# Patient Record
Sex: Male | Born: 1945 | Race: White | Hispanic: No | Marital: Married | Smoking: Former smoker
Health system: Southern US, Community
[De-identification: ages and names within clinical notes are randomized; demographics above are authoritative.]

## PROBLEM LIST (undated history)

## (undated) DIAGNOSIS — I2510 Atherosclerotic heart disease of native coronary artery without angina pectoris: Secondary | ICD-10-CM

## (undated) DIAGNOSIS — I471 Supraventricular tachycardia, unspecified: Secondary | ICD-10-CM

## (undated) DIAGNOSIS — Z9109 Other allergy status, other than to drugs and biological substances: Secondary | ICD-10-CM

## (undated) DIAGNOSIS — Z7982 Long term (current) use of aspirin: Secondary | ICD-10-CM

## (undated) DIAGNOSIS — F32A Depression, unspecified: Secondary | ICD-10-CM

## (undated) DIAGNOSIS — I10 Essential (primary) hypertension: Secondary | ICD-10-CM

## (undated) DIAGNOSIS — M51369 Other intervertebral disc degeneration, lumbar region without mention of lumbar back pain or lower extremity pain: Secondary | ICD-10-CM

## (undated) DIAGNOSIS — M1990 Unspecified osteoarthritis, unspecified site: Secondary | ICD-10-CM

## (undated) DIAGNOSIS — Z9841 Cataract extraction status, right eye: Secondary | ICD-10-CM

## (undated) DIAGNOSIS — I639 Cerebral infarction, unspecified: Secondary | ICD-10-CM

## (undated) DIAGNOSIS — K5790 Diverticulosis of intestine, part unspecified, without perforation or abscess without bleeding: Secondary | ICD-10-CM

## (undated) DIAGNOSIS — K219 Gastro-esophageal reflux disease without esophagitis: Secondary | ICD-10-CM

## (undated) DIAGNOSIS — E7800 Pure hypercholesterolemia, unspecified: Secondary | ICD-10-CM

## (undated) DIAGNOSIS — E291 Testicular hypofunction: Secondary | ICD-10-CM

## (undated) DIAGNOSIS — I5022 Chronic systolic (congestive) heart failure: Secondary | ICD-10-CM

## (undated) DIAGNOSIS — N138 Other obstructive and reflux uropathy: Secondary | ICD-10-CM

## (undated) DIAGNOSIS — E119 Type 2 diabetes mellitus without complications: Secondary | ICD-10-CM

## (undated) DIAGNOSIS — I480 Paroxysmal atrial fibrillation: Secondary | ICD-10-CM

## (undated) DIAGNOSIS — F419 Anxiety disorder, unspecified: Secondary | ICD-10-CM

## (undated) DIAGNOSIS — I428 Other cardiomyopathies: Secondary | ICD-10-CM

## (undated) DIAGNOSIS — H269 Unspecified cataract: Secondary | ICD-10-CM

## (undated) DIAGNOSIS — I499 Cardiac arrhythmia, unspecified: Secondary | ICD-10-CM

## (undated) DIAGNOSIS — A0472 Enterocolitis due to Clostridium difficile, not specified as recurrent: Secondary | ICD-10-CM

## (undated) DIAGNOSIS — J189 Pneumonia, unspecified organism: Secondary | ICD-10-CM

## (undated) DIAGNOSIS — G4700 Insomnia, unspecified: Secondary | ICD-10-CM

## (undated) DIAGNOSIS — K5792 Diverticulitis of intestine, part unspecified, without perforation or abscess without bleeding: Secondary | ICD-10-CM

## (undated) DIAGNOSIS — I4729 Other ventricular tachycardia: Secondary | ICD-10-CM

## (undated) DIAGNOSIS — E049 Nontoxic goiter, unspecified: Secondary | ICD-10-CM

## (undated) DIAGNOSIS — I6789 Other cerebrovascular disease: Secondary | ICD-10-CM

## (undated) DIAGNOSIS — I700 Atherosclerosis of aorta: Secondary | ICD-10-CM

## (undated) DIAGNOSIS — I447 Left bundle-branch block, unspecified: Secondary | ICD-10-CM

## (undated) DIAGNOSIS — F329 Major depressive disorder, single episode, unspecified: Secondary | ICD-10-CM

## (undated) DIAGNOSIS — I5020 Unspecified systolic (congestive) heart failure: Secondary | ICD-10-CM

## (undated) DIAGNOSIS — Z5189 Encounter for other specified aftercare: Secondary | ICD-10-CM

## (undated) DIAGNOSIS — I4891 Unspecified atrial fibrillation: Secondary | ICD-10-CM

## (undated) DIAGNOSIS — R112 Nausea with vomiting, unspecified: Secondary | ICD-10-CM

## (undated) DIAGNOSIS — G459 Transient cerebral ischemic attack, unspecified: Secondary | ICD-10-CM

## (undated) DIAGNOSIS — G4733 Obstructive sleep apnea (adult) (pediatric): Secondary | ICD-10-CM

## (undated) DIAGNOSIS — G4730 Sleep apnea, unspecified: Secondary | ICD-10-CM

## (undated) DIAGNOSIS — N400 Enlarged prostate without lower urinary tract symptoms: Secondary | ICD-10-CM

## (undated) DIAGNOSIS — Z9889 Other specified postprocedural states: Secondary | ICD-10-CM

## (undated) HISTORY — DX: Nontoxic goiter, unspecified: E04.9

## (undated) HISTORY — PX: NOSE SURGERY: SHX723

## (undated) HISTORY — DX: Depression, unspecified: F32.A

## (undated) HISTORY — DX: Unspecified osteoarthritis, unspecified site: M19.90

## (undated) HISTORY — DX: Pure hypercholesterolemia, unspecified: E78.00

## (undated) HISTORY — DX: Enterocolitis due to Clostridium difficile, not specified as recurrent: A04.72

## (undated) HISTORY — DX: Gastro-esophageal reflux disease without esophagitis: K21.9

## (undated) HISTORY — DX: Other cardiomyopathies: I42.8

## (undated) HISTORY — DX: Diverticulosis of intestine, part unspecified, without perforation or abscess without bleeding: K57.90

## (undated) HISTORY — DX: Transient cerebral ischemic attack, unspecified: G45.9

## (undated) HISTORY — DX: Essential (primary) hypertension: I10

## (undated) HISTORY — DX: Atherosclerotic heart disease of native coronary artery without angina pectoris: I25.10

## (undated) HISTORY — DX: Encounter for other specified aftercare: Z51.89

## (undated) HISTORY — DX: Other allergy status, other than to drugs and biological substances: Z91.09

## (undated) HISTORY — PX: EYE SURGERY: SHX253

## (undated) HISTORY — DX: Unspecified atrial fibrillation: I48.91

## (undated) HISTORY — PX: OTHER SURGICAL HISTORY: SHX169

## (undated) HISTORY — DX: Unspecified cataract: H26.9

## (undated) HISTORY — DX: Major depressive disorder, single episode, unspecified: F32.9

## (undated) HISTORY — DX: Type 2 diabetes mellitus without complications: E11.9

## (undated) HISTORY — DX: Paroxysmal atrial fibrillation: I48.0

## (undated) HISTORY — PX: SPINE SURGERY: SHX786

## (undated) HISTORY — DX: Chronic systolic (congestive) heart failure: I50.22

---

## 1973-05-12 HISTORY — PX: SEPTOPLASTY: SUR1290

## 1989-05-12 HISTORY — PX: INGUINAL HERNIA REPAIR: SUR1180

## 1998-05-12 HISTORY — PX: SHOULDER SURGERY: SHX246

## 2000-05-12 DIAGNOSIS — I480 Paroxysmal atrial fibrillation: Secondary | ICD-10-CM

## 2000-05-12 HISTORY — DX: Paroxysmal atrial fibrillation: I48.0

## 2000-05-12 HISTORY — PX: CERVICAL DISC SURGERY: SHX588

## 2004-08-26 ENCOUNTER — Ambulatory Visit: Payer: Self-pay | Admitting: Unknown Physician Specialty

## 2004-09-18 ENCOUNTER — Ambulatory Visit: Payer: Self-pay | Admitting: Unknown Physician Specialty

## 2006-09-18 ENCOUNTER — Ambulatory Visit: Payer: Self-pay | Admitting: Unknown Physician Specialty

## 2006-11-11 ENCOUNTER — Ambulatory Visit: Payer: Self-pay | Admitting: Unknown Physician Specialty

## 2006-12-01 ENCOUNTER — Inpatient Hospital Stay: Payer: Self-pay | Admitting: Unknown Physician Specialty

## 2006-12-11 HISTORY — PX: BACK SURGERY: SHX140

## 2007-10-23 ENCOUNTER — Emergency Department: Payer: Self-pay | Admitting: Emergency Medicine

## 2007-12-28 ENCOUNTER — Encounter: Admission: RE | Admit: 2007-12-28 | Discharge: 2007-12-28 | Payer: Self-pay | Admitting: Surgery

## 2008-02-02 ENCOUNTER — Ambulatory Visit: Payer: Self-pay | Admitting: Unknown Physician Specialty

## 2008-02-22 ENCOUNTER — Other Ambulatory Visit: Admission: RE | Admit: 2008-02-22 | Discharge: 2008-02-22 | Payer: Self-pay | Admitting: Interventional Radiology

## 2008-02-22 ENCOUNTER — Encounter: Admission: RE | Admit: 2008-02-22 | Discharge: 2008-02-22 | Payer: Self-pay | Admitting: Surgery

## 2008-02-22 ENCOUNTER — Encounter (INDEPENDENT_AMBULATORY_CARE_PROVIDER_SITE_OTHER): Payer: Self-pay | Admitting: Interventional Radiology

## 2008-07-15 ENCOUNTER — Ambulatory Visit: Payer: Self-pay | Admitting: Emergency Medicine

## 2009-02-27 ENCOUNTER — Encounter: Admission: RE | Admit: 2009-02-27 | Discharge: 2009-02-27 | Payer: Self-pay | Admitting: Surgery

## 2009-05-12 HISTORY — PX: CATARACT EXTRACTION: SUR2

## 2009-06-15 ENCOUNTER — Ambulatory Visit: Payer: Self-pay | Admitting: Internal Medicine

## 2010-01-25 ENCOUNTER — Ambulatory Visit: Payer: Self-pay | Admitting: Internal Medicine

## 2010-05-21 ENCOUNTER — Encounter
Admission: RE | Admit: 2010-05-21 | Discharge: 2010-05-21 | Payer: Self-pay | Source: Home / Self Care | Attending: Internal Medicine | Admitting: Internal Medicine

## 2010-06-03 ENCOUNTER — Encounter: Payer: Self-pay | Admitting: Surgery

## 2010-07-10 ENCOUNTER — Other Ambulatory Visit: Payer: Self-pay | Admitting: *Deleted

## 2010-07-25 ENCOUNTER — Other Ambulatory Visit: Payer: Self-pay | Admitting: *Deleted

## 2010-07-25 DIAGNOSIS — R911 Solitary pulmonary nodule: Secondary | ICD-10-CM

## 2010-07-31 ENCOUNTER — Ambulatory Visit
Admission: RE | Admit: 2010-07-31 | Discharge: 2010-07-31 | Disposition: A | Discharge status: Home / Self Care | Payer: Medicare Other | Source: Ambulatory Visit | Attending: *Deleted | Admitting: *Deleted

## 2010-07-31 DIAGNOSIS — R911 Solitary pulmonary nodule: Secondary | ICD-10-CM

## 2010-07-31 MED ORDER — IOHEXOL 300 MG/ML  SOLN
75.0000 mL | Freq: Once | INTRAMUSCULAR | Status: AC | PRN
  Administered 2010-07-31: 75 mL via INTRAVENOUS

## 2010-09-20 ENCOUNTER — Emergency Department: Payer: Self-pay | Admitting: Emergency Medicine

## 2010-12-31 ENCOUNTER — Ambulatory Visit: Payer: Self-pay | Admitting: Internal Medicine

## 2011-07-18 LAB — HM COLONOSCOPY

## 2011-07-21 ENCOUNTER — Ambulatory Visit: Payer: Self-pay | Admitting: Unknown Physician Specialty

## 2011-07-21 LAB — HM COLONOSCOPY

## 2011-07-22 LAB — PATHOLOGY REPORT

## 2012-04-02 ENCOUNTER — Encounter: Payer: Self-pay | Admitting: Internal Medicine

## 2012-04-02 ENCOUNTER — Ambulatory Visit (INDEPENDENT_AMBULATORY_CARE_PROVIDER_SITE_OTHER): Payer: Medicare Other | Admitting: Internal Medicine

## 2012-04-02 VITALS — BP 139/74 | HR 61 | Temp 98.2°F | Ht 68.0 in | Wt 210.0 lb

## 2012-04-02 DIAGNOSIS — G459 Transient cerebral ischemic attack, unspecified: Secondary | ICD-10-CM | POA: Insufficient documentation

## 2012-04-02 DIAGNOSIS — K219 Gastro-esophageal reflux disease without esophagitis: Secondary | ICD-10-CM | POA: Insufficient documentation

## 2012-04-02 DIAGNOSIS — M1990 Unspecified osteoarthritis, unspecified site: Secondary | ICD-10-CM

## 2012-04-02 DIAGNOSIS — I639 Cerebral infarction, unspecified: Secondary | ICD-10-CM | POA: Insufficient documentation

## 2012-04-02 DIAGNOSIS — E119 Type 2 diabetes mellitus without complications: Secondary | ICD-10-CM

## 2012-04-02 DIAGNOSIS — E049 Nontoxic goiter, unspecified: Secondary | ICD-10-CM

## 2012-04-02 DIAGNOSIS — I10 Essential (primary) hypertension: Secondary | ICD-10-CM

## 2012-04-02 DIAGNOSIS — E7800 Pure hypercholesterolemia, unspecified: Secondary | ICD-10-CM

## 2012-04-02 DIAGNOSIS — I6350 Cerebral infarction due to unspecified occlusion or stenosis of unspecified cerebral artery: Secondary | ICD-10-CM

## 2012-04-02 MED ORDER — ALPRAZOLAM 0.25 MG PO TABS: 0.2500 mg | ORAL_TABLET | Freq: Every day | ORAL | Status: DC | PRN

## 2012-04-02 NOTE — Assessment & Plan Note (Addendum)
Sees Dr Kernodle.  Currently stable.  Follow.  Just had injection in his right shoulder (Dr Barnes).  Doing better.  Follow.     

## 2012-04-02 NOTE — Progress Notes (Signed)
Subjective:    Patient ID: Ricardo Nash, male    DOB: 09-26-1945, 66 y.o.   MRN: [id number]  HPI 66 year old male with past history of diabetes, hypertension, hypercholesterolemia and previously presumed TIA who comes in today for a scheduled follow up.  States he is doing well.  Has seen Dr Zachery Dauer for his right shoulder.  Shoulder doing better.  Injection helped.  Saw Dr Lady Gary earlier this month.  Heart checked out fine.  Had his flu shot.  Just had his eyes checked.  Saw Dr Cheree Ditto and had a skin survey.  She removed an "abnormal mole from his left anterior chest. He will continue to follow up with her with yearly skin surveys.  He is exercising 4-5x/week.  Aerobic exercise and lifting weights.  Breathing is doing well.  Back doing ok.  Some minimal discomfort at times, but overall doing relatively well.  Sugars averaging 120-140 in the am and 80-100 in the pm.  Blood pressures averaging 130s/60s.   Past Medical History  Diagnosis Date  . Hypertension   . Diabetes mellitus   . Hypercholesterolemia   . Environmental allergies   . TIA (transient ischemic attack)   . Depression   . GERD (gastroesophageal reflux disease)   . Osteoarthritis     cervical spine, lumbar spine  . Paroxysmal atrial fibrillation   . C. difficile colitis   . Goiter     intrathoracic, s/p benign biopsy (Dr Gerrit Friends)  . Diverticulosis     Review of Systems Patient denies any headache, lightheadedness or dizziness.  No significant sinus symptoms currently.  Still has some drainage, but overall doing well.  No chest pain, tightness or palpitations.  No increased shortness of breath, cough or congestion.  No acid reflux.  No nausea or vomiting.  No abdominal pain or cramping.  No bowel change, such as diarrhea, constipation, BRBPR or melana.  No urine change.  Overall he feels he is doing well.     Objective:   Physical Exam Filed Vitals:   04/02/12 0805  BP: 139/74  Pulse: 61  Temp: 98.2 F (30.52 C)   66  year old male in no acute distress.   HEENT:  Nares - clear.  OP- without lesions or erythema.  NECK:  Supple, nontender.  No audible bruit.   HEART:  Appears to be regular. LUNGS:  Without crackles or wheezing audible.  Respirations even and unlabored.   RADIAL PULSE:  Equal bilaterally.  ABDOMEN:  Soft, nontender.  No audible abdominal bruit.   EXTREMITIES:  No increased edema to be present.  Feet without lesions.  DP pulses palpable and equal bilaterally.                   Assessment & Plan:  GI.  Bowels doing better on fiber.  Colonoscopy 07/21/11 revealed diverticulosis and internal hemorrhoids.  EGD with gastritis.  Upper symptoms controlled on Protonix.  Follow.   ADRENAL GLAND ENLARGEMENT.  Had a lipomatous lesion in the central portion of the left adrenal gland.  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    CARDIOVASCULAR.  Stable.  Asymptomatic.  Continue follow up with Dr. Lady Gary.  Continue risk factor modification.    GU.  Sees Dr Achilles Dunk.  He follows PSAs.    PULMONARY.  Sees Dr Meredeth Ide.  Recent CT chest revealed no acute abnormality.  Recommended yearly follow up.   INCREASED PSYCHOSOCIAL STRESSORS.  Uses Xanax occasionally.  Follow.   HEALTH MAINTENANCE.  Last physical 05/22/11.  Colonoscopy 07/21/11 - diverticulosis and internal hemorrhoids.  PSAs followed through Dr Achilles Dunk.

## 2012-04-02 NOTE — Assessment & Plan Note (Signed)
Previously saw Dr Gerkin.  Had an ultrasound.  Dr Gerkin felt - stable (actually smaller).  S/P biopsy.  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  Cytopathology showed follicular epithelial cells without atypia.  Follow.    

## 2012-04-02 NOTE — Assessment & Plan Note (Signed)
EGD revealed gastritis.  Currently on Protonix.  Symptoms controlled.   

## 2012-04-02 NOTE — Assessment & Plan Note (Addendum)
Check sugars twice a day.  Up to date with eye checks.  A1c checked 12/31/11 - 6.9.  Follow.  Check metabolic panel and a1c with next labs.

## 2012-04-02 NOTE — Patient Instructions (Addendum)
It was nice seeing you today.  I am glad you are doing well.  Let me know if you need anything.  

## 2012-04-02 NOTE — Assessment & Plan Note (Signed)
Maintained on Plavix.  No reoccurring problems.  Follow.

## 2012-04-02 NOTE — Assessment & Plan Note (Signed)
Maintained on Plavix.  Currently asymptomatic.  Follow.   

## 2012-04-03 ENCOUNTER — Encounter: Payer: Self-pay | Admitting: Internal Medicine

## 2012-04-03 NOTE — Assessment & Plan Note (Signed)
Fasting lipid profile 12/31/11 revealed total cholesterol 132, HDL 35, LDL 69 and triglycerides 145.  Liver panel wnl.  Continue low cholesterol diet and exercise.  Continue current med regimen.  Check lipid profile and liver function with next fasting labs.

## 2012-04-03 NOTE — Assessment & Plan Note (Signed)
Blood pressure is under good control.  Same meds.  Check metabolic panel with next labs.    

## 2012-05-03 ENCOUNTER — Other Ambulatory Visit: Payer: Self-pay | Admitting: Internal Medicine

## 2012-05-03 MED ORDER — ATORVASTATIN CALCIUM 10 MG PO TABS: 10.0000 mg | ORAL_TABLET | Freq: Every day | ORAL | Status: DC

## 2012-05-03 NOTE — Telephone Encounter (Signed)
Sent in to pharmacy.  

## 2012-05-03 NOTE — Telephone Encounter (Signed)
Atorvastatin Calcium 10 mg tab  Take 1 tablet by mouth each evening for cholesterol  # 30

## 2012-05-24 DIAGNOSIS — N529 Male erectile dysfunction, unspecified: Secondary | ICD-10-CM | POA: Insufficient documentation

## 2012-05-24 DIAGNOSIS — E291 Testicular hypofunction: Secondary | ICD-10-CM | POA: Insufficient documentation

## 2012-05-24 DIAGNOSIS — N401 Enlarged prostate with lower urinary tract symptoms: Secondary | ICD-10-CM | POA: Insufficient documentation

## 2012-06-01 ENCOUNTER — Other Ambulatory Visit: Payer: Self-pay | Admitting: Internal Medicine

## 2012-06-01 MED ORDER — AMLODIPINE BESYLATE 10 MG PO TABS: 10.0000 mg | ORAL_TABLET | Freq: Every day | ORAL | Status: DC

## 2012-06-01 NOTE — Telephone Encounter (Signed)
Prescription faxed to pharmacy.

## 2012-06-01 NOTE — Telephone Encounter (Signed)
amLODipine (NORVASC) 10 MG tablet  # 30

## 2012-06-14 ENCOUNTER — Telehealth: Payer: Self-pay | Admitting: Internal Medicine

## 2012-06-14 NOTE — Telephone Encounter (Signed)
Pt called he went to pharmcy to get liptor refill.  The pharmacy had note stating he needed to talk to dr scott. Pt has enough left for 2 days [facility] in [place] His cpx appointment is 2/27

## 2012-06-15 MED ORDER — ATORVASTATIN CALCIUM 10 MG PO TABS: 10.0000 mg | ORAL_TABLET | Freq: Every day | ORAL | Status: DC

## 2012-06-15 NOTE — Telephone Encounter (Signed)
I refilled lipitor. Sent it in to [facility].  Please notify pt and tell him what pharmacist said.

## 2012-06-15 NOTE — Telephone Encounter (Signed)
I sent in refill lipitor #30 with 5 refills to [facility].

## 2012-06-15 NOTE — Telephone Encounter (Signed)
Spoke to pharmacist at [facility]. He said that did not have a note for patient. However did need refills. Do you want to refill now or wait until after patient has labs?

## 2012-06-15 NOTE — Telephone Encounter (Addendum)
Called patient to let patient know.

## 2012-06-17 ENCOUNTER — Other Ambulatory Visit: Payer: Self-pay | Admitting: *Deleted

## 2012-06-18 MED ORDER — HYDROCHLOROTHIAZIDE 25 MG PO TABS: 25.0000 mg | ORAL_TABLET | Freq: Every day | ORAL | Status: DC

## 2012-06-18 MED ORDER — CLOPIDOGREL BISULFATE 75 MG PO TABS: 75.0000 mg | ORAL_TABLET | Freq: Every day | ORAL | Status: DC

## 2012-06-30 ENCOUNTER — Other Ambulatory Visit (INDEPENDENT_AMBULATORY_CARE_PROVIDER_SITE_OTHER): Payer: 59

## 2012-06-30 DIAGNOSIS — I10 Essential (primary) hypertension: Secondary | ICD-10-CM

## 2012-06-30 DIAGNOSIS — E119 Type 2 diabetes mellitus without complications: Secondary | ICD-10-CM

## 2012-06-30 DIAGNOSIS — E049 Nontoxic goiter, unspecified: Secondary | ICD-10-CM

## 2012-06-30 DIAGNOSIS — E7800 Pure hypercholesterolemia, unspecified: Secondary | ICD-10-CM

## 2012-06-30 DIAGNOSIS — G459 Transient cerebral ischemic attack, unspecified: Secondary | ICD-10-CM

## 2012-06-30 LAB — CBC WITH DIFFERENTIAL/PLATELET
Basophils Absolute: 0 10*3/uL (ref 0.0–0.1)
Eosinophils Relative: 0.8 % (ref 0.0–5.0)
MCV: 90.1 fl (ref 78.0–100.0)
Monocytes Absolute: 0.4 10*3/uL (ref 0.1–1.0)
Neutrophils Relative %: 71.7 % (ref 43.0–77.0)
Platelets: 241 10*3/uL (ref 150.0–400.0)
RDW: 13.1 % (ref 11.5–14.6)
WBC: 8.8 10*3/uL (ref 4.5–10.5)

## 2012-06-30 LAB — BASIC METABOLIC PANEL
CO2: 28 mEq/L (ref 19–32)
Calcium: 10.1 mg/dL (ref 8.4–10.5)
Creatinine, Ser: 1 mg/dL (ref 0.4–1.5)
GFR: 78.06 mL/min (ref >60.00)
Sodium: 141 mEq/L (ref 135–145)

## 2012-06-30 LAB — HEPATIC FUNCTION PANEL
Alkaline Phosphatase: 35 U/L — ABNORMAL LOW (ref 39–117)
Bilirubin, Direct: 0.2 mg/dL (ref 0.0–0.3)
Total Bilirubin: 1.2 mg/dL (ref 0.3–1.2)
Total Protein: 7 g/dL (ref 6.0–8.3)

## 2012-06-30 LAB — HEMOGLOBIN A1C: Hgb A1c MFr Bld: 7.1 % — ABNORMAL HIGH (ref 4.6–6.5)

## 2012-06-30 LAB — LIPID PANEL
Cholesterol: 129 mg/dL (ref 0–200)
HDL: 38.8 mg/dL — ABNORMAL LOW (ref >39.00)
LDL Cholesterol: 54 mg/dL (ref 0–99)
VLDL: 36.4 mg/dL (ref 0.0–40.0)

## 2012-06-30 LAB — TSH: TSH: 0.82 u[IU]/mL (ref 0.35–5.50)

## 2012-07-01 ENCOUNTER — Telehealth: Payer: Self-pay | Admitting: Internal Medicine

## 2012-07-01 NOTE — Telephone Encounter (Signed)
Pt notified of labs via my chart.  

## 2012-07-08 ENCOUNTER — Encounter: Payer: Self-pay | Admitting: Internal Medicine

## 2012-07-08 ENCOUNTER — Ambulatory Visit (INDEPENDENT_AMBULATORY_CARE_PROVIDER_SITE_OTHER): Payer: 59 | Admitting: Internal Medicine

## 2012-07-08 VITALS — BP 122/60 | HR 62 | Temp 98.0°F | Ht 68.0 in | Wt 216.2 lb

## 2012-07-08 DIAGNOSIS — G4730 Sleep apnea, unspecified: Secondary | ICD-10-CM

## 2012-07-08 MED ORDER — ALPRAZOLAM 0.25 MG PO TABS: 0.2500 mg | ORAL_TABLET | Freq: Every day | ORAL | Status: DC | PRN

## 2012-07-15 ENCOUNTER — Telehealth: Payer: Self-pay | Admitting: Internal Medicine

## 2012-07-15 MED ORDER — METOPROLOL SUCCINATE ER 25 MG PO TB24: 25.0000 mg | ORAL_TABLET | Freq: Every day | ORAL | Status: DC

## 2012-07-15 NOTE — Telephone Encounter (Signed)
Refilled metoprolol 

## 2012-07-19 ENCOUNTER — Encounter: Payer: Self-pay | Admitting: Internal Medicine

## 2012-07-19 NOTE — Assessment & Plan Note (Signed)
Blood pressure is under good control.  Same meds.  Check metabolic panel with next labs.

## 2012-07-19 NOTE — Assessment & Plan Note (Signed)
Continue CPAP.  Plan autotitration as outlined.

## 2012-07-19 NOTE — Progress Notes (Signed)
Subjective:    Patient ID: Ricardo Nash, male    DOB: 01/24/1946, 67 y.o.   MRN: [id number]  HPI 67 year old male with past history of diabetes, hypertension, hypercholesterolemia and previously presumed TIA who comes in today to follow up on these issues as well as for a complete physical exam.  States he is doing well.  Breathing is doing well.  Back doing ok.  Some minimal discomfort at times, but overall doing relatively well.  Sugars have been a little elevated recently but lately averaging 120-140 in the am and 80-110 in the pm.  Improved from previous checks.  Blood pressures averaging 130s/60s.  Some decreased hearing.  No pain.  Some left side sinus tenderness.  Dry mouth.  Notices mostly at night.  No nausea or vomiting.  No acid reflux.   Past Medical History  Diagnosis Date  . Hypertension   . Diabetes mellitus   . Hypercholesterolemia   . Environmental allergies   . TIA (transient ischemic attack)   . Depression   . GERD (gastroesophageal reflux disease)   . Osteoarthritis     cervical spine, lumbar spine  . Paroxysmal atrial fibrillation   . C. difficile colitis   . Goiter     intrathoracic, s/p benign biopsy (Dr Gerrit Friends)  . Diverticulosis     Review of Systems Patient denies any headache, lightheadedness or dizziness.  Some sinus congestion.  No chest pain, tightness or palpitations.  No increased shortness of breath, cough or congestion.  No acid reflux.  No nausea or vomiting.  No abdominal pain or cramping.  No bowel change, such as diarrhea, constipation, BRBPR or melana.  No urine change.  Overall he feels he is doing well.  Sugars doing better now.       Objective:   Physical Exam  Filed Vitals:   07/08/12 0806  BP: 122/60  Pulse: 62  Temp: 98 F (56.47 C)   67 year old male in no acute distress.  HEENT:  Nares - clear.  Oropharynx - without lesions. NECK:  Supple.  Nontender.  No audible carotid bruit.  HEART:  Appears to be regular.   LUNGS:  No  crackles or wheezing audible.  Respirations even and unlabored.   RADIAL PULSE:  Equal bilaterally.  ABDOMEN:  Soft.  Nontender.  Bowel sounds present and normal.  No audible abdominal bruit.  GU:  Performed by Dr Sharyl Nimrod.   EXTREMITIES:  No increased edema present.  DP pulses palpable and equal bilaterally.             Assessment & Plan:  GI.  Bowels doing better.  Colonoscopy 07/21/11 revealed diverticulosis and internal hemorrhoids.  EGD with gastritis.  Upper symptoms controlled on Protonix.  Follow.   ADRENAL GLAND ENLARGEMENT.  Had a lipomatous lesion in the central portion of the left adrenal gland.  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    CARDIOVASCULAR.  Stable.  Asymptomatic.  Continue follow up with Dr. Lady Gary.  Continue risk factor modification.    GU.  Sees Dr Achilles Dunk.  He follows PSAs.    PULMONARY.  Sees Dr Meredeth Ide.  Recent CT chest revealed no acute abnormality.  Recommended yearly follow up.   INCREASED PSYCHOSOCIAL STRESSORS.  Uses Xanax occasionally.  Follow.   POSSIBLE SINUSITIS.  Zpak as directed.  Saline nasal flushes.  Flonase as directed.  mucinex as directed.  Follow.   DRY MOUTH.  Is  waking up at night and has noticed increased dry mouth.  Will arrange for autotitration.  Has know sleep apnea.   HEALTH MAINTENANCE.  Physical today.  Colonoscopy 07/21/11 - diverticulosis and internal hemorrhoids.  PSAs followed through Dr Achilles Dunk.

## 2012-07-19 NOTE — Assessment & Plan Note (Signed)
Previously saw Dr Gerkin.  Had an ultrasound.  Dr Gerkin felt - stable (actually smaller).  S/P biopsy.  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  Cytopathology showed follicular epithelial cells without atypia.  Follow.    

## 2012-07-19 NOTE — Assessment & Plan Note (Signed)
Check sugars twice a day.  Up to date with eye checks.  A1c checked 06/30/12 - 7.1.  Follow.  Cr 1.0.

## 2012-07-19 NOTE — Assessment & Plan Note (Signed)
Fasting lipid profile 06/30/12 revealed total cholesterol 129, HDL 39, LDL 54 and triglycerides 825.  Liver panel wnl.  Continue low cholesterol diet and exercise.  Continue current med regimen.  Check lipid profile and liver function with next fasting labs.

## 2012-07-19 NOTE — Assessment & Plan Note (Signed)
Maintained on Plavix.  Currently asymptomatic.  Follow.   

## 2012-07-19 NOTE — Assessment & Plan Note (Signed)
EGD revealed gastritis.  Currently on Protonix.  Symptoms controlled.   

## 2012-07-19 NOTE — Assessment & Plan Note (Signed)
Sees Dr Gavin Potters.  Currently stable.  Follow.  Just had injection in his right shoulder (Dr Zachery Dauer).  Doing better.  Follow.

## 2012-07-21 ENCOUNTER — Other Ambulatory Visit: Payer: Self-pay | Admitting: *Deleted

## 2012-07-21 MED ORDER — NIACIN ER (ANTIHYPERLIPIDEMIC) 500 MG PO TBCR: EXTENDED_RELEASE_TABLET | ORAL | Status: DC

## 2012-07-21 NOTE — Telephone Encounter (Signed)
Sent in to pharmacy.  

## 2012-08-06 ENCOUNTER — Other Ambulatory Visit: Payer: Self-pay | Admitting: *Deleted

## 2012-08-06 MED ORDER — METFORMIN HCL 1000 MG PO TABS: ORAL_TABLET | ORAL | Status: DC

## 2012-08-06 MED ORDER — PIOGLITAZONE HCL 30 MG PO TABS: 30.0000 mg | ORAL_TABLET | Freq: Every day | ORAL | Status: DC

## 2012-08-06 MED ORDER — GLIPIZIDE ER 2.5 MG PO TB24: 2.5000 mg | ORAL_TABLET | Freq: Every day | ORAL | Status: DC

## 2012-08-06 NOTE — Telephone Encounter (Signed)
Med filled.  

## 2012-08-07 ENCOUNTER — Telehealth: Payer: Self-pay | Admitting: Internal Medicine

## 2012-08-07 MED ORDER — GLIPIZIDE ER 2.5 MG PO TB24: ORAL_TABLET | ORAL | Status: DC

## 2012-08-07 NOTE — Telephone Encounter (Signed)
Refilled glipizide 2.5mg  bid #60 with 5 refills

## 2012-08-09 ENCOUNTER — Telehealth: Payer: Self-pay | Admitting: *Deleted

## 2012-08-09 NOTE — Telephone Encounter (Signed)
Patient had concerns about Dr. Lorin Picket decreasing his dosage of glipizide. Please call.

## 2012-08-10 MED ORDER — GLIPIZIDE ER 2.5 MG PO TB24: 2.5000 mg | ORAL_TABLET | Freq: Three times a day (TID) | ORAL | Status: DC

## 2012-08-10 NOTE — Telephone Encounter (Signed)
Spoke to patient and glipizide should be 3 in the am. Resent with correct directions.

## 2012-08-12 ENCOUNTER — Telehealth: Payer: Self-pay | Admitting: Internal Medicine

## 2012-08-12 NOTE — Telephone Encounter (Signed)
Pharmacy notified of changes

## 2012-08-12 NOTE — Telephone Encounter (Signed)
Make sure pharmacy knows the directions for Glipizide are three tablets q am.  Confirm with pt this is how he is taking.  If so, please change on med list.  The order came through as one tid - not three tablets q am.  Thanks.

## 2012-08-24 ENCOUNTER — Encounter: Payer: Self-pay | Admitting: Internal Medicine

## 2012-09-10 ENCOUNTER — Other Ambulatory Visit: Payer: Self-pay | Admitting: *Deleted

## 2012-09-10 ENCOUNTER — Telehealth: Payer: Self-pay | Admitting: Internal Medicine

## 2012-09-10 MED ORDER — LISINOPRIL 40 MG PO TABS: 40.0000 mg | ORAL_TABLET | Freq: Every day | ORAL | Status: DC

## 2012-09-10 NOTE — Telephone Encounter (Signed)
Lisinopril already refilled, checked with [facility] to confirm. Left message for pt notifying him refill is at pharmacy.

## 2012-09-10 NOTE — Telephone Encounter (Signed)
Pt called to see if he could get a refill on his lisipril  He stated he called his pharamcy and they stated he didn't have any refills [facility] in [place] Pt is completely out

## 2012-09-27 ENCOUNTER — Other Ambulatory Visit: Payer: Medicare Other

## 2012-11-02 ENCOUNTER — Ambulatory Visit (INDEPENDENT_AMBULATORY_CARE_PROVIDER_SITE_OTHER): Payer: 59 | Admitting: Internal Medicine

## 2012-11-02 ENCOUNTER — Encounter: Payer: Self-pay | Admitting: Internal Medicine

## 2012-11-02 ENCOUNTER — Ambulatory Visit: Payer: 59 | Admitting: Internal Medicine

## 2012-11-02 VITALS — BP 130/70 | HR 62 | Temp 98.5°F | Ht 68.0 in | Wt 219.5 lb

## 2012-11-02 DIAGNOSIS — K219 Gastro-esophageal reflux disease without esophagitis: Secondary | ICD-10-CM

## 2012-11-02 DIAGNOSIS — E119 Type 2 diabetes mellitus without complications: Secondary | ICD-10-CM

## 2012-11-02 DIAGNOSIS — E049 Nontoxic goiter, unspecified: Secondary | ICD-10-CM

## 2012-11-02 DIAGNOSIS — G459 Transient cerebral ischemic attack, unspecified: Secondary | ICD-10-CM

## 2012-11-02 DIAGNOSIS — I10 Essential (primary) hypertension: Secondary | ICD-10-CM

## 2012-11-02 DIAGNOSIS — G4733 Obstructive sleep apnea (adult) (pediatric): Secondary | ICD-10-CM

## 2012-11-02 DIAGNOSIS — M1990 Unspecified osteoarthritis, unspecified site: Secondary | ICD-10-CM

## 2012-11-02 DIAGNOSIS — E7800 Pure hypercholesterolemia, unspecified: Secondary | ICD-10-CM

## 2012-11-02 MED ORDER — ALPRAZOLAM 0.25 MG PO TABS: 0.2500 mg | ORAL_TABLET | Freq: Every day | ORAL | Status: DC | PRN

## 2012-11-08 ENCOUNTER — Encounter: Payer: Self-pay | Admitting: Internal Medicine

## 2012-11-08 NOTE — Assessment & Plan Note (Signed)
Sees Dr [facility]le.  Currently stable.  Follow.    

## 2012-11-08 NOTE — Assessment & Plan Note (Signed)
Check sugars twice a day.  Up to date with eye checks.  Follow met b and a1c.

## 2012-11-08 NOTE — Assessment & Plan Note (Signed)
Previously saw Dr Gerkin.  Had an ultrasound.  Dr Gerkin felt - stable (actually smaller).  S/P biopsy.  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  Cytopathology showed follicular epithelial cells without atypia.  Follow.    

## 2012-11-08 NOTE — Assessment & Plan Note (Signed)
Continue CPAP.  Plan autotitration as outlined.

## 2012-11-08 NOTE — Assessment & Plan Note (Signed)
Continue low cholesterol diet and exercise.  Continue current med regimen.  Check lipid profile and liver function with next fasting labs.

## 2012-11-08 NOTE — Progress Notes (Signed)
Subjective:    Patient ID: Ricardo Nash, male    DOB: 1946/03/29, 67 y.o.   MRN: [id number]  HPI 67 year old male with past history of diabetes, hypertension, hypercholesterolemia and previously presumed TIA who comes in today for a scheduled follow up.   States he has been doing well.  Breathing is doing well.  Sugars as outlined.  Blood pressures averaging 130s/60s.  No nausea or vomiting.  No acid reflux.  Saw Dr Lady Gary recently.  EKG ok.  Has follow up with Fransico Setters this week.  Occasional diarrhea - minimal.  Off align and benefiber.  Bowels better.  He does report that he fell in 3/14.  Was on the second step of a ladder and fell backwards.  Had some pain in his back and down his leg.  Has been seeing a chiropractor for the last month.  Leg is better.  Minimal shoulder pain.  Pain has improved.  Discussed taking tylenol.     Past Medical History  Diagnosis Date  . Hypertension   . Diabetes mellitus   . Hypercholesterolemia   . Environmental allergies   . TIA (transient ischemic attack)   . Depression   . GERD (gastroesophageal reflux disease)   . Osteoarthritis     cervical spine, lumbar spine  . Paroxysmal atrial fibrillation   . C. difficile colitis   . Goiter     intrathoracic, s/p benign biopsy (Dr Gerrit Friends)  . Diverticulosis     Current Outpatient Prescriptions on File Prior to Visit  Medication Sig Dispense Refill  . amLODipine (NORVASC) 10 MG tablet Take 1 tablet (10 mg total) by mouth daily.  30 tablet  5  . aspirin 81 MG tablet Take 81 mg by mouth daily.      Marland Kitchen atorvastatin (LIPITOR) 10 MG tablet Take 1 tablet (10 mg total) by mouth daily.  30 tablet  5  . cetirizine (ZYRTEC) 10 MG tablet Take 10 mg by mouth daily.      . clopidogrel (PLAVIX) 75 MG tablet Take 1 tablet (75 mg total) by mouth daily.  90 tablet  3  . ferrous sulfate dried (SLOW FE) 160 (50 FE) MG TBCR Take 160 mg by mouth daily.      . fish oil-omega-3 fatty acids 1000 MG capsule 1 g. 4 capsules daily       . fluticasone (FLONASE) 50 MCG/ACT nasal spray Place 2 sprays into the nose daily.      Marland Kitchen glipiZIDE (GLUCOTROL XL) 2.5 MG 24 hr tablet Take 3 tablets by mouth every morning      . hydrochlorothiazide (HYDRODIURIL) 25 MG tablet Take 1 tablet (25 mg total) by mouth daily.  90 tablet  3  . lisinopril (PRINIVIL,ZESTRIL) 40 MG tablet Take 1 tablet (40 mg total) by mouth daily.  30 tablet  5  . metFORMIN (GLUCOPHAGE) 1000 MG tablet 1 table q am and 1 1/2 tablet q pm  75 tablet  6  . metoprolol succinate (TOPROL-XL) 25 MG 24 hr tablet Take 1 tablet (25 mg total) by mouth daily.  30 tablet  5  . Multiple Vitamin (MULTIVITAMIN) tablet Take 1 tablet by mouth daily.      . niacin (NIASPAN) 500 MG CR tablet 3 tablets q day  120 tablet  5  . Omega-3 Fatty Acids (FISH OIL) 1200 MG CAPS Take by mouth 4 (four) times daily.      . pantoprazole (PROTONIX) 40 MG tablet Take 40 mg by mouth daily.      Marland Kitchen  pioglitazone (ACTOS) 30 MG tablet Take 1 tablet (30 mg total) by mouth daily.  30 tablet  6  . sucralfate (CARAFATE) 1 G tablet Take 1 g by mouth 2 (two) times daily.       No current facility-administered medications on file prior to visit.    Review of Systems Patient denies any headache, lightheadedness or dizziness.  No allergy or sinus symptoms.  No chest pain, tightness or palpitations.  No increased shortness of breath, cough or congestion.  No acid reflux.  No nausea or vomiting.  No abdominal pain or cramping.  No bowel change, such as constipation, BRBPR or melana.  Minimal diarrhea.   No urine change.  Overall he feels he is doing well.  Sugars as outlined.  Back and leg pain as outlined.  Better.       Objective:   Physical Exam  Filed Vitals:   11/02/12 0948  BP: 130/70  Pulse: 62  Temp: 98.5 F (59.33 C)   67 year old male in no acute distress.  HEENT:  Nares - clear.  Oropharynx - without lesions. NECK:  Supple.  Nontender.  No audible carotid bruit.  HEART:  Appears to be regular.    LUNGS:  No crackles or wheezing audible.  Respirations even and unlabored.   RADIAL PULSE:  Equal bilaterally.  ABDOMEN:  Soft.  Nontender.  Bowel sounds present and normal.  No audible abdominal bruit.    EXTREMITIES:  No increased edema present.  DP pulses palpable and equal bilaterally.             Assessment & Plan:  GI.  Bowels doing better.  Colonoscopy 07/21/11 revealed diverticulosis and internal hemorrhoids.  EGD with gastritis.  Upper symptoms controlled on Protonix.  Follow.   MSK.  Back and leg pain as outlined.  Better.  Discussed tylenol and gentle exercises.  Follow.  Will notify me if symptoms persist or worsen.    ADRENAL GLAND ENLARGEMENT.  Had a lipomatous lesion in the central portion of the left adrenal gland.  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    CARDIOVASCULAR.  Stable.  Asymptomatic.  Continue follow up with Dr. Lady Gary.  Continue risk factor modification.    GU.  Sees Dr Achilles Dunk.  He follows PSAs.    PULMONARY.  Sees Dr Meredeth Ide.  Recent CT chest revealed no acute abnormality.  Recommended yearly follow up.   INCREASED PSYCHOSOCIAL STRESSORS.  Uses Xanax occasionally.  Follow.   HEALTH MAINTENANCE.  Physical 07/08/12.  Colonoscopy 07/21/11 - diverticulosis and internal hemorrhoids.  PSAs followed through Dr Achilles Dunk.

## 2012-11-08 NOTE — Assessment & Plan Note (Signed)
Maintained on Plavix.  Currently asymptomatic.  Follow.   

## 2012-11-08 NOTE — Assessment & Plan Note (Signed)
Blood pressure is under good control.  Same meds.  Check metabolic panel with next labs.

## 2012-11-08 NOTE — Assessment & Plan Note (Signed)
EGD revealed gastritis.  Currently on Protonix.  Symptoms controlled.   

## 2012-11-17 ENCOUNTER — Other Ambulatory Visit (INDEPENDENT_AMBULATORY_CARE_PROVIDER_SITE_OTHER): Payer: 59

## 2012-11-17 DIAGNOSIS — E119 Type 2 diabetes mellitus without complications: Secondary | ICD-10-CM

## 2012-11-17 DIAGNOSIS — E7800 Pure hypercholesterolemia, unspecified: Secondary | ICD-10-CM

## 2012-11-17 LAB — HEPATIC FUNCTION PANEL
AST: 25 U/L (ref 0–37)
Alkaline Phosphatase: 31 U/L — ABNORMAL LOW (ref 39–117)
Total Bilirubin: 1.1 mg/dL (ref 0.3–1.2)

## 2012-11-17 LAB — BASIC METABOLIC PANEL
BUN: 19 mg/dL (ref 6–23)
CO2: 33 mEq/L — ABNORMAL HIGH (ref 19–32)
Chloride: 100 mEq/L (ref 96–112)
Creatinine, Ser: 0.9 mg/dL (ref 0.4–1.5)

## 2012-11-17 LAB — LIPID PANEL: Total CHOL/HDL Ratio: 3

## 2012-11-17 LAB — HEMOGLOBIN A1C: Hgb A1c MFr Bld: 6.5 % (ref 4.6–6.5)

## 2012-11-18 ENCOUNTER — Encounter: Payer: Self-pay | Admitting: Internal Medicine

## 2012-12-15 ENCOUNTER — Encounter: Payer: Self-pay | Admitting: *Deleted

## 2012-12-15 ENCOUNTER — Other Ambulatory Visit: Payer: Self-pay

## 2012-12-21 ENCOUNTER — Other Ambulatory Visit: Payer: Self-pay | Admitting: *Deleted

## 2012-12-21 ENCOUNTER — Encounter: Payer: Self-pay | Admitting: *Deleted

## 2012-12-21 MED ORDER — ALPRAZOLAM 0.25 MG PO TABS: 0.2500 mg | ORAL_TABLET | Freq: Every day | ORAL | Status: DC | PRN

## 2012-12-21 MED ORDER — NIACIN ER (ANTIHYPERLIPIDEMIC) 500 MG PO TBCR: EXTENDED_RELEASE_TABLET | ORAL | Status: DC

## 2012-12-21 NOTE — Telephone Encounter (Signed)
refilled xanax #30 with one refill and niaspan #90 with 5 refills.

## 2012-12-29 ENCOUNTER — Encounter: Payer: Self-pay | Admitting: *Deleted

## 2013-01-05 ENCOUNTER — Telehealth: Payer: Self-pay | Admitting: Internal Medicine

## 2013-01-05 MED ORDER — AMLODIPINE BESYLATE 10 MG PO TABS: 10.0000 mg | ORAL_TABLET | Freq: Every day | ORAL | Status: DC

## 2013-01-05 NOTE — Telephone Encounter (Signed)
eRxd

## 2013-01-05 NOTE — Telephone Encounter (Signed)
Patient called and stated they will leaving to go out of town tomorrow. He is wanting a refill on his amLODipine (NORVASC) 10 MG tablet as he will be out of it. Can we please take care of this for him today? Please advise

## 2013-01-06 ENCOUNTER — Other Ambulatory Visit: Payer: Self-pay | Admitting: *Deleted

## 2013-01-07 MED ORDER — ATORVASTATIN CALCIUM 10 MG PO TABS: 10.0000 mg | ORAL_TABLET | Freq: Every day | ORAL | Status: DC

## 2013-01-11 ENCOUNTER — Telehealth: Payer: Self-pay | Admitting: Internal Medicine

## 2013-01-11 MED ORDER — ACCU-CHEK AVIVA DEVI: Status: AC

## 2013-01-11 NOTE — Telephone Encounter (Signed)
Called in RX for new machine to [facility]

## 2013-01-11 NOTE — Telephone Encounter (Signed)
Patient called in and states that his blood sugar machine isn't working properly. He uses [facility] he checked with them this morning and they asked that we call in a prescription to  [facility] in [place]. Please advise patient once this has been done. The reason why he thinks his current one is broke it keeps giving him the same reading every time.

## 2013-01-14 ENCOUNTER — Ambulatory Visit (INDEPENDENT_AMBULATORY_CARE_PROVIDER_SITE_OTHER): Payer: 59 | Admitting: *Deleted

## 2013-01-14 DIAGNOSIS — Z23 Encounter for immunization: Secondary | ICD-10-CM

## 2013-01-20 ENCOUNTER — Telehealth: Payer: Self-pay | Admitting: *Deleted

## 2013-01-20 DIAGNOSIS — M25519 Pain in unspecified shoulder: Secondary | ICD-10-CM

## 2013-01-20 NOTE — Telephone Encounter (Signed)
Order placed for referral.  Amber should be calling.

## 2013-01-20 NOTE — Telephone Encounter (Signed)
Pt states that he is still having shoulder problems & we were going to refer him to see Dr. Saverio Danker. It's been a week now & he hasn't heard anything.

## 2013-02-07 ENCOUNTER — Other Ambulatory Visit: Payer: Self-pay | Admitting: *Deleted

## 2013-02-07 MED ORDER — METOPROLOL SUCCINATE ER 25 MG PO TB24: 25.0000 mg | ORAL_TABLET | Freq: Every day | ORAL | Status: DC

## 2013-03-04 ENCOUNTER — Other Ambulatory Visit: Payer: Self-pay | Admitting: *Deleted

## 2013-03-04 MED ORDER — GLIPIZIDE ER 2.5 MG PO TB24: 2.5000 mg | ORAL_TABLET | Freq: Every day | ORAL | Status: DC

## 2013-03-04 MED ORDER — LISINOPRIL 40 MG PO TABS: 40.0000 mg | ORAL_TABLET | Freq: Every day | ORAL | Status: DC

## 2013-03-04 MED ORDER — PIOGLITAZONE HCL 30 MG PO TABS: 30.0000 mg | ORAL_TABLET | Freq: Every day | ORAL | Status: DC

## 2013-03-04 NOTE — Addendum Note (Signed)
Addended by: Dennie Bible on: 03/04/2013 03:43 PM   Modules accepted: Orders

## 2013-03-09 ENCOUNTER — Encounter: Payer: Self-pay | Admitting: *Deleted

## 2013-03-10 ENCOUNTER — Encounter: Payer: Self-pay | Admitting: Internal Medicine

## 2013-03-10 ENCOUNTER — Ambulatory Visit (INDEPENDENT_AMBULATORY_CARE_PROVIDER_SITE_OTHER): Payer: Medicare Other | Admitting: Internal Medicine

## 2013-03-10 VITALS — BP 120/70 | HR 52 | Temp 98.4°F | Ht 68.0 in | Wt 219.0 lb

## 2013-03-10 DIAGNOSIS — E7800 Pure hypercholesterolemia, unspecified: Secondary | ICD-10-CM

## 2013-03-10 DIAGNOSIS — G4733 Obstructive sleep apnea (adult) (pediatric): Secondary | ICD-10-CM

## 2013-03-10 DIAGNOSIS — M1990 Unspecified osteoarthritis, unspecified site: Secondary | ICD-10-CM

## 2013-03-10 DIAGNOSIS — Z23 Encounter for immunization: Secondary | ICD-10-CM

## 2013-03-10 DIAGNOSIS — E119 Type 2 diabetes mellitus without complications: Secondary | ICD-10-CM

## 2013-03-10 DIAGNOSIS — I10 Essential (primary) hypertension: Secondary | ICD-10-CM

## 2013-03-10 DIAGNOSIS — G459 Transient cerebral ischemic attack, unspecified: Secondary | ICD-10-CM

## 2013-03-10 DIAGNOSIS — K219 Gastro-esophageal reflux disease without esophagitis: Secondary | ICD-10-CM

## 2013-03-10 DIAGNOSIS — E049 Nontoxic goiter, unspecified: Secondary | ICD-10-CM

## 2013-03-10 LAB — HEPATIC FUNCTION PANEL
Albumin: 4 g/dL (ref 3.5–5.2)
Alkaline Phosphatase: 32 U/L — ABNORMAL LOW (ref 39–117)
Total Protein: 6.9 g/dL (ref 6.0–8.3)

## 2013-03-10 LAB — CBC WITH DIFFERENTIAL/PLATELET
Basophils Relative: 0.7 % (ref 0.0–3.0)
Eosinophils Relative: 0.6 % (ref 0.0–5.0)
Lymphocytes Relative: 25 % (ref 12.0–46.0)
Monocytes Absolute: 0.4 10*3/uL (ref 0.1–1.0)
Monocytes Relative: 4.9 % (ref 3.0–12.0)
Neutrophils Relative %: 68.8 % (ref 43.0–77.0)
Platelets: 296 10*3/uL (ref 150.0–400.0)
RBC: 4.79 Mil/uL (ref 4.22–5.81)
WBC: 7.7 10*3/uL (ref 4.5–10.5)

## 2013-03-10 LAB — MICROALBUMIN / CREATININE URINE RATIO
Creatinine,U: 128 mg/dL
Microalb Creat Ratio: 1.3 mg/g (ref 0.0–30.0)
Microalb, Ur: 1.6 mg/dL (ref 0.0–1.9)

## 2013-03-10 LAB — TSH: TSH: 0.95 u[IU]/mL (ref 0.35–5.50)

## 2013-03-10 LAB — LIPID PANEL
Cholesterol: 168 mg/dL (ref 0–200)
HDL: 39.9 mg/dL (ref >39.00)
Total CHOL/HDL Ratio: 4
Triglycerides: 230 mg/dL — ABNORMAL HIGH (ref 0.0–149.0)
VLDL: 46 mg/dL — ABNORMAL HIGH (ref 0.0–40.0)

## 2013-03-10 LAB — BASIC METABOLIC PANEL
CO2: 30 mEq/L (ref 19–32)
Chloride: 101 mEq/L (ref 96–112)
Creatinine, Ser: 1 mg/dL (ref 0.4–1.5)
Glucose, Bld: 127 mg/dL — ABNORMAL HIGH (ref 70–99)
Potassium: 4.7 mEq/L (ref 3.5–5.1)

## 2013-03-10 LAB — HEMOGLOBIN A1C: Hgb A1c MFr Bld: 6.8 % — ABNORMAL HIGH (ref 4.6–6.5)

## 2013-03-10 MED ORDER — GLIPIZIDE ER 2.5 MG PO TB24: ORAL_TABLET | ORAL | Status: DC

## 2013-03-10 MED ORDER — PIOGLITAZONE HCL 30 MG PO TABS: 30.0000 mg | ORAL_TABLET | Freq: Every day | ORAL | Status: DC

## 2013-03-10 MED ORDER — METFORMIN HCL 1000 MG PO TABS: ORAL_TABLET | ORAL | Status: DC

## 2013-03-10 MED ORDER — HYDROCODONE-ACETAMINOPHEN 5-325 MG PO TABS: 1.0000 | ORAL_TABLET | Freq: Three times a day (TID) | ORAL | Status: DC | PRN

## 2013-03-10 NOTE — Assessment & Plan Note (Signed)
Maintained on Plavix.  Currently asymptomatic.  Follow.   

## 2013-03-10 NOTE — Progress Notes (Signed)
Subjective:    Patient ID: Ricardo Nash, male    DOB: 03/14/1946, 67 y.o.   MRN: [id number]  HPI 67 year old male with past history of diabetes, hypertension, hypercholesterolemia and previously presumed TIA who comes in today for a scheduled follow up.   States he has been doing well.  Breathing is doing well.  Sugars attached.  Improved.  Blood pressures doing well.  See attached list for details.  No nausea or vomiting.  No acid reflux.  Sees Dr Lady Gary.   Stays active.  No cardiac symptoms with increased activity or exertion.  Bowels better.  He does report that he fell in 3/14.  Was on the second step of a ladder and fell backwards.  Had some pain in his back and down his leg.  Saw a Land.  Leg is better.  Still some issues with his back.  Going out of the country soon and needs a little something stronger for pain.  Unable to take antiinflammatories.  Tylenol not helping.  Ultram does not work.  Has taken hydrocodone previously and tolerates this well.  Overall he feels he is doing well.     Past Medical History  Diagnosis Date  . Hypertension   . Diabetes mellitus   . Hypercholesterolemia   . Environmental allergies   . TIA (transient ischemic attack)   . Depression   . GERD (gastroesophageal reflux disease)   . Osteoarthritis     cervical spine, lumbar spine  . Paroxysmal atrial fibrillation   . C. difficile colitis   . Goiter     intrathoracic, s/p benign biopsy (Dr Gerrit Friends)  . Diverticulosis     Current Outpatient Prescriptions on File Prior to Visit  Medication Sig Dispense Refill  . ALPRAZolam (XANAX) 0.25 MG tablet Take 1 tablet (0.25 mg total) by mouth daily as needed.  30 tablet  1  . amLODipine (NORVASC) 10 MG tablet Take 1 tablet (10 mg total) by mouth daily.  30 tablet  5  . aspirin 81 MG tablet Take 81 mg by mouth daily.      Marland Kitchen atorvastatin (LIPITOR) 10 MG tablet Take 1 tablet (10 mg total) by mouth daily.  30 tablet  5  . Blood Glucose Monitoring Suppl  (ACCU-CHEK AVIVA) device Use as instructed  1 each  0  . cetirizine (ZYRTEC) 10 MG tablet Take 10 mg by mouth daily.      . clopidogrel (PLAVIX) 75 MG tablet Take 1 tablet (75 mg total) by mouth daily.  90 tablet  3  . ferrous sulfate dried (SLOW FE) 160 (50 FE) MG TBCR Take 160 mg by mouth daily.      . fluticasone (FLONASE) 50 MCG/ACT nasal spray Place 2 sprays into the nose daily.      Marland Kitchen glipiZIDE (GLUCOTROL XL) 2.5 MG 24 hr tablet Take 1 tablet (2.5 mg total) by mouth daily. Take 3 tablets by mouth every morning  90 tablet  3  . hydrochlorothiazide (HYDRODIURIL) 25 MG tablet Take 1 tablet (25 mg total) by mouth daily.  90 tablet  3  . lisinopril (PRINIVIL,ZESTRIL) 40 MG tablet Take 1 tablet (40 mg total) by mouth daily.  30 tablet  5  . metFORMIN (GLUCOPHAGE) 1000 MG tablet 1 table q am and 1 1/2 tablet q pm  75 tablet  6  . metoprolol succinate (TOPROL-XL) 25 MG 24 hr tablet Take 1 tablet (25 mg total) by mouth daily.  30 tablet  5  . Multiple  Vitamin (MULTIVITAMIN) tablet Take 1 tablet by mouth daily.      . Omega-3 Fatty Acids (FISH OIL) 1200 MG CAPS Take by mouth 4 (four) times daily.      . pantoprazole (PROTONIX) 40 MG tablet Take 40 mg by mouth daily.      . pioglitazone (ACTOS) 30 MG tablet Take 1 tablet (30 mg total) by mouth daily.  30 tablet  6  . niacin (NIASPAN) 500 MG CR tablet 3 tablets q day  90 tablet  5   No current facility-administered medications on file prior to visit.    Review of Systems Patient denies any headache, lightheadedness or dizziness.  No allergy or sinus symptoms.  No chest pain, tightness or palpitations.  No increased shortness of breath, cough or congestion.  No acid reflux.  No nausea or vomiting.  No abdominal pain or cramping.  No bowel change, such as constipation, BRBPR or melana.  Bowels better.  No urine change.  Overall he feels he is doing well.  Sugars as outlined.  Back and leg pain as outlined.  Better.  Still some back issues.  See above.   Right shoulder better s/p injection by Dr Gavin Potters.  Had to stop his niaspan.  Increased problems with flushing.        Objective:   Physical Exam  Filed Vitals:   03/10/13 0801  BP: 120/70  Pulse: 52  Temp: 98.4 F (73.72 C)   67 year old male in no acute distress.  HEENT:  Nares - clear.  Oropharynx - without lesions. NECK:  Supple.  Nontender.  No audible carotid bruit.  HEART:  Appears to be regular.   LUNGS:  No crackles or wheezing audible.  Respirations even and unlabored.   RADIAL PULSE:  Equal bilaterally.  ABDOMEN:  Soft.  Nontender.  Bowel sounds present and normal.  No audible abdominal bruit.    EXTREMITIES:  No increased edema present.  DP pulses palpable and equal bilaterally.  FEET:  Without lesions.              Assessment & Plan:  GI.  Bowels doing better.  Colonoscopy 07/21/11 revealed diverticulosis and internal hemorrhoids.  EGD with gastritis.  Upper symptoms controlled on Protonix.  Follow.   MSK.  Back and leg pain as outlined.  Leg better.  Still with some back issues.  Unable to take antiinflammatories.  Tramadol did not help.  Tylenol not helping.  Has taken hydrocodone previously.  Tolerated.  Request to have some hydrocodone to take on his trip.  Follow.    ADRENAL GLAND ENLARGEMENT.  Had a lipomatous lesion in the central portion of the left adrenal gland.  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    CARDIOVASCULAR.  Stable.  Asymptomatic.  Continue follow up with Dr. Lady Gary.  Continue risk factor modification.    GU.  Sees Dr Achilles Dunk.  He follows PSAs.    PULMONARY.  Sees Dr Meredeth Ide.  Recent CT chest revealed no acute abnormality.  Recommended yearly follow up.   INCREASED PSYCHOSOCIAL STRESSORS.  Uses Xanax occasionally.  Follow.   HEALTH MAINTENANCE.  Physical 07/08/12.  Colonoscopy 07/21/11 - diverticulosis and internal hemorrhoids.  PSAs followed through Dr Achilles Dunk.

## 2013-03-11 ENCOUNTER — Encounter: Payer: Self-pay | Admitting: Internal Medicine

## 2013-03-11 ENCOUNTER — Telehealth: Payer: Self-pay | Admitting: Internal Medicine

## 2013-03-11 NOTE — Telephone Encounter (Signed)
Pt notified of lab results via my chart.  Needs a f/u non fasting lab within the next few weeks.  Please contact him with an appt date and time.  Thanks.

## 2013-03-11 NOTE — Telephone Encounter (Signed)
Left message for pt to call office

## 2013-03-13 ENCOUNTER — Encounter: Payer: Self-pay | Admitting: Internal Medicine

## 2013-03-13 NOTE — Assessment & Plan Note (Signed)
Sees Dr [facility]le.  Currently stable.  Follow.    

## 2013-03-13 NOTE — Assessment & Plan Note (Signed)
Continue CPAP.  

## 2013-03-13 NOTE — Assessment & Plan Note (Signed)
Check sugars twice a day.  Up to date with eye checks.  Follow met b and a1c.  Sugars doing well.  See attached list.

## 2013-03-13 NOTE — Assessment & Plan Note (Addendum)
Continue low cholesterol diet and exercise.  Continue current med regimen.  Follow lipid profile and liver function.  Off niaspan due to flushing.  Follow.   

## 2013-03-13 NOTE — Assessment & Plan Note (Signed)
Blood pressure is under good control.  Same meds.  Follow metabolic panel.   

## 2013-03-13 NOTE — Assessment & Plan Note (Signed)
EGD revealed gastritis.  Currently on Protonix.  Symptoms controlled.   

## 2013-03-13 NOTE — Assessment & Plan Note (Signed)
Previously saw Dr Gerkin.  Had an ultrasound.  Dr Gerkin felt - stable (actually smaller).  S/P biopsy.  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  Cytopathology showed follicular epithelial cells without atypia.  Follow.    

## 2013-03-15 NOTE — Telephone Encounter (Signed)
Appointment nov 5

## 2013-03-16 ENCOUNTER — Other Ambulatory Visit (INDEPENDENT_AMBULATORY_CARE_PROVIDER_SITE_OTHER): Payer: 59

## 2013-03-16 ENCOUNTER — Encounter: Payer: Self-pay | Admitting: Internal Medicine

## 2013-03-16 DIAGNOSIS — R17 Unspecified jaundice: Secondary | ICD-10-CM

## 2013-03-16 LAB — HEPATIC FUNCTION PANEL
ALT: 23 U/L (ref 0–53)
Albumin: 3.8 g/dL (ref 3.5–5.2)
Bilirubin, Direct: 0.1 mg/dL (ref 0.0–0.3)
Total Protein: 7.1 g/dL (ref 6.0–8.3)

## 2013-03-28 ENCOUNTER — Encounter: Payer: Self-pay | Admitting: Internal Medicine

## 2013-04-18 ENCOUNTER — Other Ambulatory Visit: Payer: Self-pay | Admitting: *Deleted

## 2013-04-18 MED ORDER — HYDROCHLOROTHIAZIDE 25 MG PO TABS: 25.0000 mg | ORAL_TABLET | Freq: Every day | ORAL | Status: DC

## 2013-05-11 ENCOUNTER — Encounter: Payer: Self-pay | Admitting: Internal Medicine

## 2013-05-11 ENCOUNTER — Ambulatory Visit (INDEPENDENT_AMBULATORY_CARE_PROVIDER_SITE_OTHER): Payer: 59 | Admitting: Internal Medicine

## 2013-05-11 VITALS — BP 150/70 | HR 75 | Temp 98.1°F | Resp 12 | Wt 220.0 lb

## 2013-05-11 DIAGNOSIS — J0100 Acute maxillary sinusitis, unspecified: Secondary | ICD-10-CM

## 2013-05-11 MED ORDER — HYDROCOD POLST-CHLORPHEN POLST 10-8 MG/5ML PO LQCR: 5.0000 mL | Freq: Every evening | ORAL | Status: DC | PRN

## 2013-05-11 MED ORDER — LEVOFLOXACIN 500 MG PO TABS: 500.0000 mg | ORAL_TABLET | Freq: Every day | ORAL | Status: DC

## 2013-05-11 MED ORDER — METHYLPREDNISOLONE ACETATE 40 MG/ML IJ SUSP: 40.0000 mg | Freq: Once | INTRAMUSCULAR | Status: DC

## 2013-05-11 MED ORDER — BENZONATATE 200 MG PO CAPS: 200.0000 mg | ORAL_CAPSULE | Freq: Three times a day (TID) | ORAL | Status: DC | PRN

## 2013-05-11 MED ORDER — PREDNISONE (PAK) 10 MG PO TABS: ORAL_TABLET | ORAL | Status: DC

## 2013-05-11 NOTE — Progress Notes (Signed)
Pre visit review using our clinic review tool, if applicable. No additional management support is needed unless otherwise documented below in the visit note. 

## 2013-05-11 NOTE — Progress Notes (Signed)
Patient ID: Ricardo Nash, male   DOB: 1945/08/22, 67 y.o.   MRN: [id number]   Patient Active Problem List   Diagnosis Date Noted  . Sinusitis, acute maxillary 05/11/2013  . Obstructive sleep apnea 07/19/2012  . Diabetes mellitus 04/02/2012  . Hypertension 04/02/2012  . Hypercholesterolemia 04/02/2012  . GERD (gastroesophageal reflux disease) 04/02/2012  . Intrathoracic goiter 04/02/2012  . Osteoarthritis 04/02/2012  . TIA (transient ischemic attack) 04/02/2012    Subjective:  CC:   Chief Complaint  Patient presents with  . Acute Visit    productive yellow cough , sinus pressure x 1wk pt states getting worse, taking mucinex, robittusen - no improvement.     HPI:   Ricardo Arrona Lovetteis a 67 y.o. male who presents  With a 10 day history of cough and sinus congestion .  Cough is productive of yellow sputum .  He reports blood streaked nasal drainage and when he flushes with saline he is getting multiple clots.  Last abx use was over one year ago.   Past Medical History  Diagnosis Date  . Hypertension   . Diabetes mellitus   . Hypercholesterolemia   . Environmental allergies   . TIA (transient ischemic attack)   . Depression   . GERD (gastroesophageal reflux disease)   . Osteoarthritis     cervical spine, lumbar spine  . Paroxysmal atrial fibrillation   . C. difficile colitis   . Goiter     intrathoracic, s/p benign biopsy (Dr Gerrit Friends)  . Diverticulosis     Past Surgical History  Procedure Laterality Date  . Inguinal hernia repair  1991    Dr, Katrinka Blazing  . Septoplasty  1975  . Shoulder surgery  2000    rotator cuff  . Cervical disc surgery  2002  . Back surgery  8/08    s/p fusion of L4-L5  . Nose surgery      turbinate reduction  . Cataract extraction  2011    Dr. Elmer Picker  . Eyelid reduction      Dr. Chestine Spore    . methylPREDNISolone acetate  40 mg Intramuscular Once     The following portions of the patient's history were reviewed and updated as  appropriate: Allergies, current medications, and problem list.    Review of Systems:   12 Pt  review of systems was negative except those addressed in the HPI,     History   Social History  . Marital Status: Married    Spouse Name: N/A    Number of Children: 2  . Years of Education: N/A   Occupational History  . retired Runner, broadcasting/film/video    Social History Main Topics  . Smoking status: Never Smoker   . Smokeless tobacco: Never Used  . Alcohol Use: Yes     Comment: occasional  . Drug Use: No  . Sexual Activity: Not on file   Other Topics Concern  . Not on file   Social History Narrative  . No narrative on file    Objective:  Filed Vitals:   05/11/13 0933  BP: 150/70  Pulse: 75  Temp: 98.1 F (36.7 C)  Resp: 12     General appearance: alert, cooperative and appears stated age Ears: normal TM's and external ear canals both ears Face bilateral maxillary sinus tenderness Throat: lips, mucosa, and tongue normal; teeth and gums normal Neck: no adenopathy, no carotid bruit, supple, symmetrical, trachea midline and thyroid not enlarged, symmetric, no tenderness/mass/nodules Back: symmetric, no curvature. ROM normal. No  CVA tenderness. Lungs: clear to auscultation bilaterally Heart: regular rate and rhythm, S1, S2 normal, no murmur, click, rub or gallop Abdomen: soft, non-tender; bowel sounds normal; no masses,  no organomegaly Pulses: 2+ and symmetric Skin: Skin color, texture, turgor normal. No rashes or lesions Lymph nodes: Cervical, supraclavicular, and axillary nodes normal.  Assessment and Plan:  Sinusitis, acute maxillary Given chronicity of symptoms, development of facial pain and bloody nasal drainage will treat with empiric antibiotics, decongestants, and saline lavage.      Updated Medication List Outpatient Encounter Prescriptions as of 05/11/2013  Medication Sig  . ALPRAZolam (XANAX) 0.25 MG tablet Take 1 tablet (0.25 mg total) by mouth daily as needed.   Marland Kitchen amLODipine (NORVASC) 10 MG tablet Take 1 tablet (10 mg total) by mouth daily.  Marland Kitchen aspirin 81 MG tablet Take 81 mg by mouth daily.  Marland Kitchen atorvastatin (LIPITOR) 10 MG tablet Take 1 tablet (10 mg total) by mouth daily.  . Blood Glucose Monitoring Suppl (ACCU-CHEK AVIVA) device Use as instructed  . cetirizine (ZYRTEC) 10 MG tablet Take 10 mg by mouth daily.  . clopidogrel (PLAVIX) 75 MG tablet Take 1 tablet (75 mg total) by mouth daily.  . ferrous sulfate dried (SLOW FE) 160 (50 FE) MG TBCR Take 160 mg by mouth daily.  . fluticasone (FLONASE) 50 MCG/ACT nasal spray Place 2 sprays into the nose daily.  Marland Kitchen glipiZIDE (GLUCOTROL XL) 2.5 MG 24 hr tablet Take 3 tablets by mouth every morning  . hydrochlorothiazide (HYDRODIURIL) 25 MG tablet Take 1 tablet (25 mg total) by mouth daily.  Marland Kitchen HYDROcodone-acetaminophen (NORCO) 5-325 MG per tablet Take 1 tablet by mouth every 8 (eight) hours as needed for pain.  Marland Kitchen lisinopril (PRINIVIL,ZESTRIL) 40 MG tablet Take 1 tablet (40 mg total) by mouth daily.  . metFORMIN (GLUCOPHAGE) 1000 MG tablet 1 table q am and 1 1/2 tablet q pm  . metoprolol succinate (TOPROL-XL) 25 MG 24 hr tablet Take 1 tablet (25 mg total) by mouth daily.  . Multiple Vitamin (MULTIVITAMIN) tablet Take 1 tablet by mouth daily.  . niacin (NIASPAN) 500 MG CR tablet 3 tablets q day  . Omega-3 Fatty Acids (FISH OIL) 1200 MG CAPS Take by mouth 4 (four) times daily.  . pantoprazole (PROTONIX) 40 MG tablet Take 40 mg by mouth daily.  . pioglitazone (ACTOS) 30 MG tablet Take 1 tablet (30 mg total) by mouth daily.  . chlorpheniramine-HYDROcodone (TUSSIONEX) 10-8 MG/5ML LQCR Take 5 mLs by mouth at bedtime as needed for cough.  Marland Kitchen levofloxacin (LEVAQUIN) 500 MG tablet Take 1 tablet (500 mg total) by mouth daily.  . predniSONE (STERAPRED UNI-PAK) 10 MG tablet 6 tablets on Day 1 , then reduce by 1 tablet daily until gone  . [DISCONTINUED] benzonatate (TESSALON) 200 MG capsule Take 1 capsule (200 mg total) by  mouth 3 (three) times daily as needed for cough.     No orders of the defined types were placed in this encounter.    No Follow-up on file.

## 2013-05-11 NOTE — Patient Instructions (Addendum)
You have a sinus/ear infection   .  I am prescribing an antibiotic (levaquin ) and a prednisone taper  To manage the infection and the inflammation in your ear/sinuses.   I also advise use of the following OTC meds to help with your other symptoms.   Take generic OTC benadryl 25 mg every 8 hours for the drainage,  Sudafed PE  10 to 30 mg every 8 hours for the congestion, you may substitute Afrin nasal spray twice daily  For 5 days if the sudafed PE raises your blood pressure  Or causes  insomnia.  flush your sinuses twice daily with Simply Saline (do over the sink because if you do it right you will spit out globs of mucus)  Use tussionex cough syrup at bedtime or robitussin for daytime COUGH.  Gargle with salt water as needed for sore throat.

## 2013-05-12 ENCOUNTER — Encounter: Payer: Self-pay | Admitting: Internal Medicine

## 2013-05-12 NOTE — Assessment & Plan Note (Signed)
Given chronicity of symptoms, development of facial pain and bloody nasal drainage will treat with empiric antibiotics, decongestants, and saline lavage.

## 2013-05-16 ENCOUNTER — Encounter: Payer: Self-pay | Admitting: *Deleted

## 2013-05-16 ENCOUNTER — Other Ambulatory Visit: Payer: Self-pay | Admitting: *Deleted

## 2013-05-16 MED ORDER — METFORMIN HCL 1000 MG PO TABS: 1000.0000 mg | ORAL_TABLET | Freq: Two times a day (BID) | ORAL | Status: DC

## 2013-05-17 ENCOUNTER — Other Ambulatory Visit: Payer: Self-pay | Admitting: *Deleted

## 2013-05-17 MED ORDER — ALPRAZOLAM 0.25 MG PO TABS: 0.2500 mg | ORAL_TABLET | Freq: Every day | ORAL | Status: DC | PRN

## 2013-05-17 NOTE — Telephone Encounter (Signed)
Refill

## 2013-05-17 NOTE — Telephone Encounter (Signed)
Refilled xanax #30 with one refill.   

## 2013-06-15 ENCOUNTER — Other Ambulatory Visit: Payer: Self-pay | Admitting: *Deleted

## 2013-06-15 MED ORDER — AMLODIPINE BESYLATE 10 MG PO TABS: 10.0000 mg | ORAL_TABLET | Freq: Every day | ORAL | Status: DC

## 2013-06-28 ENCOUNTER — Encounter: Payer: 59 | Admitting: Internal Medicine

## 2013-07-08 ENCOUNTER — Other Ambulatory Visit: Payer: Self-pay | Admitting: *Deleted

## 2013-07-08 MED ORDER — METOPROLOL SUCCINATE ER 25 MG PO TB24: 25.0000 mg | ORAL_TABLET | Freq: Every day | ORAL | Status: DC

## 2013-07-14 ENCOUNTER — Ambulatory Visit (INDEPENDENT_AMBULATORY_CARE_PROVIDER_SITE_OTHER): Payer: 59 | Admitting: Internal Medicine

## 2013-07-14 ENCOUNTER — Telehealth: Payer: Self-pay | Admitting: *Deleted

## 2013-07-14 ENCOUNTER — Encounter: Payer: Self-pay | Admitting: Internal Medicine

## 2013-07-14 VITALS — BP 138/76 | HR 64 | Temp 98.6°F | Resp 16 | Ht 68.0 in | Wt 225.5 lb

## 2013-07-14 DIAGNOSIS — K219 Gastro-esophageal reflux disease without esophagitis: Secondary | ICD-10-CM

## 2013-07-14 DIAGNOSIS — E049 Nontoxic goiter, unspecified: Secondary | ICD-10-CM

## 2013-07-14 DIAGNOSIS — E7800 Pure hypercholesterolemia, unspecified: Secondary | ICD-10-CM

## 2013-07-14 DIAGNOSIS — E119 Type 2 diabetes mellitus without complications: Secondary | ICD-10-CM

## 2013-07-14 DIAGNOSIS — G459 Transient cerebral ischemic attack, unspecified: Secondary | ICD-10-CM

## 2013-07-14 DIAGNOSIS — I10 Essential (primary) hypertension: Secondary | ICD-10-CM

## 2013-07-14 DIAGNOSIS — M1990 Unspecified osteoarthritis, unspecified site: Secondary | ICD-10-CM

## 2013-07-14 DIAGNOSIS — G4733 Obstructive sleep apnea (adult) (pediatric): Secondary | ICD-10-CM

## 2013-07-14 MED ORDER — METOPROLOL SUCCINATE ER 25 MG PO TB24: 25.0000 mg | ORAL_TABLET | Freq: Every day | ORAL | Status: DC

## 2013-07-14 MED ORDER — CLOPIDOGREL BISULFATE 75 MG PO TABS: 75.0000 mg | ORAL_TABLET | Freq: Every day | ORAL | Status: DC

## 2013-07-14 MED ORDER — LISINOPRIL 40 MG PO TABS: 40.0000 mg | ORAL_TABLET | Freq: Every day | ORAL | Status: DC

## 2013-07-14 MED ORDER — AMLODIPINE BESYLATE 10 MG PO TABS: 10.0000 mg | ORAL_TABLET | Freq: Every day | ORAL | Status: DC

## 2013-07-14 MED ORDER — ALPRAZOLAM 0.25 MG PO TABS: 0.2500 mg | ORAL_TABLET | Freq: Every day | ORAL | Status: DC | PRN

## 2013-07-14 MED ORDER — ATORVASTATIN CALCIUM 10 MG PO TABS: 10.0000 mg | ORAL_TABLET | Freq: Every day | ORAL | Status: DC

## 2013-07-14 MED ORDER — HYDROCHLOROTHIAZIDE 25 MG PO TABS: 25.0000 mg | ORAL_TABLET | Freq: Every day | ORAL | Status: DC

## 2013-07-14 NOTE — Telephone Encounter (Signed)
Pt is coming in tomorrow what labs and dX? 

## 2013-07-14 NOTE — Progress Notes (Signed)
Pre-visit discussion using our clinic review tool. No additional management support is needed unless otherwise documented below in the visit note.  

## 2013-07-15 ENCOUNTER — Other Ambulatory Visit (INDEPENDENT_AMBULATORY_CARE_PROVIDER_SITE_OTHER): Payer: 59

## 2013-07-15 DIAGNOSIS — E119 Type 2 diabetes mellitus without complications: Secondary | ICD-10-CM

## 2013-07-15 DIAGNOSIS — I10 Essential (primary) hypertension: Secondary | ICD-10-CM

## 2013-07-15 DIAGNOSIS — E7800 Pure hypercholesterolemia, unspecified: Secondary | ICD-10-CM

## 2013-07-15 LAB — LIPID PANEL
Cholesterol: 163 mg/dL (ref 0–200)
HDL: 35.7 mg/dL — ABNORMAL LOW (ref >39.00)
LDL Cholesterol: 57 mg/dL (ref 0–99)
Total CHOL/HDL Ratio: 5
Triglycerides: 351 mg/dL — ABNORMAL HIGH (ref 0.0–149.0)
VLDL: 70.2 mg/dL — ABNORMAL HIGH (ref 0.0–40.0)

## 2013-07-15 LAB — BASIC METABOLIC PANEL
BUN: 19 mg/dL (ref 6–23)
CALCIUM: 10 mg/dL (ref 8.4–10.5)
CO2: 31 meq/L (ref 19–32)
CREATININE: 1 mg/dL (ref 0.4–1.5)
Chloride: 101 mEq/L (ref 96–112)
GFR: 82.51 mL/min (ref >60.00)
Glucose, Bld: 136 mg/dL — ABNORMAL HIGH (ref 70–99)
Potassium: 4.2 mEq/L (ref 3.5–5.1)
Sodium: 140 mEq/L (ref 135–145)

## 2013-07-15 LAB — HEPATIC FUNCTION PANEL
ALT: 31 U/L (ref 0–53)
AST: 21 U/L (ref 0–37)
Albumin: 3.9 g/dL (ref 3.5–5.2)
Alkaline Phosphatase: 33 U/L — ABNORMAL LOW (ref 39–117)
Bilirubin, Direct: 0 mg/dL (ref 0.0–0.3)
TOTAL PROTEIN: 7 g/dL (ref 6.0–8.3)
Total Bilirubin: 0.9 mg/dL (ref 0.3–1.2)

## 2013-07-15 LAB — HEMOGLOBIN A1C: Hgb A1c MFr Bld: 7.1 % — ABNORMAL HIGH (ref 4.6–6.5)

## 2013-07-15 NOTE — Telephone Encounter (Signed)
Orders placed for labs

## 2013-07-17 ENCOUNTER — Encounter: Payer: Self-pay | Admitting: Internal Medicine

## 2013-07-17 NOTE — Assessment & Plan Note (Signed)
Check sugars twice a day.  Up to date with eye checks.  Follow met b and a1c.  Sugars as outlined.  See attached list.  Same medications.

## 2013-07-17 NOTE — Assessment & Plan Note (Signed)
Continue CPAP.  

## 2013-07-17 NOTE — Assessment & Plan Note (Signed)
Sees Dr [facility]le.  Currently stable.  Follow.    

## 2013-07-17 NOTE — Assessment & Plan Note (Signed)
EGD revealed gastritis.  Currently on Protonix.  Symptoms controlled.   

## 2013-07-17 NOTE — Assessment & Plan Note (Signed)
Maintained on Plavix.  Currently asymptomatic.  Follow.   

## 2013-07-17 NOTE — Assessment & Plan Note (Signed)
Continue low cholesterol diet and exercise.  Continue current med regimen.  Follow lipid profile and liver function.  Off niaspan due to flushing.  Follow.   

## 2013-07-17 NOTE — Progress Notes (Signed)
Subjective:    Patient ID: Ricardo Nash, male    DOB: 1945/08/29, 68 y.o.   MRN: [id number]  HPI 68 year old male with past history of diabetes, hypertension, hypercholesterolemia and previously presumed TIA who comes in today to follow up on these issues as well as for a complete physical exam.  He was evaluated recently by Dr Derrel Nip and treated for sinus infection.  Took Levaquin and prednisone.  Feels better from that.  Pulled a muscle in his back.  This is now better.  He has had two recent episodes involving his stomach.  Some vomiting and diarrhea.  Lasted for a brief period and resolved.  (approximately 18 hours).  The last two weeks he has been constipated.  Some straining.  Is back on his align.  Taking a stool softener.   Breathing is doing well.  No chest congestion or wheezing.  Sugars attached.  Blood pressures doing well.  See attached list for details.  No nausea or vomiting now.   No acid reflux.  Sees Dr Ubaldo Glassing.   Stays active.  No cardiac symptoms with increased activity or exertion.     Past Medical History  Diagnosis Date  . Hypertension   . Diabetes mellitus   . Hypercholesterolemia   . Environmental allergies   . TIA (transient ischemic attack)   . Depression   . GERD (gastroesophageal reflux disease)   . Osteoarthritis     cervical spine, lumbar spine  . Paroxysmal atrial fibrillation   . C. difficile colitis   . Goiter     intrathoracic, s/p benign biopsy (Dr Harlow Asa)  . Diverticulosis     Current Outpatient Prescriptions on File Prior to Visit  Medication Sig Dispense Refill  . aspirin 81 MG tablet Take 81 mg by mouth daily.      . Blood Glucose Monitoring Suppl (ACCU-CHEK AVIVA) device Use as instructed  1 each  0  . cetirizine (ZYRTEC) 10 MG tablet Take 10 mg by mouth daily.      . ferrous sulfate dried (SLOW FE) 160 (50 FE) MG TBCR Take 160 mg by mouth daily.      . fluticasone (FLONASE) 50 MCG/ACT nasal spray Place 2 sprays into the nose daily.      Marland Kitchen  glipiZIDE (GLUCOTROL XL) 2.5 MG 24 hr tablet Take 3 tablets by mouth every morning  270 tablet  3  . HYDROcodone-acetaminophen (NORCO) 5-325 MG per tablet Take 1 tablet by mouth every 8 (eight) hours as needed for pain.  30 tablet  0  . metFORMIN (GLUCOPHAGE) 1000 MG tablet Take 1 tablet (1,000 mg total) by mouth 2 (two) times daily with a meal.  180 tablet  3  . Multiple Vitamin (MULTIVITAMIN) tablet Take 1 tablet by mouth daily.      . Omega-3 Fatty Acids (FISH OIL) 1200 MG CAPS Take by mouth 4 (four) times daily.      . pantoprazole (PROTONIX) 40 MG tablet Take 40 mg by mouth daily.      . pioglitazone (ACTOS) 30 MG tablet Take 1 tablet (30 mg total) by mouth daily.  90 tablet  3  . chlorpheniramine-HYDROcodone (TUSSIONEX) 10-8 MG/5ML LQCR Take 5 mLs by mouth at bedtime as needed for cough.  180 mL  0  . levofloxacin (LEVAQUIN) 500 MG tablet Take 1 tablet (500 mg total) by mouth daily.  10 tablet  0  . niacin (NIASPAN) 500 MG CR tablet 3 tablets q day  90 tablet  5  .  predniSONE (STERAPRED UNI-PAK) 10 MG tablet 6 tablets on Day 1 , then reduce by 1 tablet daily until gone  21 tablet  0   Current Facility-Administered Medications on File Prior to Visit  Medication Dose Route Frequency Provider Last Rate Last Dose  . methylPREDNISolone acetate (DEPO-MEDROL) injection 40 mg  40 mg Intramuscular Once Crecencio Mc, MD        Review of Systems Patient denies any headache, lightheadedness or dizziness.  No significant sinus symptoms.  Some left nasal congestion.  No chest pain, tightness or palpitations.  No increased shortness of breath, cough or congestion.  No acid reflux.  No nausea or vomiting now.  No abdominal pain or cramping.  Bowel changes as outlined.  No urine change.  Sugars as outlined.  Back better.  Some fatigue.          Objective:   Physical Exam  Filed Vitals:   07/14/13 1332  BP: 138/76  Pulse: 64  Temp: 98.6 F (37 C)  Resp: 76   68 year old male in no acute  distress.  HEENT:  Nares - clear.  Oropharynx - without lesions. NECK:  Supple.  Nontender.  No audible carotid bruit.  HEART:  Appears to be regular.   LUNGS:  No crackles or wheezing audible.  Respirations even and unlabored.   RADIAL PULSE:  Equal bilaterally.  ABDOMEN:  Soft.  Nontender.  Bowel sounds present and normal.  No audible abdominal bruit.  GU:  Performed by urology.     EXTREMITIES:  No increased edema present.  DP pulses palpable and equal bilaterally.  FEET:  Without lesions.              Assessment & Plan:  GI.  Bowel change as outlined.  Colonoscopy 07/21/11 revealed diverticulosis and internal hemorrhoids.  EGD with gastritis.  Upper symptoms controlled on Protonix.  Back on align and taking a stool softener.  Follow.  Notify me if persistent.    MSK.  Back better.  Follow.    ADRENAL GLAND ENLARGEMENT.  Had a lipomatous lesion in the central portion of the left adrenal gland.  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    CARDIOVASCULAR.  Stable.  Asymptomatic.  Continue follow up with Dr. Ubaldo Glassing.  Continue risk factor modification.    GU.  Sees Dr Jacqlyn Larsen.  He follows PSAs.    PULMONARY.  Sees Dr Raul Del.  Recent CT chest revealed no acute abnormality.  Recommended yearly follow up.   INCREASED PSYCHOSOCIAL STRESSORS.  Uses Xanax occasionally.  Follow.   HEALTH MAINTENANCE.  Physical today.  Colonoscopy 07/21/11 - diverticulosis and internal hemorrhoids.  PSAs followed through Dr Jacqlyn Larsen.

## 2013-07-17 NOTE — Assessment & Plan Note (Signed)
Blood pressure is under good control.  Same meds.  Follow metabolic panel.   

## 2013-07-17 NOTE — Assessment & Plan Note (Signed)
Previously saw Dr Gerkin.  Had an ultrasound.  Dr Gerkin felt - stable (actually smaller).  S/P biopsy.  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  Cytopathology showed follicular epithelial cells without atypia.  Follow.    

## 2013-07-20 ENCOUNTER — Encounter: Payer: Self-pay | Admitting: Internal Medicine

## 2013-07-21 ENCOUNTER — Other Ambulatory Visit: Payer: Self-pay | Admitting: Internal Medicine

## 2013-07-21 ENCOUNTER — Encounter: Payer: Self-pay | Admitting: Internal Medicine

## 2013-07-21 NOTE — Telephone Encounter (Signed)
Can he come in and have this checked, don't see it with his recent labs

## 2013-07-21 NOTE — Progress Notes (Signed)
Opened in error

## 2013-07-26 ENCOUNTER — Telehealth: Payer: Self-pay | Admitting: *Deleted

## 2013-07-26 DIAGNOSIS — Z0000 Encounter for general adult medical examination without abnormal findings: Secondary | ICD-10-CM

## 2013-07-26 NOTE — Telephone Encounter (Signed)
Pt is coming in for labs tomorrow what labs and dx?  

## 2013-07-27 ENCOUNTER — Other Ambulatory Visit (INDEPENDENT_AMBULATORY_CARE_PROVIDER_SITE_OTHER): Payer: 59

## 2013-07-27 DIAGNOSIS — Z0000 Encounter for general adult medical examination without abnormal findings: Secondary | ICD-10-CM

## 2013-07-27 NOTE — Telephone Encounter (Signed)
Order placed for varicella titer

## 2013-07-28 ENCOUNTER — Encounter: Payer: Self-pay | Admitting: Internal Medicine

## 2013-07-28 LAB — VARICELLA ZOSTER ANTIBODY, IGG: Varicella IgG: 150.4 Index — ABNORMAL HIGH (ref <135.00)

## 2013-08-25 ENCOUNTER — Encounter: Payer: Self-pay | Admitting: Internal Medicine

## 2013-08-25 NOTE — Telephone Encounter (Signed)
Please advise 

## 2013-08-26 ENCOUNTER — Telehealth: Payer: Self-pay | Admitting: Internal Medicine

## 2013-08-26 DIAGNOSIS — M79606 Pain in leg, unspecified: Secondary | ICD-10-CM

## 2013-08-26 DIAGNOSIS — M25559 Pain in unspecified hip: Secondary | ICD-10-CM

## 2013-08-26 NOTE — Telephone Encounter (Signed)
Order placed for ortho referral.   

## 2013-08-30 ENCOUNTER — Telehealth: Payer: Self-pay | Admitting: Internal Medicine

## 2013-08-30 NOTE — Telephone Encounter (Signed)
Referred to orthopedist at [facility] due to problems with back.  They have scheduled MRI for 4/27 a.m.  When he has had MRIs before he has had to have sedation.  States he usually gets valium.  States he has some xanax that Dr. Nicki Reaper has prescribed at 0.25 mg.  Asking if he should take valium or how many of the xanax he should take.  States he has claustrophobia.  States valium has always worked well but if Dr. Nicki Reaper thinks the xanax will work he just needs to know how much to take.

## 2013-08-31 NOTE — Telephone Encounter (Signed)
Since he has the xanax, he can take that with him and take one on arrival and then can repeat x 1 while there.  Tell him to have his wife drive him home from the test.  If any problems, let me know.

## 2013-08-31 NOTE — Telephone Encounter (Signed)
Left message, notifying patient on Xanax directions and to have wife drive him home from MRI.

## 2013-09-05 ENCOUNTER — Encounter: Payer: 59 | Admitting: Internal Medicine

## 2013-09-08 ENCOUNTER — Ambulatory Visit: Payer: Self-pay | Admitting: Orthopedic Surgery

## 2013-09-12 ENCOUNTER — Telehealth: Payer: Self-pay | Admitting: *Deleted

## 2013-09-12 NOTE — Telephone Encounter (Signed)
Nikki with [facility] called to notify you that Mr. Griess needs a epidural steroid injection & they are needing to hold his plavix for a few days. Please advise

## 2013-09-12 NOTE — Telephone Encounter (Signed)
Notify Nikki that I spoke to Mr Ricardo Nash and he is aware of risk of stopping his blood thinner.  He is in agreement to proceed.  Will need to start back as soon as possible after procedure.  Let me know if they need anything more.

## 2013-09-13 NOTE — Telephone Encounter (Signed)
Notified [facility] Lexine Baton was out today sick)-a message was taken

## 2013-09-14 ENCOUNTER — Telehealth: Payer: Self-pay | Admitting: *Deleted

## 2013-09-14 ENCOUNTER — Encounter: Payer: Self-pay | Admitting: Internal Medicine

## 2013-09-14 NOTE — Telephone Encounter (Signed)
Rx faxed

## 2013-09-14 NOTE — Telephone Encounter (Signed)
Letter printed and in your box.

## 2013-09-14 NOTE — Telephone Encounter (Signed)
Lexine Baton called back stating that Dr. Sharlet Salina needs this in writing & faxed to: [phone].

## 2013-09-14 NOTE — Telephone Encounter (Signed)
See other note

## 2013-09-28 DIAGNOSIS — I480 Paroxysmal atrial fibrillation: Secondary | ICD-10-CM | POA: Insufficient documentation

## 2013-09-28 DIAGNOSIS — E041 Nontoxic single thyroid nodule: Secondary | ICD-10-CM | POA: Insufficient documentation

## 2013-09-28 DIAGNOSIS — I10 Essential (primary) hypertension: Secondary | ICD-10-CM | POA: Insufficient documentation

## 2013-09-28 DIAGNOSIS — I2510 Atherosclerotic heart disease of native coronary artery without angina pectoris: Secondary | ICD-10-CM | POA: Insufficient documentation

## 2013-09-28 DIAGNOSIS — I4891 Unspecified atrial fibrillation: Secondary | ICD-10-CM | POA: Insufficient documentation

## 2013-10-11 DIAGNOSIS — M5416 Radiculopathy, lumbar region: Secondary | ICD-10-CM | POA: Insufficient documentation

## 2013-10-18 ENCOUNTER — Encounter: Payer: Self-pay | Admitting: Internal Medicine

## 2013-10-19 MED ORDER — TRAMADOL HCL 50 MG PO TABS: 50.0000 mg | ORAL_TABLET | Freq: Two times a day (BID) | ORAL | Status: DC | PRN

## 2013-10-19 NOTE — Telephone Encounter (Signed)
Pt was referred by me to ortho.  Is s/p injection.  Needs a refill of tramadol to help with pain.  Has seen Dr Jefm Bryant previously and he prescribed.  Tolerated.   Not seeing Dr Jefm Bryant now.  Asking for temporary refill.  Due to f/u with ortho in one month.  Refilled tramadol #30 with no refills.

## 2013-11-21 ENCOUNTER — Encounter: Payer: Self-pay | Admitting: Internal Medicine

## 2013-11-21 ENCOUNTER — Ambulatory Visit (INDEPENDENT_AMBULATORY_CARE_PROVIDER_SITE_OTHER): Payer: 59 | Admitting: Internal Medicine

## 2013-11-21 VITALS — BP 120/70 | HR 53 | Temp 98.1°F | Ht 68.0 in | Wt 227.5 lb

## 2013-11-21 DIAGNOSIS — E7800 Pure hypercholesterolemia, unspecified: Secondary | ICD-10-CM

## 2013-11-21 DIAGNOSIS — M5442 Lumbago with sciatica, left side: Secondary | ICD-10-CM

## 2013-11-21 DIAGNOSIS — G459 Transient cerebral ischemic attack, unspecified: Secondary | ICD-10-CM

## 2013-11-21 DIAGNOSIS — M5430 Sciatica, unspecified side: Secondary | ICD-10-CM

## 2013-11-21 DIAGNOSIS — M1990 Unspecified osteoarthritis, unspecified site: Secondary | ICD-10-CM

## 2013-11-21 DIAGNOSIS — M549 Dorsalgia, unspecified: Secondary | ICD-10-CM | POA: Insufficient documentation

## 2013-11-21 DIAGNOSIS — E119 Type 2 diabetes mellitus without complications: Secondary | ICD-10-CM

## 2013-11-21 DIAGNOSIS — I10 Essential (primary) hypertension: Secondary | ICD-10-CM

## 2013-11-21 DIAGNOSIS — K219 Gastro-esophageal reflux disease without esophagitis: Secondary | ICD-10-CM

## 2013-11-21 DIAGNOSIS — G4733 Obstructive sleep apnea (adult) (pediatric): Secondary | ICD-10-CM

## 2013-11-21 DIAGNOSIS — E049 Nontoxic goiter, unspecified: Secondary | ICD-10-CM

## 2013-11-21 LAB — LIPID PANEL
CHOLESTEROL: 158 mg/dL (ref 0–200)
HDL: 38.1 mg/dL — AB (ref >39.00)
LDL Cholesterol: 58 mg/dL (ref 0–99)
NONHDL: 119.9
Total CHOL/HDL Ratio: 4
Triglycerides: 309 mg/dL — ABNORMAL HIGH (ref 0.0–149.0)
VLDL: 61.8 mg/dL — ABNORMAL HIGH (ref 0.0–40.0)

## 2013-11-21 LAB — BASIC METABOLIC PANEL
BUN: 16 mg/dL (ref 6–23)
CO2: 31 meq/L (ref 19–32)
CREATININE: 0.9 mg/dL (ref 0.4–1.5)
Calcium: 9.6 mg/dL (ref 8.4–10.5)
Chloride: 104 mEq/L (ref 96–112)
GFR: 94.86 mL/min (ref >60.00)
Glucose, Bld: 130 mg/dL — ABNORMAL HIGH (ref 70–99)
POTASSIUM: 4 meq/L (ref 3.5–5.1)
Sodium: 143 mEq/L (ref 135–145)

## 2013-11-21 LAB — HEPATIC FUNCTION PANEL
ALBUMIN: 3.8 g/dL (ref 3.5–5.2)
ALT: 27 U/L (ref 0–53)
AST: 23 U/L (ref 0–37)
Alkaline Phosphatase: 36 U/L — ABNORMAL LOW (ref 39–117)
BILIRUBIN TOTAL: 1.1 mg/dL (ref 0.2–1.2)
Bilirubin, Direct: 0.2 mg/dL (ref 0.0–0.3)
Total Protein: 6.8 g/dL (ref 6.0–8.3)

## 2013-11-21 LAB — HEMOGLOBIN A1C: HEMOGLOBIN A1C: 6.9 % — AB (ref 4.6–6.5)

## 2013-11-21 MED ORDER — TRAMADOL HCL 50 MG PO TABS: 50.0000 mg | ORAL_TABLET | Freq: Two times a day (BID) | ORAL | Status: DC | PRN

## 2013-11-21 MED ORDER — ALPRAZOLAM 0.25 MG PO TABS: 0.2500 mg | ORAL_TABLET | Freq: Every day | ORAL | Status: DC | PRN

## 2013-11-21 NOTE — Assessment & Plan Note (Signed)
Continue CPAP.  

## 2013-11-21 NOTE — Assessment & Plan Note (Signed)
Maintained on Plavix.  Currently asymptomatic.  Follow.

## 2013-11-21 NOTE — Assessment & Plan Note (Signed)
Continue low cholesterol diet and exercise.  Continue current med regimen.  Follow lipid profile and liver function.  Off niaspan due to flushing.  Follow.

## 2013-11-21 NOTE — Assessment & Plan Note (Signed)
Check sugars twice a day.  Up to date with eye checks.  Due again in 11/15.  Follow met b and a1c.  Sugars attached.

## 2013-11-21 NOTE — Assessment & Plan Note (Signed)
Previously saw Dr Harlow Asa.  Had an ultrasound.  Dr Harlow Asa felt - stable (actually smaller).  S/P biopsy.  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  Cytopathology showed follicular epithelial cells without atypia.  Follow.

## 2013-11-21 NOTE — Progress Notes (Signed)
Subjective:    Patient ID: Ricardo Nash, male    DOB: May 26, 1945, 68 y.o.   MRN: [id number]  HPI 68 year old male with past history of diabetes, hypertension, hypercholesterolemia and previously presumed TIA who comes in today for a scheduled follow up.  Has been having increased pain with his back.  Is seeing Dr Sharlet Salina.  Had MRI that revealed buling disc, severe stenosis and arthritis.  Increased pain which limits him from exercising.  Pain in left lower back and extends down his left leg to knee.  Ultram works for him to help control the pain.  Only taking one in the am.  Request a refill on the tramadol.  Due to see Dr Sharlet Salina Friday for injections.  Affecting his right side - trying to compensate for the left.  Breathing is doing well.  No chest congestion or wheezing.  Sugars attached.  Blood pressures attached.  Overall under reasonable control.  See attached list for details.  No nausea or vomiting.   No acid reflux.  Sees Dr Ubaldo Glassing.  Just evaluated.  States Dr Ubaldo Glassing felt no further w/up warranted.      Past Medical History  Diagnosis Date  . Hypertension   . Diabetes mellitus   . Hypercholesterolemia   . Environmental allergies   . TIA (transient ischemic attack)   . Depression   . GERD (gastroesophageal reflux disease)   . Osteoarthritis     cervical spine, lumbar spine  . Paroxysmal atrial fibrillation   . C. difficile colitis   . Goiter     intrathoracic, s/p benign biopsy (Dr Harlow Asa)  . Diverticulosis     Current Outpatient Prescriptions on File Prior to Visit  Medication Sig Dispense Refill  . amLODipine (NORVASC) 10 MG tablet Take 1 tablet (10 mg total) by mouth daily.  90 tablet  3  . aspirin 81 MG tablet Take 81 mg by mouth daily.      Marland Kitchen atorvastatin (LIPITOR) 10 MG tablet Take 1 tablet (10 mg total) by mouth daily.  90 tablet  3  . Blood Glucose Monitoring Suppl (ACCU-CHEK AVIVA) device Use as instructed  1 each  0  . cetirizine (ZYRTEC) 10 MG tablet Take 10 mg by  mouth daily.      . clopidogrel (PLAVIX) 75 MG tablet Take 1 tablet (75 mg total) by mouth daily.  90 tablet  3  . fluticasone (FLONASE) 50 MCG/ACT nasal spray Place 2 sprays into the nose daily.      Marland Kitchen glipiZIDE (GLUCOTROL XL) 2.5 MG 24 hr tablet Take 3 tablets by mouth every morning  270 tablet  3  . hydrochlorothiazide (HYDRODIURIL) 25 MG tablet Take 1 tablet (25 mg total) by mouth daily.  90 tablet  3  . HYDROcodone-acetaminophen (NORCO) 5-325 MG per tablet Take 1 tablet by mouth every 8 (eight) hours as needed for pain.  30 tablet  0  . lisinopril (PRINIVIL,ZESTRIL) 40 MG tablet Take 1 tablet (40 mg total) by mouth daily.  90 tablet  3  . metFORMIN (GLUCOPHAGE) 1000 MG tablet Take 1 tablet (1,000 mg total) by mouth 2 (two) times daily with a meal.  180 tablet  3  . metoprolol succinate (TOPROL-XL) 25 MG 24 hr tablet Take 1 tablet (25 mg total) by mouth daily.  90 tablet  3  . Multiple Vitamin (MULTIVITAMIN) tablet Take 1 tablet by mouth daily.      . Omega-3 Fatty Acids (FISH OIL) 1200 MG CAPS Take by mouth  4 (four) times daily.      . pantoprazole (PROTONIX) 40 MG tablet Take 40 mg by mouth daily.      . pioglitazone (ACTOS) 30 MG tablet Take 1 tablet (30 mg total) by mouth daily.  90 tablet  3   Current Facility-Administered Medications on File Prior to Visit  Medication Dose Route Frequency Provider Last Rate Last Dose  . methylPREDNISolone acetate (DEPO-MEDROL) injection 40 mg  40 mg Intramuscular Once Crecencio Mc, MD        Review of Systems Patient denies any headache, lightheadedness or dizziness.  Some minimal nasal congestion and sinus congestion.  Using his nose spray and taking mucinex.  No significant pain or pressure now.  No chest pain, tightness or palpitations.  No cough or congestion.  No acid reflux.  No nausea or vomiting.  No abdominal pain or cramping.  No bowel change.  No urine change.  Sugars as outlined.  Blood pressures attached.  Increased back pain as outlined.           Objective:   Physical Exam  Filed Vitals:   11/21/13 0749  BP: 120/70  Pulse: 53  Temp: 98.1 F (36.7 C)   Blood pressure recheck:  39/68  68 year old male in no acute distress.  HEENT:  Nares - clear.  Oropharynx - without lesions. NECK:  Supple.  Nontender.  No audible carotid bruit.  HEART:  Appears to be regular.   LUNGS:  No crackles or wheezing audible.  Respirations even and unlabored.   RADIAL PULSE:  Equal bilaterally.  ABDOMEN:  Soft.  Nontender.  Bowel sounds present and normal.  No audible abdominal bruit.     EXTREMITIES:  No increased edema present.  DP pulses palpable and equal bilaterally.  FEET:  Without lesions.   MSK:  Increased back and left leg pain with going from sitting to standing position.              Assessment & Plan:  GI.  Colonoscopy 07/21/11 revealed diverticulosis and internal hemorrhoids.  EGD with gastritis.  Upper symptoms controlled on Protonix.  Bowels stable.     ADRENAL GLAND ENLARGEMENT.  Had a lipomatous lesion in the central portion of the left adrenal gland.  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    CARDIOVASCULAR.  Continue follow up with Dr. Ubaldo Glassing.  Continue risk factor modification.  Just evaluated.  States Dr Ubaldo Glassing felt no further w/up warranted.    GU.  Sees Dr Jacqlyn Larsen.  He follows PSAs.    PULMONARY.  Sees Dr Raul Del.  Recent CT chest revealed no acute abnormality.  Recommended yearly follow up.   INCREASED PSYCHOSOCIAL STRESSORS.  Uses Xanax occasionally.  Follow.  Refilled today.    HEALTH MAINTENANCE.  Physical 07/14/13..  Colonoscopy 07/21/11 - diverticulosis and internal hemorrhoids.  PSAs followed through Dr Jacqlyn Larsen.

## 2013-11-21 NOTE — Assessment & Plan Note (Signed)
Increased back pain as outlined.  MRI with bulging disc, stenosis and arthritis.  Limiting his ability to exercise and get around as he desires.  Seeing Dr Sharlet Salina.  Refilled Tramadol.  Planning for injections end of this week.  Follow.

## 2013-11-21 NOTE — Progress Notes (Signed)
Pre visit review using our clinic review tool, if applicable. No additional management support is needed unless otherwise documented below in the visit note. 

## 2013-11-21 NOTE — Assessment & Plan Note (Signed)
Sees Dr Jefm Bryant.  Currently stable.  Follow.

## 2013-11-21 NOTE — Assessment & Plan Note (Signed)
Blood pressure has been under good control.  Same meds.  Follow metabolic panel.  Up a little today secondary to pain.

## 2013-11-21 NOTE — Assessment & Plan Note (Signed)
EGD revealed gastritis.  Currently on Protonix.  Symptoms controlled.

## 2013-11-23 ENCOUNTER — Encounter: Payer: Self-pay | Admitting: Internal Medicine

## 2013-12-04 ENCOUNTER — Encounter: Payer: Self-pay | Admitting: Internal Medicine

## 2013-12-05 MED ORDER — FLUTICASONE PROPIONATE 50 MCG/ACT NA SUSP: 2.0000 | Freq: Every day | NASAL | Status: DC

## 2013-12-07 ENCOUNTER — Encounter: Payer: Self-pay | Admitting: Internal Medicine

## 2013-12-15 ENCOUNTER — Encounter: Payer: Self-pay | Admitting: Internal Medicine

## 2013-12-15 DIAGNOSIS — M5442 Lumbago with sciatica, left side: Secondary | ICD-10-CM

## 2013-12-23 ENCOUNTER — Other Ambulatory Visit: Payer: Self-pay | Admitting: *Deleted

## 2013-12-23 MED ORDER — TRAMADOL HCL 50 MG PO TABS: 50.0000 mg | ORAL_TABLET | Freq: Two times a day (BID) | ORAL | Status: DC | PRN

## 2013-12-26 ENCOUNTER — Telehealth: Payer: Self-pay | Admitting: Internal Medicine

## 2013-12-26 DIAGNOSIS — M545 Low back pain: Secondary | ICD-10-CM

## 2013-12-26 NOTE — Telephone Encounter (Signed)
Pt left letter regarding increased back pain.  Wanted referral.  My chart message sent to pt for specifics on referral.   Waiting for response.

## 2013-12-27 NOTE — Addendum Note (Signed)
Addended by: Alisa Graff on: 12/27/2013 01:11 PM   Modules accepted: Orders

## 2013-12-27 NOTE — Telephone Encounter (Signed)
Order placed for neurosurgery referral.  My chart message sent to pt.

## 2014-01-18 DIAGNOSIS — R339 Retention of urine, unspecified: Secondary | ICD-10-CM | POA: Insufficient documentation

## 2014-01-18 DIAGNOSIS — R350 Frequency of micturition: Secondary | ICD-10-CM | POA: Insufficient documentation

## 2014-02-24 ENCOUNTER — Other Ambulatory Visit: Payer: Self-pay | Admitting: *Deleted

## 2014-02-24 MED ORDER — PIOGLITAZONE HCL 30 MG PO TABS: 30.0000 mg | ORAL_TABLET | Freq: Every day | ORAL | Status: DC

## 2014-02-24 MED ORDER — TRAMADOL HCL 50 MG PO TABS: 50.0000 mg | ORAL_TABLET | Freq: Two times a day (BID) | ORAL | Status: DC | PRN

## 2014-03-01 ENCOUNTER — Telehealth: Payer: Self-pay | Admitting: Internal Medicine

## 2014-03-01 NOTE — Telephone Encounter (Signed)
I spoke with patient & he stated that it was never received. Please refax Rx to [facility] if you have it in your folder

## 2014-03-01 NOTE — Telephone Encounter (Signed)
Rx was refilled & faxed to pharmacy on 10/16

## 2014-03-01 NOTE — Telephone Encounter (Signed)
Rx called in, not found in folder

## 2014-03-01 NOTE — Telephone Encounter (Signed)
Patient would like to have his ULTRAM prescription refilled.

## 2014-03-03 ENCOUNTER — Other Ambulatory Visit: Payer: Self-pay | Admitting: *Deleted

## 2014-03-03 MED ORDER — GLIPIZIDE ER 2.5 MG PO TB24: ORAL_TABLET | ORAL | Status: DC

## 2014-03-07 ENCOUNTER — Other Ambulatory Visit: Payer: Self-pay | Admitting: *Deleted

## 2014-03-07 MED ORDER — GLIPIZIDE ER 2.5 MG PO TB24: ORAL_TABLET | ORAL | Status: DC

## 2014-03-07 MED ORDER — PIOGLITAZONE HCL 30 MG PO TABS: 30.0000 mg | ORAL_TABLET | Freq: Every day | ORAL | Status: DC

## 2014-03-21 ENCOUNTER — Telehealth: Payer: Self-pay

## 2014-03-21 NOTE — Telephone Encounter (Signed)
Left detailed voicemail on home number

## 2014-03-21 NOTE — Telephone Encounter (Signed)
The patient was just released from [facility] after having back surgery.  He is hoping to be worked in for a follow up and to have his stiches removed in 7-10 days.  Where do you want him worked in?  Callback - [phone]

## 2014-03-21 NOTE — Telephone Encounter (Signed)
With him having back surgery, I recommend f/u with his surgeon for suture removal and to make sure progressing like it should.  I do not mind seeing him, but feel he needs this done at surgeon's office.

## 2014-03-22 ENCOUNTER — Telehealth: Payer: Self-pay | Admitting: *Deleted

## 2014-03-22 NOTE — Telephone Encounter (Signed)
Dr. Nicki Reaper,    During Ricardo Nash's stay in the hospital his magnesium was low. In his discharge instructions they told him to have blood work in about a week with his pri.care physicians office to evaluate the need to continue so could you make an appt.for this lab work in your office.They said not to take until getting ck'd.Ricardo Nash came home Tues.(yesterday)afternoon. Thank you,Marty Fera   (This message was originally received through spouses Pharmacist, community)

## 2014-03-22 NOTE — Telephone Encounter (Signed)
I last checked his sugar and cholesterol in July. If he wants, he can come in fasting over the next week and I will check his magnesium and other labs at the same time.  If he is ok, schedule fasting labs and let me know and I will put in the order

## 2014-03-23 ENCOUNTER — Encounter: Payer: Self-pay | Admitting: *Deleted

## 2014-03-23 ENCOUNTER — Telehealth: Payer: Self-pay | Admitting: Internal Medicine

## 2014-03-23 MED ORDER — ALPRAZOLAM 0.25 MG PO TABS: 0.2500 mg | ORAL_TABLET | Freq: Every day | ORAL | Status: DC | PRN

## 2014-03-23 NOTE — Telephone Encounter (Signed)
Rx faxed to [facility]

## 2014-03-23 NOTE — Telephone Encounter (Signed)
Refilled xanax #[phone] with no refills.  In basket.

## 2014-03-23 NOTE — Telephone Encounter (Signed)
Mychart message sent.

## 2014-03-24 ENCOUNTER — Ambulatory Visit: Payer: 59 | Admitting: Internal Medicine

## 2014-03-24 DIAGNOSIS — R8281 Pyuria: Secondary | ICD-10-CM | POA: Insufficient documentation

## 2014-03-24 LAB — HM DIABETES EYE EXAM

## 2014-03-27 ENCOUNTER — Telehealth: Payer: Self-pay | Admitting: *Deleted

## 2014-03-27 ENCOUNTER — Other Ambulatory Visit (INDEPENDENT_AMBULATORY_CARE_PROVIDER_SITE_OTHER): Payer: Medicare Other

## 2014-03-27 DIAGNOSIS — G459 Transient cerebral ischemic attack, unspecified: Secondary | ICD-10-CM

## 2014-03-27 DIAGNOSIS — G4733 Obstructive sleep apnea (adult) (pediatric): Secondary | ICD-10-CM

## 2014-03-27 DIAGNOSIS — N3000 Acute cystitis without hematuria: Secondary | ICD-10-CM | POA: Insufficient documentation

## 2014-03-27 DIAGNOSIS — E7800 Pure hypercholesterolemia, unspecified: Secondary | ICD-10-CM

## 2014-03-27 DIAGNOSIS — I10 Essential (primary) hypertension: Secondary | ICD-10-CM

## 2014-03-27 DIAGNOSIS — E119 Type 2 diabetes mellitus without complications: Secondary | ICD-10-CM

## 2014-03-27 DIAGNOSIS — E049 Nontoxic goiter, unspecified: Secondary | ICD-10-CM

## 2014-03-27 DIAGNOSIS — E010 Iodine-deficiency related diffuse (endemic) goiter: Secondary | ICD-10-CM

## 2014-03-27 LAB — CBC WITH DIFFERENTIAL/PLATELET
Basophils Absolute: 0 10*3/uL (ref 0.0–0.1)
Basophils Relative: 0.4 % (ref 0.0–3.0)
EOS ABS: 0.1 10*3/uL (ref 0.0–0.7)
EOS PCT: 1.1 % (ref 0.0–5.0)
HEMATOCRIT: 36.6 % — AB (ref 39.0–52.0)
Hemoglobin: 11.7 g/dL — ABNORMAL LOW (ref 13.0–17.0)
LYMPHS ABS: 2 10*3/uL (ref 0.7–4.0)
Lymphocytes Relative: 20.3 % (ref 12.0–46.0)
MCHC: 31.9 g/dL (ref 30.0–36.0)
MCV: 89.1 fl (ref 78.0–100.0)
MONO ABS: 0.4 10*3/uL (ref 0.1–1.0)
Monocytes Relative: 4.4 % (ref 3.0–12.0)
NEUTROS PCT: 73.8 % (ref 43.0–77.0)
Neutro Abs: 7.2 10*3/uL (ref 1.4–7.7)
PLATELETS: 466 10*3/uL — AB (ref 150.0–400.0)
RBC: 4.11 Mil/uL — ABNORMAL LOW (ref 4.22–5.81)
RDW: 14.8 % (ref 11.5–15.5)
WBC: 9.7 10*3/uL (ref 4.0–10.5)

## 2014-03-27 NOTE — Telephone Encounter (Signed)
Orders placed for labs

## 2014-03-27 NOTE — Telephone Encounter (Signed)
What labs and dx?  

## 2014-03-28 LAB — BASIC METABOLIC PANEL
BUN: 23 mg/dL (ref 6–23)
CHLORIDE: 102 meq/L (ref 96–112)
CO2: 25 mEq/L (ref 19–32)
CREATININE: 1 mg/dL (ref 0.4–1.5)
Calcium: 9.9 mg/dL (ref 8.4–10.5)
GFR: 77.66 mL/min (ref >60.00)
Glucose, Bld: 143 mg/dL — ABNORMAL HIGH (ref 70–99)
Potassium: 4.9 mEq/L (ref 3.5–5.1)
Sodium: 141 mEq/L (ref 135–145)

## 2014-03-28 LAB — LIPID PANEL
CHOLESTEROL: 158 mg/dL (ref 0–200)
HDL: 32 mg/dL — ABNORMAL LOW (ref >39.00)
NonHDL: 126
Total CHOL/HDL Ratio: 5
Triglycerides: 223 mg/dL — ABNORMAL HIGH (ref 0.0–149.0)
VLDL: 44.6 mg/dL — ABNORMAL HIGH (ref 0.0–40.0)

## 2014-03-28 LAB — HEPATIC FUNCTION PANEL
ALK PHOS: 50 U/L (ref 39–117)
ALT: 20 U/L (ref 0–53)
AST: 20 U/L (ref 0–37)
Albumin: 3.6 g/dL (ref 3.5–5.2)
BILIRUBIN DIRECT: 0.2 mg/dL (ref 0.0–0.3)
BILIRUBIN TOTAL: 0.4 mg/dL (ref 0.2–1.2)
TOTAL PROTEIN: 7.2 g/dL (ref 6.0–8.3)

## 2014-03-28 LAB — HEMOGLOBIN A1C: Hgb A1c MFr Bld: 7.2 % — ABNORMAL HIGH (ref 4.6–6.5)

## 2014-03-28 LAB — LDL CHOLESTEROL, DIRECT: Direct LDL: 100.6 mg/dL

## 2014-03-28 LAB — TSH: TSH: 1.08 u[IU]/mL (ref 0.35–4.50)

## 2014-03-29 ENCOUNTER — Telehealth: Payer: Self-pay | Admitting: Internal Medicine

## 2014-03-29 ENCOUNTER — Encounter: Payer: Self-pay | Admitting: Internal Medicine

## 2014-03-29 DIAGNOSIS — D649 Anemia, unspecified: Secondary | ICD-10-CM

## 2014-03-29 NOTE — Telephone Encounter (Signed)
Order placed for magnesium.

## 2014-03-29 NOTE — Telephone Encounter (Signed)
Pt notified of lab results via my chart.  Needs a non fasting lab in 1 weeks.  Please schedule and contact him with a lab appt date and time.  Thanks.    Dr Nicki Reaper

## 2014-03-31 ENCOUNTER — Other Ambulatory Visit (INDEPENDENT_AMBULATORY_CARE_PROVIDER_SITE_OTHER): Payer: Medicare Other

## 2014-03-31 LAB — MAGNESIUM: MAGNESIUM: 1.8 mg/dL (ref 1.5–2.5)

## 2014-04-01 ENCOUNTER — Encounter: Payer: Self-pay | Admitting: Internal Medicine

## 2014-04-02 MED ORDER — MAGNESIUM OXIDE 400 MG PO TABS: 400.0000 mg | ORAL_TABLET | Freq: Every day | ORAL | Status: DC

## 2014-04-02 NOTE — Telephone Encounter (Signed)
Sent in rx for magoxide 400mg  q day.  My chart message sent.

## 2014-04-05 ENCOUNTER — Other Ambulatory Visit (INDEPENDENT_AMBULATORY_CARE_PROVIDER_SITE_OTHER): Payer: Medicare Other

## 2014-04-05 DIAGNOSIS — E119 Type 2 diabetes mellitus without complications: Secondary | ICD-10-CM

## 2014-04-05 DIAGNOSIS — D649 Anemia, unspecified: Secondary | ICD-10-CM | POA: Diagnosis not present

## 2014-04-05 LAB — CBC WITH DIFFERENTIAL/PLATELET
BASOS ABS: 0.1 10*3/uL (ref 0.0–0.1)
Basophils Relative: 0.8 % (ref 0.0–3.0)
Eosinophils Absolute: 0.2 10*3/uL (ref 0.0–0.7)
Eosinophils Relative: 2.4 % (ref 0.0–5.0)
HCT: 36.4 % — ABNORMAL LOW (ref 39.0–52.0)
Hemoglobin: 11.9 g/dL — ABNORMAL LOW (ref 13.0–17.0)
LYMPHS PCT: 20.1 % (ref 12.0–46.0)
Lymphs Abs: 1.5 10*3/uL (ref 0.7–4.0)
MCHC: 32.8 g/dL (ref 30.0–36.0)
MCV: 85.8 fl (ref 78.0–100.0)
MONOS PCT: 5.4 % (ref 3.0–12.0)
Monocytes Absolute: 0.4 10*3/uL (ref 0.1–1.0)
Neutro Abs: 5.2 10*3/uL (ref 1.4–7.7)
Neutrophils Relative %: 71.3 % (ref 43.0–77.0)
PLATELETS: 503 10*3/uL — AB (ref 150.0–400.0)
RBC: 4.24 Mil/uL (ref 4.22–5.81)
RDW: 14.2 % (ref 11.5–15.5)
WBC: 7.2 10*3/uL (ref 4.0–10.5)

## 2014-04-05 LAB — FERRITIN: FERRITIN: 56.9 ng/mL (ref 22.0–322.0)

## 2014-04-05 LAB — IBC PANEL
IRON: 56 ug/dL (ref 42–165)
Saturation Ratios: 10.8 % — ABNORMAL LOW (ref 20.0–50.0)
TRANSFERRIN: 370 mg/dL — AB (ref 212.0–360.0)

## 2014-04-05 LAB — VITAMIN B12: Vitamin B-12: 236 pg/mL (ref 211–911)

## 2014-04-05 LAB — MAGNESIUM: Magnesium: 1.6 mg/dL (ref 1.5–2.5)

## 2014-04-06 ENCOUNTER — Encounter: Payer: Self-pay | Admitting: Internal Medicine

## 2014-04-14 ENCOUNTER — Ambulatory Visit (INDEPENDENT_AMBULATORY_CARE_PROVIDER_SITE_OTHER): Payer: Medicare Other | Admitting: Internal Medicine

## 2014-04-14 ENCOUNTER — Encounter: Payer: Self-pay | Admitting: Internal Medicine

## 2014-04-14 VITALS — BP 110/60 | HR 60 | Temp 98.1°F | Ht 68.0 in | Wt 215.2 lb

## 2014-04-14 DIAGNOSIS — K219 Gastro-esophageal reflux disease without esophagitis: Secondary | ICD-10-CM

## 2014-04-14 DIAGNOSIS — E7800 Pure hypercholesterolemia, unspecified: Secondary | ICD-10-CM

## 2014-04-14 DIAGNOSIS — G459 Transient cerebral ischemic attack, unspecified: Secondary | ICD-10-CM

## 2014-04-14 DIAGNOSIS — E669 Obesity, unspecified: Secondary | ICD-10-CM

## 2014-04-14 DIAGNOSIS — E119 Type 2 diabetes mellitus without complications: Secondary | ICD-10-CM

## 2014-04-14 DIAGNOSIS — G479 Sleep disorder, unspecified: Secondary | ICD-10-CM

## 2014-04-14 DIAGNOSIS — M5442 Lumbago with sciatica, left side: Secondary | ICD-10-CM

## 2014-04-14 DIAGNOSIS — G4733 Obstructive sleep apnea (adult) (pediatric): Secondary | ICD-10-CM

## 2014-04-14 DIAGNOSIS — I10 Essential (primary) hypertension: Secondary | ICD-10-CM

## 2014-04-14 MED ORDER — TRAZODONE HCL 50 MG PO TABS: 25.0000 mg | ORAL_TABLET | Freq: Every evening | ORAL | Status: DC | PRN

## 2014-04-14 MED ORDER — ALPRAZOLAM 0.25 MG PO TABS: 0.2500 mg | ORAL_TABLET | Freq: Every day | ORAL | Status: DC | PRN

## 2014-04-14 NOTE — Progress Notes (Signed)
Subjective:    Patient ID: Ricardo Nash, male    DOB: [birth year], 68 y.o.   MRN: [id number]  HPI 68 year old male with past history of diabetes, hypertension, hypercholesterolemia and previously presumed TIA who comes in today for a scheduled follow up.  Had been having increased pain with his back.  Was seeing Dr Sharlet Salina.  Had MRI that revealed buling disc, severe stenosis and arthritis.  Is now s/p surgery.   Back and leg pain better.   Breathing is doing well.  No chest congestion or wheezing.  Sugars attached.  Blood pressures attached.  Overall under reasonable control.  See attached list for details.  No nausea or vomiting.   No acid reflux.  Sees Dr Ubaldo Glassing.  His main complaint is that of difficulty sleeping.  States he is not sleeping.  Persistent problem for him.  Saw Dr Jacqlyn Larsen.  Infection treated.  On flomax.  Urinary symptoms better.  Off pain medications.  Using his CPAP regularly.      Past Medical History  Diagnosis Date  . Hypertension   . Diabetes mellitus   . Hypercholesterolemia   . Environmental allergies   . TIA (transient ischemic attack)   . Depression   . GERD (gastroesophageal reflux disease)   . Osteoarthritis     cervical spine, lumbar spine  . Paroxysmal atrial fibrillation   . C. difficile colitis   . Goiter     intrathoracic, s/p benign biopsy (Dr Harlow Asa)  . Diverticulosis     Current Outpatient Prescriptions on File Prior to Visit  Medication Sig Dispense Refill  . ALPRAZolam (XANAX) 0.25 MG tablet Take 1 tablet (0.25 mg total) by mouth daily as needed. 30 tablet 0  . amLODipine (NORVASC) 10 MG tablet Take 1 tablet (10 mg total) by mouth daily. 90 tablet 3  . aspirin 81 MG tablet Take 81 mg by mouth daily.    Marland Kitchen atorvastatin (LIPITOR) 10 MG tablet Take 1 tablet (10 mg total) by mouth daily. 90 tablet 3  . cetirizine (ZYRTEC) 10 MG tablet Take 10 mg by mouth daily.    . clopidogrel (PLAVIX) 75 MG tablet Take 1 tablet (75 mg total) by mouth daily. 90  tablet 3  . fluticasone (FLONASE) 50 MCG/ACT nasal spray Place 2 sprays into both nostrils daily. 16 g 5  . gabapentin (NEURONTIN) 300 MG capsule Take 300 mg by mouth 3 (three) times daily.    Marland Kitchen glipiZIDE (GLUCOTROL XL) 2.5 MG 24 hr tablet Take 3 tablets by mouth every morning 90 tablet 1  . hydrochlorothiazide (HYDRODIURIL) 25 MG tablet Take 1 tablet (25 mg total) by mouth daily. 90 tablet 3  . HYDROcodone-acetaminophen (NORCO) 5-325 MG per tablet Take 1 tablet by mouth every 8 (eight) hours as needed for pain. 30 tablet 0  . lisinopril (PRINIVIL,ZESTRIL) 40 MG tablet Take 1 tablet (40 mg total) by mouth daily. 90 tablet 3  . magnesium oxide (MAG-OX) 400 MG tablet Take 1 tablet (400 mg total) by mouth daily. 30 tablet 0  . metFORMIN (GLUCOPHAGE) 1000 MG tablet Take 1 tablet (1,000 mg total) by mouth 2 (two) times daily with a meal. 180 tablet 3  . metoprolol succinate (TOPROL-XL) 25 MG 24 hr tablet Take 1 tablet (25 mg total) by mouth daily. 90 tablet 3  . Multiple Vitamin (MULTIVITAMIN) tablet Take 1 tablet by mouth daily.    . Omega-3 Fatty Acids (FISH OIL) 1200 MG CAPS Take by mouth 4 (four) times daily.    Marland Kitchen  pantoprazole (PROTONIX) 40 MG tablet Take 40 mg by mouth daily.    . pioglitazone (ACTOS) 30 MG tablet Take 1 tablet (30 mg total) by mouth daily. 90 tablet 1  . traMADol (ULTRAM) 50 MG tablet Take 1 tablet (50 mg total) by mouth 2 (two) times daily as needed. 60 tablet 1   Current Facility-Administered Medications on File Prior to Visit  Medication Dose Route Frequency Provider Last Rate Last Dose  . methylPREDNISolone acetate (DEPO-MEDROL) injection 40 mg  40 mg Intramuscular Once Crecencio Mc, MD        Review of Systems Patient denies any headache, lightheadedness or dizziness.  No sinus or allergy symptoms.   No chest pain, tightness or palpitations.  No cough or congestion.  No acid reflux.  No nausea or vomiting.  No abdominal pain or cramping.  No bowel change.  No urine  change.  Sugars as outlined.  Blood pressures attached.  Back and leg pain better.  Difficulty sleeping.           Objective:   Physical Exam  Filed Vitals:   04/14/14 0855  BP: 110/60  Pulse: 60  Temp: 98.1 F (36.7 C)   Blood pressure recheck:  48/27  68 year old male in no acute distress.  HEENT:  Nares - clear.  Oropharynx - without lesions. NECK:  Supple.  Nontender.  No audible carotid bruit.  HEART:  Appears to be regular.   LUNGS:  No crackles or wheezing audible.  Respirations even and unlabored.   RADIAL PULSE:  Equal bilaterally.  ABDOMEN:  Soft.  Nontender.  Bowel sounds present and normal.  No audible abdominal bruit.     EXTREMITIES:  No increased edema present.  DP pulses palpable and equal bilaterally.            Assessment & Plan:  Obesity (BMI 30-39.9) Diet and exercise.    Essential hypertension Blood pressure doing well.  Same medication regimen.   Transient cerebral ischemia, unspecified transient cerebral ischemia type Stable.  On aspirin and plavix.    Obstructive sleep apnea Using CPAP regularly.    Gastroesophageal reflux disease, esophagitis presence not specified Controlled.    Type 2 diabetes mellitus without complication Sugars attached.  Follow met b and a1c.    Hypercholesterolemia Low cholesterol diet and exercise.  On lipitor.  Follow lipid panel and liver function tests.  Lab Results  Component Value Date   CHOL 158 03/27/2014   HDL 32.00* 03/27/2014   LDLCALC 58 11/21/2013   LDLDIRECT 100.6 03/27/2014   TRIG 223.0* 03/27/2014   CHOLHDL 5 03/27/2014   Left-sided low back pain with left-sided sciatica Better s/p surgery.  Doing well.  Continue to f/u with his surgeon.    Difficulty sleeping Not able to sleep.  Start trazodoe as outlined.  Folllow.    GI.  Colonoscopy 07/21/11 revealed diverticulosis and internal hemorrhoids.  EGD with gastritis.  Upper symptoms controlled on Protonix.  Bowels stable.     ADRENAL GLAND  ENLARGEMENT.  Had a lipomatous lesion in the central portion of the left adrenal gland.  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    CARDIOVASCULAR.  Continue follow up with Dr. Ubaldo Glassing.  Continue risk factor modification.  Just evaluated.  States Dr Ubaldo Glassing felt no further w/up warranted.    GU.  Sees Dr Jacqlyn Larsen.  He follows PSAs.  Recent infection.  Treated.   On flomax.  PULMONARY.  Sees Dr Raul Del.  Recent CT chest revealed no acute abnormality.  Recommended yearly follow up.   INCREASED PSYCHOSOCIAL STRESSORS.  Uses Xanax occasionally.  Follow.  Refilled today.  Treat sleep issue as outlined.     HEALTH MAINTENANCE.  Physical 07/14/13..  Colonoscopy 07/21/11 - diverticulosis and internal hemorrhoids.  PSAs followed through Dr Jacqlyn Larsen.

## 2014-04-14 NOTE — Progress Notes (Signed)
Pre visit review using our clinic review tool, if applicable. No additional management support is needed unless otherwise documented below in the visit note. 

## 2014-04-17 ENCOUNTER — Encounter: Payer: Self-pay | Admitting: Internal Medicine

## 2014-04-17 DIAGNOSIS — G479 Sleep disorder, unspecified: Secondary | ICD-10-CM | POA: Insufficient documentation

## 2014-04-17 DIAGNOSIS — Z6833 Body mass index (BMI) 33.0-33.9, adult: Secondary | ICD-10-CM | POA: Insufficient documentation

## 2014-04-18 ENCOUNTER — Other Ambulatory Visit: Payer: Self-pay | Admitting: *Deleted

## 2014-04-18 MED ORDER — HYDROCHLOROTHIAZIDE 25 MG PO TABS: 25.0000 mg | ORAL_TABLET | Freq: Every day | ORAL | Status: DC

## 2014-04-18 MED ORDER — AMLODIPINE BESYLATE 10 MG PO TABS: 10.0000 mg | ORAL_TABLET | Freq: Every day | ORAL | Status: DC

## 2014-04-25 ENCOUNTER — Other Ambulatory Visit: Payer: Self-pay | Admitting: *Deleted

## 2014-04-25 MED ORDER — ATORVASTATIN CALCIUM 10 MG PO TABS: 10.0000 mg | ORAL_TABLET | Freq: Every day | ORAL | Status: DC

## 2014-05-01 ENCOUNTER — Other Ambulatory Visit: Payer: Self-pay | Admitting: *Deleted

## 2014-05-01 MED ORDER — GLIPIZIDE ER 2.5 MG PO TB24: ORAL_TABLET | ORAL | Status: DC

## 2014-05-01 MED ORDER — CLOPIDOGREL BISULFATE 75 MG PO TABS: 75.0000 mg | ORAL_TABLET | Freq: Every day | ORAL | Status: DC

## 2014-05-08 ENCOUNTER — Other Ambulatory Visit: Payer: Self-pay | Admitting: *Deleted

## 2014-05-08 MED ORDER — METOPROLOL SUCCINATE ER 25 MG PO TB24: 25.0000 mg | ORAL_TABLET | Freq: Every day | ORAL | Status: DC

## 2014-05-08 MED ORDER — LISINOPRIL 40 MG PO TABS: 40.0000 mg | ORAL_TABLET | Freq: Every day | ORAL | Status: DC

## 2014-05-20 ENCOUNTER — Encounter: Payer: Self-pay | Admitting: Internal Medicine

## 2014-05-23 ENCOUNTER — Other Ambulatory Visit: Payer: Self-pay | Admitting: Internal Medicine

## 2014-05-24 MED ORDER — ALPRAZOLAM 0.25 MG PO TABS: 0.2500 mg | ORAL_TABLET | Freq: Every day | ORAL | Status: DC | PRN

## 2014-05-24 NOTE — Telephone Encounter (Signed)
Faxed to pharmacy

## 2014-05-24 NOTE — Telephone Encounter (Signed)
Last refill and OV 04/14/14, ok refill?

## 2014-05-28 ENCOUNTER — Encounter: Payer: Self-pay | Admitting: Internal Medicine

## 2014-05-29 MED ORDER — TRAZODONE HCL 50 MG PO TABS: ORAL_TABLET | ORAL | Status: DC

## 2014-05-29 NOTE — Telephone Encounter (Signed)
rx sent in for trazodone 50mg  1 1/2 tablet q hs #45 with one refill.

## 2014-06-12 ENCOUNTER — Encounter: Payer: Self-pay | Admitting: Internal Medicine

## 2014-06-13 ENCOUNTER — Other Ambulatory Visit: Payer: Self-pay | Admitting: *Deleted

## 2014-06-13 MED ORDER — METFORMIN HCL 1000 MG PO TABS: 1000.0000 mg | ORAL_TABLET | Freq: Two times a day (BID) | ORAL | Status: DC

## 2014-06-19 ENCOUNTER — Ambulatory Visit: Payer: Self-pay | Admitting: Unknown Physician Specialty

## 2014-06-21 ENCOUNTER — Encounter: Payer: Self-pay | Admitting: Internal Medicine

## 2014-06-21 ENCOUNTER — Ambulatory Visit (INDEPENDENT_AMBULATORY_CARE_PROVIDER_SITE_OTHER): Payer: Medicare Other | Admitting: Internal Medicine

## 2014-06-21 VITALS — BP 116/67 | HR 63 | Temp 98.1°F | Ht 68.0 in | Wt 216.5 lb

## 2014-06-21 DIAGNOSIS — M5442 Lumbago with sciatica, left side: Secondary | ICD-10-CM

## 2014-06-21 DIAGNOSIS — G4733 Obstructive sleep apnea (adult) (pediatric): Secondary | ICD-10-CM

## 2014-06-21 DIAGNOSIS — E7800 Pure hypercholesterolemia, unspecified: Secondary | ICD-10-CM

## 2014-06-21 DIAGNOSIS — Z0000 Encounter for general adult medical examination without abnormal findings: Secondary | ICD-10-CM

## 2014-06-21 DIAGNOSIS — J329 Chronic sinusitis, unspecified: Secondary | ICD-10-CM

## 2014-06-21 DIAGNOSIS — E119 Type 2 diabetes mellitus without complications: Secondary | ICD-10-CM

## 2014-06-21 DIAGNOSIS — G479 Sleep disorder, unspecified: Secondary | ICD-10-CM

## 2014-06-21 DIAGNOSIS — D649 Anemia, unspecified: Secondary | ICD-10-CM

## 2014-06-21 DIAGNOSIS — K219 Gastro-esophageal reflux disease without esophagitis: Secondary | ICD-10-CM

## 2014-06-21 DIAGNOSIS — I10 Essential (primary) hypertension: Secondary | ICD-10-CM

## 2014-06-21 DIAGNOSIS — E669 Obesity, unspecified: Secondary | ICD-10-CM

## 2014-06-21 MED ORDER — PANTOPRAZOLE SODIUM 40 MG PO TBEC: 40.0000 mg | DELAYED_RELEASE_TABLET | Freq: Every day | ORAL | Status: DC

## 2014-06-21 MED ORDER — ALPRAZOLAM 0.25 MG PO TABS: 0.2500 mg | ORAL_TABLET | Freq: Every day | ORAL | Status: DC | PRN

## 2014-06-21 MED ORDER — GLIPIZIDE ER 2.5 MG PO TB24: ORAL_TABLET | ORAL | Status: DC

## 2014-06-21 NOTE — Progress Notes (Signed)
Patient ID: Ricardo Nash, male   DOB: 07-09-1945, 69 y.o.   MRN: [id number]   Subjective:    Patient ID: Ricardo Nash, male    DOB: [birth year], 69 y.o.   MRN: [id number]  HPI  Patient here for a scheduled follow up.  Has a history of diabetes, hypertension and hypercholesterolemia.  States he does not feel well.  Has been having issues with a sinus infection.  Seeing Dr Tami Ribas.  Has been on abx and prednisone.  Taking clindamycin now.  Still with pressure and congestion.  Waiting for biopsy results from Dr Tami Ribas.  Just had back surgery.  Back is doing well.  Pain is better.  After surgery, had UTI.  Treated.  Symptoms resolved.  No chest pain or tightness.  Breathing stable.  Not exercising.  Is sleeping better on trazodone.     Past Medical History  Diagnosis Date  . Hypertension   . Diabetes mellitus   . Hypercholesterolemia   . Environmental allergies   . TIA (transient ischemic attack)   . Depression   . GERD (gastroesophageal reflux disease)   . Osteoarthritis     cervical spine, lumbar spine  . Paroxysmal atrial fibrillation   . C. difficile colitis   . Goiter     intrathoracic, s/p benign biopsy (Dr Harlow Asa)  . Diverticulosis     Current Outpatient Prescriptions on File Prior to Visit  Medication Sig Dispense Refill  . amLODipine (NORVASC) 10 MG tablet Take 1 tablet (10 mg total) by mouth daily. 90 tablet 2  . aspirin 81 MG tablet Take 81 mg by mouth daily.    Marland Kitchen atorvastatin (LIPITOR) 10 MG tablet Take 1 tablet (10 mg total) by mouth daily. 90 tablet 3  . cetirizine (ZYRTEC) 10 MG tablet Take 10 mg by mouth daily.    . clopidogrel (PLAVIX) 75 MG tablet Take 1 tablet (75 mg total) by mouth daily. 90 tablet 1  . fluticasone (FLONASE) 50 MCG/ACT nasal spray Place 2 sprays into both nostrils daily. 16 g 5  . hydrochlorothiazide (HYDRODIURIL) 25 MG tablet Take 1 tablet (25 mg total) by mouth daily. 90 tablet 2  . lisinopril (PRINIVIL,ZESTRIL) 40 MG tablet Take 1  tablet (40 mg total) by mouth daily. 90 tablet 1  . magnesium oxide (MAG-OX) 400 MG tablet Take 1 tablet (400 mg total) by mouth daily. 30 tablet 0  . metFORMIN (GLUCOPHAGE) 1000 MG tablet Take 1 tablet (1,000 mg total) by mouth 2 (two) times daily with a meal. 180 tablet 3  . metoprolol succinate (TOPROL-XL) 25 MG 24 hr tablet Take 1 tablet (25 mg total) by mouth daily. 90 tablet 1  . Multiple Vitamin (MULTIVITAMIN) tablet Take 1 tablet by mouth daily.    . Omega-3 Fatty Acids (FISH OIL) 1200 MG CAPS Take by mouth 4 (four) times daily.    . pioglitazone (ACTOS) 30 MG tablet Take 1 tablet (30 mg total) by mouth daily. 90 tablet 1  . tamsulosin (FLOMAX) 0.4 MG CAPS capsule Take 0.4 mg by mouth daily with breakfast.     . traZODone (DESYREL) 50 MG tablet Take 1 1/2 tablet q hs 45 tablet 1   Current Facility-Administered Medications on File Prior to Visit  Medication Dose Route Frequency Provider Last Rate Last Dose  . methylPREDNISolone acetate (DEPO-MEDROL) injection 40 mg  40 mg Intramuscular Once Crecencio Mc, MD        Review of Systems  Constitutional: Negative for fatigue and unexpected weight change.  HENT: Positive for congestion, postnasal drip and sinus pressure.   Respiratory: Negative for cough, chest tightness and shortness of breath.   Cardiovascular: Negative for chest pain, palpitations and leg swelling.  Gastrointestinal: Negative for nausea, vomiting, abdominal pain, diarrhea and constipation.  Musculoskeletal: Positive for back pain (back pain better.  ). Negative for joint swelling.  Skin: Negative for color change and rash.  Neurological: Negative for dizziness and light-headedness.  Psychiatric/Behavioral:       Increased stress with his medical issues.  Is sleeping better on trazodone.         Objective:     Blood pressure 116/60, pulse 64  Physical Exam  Constitutional: He appears well-developed and well-nourished. No distress.  HENT:  Mouth/Throat:  Oropharynx is clear and moist.  Nares - with erythematous turbinates.  Minimal tenderness to palpation over the sinuses.    Neck: Neck supple. No thyromegaly present.  Cardiovascular: Normal rate and regular rhythm.   Pulmonary/Chest: Effort normal and breath sounds normal. No respiratory distress.  Abdominal: Soft. Bowel sounds are normal. There is no tenderness.  Musculoskeletal: He exhibits no edema.  Lymphadenopathy:    He has no cervical adenopathy.  Psychiatric: He has a normal mood and affect. His behavior is normal.    BP 116/67 mmHg  Pulse 63  Temp(Src) 98.1 F (36.7 C) (Oral)  Ht _0  (1.727 m)  Wt 216 lb 8 oz (98.204 kg)  BMI 32.93 kg/m2  SpO2 97% Wt Readings from Last 3 Encounters:  06/21/14 216 lb 8 oz (98.204 kg)  04/14/14 215 lb 4 oz (97.637 kg)  11/21/13 227 lb 8 oz (103.193 kg)     Lab Results  Component Value Date   WBC 7.2 04/05/2014   HGB 11.9* 04/05/2014   HCT 36.4* 04/05/2014   PLT 503.0* 04/05/2014   GLUCOSE 143* 03/27/2014   CHOL 158 03/27/2014   TRIG 223.0* 03/27/2014   HDL 32.00* 03/27/2014   LDLDIRECT 100.6 03/27/2014   LDLCALC 58 11/21/2013   ALT 20 03/27/2014   AST 20 03/27/2014   NA 141 03/27/2014   K 4.9 03/27/2014   CL 102 03/27/2014   CREATININE 1.0 03/27/2014   BUN 23 03/27/2014   CO2 25 03/27/2014   TSH 1.08 03/27/2014   HGBA1C 7.2* 03/27/2014   MICROALBUR 1.6 03/10/2013        Assessment & Plan:   Problem List Items Addressed This Visit    Back pain    S/p surgery.  Back is better.  Follow.        Diabetes mellitus    Sugars attached.  Overall doing well.  Continue current medication regimen.  Follow sugars.  Follow met b and a1c.        Relevant Medications   glipiZIDE (GLUCOTROL XL) 24 hr tablet   Other Relevant Orders   Hemoglobin A1c   Difficulty sleeping    Doing better on trazodone.  Follow.  Sleeping well.        GERD (gastroesophageal reflux disease)    Reflux controlled.        Relevant  Medications   pantoprazole (PROTONIX) EC tablet   Health care maintenance    Physical 07/14/13.  Colonoscopy 07/21/11.  Sees urology.        Hypercholesterolemia    Low cholesterol diet and exercise.  On atorvastatin.  Follow lipid panel and liver function tests.        Relevant Orders   Lipid panel   Hepatic function panel  Hypertension - Primary    Blood pressure doing well.  Same medication regimen.  Follow pressures.  Follow metabolic panel.       Relevant Orders   Basic metabolic panel   Obesity (BMI 30-39.9)    Diet and exercise.        Relevant Medications   glipiZIDE (GLUCOTROL XL) 24 hr tablet   Obstructive sleep apnea    Continue CPAP.       Sinusitis    On treatment.  Waiting for biopsy results.  Seeing Dr Tami Ribas.         Other Visit Diagnoses    Anemia, unspecified anemia type        Relevant Orders    CBC with Differential/Platelet    Ferritin        Einar Pheasant, MD

## 2014-06-21 NOTE — Progress Notes (Signed)
Pre visit review using our clinic review tool, if applicable. No additional management support is needed unless otherwise documented below in the visit note. 

## 2014-06-25 ENCOUNTER — Encounter: Payer: Self-pay | Admitting: Internal Medicine

## 2014-06-25 DIAGNOSIS — Z0000 Encounter for general adult medical examination without abnormal findings: Secondary | ICD-10-CM | POA: Insufficient documentation

## 2014-06-25 DIAGNOSIS — J329 Chronic sinusitis, unspecified: Secondary | ICD-10-CM | POA: Insufficient documentation

## 2014-06-25 NOTE — Assessment & Plan Note (Signed)
Low cholesterol diet and exercise.  On atorvastatin.  Follow lipid panel and liver function tests.   

## 2014-06-25 NOTE — Assessment & Plan Note (Signed)
Blood pressure doing well.  Same medication regimen.  Follow pressures.  Follow metabolic panel.   

## 2014-06-25 NOTE — Assessment & Plan Note (Signed)
Continue CPAP.  

## 2014-06-25 NOTE — Assessment & Plan Note (Signed)
Physical 07/14/13.  Colonoscopy 07/21/11.  Sees urology.

## 2014-06-25 NOTE — Assessment & Plan Note (Signed)
Sugars attached.  Overall doing well.  Continue current medication regimen.  Follow sugars.  Follow met b and a1c.

## 2014-06-25 NOTE — Assessment & Plan Note (Signed)
Reflux controlled

## 2014-06-25 NOTE — Assessment & Plan Note (Signed)
Doing better on trazodone.  Follow.  Sleeping well.

## 2014-06-25 NOTE — Assessment & Plan Note (Signed)
S/p surgery.  Back is better.  Follow.  

## 2014-06-25 NOTE — Assessment & Plan Note (Signed)
On treatment.  Waiting for biopsy results.  Seeing Dr Tami Ribas.

## 2014-06-25 NOTE — Assessment & Plan Note (Signed)
Diet and exercise.   

## 2014-06-28 ENCOUNTER — Encounter: Payer: Self-pay | Admitting: Internal Medicine

## 2014-06-28 ENCOUNTER — Other Ambulatory Visit (INDEPENDENT_AMBULATORY_CARE_PROVIDER_SITE_OTHER): Payer: Medicare Other

## 2014-06-28 DIAGNOSIS — D649 Anemia, unspecified: Secondary | ICD-10-CM

## 2014-06-28 DIAGNOSIS — E7800 Pure hypercholesterolemia, unspecified: Secondary | ICD-10-CM

## 2014-06-28 DIAGNOSIS — I10 Essential (primary) hypertension: Secondary | ICD-10-CM

## 2014-06-28 DIAGNOSIS — E119 Type 2 diabetes mellitus without complications: Secondary | ICD-10-CM

## 2014-06-28 LAB — BASIC METABOLIC PANEL
BUN: 17 mg/dL (ref 6–23)
CHLORIDE: 101 meq/L (ref 96–112)
CO2: 29 meq/L (ref 19–32)
Calcium: 9.9 mg/dL (ref 8.4–10.5)
Creatinine, Ser: 0.99 mg/dL (ref 0.40–1.50)
GFR: 79.41 mL/min (ref >60.00)
Glucose, Bld: 140 mg/dL — ABNORMAL HIGH (ref 70–99)
POTASSIUM: 4.1 meq/L (ref 3.5–5.1)
Sodium: 139 mEq/L (ref 135–145)

## 2014-06-28 LAB — FERRITIN: Ferritin: 14.3 ng/mL — ABNORMAL LOW (ref 22.0–322.0)

## 2014-06-28 LAB — LIPID PANEL
Cholesterol: 164 mg/dL (ref 0–200)
HDL: 36.1 mg/dL — ABNORMAL LOW (ref >39.00)
NONHDL: 127.9
Total CHOL/HDL Ratio: 5
Triglycerides: 266 mg/dL — ABNORMAL HIGH (ref 0.0–149.0)
VLDL: 53.2 mg/dL — AB (ref 0.0–40.0)

## 2014-06-28 LAB — CBC WITH DIFFERENTIAL/PLATELET
Basophils Absolute: 0 10*3/uL (ref 0.0–0.1)
Basophils Relative: 0.5 % (ref 0.0–3.0)
EOS ABS: 0.2 10*3/uL (ref 0.0–0.7)
Eosinophils Relative: 2.8 % (ref 0.0–5.0)
HCT: 39 % (ref 39.0–52.0)
HEMOGLOBIN: 13 g/dL (ref 13.0–17.0)
Lymphocytes Relative: 24.9 % (ref 12.0–46.0)
Lymphs Abs: 1.9 10*3/uL (ref 0.7–4.0)
MCHC: 33.3 g/dL (ref 30.0–36.0)
MCV: 83.3 fl (ref 78.0–100.0)
Monocytes Absolute: 0.5 10*3/uL (ref 0.1–1.0)
Monocytes Relative: 6.3 % (ref 3.0–12.0)
NEUTROS ABS: 5 10*3/uL (ref 1.4–7.7)
Neutrophils Relative %: 65.5 % (ref 43.0–77.0)
Platelets: 363 10*3/uL (ref 150.0–400.0)
RBC: 4.69 Mil/uL (ref 4.22–5.81)
RDW: 16.8 % — ABNORMAL HIGH (ref 11.5–15.5)
WBC: 7.6 10*3/uL (ref 4.0–10.5)

## 2014-06-28 LAB — HEPATIC FUNCTION PANEL
ALT: 22 U/L (ref 0–53)
AST: 19 U/L (ref 0–37)
Albumin: 4.1 g/dL (ref 3.5–5.2)
Alkaline Phosphatase: 44 U/L (ref 39–117)
BILIRUBIN DIRECT: 0.1 mg/dL (ref 0.0–0.3)
BILIRUBIN TOTAL: 0.6 mg/dL (ref 0.2–1.2)
Total Protein: 7.3 g/dL (ref 6.0–8.3)

## 2014-06-28 LAB — HEMOGLOBIN A1C: Hgb A1c MFr Bld: 6.9 % — ABNORMAL HIGH (ref 4.6–6.5)

## 2014-06-28 LAB — LDL CHOLESTEROL, DIRECT: Direct LDL: 90 mg/dL

## 2014-06-30 ENCOUNTER — Other Ambulatory Visit: Payer: Self-pay

## 2014-06-30 MED ORDER — GLUCOSE BLOOD VI STRP: ORAL_STRIP | Status: DC

## 2014-07-05 ENCOUNTER — Other Ambulatory Visit: Payer: Self-pay

## 2014-07-05 MED ORDER — PIOGLITAZONE HCL 30 MG PO TABS: 30.0000 mg | ORAL_TABLET | Freq: Every day | ORAL | Status: DC

## 2014-07-18 ENCOUNTER — Other Ambulatory Visit: Payer: Self-pay | Admitting: Internal Medicine

## 2014-07-19 MED ORDER — ALPRAZOLAM 0.25 MG PO TABS: 0.2500 mg | ORAL_TABLET | Freq: Every day | ORAL | Status: DC | PRN

## 2014-07-19 NOTE — Telephone Encounter (Signed)
Last visit 06/21/14, refill?

## 2014-07-19 NOTE — Telephone Encounter (Signed)
ok'd refill alprazolam #30 with no refills.

## 2014-07-19 NOTE — Telephone Encounter (Signed)
Faxed to pharmacy

## 2014-07-24 ENCOUNTER — Encounter: Payer: Self-pay | Admitting: Internal Medicine

## 2014-08-11 LAB — PSA: PSA: 0.4

## 2014-08-21 ENCOUNTER — Telehealth: Payer: Self-pay | Admitting: *Deleted

## 2014-08-21 ENCOUNTER — Other Ambulatory Visit: Payer: Self-pay | Admitting: *Deleted

## 2014-08-21 MED ORDER — FLUTICASONE PROPIONATE 50 MCG/ACT NA SUSP: 2.0000 | Freq: Every day | NASAL | Status: DC

## 2014-08-21 NOTE — Telephone Encounter (Signed)
I reviewed the hgb level from Dr [employer] records.  Could he come in and let me recheck this and check iron levels.  If so, schedule appt and let me know.  I will place the order for the labs.

## 2014-08-21 NOTE — Telephone Encounter (Signed)
Ricardo Nash called states Dr Jacqlyn Larsen advised him his Iron has dropped from 14.5 down to 12.0.  Ricardo Nash is requesting whether he needs to start taking a supplement.  Please advise

## 2014-08-21 NOTE — Telephone Encounter (Signed)
Spoke with pt, pt is scheduled for 4.13.16 at 11:30 am.

## 2014-08-23 ENCOUNTER — Ambulatory Visit (INDEPENDENT_AMBULATORY_CARE_PROVIDER_SITE_OTHER): Payer: Medicare Other | Admitting: Internal Medicine

## 2014-08-23 ENCOUNTER — Encounter: Payer: Self-pay | Admitting: Internal Medicine

## 2014-08-23 VITALS — BP 146/70 | HR 57 | Temp 97.8°F | Ht 68.0 in | Wt 223.4 lb

## 2014-08-23 DIAGNOSIS — Z23 Encounter for immunization: Secondary | ICD-10-CM

## 2014-08-23 DIAGNOSIS — D509 Iron deficiency anemia, unspecified: Secondary | ICD-10-CM

## 2014-08-23 DIAGNOSIS — I10 Essential (primary) hypertension: Secondary | ICD-10-CM | POA: Diagnosis not present

## 2014-08-23 DIAGNOSIS — D649 Anemia, unspecified: Secondary | ICD-10-CM | POA: Diagnosis not present

## 2014-08-23 DIAGNOSIS — K219 Gastro-esophageal reflux disease without esophagitis: Secondary | ICD-10-CM | POA: Diagnosis not present

## 2014-08-23 DIAGNOSIS — G479 Sleep disorder, unspecified: Secondary | ICD-10-CM

## 2014-08-23 DIAGNOSIS — E119 Type 2 diabetes mellitus without complications: Secondary | ICD-10-CM

## 2014-08-23 DIAGNOSIS — Z79899 Other long term (current) drug therapy: Secondary | ICD-10-CM

## 2014-08-23 DIAGNOSIS — M5442 Lumbago with sciatica, left side: Secondary | ICD-10-CM

## 2014-08-23 DIAGNOSIS — M7989 Other specified soft tissue disorders: Secondary | ICD-10-CM

## 2014-08-23 LAB — CBC WITH DIFFERENTIAL/PLATELET
Basophils Absolute: 0 10*3/uL (ref 0.0–0.1)
Basophils Relative: 0.5 % (ref 0.0–3.0)
Eosinophils Absolute: 0 10*3/uL (ref 0.0–0.7)
Eosinophils Relative: 0.6 % (ref 0.0–5.0)
HCT: 37.4 % — ABNORMAL LOW (ref 39.0–52.0)
HEMOGLOBIN: 12.6 g/dL — AB (ref 13.0–17.0)
LYMPHS ABS: 1.5 10*3/uL (ref 0.7–4.0)
LYMPHS PCT: 22 % (ref 12.0–46.0)
MCHC: 33.7 g/dL (ref 30.0–36.0)
MCV: 82.2 fl (ref 78.0–100.0)
MONO ABS: 0.3 10*3/uL (ref 0.1–1.0)
MONOS PCT: 4.9 % (ref 3.0–12.0)
NEUTROS PCT: 72 % (ref 43.0–77.0)
Neutro Abs: 4.9 10*3/uL (ref 1.4–7.7)
PLATELETS: 300 10*3/uL (ref 150.0–400.0)
RBC: 4.55 Mil/uL (ref 4.22–5.81)
RDW: 16 % — AB (ref 11.5–15.5)
WBC: 6.8 10*3/uL (ref 4.0–10.5)

## 2014-08-23 LAB — IBC PANEL
Iron: 70 ug/dL (ref 42–165)
SATURATION RATIOS: 14.5 % — AB (ref 20.0–50.0)
TRANSFERRIN: 345 mg/dL (ref 212.0–360.0)

## 2014-08-23 LAB — VITAMIN B12: Vitamin B-12: 868 pg/mL (ref 211–911)

## 2014-08-23 LAB — HM DIABETES FOOT EXAM

## 2014-08-23 LAB — FERRITIN: Ferritin: 7.3 ng/mL — ABNORMAL LOW (ref 22.0–322.0)

## 2014-08-23 NOTE — Progress Notes (Signed)
Patient ID: Ricardo Nash, male   DOB: [birth year], 69 y.o.   MRN: [id number]   Subjective:    Patient ID: Ricardo Nash, male    DOB: [birth year], 69 y.o.   MRN: [id number]  HPI  Patient here as a work in to discuss recent anemia.  States his back is doing better since his surgery.  Seeing Dr Jacqlyn Larsen.  Recent labs revealed anemia.  Denies any blood in his stool.  Decreased energy.  Trying to watch his diet.  No added salt.  He is back at the gym.  Walking one mile per day.  Blood sugars are doing well.  States averaging 135.  Blood pressure checks averaging 681L systolic.  The last couple of weeks, he has noticed some swelling in his feet and ankles.  Some constipation.  Has noticed some tingling in his feet.      Past Medical History  Diagnosis Date  . Hypertension   . Diabetes mellitus   . Hypercholesterolemia   . Environmental allergies   . TIA (transient ischemic attack)   . Depression   . GERD (gastroesophageal reflux disease)   . Osteoarthritis     cervical spine, lumbar spine  . Paroxysmal atrial fibrillation   . C. difficile colitis   . Goiter     intrathoracic, s/p benign biopsy (Dr Harlow Asa)  . Diverticulosis      Current Outpatient Prescriptions on File Prior to Visit  Medication Sig Dispense Refill  . ALPRAZolam (XANAX) 0.25 MG tablet Take 1 tablet (0.25 mg total) by mouth daily as needed. 30 tablet 0  . amLODipine (NORVASC) 10 MG tablet Take 1 tablet (10 mg total) by mouth daily. 90 tablet 2  . aspirin 81 MG tablet Take 81 mg by mouth daily.    Marland Kitchen atorvastatin (LIPITOR) 10 MG tablet Take 1 tablet (10 mg total) by mouth daily. 90 tablet 3  . cetirizine (ZYRTEC) 10 MG tablet Take 10 mg by mouth daily.    . clopidogrel (PLAVIX) 75 MG tablet Take 1 tablet (75 mg total) by mouth daily. 90 tablet 1  . fluticasone (FLONASE) 50 MCG/ACT nasal spray Place 2 sprays into both nostrils daily. 16 g 5  . glipiZIDE (GLUCOTROL XL) 2.5 MG 24 hr tablet Take 3 tablets by mouth every  morning 270 tablet 1  . glucose blood test strip USE TO TEST BLOOD SUGAR TWICE DAILY. ACCU-CHEK AVIVA PLUS 100 each 6  . hydrochlorothiazide (HYDRODIURIL) 25 MG tablet Take 1 tablet (25 mg total) by mouth daily. 90 tablet 2  . lisinopril (PRINIVIL,ZESTRIL) 40 MG tablet Take 1 tablet (40 mg total) by mouth daily. 90 tablet 1  . magnesium oxide (MAG-OX) 400 MG tablet Take 1 tablet (400 mg total) by mouth daily. 30 tablet 0  . metFORMIN (GLUCOPHAGE) 1000 MG tablet Take 1 tablet (1,000 mg total) by mouth 2 (two) times daily with a meal. 180 tablet 3  . metoprolol succinate (TOPROL-XL) 25 MG 24 hr tablet Take 1 tablet (25 mg total) by mouth daily. 90 tablet 1  . Multiple Vitamin (MULTIVITAMIN) tablet Take 1 tablet by mouth daily.    . Omega-3 Fatty Acids (FISH OIL) 1200 MG CAPS Take by mouth 4 (four) times daily.    . pantoprazole (PROTONIX) 40 MG tablet Take 1 tablet (40 mg total) by mouth daily. 90 tablet 3  . pioglitazone (ACTOS) 30 MG tablet Take 1 tablet (30 mg total) by mouth daily. 90 tablet 1  . tamsulosin (FLOMAX) 0.4 MG CAPS  capsule Take 0.4 mg by mouth daily with breakfast.     . traZODone (DESYREL) 50 MG tablet Take 1 1/2 tablet q hs 45 tablet 1   Current Facility-Administered Medications on File Prior to Visit  Medication Dose Route Frequency Provider Last Rate Last Dose  . methylPREDNISolone acetate (DEPO-MEDROL) injection 40 mg  40 mg Intramuscular Once Crecencio Mc, MD        Review of Systems  Constitutional: Positive for fatigue. Negative for appetite change and unexpected weight change.  HENT: Negative for congestion and sinus pressure.   Respiratory: Negative for cough, chest tightness and shortness of breath.   Cardiovascular: Positive for leg swelling (previous leg swelling). Negative for chest pain and palpitations.  Gastrointestinal: Positive for constipation. Negative for nausea, vomiting, abdominal pain and diarrhea.  Genitourinary: Negative for dysuria and difficulty  urinating.  Musculoskeletal: Positive for back pain (better.). Negative for joint swelling.  Skin: Negative for color change and rash.  Neurological: Negative for dizziness, light-headedness and headaches.  Psychiatric/Behavioral: Negative for dysphoric mood and agitation.       Objective:     Blood pressure recheck;  138/62  Physical Exam  Constitutional: He appears well-developed and well-nourished. No distress.  HENT:  Nose: Nose normal.  Mouth/Throat: Oropharynx is clear and moist.  Neck: Neck supple. No thyromegaly present.  Cardiovascular: Normal rate and regular rhythm.   Pulmonary/Chest: Effort normal and breath sounds normal. No respiratory distress.  Abdominal: Soft. Bowel sounds are normal. There is no tenderness.  Musculoskeletal: He exhibits no edema.  Lymphadenopathy:    He has no cervical adenopathy.  Skin: No rash noted. No erythema.  Psychiatric: He has a normal mood and affect. His behavior is normal.    BP 146/70 mmHg  Pulse 57  Temp(Src) 97.8 F (36.6 C) (Oral)  Ht '5\' 8"'  (1.727 m)  Wt 223 lb 6 oz (101.322 kg)  BMI 33.97 kg/m2  SpO2 95% Wt Readings from Last 3 Encounters:  08/23/14 223 lb 6 oz (101.322 kg)  06/21/14 216 lb 8 oz (98.204 kg)  04/14/14 215 lb 4 oz (97.637 kg)     Lab Results  Component Value Date   WBC 6.8 08/23/2014   HGB 12.6* 08/23/2014   HCT 37.4* 08/23/2014   PLT 300.0 08/23/2014   GLUCOSE 140* 06/28/2014   CHOL 164 06/28/2014   TRIG 266.0* 06/28/2014   HDL 36.10* 06/28/2014   LDLDIRECT 90.0 06/28/2014   LDLCALC 58 11/21/2013   ALT 22 06/28/2014   AST 19 06/28/2014   NA 139 06/28/2014   K 4.1 06/28/2014   CL 101 06/28/2014   CREATININE 0.99 06/28/2014   BUN 17 06/28/2014   CO2 29 06/28/2014   TSH 1.08 03/27/2014   PSA 0.4 08/11/2014   HGBA1C 6.9* 06/28/2014   MICROALBUR 1.6 03/10/2013       Assessment & Plan:   Problem List Items Addressed This Visit    Anemia, iron deficiency    Recheck cbc, iron studies  and B12.       Relevant Medications   Fe Fum-FePoly-Vit C-Vit B3 (INTEGRA) 62.5-62.5-40-3 MG CAPS   Back pain    Better s/p surgery.  Follow.        Diabetes mellitus    Sugars as outlined.  Continue diet and exercise.  Follow met b and a1c.        Difficulty sleeping    Sleeping well with trazodone.       GERD (gastroesophageal reflux disease)  Reports no upper symptoms.  Continues on protonix.        Hypertension    Blood pressure doing well.  Same medication regimen.  Follow pressures.  Follow metabolic panel.        Swelling of lower extremity    Better now.  Follow.        Other Visit Diagnoses    Anemia, unspecified anemia type    -  Primary    Relevant Medications    Fe Fum-FePoly-Vit C-Vit B3 (INTEGRA) 62.5-62.5-40-3 MG CAPS    Other Relevant Orders    CBC with Differential/Platelet (Completed)    Vitamin B12 (Completed)    IBC panel (Completed)    Ferritin (Completed)    Need for prophylactic vaccination against Streptococcus pneumoniae (pneumococcus)        Relevant Orders    Pneumococcal polysaccharide vaccine 23-valent greater than or equal to 2yo subcutaneous/IM (Completed)        Einar Pheasant, MD

## 2014-08-23 NOTE — Patient Instructions (Signed)
miralax daily

## 2014-08-23 NOTE — Progress Notes (Signed)
Pre visit review using our clinic review tool, if applicable. No additional management support is needed unless otherwise documented below in the visit note. 

## 2014-08-24 ENCOUNTER — Encounter: Payer: Self-pay | Admitting: Internal Medicine

## 2014-08-24 MED ORDER — INTEGRA 62.5-62.5-40-3 MG PO CAPS: 1.0000 | ORAL_CAPSULE | Freq: Every day | ORAL | Status: DC

## 2014-08-28 DIAGNOSIS — M7989 Other specified soft tissue disorders: Secondary | ICD-10-CM | POA: Insufficient documentation

## 2014-08-28 DIAGNOSIS — D509 Iron deficiency anemia, unspecified: Secondary | ICD-10-CM | POA: Insufficient documentation

## 2014-08-28 NOTE — Assessment & Plan Note (Signed)
Better now.  Follow.

## 2014-08-28 NOTE — Assessment & Plan Note (Signed)
Sleeping well with trazodone.

## 2014-08-28 NOTE — Assessment & Plan Note (Signed)
Better s/p surgery.  Follow.

## 2014-08-28 NOTE — Assessment & Plan Note (Signed)
Reports no upper symptoms.  Continues on protonix.

## 2014-08-28 NOTE — Assessment & Plan Note (Signed)
Recheck cbc, iron studies and B12.

## 2014-08-28 NOTE — Assessment & Plan Note (Signed)
Blood pressure doing well.  Same medication regimen.  Follow pressures.  Follow metabolic panel.   

## 2014-08-28 NOTE — Assessment & Plan Note (Signed)
Sugars as outlined.  Continue diet and exercise.  Follow met b and a1c.

## 2014-09-06 ENCOUNTER — Encounter: Payer: Self-pay | Admitting: Internal Medicine

## 2014-09-06 ENCOUNTER — Ambulatory Visit (INDEPENDENT_AMBULATORY_CARE_PROVIDER_SITE_OTHER): Payer: Medicare Other | Admitting: Internal Medicine

## 2014-09-06 VITALS — BP 130/60 | HR 58 | Temp 98.1°F | Ht 68.0 in | Wt 226.5 lb

## 2014-09-06 DIAGNOSIS — I10 Essential (primary) hypertension: Secondary | ICD-10-CM

## 2014-09-06 DIAGNOSIS — E7800 Pure hypercholesterolemia, unspecified: Secondary | ICD-10-CM

## 2014-09-06 DIAGNOSIS — G4733 Obstructive sleep apnea (adult) (pediatric): Secondary | ICD-10-CM

## 2014-09-06 DIAGNOSIS — G479 Sleep disorder, unspecified: Secondary | ICD-10-CM

## 2014-09-06 DIAGNOSIS — E010 Iodine-deficiency related diffuse (endemic) goiter: Secondary | ICD-10-CM

## 2014-09-06 DIAGNOSIS — E119 Type 2 diabetes mellitus without complications: Secondary | ICD-10-CM

## 2014-09-06 DIAGNOSIS — K219 Gastro-esophageal reflux disease without esophagitis: Secondary | ICD-10-CM

## 2014-09-06 DIAGNOSIS — Z0000 Encounter for general adult medical examination without abnormal findings: Secondary | ICD-10-CM | POA: Diagnosis not present

## 2014-09-06 DIAGNOSIS — E049 Nontoxic goiter, unspecified: Secondary | ICD-10-CM

## 2014-09-06 DIAGNOSIS — E669 Obesity, unspecified: Secondary | ICD-10-CM

## 2014-09-06 DIAGNOSIS — G459 Transient cerebral ischemic attack, unspecified: Secondary | ICD-10-CM

## 2014-09-06 DIAGNOSIS — D509 Iron deficiency anemia, unspecified: Secondary | ICD-10-CM

## 2014-09-06 MED ORDER — TRAMADOL HCL 50 MG PO TABS: 50.0000 mg | ORAL_TABLET | Freq: Two times a day (BID) | ORAL | Status: DC | PRN

## 2014-09-06 MED ORDER — TRAZODONE HCL 50 MG PO TABS: ORAL_TABLET | ORAL | Status: DC

## 2014-09-06 MED ORDER — ALPRAZOLAM 0.25 MG PO TABS: 0.2500 mg | ORAL_TABLET | Freq: Every day | ORAL | Status: DC | PRN

## 2014-09-06 NOTE — Progress Notes (Signed)
Pre visit review using our clinic review tool, if applicable. No additional management support is needed unless otherwise documented below in the visit note. 

## 2014-09-06 NOTE — Progress Notes (Signed)
Patient ID: Ricardo Nash, male   DOB: 28-Dec-1945, 69 y.o.   MRN: [id number]   Subjective:    Patient ID: Ricardo Nash, male    DOB: 12/26/1945, 69 y.o.   MRN: [id number]  HPI  Patient here for a scheduled physical exam.  Sees urology for his prostate checks.  We followed up on his other medical issues.  Has been having problems with his left shoulder.  Due to see Dr Drema Dallas soon.  He is still taking trazodone.  Sleeping well with this.  Trying to stay active.  No cardiac symptoms with increased activity or exertion.  Breathing stable.  Congestion better.  Bowel movments normal.  Takes miralax qod.  Blood pressure checks - 130/60s.  Has hearing aids.  Doing well with this.  Going to the gym.    Past Medical History  Diagnosis Date  . Hypertension   . Diabetes mellitus   . Hypercholesterolemia   . Environmental allergies   . TIA (transient ischemic attack)   . Depression   . GERD (gastroesophageal reflux disease)   . Osteoarthritis     cervical spine, lumbar spine  . Paroxysmal atrial fibrillation   . C. difficile colitis   . Goiter     intrathoracic, s/p benign biopsy (Dr Harlow Asa)  . Diverticulosis     Outpatient Encounter Prescriptions as of 09/06/2014  Medication Sig  . amLODipine (NORVASC) 10 MG tablet Take 1 tablet (10 mg total) by mouth daily.  Marland Kitchen aspirin 81 MG tablet Take 81 mg by mouth daily.  Marland Kitchen atorvastatin (LIPITOR) 10 MG tablet Take 1 tablet (10 mg total) by mouth daily.  . cetirizine (ZYRTEC) 10 MG tablet Take 10 mg by mouth daily.  . clopidogrel (PLAVIX) 75 MG tablet Take 1 tablet (75 mg total) by mouth daily.  . Fe Fum-FePoly-Vit C-Vit B3 (INTEGRA) 62.5-62.5-40-3 MG CAPS Take 1 capsule by mouth daily.  . fluticasone (FLONASE) 50 MCG/ACT nasal spray Place 2 sprays into both nostrils daily.  Marland Kitchen glipiZIDE (GLUCOTROL XL) 2.5 MG 24 hr tablet Take 3 tablets by mouth every morning  . glucose blood test strip USE TO TEST BLOOD SUGAR TWICE DAILY. ACCU-CHEK AVIVA PLUS  .  hydrochlorothiazide (HYDRODIURIL) 25 MG tablet Take 1 tablet (25 mg total) by mouth daily.  Marland Kitchen lisinopril (PRINIVIL,ZESTRIL) 40 MG tablet Take 1 tablet (40 mg total) by mouth daily.  . magnesium oxide (MAG-OX) 400 MG tablet Take 1 tablet (400 mg total) by mouth daily.  . metFORMIN (GLUCOPHAGE) 1000 MG tablet Take 1 tablet (1,000 mg total) by mouth 2 (two) times daily with a meal.  . metoprolol succinate (TOPROL-XL) 25 MG 24 hr tablet Take 1 tablet (25 mg total) by mouth daily.  . Multiple Vitamin (MULTIVITAMIN) tablet Take 1 tablet by mouth daily.  . Omega-3 Fatty Acids (FISH OIL) 1200 MG CAPS Take by mouth 4 (four) times daily.  . pantoprazole (PROTONIX) 40 MG tablet Take 1 tablet (40 mg total) by mouth daily.  . pioglitazone (ACTOS) 30 MG tablet Take 1 tablet (30 mg total) by mouth daily.  . sildenafil (REVATIO) 20 MG tablet Take 20 mg by mouth as needed.  . tamsulosin (FLOMAX) 0.4 MG CAPS capsule Take 0.4 mg by mouth daily with breakfast.   . testosterone cypionate (DEPOTESTOTERONE CYPIONATE) 200 MG/ML injection Inject 200 mg into the muscle every 14 (fourteen) days.  Marland Kitchen tiZANidine (ZANAFLEX) 2 MG tablet Take 2 mg by mouth 3 (three) times daily as needed.  . [DISCONTINUED] ALPRAZolam (XANAX) 0.25  MG tablet Take 1 tablet (0.25 mg total) by mouth daily as needed.  . [DISCONTINUED] traZODone (DESYREL) 50 MG tablet Take 1 1/2 tablet q hs  . ALPRAZolam (XANAX) 0.25 MG tablet Take 1 tablet (0.25 mg total) by mouth daily as needed.  . traMADol (ULTRAM) 50 MG tablet Take 1 tablet (50 mg total) by mouth 2 (two) times daily as needed.  . traZODone (DESYREL) 50 MG tablet Take 1 1/2 tablet q hs   Facility-Administered Encounter Medications as of 09/06/2014  Medication  . methylPREDNISolone acetate (DEPO-MEDROL) injection 40 mg    Review of Systems  Constitutional: Negative for appetite change and unexpected weight change.  HENT: Negative for congestion and sinus pressure.   Eyes: Negative for pain and  visual disturbance.  Respiratory: Negative for cough, chest tightness and shortness of breath.   Cardiovascular: Negative for chest pain, palpitations and leg swelling.  Gastrointestinal: Negative for nausea, vomiting, abdominal pain, diarrhea and constipation.  Genitourinary: Negative for dysuria and difficulty urinating.  Musculoskeletal: Negative for back pain and joint swelling.       Persistent left shoulder pain.  Due to see ortho soon.   Skin: Negative for color change and rash.  Neurological: Negative for dizziness, light-headedness and headaches.  Hematological: Negative for adenopathy. Does not bruise/bleed easily.  Psychiatric/Behavioral: Negative for dysphoric mood and agitation.       Objective:     Blood pressure recheck:  132/60  Physical Exam  Constitutional: He is oriented to person, place, and time. He appears well-developed and well-nourished. No distress.  HENT:  Head: Normocephalic and atraumatic.  Nose: Nose normal.  Mouth/Throat: Oropharynx is clear and moist. No oropharyngeal exudate.  Eyes: Conjunctivae are normal. Right eye exhibits no discharge. Left eye exhibits no discharge.  Neck: Neck supple. No thyromegaly present.  Cardiovascular: Normal rate and regular rhythm.   Pulmonary/Chest: Breath sounds normal. No respiratory distress. He has no wheezes.  Abdominal: Soft. Bowel sounds are normal. There is no tenderness.  Genitourinary:  Performed by urology.   Musculoskeletal: He exhibits no edema or tenderness.  Lymphadenopathy:    He has no cervical adenopathy.  Neurological: He is alert and oriented to person, place, and time.  Skin: Skin is warm and dry. No rash noted.  Psychiatric: He has a normal mood and affect. His behavior is normal.    BP 130/60 mmHg  Pulse 58  Temp(Src) 98.1 F (36.7 C) (Oral)  Ht _0  (1.727 m)  Wt 226 lb 8 oz (102.74 kg)  BMI 34.45 kg/m2  SpO2 96% Wt Readings from Last 3 Encounters:  09/15/14 225 lb 4 oz (102.173  kg)  09/06/14 226 lb 8 oz (102.74 kg)  08/23/14 223 lb 6 oz (101.322 kg)     Lab Results  Component Value Date   WBC 6.8 08/23/2014   HGB 12.6* 08/23/2014   HCT 37.4* 08/23/2014   PLT 300.0 08/23/2014   GLUCOSE 140* 06/28/2014   CHOL 164 06/28/2014   TRIG 266.0* 06/28/2014   HDL 36.10* 06/28/2014   LDLDIRECT 90.0 06/28/2014   LDLCALC 58 11/21/2013   ALT 22 06/28/2014   AST 19 06/28/2014   NA 139 06/28/2014   K 4.1 06/28/2014   CL 101 06/28/2014   CREATININE 0.99 06/28/2014   BUN 17 06/28/2014   CO2 29 06/28/2014   TSH 1.08 03/27/2014   PSA 0.4 08/11/2014   HGBA1C 6.9* 06/28/2014   MICROALBUR 1.6 03/10/2013       Assessment & Plan:  Problem List Items Addressed This Visit    Anemia, iron deficiency    Follow cbc.       Relevant Orders   CBC with Differential/Platelet   Ferritin   Diabetes mellitus    Sugars doing well.  Same medication regimen.  Follow met b and a1c.   Lab Results  Component Value Date   HGBA1C 6.9* 06/28/2014        Relevant Orders   Hemoglobin A1c   Difficulty sleeping    Doing well with trazodone.  Follow.        GERD (gastroesophageal reflux disease)    No upper GI symptoms reported.  On protonix.        Health care maintenance    Physical 09/06/14.  Prostate checks and PSA followed by urology. Colonoscopy 07/21/11.        Hypercholesterolemia    Low cholesterol diet and exercise.  Follow lipid panel and liver function tests.  On lipitor.        Relevant Medications   sildenafil (REVATIO) 20 MG tablet   Other Relevant Orders   Lipid panel   Hepatic function panel   Hypertension - Primary    Blood pressure doing well.  Same medication regimen.  Follow pressures.  Follow metabolic panel.        Relevant Medications   sildenafil (REVATIO) 20 MG tablet   Other Relevant Orders   Basic metabolic panel   Intrathoracic goiter    Evaluated by Dr Harlow Asa.  S/p biopsy.  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  Follow.        Obesity (BMI 30-39.9)    Diet and exercise.        Obstructive sleep apnea    Continue CPAP.       TIA (transient ischemic attack)    No reoccurring symptoms.  Continue aspirin daily.        Relevant Medications   sildenafil (REVATIO) 20 MG tablet     I spent 25 minutes with the patient and more than 50% of the time was spent in consultation regarding the above.     Einar Pheasant, MD

## 2014-09-13 ENCOUNTER — Encounter: Payer: Self-pay | Admitting: Internal Medicine

## 2014-09-14 NOTE — Telephone Encounter (Signed)
Spoke to pt, appt scheduled 09/15/14 with Dr. Nicki Reaper, pt ok to wait until then.

## 2014-09-15 ENCOUNTER — Encounter: Payer: Self-pay | Admitting: Internal Medicine

## 2014-09-15 ENCOUNTER — Ambulatory Visit (INDEPENDENT_AMBULATORY_CARE_PROVIDER_SITE_OTHER): Payer: Medicare Other | Admitting: Internal Medicine

## 2014-09-15 VITALS — BP 148/71 | HR 69 | Temp 98.0°F | Ht 68.0 in | Wt 225.2 lb

## 2014-09-15 DIAGNOSIS — I10 Essential (primary) hypertension: Secondary | ICD-10-CM | POA: Diagnosis not present

## 2014-09-15 DIAGNOSIS — R21 Rash and other nonspecific skin eruption: Secondary | ICD-10-CM

## 2014-09-15 DIAGNOSIS — E119 Type 2 diabetes mellitus without complications: Secondary | ICD-10-CM | POA: Diagnosis not present

## 2014-09-15 MED ORDER — TRIAMCINOLONE ACETONIDE 0.1 % EX CREA: 1.0000 "application " | TOPICAL_CREAM | Freq: Two times a day (BID) | CUTANEOUS | Status: DC

## 2014-09-15 NOTE — Progress Notes (Signed)
Pre visit review using our clinic review tool, if applicable. No additional management support is needed unless otherwise documented below in the visit note. 

## 2014-09-17 ENCOUNTER — Encounter: Payer: Self-pay | Admitting: Internal Medicine

## 2014-09-17 NOTE — Assessment & Plan Note (Signed)
Continue CPAP.  

## 2014-09-17 NOTE — Assessment & Plan Note (Signed)
Low cholesterol diet and exercise.  Follow lipid panel and liver function tests.  On lipitor.   

## 2014-09-17 NOTE — Assessment & Plan Note (Signed)
Blood pressure doing well.  Same medication regimen.  Follow pressures.  Follow metabolic panel.   

## 2014-09-17 NOTE — Assessment & Plan Note (Signed)
No reoccurring symptoms.  Continue aspirin daily.

## 2014-09-17 NOTE — Assessment & Plan Note (Signed)
Doing well with trazodone.  Follow.

## 2014-09-17 NOTE — Assessment & Plan Note (Signed)
Physical 09/06/14.  Prostate checks and PSA followed by urology. Colonoscopy 07/21/11.

## 2014-09-17 NOTE — Assessment & Plan Note (Signed)
Evaluated by Dr Harlow Asa.  S/p biopsy.  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  Follow.

## 2014-09-17 NOTE — Assessment & Plan Note (Signed)
Diet and exercise.   

## 2014-09-17 NOTE — Assessment & Plan Note (Signed)
Follow cbc.  

## 2014-09-17 NOTE — Assessment & Plan Note (Signed)
No upper GI symptoms reported.  On protonix.

## 2014-09-17 NOTE — Assessment & Plan Note (Signed)
Sugars doing well.  Same medication regimen.  Follow met b and a1c.   Lab Results  Component Value Date   HGBA1C 6.9* 06/28/2014

## 2014-09-20 ENCOUNTER — Encounter: Payer: Self-pay | Admitting: Internal Medicine

## 2014-09-21 MED ORDER — TRIAMCINOLONE ACETONIDE 0.1 % EX CREA: 1.0000 "application " | TOPICAL_CREAM | Freq: Two times a day (BID) | CUTANEOUS | Status: DC

## 2014-09-21 NOTE — Telephone Encounter (Signed)
Prescription sent in for refill of TCC.

## 2014-09-24 ENCOUNTER — Encounter: Payer: Self-pay | Admitting: Internal Medicine

## 2014-09-24 DIAGNOSIS — R21 Rash and other nonspecific skin eruption: Secondary | ICD-10-CM | POA: Insufficient documentation

## 2014-09-24 NOTE — Progress Notes (Signed)
Patient ID: Ricardo Nash, male   DOB: 11/12/1945, 69 y.o.   MRN: [id number]   Subjective:    Patient ID: Ricardo Nash, male    DOB: Sep 10, 1945, 69 y.o.   MRN: [id number]  HPI  Patient here as a work in with concerns regarding rash and itching.  States cleared fence earlier this week.  Has rash on both arms and legs.  Increased itching.  Has been using benadryl and calamine lotion.    Past Medical History  Diagnosis Date  . Hypertension   . Diabetes mellitus   . Hypercholesterolemia   . Environmental allergies   . TIA (transient ischemic attack)   . Depression   . GERD (gastroesophageal reflux disease)   . Osteoarthritis     cervical spine, lumbar spine  . Paroxysmal atrial fibrillation   . C. difficile colitis   . Goiter     intrathoracic, s/p benign biopsy (Dr Harlow Asa)  . Diverticulosis     Current Outpatient Prescriptions on File Prior to Visit  Medication Sig Dispense Refill  . ALPRAZolam (XANAX) 0.25 MG tablet Take 1 tablet (0.25 mg total) by mouth daily as needed. 30 tablet 1  . amLODipine (NORVASC) 10 MG tablet Take 1 tablet (10 mg total) by mouth daily. 90 tablet 2  . aspirin 81 MG tablet Take 81 mg by mouth daily.    Marland Kitchen atorvastatin (LIPITOR) 10 MG tablet Take 1 tablet (10 mg total) by mouth daily. 90 tablet 3  . cetirizine (ZYRTEC) 10 MG tablet Take 10 mg by mouth daily.    . clopidogrel (PLAVIX) 75 MG tablet Take 1 tablet (75 mg total) by mouth daily. 90 tablet 1  . Fe Fum-FePoly-Vit C-Vit B3 (INTEGRA) 62.5-62.5-40-3 MG CAPS Take 1 capsule by mouth daily. 30 capsule 2  . fluticasone (FLONASE) 50 MCG/ACT nasal spray Place 2 sprays into both nostrils daily. 16 g 5  . glipiZIDE (GLUCOTROL XL) 2.5 MG 24 hr tablet Take 3 tablets by mouth every morning 270 tablet 1  . glucose blood test strip USE TO TEST BLOOD SUGAR TWICE DAILY. ACCU-CHEK AVIVA PLUS 100 each 6  . hydrochlorothiazide (HYDRODIURIL) 25 MG tablet Take 1 tablet (25 mg total) by mouth daily. 90 tablet 2    . lisinopril (PRINIVIL,ZESTRIL) 40 MG tablet Take 1 tablet (40 mg total) by mouth daily. 90 tablet 1  . magnesium oxide (MAG-OX) 400 MG tablet Take 1 tablet (400 mg total) by mouth daily. 30 tablet 0  . metFORMIN (GLUCOPHAGE) 1000 MG tablet Take 1 tablet (1,000 mg total) by mouth 2 (two) times daily with a meal. 180 tablet 3  . metoprolol succinate (TOPROL-XL) 25 MG 24 hr tablet Take 1 tablet (25 mg total) by mouth daily. 90 tablet 1  . Multiple Vitamin (MULTIVITAMIN) tablet Take 1 tablet by mouth daily.    . Omega-3 Fatty Acids (FISH OIL) 1200 MG CAPS Take by mouth 4 (four) times daily.    . pantoprazole (PROTONIX) 40 MG tablet Take 1 tablet (40 mg total) by mouth daily. 90 tablet 3  . pioglitazone (ACTOS) 30 MG tablet Take 1 tablet (30 mg total) by mouth daily. 90 tablet 1  . sildenafil (REVATIO) 20 MG tablet Take 20 mg by mouth as needed.    . tamsulosin (FLOMAX) 0.4 MG CAPS capsule Take 0.4 mg by mouth daily with breakfast.     . testosterone cypionate (DEPOTESTOTERONE CYPIONATE) 200 MG/ML injection Inject 200 mg into the muscle every 14 (fourteen) days.    Marland Kitchen  tiZANidine (ZANAFLEX) 2 MG tablet Take 2 mg by mouth 3 (three) times daily as needed.    . traMADol (ULTRAM) 50 MG tablet Take 1 tablet (50 mg total) by mouth 2 (two) times daily as needed. 40 tablet 0  . traZODone (DESYREL) 50 MG tablet Take 1 1/2 tablet q hs 45 tablet 1   Current Facility-Administered Medications on File Prior to Visit  Medication Dose Route Frequency Provider Last Rate Last Dose  . methylPREDNISolone acetate (DEPO-MEDROL) injection 40 mg  40 mg Intramuscular Once Crecencio Mc, MD        Review of Systems  Constitutional: Negative for appetite change and unexpected weight change.  HENT: Negative for congestion and sinus pressure.   Respiratory: Negative for cough, chest tightness and shortness of breath.   Gastrointestinal: Negative for nausea and vomiting.  Skin: Positive for rash.       Involving bilateral  arms and legs.  Itching.         Objective:    Physical Exam  Constitutional: He appears well-developed and well-nourished. No distress.  HENT:  Nose: Nose normal.  Mouth/Throat: Oropharynx is clear and moist.  Cardiovascular: Normal rate and regular rhythm.   Pulmonary/Chest: Effort normal and breath sounds normal. No respiratory distress.  Abdominal: Bowel sounds are normal.  Skin: Rash noted.  Appears to be c/w contact dermatitis.    Psychiatric: He has a normal mood and affect. His behavior is normal.    BP 148/71 mmHg  Pulse 69  Temp(Src) 98 F (36.7 C) (Oral)  Ht 5\' 8"  (1.727 m)  Wt 225 lb 4 oz (102.173 kg)  BMI 34.26 kg/m2  SpO2 98% Wt Readings from Last 3 Encounters:  09/15/14 225 lb 4 oz (102.173 kg)  09/06/14 226 lb 8 oz (102.74 kg)  08/23/14 223 lb 6 oz (101.322 kg)     Lab Results  Component Value Date   WBC 6.8 08/23/2014   HGB 12.6* 08/23/2014   HCT 37.4* 08/23/2014   PLT 300.0 08/23/2014   GLUCOSE 140* 06/28/2014   CHOL 164 06/28/2014   TRIG 266.0* 06/28/2014   HDL 36.10* 06/28/2014   LDLDIRECT 90.0 06/28/2014   LDLCALC 58 11/21/2013   ALT 22 06/28/2014   AST 19 06/28/2014   NA 139 06/28/2014   K 4.1 06/28/2014   CL 101 06/28/2014   CREATININE 0.99 06/28/2014   BUN 17 06/28/2014   CO2 29 06/28/2014   TSH 1.08 03/27/2014   PSA 0.4 08/11/2014   HGBA1C 6.9* 06/28/2014   MICROALBUR 1.6 03/10/2013       Assessment & Plan:   Problem List Items Addressed This Visit    Diabetes mellitus    Sugars controlled.  Same medication regimen.  Follow sugars on steroids.       Hypertension    Blood pressure has been well controlled.  Elevated slightly today.  Follow.        Rash - Primary    Rash c/w contact dermatitis.  Treat with medrol dose pack - 6 day taper.  Rx given to pt.  Continue antihistamine (zyrtec) daily.  Follow sugars.            Einar Pheasant, MD

## 2014-09-24 NOTE — Assessment & Plan Note (Signed)
Rash c/w contact dermatitis.  Treat with medrol dose pack - 6 day taper.  Rx given to pt.  Continue antihistamine (zyrtec) daily.  Follow sugars.

## 2014-09-24 NOTE — Assessment & Plan Note (Signed)
Sugars controlled.  Same medication regimen.  Follow sugars on steroids.

## 2014-09-24 NOTE — Assessment & Plan Note (Signed)
Blood pressure has been well controlled.  Elevated slightly today.  Follow.

## 2014-10-02 ENCOUNTER — Encounter: Payer: Self-pay | Admitting: Internal Medicine

## 2014-11-02 ENCOUNTER — Other Ambulatory Visit: Payer: Self-pay | Admitting: *Deleted

## 2014-11-02 MED ORDER — LISINOPRIL 40 MG PO TABS: 40.0000 mg | ORAL_TABLET | Freq: Every day | ORAL | Status: DC

## 2014-11-02 MED ORDER — GLIPIZIDE ER 2.5 MG PO TB24: ORAL_TABLET | ORAL | Status: DC

## 2014-11-02 MED ORDER — METOPROLOL SUCCINATE ER 25 MG PO TB24: 25.0000 mg | ORAL_TABLET | Freq: Every day | ORAL | Status: DC

## 2014-11-07 ENCOUNTER — Other Ambulatory Visit: Payer: Self-pay | Admitting: *Deleted

## 2014-11-07 MED ORDER — ALPRAZOLAM 0.25 MG PO TABS: 0.2500 mg | ORAL_TABLET | Freq: Every day | ORAL | Status: DC | PRN

## 2014-11-07 NOTE — Telephone Encounter (Signed)
Refill? Last visit 09/15/14

## 2014-11-07 NOTE — Telephone Encounter (Signed)
ok'd refill alprazolam #30 with one refill.   

## 2014-11-08 NOTE — Telephone Encounter (Signed)
Faxed to pharmacy

## 2014-11-26 ENCOUNTER — Encounter: Payer: Self-pay | Admitting: Internal Medicine

## 2014-11-27 ENCOUNTER — Other Ambulatory Visit: Payer: Self-pay | Admitting: *Deleted

## 2014-11-27 MED ORDER — TRAZODONE HCL 50 MG PO TABS: ORAL_TABLET | ORAL | Status: DC

## 2014-12-07 ENCOUNTER — Encounter: Payer: Self-pay | Admitting: Internal Medicine

## 2014-12-07 ENCOUNTER — Other Ambulatory Visit (INDEPENDENT_AMBULATORY_CARE_PROVIDER_SITE_OTHER): Payer: Medicare Other

## 2014-12-07 DIAGNOSIS — D509 Iron deficiency anemia, unspecified: Secondary | ICD-10-CM

## 2014-12-07 DIAGNOSIS — I10 Essential (primary) hypertension: Secondary | ICD-10-CM | POA: Diagnosis not present

## 2014-12-07 DIAGNOSIS — E7800 Pure hypercholesterolemia, unspecified: Secondary | ICD-10-CM

## 2014-12-07 DIAGNOSIS — E119 Type 2 diabetes mellitus without complications: Secondary | ICD-10-CM

## 2014-12-07 LAB — CBC WITH DIFFERENTIAL/PLATELET
Basophils Absolute: 0 10*3/uL (ref 0.0–0.1)
Basophils Relative: 0.4 % (ref 0.0–3.0)
EOS ABS: 0.1 10*3/uL (ref 0.0–0.7)
Eosinophils Relative: 0.8 % (ref 0.0–5.0)
HCT: 41.6 % (ref 39.0–52.0)
Hemoglobin: 13.9 g/dL (ref 13.0–17.0)
Lymphocytes Relative: 20.5 % (ref 12.0–46.0)
Lymphs Abs: 1.6 10*3/uL (ref 0.7–4.0)
MCHC: 33.3 g/dL (ref 30.0–36.0)
MCV: 84.3 fl (ref 78.0–100.0)
Monocytes Absolute: 0.4 10*3/uL (ref 0.1–1.0)
Monocytes Relative: 5.6 % (ref 3.0–12.0)
NEUTROS PCT: 72.7 % (ref 43.0–77.0)
Neutro Abs: 5.7 10*3/uL (ref 1.4–7.7)
PLATELETS: 297 10*3/uL (ref 150.0–400.0)
RBC: 4.93 Mil/uL (ref 4.22–5.81)
RDW: 16.3 % — AB (ref 11.5–15.5)
WBC: 7.9 10*3/uL (ref 4.0–10.5)

## 2014-12-07 LAB — BASIC METABOLIC PANEL
BUN: 19 mg/dL (ref 6–23)
CALCIUM: 9.6 mg/dL (ref 8.4–10.5)
CHLORIDE: 101 meq/L (ref 96–112)
CO2: 30 mEq/L (ref 19–32)
Creatinine, Ser: 0.96 mg/dL (ref 0.40–1.50)
GFR: 82.18 mL/min (ref >60.00)
Glucose, Bld: 111 mg/dL — ABNORMAL HIGH (ref 70–99)
Potassium: 4.4 mEq/L (ref 3.5–5.1)
Sodium: 139 mEq/L (ref 135–145)

## 2014-12-07 LAB — HEPATIC FUNCTION PANEL
ALBUMIN: 4.2 g/dL (ref 3.5–5.2)
ALK PHOS: 41 U/L (ref 39–117)
ALT: 22 U/L (ref 0–53)
AST: 21 U/L (ref 0–37)
BILIRUBIN DIRECT: 0.2 mg/dL (ref 0.0–0.3)
TOTAL PROTEIN: 7.1 g/dL (ref 6.0–8.3)
Total Bilirubin: 0.7 mg/dL (ref 0.2–1.2)

## 2014-12-07 LAB — LIPID PANEL
Cholesterol: 134 mg/dL (ref 0–200)
HDL: 33 mg/dL — ABNORMAL LOW (ref >39.00)
NonHDL: 100.99
Total CHOL/HDL Ratio: 4
Triglycerides: 239 mg/dL — ABNORMAL HIGH (ref 0.0–149.0)
VLDL: 47.8 mg/dL — AB (ref 0.0–40.0)

## 2014-12-07 LAB — LDL CHOLESTEROL, DIRECT: LDL DIRECT: 76 mg/dL

## 2014-12-07 LAB — FERRITIN: Ferritin: 7.7 ng/mL — ABNORMAL LOW (ref 22.0–322.0)

## 2014-12-07 LAB — HEMOGLOBIN A1C: HEMOGLOBIN A1C: 6.9 % — AB (ref 4.6–6.5)

## 2014-12-08 ENCOUNTER — Ambulatory Visit: Payer: Medicare Other | Admitting: Internal Medicine

## 2014-12-21 NOTE — Telephone Encounter (Signed)
Unread mychart message mailed patient

## 2014-12-22 ENCOUNTER — Encounter: Payer: Self-pay | Admitting: Internal Medicine

## 2014-12-22 ENCOUNTER — Ambulatory Visit (INDEPENDENT_AMBULATORY_CARE_PROVIDER_SITE_OTHER): Payer: Medicare Other | Admitting: Internal Medicine

## 2014-12-22 VITALS — BP 130/60 | HR 53 | Temp 97.7°F | Ht 68.0 in | Wt 228.4 lb

## 2014-12-22 DIAGNOSIS — R8290 Unspecified abnormal findings in urine: Secondary | ICD-10-CM | POA: Diagnosis not present

## 2014-12-22 DIAGNOSIS — M5442 Lumbago with sciatica, left side: Secondary | ICD-10-CM

## 2014-12-22 DIAGNOSIS — E669 Obesity, unspecified: Secondary | ICD-10-CM

## 2014-12-22 DIAGNOSIS — E010 Iodine-deficiency related diffuse (endemic) goiter: Secondary | ICD-10-CM

## 2014-12-22 DIAGNOSIS — I10 Essential (primary) hypertension: Secondary | ICD-10-CM | POA: Diagnosis not present

## 2014-12-22 DIAGNOSIS — E7800 Pure hypercholesterolemia, unspecified: Secondary | ICD-10-CM

## 2014-12-22 DIAGNOSIS — K219 Gastro-esophageal reflux disease without esophagitis: Secondary | ICD-10-CM | POA: Diagnosis not present

## 2014-12-22 DIAGNOSIS — R140 Abdominal distension (gaseous): Secondary | ICD-10-CM

## 2014-12-22 DIAGNOSIS — J329 Chronic sinusitis, unspecified: Secondary | ICD-10-CM

## 2014-12-22 DIAGNOSIS — E049 Nontoxic goiter, unspecified: Secondary | ICD-10-CM

## 2014-12-22 DIAGNOSIS — E119 Type 2 diabetes mellitus without complications: Secondary | ICD-10-CM

## 2014-12-22 DIAGNOSIS — D509 Iron deficiency anemia, unspecified: Secondary | ICD-10-CM

## 2014-12-22 LAB — URINALYSIS, ROUTINE W REFLEX MICROSCOPIC
Bilirubin Urine: NEGATIVE
Hgb urine dipstick: NEGATIVE
KETONES UR: NEGATIVE
LEUKOCYTES UA: NEGATIVE
Nitrite: NEGATIVE
RBC / HPF: NONE SEEN (ref 0–?)
SPECIFIC GRAVITY, URINE: 1.01 (ref 1.000–1.030)
Total Protein, Urine: NEGATIVE
UROBILINOGEN UA: 0.2 (ref 0.0–1.0)
Urine Glucose: NEGATIVE
WBC UA: NONE SEEN (ref 0–?)
pH: 5.5 (ref 5.0–8.0)

## 2014-12-22 MED ORDER — ALPRAZOLAM 0.25 MG PO TABS: 0.2500 mg | ORAL_TABLET | Freq: Every day | ORAL | Status: DC | PRN

## 2014-12-22 NOTE — Progress Notes (Signed)
Patient ID: Ricardo Nash, male   DOB: 1946/02/18, 69 y.o.   MRN: [id number]   Subjective:    Patient ID: Ricardo Nash, male    DOB: 1945/09/05, 69 y.o.   MRN: [id number]  HPI  Patient here for a scheduled follow up.  He reports persistent bloating and gas.  Is having bowel movements.  Has tried taking probiotics.  Using miralax.  Has to do the right mixture of these two - to keep bowels more regular.  Has cut out soft drinks.  Avoids spicy foods.  Still will feel distended and uncomfortable.  Abdominal discomfort.  Decreased energy.  No significant sinus pain.  Still with some congestion.  No chest congestion.  No cough.  No sob.  Strong odor with urination.  flomax has helped with urination.  Doing well with his back s/p surgery.  Evaluated in June.  Felt doing well.  Recommended f/u in 6 months.  Due to see Dr Ellin Mayhew 12/30/14.  Some dryness and eyelid drooping.  Sees Dr Phillip Heal for skin checks.     Past Medical History  Diagnosis Date  . Hypertension   . Diabetes mellitus   . Hypercholesterolemia   . Environmental allergies   . TIA (transient ischemic attack)   . Depression   . GERD (gastroesophageal reflux disease)   . Osteoarthritis     cervical spine, lumbar spine  . Paroxysmal atrial fibrillation   . C. difficile colitis   . Goiter     intrathoracic, s/p benign biopsy (Dr Harlow Asa)  . Diverticulosis    Family history and social history reviewed.    Outpatient Encounter Prescriptions as of 12/22/2014  Medication Sig  . ALPRAZolam (XANAX) 0.25 MG tablet Take 1 tablet (0.25 mg total) by mouth daily as needed.  Marland Kitchen amLODipine (NORVASC) 10 MG tablet Take 1 tablet (10 mg total) by mouth daily.  Marland Kitchen aspirin 81 MG tablet Take 81 mg by mouth daily.  Marland Kitchen atorvastatin (LIPITOR) 10 MG tablet Take 1 tablet (10 mg total) by mouth daily.  . cetirizine (ZYRTEC) 10 MG tablet Take 10 mg by mouth daily.  . clopidogrel (PLAVIX) 75 MG tablet Take 1 tablet (75 mg total) by mouth daily.  .  fluticasone (FLONASE) 50 MCG/ACT nasal spray Place 2 sprays into both nostrils daily.  Marland Kitchen glipiZIDE (GLUCOTROL XL) 2.5 MG 24 hr tablet Take 3 tablets by mouth every morning  . glucose blood test strip USE TO TEST BLOOD SUGAR TWICE DAILY. ACCU-CHEK AVIVA PLUS  . hydrochlorothiazide (HYDRODIURIL) 25 MG tablet Take 1 tablet (25 mg total) by mouth daily.  Marland Kitchen lisinopril (PRINIVIL,ZESTRIL) 40 MG tablet Take 1 tablet (40 mg total) by mouth daily.  . metFORMIN (GLUCOPHAGE) 1000 MG tablet Take 1 tablet (1,000 mg total) by mouth 2 (two) times daily with a meal.  . Multiple Vitamin (MULTIVITAMIN) tablet Take 1 tablet by mouth daily.  . Omega-3 Fatty Acids (FISH OIL) 1200 MG CAPS Take by mouth 4 (four) times daily.  . pantoprazole (PROTONIX) 40 MG tablet Take 1 tablet (40 mg total) by mouth daily.  . pioglitazone (ACTOS) 30 MG tablet Take 1 tablet (30 mg total) by mouth daily.  . sildenafil (REVATIO) 20 MG tablet Take 20 mg by mouth as needed.  . tamsulosin (FLOMAX) 0.4 MG CAPS capsule Take 0.4 mg by mouth daily with breakfast.   . testosterone cypionate (DEPOTESTOTERONE CYPIONATE) 200 MG/ML injection Inject 200 mg into the muscle every 14 (fourteen) days.  Marland Kitchen tiZANidine (ZANAFLEX) 2 MG tablet Take  2 mg by mouth 3 (three) times daily as needed.  . traMADol (ULTRAM) 50 MG tablet Take 1 tablet (50 mg total) by mouth 2 (two) times daily as needed.  . traZODone (DESYREL) 50 MG tablet Take 1 1/2 tablet q hs  . [DISCONTINUED] ALPRAZolam (XANAX) 0.25 MG tablet Take 1 tablet (0.25 mg total) by mouth daily as needed.  . [DISCONTINUED] Fe Fum-FePoly-Vit C-Vit B3 (INTEGRA) 62.5-62.5-40-3 MG CAPS Take 1 capsule by mouth daily.  . [DISCONTINUED] gabapentin (NEURONTIN) 300 MG capsule Take 300 mg by mouth.  . [DISCONTINUED] metoprolol succinate (TOPROL-XL) 25 MG 24 hr tablet Take 1 tablet (25 mg total) by mouth daily.  . magnesium oxide (MAG-OX) 400 MG tablet Take 1 tablet (400 mg total) by mouth daily. (Patient not taking:  Reported on 12/22/2014)  . [DISCONTINUED] triamcinolone cream (KENALOG) 0.1 % Apply 1 application topically 2 (two) times daily. (Patient not taking: Reported on 12/22/2014)   Facility-Administered Encounter Medications as of 12/22/2014  Medication  . methylPREDNISolone acetate (DEPO-MEDROL) injection 40 mg    Review of Systems  Constitutional: Negative for appetite change and unexpected weight change.  HENT: Positive for congestion (some congeston but no sinus pressure. ). Negative for sinus pressure and sore throat.   Eyes: Negative for discharge and visual disturbance.  Respiratory: Negative for cough, chest tightness and shortness of breath.   Cardiovascular: Negative for chest pain, palpitations and leg swelling.  Gastrointestinal: Negative for vomiting.       Abdominal bloating and distention.  Increased abdominal discomfort.  Has tried diet adjustment and probiotics.  Has not helped.    Genitourinary: Negative for dysuria and difficulty urinating.       Has noticed odor with urination.    Musculoskeletal:       Back is doing better.    Skin: Negative for color change and rash.  Neurological: Negative for dizziness, light-headedness and headaches.  Hematological: Negative for adenopathy. Does not bruise/bleed easily.  Psychiatric/Behavioral: Negative for dysphoric mood and agitation.       Objective:     Blood pressure recheck:  132/62  Physical Exam  Constitutional: He appears well-developed and well-nourished. No distress.  HENT:  Nose: Nose normal.  Mouth/Throat: Oropharynx is clear and moist.  Eyes: Conjunctivae are normal. Right eye exhibits no discharge. Left eye exhibits no discharge.  Neck: Neck supple. No thyromegaly present.  Cardiovascular: Normal rate and regular rhythm.   Pulmonary/Chest: Effort normal and breath sounds normal. No respiratory distress.  Abdominal: Soft. Bowel sounds are normal.  Some abdominal discomfort.    Musculoskeletal: He exhibits no  edema or tenderness.  Lymphadenopathy:    He has no cervical adenopathy.  Skin: No rash noted. No erythema.  Psychiatric: He has a normal mood and affect. His behavior is normal.    BP 130/60 mmHg  Pulse 53  Temp(Src) 97.7 F (36.5 C) (Oral)  Ht 5\' 8"  (1.727 m)  Wt 228 lb 6 oz (103.59 kg)  BMI 34.73 kg/m2  SpO2 97% Wt Readings from Last 3 Encounters:  12/22/14 228 lb 6 oz (103.59 kg)  09/15/14 225 lb 4 oz (102.173 kg)  09/06/14 226 lb 8 oz (102.74 kg)     Lab Results  Component Value Date   WBC 7.9 12/07/2014   HGB 13.9 12/07/2014   HCT 41.6 12/07/2014   PLT 297.0 12/07/2014   GLUCOSE 111* 12/07/2014   CHOL 134 12/07/2014   TRIG 239.0* 12/07/2014   HDL 33.00* 12/07/2014   LDLDIRECT 76.0 12/07/2014  LDLCALC 58 11/21/2013   ALT 22 12/07/2014   AST 21 12/07/2014   NA 139 12/07/2014   K 4.4 12/07/2014   CL 101 12/07/2014   CREATININE 0.96 12/07/2014   BUN 19 12/07/2014   CO2 30 12/07/2014   TSH 1.08 03/27/2014   PSA 0.4 08/11/2014   HGBA1C 6.9* 12/07/2014   MICROALBUR 1.6 03/10/2013       Assessment & Plan:   Problem List Items Addressed This Visit    Abdominal distention    Persistent abdominal discomfort, pain, distention.  No better with diet adjustment and probiotics.  Has had colonoscopy previously.  Obtain CT scan abdomen and pelvis. Will need to hold metformin for CT.        Anemia, iron deficiency    Follow cbc.       Back pain    Better s/p surgery.  Follow.        Diabetes mellitus    Sugars as outlined on attached list.  Continue current medication regimen.  Follow sugars.  a1c just checked 6.9.  Follow.  Will need to hold metformin as directed for CT scan.  Has eye exam scheduled for 12/30/14 - Dr Ellin Mayhew.        GERD (gastroesophageal reflux disease)    On protonix.  No upper symptoms.        Hypercholesterolemia    On lipitor.  Low cholesterol diet and exercise.  Follow lipid panel and liver function tests.        Hypertension     Blood pressure under good control.  Continue same medication regimen.  Follow pressures.  Follow metabolic panel.        Intrathoracic goiter    Followed by Dr Harlow Asa.  S/p biopsy.  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  Follow.        Obesity (BMI 30-39.9)    Diet and exercise.  Follow.       Sinusitis    No sinus pressure.  Some congestion.  Saline nasal spray and steroid nasal spray as directed.  Mucinex/robitussin as directed.  Follow.         Other Visit Diagnoses    Bad odor of urine    -  Primary    Relevant Orders    CULTURE, URINE COMPREHENSIVE (Completed)    Urinalysis, Routine w reflex microscopic (not at [facility]) (Completed)        Einar Pheasant, MD

## 2014-12-22 NOTE — Progress Notes (Signed)
Pre visit review using our clinic review tool, if applicable. No additional management support is needed unless otherwise documented below in the visit note. 

## 2014-12-23 LAB — CULTURE, URINE COMPREHENSIVE
Colony Count: NO GROWTH
Organism ID, Bacteria: NO GROWTH

## 2014-12-24 ENCOUNTER — Encounter: Payer: Self-pay | Admitting: Internal Medicine

## 2014-12-24 DIAGNOSIS — R140 Abdominal distension (gaseous): Secondary | ICD-10-CM | POA: Insufficient documentation

## 2014-12-24 NOTE — Assessment & Plan Note (Signed)
On protonix.  No upper symptoms.   

## 2014-12-24 NOTE — Assessment & Plan Note (Signed)
Diet and exercise.  Follow.  

## 2014-12-24 NOTE — Assessment & Plan Note (Signed)
Persistent abdominal discomfort, pain, distention.  No better with diet adjustment and probiotics.  Has had colonoscopy previously.  Obtain CT scan abdomen and pelvis. Will need to hold metformin for CT.

## 2014-12-24 NOTE — Assessment & Plan Note (Signed)
No sinus pressure.  Some congestion.  Saline nasal spray and steroid nasal spray as directed.  Mucinex/robitussin as directed.  Follow.

## 2014-12-24 NOTE — Assessment & Plan Note (Signed)
Followed by Dr Harlow Asa.  S/p biopsy.  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  Follow.

## 2014-12-24 NOTE — Assessment & Plan Note (Addendum)
Sugars as outlined on attached list.  Continue current medication regimen.  Follow sugars.  a1c just checked 6.9.  Follow.  Will need to hold metformin as directed for CT scan.  Has eye exam scheduled for 12/30/14 - Dr Ellin Mayhew.

## 2014-12-24 NOTE — Assessment & Plan Note (Signed)
Blood pressure under good control.  Continue same medication regimen.  Follow pressures.  Follow metabolic panel.   

## 2014-12-24 NOTE — Assessment & Plan Note (Signed)
On lipitor.  Low cholesterol diet and exercise.  Follow lipid panel and liver function tests.   

## 2014-12-24 NOTE — Assessment & Plan Note (Signed)
Follow cbc.  

## 2014-12-24 NOTE — Assessment & Plan Note (Signed)
Better s/p surgery.  Follow.

## 2014-12-28 ENCOUNTER — Encounter: Payer: Self-pay | Admitting: Internal Medicine

## 2014-12-29 ENCOUNTER — Other Ambulatory Visit: Payer: Self-pay | Admitting: Internal Medicine

## 2014-12-29 DIAGNOSIS — R1084 Generalized abdominal pain: Secondary | ICD-10-CM

## 2014-12-29 DIAGNOSIS — I480 Paroxysmal atrial fibrillation: Secondary | ICD-10-CM

## 2014-12-29 NOTE — Telephone Encounter (Signed)
I have placed the order for the referral to cardiology and CT.  Pt will need to hold metformin 24 hours before CT and for 48 hours after CT.  Thanks

## 2014-12-29 NOTE — Progress Notes (Signed)
Order placed for CT scan and cardiology referral.

## 2015-01-01 ENCOUNTER — Encounter: Payer: Self-pay | Admitting: Internal Medicine

## 2015-01-01 LAB — HM DIABETES EYE EXAM

## 2015-01-03 ENCOUNTER — Other Ambulatory Visit: Payer: Self-pay | Admitting: Surgical

## 2015-01-03 MED ORDER — PIOGLITAZONE HCL 30 MG PO TABS: 30.0000 mg | ORAL_TABLET | Freq: Every day | ORAL | Status: DC

## 2015-01-03 NOTE — Telephone Encounter (Signed)
RX for Actos sent to pharmacy

## 2015-01-05 ENCOUNTER — Ambulatory Visit
Admission: RE | Admit: 2015-01-05 | Discharge: 2015-01-05 | Disposition: A | Discharge status: Home / Self Care | Payer: Medicare Other | Source: Ambulatory Visit | Attending: Internal Medicine | Admitting: Internal Medicine

## 2015-01-05 ENCOUNTER — Encounter: Payer: Self-pay | Admitting: Internal Medicine

## 2015-01-05 DIAGNOSIS — K8020 Calculus of gallbladder without cholecystitis without obstruction: Secondary | ICD-10-CM | POA: Diagnosis not present

## 2015-01-05 DIAGNOSIS — R140 Abdominal distension (gaseous): Secondary | ICD-10-CM

## 2015-01-05 DIAGNOSIS — K5730 Diverticulosis of large intestine without perforation or abscess without bleeding: Secondary | ICD-10-CM | POA: Diagnosis not present

## 2015-01-05 DIAGNOSIS — R911 Solitary pulmonary nodule: Secondary | ICD-10-CM | POA: Diagnosis not present

## 2015-01-05 DIAGNOSIS — R109 Unspecified abdominal pain: Secondary | ICD-10-CM

## 2015-01-05 DIAGNOSIS — R1084 Generalized abdominal pain: Secondary | ICD-10-CM | POA: Diagnosis present

## 2015-01-05 DIAGNOSIS — M4854XA Collapsed vertebra, not elsewhere classified, thoracic region, initial encounter for fracture: Secondary | ICD-10-CM | POA: Insufficient documentation

## 2015-01-05 DIAGNOSIS — I2510 Atherosclerotic heart disease of native coronary artery without angina pectoris: Secondary | ICD-10-CM | POA: Diagnosis not present

## 2015-01-05 MED ORDER — IOHEXOL 350 MG/ML SOLN
100.0000 mL | Freq: Once | INTRAVENOUS | Status: AC | PRN
  Administered 2015-01-05: 100 mL via INTRAVENOUS

## 2015-01-06 NOTE — Telephone Encounter (Signed)
Order placed for referral to GI.  Pt notified via my chart.   

## 2015-01-08 ENCOUNTER — Other Ambulatory Visit: Payer: Self-pay

## 2015-01-08 MED ORDER — HYDROCHLOROTHIAZIDE 25 MG PO TABS: 25.0000 mg | ORAL_TABLET | Freq: Every day | ORAL | Status: DC

## 2015-01-27 ENCOUNTER — Encounter: Payer: Self-pay | Admitting: *Deleted

## 2015-02-01 ENCOUNTER — Telehealth: Payer: Self-pay | Admitting: *Deleted

## 2015-02-01 NOTE — Telephone Encounter (Signed)
Spoke with Ricardo Nash advised of MDs message

## 2015-02-01 NOTE — Telephone Encounter (Signed)
Do they have regular glipizide and not the ER or ask them what our option are that are available.   Thanks.

## 2015-02-01 NOTE — Telephone Encounter (Signed)
Spoke with Louie Casa again she states they have 5 mg and 10 mg regular and 5 mg and 10 mg ER.  Please advise

## 2015-02-01 NOTE — Telephone Encounter (Signed)
Randy from [facility] called states Glipizide 2.5mg  ER is on manufacturer until mid December.  Please advise different Rx.

## 2015-02-01 NOTE — Telephone Encounter (Signed)
Can do 1/2 of 5mg  glipizide and follow sugars.  Will see if need to adjust.  Thanks

## 2015-02-02 ENCOUNTER — Encounter: Payer: Self-pay | Admitting: Internal Medicine

## 2015-02-02 ENCOUNTER — Telehealth: Payer: Self-pay | Admitting: *Deleted

## 2015-02-02 NOTE — Telephone Encounter (Signed)
Pt called states he was taking 3  2.5mg  tablets ER of Glipizide however the new Rx was for 1/2 5mg  tablet.  Requesting whether he is to continue to take 3 1/2 tablets of regular Glipizide.  Please advise

## 2015-02-02 NOTE — Telephone Encounter (Signed)
Spoke with Ricardo Nash, pharmacist, she is correcting the Rx.  Pt is aware he is to still take 7.5 mg daily.  Verbalized understanding

## 2015-02-02 NOTE — Telephone Encounter (Signed)
He is to continue 3 (1/2) 5mg  tablet  - sorry for the confusion.  Please see if pharmacy can correct.  Call in new rx.  Thanks

## 2015-02-04 ENCOUNTER — Encounter: Payer: Self-pay | Admitting: Internal Medicine

## 2015-02-05 ENCOUNTER — Other Ambulatory Visit: Payer: Self-pay

## 2015-02-05 MED ORDER — CLOPIDOGREL BISULFATE 75 MG PO TABS: 75.0000 mg | ORAL_TABLET | Freq: Every day | ORAL | Status: DC

## 2015-02-05 MED ORDER — TRAZODONE HCL 50 MG PO TABS: ORAL_TABLET | ORAL | Status: DC

## 2015-03-05 ENCOUNTER — Encounter: Payer: Self-pay | Admitting: Internal Medicine

## 2015-03-08 ENCOUNTER — Ambulatory Visit (INDEPENDENT_AMBULATORY_CARE_PROVIDER_SITE_OTHER): Payer: Medicare Other

## 2015-03-08 DIAGNOSIS — Z23 Encounter for immunization: Secondary | ICD-10-CM | POA: Diagnosis not present

## 2015-04-04 ENCOUNTER — Ambulatory Visit (INDEPENDENT_AMBULATORY_CARE_PROVIDER_SITE_OTHER): Payer: Medicare Other | Admitting: Internal Medicine

## 2015-04-04 ENCOUNTER — Encounter: Payer: Self-pay | Admitting: Internal Medicine

## 2015-04-04 VITALS — BP 120/60 | HR 64 | Temp 98.2°F | Resp 18 | Ht 68.0 in | Wt 220.0 lb

## 2015-04-04 DIAGNOSIS — E7800 Pure hypercholesterolemia, unspecified: Secondary | ICD-10-CM

## 2015-04-04 DIAGNOSIS — E049 Nontoxic goiter, unspecified: Secondary | ICD-10-CM

## 2015-04-04 DIAGNOSIS — Z0000 Encounter for general adult medical examination without abnormal findings: Secondary | ICD-10-CM

## 2015-04-04 DIAGNOSIS — K219 Gastro-esophageal reflux disease without esophagitis: Secondary | ICD-10-CM | POA: Diagnosis not present

## 2015-04-04 DIAGNOSIS — R140 Abdominal distension (gaseous): Secondary | ICD-10-CM

## 2015-04-04 DIAGNOSIS — I10 Essential (primary) hypertension: Secondary | ICD-10-CM

## 2015-04-04 DIAGNOSIS — M7989 Other specified soft tissue disorders: Secondary | ICD-10-CM

## 2015-04-04 DIAGNOSIS — E010 Iodine-deficiency related diffuse (endemic) goiter: Secondary | ICD-10-CM

## 2015-04-04 DIAGNOSIS — E119 Type 2 diabetes mellitus without complications: Secondary | ICD-10-CM | POA: Diagnosis not present

## 2015-04-04 DIAGNOSIS — D509 Iron deficiency anemia, unspecified: Secondary | ICD-10-CM

## 2015-04-04 DIAGNOSIS — J329 Chronic sinusitis, unspecified: Secondary | ICD-10-CM | POA: Diagnosis not present

## 2015-04-04 DIAGNOSIS — M799 Soft tissue disorder, unspecified: Secondary | ICD-10-CM

## 2015-04-04 DIAGNOSIS — G479 Sleep disorder, unspecified: Secondary | ICD-10-CM

## 2015-04-04 MED ORDER — DOXYCYCLINE HYCLATE 100 MG PO TABS: 100.0000 mg | ORAL_TABLET | Freq: Two times a day (BID) | ORAL | Status: DC

## 2015-04-04 MED ORDER — AMLODIPINE BESYLATE 10 MG PO TABS: 10.0000 mg | ORAL_TABLET | Freq: Every day | ORAL | Status: DC

## 2015-04-04 MED ORDER — TRAZODONE HCL 50 MG PO TABS: ORAL_TABLET | ORAL | Status: DC

## 2015-04-04 MED ORDER — AZITHROMYCIN 250 MG PO TABS: ORAL_TABLET | ORAL | Status: DC

## 2015-04-04 MED ORDER — ALPRAZOLAM 0.25 MG PO TABS: 0.2500 mg | ORAL_TABLET | Freq: Every day | ORAL | Status: DC | PRN

## 2015-04-04 MED ORDER — TRAMADOL HCL 50 MG PO TABS: 50.0000 mg | ORAL_TABLET | Freq: Two times a day (BID) | ORAL | Status: DC | PRN

## 2015-04-04 NOTE — Progress Notes (Signed)
Patient ID: Ricardo Nash, male   DOB: 12-22-1945, 69 y.o.   MRN: [id number]   Subjective:    Patient ID: Ricardo Nash, male    DOB: 1945-11-03, 69 y.o.   MRN: [id number]  HPI  Patient with past history of hypercholesterolemia, diabetes, hypertension, allergies, depression and GERD.  He comes in today to follow up regarding his sugars.  Recently had to change his glucotrol XL to plain glipizide.  Since changing, he has not felt that his sugars have been as well controlled. Recently the sugars have improved some.  See attached list.  Overall appear to be doing relatively well. He also reports that over the past week, he has developed headache and sinus congestion and pressure.  Thick mucus production - yellow.  No chest congestion.  No sob.  No chest pain or tightness.  No acid reflux.  No abdominal pain or cramping.  Bowels stable.  Taking 2 stool softeners. This helps to keep him regular.  Recently saw opthalmology.  Diagnosed with dry eyes.  Using drops.  No diabetes changes in his eyes.  Has a persistent right ankle lesion.  Had a blister.  Is healing slowly.  No evidence of infection.    Past Medical History  Diagnosis Date  . Hypertension   . Diabetes mellitus ([place])   . Hypercholesterolemia   . Environmental allergies   . TIA (transient ischemic attack)   . Depression   . GERD (gastroesophageal reflux disease)   . Osteoarthritis     cervical spine, lumbar spine  . Paroxysmal atrial fibrillation ([facility])   . C. difficile colitis   . Goiter     intrathoracic, s/p benign biopsy (Dr Harlow Asa)  . Diverticulosis    Past Surgical History  Procedure Laterality Date  . Inguinal hernia repair  1991    Dr, Tamala Julian  . Septoplasty  1975  . Shoulder surgery  2000    rotator cuff  . Cervical disc surgery  2002  . Back surgery  8/08    s/p fusion of L4-L5  . Nose surgery      turbinate reduction  . Cataract extraction  2011    Dr. Herbert Deaner  . Eyelid reduction      Dr. Carlis Abbott   Family  History  Problem Relation Age of Onset  . Congestive Heart Failure Father   . Heart disease Father     myocardial infarction  . Rheumatic fever Father     valvular disease  . Heart disease Mother     s/p CABG (age 62)  . Colon cancer Neg Hx   . Prostate cancer Neg Hx    Social History   Social History  . Marital Status: Married    Spouse Name: N/A  . Number of Children: 2  . Years of Education: N/A   Occupational History  . retired Pharmacist, hospital    Social History Main Topics  . Smoking status: Former Research scientist (life sciences)  . Smokeless tobacco: Never Used  . Alcohol Use: 0.0 oz/week    0 Standard drinks or equivalent per week     Comment: occasional  . Drug Use: No  . Sexual Activity: Not Asked   Other Topics Concern  . None   Social History Narrative    Outpatient Encounter Prescriptions as of 04/04/2015  Medication Sig  . ALPRAZolam (XANAX) 0.25 MG tablet Take 1 tablet (0.25 mg total) by mouth daily as needed.  Marland Kitchen amLODipine (NORVASC) 10 MG tablet Take 1 tablet (10 mg total) by  mouth daily.  Marland Kitchen aspirin 81 MG tablet Take 81 mg by mouth daily.  Marland Kitchen atorvastatin (LIPITOR) 10 MG tablet Take 1 tablet (10 mg total) by mouth daily.  . cetirizine (ZYRTEC) 10 MG tablet Take 10 mg by mouth daily.  . clopidogrel (PLAVIX) 75 MG tablet Take 1 tablet (75 mg total) by mouth daily.  . fluticasone (FLONASE) 50 MCG/ACT nasal spray Place 2 sprays into both nostrils daily.  Marland Kitchen glipiZIDE (GLUCOTROL XL) 2.5 MG 24 hr tablet Take 3 tablets by mouth every morning  . glucose blood test strip USE TO TEST BLOOD SUGAR TWICE DAILY. ACCU-CHEK AVIVA PLUS  . hydrochlorothiazide (HYDRODIURIL) 25 MG tablet Take 1 tablet (25 mg total) by mouth daily.  Marland Kitchen lisinopril (PRINIVIL,ZESTRIL) 40 MG tablet Take 1 tablet (40 mg total) by mouth daily.  . metFORMIN (GLUCOPHAGE) 1000 MG tablet Take 1 tablet (1,000 mg total) by mouth 2 (two) times daily with a meal.  . Multiple Vitamin (MULTIVITAMIN) tablet Take 1 tablet by mouth daily.  .  Omega-3 Fatty Acids (FISH OIL) 1200 MG CAPS Take by mouth 4 (four) times daily.  . pantoprazole (PROTONIX) 40 MG tablet Take 1 tablet (40 mg total) by mouth daily.  . pioglitazone (ACTOS) 30 MG tablet Take 1 tablet (30 mg total) by mouth daily.  . sildenafil (REVATIO) 20 MG tablet Take 20 mg by mouth as needed.  . tamsulosin (FLOMAX) 0.4 MG CAPS capsule Take 0.4 mg by mouth daily with breakfast.   . testosterone cypionate (DEPOTESTOTERONE CYPIONATE) 200 MG/ML injection Inject 200 mg into the muscle every 14 (fourteen) days.  Marland Kitchen tiZANidine (ZANAFLEX) 2 MG tablet Take 2 mg by mouth 3 (three) times daily as needed.  . traMADol (ULTRAM) 50 MG tablet Take 1 tablet (50 mg total) by mouth 2 (two) times daily as needed.  . traZODone (DESYREL) 50 MG tablet Take 1 1/2 tablet q hs  . [DISCONTINUED] ALPRAZolam (XANAX) 0.25 MG tablet Take 1 tablet (0.25 mg total) by mouth daily as needed.  . [DISCONTINUED] amLODipine (NORVASC) 10 MG tablet Take 1 tablet (10 mg total) by mouth daily.  . [DISCONTINUED] traMADol (ULTRAM) 50 MG tablet Take 1 tablet (50 mg total) by mouth 2 (two) times daily as needed.  . [DISCONTINUED] traZODone (DESYREL) 50 MG tablet Take 1 1/2 tablet q hs  . doxycycline (VIBRA-TABS) 100 MG tablet Take 1 tablet (100 mg total) by mouth 2 (two) times daily.  . [DISCONTINUED] azithromycin (ZITHROMAX) 250 MG tablet Take two tablets x 1 day and then one tablet per day for four more days.  . [DISCONTINUED] magnesium oxide (MAG-OX) 400 MG tablet Take 1 tablet (400 mg total) by mouth daily. (Patient not taking: Reported on 12/22/2014)   Facility-Administered Encounter Medications as of 04/04/2015  Medication  . methylPREDNISolone acetate (DEPO-MEDROL) injection 40 mg    Review of Systems  Constitutional: Negative for appetite change and unexpected weight change.  HENT: Positive for congestion, postnasal drip and sinus pressure.   Eyes: Negative for pain and discharge.  Respiratory: Negative for  cough, chest tightness and shortness of breath.   Cardiovascular: Negative for chest pain, palpitations and leg swelling.  Gastrointestinal: Negative for nausea, vomiting, abdominal pain and diarrhea.       Taking stool softeners to help keep bowels regular.    Genitourinary: Negative for dysuria and difficulty urinating.  Musculoskeletal: Negative for back pain and joint swelling.  Skin: Negative for color change and rash.       Persistent skin lesion -  right ankle.   Neurological: Negative for dizziness and light-headedness.  Psychiatric/Behavioral: Negative for dysphoric mood and agitation.       Objective:    Physical Exam  Constitutional: He appears well-developed and well-nourished. No distress.  HENT:  Tenderness to palpation over the sinuses.  Nares with slightly erythematous turbinates.  OP - without lesions.   Eyes: Conjunctivae are normal. Right eye exhibits no discharge. Left eye exhibits no discharge.  Neck: Neck supple.  Cardiovascular: Normal rate and regular rhythm.   Pulmonary/Chest: Effort normal and breath sounds normal. No respiratory distress.  Abdominal: Soft. Bowel sounds are normal. There is no tenderness.  Musculoskeletal: He exhibits no edema or tenderness.  Lymphadenopathy:    He has no cervical adenopathy.  Skin: No rash noted. No erythema.  Approximate .5 x.5 cm ankle lesion - scab.  No surrounding erythema.  No increased warmth.    Psychiatric: He has a normal mood and affect. His behavior is normal.    BP 120/60 mmHg  Pulse 64  Temp(Src) 98.2 F (36.8 C) (Oral)  Resp 18  Ht 5\' 8"  (1.727 m)  Wt 220 lb (99.791 kg)  BMI 33.46 kg/m2  SpO2 95% Wt Readings from Last 3 Encounters:  04/04/15 220 lb (99.791 kg)  12/22/14 228 lb 6 oz (103.59 kg)  09/15/14 225 lb 4 oz (102.173 kg)     Lab Results  Component Value Date   WBC 7.9 12/07/2014   HGB 13.9 12/07/2014   HCT 41.6 12/07/2014   PLT 297.0 12/07/2014   GLUCOSE 111* 12/07/2014   CHOL 134  12/07/2014   TRIG 239.0* 12/07/2014   HDL 33.00* 12/07/2014   LDLDIRECT 76.0 12/07/2014   LDLCALC 58 11/21/2013   ALT 22 12/07/2014   AST 21 12/07/2014   NA 139 12/07/2014   K 4.4 12/07/2014   CL 101 12/07/2014   CREATININE 0.96 12/07/2014   BUN 19 12/07/2014   CO2 30 12/07/2014   TSH 1.08 03/27/2014   PSA 0.4 08/11/2014   HGBA1C 6.9* 12/07/2014   MICROALBUR 1.6 03/10/2013    Ct Abdomen Pelvis W Contrast  01/05/2015  CLINICAL DATA:  Persistent abdominal pain and bloating. EXAM: CT ABDOMEN AND PELVIS WITH CONTRAST TECHNIQUE: Multidetector CT imaging of the abdomen and pelvis was performed using the standard protocol following bolus administration of intravenous contrast. CONTRAST:  143mL OMNIPAQUE IOHEXOL 350 MG/ML SOLN COMPARISON:  Abdominal MRI 05/21/2010 FINDINGS: BODY WALL: No contributory findings. LOWER CHEST: Borderline cardiomegaly. Coronary atherosclerotic calcification noted. Calcified granuloma in the right lower lobe. ABDOMEN/PELVIS: Liver: No focal abnormality. Biliary: Cholelithiasis without inflammation or obstruction changes. Pancreas: Unremarkable. Spleen: Unremarkable. 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 Kidneys and ureters: No hydronephrosis or stone. Presumed 1 cm cyst in the lower pole left kidney. Bladder: Unremarkable. Reproductive: Symmetric enlargement of the prostate, projecting into the bladder. Bowel: Moderate colonic stool distally without obstruction. Distal colonic diverticulosis without active inflammation. Uncomplicated mid duodenal diverticulum. No appendicitis. Retroperitoneum: No mass or adenopathy. Peritoneum: No ascites or pneumoperitoneum. Vascular: No acute abnormality. OSSEOUS: L4-5 discectomy with posterior rod and pedicle screw fixation. No acute finding. Remote T12 compression fracture. There is diffuse ankylosis of the lower thoracic spine. IMPRESSION: 1. No specific explanation for patient's  symptoms. 2. Colonic diverticulosis. 3. Cholelithiasis. Electronically Signed   By: Monte Fantasia M.D.   On: 01/05/2015 10:13       Assessment & Plan:   Problem List Items Addressed This Visit  Abdominal distention    Abdominal discomfort and bloating resolved.  Saw GI.  Had CT as outlined. Stool softeners as directed.  Doing well.  Follow.       Anemia, iron deficiency    Follow cbc.       Diabetes mellitus ([place]) - Primary    Medication change as outlined.  Sugars reviewed and attached.  Appear to be improving recently.  Continue low carb diet and exercise.  Follow sugars.  Hold on making any changes in his medication.  Follow.        Relevant Orders   Hemoglobin A1c   Difficulty sleeping    Trazodone working well.  Continue.       GERD (gastroesophageal reflux disease)    On protonix.  No upper symptoms.        Hypercholesterolemia    On lipitor.  Low cholesterol idet and exercise.  Follow lipid panel and liver function tests.        Relevant Medications   amLODipine (NORVASC) 10 MG tablet   Other Relevant Orders   Lipid panel   Hepatic function panel   Hypertension    Blood pressure under good control.  Continue same medication regimen.  Follow pressures.  Follow metabolic panel.        Relevant Medications   amLODipine (NORVASC) 10 MG tablet   Other Relevant Orders   Basic metabolic panel   Intrathoracic goiter   Relevant Orders   TSH   Lesion of soft tissue of lower leg and ankle    Persistent, slow healing.  Stop the hydrogen peroxide.  Follow.  Notify me if persistent.       Sinusitis    Symptoms and exam as outlined.  Treat with zpak as directed.  mucinex and robitussin as directed.  Saline nasal spray and steroid nasal spray as directed.  Notify me if persistent problems.        Relevant Medications   doxycycline (VIBRA-TABS) 100 MG tablet    Other Visit Diagnoses    Routine general medical examination at a health care facility         Relevant Orders    Hepatitis C antibody        Einar Pheasant, MD

## 2015-04-04 NOTE — Progress Notes (Signed)
Pre-visit discussion using our clinic review tool. No additional management support is needed unless otherwise documented below in the visit note.  

## 2015-04-07 ENCOUNTER — Encounter: Payer: Self-pay | Admitting: Internal Medicine

## 2015-04-07 DIAGNOSIS — M7989 Other specified soft tissue disorders: Secondary | ICD-10-CM | POA: Insufficient documentation

## 2015-04-07 DIAGNOSIS — M799 Soft tissue disorder, unspecified: Secondary | ICD-10-CM | POA: Insufficient documentation

## 2015-04-07 NOTE — Assessment & Plan Note (Signed)
On protonix.  No upper symptoms.   

## 2015-04-07 NOTE — Assessment & Plan Note (Signed)
Medication change as outlined.  Sugars reviewed and attached.  Appear to be improving recently.  Continue low carb diet and exercise.  Follow sugars.  Hold on making any changes in his medication.  Follow.

## 2015-04-07 NOTE — Assessment & Plan Note (Signed)
Blood pressure under good control.  Continue same medication regimen.  Follow pressures.  Follow metabolic panel.   

## 2015-04-07 NOTE — Assessment & Plan Note (Signed)
On lipitor.  Low cholesterol idet and exercise.  Follow lipid panel and liver function tests.

## 2015-04-07 NOTE — Assessment & Plan Note (Signed)
Abdominal discomfort and bloating resolved.  Saw GI.  Had CT as outlined. Stool softeners as directed.  Doing well.  Follow.

## 2015-04-07 NOTE — Assessment & Plan Note (Signed)
Persistent, slow healing.  Stop the hydrogen peroxide.  Follow.  Notify me if persistent.

## 2015-04-07 NOTE — Assessment & Plan Note (Signed)
Trazodone working well.  Continue.

## 2015-04-07 NOTE — Assessment & Plan Note (Signed)
Symptoms and exam as outlined.  Treat with zpak as directed.  mucinex and robitussin as directed.  Saline nasal spray and steroid nasal spray as directed.  Notify me if persistent problems.

## 2015-04-07 NOTE — Assessment & Plan Note (Signed)
Follow cbc.  

## 2015-04-08 ENCOUNTER — Encounter: Payer: Self-pay | Admitting: Internal Medicine

## 2015-04-09 ENCOUNTER — Other Ambulatory Visit: Payer: Self-pay

## 2015-04-09 MED ORDER — PANTOPRAZOLE SODIUM 40 MG PO TBEC: 40.0000 mg | DELAYED_RELEASE_TABLET | Freq: Every day | ORAL | Status: DC

## 2015-04-10 ENCOUNTER — Encounter: Payer: Self-pay | Admitting: Internal Medicine

## 2015-04-11 MED ORDER — GLIPIZIDE ER 2.5 MG PO TB24: ORAL_TABLET | ORAL | Status: DC

## 2015-04-11 NOTE — Telephone Encounter (Signed)
rx sent in for glipizide XL.  See my chart message.

## 2015-04-24 ENCOUNTER — Other Ambulatory Visit: Payer: Self-pay

## 2015-04-24 MED ORDER — ATORVASTATIN CALCIUM 10 MG PO TABS: 10.0000 mg | ORAL_TABLET | Freq: Every day | ORAL | Status: DC

## 2015-04-27 ENCOUNTER — Other Ambulatory Visit (INDEPENDENT_AMBULATORY_CARE_PROVIDER_SITE_OTHER): Payer: Medicare Other

## 2015-04-30 ENCOUNTER — Other Ambulatory Visit: Payer: Medicare Other

## 2015-04-30 DIAGNOSIS — E119 Type 2 diabetes mellitus without complications: Secondary | ICD-10-CM

## 2015-04-30 DIAGNOSIS — E7800 Pure hypercholesterolemia, unspecified: Secondary | ICD-10-CM

## 2015-04-30 DIAGNOSIS — I10 Essential (primary) hypertension: Secondary | ICD-10-CM

## 2015-04-30 DIAGNOSIS — E010 Iodine-deficiency related diffuse (endemic) goiter: Secondary | ICD-10-CM

## 2015-04-30 LAB — BASIC METABOLIC PANEL
BUN: 15 mg/dL (ref 6–23)
CHLORIDE: 98 meq/L (ref 96–112)
CO2: 30 meq/L (ref 19–32)
Calcium: 10.2 mg/dL (ref 8.4–10.5)
Creatinine, Ser: 0.91 mg/dL (ref 0.40–1.50)
GFR: 87.31 mL/min (ref >60.00)
GLUCOSE: 142 mg/dL — AB (ref 70–99)
POTASSIUM: 4.1 meq/L (ref 3.5–5.1)
Sodium: 139 mEq/L (ref 135–145)

## 2015-04-30 LAB — LIPID PANEL
CHOL/HDL RATIO: 4
CHOLESTEROL: 146 mg/dL (ref 0–200)
HDL: 33.8 mg/dL — ABNORMAL LOW (ref >39.00)
NonHDL: 112.25
Triglycerides: 283 mg/dL — ABNORMAL HIGH (ref 0.0–149.0)
VLDL: 56.6 mg/dL — ABNORMAL HIGH (ref 0.0–40.0)

## 2015-04-30 LAB — HEPATIC FUNCTION PANEL
ALK PHOS: 42 U/L (ref 39–117)
ALT: 40 U/L (ref 0–53)
AST: 30 U/L (ref 0–37)
Albumin: 4.2 g/dL (ref 3.5–5.2)
BILIRUBIN DIRECT: 0.2 mg/dL (ref 0.0–0.3)
BILIRUBIN TOTAL: 0.7 mg/dL (ref 0.2–1.2)
TOTAL PROTEIN: 7.1 g/dL (ref 6.0–8.3)

## 2015-04-30 LAB — TSH: TSH: 0.84 u[IU]/mL (ref 0.35–4.50)

## 2015-04-30 LAB — HEMOGLOBIN A1C: Hgb A1c MFr Bld: 6.7 % — ABNORMAL HIGH (ref 4.6–6.5)

## 2015-04-30 LAB — LDL CHOLESTEROL, DIRECT: Direct LDL: 85 mg/dL

## 2015-05-01 ENCOUNTER — Other Ambulatory Visit: Payer: Self-pay | Admitting: Internal Medicine

## 2015-05-01 ENCOUNTER — Encounter: Payer: Self-pay | Admitting: Internal Medicine

## 2015-05-01 ENCOUNTER — Telehealth: Payer: Self-pay | Admitting: Internal Medicine

## 2015-05-01 NOTE — Telephone Encounter (Signed)
Pt would like to know what his Hep C results are? Thank You!

## 2015-05-02 ENCOUNTER — Other Ambulatory Visit: Payer: Self-pay | Admitting: Internal Medicine

## 2015-05-02 NOTE — Telephone Encounter (Signed)
Spoke with the patient, explained that the Hep C screening has not resulted yet.  We will notify him when it has.  Verbalized understanding.

## 2015-05-03 NOTE — Telephone Encounter (Signed)
ok'd tramadol #60 with no refills.  Declined doxycycline.

## 2015-05-23 ENCOUNTER — Other Ambulatory Visit: Payer: Self-pay | Admitting: Internal Medicine

## 2015-06-08 ENCOUNTER — Encounter: Payer: Self-pay | Admitting: Internal Medicine

## 2015-06-11 NOTE — Telephone Encounter (Signed)
Please ask Hoyle Sauer for the results.   Thanks

## 2015-06-12 ENCOUNTER — Other Ambulatory Visit: Payer: Self-pay | Admitting: Internal Medicine

## 2015-06-12 NOTE — Telephone Encounter (Signed)
Ricardo Nash, are you able to check on this?

## 2015-07-09 ENCOUNTER — Other Ambulatory Visit: Payer: Self-pay | Admitting: Internal Medicine

## 2015-08-01 ENCOUNTER — Other Ambulatory Visit: Payer: Self-pay | Admitting: Internal Medicine

## 2015-08-01 NOTE — Telephone Encounter (Signed)
ok'd refill for trazodone #45 with one refill.   

## 2015-08-01 NOTE — Telephone Encounter (Signed)
Ok top refill trazodone?

## 2015-08-08 ENCOUNTER — Other Ambulatory Visit: Payer: Self-pay | Admitting: Internal Medicine

## 2015-08-08 NOTE — Telephone Encounter (Signed)
Next OV: 09/07/15 Last OV: 12/16

## 2015-08-09 NOTE — Telephone Encounter (Signed)
ok'd refill alprazolam #30 with 1 refill.

## 2015-08-10 NOTE — Telephone Encounter (Signed)
Rx faxed to [facility] 

## 2015-09-07 ENCOUNTER — Telehealth: Payer: Self-pay | Admitting: Internal Medicine

## 2015-09-07 ENCOUNTER — Encounter: Payer: Self-pay | Admitting: Internal Medicine

## 2015-09-07 ENCOUNTER — Ambulatory Visit (INDEPENDENT_AMBULATORY_CARE_PROVIDER_SITE_OTHER): Payer: Medicare Other | Admitting: Internal Medicine

## 2015-09-07 VITALS — BP 158/60 | HR 67 | Temp 98.1°F | Ht 67.5 in | Wt 225.8 lb

## 2015-09-07 DIAGNOSIS — K219 Gastro-esophageal reflux disease without esophagitis: Secondary | ICD-10-CM

## 2015-09-07 DIAGNOSIS — Z0000 Encounter for general adult medical examination without abnormal findings: Secondary | ICD-10-CM | POA: Diagnosis not present

## 2015-09-07 DIAGNOSIS — I10 Essential (primary) hypertension: Secondary | ICD-10-CM | POA: Diagnosis not present

## 2015-09-07 DIAGNOSIS — J329 Chronic sinusitis, unspecified: Secondary | ICD-10-CM

## 2015-09-07 DIAGNOSIS — T148XXA Other injury of unspecified body region, initial encounter: Secondary | ICD-10-CM

## 2015-09-07 DIAGNOSIS — M542 Cervicalgia: Secondary | ICD-10-CM | POA: Diagnosis not present

## 2015-09-07 DIAGNOSIS — T148 Other injury of unspecified body region: Secondary | ICD-10-CM

## 2015-09-07 DIAGNOSIS — E669 Obesity, unspecified: Secondary | ICD-10-CM

## 2015-09-07 DIAGNOSIS — E010 Iodine-deficiency related diffuse (endemic) goiter: Secondary | ICD-10-CM

## 2015-09-07 DIAGNOSIS — G4733 Obstructive sleep apnea (adult) (pediatric): Secondary | ICD-10-CM

## 2015-09-07 DIAGNOSIS — E7800 Pure hypercholesterolemia, unspecified: Secondary | ICD-10-CM

## 2015-09-07 DIAGNOSIS — E119 Type 2 diabetes mellitus without complications: Secondary | ICD-10-CM | POA: Diagnosis not present

## 2015-09-07 DIAGNOSIS — R5383 Other fatigue: Secondary | ICD-10-CM

## 2015-09-07 DIAGNOSIS — E049 Nontoxic goiter, unspecified: Secondary | ICD-10-CM

## 2015-09-07 MED ORDER — ALPRAZOLAM 0.25 MG PO TABS: ORAL_TABLET | ORAL | Status: DC

## 2015-09-07 MED ORDER — TRAMADOL HCL 50 MG PO TABS: 50.0000 mg | ORAL_TABLET | Freq: Two times a day (BID) | ORAL | Status: DC | PRN

## 2015-09-07 MED ORDER — METOPROLOL SUCCINATE ER 25 MG PO TB24: 25.0000 mg | ORAL_TABLET | Freq: Two times a day (BID) | ORAL | Status: DC

## 2015-09-07 NOTE — Progress Notes (Signed)
Pre visit review using our clinic review tool, if applicable. No additional management support is needed unless otherwise documented below in the visit note. 

## 2015-09-07 NOTE — Telephone Encounter (Signed)
Pt mentioned that Dr Nicki Reaper wanted him to have lab work before next appt. Need orders please and thank you! Call pt @  Home [phone] or wife cell [phone].

## 2015-09-07 NOTE — Progress Notes (Signed)
Patient ID: Ricardo Nash, male   DOB: 12-Jul-1945, 70 y.o.   MRN: [id number]   Subjective:    Patient ID: Ricardo Nash, male    DOB: 1945-10-30, 70 y.o.   MRN: [id number]  HPI  Patient here for a physical exam.  He is not feeling well.  Has multiple issues he wants to discuss. He saw Dr Tami Ribas in 05/2015 and diagnosed with sinus infection.  Treated with levaquin.  In 07/2015, had another sinus infection.  Was not evaluated.  Still with some increased nasal congestion.  Feel more allergy related.  Uses CPAP.  Has noticed increased blood pressure since January.  See his attached list.  No chest pain.  Breathing stable.  Still going to the gym and exercising.  Sugars under reasonable control as outlined.  Two weeks ago, noticed a knot on his right lower leg - medial aspect.  Felt warm.  Noticed discoloration to right ankle.  No pain - ankle.  No injury or trauma that he is aware of.  Know is smaller.  Hematoma resolving.  Also has noticed increased neck pain.  Limited rom.  Gets a therapeutic massage.  No pain down arms.  No numbness or tingling.  No abdominal pain or cramping.  Bowels stable.     Past Medical History  Diagnosis Date  . Hypertension   . Diabetes mellitus ([place])   . Hypercholesterolemia   . Environmental allergies   . TIA (transient ischemic attack)   . Depression   . GERD (gastroesophageal reflux disease)   . Osteoarthritis     cervical spine, lumbar spine  . Paroxysmal atrial fibrillation ([facility])   . C. difficile colitis   . Goiter     intrathoracic, s/p benign biopsy (Dr Harlow Asa)  . Diverticulosis    Past Surgical History  Procedure Laterality Date  . Inguinal hernia repair  1991    Dr, Tamala Julian  . Septoplasty  1975  . Shoulder surgery  2000    rotator cuff  . Cervical disc surgery  2002  . Back surgery  8/08    s/p fusion of L4-L5  . Nose surgery      turbinate reduction  . Cataract extraction  2011    Dr. Herbert Deaner  . Eyelid reduction      Dr. Carlis Abbott    Family History  Problem Relation Age of Onset  . Congestive Heart Failure Father   . Heart disease Father     myocardial infarction  . Rheumatic fever Father     valvular disease  . Heart disease Mother     s/p CABG (age 48)  . Colon cancer Neg Hx   . Prostate cancer Neg Hx    Social History   Social History  . Marital Status: Married    Spouse Name: N/A  . Number of Children: 2  . Years of Education: N/A   Occupational History  . retired Pharmacist, hospital    Social History Main Topics  . Smoking status: Former Research scientist (life sciences)  . Smokeless tobacco: Never Used  . Alcohol Use: 0.0 oz/week    0 Standard drinks or equivalent per week     Comment: occasional  . Drug Use: No  . Sexual Activity: Not Asked   Other Topics Concern  . None   Social History Narrative    Outpatient Encounter Prescriptions as of 09/07/2015  Medication Sig  . ACCU-CHEK AVIVA PLUS test strip USE TO TEST BLOOD SUGAR TWICE DAILY  . ALPRAZolam (XANAX) 0.25 MG tablet  TAKE 1 TABLET BY MOUTH ONCE DAILY AS NEEDED.  Marland Kitchen amLODipine (NORVASC) 10 MG tablet Take 1 tablet (10 mg total) by mouth daily.  Marland Kitchen aspirin 81 MG tablet Take 81 mg by mouth daily.  Marland Kitchen atorvastatin (LIPITOR) 10 MG tablet Take 1 tablet (10 mg total) by mouth daily.  . cetirizine (ZYRTEC) 10 MG tablet Take 10 mg by mouth daily.  . clopidogrel (PLAVIX) 75 MG tablet Take 1 tablet (75 mg total) by mouth daily.  . fluticasone (FLONASE) 50 MCG/ACT nasal spray USE 2 SPRAYS IN EACH NOSTRIL ONCE DAILY.  Marland Kitchen glipiZIDE (GLUCOTROL XL) 2.5 MG 24 hr tablet Take 3 tablets by mouth every morning  . hydrochlorothiazide (HYDRODIURIL) 25 MG tablet Take 1 tablet (25 mg total) by mouth daily.  Marland Kitchen lisinopril (PRINIVIL,ZESTRIL) 40 MG tablet TAKE 1 TABLET BY MOUTH ONCE DAILY.  . metFORMIN (GLUCOPHAGE) 1000 MG tablet TAKE 1 TABLET BY MOUTH TWICE DAILY WITH MEALS.  . metoprolol succinate (TOPROL-XL) 25 MG 24 hr tablet Take 1 tablet (25 mg total) by mouth 2 (two) times daily.  . Multiple  Vitamin (MULTIVITAMIN) tablet Take 1 tablet by mouth daily.  . Omega-3 Fatty Acids (FISH OIL) 1200 MG CAPS Take by mouth 4 (four) times daily.  . pantoprazole (PROTONIX) 40 MG tablet Take 1 tablet (40 mg total) by mouth daily.  . pioglitazone (ACTOS) 30 MG tablet Take 1 tablet (30 mg total) by mouth daily.  . sildenafil (REVATIO) 20 MG tablet Take 20 mg by mouth as needed.  . tamsulosin (FLOMAX) 0.4 MG CAPS capsule Take 0.4 mg by mouth daily with breakfast.   . testosterone cypionate (DEPOTESTOTERONE CYPIONATE) 200 MG/ML injection Inject 200 mg into the muscle every 14 (fourteen) days.  Marland Kitchen tiZANidine (ZANAFLEX) 2 MG tablet Take 2 mg by mouth 3 (three) times daily as needed.  . traMADol (ULTRAM) 50 MG tablet Take 1 tablet (50 mg total) by mouth 2 (two) times daily as needed.  . traZODone (DESYREL) 50 MG tablet TAKE 1 & 1/2 TABLETS BY MOUTH EACH NIGHTAT BEDTIME.  . [DISCONTINUED] ALPRAZolam (XANAX) 0.25 MG tablet TAKE 1 TABLET BY MOUTH ONCE DAILY AS NEEDED.  . [DISCONTINUED] metoprolol succinate (TOPROL-XL) 25 MG 24 hr tablet TAKE 1 TABLET BY MOUTH ONCE DAILY.  . [DISCONTINUED] traMADol (ULTRAM) 50 MG tablet TAKE 1 TABLET BY MOUTH TWICE DAILY AS NEEDED.  Marland Kitchen doxycycline (VIBRA-TABS) 100 MG tablet Take 1 tablet (100 mg total) by mouth 2 (two) times daily. (Patient not taking: Reported on 09/07/2015)   Facility-Administered Encounter Medications as of 09/07/2015  Medication  . methylPREDNISolone acetate (DEPO-MEDROL) injection 40 mg    Review of Systems  Constitutional: Positive for fatigue. Negative for appetite change and unexpected weight change.  HENT: Positive for congestion and postnasal drip.   Eyes: Negative for pain and visual disturbance.  Respiratory: Negative for cough, chest tightness and shortness of breath.   Cardiovascular: Negative for chest pain, palpitations and leg swelling.  Gastrointestinal: Negative for nausea, vomiting, abdominal pain and diarrhea.  Genitourinary: Negative  for dysuria and difficulty urinating.  Musculoskeletal: Negative for back pain and joint swelling.  Skin: Negative for color change and rash.  Neurological: Negative for dizziness, light-headedness and headaches.  Hematological: Negative for adenopathy. Does not bruise/bleed easily.  Psychiatric/Behavioral: Negative for agitation.       Has some increased anxiety - worse at times.  Some depression related to his current ongoing issues.         Objective:    Physical Exam  Constitutional: He is oriented to person, place, and time. He appears well-developed and well-nourished. No distress.  HENT:  Head: Normocephalic and atraumatic.  Mouth/Throat: Oropharynx is clear and moist. No oropharyngeal exudate.  Slightly erythematous turbinates.    Eyes: Conjunctivae are normal. Right eye exhibits no discharge. Left eye exhibits no discharge.  Neck: Neck supple. No thyromegaly present.  Cardiovascular: Normal rate and regular rhythm.   Pulmonary/Chest: Breath sounds normal. No respiratory distress. He has no wheezes.  Abdominal: Soft. Bowel sounds are normal. There is no tenderness.  Genitourinary:  Followed by urology.   Musculoskeletal: He exhibits no edema or tenderness.  Increased pain with looking to right and left - right > left.    Lymphadenopathy:    He has no cervical adenopathy.  Neurological: He is alert and oriented to person, place, and time.  Skin: Skin is warm and dry. No rash noted. No erythema.  Hematoma - right lower leg.  (per pt improved).  Minimal pain localized over the hematoma.  Reabsorption of blood that oozed to ankle.  No pain in ankle.    Psychiatric: He has a normal mood and affect. His behavior is normal.    BP 158/60 mmHg  Pulse 67  Temp(Src) 98.1 F (36.7 C) (Oral)  Ht 5' 7.5" (1.715 m)  Wt 225 lb 12.8 oz (102.422 kg)  BMI 34.82 kg/m2  SpO2 97% Wt Readings from Last 3 Encounters:  09/07/15 225 lb 12.8 oz (102.422 kg)  04/04/15 220 lb (99.791 kg)    12/22/14 228 lb 6 oz (103.59 kg)     Lab Results  Component Value Date   WBC 7.9 12/07/2014   HGB 13.9 12/07/2014   HCT 41.6 12/07/2014   PLT 297.0 12/07/2014   GLUCOSE 142* 04/27/2015   CHOL 146 04/27/2015   TRIG 283.0* 04/27/2015   HDL 33.80* 04/27/2015   LDLDIRECT 85.0 04/27/2015   LDLCALC 58 11/21/2013   ALT 40 04/27/2015   AST 30 04/27/2015   NA 139 04/27/2015   K 4.1 04/27/2015   CL 98 04/27/2015   CREATININE 0.91 04/27/2015   BUN 15 04/27/2015   CO2 30 04/27/2015   TSH 0.84 04/27/2015   PSA 0.4 08/11/2014   HGBA1C 6.7* 04/27/2015   MICROALBUR 1.6 03/10/2013        Assessment & Plan:   Problem List Items Addressed This Visit    Diabetes mellitus ([place])    Sugars as outlined.  Overall doing well.  Continue same medication regimen.  Follow met b and a1c.        Relevant Orders   Hemoglobin A1c   Microalbumin / creatinine urine ratio   Fatigue    Felt to be multifactorial.  Has been sick.  Treat sinus as outlined.  Check xray for neck.  Check routine labs.  Treat blood pressure.        GERD (gastroesophageal reflux disease)    No upper symptoms reported.  On protonix.        Health care maintenance - Primary    Physical today 09/07/15.  Prostate and psa checks followed by urology.  Colonoscopy 07/21/11.        Hematoma    Appears to have hematoma of right lower leg.  Better.  Follow.        Hypercholesterolemia    On lipitor.  Low cholesterol diet and exercise.  Follow lipid panel and liver function tests.       Relevant Medications   metoprolol succinate (TOPROL-XL) 25 MG  24 hr tablet   Other Relevant Orders   Lipid panel   Hepatic function panel   Hypertension    Blood pressure elevated as outlined.  Increase metoprolol to bid.  Follow pressures.  Get him back in soon to reassess.        Relevant Medications   metoprolol succinate (TOPROL-XL) 25 MG 24 hr tablet   Other Relevant Orders   CBC with Differential/Platelet   Basic metabolic  panel   Intrathoracic goiter    Followed by Dr Harlow Asa.  S/p biopsy.  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  Follow.        Relevant Medications   metoprolol succinate (TOPROL-XL) 25 MG 24 hr tablet   Neck pain    Limited rom as outlined.  Check c-spine xray.  Tramadol rx refilled.        Relevant Orders   DG Cervical Spine 2 or 3 views   Obesity (BMI 30-39.9)    Diet and exercise.        Obstructive sleep apnea    Continue cpap.       Sinusitis    Has issues with reoccurring sinusitis as outlined.  Sees ENT.  Do not feel abx warranted at this time.  Saline nasal spray, steroid nasal spray and mucinex as directed.  Discussed referral for allergy evaluation.  Will notify me when agreeable.          I spent 40 minutes with the patient and more than 50% of the time was spent in consultation regarding the above.     Einar Pheasant, MD

## 2015-09-07 NOTE — Assessment & Plan Note (Signed)
Physical today 09/07/15.  Prostate and psa checks followed by urology.  Colonoscopy 07/21/11.

## 2015-09-07 NOTE — Telephone Encounter (Signed)
Left a message to call the office and schedule lab appointment for 1-2 weeks prior to follow up.

## 2015-09-09 ENCOUNTER — Encounter: Payer: Self-pay | Admitting: Internal Medicine

## 2015-09-09 DIAGNOSIS — T148XXA Other injury of unspecified body region, initial encounter: Secondary | ICD-10-CM | POA: Insufficient documentation

## 2015-09-09 DIAGNOSIS — R5383 Other fatigue: Secondary | ICD-10-CM | POA: Insufficient documentation

## 2015-09-09 NOTE — Assessment & Plan Note (Signed)
Diet and exercise.   

## 2015-09-09 NOTE — Telephone Encounter (Signed)
Order placed for labs.  Please schedule fasting lab appt in 1-2 weeks.

## 2015-09-09 NOTE — Assessment & Plan Note (Signed)
Appears to have hematoma of right lower leg.  Better.  Follow.

## 2015-09-09 NOTE — Assessment & Plan Note (Signed)
On lipitor.  Low cholesterol diet and exercise.  Follow lipid panel and liver function tests.   

## 2015-09-09 NOTE — Assessment & Plan Note (Signed)
Followed by Dr Harlow Asa.  S/p biopsy.  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  Follow.

## 2015-09-09 NOTE — Assessment & Plan Note (Signed)
No upper symptoms reported.  On protonix.   

## 2015-09-09 NOTE — Assessment & Plan Note (Signed)
Blood pressure elevated as outlined.  Increase metoprolol to bid.  Follow pressures.  Get him back in soon to reassess.

## 2015-09-09 NOTE — Assessment & Plan Note (Signed)
Sugars as outlined.  Overall doing well.  Continue same medication regimen.  Follow met b and a1c.

## 2015-09-09 NOTE — Assessment & Plan Note (Signed)
Felt to be multifactorial.  Has been sick.  Treat sinus as outlined.  Check xray for neck.  Check routine labs.  Treat blood pressure.

## 2015-09-09 NOTE — Assessment & Plan Note (Addendum)
Limited rom as outlined.  Check c-spine xray.  Tramadol rx refilled.

## 2015-09-09 NOTE — Assessment & Plan Note (Signed)
Has issues with reoccurring sinusitis as outlined.  Sees ENT.  Do not feel abx warranted at this time.  Saline nasal spray, steroid nasal spray and mucinex as directed.  Discussed referral for allergy evaluation.  Will notify me when agreeable.

## 2015-09-09 NOTE — Assessment & Plan Note (Signed)
Continue cpap.  

## 2015-09-10 ENCOUNTER — Ambulatory Visit
Admission: RE | Admit: 2015-09-10 | Discharge: 2015-09-10 | Disposition: A | Discharge status: Home / Self Care | Payer: Medicare Other | Source: Ambulatory Visit | Attending: Internal Medicine | Admitting: Internal Medicine

## 2015-09-10 ENCOUNTER — Other Ambulatory Visit: Payer: Self-pay | Admitting: Internal Medicine

## 2015-09-10 DIAGNOSIS — Z1159 Encounter for screening for other viral diseases: Secondary | ICD-10-CM

## 2015-09-10 DIAGNOSIS — M542 Cervicalgia: Secondary | ICD-10-CM | POA: Insufficient documentation

## 2015-09-10 NOTE — Progress Notes (Signed)
Order placed for hepatitis c ab test.

## 2015-09-10 NOTE — Telephone Encounter (Signed)
Lm for pt to call and schedule a fasting lab in 1-2wks

## 2015-09-11 ENCOUNTER — Encounter: Payer: Self-pay | Admitting: Internal Medicine

## 2015-09-12 ENCOUNTER — Encounter: Payer: Self-pay | Admitting: *Deleted

## 2015-09-12 ENCOUNTER — Other Ambulatory Visit: Payer: Self-pay | Admitting: Internal Medicine

## 2015-09-12 DIAGNOSIS — M542 Cervicalgia: Secondary | ICD-10-CM

## 2015-09-12 NOTE — Progress Notes (Signed)
Order placed for referral to Dr Chasnis.   

## 2015-09-24 ENCOUNTER — Other Ambulatory Visit: Payer: Self-pay | Admitting: Internal Medicine

## 2015-09-26 ENCOUNTER — Other Ambulatory Visit: Payer: Self-pay | Admitting: Internal Medicine

## 2015-09-26 NOTE — Telephone Encounter (Signed)
Refilled 08/01/15 with one refill. Last seen 09/07/15. Please advise?

## 2015-09-26 NOTE — Telephone Encounter (Signed)
ok'd refill trazodone #45 with 2 refills.

## 2015-10-02 ENCOUNTER — Other Ambulatory Visit (INDEPENDENT_AMBULATORY_CARE_PROVIDER_SITE_OTHER): Payer: Medicare Other

## 2015-10-02 DIAGNOSIS — E119 Type 2 diabetes mellitus without complications: Secondary | ICD-10-CM | POA: Diagnosis not present

## 2015-10-02 DIAGNOSIS — I10 Essential (primary) hypertension: Secondary | ICD-10-CM

## 2015-10-02 DIAGNOSIS — Z1159 Encounter for screening for other viral diseases: Secondary | ICD-10-CM

## 2015-10-02 DIAGNOSIS — E7800 Pure hypercholesterolemia, unspecified: Secondary | ICD-10-CM

## 2015-10-02 LAB — CBC WITH DIFFERENTIAL/PLATELET
BASOS ABS: 0 10*3/uL (ref 0.0–0.1)
Basophils Relative: 0.4 % (ref 0.0–3.0)
Eosinophils Absolute: 0.1 10*3/uL (ref 0.0–0.7)
Eosinophils Relative: 1 % (ref 0.0–5.0)
HEMATOCRIT: 43.3 % (ref 39.0–52.0)
Hemoglobin: 14.3 g/dL (ref 13.0–17.0)
LYMPHS ABS: 1.6 10*3/uL (ref 0.7–4.0)
LYMPHS PCT: 18.6 % (ref 12.0–46.0)
MCHC: 32.9 g/dL (ref 30.0–36.0)
MCV: 87.4 fl (ref 78.0–100.0)
MONOS PCT: 5.1 % (ref 3.0–12.0)
Monocytes Absolute: 0.4 10*3/uL (ref 0.1–1.0)
NEUTROS PCT: 74.9 % (ref 43.0–77.0)
Neutro Abs: 6.4 10*3/uL (ref 1.4–7.7)
Platelets: 285 10*3/uL (ref 150.0–400.0)
RBC: 4.96 Mil/uL (ref 4.22–5.81)
RDW: 14.4 % (ref 11.5–15.5)
WBC: 8.6 10*3/uL (ref 4.0–10.5)

## 2015-10-02 LAB — HEPATIC FUNCTION PANEL
ALBUMIN: 4.1 g/dL (ref 3.5–5.2)
ALT: 22 U/L (ref 0–53)
AST: 17 U/L (ref 0–37)
Alkaline Phosphatase: 35 U/L — ABNORMAL LOW (ref 39–117)
Bilirubin, Direct: 0.2 mg/dL (ref 0.0–0.3)
TOTAL PROTEIN: 6.7 g/dL (ref 6.0–8.3)
Total Bilirubin: 0.9 mg/dL (ref 0.2–1.2)

## 2015-10-02 LAB — BASIC METABOLIC PANEL
BUN: 15 mg/dL (ref 6–23)
CHLORIDE: 101 meq/L (ref 96–112)
CO2: 29 mEq/L (ref 19–32)
Calcium: 9.8 mg/dL (ref 8.4–10.5)
Creatinine, Ser: 0.82 mg/dL (ref 0.40–1.50)
GFR: 98.34 mL/min (ref >60.00)
Glucose, Bld: 157 mg/dL — ABNORMAL HIGH (ref 70–99)
POTASSIUM: 3.8 meq/L (ref 3.5–5.1)
Sodium: 139 mEq/L (ref 135–145)

## 2015-10-02 LAB — LIPID PANEL
CHOL/HDL RATIO: 5
Cholesterol: 135 mg/dL (ref 0–200)
HDL: 27.7 mg/dL — ABNORMAL LOW (ref >39.00)
NONHDL: 107.43
TRIGLYCERIDES: 244 mg/dL — AB (ref 0.0–149.0)
VLDL: 48.8 mg/dL — ABNORMAL HIGH (ref 0.0–40.0)

## 2015-10-02 LAB — MICROALBUMIN / CREATININE URINE RATIO
Creatinine,U: 151.5 mg/dL
MICROALB UR: 9.1 mg/dL — AB (ref 0.0–1.9)
Microalb Creat Ratio: 6 mg/g (ref 0.0–30.0)

## 2015-10-02 LAB — LDL CHOLESTEROL, DIRECT: LDL DIRECT: 80 mg/dL

## 2015-10-02 LAB — HEMOGLOBIN A1C: Hgb A1c MFr Bld: 6.8 % — ABNORMAL HIGH (ref 4.6–6.5)

## 2015-10-03 ENCOUNTER — Encounter: Payer: Self-pay | Admitting: Internal Medicine

## 2015-10-03 LAB — HEPATITIS C ANTIBODY: HCV Ab: NEGATIVE

## 2015-10-04 ENCOUNTER — Other Ambulatory Visit: Payer: Self-pay | Admitting: Internal Medicine

## 2015-10-11 NOTE — Telephone Encounter (Signed)
Unread mychart message mailed to patient 

## 2015-10-26 ENCOUNTER — Other Ambulatory Visit: Payer: Self-pay | Admitting: Internal Medicine

## 2015-11-05 ENCOUNTER — Other Ambulatory Visit: Payer: Self-pay | Admitting: Internal Medicine

## 2015-11-19 ENCOUNTER — Other Ambulatory Visit: Payer: Self-pay

## 2015-11-19 MED ORDER — ALPRAZOLAM 0.25 MG PO TABS: ORAL_TABLET | ORAL | Status: DC

## 2015-11-19 NOTE — Telephone Encounter (Signed)
Please advise refill, last one was in April, thanks

## 2015-11-19 NOTE — Telephone Encounter (Signed)
ok'd refill for xanax #30 with one refill.   

## 2015-11-23 ENCOUNTER — Encounter: Payer: Self-pay | Admitting: Internal Medicine

## 2015-11-23 ENCOUNTER — Ambulatory Visit (INDEPENDENT_AMBULATORY_CARE_PROVIDER_SITE_OTHER): Payer: Medicare Other | Admitting: Internal Medicine

## 2015-11-23 VITALS — BP 122/62 | HR 73 | Temp 98.0°F | Resp 18 | Ht 67.5 in | Wt 219.1 lb

## 2015-11-23 DIAGNOSIS — K219 Gastro-esophageal reflux disease without esophagitis: Secondary | ICD-10-CM

## 2015-11-23 DIAGNOSIS — E7800 Pure hypercholesterolemia, unspecified: Secondary | ICD-10-CM

## 2015-11-23 DIAGNOSIS — Z658 Other specified problems related to psychosocial circumstances: Secondary | ICD-10-CM

## 2015-11-23 DIAGNOSIS — F439 Reaction to severe stress, unspecified: Secondary | ICD-10-CM

## 2015-11-23 DIAGNOSIS — M542 Cervicalgia: Secondary | ICD-10-CM | POA: Diagnosis not present

## 2015-11-23 DIAGNOSIS — E119 Type 2 diabetes mellitus without complications: Secondary | ICD-10-CM

## 2015-11-23 DIAGNOSIS — I10 Essential (primary) hypertension: Secondary | ICD-10-CM

## 2015-11-23 DIAGNOSIS — E049 Nontoxic goiter, unspecified: Secondary | ICD-10-CM

## 2015-11-23 DIAGNOSIS — J329 Chronic sinusitis, unspecified: Secondary | ICD-10-CM

## 2015-11-23 DIAGNOSIS — E669 Obesity, unspecified: Secondary | ICD-10-CM | POA: Diagnosis not present

## 2015-11-23 DIAGNOSIS — E010 Iodine-deficiency related diffuse (endemic) goiter: Secondary | ICD-10-CM

## 2015-11-23 DIAGNOSIS — G4733 Obstructive sleep apnea (adult) (pediatric): Secondary | ICD-10-CM

## 2015-11-23 MED ORDER — ALPRAZOLAM 0.25 MG PO TABS: ORAL_TABLET | ORAL | Status: DC

## 2015-11-23 NOTE — Progress Notes (Signed)
Pre-visit discussion using our clinic review tool. No additional management support is needed unless otherwise documented below in the visit note.  

## 2015-11-23 NOTE — Progress Notes (Signed)
Patient ID: Ricardo Nash, male   DOB: June 02, 1945, 70 y.o.   MRN: [id number]   Subjective:    Patient ID: Ricardo Nash, male    DOB: 1946-04-13, 70 y.o.   MRN: [id number]  HPI  Patient here for a scheduled follow up.  He has been trying to deal with the increased stress of his sister's death.  Having a hard time with this.  Discussed with him today.  Discussed counseling.  He is agreeable.  He is still having some sinus issues.  Plans to f/u with ENT.  Had moles checked.  Saw dermatology.  Margins clear.  No chest pain.  No sob.  No abdominal pain or cramping.  Bowels stable.  Saw Dr Sharlet Salina.  Going to physical therapy.  Planning to f/u on 12/13/15.     Past Medical History  Diagnosis Date  . Hypertension   . Diabetes mellitus ([place])   . Hypercholesterolemia   . Environmental allergies   . TIA (transient ischemic attack)   . Depression   . GERD (gastroesophageal reflux disease)   . Osteoarthritis     cervical spine, lumbar spine  . Paroxysmal atrial fibrillation ([facility])   . C. difficile colitis   . Goiter     intrathoracic, s/p benign biopsy (Dr Harlow Asa)  . Diverticulosis    Past Surgical History  Procedure Laterality Date  . Inguinal hernia repair  1991    Dr, Tamala Julian  . Septoplasty  1975  . Shoulder surgery  2000    rotator cuff  . Cervical disc surgery  2002  . Back surgery  8/08    s/p fusion of L4-L5  . Nose surgery      turbinate reduction  . Cataract extraction  2011    Dr. Herbert Deaner  . Eyelid reduction      Dr. Carlis Abbott   Family History  Problem Relation Age of Onset  . Congestive Heart Failure Father   . Heart disease Father     myocardial infarction  . Rheumatic fever Father     valvular disease  . Heart disease Mother     s/p CABG (age 51)  . Colon cancer Neg Hx   . Prostate cancer Neg Hx    Social History   Social History  . Marital Status: Married    Spouse Name: N/A  . Number of Children: 2  . Years of Education: N/A   Occupational History  .  retired Pharmacist, hospital    Social History Main Topics  . Smoking status: Former Research scientist (life sciences)  . Smokeless tobacco: Never Used  . Alcohol Use: 0.0 oz/week    0 Standard drinks or equivalent per week     Comment: occasional  . Drug Use: No  . Sexual Activity: Not Asked   Other Topics Concern  . None   Social History Narrative    Outpatient Encounter Prescriptions as of 11/23/2015  Medication Sig  . ACCU-CHEK AVIVA PLUS test strip USE TO TEST BLOOD SUGAR TWICE DAILY  . ALPRAZolam (XANAX) 0.25 MG tablet TAKE 1 TABLET BY MOUTH ONCE DAILY AS NEEDED.  Marland Kitchen amLODipine (NORVASC) 10 MG tablet Take 1 tablet (10 mg total) by mouth daily.  Marland Kitchen aspirin 81 MG tablet Take 81 mg by mouth daily.  Marland Kitchen atorvastatin (LIPITOR) 10 MG tablet Take 1 tablet (10 mg total) by mouth daily.  . cetirizine (ZYRTEC) 10 MG tablet Take 10 mg by mouth daily.  . clopidogrel (PLAVIX) 75 MG tablet TAKE ONE TABLET BY MOUTH ONCE DAILY.  Marland Kitchen  fluticasone (FLONASE) 50 MCG/ACT nasal spray USE 2 SPRAYS IN EACH NOSTRIL ONCE DAILY.  Marland Kitchen glipiZIDE (GLUCOTROL XL) 2.5 MG 24 hr tablet Take 3 tablets by mouth every morning  . hydrochlorothiazide (HYDRODIURIL) 25 MG tablet TAKE 1 TABLET BY MOUTH ONCE DAILY.  Marland Kitchen lisinopril (PRINIVIL,ZESTRIL) 40 MG tablet TAKE 1 TABLET BY MOUTH ONCE DAILY.  . metFORMIN (GLUCOPHAGE) 1000 MG tablet TAKE 1 TABLET BY MOUTH TWICE DAILY WITH MEALS.  . metoprolol succinate (TOPROL-XL) 25 MG 24 hr tablet Take 1 tablet (25 mg total) by mouth 2 (two) times daily.  . Multiple Vitamin (MULTIVITAMIN) tablet Take 1 tablet by mouth daily.  . Omega-3 Fatty Acids (FISH OIL) 1200 MG CAPS Take by mouth 4 (four) times daily.  . pantoprazole (PROTONIX) 40 MG tablet Take 1 tablet (40 mg total) by mouth daily.  . pioglitazone (ACTOS) 30 MG tablet TAKE 1 TABLET BY MOUTH ONCE DAILY.  . sildenafil (REVATIO) 20 MG tablet Take 20 mg by mouth as needed.  . tamsulosin (FLOMAX) 0.4 MG CAPS capsule Take 0.4 mg by mouth daily with breakfast.   . testosterone  cypionate (DEPOTESTOTERONE CYPIONATE) 200 MG/ML injection Inject 200 mg into the muscle every 14 (fourteen) days.  Marland Kitchen tiZANidine (ZANAFLEX) 2 MG tablet Take 2 mg by mouth 3 (three) times daily as needed.  . traMADol (ULTRAM) 50 MG tablet Take 1 tablet (50 mg total) by mouth 2 (two) times daily as needed.  . traZODone (DESYREL) 50 MG tablet TAKE 1 & 1/2 TABLETS BY MOUTH EACH NIGHTAT BEDTIME.  . [DISCONTINUED] ALPRAZolam (XANAX) 0.25 MG tablet TAKE 1 TABLET BY MOUTH ONCE DAILY AS NEEDED.  . [DISCONTINUED] doxycycline (VIBRA-TABS) 100 MG tablet Take 1 tablet (100 mg total) by mouth 2 (two) times daily.   Facility-Administered Encounter Medications as of 11/23/2015  Medication  . methylPREDNISolone acetate (DEPO-MEDROL) injection 40 mg    Review of Systems  Constitutional: Negative for fever and appetite change.  HENT: Positive for congestion and sinus pressure.   Respiratory: Negative for cough, chest tightness and shortness of breath.   Cardiovascular: Negative for chest pain, palpitations and leg swelling.  Gastrointestinal: Negative for nausea, vomiting, abdominal pain and diarrhea.  Genitourinary: Negative for dysuria and difficulty urinating.  Musculoskeletal: Negative for myalgias and joint swelling.       Neck pain.  Going to therapy.    Skin: Negative for color change and rash.  Neurological: Negative for dizziness, light-headedness and headaches.  Psychiatric/Behavioral: Negative for dysphoric mood and agitation.       Objective:     Blood pressure rechecked by me:  142/78  Physical Exam  Constitutional: He appears well-developed and well-nourished. No distress.  HENT:  Nose: Nose normal.  Mouth/Throat: Oropharynx is clear and moist.  Neck: Neck supple. No thyromegaly present.  Cardiovascular: Normal rate and regular rhythm.   Pulmonary/Chest: Effort normal and breath sounds normal. No respiratory distress.  Abdominal: Soft. Bowel sounds are normal. There is no tenderness.    Musculoskeletal: He exhibits no edema or tenderness.  Lymphadenopathy:    He has no cervical adenopathy.  Skin: No rash noted. No erythema.  Psychiatric: He has a normal mood and affect. His behavior is normal.    BP 122/62 mmHg  Pulse 73  Temp(Src) 98 F (36.7 C) (Oral)  Resp 18  Ht 5' 7.5" (1.715 m)  Wt 219 lb 2 oz (99.394 kg)  BMI 33.79 kg/m2  SpO2 95% Wt Readings from Last 3 Encounters:  11/23/15 219 lb 2 oz (99.394  kg)  09/07/15 225 lb 12.8 oz (102.422 kg)  04/04/15 220 lb (99.791 kg)     Lab Results  Component Value Date   WBC 8.6 10/02/2015   HGB 14.3 10/02/2015   HCT 43.3 10/02/2015   PLT 285.0 10/02/2015   GLUCOSE 157* 10/02/2015   CHOL 135 10/02/2015   TRIG 244.0* 10/02/2015   HDL 27.70* 10/02/2015   LDLDIRECT 80.0 10/02/2015   LDLCALC 58 11/21/2013   ALT 22 10/02/2015   AST 17 10/02/2015   NA 139 10/02/2015   K 3.8 10/02/2015   CL 101 10/02/2015   CREATININE 0.82 10/02/2015   BUN 15 10/02/2015   CO2 29 10/02/2015   TSH 0.84 04/27/2015   PSA 0.4 08/11/2014   HGBA1C 6.8* 10/02/2015   MICROALBUR 9.1* 10/02/2015    Dg Cervical Spine 2 Or 3 Views  09/10/2015  CLINICAL DATA:  Bilateral posterior neck pain, loss of motion beginning 1 month ago. Remote history of cervical fusion. EXAM: CERVICAL SPINE - 2-3 VIEW COMPARISON:  None. FINDINGS: Changes of prior anterior fusion at C5-6. Degenerative spurring anteriorly at C4-5 and C6-7. Disc spaces are maintained. Prevertebral soft tissues are normal. Mild degenerative facet disease bilaterally. IMPRESSION: No acute bony abnormality. Electronically Signed   By: Rolm Baptise M.D.   On: 09/10/2015 12:06       Assessment & Plan:   Problem List Items Addressed This Visit    Diabetes mellitus ([place])    Sugars attached.  Doing better.  Follow met b and a1c.  Low carb diet and exercise.        Relevant Orders   Hemoglobin G2B   Basic metabolic panel   GERD (gastroesophageal reflux disease)    No upper symptoms.   Controlled on protonix.  Follow.       Hypercholesterolemia    Low cholesterol diet and exercise.  On lipitor.  Follow lipid panel and liver function tests.        Relevant Orders   Lipid panel   Hepatic function panel   Hypertension    Blood pressure under good control.  Continue same medication regimen.  Follow pressures.  Follow metabolic panel.        Relevant Orders   CBC with Differential/Platelet   Intrathoracic goiter    S/p biopsy.  Followed by Dr Harlow Asa.  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  Follow.       Neck pain - Primary    Saw Dr Sharlet Salina.  See his note for details.  Going to therapy.  Improved.  Due to f/u with Dr Sharlet Salina 12/13/15.        Obesity (BMI 30-39.9)    Diet and exercise.  Follow.        Obstructive sleep apnea    CPAP.       Sinusitis    Persistent sinus congestion.  Planning to f/u with ENT.       Stress    Increased stress with his sister's recent death.  Discussed with him today.  Counseling.           Einar Pheasant, MD

## 2015-11-24 ENCOUNTER — Encounter: Payer: Self-pay | Admitting: Internal Medicine

## 2015-11-24 DIAGNOSIS — F439 Reaction to severe stress, unspecified: Secondary | ICD-10-CM | POA: Insufficient documentation

## 2015-11-24 NOTE — Assessment & Plan Note (Signed)
No upper symptoms.  Controlled on protonix.  Follow.

## 2015-11-24 NOTE — Assessment & Plan Note (Signed)
Blood pressure under good control.  Continue same medication regimen.  Follow pressures.  Follow metabolic panel.   

## 2015-11-24 NOTE — Assessment & Plan Note (Signed)
Diet and exercise.  Follow.  

## 2015-11-24 NOTE — Assessment & Plan Note (Signed)
Saw Dr Sharlet Salina.  See his note for details.  Going to therapy.  Improved.  Due to f/u with Dr Sharlet Salina 12/13/15.

## 2015-11-24 NOTE — Assessment & Plan Note (Signed)
Sugars attached.  Doing better.  Follow met b and a1c.  Low carb diet and exercise.

## 2015-11-24 NOTE — Assessment & Plan Note (Signed)
S/p biopsy.  Followed by Dr Gerkin.  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  Follow.  

## 2015-11-24 NOTE — Assessment & Plan Note (Signed)
Increased stress with his sister's recent death.  Discussed with him today.  Counseling.

## 2015-11-24 NOTE — Assessment & Plan Note (Signed)
Persistent sinus congestion.  Planning to f/u with ENT.

## 2015-11-24 NOTE — Assessment & Plan Note (Signed)
CPAP.  

## 2015-11-24 NOTE — Assessment & Plan Note (Signed)
Low cholesterol diet and exercise.  On lipitor.  Follow lipid panel and liver function tests.   

## 2015-12-06 ENCOUNTER — Other Ambulatory Visit: Payer: Self-pay | Admitting: Internal Medicine

## 2015-12-19 ENCOUNTER — Telehealth: Payer: Self-pay | Admitting: Internal Medicine

## 2015-12-19 NOTE — Telephone Encounter (Signed)
Sent my chart message for update on how pt is doing.

## 2016-01-01 ENCOUNTER — Other Ambulatory Visit: Payer: Self-pay | Admitting: Internal Medicine

## 2016-02-01 ENCOUNTER — Other Ambulatory Visit: Payer: Self-pay | Admitting: Internal Medicine

## 2016-02-08 ENCOUNTER — Other Ambulatory Visit (INDEPENDENT_AMBULATORY_CARE_PROVIDER_SITE_OTHER): Payer: Medicare Other

## 2016-02-08 DIAGNOSIS — I10 Essential (primary) hypertension: Secondary | ICD-10-CM

## 2016-02-08 DIAGNOSIS — E7800 Pure hypercholesterolemia, unspecified: Secondary | ICD-10-CM

## 2016-02-08 DIAGNOSIS — E119 Type 2 diabetes mellitus without complications: Secondary | ICD-10-CM

## 2016-02-08 LAB — BASIC METABOLIC PANEL
BUN: 15 mg/dL (ref 6–23)
CALCIUM: 9.2 mg/dL (ref 8.4–10.5)
CO2: 28 meq/L (ref 19–32)
Chloride: 104 mEq/L (ref 96–112)
Creatinine, Ser: 0.85 mg/dL (ref 0.40–1.50)
GFR: 94.26 mL/min (ref >60.00)
GLUCOSE: 144 mg/dL — AB (ref 70–99)
Potassium: 3.5 mEq/L (ref 3.5–5.1)
SODIUM: 141 meq/L (ref 135–145)

## 2016-02-08 LAB — CBC WITH DIFFERENTIAL/PLATELET
Basophils Absolute: 0 K/uL (ref 0.0–0.1)
Basophils Relative: 0.5 % (ref 0.0–3.0)
Eosinophils Absolute: 0.1 K/uL (ref 0.0–0.7)
Eosinophils Relative: 0.9 % (ref 0.0–5.0)
HCT: 40.1 % (ref 39.0–52.0)
Hemoglobin: 13.6 g/dL (ref 13.0–17.0)
Lymphocytes Relative: 20.9 % (ref 12.0–46.0)
Lymphs Abs: 1.7 K/uL (ref 0.7–4.0)
MCHC: 33.9 g/dL (ref 30.0–36.0)
MCV: 87.3 fl (ref 78.0–100.0)
Monocytes Absolute: 0.4 K/uL (ref 0.1–1.0)
Monocytes Relative: 5.1 % (ref 3.0–12.0)
Neutro Abs: 5.8 K/uL (ref 1.4–7.7)
Neutrophils Relative %: 72.6 % (ref 43.0–77.0)
Platelets: 262 K/uL (ref 150.0–400.0)
RBC: 4.6 Mil/uL (ref 4.22–5.81)
RDW: 15.3 % (ref 11.5–15.5)
WBC: 8 K/uL (ref 4.0–10.5)

## 2016-02-08 LAB — HEPATIC FUNCTION PANEL
ALK PHOS: 38 U/L — AB (ref 39–117)
ALT: 19 U/L (ref 0–53)
AST: 18 U/L (ref 0–37)
Albumin: 4 g/dL (ref 3.5–5.2)
BILIRUBIN DIRECT: 0.1 mg/dL (ref 0.0–0.3)
BILIRUBIN TOTAL: 0.8 mg/dL (ref 0.2–1.2)
Total Protein: 6.9 g/dL (ref 6.0–8.3)

## 2016-02-08 LAB — LIPID PANEL
CHOL/HDL RATIO: 4
Cholesterol: 133 mg/dL (ref 0–200)
HDL: 32.7 mg/dL — ABNORMAL LOW (ref >39.00)
NONHDL: 99.83
Triglycerides: 228 mg/dL — ABNORMAL HIGH (ref 0.0–149.0)
VLDL: 45.6 mg/dL — AB (ref 0.0–40.0)

## 2016-02-08 LAB — HEMOGLOBIN A1C: Hgb A1c MFr Bld: 6.7 % — ABNORMAL HIGH (ref 4.6–6.5)

## 2016-02-08 LAB — LDL CHOLESTEROL, DIRECT: Direct LDL: 76 mg/dL

## 2016-02-11 ENCOUNTER — Encounter: Payer: Self-pay | Admitting: Internal Medicine

## 2016-02-12 ENCOUNTER — Ambulatory Visit: Payer: Medicare Other | Admitting: Internal Medicine

## 2016-02-17 ENCOUNTER — Other Ambulatory Visit: Payer: Self-pay | Admitting: Internal Medicine

## 2016-02-18 ENCOUNTER — Ambulatory Visit: Payer: Medicare Other | Admitting: Internal Medicine

## 2016-02-20 ENCOUNTER — Ambulatory Visit (INDEPENDENT_AMBULATORY_CARE_PROVIDER_SITE_OTHER): Payer: Medicare Other | Admitting: Internal Medicine

## 2016-02-20 ENCOUNTER — Encounter: Payer: Self-pay | Admitting: Internal Medicine

## 2016-02-20 DIAGNOSIS — I10 Essential (primary) hypertension: Secondary | ICD-10-CM

## 2016-02-20 DIAGNOSIS — M542 Cervicalgia: Secondary | ICD-10-CM

## 2016-02-20 DIAGNOSIS — E119 Type 2 diabetes mellitus without complications: Secondary | ICD-10-CM

## 2016-02-20 DIAGNOSIS — E669 Obesity, unspecified: Secondary | ICD-10-CM

## 2016-02-20 DIAGNOSIS — E7800 Pure hypercholesterolemia, unspecified: Secondary | ICD-10-CM

## 2016-02-20 DIAGNOSIS — Z23 Encounter for immunization: Secondary | ICD-10-CM | POA: Diagnosis not present

## 2016-02-20 DIAGNOSIS — M5442 Lumbago with sciatica, left side: Secondary | ICD-10-CM

## 2016-02-20 DIAGNOSIS — G4733 Obstructive sleep apnea (adult) (pediatric): Secondary | ICD-10-CM | POA: Diagnosis not present

## 2016-02-20 DIAGNOSIS — F439 Reaction to severe stress, unspecified: Secondary | ICD-10-CM | POA: Diagnosis not present

## 2016-02-20 DIAGNOSIS — K219 Gastro-esophageal reflux disease without esophagitis: Secondary | ICD-10-CM

## 2016-02-20 LAB — HM DIABETES EYE EXAM

## 2016-02-20 MED ORDER — HYDROCODONE-ACETAMINOPHEN 5-325 MG PO TABS: 1.0000 | ORAL_TABLET | Freq: Two times a day (BID) | ORAL | 0 refills | Status: DC | PRN

## 2016-02-20 NOTE — Progress Notes (Addendum)
Patient ID: Ricardo Nash, male   DOB: 06/26/1945, 70 y.o.   MRN: [id number]   Subjective:    Patient ID: Ricardo Nash, male    DOB: [birth year], 71 y.o.   MRN: [id number]  HPI  Patient here for a scheduled follow up. Saw Dr Ubaldo Glassing 02/06/16.  Stable.  No chest pain.  No sob.  Had labs 02/08/16.  a1c 6.7.  LDL 76.  Triglycerides 228.  Discussed low carb diet and exercise.  He does try to exercise on regular basis.  Still having issues with his neck and back.  Has seen Dr Sharlet Salina.  Went to therapy for his neck.  Was better initially.  Is having increased lower back pain.  Pain in right buttock to right hip.  Request referral to pain clinic.  Previously had surgery.  Is not ready for another surgery.  Pain does affect his function, etc.     Past Medical History:  Diagnosis Date  . C. difficile colitis   . Depression   . Diabetes mellitus ([place])   . Diverticulosis   . Environmental allergies   . GERD (gastroesophageal reflux disease)   . Goiter    intrathoracic, s/p benign biopsy (Dr Harlow Asa)  . Hypercholesterolemia   . Hypertension   . Osteoarthritis    cervical spine, lumbar spine  . Paroxysmal atrial fibrillation ([facility])   . TIA (transient ischemic attack)    Past Surgical History:  Procedure Laterality Date  . BACK SURGERY  8/08   s/p fusion of L4-L5  . CATARACT EXTRACTION  2011   Dr. Herbert Deaner  . [place] SURGERY  2002  . eyelid reduction     Dr. Carlis Abbott  . INGUINAL HERNIA REPAIR  1991   Dr, Tamala Julian  . NOSE SURGERY     turbinate reduction  . SEPTOPLASTY  1975  . SHOULDER SURGERY  2000   rotator cuff   Family History  Problem Relation Age of Onset  . Congestive Heart Failure Father   . Heart disease Father     myocardial infarction  . Rheumatic fever Father     valvular disease  . Heart disease Mother     s/p CABG (age 18)  . Colon cancer Neg Hx   . Prostate cancer Neg Hx    Social History   Social History  . Marital status: Married    Spouse name: N/A  .  Number of children: 2  . Years of education: N/A   Occupational History  . retired Pharmacist, hospital    Social History Main Topics  . Smoking status: Former Research scientist (life sciences)  . Smokeless tobacco: Never Used  . Alcohol use 0.0 oz/week     Comment: occasional  . Drug use: No  . Sexual activity: Not Asked   Other Topics Concern  . None   Social History Narrative  . None    Outpatient Encounter Prescriptions as of 02/20/2016  Medication Sig  . ACCU-CHEK AVIVA PLUS test strip USE TO TEST BLOOD SUGAR TWICE DAILY  . ALPRAZolam (XANAX) 0.25 MG tablet TAKE 1 TABLET BY MOUTH ONCE DAILY AS NEEDED.  Marland Kitchen amLODipine (NORVASC) 10 MG tablet Take 1 tablet (10 mg total) by mouth daily.  Marland Kitchen aspirin 81 MG tablet Take 81 mg by mouth daily.  Marland Kitchen atorvastatin (LIPITOR) 10 MG tablet Take 1 tablet (10 mg total) by mouth daily.  . cetirizine (ZYRTEC) 10 MG tablet Take 10 mg by mouth daily.  . clopidogrel (PLAVIX) 75 MG tablet TAKE ONE TABLET BY MOUTH  ONCE DAILY.  . fluticasone (FLONASE) 50 MCG/ACT nasal spray USE 2 SPRAYS IN EACH NOSTRIL ONCE DAILY.  Marland Kitchen glipiZIDE (GLUCOTROL XL) 2.5 MG 24 hr tablet TAKE 3 TABLETS BY MOUTH EVERY MORNING.  . hydrochlorothiazide (HYDRODIURIL) 25 MG tablet TAKE 1 TABLET BY MOUTH ONCE DAILY.  Marland Kitchen lisinopril (PRINIVIL,ZESTRIL) 40 MG tablet TAKE 1 TABLET BY MOUTH ONCE DAILY.  . metFORMIN (GLUCOPHAGE) 1000 MG tablet TAKE 1 TABLET BY MOUTH TWICE DAILY WITH MEALS.  . metoprolol succinate (TOPROL-XL) 25 MG 24 hr tablet Take 1 tablet (25 mg total) by mouth 2 (two) times daily.  . Multiple Vitamin (MULTIVITAMIN) tablet Take 1 tablet by mouth daily.  . Omega-3 Fatty Acids (FISH OIL) 1200 MG CAPS Take by mouth 4 (four) times daily.  . pantoprazole (PROTONIX) 40 MG tablet Take 1 tablet (40 mg total) by mouth daily.  . pioglitazone (ACTOS) 30 MG tablet TAKE 1 TABLET BY MOUTH ONCE DAILY.  . sildenafil (REVATIO) 20 MG tablet Take 20 mg by mouth as needed.  . tamsulosin (FLOMAX) 0.4 MG CAPS capsule Take 0.4 mg by  mouth daily with breakfast.   . testosterone cypionate (DEPOTESTOTERONE CYPIONATE) 200 MG/ML injection Inject 200 mg into the muscle every 14 (fourteen) days.  Marland Kitchen tiZANidine (ZANAFLEX) 2 MG tablet Take 2 mg by mouth 3 (three) times daily as needed.  . traMADol (ULTRAM) 50 MG tablet Take 1 tablet (50 mg total) by mouth 2 (two) times daily as needed.  . traZODone (DESYREL) 50 MG tablet TAKE 1 and 1/2 TABLETS BY MOUTH AT BEDTIME  . HYDROcodone-acetaminophen (NORCO) 5-325 MG tablet Take 1 tablet by mouth 2 (two) times daily as needed for moderate pain.   Facility-Administered Encounter Medications as of 02/20/2016  Medication  . methylPREDNISolone acetate (DEPO-MEDROL) injection 40 mg    Review of Systems  Constitutional: Negative for appetite change and unexpected weight change.  HENT: Negative for congestion and sinus pressure.   Respiratory: Negative for cough, chest tightness and shortness of breath.   Cardiovascular: Negative for chest pain, palpitations and leg swelling.  Gastrointestinal: Negative for abdominal pain, diarrhea, nausea and vomiting.  Genitourinary: Negative for difficulty urinating and dysuria.  Musculoskeletal: Positive for back pain and neck pain.  Skin: Negative for color change and rash.  Neurological: Negative for dizziness, light-headedness and headaches.  Psychiatric/Behavioral: Negative for agitation and dysphoric mood.       Objective:    Physical Exam  Constitutional: He appears well-developed and well-nourished. No distress.  HENT:  Nose: Nose normal.  Mouth/Throat: Oropharynx is clear and moist.  Neck: Neck supple. No thyromegaly present.  Cardiovascular: Normal rate and regular rhythm.   Pulmonary/Chest: Effort normal and breath sounds normal. No respiratory distress.  Abdominal: Soft. Bowel sounds are normal. There is no tenderness.  Musculoskeletal: He exhibits no edema or tenderness.  Lymphadenopathy:    He has no cervical adenopathy.  Skin: No  rash noted. No erythema.  Psychiatric: He has a normal mood and affect. His behavior is normal.    BP 138/60 (BP Location: Left Arm, Patient Position: Sitting, Cuff Size: Normal)   Pulse (!) 58   Temp 98.5 F (36.9 C) (Oral)   Wt 212 lb 12 oz (96.5 kg)   SpO2 96%   BMI 32.83 kg/m  Wt Readings from Last 3 Encounters:  02/20/16 212 lb 12 oz (96.5 kg)  11/23/15 219 lb 2 oz (99.4 kg)  09/07/15 225 lb 12.8 oz (102.4 kg)     Lab Results  Component Value Date  WBC 8.0 02/08/2016   HGB 13.6 02/08/2016   HCT 40.1 02/08/2016   PLT 262.0 02/08/2016   GLUCOSE 144 (H) 02/08/2016   CHOL 133 02/08/2016   TRIG 228.0 (H) 02/08/2016   HDL 32.70 (L) 02/08/2016   LDLDIRECT 76.0 02/08/2016   LDLCALC 58 11/21/2013   ALT 19 02/08/2016   AST 18 02/08/2016   NA 141 02/08/2016   K 3.5 02/08/2016   CL 104 02/08/2016   CREATININE 0.85 02/08/2016   BUN 15 02/08/2016   CO2 28 02/08/2016   TSH 0.84 04/27/2015   PSA 0.4 08/11/2014   HGBA1C 6.7 (H) 02/08/2016   MICROALBUR 9.1 (H) 10/02/2015    Dg Cervical Spine 2 Or 3 Views  Result Date: 09/10/2015 CLINICAL DATA:  Bilateral posterior neck pain, loss of motion beginning 1 month ago. Remote history of cervical fusion. EXAM: CERVICAL SPINE - 2-3 VIEW COMPARISON:  None. FINDINGS: Changes of prior anterior fusion at C5-6. Degenerative spurring anteriorly at C4-5 and C6-7. Disc spaces are maintained. Prevertebral soft tissues are normal. Mild degenerative facet disease bilaterally. IMPRESSION: No acute bony abnormality. Electronically Signed   By: Rolm Baptise M.D.   On: 09/10/2015 12:06       Assessment & Plan:   Problem List Items Addressed This Visit    Back pain    Recent increased pain in his back.  He is going to [place] for two weeks and request to have hydrocodone to take with him - just in case.  Rarely uses.  Request referral to pain clinic to see if can get back pain under control.  Not interested in pursuing another back surgery.         Relevant Medications   HYDROcodone-acetaminophen (NORCO) 5-325 MG tablet   Other Relevant Orders   Ambulatory referral to Pain Clinic   Diabetes mellitus ([place])    Low carb diet and exercise.  Follow met b and a1c.  a1c just checked 6.7.        Relevant Orders   Basic metabolic panel   Hemoglobin A1c   GERD (gastroesophageal reflux disease)    On protonix.  Reflux controlled.        Hypercholesterolemia    Low cholesterol diet and exercise.  Follow lipid panel and liver function tests.  On lipitor.        Relevant Orders   Lipid panel   Hepatic function panel   Hypertension    Blood pressure doing better.  Follow pressures.  Follow metabolic panel.       Relevant Orders   TSH   Neck pain    Has seen Dr Sharlet Salina.  See his note for details.  Has been to therapy.  Improved initially.  Pain has returned.  Request referral to pain clinic.        Relevant Orders   Ambulatory referral to Pain Clinic   Obesity (BMI 30-39.9)    Diet and exercise.  Follow.       Obstructive sleep apnea    CPAP.        Stress    Doing better.  Follow.         Other Visit Diagnoses    Encounter for immunization       Relevant Medications   HYDROcodone-acetaminophen (NORCO) 5-325 MG tablet   Other Relevant Orders   Flu vaccine HIGH DOSE PF (Completed)   Basic metabolic panel   Hemoglobin A1c   Lipid panel   Hepatic function panel   TSH  Einar Pheasant, MD

## 2016-02-20 NOTE — Progress Notes (Signed)
Pre visit review using our clinic review tool, if applicable. No additional management support is needed unless otherwise documented below in the visit note. 

## 2016-02-21 ENCOUNTER — Encounter: Payer: Self-pay | Admitting: Internal Medicine

## 2016-03-02 ENCOUNTER — Encounter: Payer: Self-pay | Admitting: Internal Medicine

## 2016-03-02 NOTE — Addendum Note (Signed)
Addended by: Alisa Graff on: 03/02/2016 10:40 AM   Modules accepted: Orders

## 2016-03-02 NOTE — Assessment & Plan Note (Signed)
Recent increased pain in his back.  He is going to [place] for two weeks and request to have hydrocodone to take with him - just in case.  Rarely uses.  Request referral to pain clinic to see if can get back pain under control.  Not interested in pursuing another back surgery.

## 2016-03-02 NOTE — Assessment & Plan Note (Signed)
Has seen Dr Sharlet Salina.  See his note for details.  Has been to therapy.  Improved initially.  Pain has returned.  Request referral to pain clinic.

## 2016-03-02 NOTE — Assessment & Plan Note (Signed)
On protonix.  Reflux controlled.  

## 2016-03-02 NOTE — Assessment & Plan Note (Signed)
Blood pressure doing better.  Follow pressures.  Follow metabolic panel.

## 2016-03-02 NOTE — Assessment & Plan Note (Signed)
Low cholesterol diet and exercise.  Follow lipid panel and liver function tests.  On lipitor.   

## 2016-03-02 NOTE — Assessment & Plan Note (Signed)
Doing better.  Follow.   

## 2016-03-02 NOTE — Assessment & Plan Note (Signed)
CPAP.  

## 2016-03-02 NOTE — Assessment & Plan Note (Signed)
Low carb diet and exercise.  Follow met b and a1c.  a1c just checked - 6.7.  

## 2016-03-02 NOTE — Assessment & Plan Note (Signed)
Diet and exercise.  Follow.  

## 2016-03-13 ENCOUNTER — Encounter: Payer: Self-pay | Admitting: Internal Medicine

## 2016-03-13 DIAGNOSIS — M542 Cervicalgia: Secondary | ICD-10-CM

## 2016-03-19 NOTE — Telephone Encounter (Signed)
Order placed for referral to Dr Chasnis.   

## 2016-03-21 ENCOUNTER — Other Ambulatory Visit: Payer: Self-pay | Admitting: Internal Medicine

## 2016-03-24 ENCOUNTER — Other Ambulatory Visit: Payer: Self-pay | Admitting: Physical Medicine and Rehabilitation

## 2016-03-24 DIAGNOSIS — M5412 Radiculopathy, cervical region: Secondary | ICD-10-CM

## 2016-03-28 ENCOUNTER — Other Ambulatory Visit: Payer: Self-pay | Admitting: Internal Medicine

## 2016-03-28 ENCOUNTER — Ambulatory Visit
Admission: RE | Admit: 2016-03-28 | Discharge: 2016-03-28 | Disposition: A | Discharge status: Home / Self Care | Payer: Medicare Other | Source: Ambulatory Visit | Attending: Physical Medicine and Rehabilitation | Admitting: Physical Medicine and Rehabilitation

## 2016-03-28 DIAGNOSIS — M5010 Cervical disc disorder with radiculopathy, unspecified cervical region: Secondary | ICD-10-CM | POA: Insufficient documentation

## 2016-03-28 DIAGNOSIS — Z981 Arthrodesis status: Secondary | ICD-10-CM | POA: Diagnosis not present

## 2016-03-28 DIAGNOSIS — M4802 Spinal stenosis, cervical region: Secondary | ICD-10-CM | POA: Diagnosis not present

## 2016-03-28 DIAGNOSIS — E049 Nontoxic goiter, unspecified: Secondary | ICD-10-CM | POA: Insufficient documentation

## 2016-03-28 DIAGNOSIS — M5412 Radiculopathy, cervical region: Secondary | ICD-10-CM | POA: Diagnosis present

## 2016-03-28 DIAGNOSIS — M5011 Cervical disc disorder with radiculopathy,  high cervical region: Secondary | ICD-10-CM | POA: Diagnosis not present

## 2016-03-28 DIAGNOSIS — G9589 Other specified diseases of spinal cord: Secondary | ICD-10-CM | POA: Diagnosis not present

## 2016-04-14 IMAGING — CT CT ABD-PELV W/ CM
1 of 3 series · 14 of 32 positions shown, 19 images · IV contrast (omnipaque)
Comparison: Abdominal MRI 05/21/2010

CLINICAL DATA: Persistent abdominal pain and bloating.

EXAM:
CT ABDOMEN AND PELVIS WITH CONTRAST
TECHNIQUE: Multidetector CT imaging of the abdomen and pelvis was performed
using the standard protocol following bolus administration of
intravenous contrast.
CONTRAST:  100mL OMNIPAQUE IOHEXOL 350 MG/ML SOLN

[Series 2: routine abd pel with · axial · 0.89mm/px · z∈[-774,-374]mm · 14 of 92 slices shown, 19 images]
[im 6/92  soft-tissue]
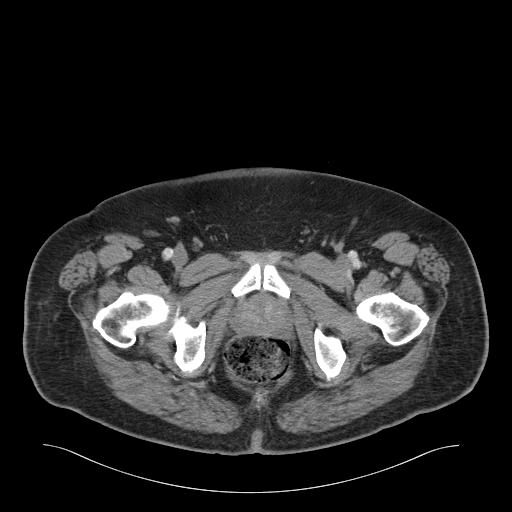
[im 6/92  bone]
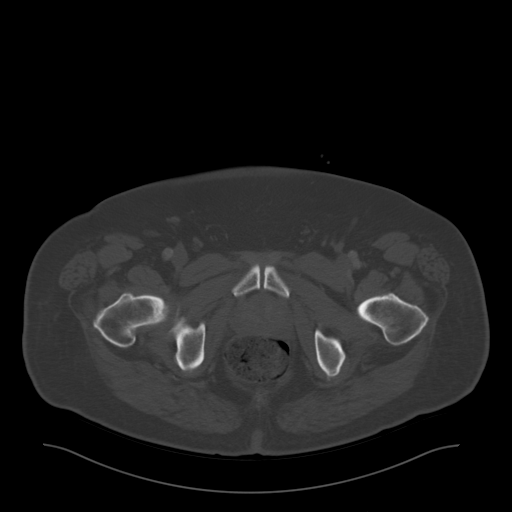
[im 11/92  soft-tissue]
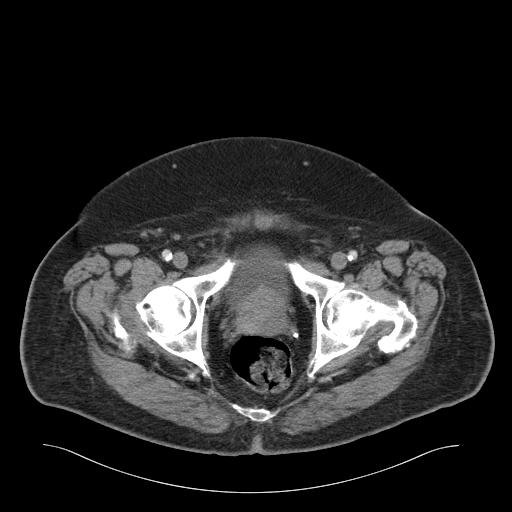
[im 21/92  soft-tissue]
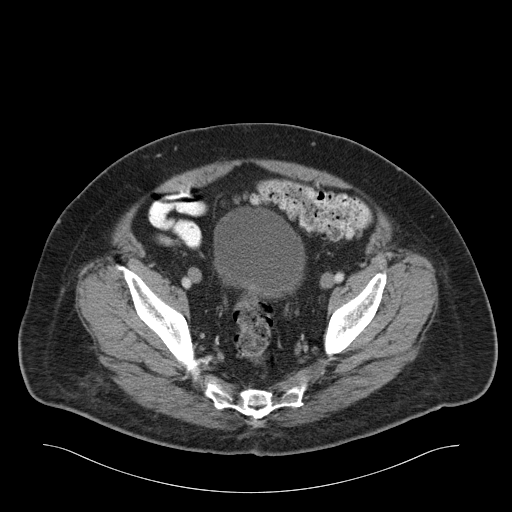
[im 26/92  soft-tissue]
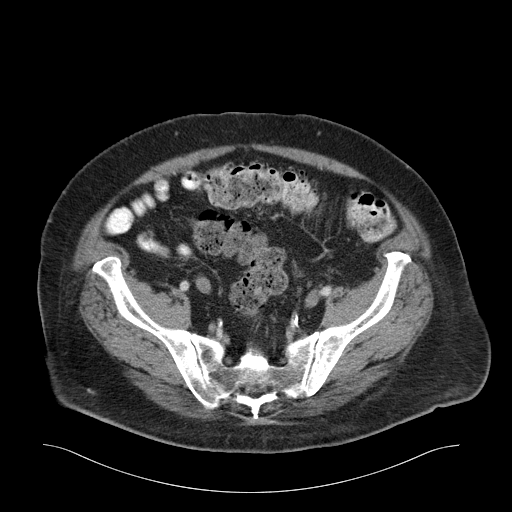
[im 31/92  soft-tissue]
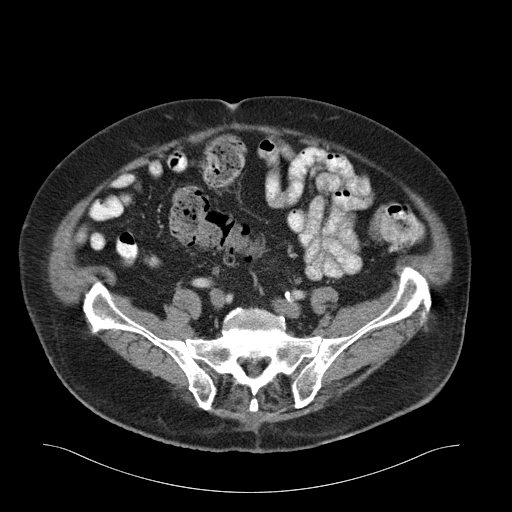
[im 41/92  soft-tissue]
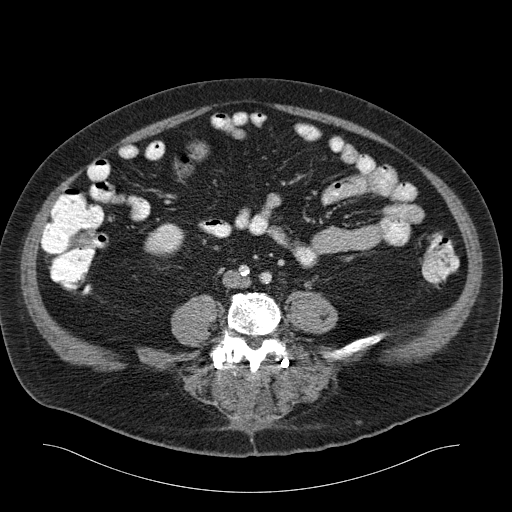
[im 46/92  soft-tissue]
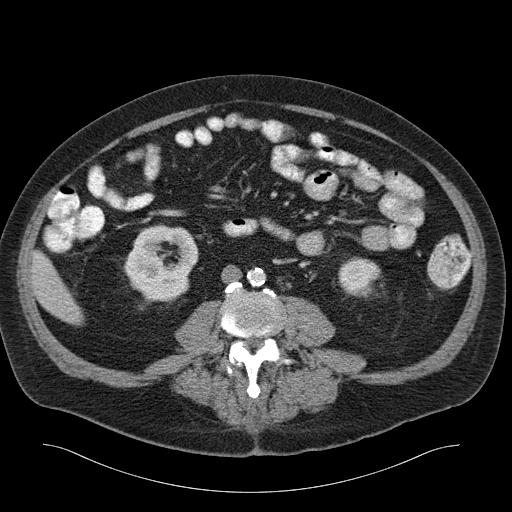
[im 51/92  soft-tissue]
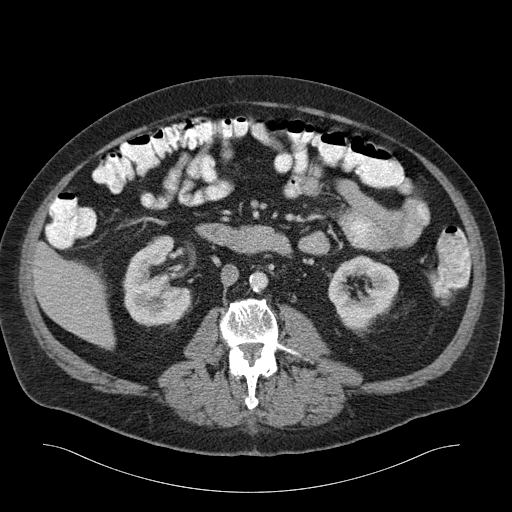
[im 61/92  soft-tissue]
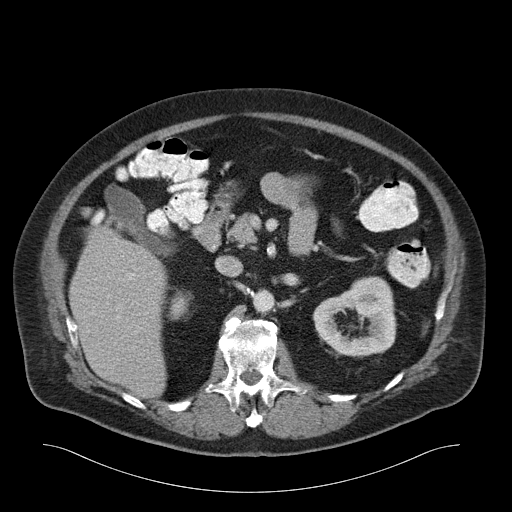
[im 61/92  bone]
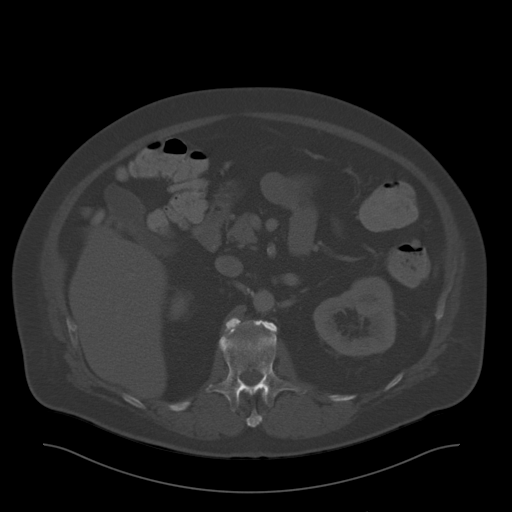
[im 66/92  soft-tissue]
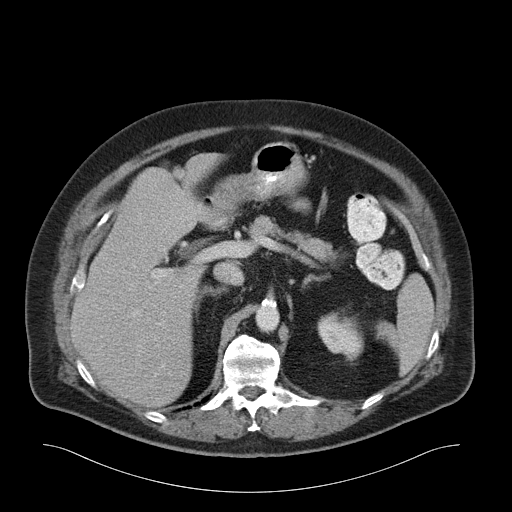
[im 71/92  soft-tissue]
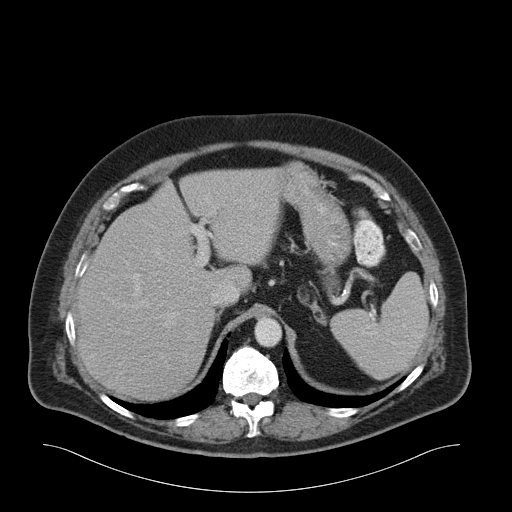
[im 71/92  lung]
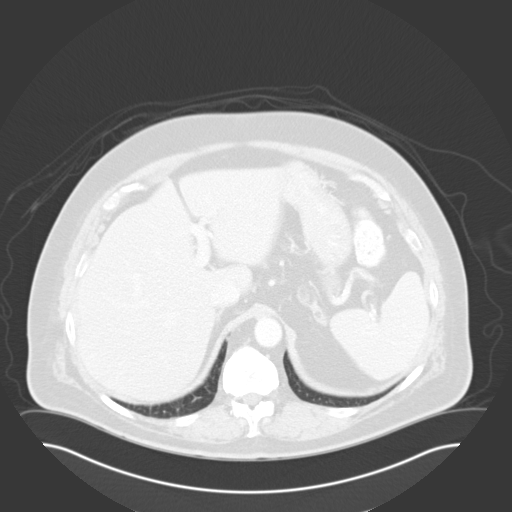
[im 76/92  lung]
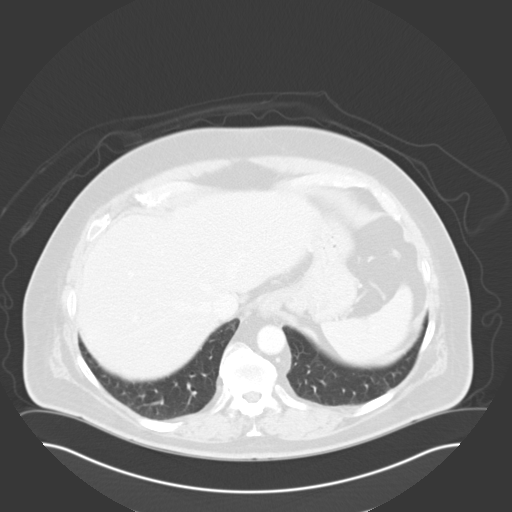
[im 81/92  soft-tissue]
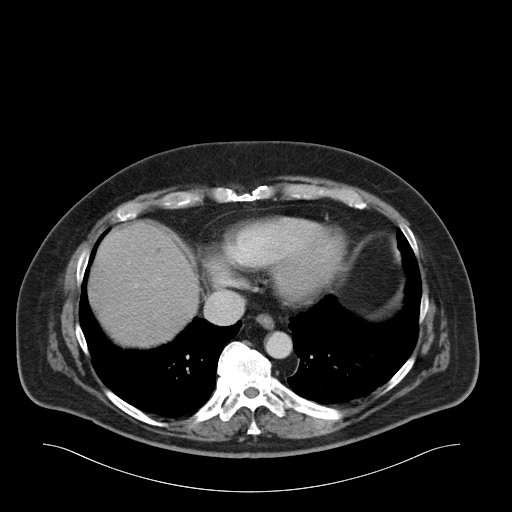
[im 81/92  lung]
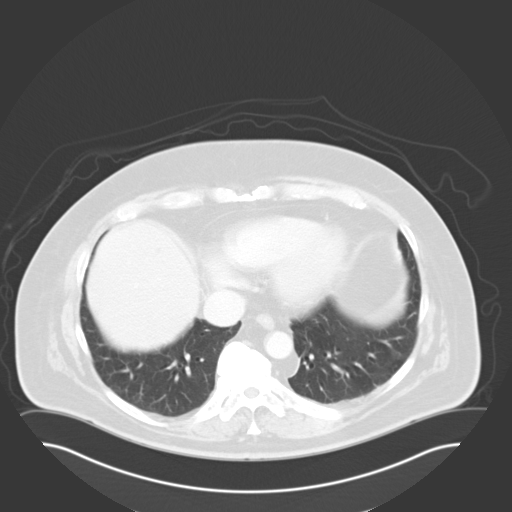
[im 86/92  soft-tissue]
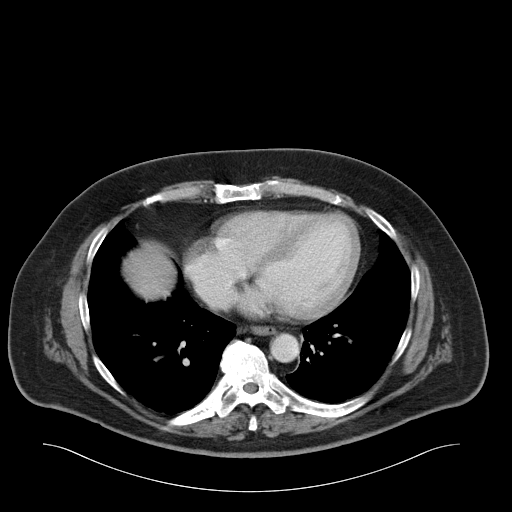
[im 86/92  lung]
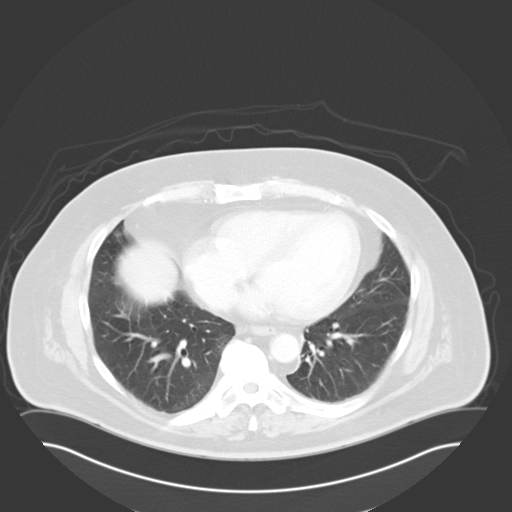

[14 of 32 positions shown; findings below may reference images not displayed]

FINDINGS: BODY WALL: No contributory findings.

LOWER CHEST: Borderline cardiomegaly. Coronary atherosclerotic
calcification noted.

Calcified granuloma in the right lower lobe.

ABDOMEN/PELVIS:

Liver: No focal abnormality.

Biliary: Cholelithiasis without inflammation or obstruction changes.

Pancreas: Unremarkable.

Spleen: Unremarkable.

Adrenals: Bilateral fatty adrenal masses up to 2 cm on the left,
consistent with myelolipomas.

Kidneys and ureters: No hydronephrosis or stone. Presumed 1 cm cyst
in the lower pole left kidney.

Bladder: Unremarkable.

Reproductive: Symmetric enlargement of the prostate, projecting into
the bladder.

Bowel: Moderate colonic stool distally without obstruction. Distal
colonic diverticulosis without active inflammation. Uncomplicated
mid duodenal diverticulum. No appendicitis.

Retroperitoneum: No mass or adenopathy.

Peritoneum: No ascites or pneumoperitoneum.

Vascular: No acute abnormality.

OSSEOUS: L4-5 discectomy with posterior rod and pedicle screw
fixation. No acute finding. Remote T12 compression fracture. There
is diffuse ankylosis of the lower thoracic spine.
IMPRESSION: 1. No specific explanation for patient's symptoms.
2. Colonic diverticulosis.
3. Cholelithiasis.

## 2016-04-30 ENCOUNTER — Other Ambulatory Visit: Payer: Self-pay | Admitting: Internal Medicine

## 2016-05-13 ENCOUNTER — Other Ambulatory Visit: Payer: Self-pay | Admitting: Internal Medicine

## 2016-05-22 ENCOUNTER — Ambulatory Visit (INDEPENDENT_AMBULATORY_CARE_PROVIDER_SITE_OTHER): Payer: Medicare Other | Admitting: Family Medicine

## 2016-05-22 ENCOUNTER — Encounter: Payer: Self-pay | Admitting: Family Medicine

## 2016-05-22 ENCOUNTER — Ambulatory Visit: Payer: Medicare Other

## 2016-05-22 ENCOUNTER — Other Ambulatory Visit: Payer: Medicare Other

## 2016-05-22 ENCOUNTER — Ambulatory Visit (INDEPENDENT_AMBULATORY_CARE_PROVIDER_SITE_OTHER): Payer: Medicare Other

## 2016-05-22 VITALS — BP 156/71 | HR 78 | Temp 99.4°F | Resp 16 | Wt 224.2 lb

## 2016-05-22 DIAGNOSIS — R69 Illness, unspecified: Secondary | ICD-10-CM

## 2016-05-22 DIAGNOSIS — J111 Influenza due to unidentified influenza virus with other respiratory manifestations: Secondary | ICD-10-CM

## 2016-05-22 LAB — POCT INFLUENZA A/B
INFLUENZA A, POC: NEGATIVE
INFLUENZA B, POC: NEGATIVE

## 2016-05-22 MED ORDER — OSELTAMIVIR PHOSPHATE 75 MG PO CAPS: 75.0000 mg | ORAL_CAPSULE | Freq: Two times a day (BID) | ORAL | 0 refills | Status: DC

## 2016-05-22 NOTE — Progress Notes (Signed)
Subjective:  Patient ID: Ricardo Nash, male    DOB: [birth year]  Age: 71 y.o. MRN: [id number]  CC: Cough, Fever, Congestion, Body aches  HPI:  71 year old male with a past medical history of hypertension, hyperlipidemia, DM 2, former smoker presents with the above complaints.  Patient states that he's been sick since Tuesday. He's had cough, congestion. Last night he developed fever, Tmax 101. He's had associated body aches. Symptoms are severe. No reported sick contacts. He's been taking Mucinex and Robitussin without significant improvement. No known exacerbating or relieving factors. No other complaints or concerns at this time.  Social Hx   Social History   Social History  . Marital status: Married    Spouse name: N/A  . Number of children: 2  . Years of education: N/A   Occupational History  . retired Pharmacist, hospital    Social History Main Topics  . Smoking status: Former Research scientist (life sciences)  . Smokeless tobacco: Never Used  . Alcohol use 0.0 oz/week     Comment: occasional  . Drug use: No  . Sexual activity: Not Asked   Other Topics Concern  . None   Social History Narrative  . None    Review of Systems  Constitutional: Positive for fever.  HENT: Positive for congestion.   Respiratory: Positive for cough.   Musculoskeletal:       Body aches.    Objective:  BP (!) 156/71   Pulse 78   Temp 99.4 F (37.4 C) (Oral)   Resp 16   Wt 224 lb 3.2 oz (101.7 kg)   SpO2 94%   BMI 34.60 kg/m   BP/Weight 05/22/2016 02/20/2016 [id number]  Systolic BP [id number] [id number]  Diastolic BP 71 60 62  Wt. (Lbs) 224.2 212.75 219.13  BMI 34.6 32.83 33.79    Physical Exam  Constitutional: He is oriented to person, place, and time. He appears well-developed. No distress.  HENT:  Mouth/Throat: Oropharynx is clear and moist.  Normal TMs bilaterally.  Cardiovascular: Normal rate and regular rhythm.   Pulmonary/Chest: Effort normal.  Right basilar crackles.  Lymphadenopathy:    He has no  cervical adenopathy.  Neurological: He is alert and oriented to person, place, and time.  Psychiatric: He has a normal mood and affect.  Vitals reviewed.  Lab Results  Component Value Date   WBC 8.0 02/08/2016   HGB 13.6 02/08/2016   HCT 40.1 02/08/2016   PLT 262.0 02/08/2016   GLUCOSE 144 (H) 02/08/2016   CHOL 133 02/08/2016   TRIG 228.0 (H) 02/08/2016   HDL 32.70 (L) 02/08/2016   LDLDIRECT 76.0 02/08/2016   LDLCALC 58 11/21/2013   ALT 19 02/08/2016   AST 18 02/08/2016   NA 141 02/08/2016   K 3.5 02/08/2016   CL 104 02/08/2016   CREATININE 0.85 02/08/2016   BUN 15 02/08/2016   CO2 28 02/08/2016   TSH 0.84 04/27/2015   PSA 0.4 08/11/2014   HGBA1C 6.7 (H) 02/08/2016   MICROALBUR 9.1 (H) 10/02/2015    Assessment & Plan:   Problem List Items Addressed This Visit    Influenza-like illness - Primary    New problem. Patient with symptoms of influenza. Influenza test was negative. However, I'm still concerned about influenza. He does have focal lung findings as well, obtaining chest x-ray. Given comorbidities and presentation, treating empirically for the flu while awaiting his chest x-ray.      Relevant Medications   oseltamivir (TAMIFLU) 75 MG capsule   Other Relevant Orders  POCT Influenza A/B   DG Chest 2 View      Meds ordered this encounter  Medications  . oseltamivir (TAMIFLU) 75 MG capsule    Sig: Take 1 capsule (75 mg total) by mouth 2 (two) times daily.    Dispense:  10 capsule    Refill:  0    Follow-up: PRN  [place]

## 2016-05-22 NOTE — Patient Instructions (Signed)
Take the medication.  We will call with the xray results.  Take care  Dr. Lacinda Axon

## 2016-05-22 NOTE — Assessment & Plan Note (Signed)
New problem. Patient with symptoms of influenza. Influenza test was negative. However, I'm still concerned about influenza. He does have focal lung findings as well, obtaining chest x-ray. Given comorbidities and presentation, treating empirically for the flu while awaiting his chest x-ray.

## 2016-05-22 NOTE — Progress Notes (Signed)
Pre visit review using our clinic review tool, if applicable. No additional management support is needed unless otherwise documented below in the visit note. 

## 2016-05-27 ENCOUNTER — Encounter: Payer: Self-pay | Admitting: Internal Medicine

## 2016-05-27 ENCOUNTER — Ambulatory Visit (INDEPENDENT_AMBULATORY_CARE_PROVIDER_SITE_OTHER): Payer: Medicare Other | Admitting: Internal Medicine

## 2016-05-27 ENCOUNTER — Ambulatory Visit (INDEPENDENT_AMBULATORY_CARE_PROVIDER_SITE_OTHER): Payer: Medicare Other

## 2016-05-27 VITALS — BP 144/76 | HR 66 | Temp 98.2°F | Ht 68.0 in | Wt 221.2 lb

## 2016-05-27 DIAGNOSIS — E7800 Pure hypercholesterolemia, unspecified: Secondary | ICD-10-CM | POA: Diagnosis not present

## 2016-05-27 DIAGNOSIS — R05 Cough: Secondary | ICD-10-CM

## 2016-05-27 DIAGNOSIS — R062 Wheezing: Secondary | ICD-10-CM

## 2016-05-27 DIAGNOSIS — E119 Type 2 diabetes mellitus without complications: Secondary | ICD-10-CM | POA: Diagnosis not present

## 2016-05-27 DIAGNOSIS — R059 Cough, unspecified: Secondary | ICD-10-CM

## 2016-05-27 DIAGNOSIS — E049 Nontoxic goiter, unspecified: Secondary | ICD-10-CM | POA: Diagnosis not present

## 2016-05-27 DIAGNOSIS — E669 Obesity, unspecified: Secondary | ICD-10-CM

## 2016-05-27 DIAGNOSIS — G4733 Obstructive sleep apnea (adult) (pediatric): Secondary | ICD-10-CM | POA: Diagnosis not present

## 2016-05-27 DIAGNOSIS — K219 Gastro-esophageal reflux disease without esophagitis: Secondary | ICD-10-CM

## 2016-05-27 DIAGNOSIS — I10 Essential (primary) hypertension: Secondary | ICD-10-CM | POA: Diagnosis not present

## 2016-05-27 MED ORDER — PREDNISONE 10 MG PO TABS
ORAL_TABLET | ORAL | 0 refills | Status: DC
Start: 2016-05-27 — End: 2016-06-11

## 2016-05-27 MED ORDER — ALBUTEROL SULFATE HFA 108 (90 BASE) MCG/ACT IN AERS: 2.0000 | INHALATION_SPRAY | Freq: Four times a day (QID) | RESPIRATORY_TRACT | 1 refills | Status: DC | PRN

## 2016-05-27 MED ORDER — ALBUTEROL SULFATE (2.5 MG/3ML) 0.083% IN NEBU
2.5000 mg | INHALATION_SOLUTION | Freq: Once | RESPIRATORY_TRACT | Status: AC
  Administered 2016-05-27: 2.5 mg via RESPIRATORY_TRACT

## 2016-05-27 NOTE — Progress Notes (Signed)
Patient ID: Ricardo Nash, male   DOB: 1946/02/25, 71 y.o.   MRN: [id number]   Subjective:    Patient ID: Ricardo Nash, male    DOB: 03-03-1946, 71 y.o.   MRN: [id number]  HPI  Patient here for a scheduled follow up.  He was diagnosed with sinus infection at [facility].  Took doxycycline.  Worked well for him.  Has now developed some nasal congestion and increased cough.  Using saline nasal spray and steroid nasal spray.  No chest tightness.  No increased sob.  No acid reflux.  No nausea or vomiting.  Minimal diarrhea.  Was seen 05/22/16 by Dr Lacinda Axon.  Note reviewed.  Concern over the flu.  Treated with tamiflu.  Cough has increased.  Increased wheezing.     Past Medical History:  Diagnosis Date  . C. difficile colitis   . Depression   . Diabetes mellitus ([place])   . Diverticulosis   . Environmental allergies   . GERD (gastroesophageal reflux disease)   . Goiter    intrathoracic, s/p benign biopsy (Dr Harlow Asa)  . Hypercholesterolemia   . Hypertension   . Osteoarthritis    cervical spine, lumbar spine  . Paroxysmal atrial fibrillation (HCC)   . TIA (transient ischemic attack)    Past Surgical History:  Procedure Laterality Date  . BACK SURGERY  8/08   s/p fusion of L4-L5  . CATARACT EXTRACTION  2011   Dr. Herbert Deaner  . [place] SURGERY  2002  . eyelid reduction     Dr. Carlis Abbott  . INGUINAL HERNIA REPAIR  1991   Dr, Tamala Julian  . NOSE SURGERY     turbinate reduction  . SEPTOPLASTY  1975  . SHOULDER SURGERY  2000   rotator cuff   Family History  Problem Relation Age of Onset  . Congestive Heart Failure Father   . Heart disease Father     myocardial infarction  . Rheumatic fever Father     valvular disease  . Heart disease Mother     s/p CABG (age 63)  . Colon cancer Neg Hx   . Prostate cancer Neg Hx    Social History   Social History  . Marital status: Married    Spouse name: N/A  . Number of children: 2  . Years of education: N/A   Occupational History  . retired  Pharmacist, hospital    Social History Main Topics  . Smoking status: Former Research scientist (life sciences)  . Smokeless tobacco: Never Used  . Alcohol use 0.0 oz/week     Comment: occasional  . Drug use: No  . Sexual activity: Not Asked   Other Topics Concern  . None   Social History Narrative  . None    Outpatient Encounter Prescriptions as of 05/27/2016  Medication Sig  . ACCU-CHEK AVIVA PLUS test strip USE TO TEST BLOOD SUGAR TWICE DAILY  . ALPRAZolam (XANAX) 0.25 MG tablet TAKE 1 TABLET BY MOUTH ONCE DAILY AS NEEDED.  Marland Kitchen amLODipine (NORVASC) 10 MG tablet TAKE 1 TABLET BY MOUTH ONCE DAILY.  Marland Kitchen aspirin 81 MG tablet Take 81 mg by mouth daily.  Marland Kitchen atorvastatin (LIPITOR) 10 MG tablet TAKE 1 TABLET BY MOUTH ONCE DAILY.  . cetirizine (ZYRTEC) 10 MG tablet Take 10 mg by mouth daily.  . clopidogrel (PLAVIX) 75 MG tablet TAKE ONE TABLET BY MOUTH ONCE DAILY.  . fluticasone (FLONASE) 50 MCG/ACT nasal spray USE 2 SPRAYS IN EACH NOSTRIL ONCE DAILY.  Marland Kitchen glipiZIDE (GLUCOTROL XL) 2.5 MG 24 hr tablet  TAKE 3 TABLETS BY MOUTH EVERY MORNING.  . hydrochlorothiazide (HYDRODIURIL) 25 MG tablet TAKE 1 TABLET BY MOUTH ONCE DAILY  . HYDROcodone-acetaminophen (NORCO) 5-325 MG tablet Take 1 tablet by mouth 2 (two) times daily as needed for moderate pain.  Marland Kitchen lisinopril (PRINIVIL,ZESTRIL) 40 MG tablet TAKE 1 TABLET BY MOUTH ONCE DAILY.  . metFORMIN (GLUCOPHAGE) 1000 MG tablet TAKE 1 TABLET BY MOUTH TWICE DAILY WITH MEALS  . metoprolol succinate (TOPROL-XL) 25 MG 24 hr tablet Take 1 tablet (25 mg total) by mouth 2 (two) times daily.  . Multiple Vitamin (MULTIVITAMIN) tablet Take 1 tablet by mouth daily.  . Omega-3 Fatty Acids (FISH OIL) 1200 MG CAPS Take by mouth 4 (four) times daily.  . pantoprazole (PROTONIX) 40 MG tablet TAKE 1 TABLET BY MOUTH ONCE DAILY.  Marland Kitchen pioglitazone (ACTOS) 30 MG tablet TAKE ONE TABLET BY MOUTH ONCE DAILY  . sildenafil (REVATIO) 20 MG tablet Take 20 mg by mouth as needed.  . tamsulosin (FLOMAX) 0.4 MG CAPS capsule Take  0.4 mg by mouth daily with breakfast.   . testosterone cypionate (DEPOTESTOTERONE CYPIONATE) 200 MG/ML injection Inject 200 mg into the muscle every 14 (fourteen) days.  Marland Kitchen tiZANidine (ZANAFLEX) 2 MG tablet Take 2 mg by mouth 3 (three) times daily as needed.  . traMADol (ULTRAM) 50 MG tablet Take 1 tablet (50 mg total) by mouth 2 (two) times daily as needed.  . traZODone (DESYREL) 50 MG tablet TAKE 1.5 TABLETS BY MOUTH AT BEDTIME  . [DISCONTINUED] oseltamivir (TAMIFLU) 75 MG capsule Take 1 capsule (75 mg total) by mouth 2 (two) times daily.  Marland Kitchen albuterol (PROVENTIL HFA;VENTOLIN HFA) 108 (90 Base) MCG/ACT inhaler Inhale 2 puffs into the lungs every 6 (six) hours as needed for wheezing or shortness of breath.  . predniSONE (DELTASONE) 10 MG tablet Take 6 tablets x 1 day and then decrease by 1/2 tablet per day until down to zero mg.   Facility-Administered Encounter Medications as of 05/27/2016  Medication  . [COMPLETED] albuterol (PROVENTIL) (2.5 MG/3ML) 0.083% nebulizer solution 2.5 mg  . methylPREDNISolone acetate (DEPO-MEDROL) injection 40 mg    Review of Systems  Constitutional: Negative for appetite change and unexpected weight change.  HENT: Positive for congestion and postnasal drip. Negative for sinus pressure.   Respiratory: Positive for cough and wheezing. Negative for chest tightness and shortness of breath.   Cardiovascular: Negative for chest pain, palpitations and leg swelling.  Gastrointestinal: Negative for abdominal pain, diarrhea, nausea and vomiting.  Genitourinary: Negative for difficulty urinating and dysuria.  Musculoskeletal:       Neck is better.  Back stable.    Skin: Negative for color change and rash.  Neurological: Negative for dizziness, light-headedness and headaches.  Psychiatric/Behavioral: Negative for agitation and dysphoric mood.       Objective:    Physical Exam  Constitutional: He appears well-developed and well-nourished. No distress.  HENT:    Mouth/Throat: Oropharynx is clear and moist.  Nares - slightly erythematous turbinates.  No significant tenderness to palpation over the sinuses.    Neck: Neck supple. No thyromegaly present.  Cardiovascular: Normal rate and regular rhythm.   Pulmonary/Chest: Effort normal and breath sounds normal. No respiratory distress.  Increased cough with forced expiration.  Wheezing.    Abdominal: Soft. Bowel sounds are normal. There is no tenderness.  Musculoskeletal: He exhibits no edema or tenderness.  Lymphadenopathy:    He has no cervical adenopathy.  Skin: No rash noted. No erythema.  Psychiatric: He has a normal  mood and affect. His behavior is normal.    BP (!) 144/76   Pulse 66   Temp 98.2 F (36.8 C) (Oral)   Ht '5\' 8"'  (1.727 m)   Wt 221 lb 3.2 oz (100.3 kg)   SpO2 94%   BMI 33.63 kg/m  Wt Readings from Last 3 Encounters:  05/27/16 221 lb 3.2 oz (100.3 kg)  05/22/16 224 lb 3.2 oz (101.7 kg)  02/20/16 212 lb 12 oz (96.5 kg)     Lab Results  Component Value Date   WBC 8.0 02/08/2016   HGB 13.6 02/08/2016   HCT 40.1 02/08/2016   PLT 262.0 02/08/2016   GLUCOSE 144 (H) 02/08/2016   CHOL 133 02/08/2016   TRIG 228.0 (H) 02/08/2016   HDL 32.70 (L) 02/08/2016   LDLDIRECT 76.0 02/08/2016   LDLCALC 58 11/21/2013   ALT 19 02/08/2016   AST 18 02/08/2016   NA 141 02/08/2016   K 3.5 02/08/2016   CL 104 02/08/2016   CREATININE 0.85 02/08/2016   BUN 15 02/08/2016   CO2 28 02/08/2016   TSH 0.84 04/27/2015   PSA 0.4 08/11/2014   HGBA1C 6.7 (H) 02/08/2016   MICROALBUR 9.1 (H) 10/02/2015    Mr Cervical Spine Wo Contrast  Result Date: 03/28/2016 CLINICAL DATA:  Cervical radiculitis. Neck pain and stiffness for 2-3 months. Bilateral shoulder pain. Prior cervical spine fusion. EXAM: MRI CERVICAL SPINE WITHOUT CONTRAST TECHNIQUE: Multiplanar, multisequence MR imaging of the cervical spine was performed. No intravenous contrast was administered. COMPARISON:  Cervical spine  radiographs 09/10/2015. Chest CT 07/31/2010. FINDINGS: Alignment: Normal. Vertebrae: Preserved vertebral body height without evidence of fracture. Prior C5-6 ACDF with solid osseous fusion. No osseous lesion or significant marrow edema. Cord: Focal T2 hyperintensity in the spinal cord at C5-6 with slight volume loss consistent with myelomalacia and likely related to prior stenosis. Posterior Fossa, vertebral arteries, paraspinal tissues: Partially visualized thyroid goiter with left greater than right lobe enlargement as seen on prior CT, incompletely evaluated. Disc levels: C2-3:  Small right central disc protrusion without stenosis. C3-4: Disc bulging, right uncovertebral spurring, and infolding of the ligamentum flavum result in mild right neural foraminal stenosis and mild spinal stenosis. C4-5: Minimal disc space height loss. Disc bulging, right paracentral disc protrusion, infolding of the ligamentum flavum, right greater left uncovertebral spurring, and mild left facet arthrosis result in severe spinal stenosis with mild cord flattening and severe bilateral neural foraminal stenosis. C5-6:  Prior ACDF.  No significant residual stenosis. C6-7: Disc bulging and right paracentral disc protrusion result in mild spinal stenosis and mild to moderate right and mild left neural foraminal stenosis. C7-T1: Small central disc extrusion results in mild spinal stenosis with a slight impression on the ventral spinal cord. Mild right uncovertebral spurring and minimal facet arthrosis result in mild right neural foraminal stenosis. IMPRESSION: 1. Solid C5-6 ACDF without residual stenosis. 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 2. 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 3. Mild spinal stenosis at C3-4 and C6-7. Electronically Signed   By: Logan Bores M.D.   On: 03/28/2016 16:22       Assessment & Plan:   Problem List Items Addressed This Visit    Diabetes mellitus  ([place])    Low carb diet and exercise.  Follow met b and a1c.  Instructed to follow sugars on prednisone.  Follow.        GERD (gastroesophageal reflux disease)    On protonix.  Controlled.  Hypercholesterolemia    On lipitor.  Follow lipid panel and liver function tests.        Hypertension    Blood pressure has been under good control.  Slight elevation initially today.  Treat cough and congestion.  Follow pressures.  Hold on making changes in medication.  Follow.        Intrathoracic goiter    S/p biopsy.  Followed by Dr Harlow Asa.  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  Follow.        Obesity (BMI 30-39.9)    Diet and exercise.        Obstructive sleep apnea    CPAP.       Wheezing - Primary    Increased cough, congestion and wheezing.  Albuterol neb given today.  Increased air movement after neb.  Will recheck cxr given increased congestion and wheezing.  Hold on abx pending cxr review.   Prednisone taper and inhaler as directed.  Follow closely.        Relevant Medications   albuterol (PROVENTIL) (2.5 MG/3ML) 0.083% nebulizer solution 2.5 mg (Completed)   Other Relevant Orders   DG Chest 2 View (Completed)    Other Visit Diagnoses    Cough       Relevant Orders   DG Chest 2 View (Completed)       Einar Pheasant, MD

## 2016-05-27 NOTE — Progress Notes (Signed)
Pre visit review using our clinic review tool, if applicable. No additional management support is needed unless otherwise documented below in the visit note. 

## 2016-05-28 ENCOUNTER — Encounter: Payer: Self-pay | Admitting: Internal Medicine

## 2016-05-29 ENCOUNTER — Encounter: Payer: Self-pay | Admitting: Internal Medicine

## 2016-05-29 DIAGNOSIS — R062 Wheezing: Secondary | ICD-10-CM | POA: Insufficient documentation

## 2016-05-29 NOTE — Assessment & Plan Note (Signed)
Blood pressure has been under good control.  Slight elevation initially today.  Treat cough and congestion.  Follow pressures.  Hold on making changes in medication.  Follow.

## 2016-05-29 NOTE — Assessment & Plan Note (Signed)
Increased cough, congestion and wheezing.  Albuterol neb given today.  Increased air movement after neb.  Will recheck cxr given increased congestion and wheezing.  Hold on abx pending cxr review.   Prednisone taper and inhaler as directed.  Follow closely.

## 2016-05-29 NOTE — Assessment & Plan Note (Signed)
Low carb diet and exercise.  Follow met b and a1c.  Instructed to follow sugars on prednisone.  Follow.

## 2016-05-29 NOTE — Assessment & Plan Note (Signed)
On lipitor.  Follow lipid panel and liver function tests.   

## 2016-05-29 NOTE — Assessment & Plan Note (Signed)
S/p biopsy.  Followed by Dr Gerkin.  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  Follow.  

## 2016-05-29 NOTE — Assessment & Plan Note (Signed)
On protonix.  Controlled.   

## 2016-05-29 NOTE — Assessment & Plan Note (Signed)
CPAP.  

## 2016-05-29 NOTE — Assessment & Plan Note (Signed)
Diet and exercise.   

## 2016-06-01 ENCOUNTER — Encounter: Payer: Self-pay | Admitting: Internal Medicine

## 2016-06-02 MED ORDER — ALPRAZOLAM 0.25 MG PO TABS: ORAL_TABLET | ORAL | 1 refills | Status: DC

## 2016-06-02 NOTE — Telephone Encounter (Signed)
Patient calls back states [facility] RN was out doing there annual wellness visit, and she listened to patients lungs and heard wheezing in lower left lobe.  Patient states still coughing up white mucus cough worse during the day no fever.    Still taking Prednisone , Robutussin , Mucinex DM.  Please advise.

## 2016-06-02 NOTE — Telephone Encounter (Signed)
Left message to call on home phone .

## 2016-06-02 NOTE — Telephone Encounter (Signed)
Last OV 05/27/16 tried to reach patient by phone for more detail of cough but had to leave message. Ok to refill alprazolam?

## 2016-06-02 NOTE — Telephone Encounter (Signed)
Is he using the inhaler.  If not, would recommend using the inhaler q 6 hours prn.  Also, if persistent symptoms, sounds like needs to be reevaluated.

## 2016-06-02 NOTE — Telephone Encounter (Signed)
ok'd rx for xanax.  Signed and placed on your chair.  I just saw him for increased cough. Placed on prednisone.  Agree with more information.  If doing better, may take a while to completely clear cough.  If persistent or worsening symptoms, will need reevaluation.

## 2016-06-03 NOTE — Telephone Encounter (Signed)
Patient called back states he has been using the Albuterol inhaler on regular basis as directed .  Please advise.

## 2016-06-03 NOTE — Telephone Encounter (Signed)
If persistent symptoms, will need to be reevaluated.  If the 10:00 spot on Thursday is not being held for someone, please have him come in then.  Thanks.

## 2016-06-05 ENCOUNTER — Other Ambulatory Visit (INDEPENDENT_AMBULATORY_CARE_PROVIDER_SITE_OTHER): Payer: Medicare Other

## 2016-06-05 ENCOUNTER — Ambulatory Visit (INDEPENDENT_AMBULATORY_CARE_PROVIDER_SITE_OTHER): Payer: Medicare Other

## 2016-06-05 VITALS — BP 142/72 | HR 60 | Temp 97.7°F | Resp 14 | Ht 68.0 in | Wt 219.0 lb

## 2016-06-05 DIAGNOSIS — I10 Essential (primary) hypertension: Secondary | ICD-10-CM

## 2016-06-05 DIAGNOSIS — E119 Type 2 diabetes mellitus without complications: Secondary | ICD-10-CM

## 2016-06-05 DIAGNOSIS — Z23 Encounter for immunization: Secondary | ICD-10-CM | POA: Diagnosis not present

## 2016-06-05 DIAGNOSIS — Z0000 Encounter for general adult medical examination without abnormal findings: Secondary | ICD-10-CM | POA: Diagnosis not present

## 2016-06-05 DIAGNOSIS — E7800 Pure hypercholesterolemia, unspecified: Secondary | ICD-10-CM

## 2016-06-05 LAB — BASIC METABOLIC PANEL
BUN: 22 mg/dL (ref 6–23)
CALCIUM: 10.1 mg/dL (ref 8.4–10.5)
CO2: 32 meq/L (ref 19–32)
CREATININE: 0.93 mg/dL (ref 0.40–1.50)
Chloride: 100 mEq/L (ref 96–112)
GFR: 84.88 mL/min (ref >60.00)
Glucose, Bld: 154 mg/dL — ABNORMAL HIGH (ref 70–99)
Potassium: 4.2 mEq/L (ref 3.5–5.1)
Sodium: 141 mEq/L (ref 135–145)

## 2016-06-05 LAB — LIPID PANEL
CHOL/HDL RATIO: 4
CHOLESTEROL: 183 mg/dL (ref 0–200)
HDL: 48.7 mg/dL (ref >39.00)
NonHDL: 134.71
Triglycerides: 332 mg/dL — ABNORMAL HIGH (ref 0.0–149.0)
VLDL: 66.4 mg/dL — ABNORMAL HIGH (ref 0.0–40.0)

## 2016-06-05 LAB — HEMOGLOBIN A1C: HEMOGLOBIN A1C: 7.2 % — AB (ref 4.6–6.5)

## 2016-06-05 LAB — HEPATIC FUNCTION PANEL
ALK PHOS: 48 U/L (ref 39–117)
ALT: 29 U/L (ref 0–53)
AST: 18 U/L (ref 0–37)
Albumin: 4.2 g/dL (ref 3.5–5.2)
BILIRUBIN DIRECT: 0.2 mg/dL (ref 0.0–0.3)
TOTAL PROTEIN: 6.6 g/dL (ref 6.0–8.3)
Total Bilirubin: 0.9 mg/dL (ref 0.2–1.2)

## 2016-06-05 LAB — TSH: TSH: 0.45 u[IU]/mL (ref 0.35–4.50)

## 2016-06-05 LAB — LDL CHOLESTEROL, DIRECT: Direct LDL: 105 mg/dL

## 2016-06-05 NOTE — Progress Notes (Signed)
Subjective:   Ricardo Nash is a 71 y.o. male who presents for an Initial Medicare Annual Wellness Visit.  Review of Systems  No ROS.  Medicare Wellness Visit.  Cardiac Risk Factors include: male gender;hypertension;obesity (BMI >30kg/m2);diabetes mellitus;advanced age (>34men, >51 women)    Objective:    Today's Vitals   06/05/16 0923  BP: (!) 142/72  Pulse: 60  Resp: 14  Temp: 97.7 F (36.5 C)  TempSrc: Oral  SpO2: 97%  Weight: 219 lb (99.3 kg)  Height: 5\' 8"  (1.727 m)   Body mass index is 33.3 kg/m.  Current Medications (verified) Outpatient Encounter Prescriptions as of 06/05/2016  Medication Sig  . ACCU-CHEK AVIVA PLUS test strip USE TO TEST BLOOD SUGAR TWICE DAILY  . albuterol (PROVENTIL HFA;VENTOLIN HFA) 108 (90 Base) MCG/ACT inhaler Inhale 2 puffs into the lungs every 6 (six) hours as needed for wheezing or shortness of breath.  . ALPRAZolam (XANAX) 0.25 MG tablet TAKE 1 TABLET BY MOUTH ONCE DAILY AS NEEDED.  Marland Kitchen amLODipine (NORVASC) 10 MG tablet TAKE 1 TABLET BY MOUTH ONCE DAILY.  Marland Kitchen aspirin 81 MG tablet Take 81 mg by mouth daily.  Marland Kitchen atorvastatin (LIPITOR) 10 MG tablet TAKE 1 TABLET BY MOUTH ONCE DAILY.  . cetirizine (ZYRTEC) 10 MG tablet Take 10 mg by mouth daily.  . clopidogrel (PLAVIX) 75 MG tablet TAKE ONE TABLET BY MOUTH ONCE DAILY.  . fluticasone (FLONASE) 50 MCG/ACT nasal spray USE 2 SPRAYS IN EACH NOSTRIL ONCE DAILY.  Marland Kitchen glipiZIDE (GLUCOTROL XL) 2.5 MG 24 hr tablet TAKE 3 TABLETS BY MOUTH EVERY MORNING.  . hydrochlorothiazide (HYDRODIURIL) 25 MG tablet TAKE 1 TABLET BY MOUTH ONCE DAILY  . HYDROcodone-acetaminophen (NORCO) 5-325 MG tablet Take 1 tablet by mouth 2 (two) times daily as needed for moderate pain.  Marland Kitchen lisinopril (PRINIVIL,ZESTRIL) 40 MG tablet TAKE 1 TABLET BY MOUTH ONCE DAILY.  . metFORMIN (GLUCOPHAGE) 1000 MG tablet TAKE 1 TABLET BY MOUTH TWICE DAILY WITH MEALS  . metoprolol succinate (TOPROL-XL) 25 MG 24 hr tablet Take 1 tablet (25 mg  total) by mouth 2 (two) times daily.  . Multiple Vitamin (MULTIVITAMIN) tablet Take 1 tablet by mouth daily.  . Omega-3 Fatty Acids (FISH OIL) 1200 MG CAPS Take by mouth 4 (four) times daily.  . pantoprazole (PROTONIX) 40 MG tablet TAKE 1 TABLET BY MOUTH ONCE DAILY.  Marland Kitchen pioglitazone (ACTOS) 30 MG tablet TAKE ONE TABLET BY MOUTH ONCE DAILY  . predniSONE (DELTASONE) 10 MG tablet Take 6 tablets x 1 day and then decrease by 1/2 tablet per day until down to zero mg.  . sildenafil (REVATIO) 20 MG tablet Take 20 mg by mouth as needed.  . tamsulosin (FLOMAX) 0.4 MG CAPS capsule Take 0.4 mg by mouth daily with breakfast.   . testosterone cypionate (DEPOTESTOTERONE CYPIONATE) 200 MG/ML injection Inject 200 mg into the muscle every 14 (fourteen) days.  Marland Kitchen tiZANidine (ZANAFLEX) 2 MG tablet Take 2 mg by mouth 3 (three) times daily as needed.  . traMADol (ULTRAM) 50 MG tablet Take 1 tablet (50 mg total) by mouth 2 (two) times daily as needed.  . traZODone (DESYREL) 50 MG tablet TAKE 1.5 TABLETS BY MOUTH AT BEDTIME   Facility-Administered Encounter Medications as of 06/05/2016  Medication  . methylPREDNISolone acetate (DEPO-MEDROL) injection 40 mg    Allergies (verified) Penicillins   History: Past Medical History:  Diagnosis Date  . C. difficile colitis   . Depression   . Diabetes mellitus ([place])   . Diverticulosis   .  Environmental allergies   . GERD (gastroesophageal reflux disease)   . Goiter    intrathoracic, s/p benign biopsy (Dr Harlow Asa)  . Hypercholesterolemia   . Hypertension   . Osteoarthritis    cervical spine, lumbar spine  . Paroxysmal atrial fibrillation (H[facility])   . TIA (transient ischemic attack)    Past Surgical History:  Procedure Laterality Date  . BACK SURGERY  8/08   s/p fusion of L4-L5  . CATARACT EXTRACTION  2011   Dr. Herbert Deaner  . [place] SURGERY  2002  . eyelid reduction     Dr. Carlis Abbott  . INGUINAL HERNIA REPAIR  1991   Dr, Tamala Julian  . NOSE SURGERY     turbinate  reduction  . SEPTOPLASTY  1975  . SHOULDER SURGERY  2000   rotator cuff   Family History  Problem Relation Age of Onset  . Congestive Heart Failure Father   . Heart disease Father     myocardial infarction  . Rheumatic fever Father     valvular disease  . Heart disease Mother     s/p CABG (age 73)  . Kidney disease Sister   . Colon cancer Neg Hx   . Prostate cancer Neg Hx    Social History   Occupational History  . retired Pharmacist, hospital    Social History Main Topics  . Smoking status: Former Research scientist (life sciences)  . Smokeless tobacco: Never Used  . Alcohol use 0.0 oz/week     Comment: occasional  . Drug use: No  . Sexual activity: Not Currently   Tobacco Counseling Counseling given: Not Answered   Activities of Daily Living In your present state of health, do you have any difficulty performing the following activities: 06/05/2016 09/07/2015  Hearing? Tempie Donning  Vision? N N  Difficulty concentrating or making decisions? N N  Walking or climbing stairs? N N  Dressing or bathing? N N  Doing errands, shopping? N N  Preparing Food and eating ? N -  Using the Toilet? N -  In the past six months, have you accidently leaked urine? Y -  Do you have problems with loss of bowel control? Y -  Managing your Medications? N -  Managing your Finances? N -  Housekeeping or managing your Housekeeping? N -  Some recent data might be hidden    Immunizations and Health Maintenance Immunization History  Administered Date(s) Administered  . Influenza Split 02/16/2012, 02/13/2014  . Influenza, High Dose Seasonal PF 02/20/2016  . Influenza,inj,Quad PF,36+ Mos 01/14/2013, 03/08/2015  . Pneumococcal Conjugate-13 03/10/2013  . Pneumococcal Polysaccharide-23 08/23/2014  . Tdap 05/13/2011  . Zoster 12/08/2013   There are no preventive care reminders to display for this patient.  Patient Care Team: Einar Pheasant, MD as PCP - General (Internal Medicine)  Indicate any recent Medical Services you may have  received from other than [facility] providers in the past year (date may be approximate).    Assessment:   This is a routine wellness examination for Manning. The goal of the wellness visit is to assist the patient how to close the gaps in care and create a preventative care plan for the patient.   Osteoporosis risk reviewed.  Medications reviewed; taking without issues or barriers.  Safety issues reviewed; lives with wife.  Smoke detectors in the home. No firearms in the home. Wears seatbelts when driving or riding with others. No violence in the home.  No identified risk were noted; The patient was oriented x 3; appropriate in dress and  manner and no objective failures at ADL's or IADL's.   BMI; discussed the importance of a healthy diet, water intake and exercise. Educational material provided.  HTN; followed by PCP.  Health maintenance gaps; closed.  Patient Concerns: None at this time. Follow up with PCP as needed.  Hearing/Vision screen Hearing Screening Comments: Followed by [facility]  Dr. Tami Ribas Dr. [place] Hearing aids, bilateral Vision Screening Comments: Followed by Dr. Sondra Barges Last OV 04/2016 Dry eye (eye drops in use) Wears glasses Cataracts extracted, bilateral Visual acuity not assessed per patient preference since he has regular follow up with his ophthalmologist   Dietary issues and exercise activities discussed: Current Exercise Habits: Structured exercise class, Type of exercise: walking;stretching;calisthenics;strength training/weights, Frequency (Times/Week): 5, Intensity: Mild  Goals    . Maintain healthy lifestyle          Stay hydrated and drink plenty of fluids/water Stay active and continue exercise regimen Low carb foods.  Lean meats, vegetables.      Depression Screen PHQ 2/9 Scores 06/05/2016 09/07/2015 09/06/2014 07/14/2013  PHQ - 2 Score 0 2 1 0  PHQ- 9 Score - 8 - -    Fall Risk Fall Risk  06/05/2016 09/07/2015 09/06/2014  07/14/2013 05/11/2013  Falls in the past year? Yes No No Yes No  Number falls in past yr: 2 or more - - 1 -  Injury with Fall? No - - No -  Follow up Falls prevention discussed;Education provided - - - -    Cognitive Function: MMSE - Mini Mental State Exam 06/05/2016  Orientation to time 5  Orientation to Place 5  Registration 3  Attention/ Calculation 5  Recall 3  Language- name 2 objects 2  Language- repeat 1  Language- follow 3 step command 3  Language- read & follow direction 1  Write a sentence 1  Copy design 1  Total score 30        Screening Tests Health Maintenance  Topic Date Due  . HEMOGLOBIN A1C  08/07/2016  . FOOT EXAM  09/06/2016  . OPHTHALMOLOGY EXAM  02/19/2017  . TETANUS/TDAP  05/12/2021  . COLONOSCOPY  07/20/2021  . INFLUENZA VACCINE  Completed  . ZOSTAVAX  Completed  . Hepatitis C Screening  Completed  . PNA vac Low Risk Adult  Completed        Plan:    End of life planning; Advance aging; Advanced directives discussed. Copy of current HCPOA/Living Will requested.  Medicare Attestation I have personally reviewed: The patient's medical and social history Their use of alcohol, tobacco or illicit drugs Their current medications and supplements The patient's functional ability including ADLs,fall risks, home safety risks, cognitive, and hearing and visual impairment Diet and physical activities Evidence for depression   The patient's weight, height, BMI, and visual acuity have been recorded in the chart.  I have made referrals and provided education to the patient based on review of the above and I have provided the patient with a written personalized care plan for preventive services.    During the course of the visit Duwayne was educated and counseled about the following appropriate screening and preventive services:   Vaccines to include Pneumoccal, Influenza, Hepatitis B, Td, Zostavax, HCV  Electrocardiogram  Colorectal cancer  screening  Cardiovascular disease screening  Diabetes screening  Glaucoma screening  Nutrition counseling  Prostate cancer screening  Smoking cessation counseling  Patient Instructions (the written plan) were given to the patient.   Varney Biles, LPN   [id number]  Reviewed above information.  Agree with plan.  Dr Nicki Reaper

## 2016-06-05 NOTE — Patient Instructions (Addendum)
Ricardo Nash , Thank you for taking time to come for your Medicare Wellness Visit. I appreciate your ongoing commitment to your health goals. Please review the following plan we discussed and let me know if I can assist you in the future.   Follow up with Dr. Nicki Reaper as needed.  These are the goals we discussed: Goals    . Maintain healthy lifestyle          Stay hydrated and drink plenty of fluids/water Stay active and continue exercise regimen Low carb foods.  Lean meats, vegetables.       This is a list of the screening recommended for you and due dates:  Health Maintenance  Topic Date Due  . Hemoglobin A1C  08/07/2016  . Complete foot exam   09/06/2016  . Eye exam for diabetics  02/19/2017  . Tetanus Vaccine  05/12/2021  . Colon Cancer Screening  07/20/2021  . Flu Shot  Completed  . Shingles Vaccine  Completed  .  Hepatitis C: One time screening is recommended by Center for Disease Control  (CDC) for  adults born from [birth year] through 1966.   Completed  . Pneumonia vaccines  Completed      Fall Prevention in the Home Introduction Falls can cause injuries. They can happen to people of all ages. There are many things you can do to make your home safe and to help prevent falls. What can I do on the outside of my home?  Regularly fix the edges of walkways and driveways and fix any cracks.  Remove anything that might make you trip as you walk through a door, such as a raised step or threshold.  Trim any bushes or trees on the path to your home.  Use bright outdoor lighting.  Clear any walking paths of anything that might make someone trip, such as rocks or tools.  Regularly check to see if handrails are loose or broken. Make sure that both sides of any steps have handrails.  Any raised decks and porches should have guardrails on the edges.  Have any leaves, snow, or ice cleared regularly.  Use sand or salt on walking paths during winter.  Clean up any spills in your  garage right away. This includes oil or grease spills. What can I do in the bathroom?  Use night lights.  Install grab bars by the toilet and in the tub and shower. Do not use towel bars as grab bars.  Use non-skid mats or decals in the tub or shower.  If you need to sit down in the shower, use a plastic, non-slip stool.  Keep the floor dry. Clean up any water that spills on the floor as soon as it happens.  Remove soap buildup in the tub or shower regularly.  Attach bath mats securely with double-sided non-slip rug tape.  Do not have throw rugs and other things on the floor that can make you trip. What can I do in the bedroom?  Use night lights.  Make sure that you have a light by your bed that is easy to reach.  Do not use any sheets or blankets that are too big for your bed. They should not hang down onto the floor.  Have a firm chair that has side arms. You can use this for support while you get dressed.  Do not have throw rugs and other things on the floor that can make you trip. What can I do in the kitchen?  Clean up  any spills right away.  Avoid walking on wet floors.  Keep items that you use a lot in easy-to-reach places.  If you need to reach something above you, use a strong step stool that has a grab bar.  Keep electrical cords out of the way.  Do not use floor polish or wax that makes floors slippery. If you must use wax, use non-skid floor wax.  Do not have throw rugs and other things on the floor that can make you trip. What can I do with my stairs?  Do not leave any items on the stairs.  Make sure that there are handrails on both sides of the stairs and use them. Fix handrails that are broken or loose. Make sure that handrails are as long as the stairways.  Check any carpeting to make sure that it is firmly attached to the stairs. Fix any carpet that is loose or worn.  Avoid having throw rugs at the top or bottom of the stairs. If you do have throw  rugs, attach them to the floor with carpet tape.  Make sure that you have a light switch at the top of the stairs and the bottom of the stairs. If you do not have them, ask someone to add them for you. What else can I do to help prevent falls?  Wear shoes that:  Do not have high heels.  Have rubber bottoms.  Are comfortable and fit you well.  Are closed at the toe. Do not wear sandals.  If you use a stepladder:  Make sure that it is fully opened. Do not climb a closed stepladder.  Make sure that both sides of the stepladder are locked into place.  Ask someone to hold it for you, if possible.  Clearly mark and make sure that you can see:  Any grab bars or handrails.  First and last steps.  Where the edge of each step is.  Use tools that help you move around (mobility aids) if they are needed. These include:  Canes.  Walkers.  Scooters.  Crutches.  Turn on the lights when you go into a dark area. Replace any light bulbs as soon as they burn out.  Set up your furniture so you have a clear path. Avoid moving your furniture around.  If any of your floors are uneven, fix them.  If there are any pets around you, be aware of where they are.  Review your medicines with your doctor. Some medicines can make you feel dizzy. This can increase your chance of falling. Ask your doctor what other things that you can do to help prevent falls. This information is not intended to replace advice given to you by your health care provider. Make sure you discuss any questions you have with your health care provider. Document Released: 02/22/2009 Document Revised: 10/04/2015 Document Reviewed: 06/02/2014  2017 [employer]vier

## 2016-06-06 ENCOUNTER — Encounter: Payer: Self-pay | Admitting: Internal Medicine

## 2016-06-08 ENCOUNTER — Encounter: Payer: Self-pay | Admitting: Internal Medicine

## 2016-06-09 ENCOUNTER — Telehealth: Payer: Self-pay

## 2016-06-09 NOTE — Telephone Encounter (Signed)
Pt was seen on 05-27-16 for cough. He is still having sinus pain, productive cough, with brown color. He has finished all medication from that o/v with you yesterday. No fever, no nausea or vomiting. He is allergic to penicillin. Pt states that in the past [facility] had given Leviquin but he has to d/c some of his meds due interactions. He thinks that you suggested another medication but did not know the name.

## 2016-06-09 NOTE — Telephone Encounter (Signed)
lmtrc to office.   

## 2016-06-10 NOTE — Telephone Encounter (Signed)
Do you want me to see patient . Patient is willing to see other providers .  Please advise.

## 2016-06-10 NOTE — Telephone Encounter (Signed)
Pt called back returning your call. Thank you!  Call pt @ [phone]

## 2016-06-10 NOTE — Telephone Encounter (Signed)
If feels needs evaluation today or tomorrow, will need to work in with another provider and then I can f/u after.  Thanks

## 2016-06-10 NOTE — Telephone Encounter (Signed)
Left message to call on voicemail  

## 2016-06-10 NOTE — Telephone Encounter (Signed)
Appointment scheduled.

## 2016-06-10 NOTE — Telephone Encounter (Signed)
If persistent symptoms he needs to be reevaluated.

## 2016-06-11 ENCOUNTER — Encounter: Payer: Self-pay | Admitting: Family

## 2016-06-11 ENCOUNTER — Ambulatory Visit (INDEPENDENT_AMBULATORY_CARE_PROVIDER_SITE_OTHER): Payer: Medicare Other | Admitting: Family

## 2016-06-11 VITALS — BP 126/58 | HR 76 | Temp 97.9°F | Ht 68.0 in | Wt 222.0 lb

## 2016-06-11 DIAGNOSIS — J0101 Acute recurrent maxillary sinusitis: Secondary | ICD-10-CM | POA: Diagnosis not present

## 2016-06-11 MED ORDER — DOXYCYCLINE HYCLATE 100 MG PO TABS: 100.0000 mg | ORAL_TABLET | Freq: Two times a day (BID) | ORAL | 0 refills | Status: DC

## 2016-06-11 NOTE — Telephone Encounter (Signed)
Appointment scheduled.

## 2016-06-11 NOTE — Progress Notes (Signed)
Subjective:    Patient ID: Ricardo Nash, male    DOB: 1946/04/06, 71 y.o.   MRN: [id number]  CC: Ricardo Nash is a 71 y.o. male who presents today for follow up.   HPI: 2 weeks ago seen in clinic by PCP for wheezing. Chest x-ray was negative for pneumonia. Week prior had been seen as well , negative for flu, started on tamiflu.   Here today for continued congestion in 'head'. Deep congestion in chest is resolved. Notes teeth pain. Took prednisone  And been using albuterol inhaler with relief. On mucinex and robitussin with some relief. No fever, chills.     HISTORY:  Past Medical History:  Diagnosis Date  . C. difficile colitis   . Depression   . Diabetes mellitus ([place])   . Diverticulosis   . Environmental allergies   . GERD (gastroesophageal reflux disease)   . Goiter    intrathoracic, s/p benign biopsy (Dr Harlow Asa)  . Hypercholesterolemia   . Hypertension   . Osteoarthritis    cervical spine, lumbar spine  . Paroxysmal atrial fibrillation ([facility])   . TIA (transient ischemic attack)    Past Surgical History:  Procedure Laterality Date  . BACK SURGERY  8/08   s/p fusion of L4-L5  . CATARACT EXTRACTION  2011   Dr. Herbert Deaner  . [place] SURGERY  2002  . eyelid reduction     Dr. Carlis Abbott  . INGUINAL HERNIA REPAIR  1991   Dr, Tamala Julian  . NOSE SURGERY     turbinate reduction  . SEPTOPLASTY  1975  . SHOULDER SURGERY  2000   rotator cuff   Family History  Problem Relation Age of Onset  . Congestive Heart Failure Father   . Heart disease Father     myocardial infarction  . Rheumatic fever Father     valvular disease  . Heart disease Mother     s/p CABG (age 30)  . Kidney disease Sister   . Colon cancer Neg Hx   . Prostate cancer Neg Hx     Allergies: Penicillins Current Outpatient Prescriptions on File Prior to Visit  Medication Sig Dispense Refill  . ACCU-CHEK AVIVA PLUS test strip USE TO TEST BLOOD SUGAR TWICE DAILY 100 each 2  . albuterol (PROVENTIL  HFA;VENTOLIN HFA) 108 (90 Base) MCG/ACT inhaler Inhale 2 puffs into the lungs every 6 (six) hours as needed for wheezing or shortness of breath. 1 Inhaler 1  . ALPRAZolam (XANAX) 0.25 MG tablet TAKE 1 TABLET BY MOUTH ONCE DAILY AS NEEDED. 30 tablet 1  . amLODipine (NORVASC) 10 MG tablet TAKE 1 TABLET BY MOUTH ONCE DAILY. 90 tablet 2  . aspirin 81 MG tablet Take 81 mg by mouth daily.    Marland Kitchen atorvastatin (LIPITOR) 10 MG tablet TAKE 1 TABLET BY MOUTH ONCE DAILY. 90 tablet 1  . cetirizine (ZYRTEC) 10 MG tablet Take 10 mg by mouth daily.    . clopidogrel (PLAVIX) 75 MG tablet TAKE ONE TABLET BY MOUTH ONCE DAILY. 90 tablet 1  . fluticasone (FLONASE) 50 MCG/ACT nasal spray USE 2 SPRAYS IN EACH NOSTRIL ONCE DAILY. 16 g 5  . glipiZIDE (GLUCOTROL XL) 2.5 MG 24 hr tablet TAKE 3 TABLETS BY MOUTH EVERY MORNING. 270 tablet 1  . hydrochlorothiazide (HYDRODIURIL) 25 MG tablet TAKE 1 TABLET BY MOUTH ONCE DAILY 90 tablet 2  . HYDROcodone-acetaminophen (NORCO) 5-325 MG tablet Take 1 tablet by mouth 2 (two) times daily as needed for moderate pain. 10 tablet 0  .  lisinopril (PRINIVIL,ZESTRIL) 40 MG tablet TAKE 1 TABLET BY MOUTH ONCE DAILY. 90 tablet 3  . metFORMIN (GLUCOPHAGE) 1000 MG tablet TAKE 1 TABLET BY MOUTH TWICE DAILY WITH MEALS 180 tablet 1  . metoprolol succinate (TOPROL-XL) 25 MG 24 hr tablet Take 1 tablet (25 mg total) by mouth 2 (two) times daily. 180 tablet 3  . Multiple Vitamin (MULTIVITAMIN) tablet Take 1 tablet by mouth daily.    . Omega-3 Fatty Acids (FISH OIL) 1200 MG CAPS Take by mouth 4 (four) times daily.    . pantoprazole (PROTONIX) 40 MG tablet TAKE 1 TABLET BY MOUTH ONCE DAILY. 90 tablet 1  . pioglitazone (ACTOS) 30 MG tablet TAKE ONE TABLET BY MOUTH ONCE DAILY 90 tablet 1  . sildenafil (REVATIO) 20 MG tablet Take 20 mg by mouth as needed.    . tamsulosin (FLOMAX) 0.4 MG CAPS capsule Take 0.4 mg by mouth daily with breakfast.     . testosterone cypionate (DEPOTESTOTERONE CYPIONATE) 200 MG/ML  injection Inject 200 mg into the muscle every 14 (fourteen) days.    . traZODone (DESYREL) 50 MG tablet TAKE 1.5 TABLETS BY MOUTH AT BEDTIME 45 tablet 0   Current Facility-Administered Medications on File Prior to Visit  Medication Dose Route Frequency Provider Last Rate Last Dose  . methylPREDNISolone acetate (DEPO-MEDROL) injection 40 mg  40 mg Intramuscular Once Crecencio Mc, MD        Social History  Substance Use Topics  . Smoking status: Former Research scientist (life sciences)  . Smokeless tobacco: Never Used  . Alcohol use 0.0 oz/week     Comment: occasional    Review of Systems  Constitutional: Negative for chills and fever.  HENT: Positive for congestion and sinus pressure. Negative for ear pain and sore throat.   Respiratory: Negative for cough, shortness of breath and wheezing.   Cardiovascular: Negative for chest pain and palpitations.  Gastrointestinal: Negative for nausea and vomiting.      Objective:    BP (!) 126/58   Pulse 76   Temp 97.9 F (36.6 C) (Oral)   Ht 5\' 8"  (1.727 m)   Wt 222 lb (100.7 kg)   SpO2 96%   BMI 33.75 kg/m  BP Readings from Last 3 Encounters:  06/11/16 (!) 126/58  06/05/16 (!) 142/72  05/27/16 (!) 144/76   Wt Readings from Last 3 Encounters:  06/11/16 222 lb (100.7 kg)  06/05/16 219 lb (99.3 kg)  05/27/16 221 lb 3.2 oz (100.3 kg)    Physical Exam  Constitutional: Vital signs are normal. He appears well-developed and well-nourished.  HENT:  Head: Normocephalic and atraumatic.  Right Ear: Hearing, tympanic membrane, external ear and ear canal normal. No drainage, swelling or tenderness. Tympanic membrane is not injected, not erythematous and not bulging. No middle ear effusion. No decreased hearing is noted.  Left Ear: Hearing, tympanic membrane, external ear and ear canal normal. No drainage, swelling or tenderness. Tympanic membrane is not injected, not erythematous and not bulging.  No middle ear effusion. No decreased hearing is noted.  Nose: Right  sinus exhibits maxillary sinus tenderness. Right sinus exhibits no frontal sinus tenderness. Left sinus exhibits no maxillary sinus tenderness and no frontal sinus tenderness.  Mouth/Throat: Uvula is midline, oropharynx is clear and moist and mucous membranes are normal. No oropharyngeal exudate, posterior oropharyngeal edema, posterior oropharyngeal erythema or tonsillar abscesses.  Eyes: Conjunctivae are normal.  Cardiovascular: Regular rhythm and normal heart sounds.   Pulmonary/Chest: Effort normal and breath sounds normal. No respiratory distress. He  has no wheezes. He has no rhonchi. He has no rales.  Lymphadenopathy:       Head (right side): No submental, no submandibular, no tonsillar, no preauricular, no posterior auricular and no occipital adenopathy present.       Head (left side): No submental, no submandibular, no tonsillar, no preauricular, no posterior auricular and no occipital adenopathy present.    He has no cervical adenopathy.  Neurological: He is alert.  Skin: Skin is warm and dry.  Psychiatric: He has a normal mood and affect. His speech is normal and behavior is normal.  Vitals reviewed.      Assessment & Plan:   1. Acute recurrent maxillary sinusitis Afebrile. Advised patient to give 1-2 more days of symptom management prior to filling doxycycline. Patient agreed and will let me know if symptoms do not improve. - doxycycline (VIBRA-TABS) 100 MG tablet; Take 1 tablet (100 mg total) by mouth 2 (two) times daily.  Dispense: 14 tablet; Refill: 0   I have discontinued Mr. Dawson tiZANidine, traMADol, and predniSONE. I am also having him maintain his cetirizine, aspirin, multivitamin, Fish Oil, tamsulosin, testosterone cypionate, sildenafil, lisinopril, metoprolol succinate, fluticasone, clopidogrel, ACCU-CHEK AVIVA PLUS, glipiZIDE, HYDROcodone-acetaminophen, pantoprazole, amLODipine, hydrochlorothiazide, pioglitazone, atorvastatin, metFORMIN, traZODone, albuterol, and  ALPRAZolam. We will continue to administer methylPREDNISolone acetate.   No orders of the defined types were placed in this encounter.   Return precautions given.   Risks, benefits, and alternatives of the medications and treatment plan prescribed today were discussed, and patient expressed understanding.   Education regarding symptom management and diagnosis given to patient on AVS.  Continue to follow with Einar Pheasant, MD for routine health maintenance.   [facility] and I agreed with plan.   Mable Paris, FNP

## 2016-06-11 NOTE — Telephone Encounter (Signed)
Just received message. Pt was seen today 06/11/16

## 2016-06-11 NOTE — Patient Instructions (Signed)
It is likely that your infection is viral in nature.  As discussed, I advise that you wait to fill the antibiotic after 1-2 days of symptom management to see if your symptoms improve. If you do not show improvement, you may take the antibiotic as prescribed.  Please continue the  mucinex as well as below  Increase intake of clear fluids. Congestion is best treated by hydration, when mucus is wetter, it is thinner, less sticky, and easier to expel from the body, either through coughing up drainage, or by blowing your nose.   Get plenty of rest.   Use saline nasal drops and blow your nose frequently. Run a humidifier at night and elevate the head of the bed. Vicks Vapor rub will help with congestion and cough. Steam showers and sinus massage for congestion.   Use Acetaminophen or Ibuprofen as needed for fever or pain. Avoid second hand smoke. Even the smallest exposure will worsen symptoms.   This illness will typically last 7 - 10 days.   Please follow up with our clinic if you develop a fever greater than 101 F, symptoms worsen, or do not resolve in the next week.

## 2016-06-11 NOTE — Progress Notes (Signed)
Pre visit review using our clinic review tool, if applicable. No additional management support is needed unless otherwise documented below in the visit note. 

## 2016-07-08 ENCOUNTER — Telehealth: Payer: Self-pay | Admitting: *Deleted

## 2016-07-08 ENCOUNTER — Other Ambulatory Visit: Payer: Self-pay | Admitting: Internal Medicine

## 2016-07-08 MED ORDER — ONDANSETRON 4 MG PO TBDP: 4.0000 mg | ORAL_TABLET | Freq: Two times a day (BID) | ORAL | 0 refills | Status: DC | PRN

## 2016-07-08 NOTE — Telephone Encounter (Signed)
Pt currently has nausea and vomiting.Pt was around people this weekend with the stomach bug. Pt's wife requested a nausea medication called into the pharmacy. Pharmacy [facility]

## 2016-07-08 NOTE — Telephone Encounter (Addendum)
Reason for call: nausea and vomiting Symptoms: nausea , vomiting, diarrhea subsided, not able to keep liquids down, tried crushed ice, no fever,  Duration since last night  Would like medication for nausea called in to [facility], exposed to people with stomach bug. Went on cruise recently.   Please advise

## 2016-07-08 NOTE — Telephone Encounter (Signed)
Patient advised of below, able to keep some liquids down. Still urinating.   Advised if not able to keep liquids down will not to get evaluated as soon as possible.  Patient verbalized understanding.  Advised sent in script for zofran.

## 2016-07-08 NOTE — Telephone Encounter (Signed)
I sent in rx for zofran (for nausea) to [facility].  Needs to try to stay hydrated.  If persistent issues, will need to be seen.  Is diabetic.  Confirm pt not vomiting now.  Any acute change or symptoms - needs evaluation.

## 2016-07-09 ENCOUNTER — Other Ambulatory Visit: Payer: Self-pay | Admitting: Internal Medicine

## 2016-07-09 MED ORDER — FLUTICASONE PROPIONATE 50 MCG/ACT NA SUSP: 2.0000 | Freq: Every day | NASAL | 5 refills | Status: DC

## 2016-07-09 MED ORDER — GLIPIZIDE ER 2.5 MG PO TB24: 7.5000 mg | ORAL_TABLET | Freq: Every morning | ORAL | 1 refills | Status: DC

## 2016-07-09 NOTE — Telephone Encounter (Signed)
Pt called and stated that he is completely out of his glipiZIDE (GLUCOTROL XL) 2.5 MG 24 hr tablet. Please advise, thank you!  [place], [place].  Call pt @ [phone]

## 2016-07-09 NOTE — Telephone Encounter (Signed)
Refilled and sent. Pt was a pharmacy upset that rx's had not been sent.

## 2016-07-24 ENCOUNTER — Ambulatory Visit (INDEPENDENT_AMBULATORY_CARE_PROVIDER_SITE_OTHER): Payer: Medicare Other | Admitting: Family

## 2016-07-24 ENCOUNTER — Encounter: Payer: Self-pay | Admitting: Family

## 2016-07-24 VITALS — BP 160/60 | HR 64 | Temp 98.0°F | Resp 16 | Wt 223.2 lb

## 2016-07-24 DIAGNOSIS — J0101 Acute recurrent maxillary sinusitis: Secondary | ICD-10-CM | POA: Insufficient documentation

## 2016-07-24 DIAGNOSIS — I10 Essential (primary) hypertension: Secondary | ICD-10-CM | POA: Diagnosis not present

## 2016-07-24 MED ORDER — DOXYCYCLINE HYCLATE 100 MG PO TABS: 100.0000 mg | ORAL_TABLET | Freq: Two times a day (BID) | ORAL | 0 refills | Status: DC

## 2016-07-24 MED ORDER — HYDROCHLOROTHIAZIDE 25 MG PO TABS: 50.0000 mg | ORAL_TABLET | Freq: Every day | ORAL | 0 refills | Status: DC

## 2016-07-24 NOTE — Patient Instructions (Signed)
Start doxycycline. Continue Mucinex. Please monitor blood pressure. Goal < 140/90  May start taking two 25 tablets of hydrochlorothiazide for total of 50mg  once per day.   Check labs on Monday afternoon or Tuesday morning  Let us know if BP is not better controlled

## 2016-07-24 NOTE — Progress Notes (Signed)
Subjective:    Patient ID: Ricardo Nash, male    DOB: 1945-06-06, 71 y.o.   MRN: [id number]  CC: Ricardo Nash is a 71 y.o. male who presents today for an acute visit.    HPI: Chief complaint of sinus congestion x 6 days, unchanged. Sinus pressure, more on right side and teeth pain with eating. Also notes he had a fever over the weekend of 101, resolved.  Flu 2 months ago. Has been taking Mucinex D , vicks vapor rub, with little improvement. No cough, vision changes, HA.   Dental appt this week- no cavities.   HTN- BP averages 130/60 at home however feels elevated over the last couple of months. Denies exertional chest pain or pressure, numbness or tingling radiating to left arm or jaw, palpitations, dizziness, frequent headaches, changes in vision, or shortness of breath.       HISTORY:  Past Medical History:  Diagnosis Date  . C. difficile colitis   . Depression   . Diabetes mellitus ([place])   . Diverticulosis   . Environmental allergies   . GERD (gastroesophageal reflux disease)   . Goiter    intrathoracic, s/p benign biopsy (Dr Harlow Asa)  . Hypercholesterolemia   . Hypertension   . Osteoarthritis    cervical spine, lumbar spine  . Paroxysmal atrial fibrillation (HCC)   . TIA (transient ischemic attack)    Past Surgical History:  Procedure Laterality Date  . BACK SURGERY  8/08   s/p fusion of L4-L5  . CATARACT EXTRACTION  2011   Dr. Herbert Deaner  . [place] SURGERY  2002  . eyelid reduction     Dr. Carlis Abbott  . INGUINAL HERNIA REPAIR  1991   Dr, Tamala Julian  . NOSE SURGERY     turbinate reduction  . SEPTOPLASTY  1975  . SHOULDER SURGERY  2000   rotator cuff   Family History  Problem Relation Age of Onset  . Congestive Heart Failure Father   . Heart disease Father     myocardial infarction  . Rheumatic fever Father     valvular disease  . Heart disease Mother     s/p CABG (age 35)  . Kidney disease Sister   . Colon cancer Neg Hx   . Prostate cancer Neg Hx      Allergies: Penicillins Current Outpatient Prescriptions on File Prior to Visit  Medication Sig Dispense Refill  . ACCU-CHEK AVIVA PLUS test strip USE TO TEST BLOOD SUGAR TWICE DAILY 100 each 2  . albuterol (PROVENTIL HFA;VENTOLIN HFA) 108 (90 Base) MCG/ACT inhaler Inhale 2 puffs into the lungs every 6 (six) hours as needed for wheezing or shortness of breath. 1 Inhaler 1  . ALPRAZolam (XANAX) 0.25 MG tablet TAKE 1 TABLET BY MOUTH ONCE DAILY AS NEEDED. 30 tablet 1  . amLODipine (NORVASC) 10 MG tablet TAKE 1 TABLET BY MOUTH ONCE DAILY. 90 tablet 2  . aspirin 81 MG tablet Take 81 mg by mouth daily.    Marland Kitchen atorvastatin (LIPITOR) 10 MG tablet TAKE 1 TABLET BY MOUTH ONCE DAILY. 90 tablet 1  . cetirizine (ZYRTEC) 10 MG tablet Take 10 mg by mouth daily.    . clopidogrel (PLAVIX) 75 MG tablet TAKE ONE TABLET BY MOUTH ONCE DAILY. 90 tablet 1  . fluticasone (FLONASE) 50 MCG/ACT nasal spray Place 2 sprays into both nostrils daily. 16 g 5  . glipiZIDE (GLUCOTROL XL) 2.5 MG 24 hr tablet Take 3 tablets (7.5 mg total) by mouth every morning. Crucible  tablet 1  . HYDROcodone-acetaminophen (NORCO) 5-325 MG tablet Take 1 tablet by mouth 2 (two) times daily as needed for moderate pain. 10 tablet 0  . lisinopril (PRINIVIL,ZESTRIL) 40 MG tablet TAKE 1 TABLET BY MOUTH ONCE DAILY. 90 tablet 3  . metFORMIN (GLUCOPHAGE) 1000 MG tablet TAKE 1 TABLET BY MOUTH TWICE DAILY WITH MEALS 180 tablet 1  . metoprolol succinate (TOPROL-XL) 25 MG 24 hr tablet Take 1 tablet (25 mg total) by mouth 2 (two) times daily. 180 tablet 3  . Multiple Vitamin (MULTIVITAMIN) tablet Take 1 tablet by mouth daily.    . Omega-3 Fatty Acids (FISH OIL) 1200 MG CAPS Take by mouth 4 (four) times daily.    . ondansetron (ZOFRAN ODT) 4 MG disintegrating tablet Take 1 tablet (4 mg total) by mouth 2 (two) times daily as needed for nausea or vomiting. 15 tablet 0  . pantoprazole (PROTONIX) 40 MG tablet TAKE 1 TABLET BY MOUTH ONCE DAILY. 90 tablet 1  .  pioglitazone (ACTOS) 30 MG tablet TAKE ONE TABLET BY MOUTH ONCE DAILY 90 tablet 1  . sildenafil (REVATIO) 20 MG tablet Take 20 mg by mouth as needed.    . tamsulosin (FLOMAX) 0.4 MG CAPS capsule Take 0.4 mg by mouth daily with breakfast.     . testosterone cypionate (DEPOTESTOTERONE CYPIONATE) 200 MG/ML injection Inject 200 mg into the muscle every 14 (fourteen) days.    . traZODone (DESYREL) 50 MG tablet TAKE 1.5 TABLETS BY MOUTH AT BEDTIME 45 tablet 0   Current Facility-Administered Medications on File Prior to Visit  Medication Dose Route Frequency Provider Last Rate Last Dose  . methylPREDNISolone acetate (DEPO-MEDROL) injection 40 mg  40 mg Intramuscular Once Crecencio Mc, MD        Social History  Substance Use Topics  . Smoking status: Former Research scientist (life sciences)  . Smokeless tobacco: Never Used  . Alcohol use 0.0 oz/week     Comment: occasional    Review of Systems  Constitutional: Positive for fever. Negative for chills.  HENT: Positive for congestion. Negative for sinus pain, sinus pressure and sore throat.   Respiratory: Negative for cough.   Cardiovascular: Negative for chest pain and palpitations.  Gastrointestinal: Negative for nausea and vomiting.      Objective:    BP (!) 160/60   Pulse 64   Temp 98 F (36.7 C) (Oral)   Resp 16   Wt 223 lb 4 oz (101.3 kg)   SpO2 98%   BMI 33.95 kg/m   BP Readings from Last 3 Encounters:  07/24/16 (!) 160/60  06/11/16 (!) 126/58  06/05/16 (!) 142/72    Physical Exam  Constitutional: Vital signs are normal. He appears well-developed and well-nourished.  HENT:  Head: Normocephalic and atraumatic.  Right Ear: Hearing, tympanic membrane, external ear and ear canal normal. No drainage, swelling or tenderness. Tympanic membrane is not injected, not erythematous and not bulging. No middle ear effusion. No decreased hearing is noted.  Left Ear: Hearing, tympanic membrane, external ear and ear canal normal. No drainage, swelling or  tenderness. Tympanic membrane is not injected, not erythematous and not bulging.  No middle ear effusion. No decreased hearing is noted.  Nose: Right sinus exhibits maxillary sinus tenderness. Right sinus exhibits no frontal sinus tenderness. Left sinus exhibits maxillary sinus tenderness. Left sinus exhibits no frontal sinus tenderness.  Mouth/Throat: Uvula is midline, oropharynx is clear and moist and mucous membranes are normal. No oropharyngeal exudate, posterior oropharyngeal edema, posterior oropharyngeal erythema or tonsillar abscesses.  Eyes: Conjunctivae are normal.  Cardiovascular: Regular rhythm and normal heart sounds.   Pulmonary/Chest: Effort normal and breath sounds normal. No respiratory distress. He has no wheezes. He has no rhonchi. He has no rales.  Lymphadenopathy:       Head (right side): No submental, no submandibular, no tonsillar, no preauricular, no posterior auricular and no occipital adenopathy present.       Head (left side): No submental, no submandibular, no tonsillar, no preauricular, no posterior auricular and no occipital adenopathy present.    He has no cervical adenopathy.  Neurological: He is alert.  Skin: Skin is warm and dry.  Psychiatric: He has a normal mood and affect. His speech is normal and behavior is normal.  Vitals reviewed.      Assessment & Plan:   Problem List Items Addressed This Visit      Cardiovascular and Mediastinum   Hypertension - Primary    Elevated today. Patient has sinus infection however it was also elevated in January as well. When I retook the BP , it was actually higher. Discussed with patient and decided trial of increased HCTZ and we both thought reasonable. However it may be that blood pressure comes down and he can resume old dose. Last K 4.2. Repeat BMP next week.      Relevant Medications   hydrochlorothiazide (HYDRODIURIL) 25 MG tablet   Other Relevant Orders   Basic metabolic panel     Respiratory   Acute  recurrent maxillary sinusitis     Sinus tenderness, teeth pain. Patient and I jointly agreed to go ahead and start antibiotic. No fever today. He will let me know if not better.      Relevant Medications   pseudoephedrine-guaifenesin (MUCINEX D) 60-600 MG 12 hr tablet   doxycycline (VIBRA-TABS) 100 MG tablet         I have discontinued Mr. Sangha hydrochlorothiazide. I am also having him start on hydrochlorothiazide. Additionally, I am having him maintain his cetirizine, aspirin, multivitamin, Fish Oil, tamsulosin, testosterone cypionate, sildenafil, lisinopril, metoprolol succinate, clopidogrel, ACCU-CHEK AVIVA PLUS, HYDROcodone-acetaminophen, pantoprazole, amLODipine, pioglitazone, atorvastatin, metFORMIN, traZODone, albuterol, ALPRAZolam, ondansetron, glipiZIDE, fluticasone, pseudoephedrine-guaifenesin, and doxycycline. We will continue to administer methylPREDNISolone acetate.   Meds ordered this encounter  Medications  . pseudoephedrine-guaifenesin (MUCINEX D) 60-600 MG 12 hr tablet    Sig: Take 1 tablet by mouth every 12 (twelve) hours.  . hydrochlorothiazide (HYDRODIURIL) 25 MG tablet    Sig: Take 2 tablets (50 mg total) by mouth daily.    Dispense:  90 tablet    Refill:  0    Order Specific Question:   Supervising Provider    Answer:   Deborra Medina L [[id number]]  . doxycycline (VIBRA-TABS) 100 MG tablet    Sig: Take 1 tablet (100 mg total) by mouth 2 (two) times daily.    Dispense:  14 tablet    Refill:  0    Order Specific Question:   Supervising Provider    Answer:   Crecencio Mc [[id number]]    Return precautions given.   Risks, benefits, and alternatives of the medications and treatment plan prescribed today were discussed, and patient expressed understanding.   Education regarding symptom management and diagnosis given to patient on AVS.  Continue to follow with Einar Pheasant, MD for routine health maintenance.   [facility] and I agreed with plan.    Mable Paris, FNP

## 2016-07-24 NOTE — Assessment & Plan Note (Signed)
Sinus tenderness, teeth pain. Patient and I jointly agreed to go ahead and start antibiotic. No fever today. He will let me know if not better.

## 2016-07-24 NOTE — Assessment & Plan Note (Addendum)
Elevated today. Patient has sinus infection however it was also elevated in January as well. When I retook the BP , it was actually higher. Discussed with patient and decided trial of increased HCTZ and we both thought reasonable. However it may be that blood pressure comes down and he can resume old dose. Last K 4.2. Repeat BMP next week.

## 2016-07-24 NOTE — Progress Notes (Signed)
Pre visit review using our clinic review tool, if applicable. No additional management support is needed unless otherwise documented below in the visit note. 

## 2016-07-28 ENCOUNTER — Other Ambulatory Visit (INDEPENDENT_AMBULATORY_CARE_PROVIDER_SITE_OTHER): Payer: Medicare Other

## 2016-07-28 ENCOUNTER — Encounter: Payer: Self-pay | Admitting: Internal Medicine

## 2016-07-28 DIAGNOSIS — I10 Essential (primary) hypertension: Secondary | ICD-10-CM

## 2016-07-28 LAB — BASIC METABOLIC PANEL
BUN: 16 mg/dL (ref 6–23)
CALCIUM: 9.8 mg/dL (ref 8.4–10.5)
CO2: 27 meq/L (ref 19–32)
Chloride: 102 mEq/L (ref 96–112)
Creatinine, Ser: 0.76 mg/dL (ref 0.40–1.50)
GFR: 107.11 mL/min (ref >60.00)
Glucose, Bld: 130 mg/dL — ABNORMAL HIGH (ref 70–99)
POTASSIUM: 3.7 meq/L (ref 3.5–5.1)
SODIUM: 141 meq/L (ref 135–145)

## 2016-07-29 ENCOUNTER — Other Ambulatory Visit: Payer: Self-pay | Admitting: Family

## 2016-07-29 DIAGNOSIS — I10 Essential (primary) hypertension: Secondary | ICD-10-CM

## 2016-07-31 ENCOUNTER — Other Ambulatory Visit: Payer: Self-pay | Admitting: Internal Medicine

## 2016-08-04 ENCOUNTER — Other Ambulatory Visit: Payer: Self-pay | Admitting: Internal Medicine

## 2016-08-20 ENCOUNTER — Other Ambulatory Visit: Payer: Self-pay | Admitting: Internal Medicine

## 2016-09-04 ENCOUNTER — Ambulatory Visit (INDEPENDENT_AMBULATORY_CARE_PROVIDER_SITE_OTHER): Payer: Medicare Other | Admitting: Internal Medicine

## 2016-09-04 ENCOUNTER — Encounter: Payer: Self-pay | Admitting: Internal Medicine

## 2016-09-04 DIAGNOSIS — J329 Chronic sinusitis, unspecified: Secondary | ICD-10-CM

## 2016-09-04 DIAGNOSIS — E669 Obesity, unspecified: Secondary | ICD-10-CM

## 2016-09-04 DIAGNOSIS — E119 Type 2 diabetes mellitus without complications: Secondary | ICD-10-CM

## 2016-09-04 DIAGNOSIS — E7800 Pure hypercholesterolemia, unspecified: Secondary | ICD-10-CM | POA: Diagnosis not present

## 2016-09-04 DIAGNOSIS — H02403 Unspecified ptosis of bilateral eyelids: Secondary | ICD-10-CM | POA: Diagnosis not present

## 2016-09-04 DIAGNOSIS — K219 Gastro-esophageal reflux disease without esophagitis: Secondary | ICD-10-CM

## 2016-09-04 DIAGNOSIS — I10 Essential (primary) hypertension: Secondary | ICD-10-CM | POA: Diagnosis not present

## 2016-09-04 DIAGNOSIS — G4733 Obstructive sleep apnea (adult) (pediatric): Secondary | ICD-10-CM | POA: Diagnosis not present

## 2016-09-04 MED ORDER — ALPRAZOLAM 0.25 MG PO TABS: ORAL_TABLET | ORAL | 1 refills | Status: DC

## 2016-09-04 MED ORDER — AMLODIPINE BESYLATE 10 MG PO TABS: 10.0000 mg | ORAL_TABLET | Freq: Every day | ORAL | 2 refills | Status: DC

## 2016-09-04 MED ORDER — GLUCOSE BLOOD VI STRP: ORAL_STRIP | 2 refills | Status: DC

## 2016-09-04 NOTE — Progress Notes (Signed)
Patient ID: Ricardo Nash, male   DOB: 09-08-1945, 71 y.o.   MRN: [id number]   Subjective:    Patient ID: Ricardo Nash, male    DOB: [birth year], 71 y.o.   MRN: [id number]  HPI  Patient here for a scheduled follow up.  Has had issues with reoccurring sinus and allergy issues.  Treated for intermittent infections.  Is better.  Still with issues.  On a good regimen of antihistamine, nasal sprays, mucinex, etc. Has seen ENT.  Request referral to an allergist.  No chest pain.  Still exercising at the gym.  No sob.  No acid reflux.  No abdominal pain.  Bowels moving.  Some cramping on his left outer ankle.  No pain now.  Having problems with his eyelids drooping.  Affecting him seeing/reading.  Also gives him a headache.  Request referral to have evaluated.  Blood pressure was elevated.  Better now.  Only taking 51m of hctz.  Sugars leveling off now.  Discussed continued diet and exercise.     Past Medical History:  Diagnosis Date  . C. difficile colitis   . Depression   . [place]   . Diverticulosis   . Environmental allergies   . GERD (gastroesophageal reflux disease)   . Goiter    intrathoracic, s/p benign biopsy (Dr GHarlow Asa  . Hypercholesterolemia   . Hypertension   . Osteoarthritis    cervical spine, lumbar spine  . Paroxysmal atrial fibrillation ([facility])   . TIA (transient ischemic attack)    Past Surgical History:  Procedure Laterality Date  . BACK SURGERY  8/08   s/p fusion of L4-L5  . CATARACT EXTRACTION  2011   Dr. [place]  . eyel[place]     Dr. CCarlis Abbott . INGUINAL HERNIA REPAIR  1991   Dr, STamala Julian . NOSE SURGERY     turbinate reduction  . SEPTOPLASTY  1975  . SHOULDER SURGERY  2000   rotator cuff   Family History  Problem Relation Age of Onset  . Congestive Heart Failure Father   . Heart disease Father     myocardial infarction  . Rheumatic fever Father     valvular disease  . Heart disease Mother     s/p  CABG (age 656  . Kidney disease Sister   . Colon cancer Neg Hx   . Prostate cancer Neg Hx    Social History   Social History  . Marital status: Married    Spouse name: N/A  . Number of children: 2  . Years of education: N/A   Occupational History  . retired tPharmacist, hospital   Social History Main Topics  . Smoking status: Former SResearch scientist (life sciences) . Smokeless tobacco: Never Used  . Alcohol use 0.0 oz/week     Comment: occasional  . Drug use: No  . Sexual activity: Not Currently   Other Topics Concern  . None   Social History Narrative  . None    Outpatient Encounter Prescriptions as of 09/04/2016  Medication Sig  . ALPRAZolam (XANAX) 0.25 MG tablet TAKE 1 TABLET BY MOUTH ONCE DAILY AS NEEDED.  .Marland KitchenamLODipine (NORVASC) 10 MG tablet Take 1 tablet (10 mg total) by mouth daily.  .Marland Kitchenaspirin 81 MG tablet Take 81 mg by mouth daily.  .Marland Kitchenatorvastatin (LIPITOR) 10 MG tablet TAKE 1 TABLET BY MOUTH ONCE DAILY  . cetirizine (ZYRTEC) 10 MG tablet Take 10 mg by mouth daily.  .Marland Kitchen  clopidogrel (PLAVIX) 75 MG tablet TAKE ONE TABLET BY MOUTH ONCE DAILY  . fluticasone (FLONASE) 50 MCG/ACT nasal spray Place 2 sprays into both nostrils daily.  Marland Kitchen glipiZIDE (GLUCOTROL XL) 2.5 MG 24 hr tablet Take 3 tablets (7.5 mg total) by mouth every morning.  Marland Kitchen glucose blood (ACCU-CHEK AVIVA PLUS) test strip USE TO TEST BLOOD SUGAR TWICE DAILY  . hydrochlorothiazide (HYDRODIURIL) 25 MG tablet Take 2 tablets (50 mg total) by mouth daily.  Marland Kitchen lisinopril (PRINIVIL,ZESTRIL) 40 MG tablet TAKE 1 TABLET BY MOUTH ONCE DAILY.  . metFORMIN (GLUCOPHAGE) 1000 MG tablet TAKE 1 TABLET BY MOUTH TWICE DAILY WITH MEALS  . metoprolol succinate (TOPROL-XL) 25 MG 24 hr tablet Take 1 tablet (25 mg total) by mouth 2 (two) times daily.  . Multiple Vitamin (MULTIVITAMIN) tablet Take 1 tablet by mouth daily.  . Omega-3 Fatty Acids (FISH OIL) 1200 MG CAPS Take by mouth 4 (four) times daily.  . pantoprazole (PROTONIX) 40 MG tablet TAKE 1 TABLET BY MOUTH ONCE  DAILY.  Marland Kitchen pioglitazone (ACTOS) 30 MG tablet TAKE ONE TABLET BY MOUTH ONCE DAILY  . sildenafil (REVATIO) 20 MG tablet Take 20 mg by mouth as needed.  . tamsulosin (FLOMAX) 0.4 MG CAPS capsule Take 0.4 mg by mouth daily with breakfast.   . testosterone cypionate (DEPOTESTOTERONE CYPIONATE) 200 MG/ML injection Inject 200 mg into the muscle every 14 (fourteen) days.  . traZODone (DESYREL) 50 MG tablet TAKE 1 & 1/2 TABLETS BY MOUTH AT BEDTIME  . [DISCONTINUED] ACCU-CHEK AVIVA PLUS test strip USE TO TEST BLOOD SUGAR TWICE DAILY  . [DISCONTINUED] albuterol (PROVENTIL HFA;VENTOLIN HFA) 108 (90 Base) MCG/ACT inhaler Inhale 2 puffs into the lungs every 6 (six) hours as needed for wheezing or shortness of breath.  . [DISCONTINUED] ALPRAZolam (XANAX) 0.25 MG tablet TAKE 1 TABLET BY MOUTH ONCE DAILY AS NEEDED.  . [DISCONTINUED] amLODipine (NORVASC) 10 MG tablet TAKE 1 TABLET BY MOUTH ONCE DAILY.  . [DISCONTINUED] doxycycline (VIBRA-TABS) 100 MG tablet Take 1 tablet (100 mg total) by mouth 2 (two) times daily.  . [DISCONTINUED] HYDROcodone-acetaminophen (NORCO) 5-325 MG tablet Take 1 tablet by mouth 2 (two) times daily as needed for moderate pain.  . [DISCONTINUED] ondansetron (ZOFRAN ODT) 4 MG disintegrating tablet Take 1 tablet (4 mg total) by mouth 2 (two) times daily as needed for nausea or vomiting.  . [DISCONTINUED] pseudoephedrine-guaifenesin (MUCINEX D) 60-600 MG 12 hr tablet Take 1 tablet by mouth every 12 (twelve) hours.   Facility-Administered Encounter Medications as of 09/04/2016  Medication  . methylPREDNISolone acetate (DEPO-MEDROL) injection 40 mg    Review of Systems  Constitutional: Negative for appetite change and unexpected weight change.  HENT: Positive for congestion, postnasal drip and sinus pressure.   Respiratory: Negative for cough, chest tightness and shortness of breath.   Cardiovascular: Negative for chest pain, palpitations and leg swelling.  Gastrointestinal: Negative for  abdominal pain, diarrhea, nausea and vomiting.  Genitourinary: Negative for difficulty urinating and dysuria.  Musculoskeletal: Negative for joint swelling and myalgias.  Skin: Negative for color change and rash.  Neurological: Positive for headaches. Negative for dizziness and light-headedness.       Some intermittent headaches related to his eyelids as outlined.    Psychiatric/Behavioral: Negative for agitation and dysphoric mood.       Objective:    Physical Exam  Constitutional: He appears well-developed and well-nourished. No distress.  HENT:  Nose: Nose normal.  Mouth/Throat: Oropharynx is clear and moist.  Eyes: Conjunctivae are normal. Right eye  exhibits no discharge. Left eye exhibits no discharge.  Eyelid drooping - bilateral.    Neck: Neck supple. No thyromegaly present.  Cardiovascular: Normal rate and regular rhythm.   Pulmonary/Chest: Effort normal and breath sounds normal. No respiratory distress.  Abdominal: Soft. Bowel sounds are normal. There is no tenderness.  Musculoskeletal: He exhibits no edema or tenderness.  Lymphadenopathy:    He has no cervical adenopathy.  Skin: No rash noted. No erythema.  Psychiatric: He has a normal mood and affect. His behavior is normal.    BP 130/62 (BP Location: Left Arm, Patient Position: Sitting, Cuff Size: Large)   Pulse 87   Temp 98.6 F (37 C) (Oral)   Resp 12   Ht '5\' 8"'  (1.727 m)   Wt 221 lb 12.8 oz (100.6 kg)   SpO2 98%   BMI 33.72 kg/m  Wt Readings from Last 3 Encounters:  09/08/16 220 lb (99.8 kg)  09/06/16 220 lb (99.8 kg)  09/04/16 221 lb 12.8 oz (100.6 kg)     Lab Results  Component Value Date   WBC 8.0 02/08/2016   HGB 13.6 02/08/2016   HCT 40.1 02/08/2016   PLT 262.0 02/08/2016   GLUCOSE 130 (H) 07/28/2016   CHOL 183 06/05/2016   TRIG 332.0 (H) 06/05/2016   HDL 48.70 06/05/2016   LDLDIRECT 105.0 06/05/2016   LDLCALC 58 11/21/2013   ALT 29 06/05/2016   AST 18 06/05/2016   NA 141 07/28/2016    K 3.7 07/28/2016   CL 102 07/28/2016   CREATININE 0.76 07/28/2016   BUN 16 07/28/2016   CO2 27 07/28/2016   TSH 0.45 06/05/2016   PSA 0.4 08/11/2014   HGBA1C 7.2 (H) 06/05/2016   MICROALBUR 9.1 (H) 10/02/2015    Mr Cervical Spine Wo Contrast  Result Date: 03/28/2016 CLINICAL DATA:  Cervical radiculitis. Neck pain and stiffness for 2-3 months. Bilateral shoulder pain. Prior cervical spine fusion. EXAM: MRI CERVICAL SPINE WITHOUT CONTRAST TECHNIQUE: Multiplanar, multisequence MR imaging of the cervical spine was performed. No intravenous contrast was administered. COMPARISON:  Cervical spine radiographs 09/10/2015. Chest CT 07/31/2010. FINDINGS: Alignment: Normal. Vertebrae: Preserved vertebral body height without evidence of fracture. Prior C5-6 ACDF with solid osseous fusion. No osseous lesion or significant marrow edema. Cord: Focal T2 hyperintensity in the spinal cord at C5-6 with slight volume loss consistent with myelomalacia and likely related to prior stenosis. Posterior Fossa, vertebral arteries, paraspinal tissues: Partially visualized thyroid goiter with left greater than right lobe enlargement as seen on prior CT, incompletely evaluated. Disc levels: C2-3:  Small right central disc protrusion without stenosis. C3-4: Disc bulging, right uncovertebral spurring, and infolding of the ligamentum flavum result in mild right neural foraminal stenosis and mild spinal stenosis. C4-5: Minimal disc space height loss. Disc bulging, right paracentral disc protrusion, infolding of the ligamentum flavum, right greater left uncovertebral spurring, and mild left facet arthrosis result in severe spinal stenosis with mild cord flattening and severe bilateral neural foraminal stenosis. C5-6:  Prior ACDF.  No significant residual stenosis. C6-7: Disc bulging and right paracentral disc protrusion result in mild spinal stenosis and mild to moderate right and mild left neural foraminal stenosis. C7-T1: Small central  disc extrusion results in mild spinal stenosis with a slight impression on the ventral spinal cord. Mild right uncovertebral spurring and minimal facet arthrosis result in mild right neural foraminal stenosis. IMPRESSION: 1. Solid C5-6 ACDF without residual stenosis. 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 2. 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 3. Mild spinal stenosis at C3-4 and C6-7. Electronically Signed   By: Logan Bores M.D.   On: 03/28/2016 16:22       Assessment & Plan:   Problem List Items Addressed This Visit    Diabetes mellitus ([place])    Low carb diet and exercise.  Follow met b and a1c.  Sugars leveling off.        Relevant Orders   Hemoglobin L4D   Basic metabolic panel   Microalbumin / creatinine urine ratio   Drooping eyelid    Bilateral.  Causing him to have trouble reading and contributes to headaches intermittently.  Will refer to ENT - Dr Nadeen Landau.        Relevant Orders   Ambulatory referral to ENT   Frequent sinus infections    Persistent problems with allergies and reoccurring sinus infections.  Continue antihistamine and nasal sprays as he is doing.  Takes mucinex prn.  Refer to an allergist.  Follow.       Relevant Orders   Ambulatory referral to Allergy   GERD (gastroesophageal reflux disease)    On protonix.  Controlled.        Hypercholesterolemia    On lipitor.  Follow lipid panel and liver function tests.        Relevant Medications   amLODipine (NORVASC) 10 MG tablet   Other Relevant Orders   Hepatic function panel   Lipid panel   Hypertension    Blood pressure under good control.  Continue same medication regimen.  Follow pressures.  Follow metabolic panel.        Relevant Medications   amLODipine (NORVASC) 10 MG tablet   Obesity (BMI 30-39.9)    Diet and exercise.  Follow.        Obstructive sleep apnea    CPAP.           Einar Pheasant, MD

## 2016-09-04 NOTE — Progress Notes (Signed)
Pre-visit discussion using our clinic review tool. No additional management support is needed unless otherwise documented below in the visit note.  

## 2016-09-06 ENCOUNTER — Emergency Department
Admission: EM | Admit: 2016-09-06 | Discharge: 2016-09-06 | Disposition: A | Discharge status: Home / Self Care | Payer: Medicare Other | Attending: Emergency Medicine | Admitting: Emergency Medicine

## 2016-09-06 ENCOUNTER — Encounter: Payer: Self-pay | Admitting: Emergency Medicine

## 2016-09-06 DIAGNOSIS — S81812A Laceration without foreign body, left lower leg, initial encounter: Secondary | ICD-10-CM | POA: Diagnosis present

## 2016-09-06 DIAGNOSIS — Z7984 Long term (current) use of oral hypoglycemic drugs: Secondary | ICD-10-CM | POA: Diagnosis not present

## 2016-09-06 DIAGNOSIS — Z87891 Personal history of nicotine dependence: Secondary | ICD-10-CM | POA: Insufficient documentation

## 2016-09-06 DIAGNOSIS — Z7982 Long term (current) use of aspirin: Secondary | ICD-10-CM | POA: Insufficient documentation

## 2016-09-06 DIAGNOSIS — E119 Type 2 diabetes mellitus without complications: Secondary | ICD-10-CM | POA: Insufficient documentation

## 2016-09-06 DIAGNOSIS — Y999 Unspecified external cause status: Secondary | ICD-10-CM | POA: Insufficient documentation

## 2016-09-06 DIAGNOSIS — Y929 Unspecified place or not applicable: Secondary | ICD-10-CM | POA: Insufficient documentation

## 2016-09-06 DIAGNOSIS — I10 Essential (primary) hypertension: Secondary | ICD-10-CM | POA: Insufficient documentation

## 2016-09-06 DIAGNOSIS — Y939 Activity, unspecified: Secondary | ICD-10-CM | POA: Diagnosis not present

## 2016-09-06 DIAGNOSIS — X58XXXA Exposure to other specified factors, initial encounter: Secondary | ICD-10-CM | POA: Insufficient documentation

## 2016-09-06 NOTE — ED Provider Notes (Signed)
[facility] Emergency Department Provider Note   ____________________________________________   First MD Initiated Contact with Patient 09/06/16 1132     (approximate)  I have reviewed the triage vital signs and the nursing notes.   HISTORY  Chief Complaint Laceration    HPI Ricardo Nash is a 71 y.o. male patient's present with 0.5 cm laceration secondary to a puncture wound to the left medial thigh. Instead occurred approximately 18 hours ago. Patient state unable to control bleeding which she described as "woozy" with pressure dressing.Patient takes Plavix and aspirin. Patient denies pain with this complaint. No other palliative measures until the patient arrived in triage it with Surgicel was applied.   Past Medical History:  Diagnosis Date  . C. difficile colitis   . Depression   . Diabetes mellitus ([place])   . Diverticulosis   . Environmental allergies   . GERD (gastroesophageal reflux disease)   . Goiter    intrathoracic, s/p benign biopsy (Dr Harlow Asa)  . Hypercholesterolemia   . Hypertension   . Osteoarthritis    cervical spine, lumbar spine  . Paroxysmal atrial fibrillation (HCC)   . TIA (transient ischemic attack)     Patient Active Problem List   Diagnosis Date Noted  . Acute recurrent maxillary sinusitis 07/24/2016  . Wheezing 05/29/2016  . Influenza-like illness 05/22/2016  . Fatigue 09/09/2015  . Lesion of soft tissue of lower leg and ankle 04/07/2015  . Anemia, iron deficiency 08/28/2014  . Health care maintenance 06/25/2014  . Obesity (BMI 30-39.9) 04/17/2014  . Difficulty sleeping 04/17/2014  . Back pain 11/21/2013  . Obstructive sleep apnea 07/19/2012  . Diabetes mellitus ([place]) 04/02/2012  . Hypertension 04/02/2012  . Hypercholesterolemia 04/02/2012  . GERD (gastroesophageal reflux disease) 04/02/2012  . Intrathoracic goiter 04/02/2012  . Osteoarthritis 04/02/2012  . TIA (transient ischemic attack) 04/02/2012     Past Surgical History:  Procedure Laterality Date  . BACK SURGERY  8/08   s/p fusion of L4-L5  . CATARACT EXTRACTION  2011   Dr. Herbert Deaner  . [place] SURGERY  2002  . eyelid reduction     Dr. Carlis Abbott  . INGUINAL HERNIA REPAIR  1991   Dr, Tamala Julian  . NOSE SURGERY     turbinate reduction  . SEPTOPLASTY  1975  . SHOULDER SURGERY  2000   rotator cuff    Prior to Admission medications   Medication Sig Start Date End Date Taking? Authorizing Provider  ALPRAZolam (XANAX) 0.25 MG tablet TAKE 1 TABLET BY MOUTH ONCE DAILY AS NEEDED. 09/04/16   Einar Pheasant, MD  amLODipine (NORVASC) 10 MG tablet Take 1 tablet (10 mg total) by mouth daily. 09/04/16   Einar Pheasant, MD  aspirin 81 MG tablet Take 81 mg by mouth daily.    Historical Provider, MD  atorvastatin (LIPITOR) 10 MG tablet TAKE 1 TABLET BY MOUTH ONCE DAILY 08/01/16   Einar Pheasant, MD  cetirizine (ZYRTEC) 10 MG tablet Take 10 mg by mouth daily.    Historical Provider, MD  clopidogrel (PLAVIX) 75 MG tablet TAKE ONE TABLET BY MOUTH ONCE DAILY 08/05/16   Einar Pheasant, MD  fluticasone (FLONASE) 50 MCG/ACT nasal spray Place 2 sprays into both nostrils daily. 07/09/16   Einar Pheasant, MD  glipiZIDE (GLUCOTROL XL) 2.5 MG 24 hr tablet Take 3 tablets (7.5 mg total) by mouth every morning. 07/09/16   Einar Pheasant, MD  glucose blood (ACCU-CHEK AVIVA PLUS) test strip USE TO TEST BLOOD SUGAR TWICE DAILY 09/04/16  Einar Pheasant, MD  hydrochlorothiazide (HYDRODIURIL) 25 MG tablet Take 2 tablets (50 mg total) by mouth daily. 07/24/16   Burnard Hawthorne, FNP  lisinopril (PRINIVIL,ZESTRIL) 40 MG tablet TAKE 1 TABLET BY MOUTH ONCE DAILY. 08/01/16   Einar Pheasant, MD  metFORMIN (GLUCOPHAGE) 1000 MG tablet TAKE 1 TABLET BY MOUTH TWICE DAILY WITH MEALS 05/13/16   Einar Pheasant, MD  metoprolol succinate (TOPROL-XL) 25 MG 24 hr tablet Take 1 tablet (25 mg total) by mouth 2 (two) times daily. 09/07/15   Einar Pheasant, MD  Multiple Vitamin (MULTIVITAMIN)  tablet Take 1 tablet by mouth daily.    Historical Provider, MD  Omega-3 Fatty Acids (FISH OIL) 1200 MG CAPS Take by mouth 4 (four) times daily.    Historical Provider, MD  pantoprazole (PROTONIX) 40 MG tablet TAKE 1 TABLET BY MOUTH ONCE DAILY. 03/21/16   Einar Pheasant, MD  pioglitazone (ACTOS) 30 MG tablet TAKE ONE TABLET BY MOUTH ONCE DAILY 03/31/16   Einar Pheasant, MD  sildenafil (REVATIO) 20 MG tablet Take 20 mg by mouth as needed.    Historical Provider, MD  tamsulosin (FLOMAX) 0.4 MG CAPS capsule Take 0.4 mg by mouth daily with breakfast.  03/24/14   Historical Provider, MD  testosterone cypionate (DEPOTESTOTERONE CYPIONATE) 200 MG/ML injection Inject 200 mg into the muscle every 14 (fourteen) days. 08/17/14   Historical Provider, MD  traZODone (DESYREL) 50 MG tablet TAKE 1 & 1/2 TABLETS BY MOUTH AT BEDTIME 08/20/16   Einar Pheasant, MD    Allergies Penicillins  Family History  Problem Relation Age of Onset  . Congestive Heart Failure Father   . Heart disease Father     myocardial infarction  . Rheumatic fever Father     valvular disease  . Heart disease Mother     s/p CABG (age 30)  . Kidney disease Sister   . Colon cancer Neg Hx   . Prostate cancer Neg Hx     Social History Social History  Substance Use Topics  . Smoking status: Former Research scientist (life sciences)  . Smokeless tobacco: Never Used  . Alcohol use 0.0 oz/week     Comment: occasional    Review of Systems  Constitutional: No fever/chills Eyes: No visual changes. ENT: No sore throat. Cardiovascular: Denies chest pain. Respiratory: Denies shortness of breath. Gastrointestinal: No abdominal pain.  No nausea, no vomiting.  No diarrhea.  No constipation. Genitourinary: Negative for dysuria. Musculoskeletal: Negative for back pain. Skin: Negative for rash. Neurological: Negative for headaches, focal weakness or numbness. Psychiatric:Depression. Endocrine:Diabetes, hypertension, and hyperlipidemia. Allergic/Immunilogical:  Penicillin  ____________________________________________   PHYSICAL EXAM:  VITAL SIGNS: ED Triage Vitals  Enc Vitals Group     BP 09/06/16 1020 (!) 150/47     Pulse Rate 09/06/16 1020 66     Resp 09/06/16 1020 20     Temp 09/06/16 1020 98.7 F (37.1 C)     Temp Source 09/06/16 1020 Oral     SpO2 09/06/16 1020 96 %     Weight 09/06/16 1021 220 lb (99.8 kg)     Height 09/06/16 1021 5\' 9"  (1.753 m)     Head Circumference --      Peak Flow --      Pain Score --      Pain Loc --      Pain Edu? --      Excl. in [place]? --     Constitutional: Alert and oriented. Well appearing and in no acute distress. Eyes: Conjunctivae are normal. PERRL. EOMI.  Head: Atraumatic. Nose: No congestion/rhinnorhea. Mouth/Throat: Mucous membranes are moist.  Oropharynx non-erythematous. Neck: No stridor.  No cervical spine tenderness to palpation. Hematological/Lymphatic/Immunilogical: No cervical lymphadenopathy. Cardiovascular: Normal rate, regular rhythm. Grossly normal heart sounds.  Good peripheral circulation. Respiratory: Normal respiratory effort.  No retractions. Lungs CTAB. Gastrointestinal: Soft and nontender. No distention. No abdominal bruits. No CVA tenderness. Musculoskeletal: No lower extremity tenderness nor edema.  No joint effusions. Neurologic:  Normal speech and language. No gross focal neurologic deficits are appreciated. No gait instability. Skin:  Skin is warm, dry and intact. No rash noted. 0.5 vertical laceration medial left thigh. Mild hemorrhaging. Psychiatric: Mood and affect are normal. Speech and behavior are normal.  ____________________________________________   LABS (all labs ordered are listed, but only abnormal results are displayed)  Labs Reviewed - No data to display ____________________________________________  EKG   ____________________________________________  RADIOLOGY   ____________________________________________   PROCEDURES  Procedure(s)  performed: None  Procedures  Critical Care performed: No  ____________________________________________   INITIAL IMPRESSION / ASSESSMENT AND PLAN / ED COURSE  Pertinent labs & imaging results that were available during my care of the patient were reviewed by me and considered in my medical decision making (see chart for details).  Minor laceration left medial thigh. Wound is complicated secondary to hemorrhaging due to blood thinner. Areas reclean and Surgicel applied to the wound. Approximately 3-5 minutes with direct pressure hemostasis was attained. Patient was placed in a pressure dressing and advised to follow-up in the morning for wound check.      ____________________________________________   FINAL CLINICAL IMPRESSION(S) / ED DIAGNOSES  Final diagnoses:  Laceration of left lower extremity, initial encounter      NEW MEDICATIONS STARTED DURING THIS VISIT:  New Prescriptions   No medications on file     Note:  This document was prepared using Dragon voice recognition software and may include unintentional dictation errors.    Sable Feil, PA-C 09/06/16 1152    Lisa Roca, MD 09/06/16 [phone]

## 2016-09-06 NOTE — ED Triage Notes (Signed)
Patient arrives to [facility] with c/o laceration. Patient presents with 1/2" laceration vs puncture to the left medial, distal, upper leg since yesterday. Bleeding since yesterday, small oozing noted

## 2016-09-06 NOTE — Discharge Instructions (Signed)
Advised to return in the morning for wound check and dressing change.

## 2016-09-06 NOTE — ED Notes (Signed)
Pt accidentally slipped and stabbed himself in LLE. Pt has a wound to his medial thigh on the left. Pt stating that he cleaned it out and bandaged it but that it has not stopped bleeding. Pt's wound is actively bleeding. Jori Moll, PA is at bedside. Pt stating that he is on blood thinners and a baby ASA.

## 2016-09-06 NOTE — ED Notes (Signed)
Surgicel and pressure bandage applied to site

## 2016-09-08 ENCOUNTER — Encounter: Payer: Self-pay | Admitting: Emergency Medicine

## 2016-09-08 ENCOUNTER — Emergency Department
Admission: EM | Admit: 2016-09-08 | Discharge: 2016-09-08 | Disposition: A | Discharge status: Home / Self Care | Payer: Medicare Other | Attending: Emergency Medicine | Admitting: Emergency Medicine

## 2016-09-08 DIAGNOSIS — Z5189 Encounter for other specified aftercare: Secondary | ICD-10-CM

## 2016-09-08 DIAGNOSIS — Z4801 Encounter for change or removal of surgical wound dressing: Secondary | ICD-10-CM | POA: Insufficient documentation

## 2016-09-08 DIAGNOSIS — Z7984 Long term (current) use of oral hypoglycemic drugs: Secondary | ICD-10-CM | POA: Insufficient documentation

## 2016-09-08 DIAGNOSIS — Z7982 Long term (current) use of aspirin: Secondary | ICD-10-CM | POA: Insufficient documentation

## 2016-09-08 DIAGNOSIS — E785 Hyperlipidemia, unspecified: Secondary | ICD-10-CM | POA: Insufficient documentation

## 2016-09-08 DIAGNOSIS — I10 Essential (primary) hypertension: Secondary | ICD-10-CM | POA: Diagnosis not present

## 2016-09-08 DIAGNOSIS — Z87891 Personal history of nicotine dependence: Secondary | ICD-10-CM | POA: Insufficient documentation

## 2016-09-08 DIAGNOSIS — E119 Type 2 diabetes mellitus without complications: Secondary | ICD-10-CM | POA: Insufficient documentation

## 2016-09-08 NOTE — ED Provider Notes (Signed)
[facility] Emergency Department Provider Note   ____________________________________________   First MD Initiated Contact with Patient 09/08/16 1018     (approximate)  I have reviewed the triage vital signs and the nursing notes.   HISTORY  Chief Complaint Wound Check    HPI Ricardo Nash is a 71 y.o. male patient follow up secondary to laceration medial left thigh. Patient wound complicated secondary to Coumadin and aspirin.Patient was treated using Surgicel and compression dressing. Patient states when his hemoglobin and no complication secondary to bleeding. Denies pain at this time.  Past Medical History:  Diagnosis Date  . C. difficile colitis   . Depression   . Diabetes mellitus ([place])   . Diverticulosis   . Environmental allergies   . GERD (gastroesophageal reflux disease)   . Goiter    intrathoracic, s/p benign biopsy (Dr Harlow Asa)  . Hypercholesterolemia   . Hypertension   . Osteoarthritis    cervical spine, lumbar spine  . Paroxysmal atrial fibrillation (HCC)   . TIA (transient ischemic attack)     Patient Active Problem List   Diagnosis Date Noted  . Acute recurrent maxillary sinusitis 07/24/2016  . Wheezing 05/29/2016  . Influenza-like illness 05/22/2016  . Fatigue 09/09/2015  . Lesion of soft tissue of lower leg and ankle 04/07/2015  . Anemia, iron deficiency 08/28/2014  . Health care maintenance 06/25/2014  . Obesity (BMI 30-39.9) 04/17/2014  . Difficulty sleeping 04/17/2014  . Back pain 11/21/2013  . Obstructive sleep apnea 07/19/2012  . Diabetes mellitus ([place]) 04/02/2012  . Hypertension 04/02/2012  . Hypercholesterolemia 04/02/2012  . GERD (gastroesophageal reflux disease) 04/02/2012  . Intrathoracic goiter 04/02/2012  . Osteoarthritis 04/02/2012  . TIA (transient ischemic attack) 04/02/2012    Past Surgical History:  Procedure Laterality Date  . BACK SURGERY  8/08   s/p fusion of L4-L5  . CATARACT  EXTRACTION  2011   Dr. Herbert Deaner  . [place] SURGERY  2002  . eyelid reduction     Dr. Carlis Abbott  . INGUINAL HERNIA REPAIR  1991   Dr, Tamala Julian  . NOSE SURGERY     turbinate reduction  . SEPTOPLASTY  1975  . SHOULDER SURGERY  2000   rotator cuff    Prior to Admission medications   Medication Sig Start Date End Date Taking? Authorizing Provider  ALPRAZolam (XANAX) 0.25 MG tablet TAKE 1 TABLET BY MOUTH ONCE DAILY AS NEEDED. 09/04/16   Einar Pheasant, MD  amLODipine (NORVASC) 10 MG tablet Take 1 tablet (10 mg total) by mouth daily. 09/04/16   Einar Pheasant, MD  aspirin 81 MG tablet Take 81 mg by mouth daily.    Historical Provider, MD  atorvastatin (LIPITOR) 10 MG tablet TAKE 1 TABLET BY MOUTH ONCE DAILY 08/01/16   Einar Pheasant, MD  cetirizine (ZYRTEC) 10 MG tablet Take 10 mg by mouth daily.    Historical Provider, MD  clopidogrel (PLAVIX) 75 MG tablet TAKE ONE TABLET BY MOUTH ONCE DAILY 08/05/16   Einar Pheasant, MD  fluticasone (FLONASE) 50 MCG/ACT nasal spray Place 2 sprays into both nostrils daily. 07/09/16   Einar Pheasant, MD  glipiZIDE (GLUCOTROL XL) 2.5 MG 24 hr tablet Take 3 tablets (7.5 mg total) by mouth every morning. 07/09/16   Einar Pheasant, MD  glucose blood (ACCU-CHEK AVIVA PLUS) test strip USE TO TEST BLOOD SUGAR TWICE DAILY 09/04/16   Einar Pheasant, MD  hydrochlorothiazide (HYDRODIURIL) 25 MG tablet Take 2 tablets (50 mg total) by mouth daily. 07/24/16  Burnard Hawthorne, FNP  lisinopril (PRINIVIL,ZESTRIL) 40 MG tablet TAKE 1 TABLET BY MOUTH ONCE DAILY. 08/01/16   Einar Pheasant, MD  metFORMIN (GLUCOPHAGE) 1000 MG tablet TAKE 1 TABLET BY MOUTH TWICE DAILY WITH MEALS 05/13/16   Einar Pheasant, MD  metoprolol succinate (TOPROL-XL) 25 MG 24 hr tablet Take 1 tablet (25 mg total) by mouth 2 (two) times daily. 09/07/15   Einar Pheasant, MD  Multiple Vitamin (MULTIVITAMIN) tablet Take 1 tablet by mouth daily.    Historical Provider, MD  Omega-3 Fatty Acids (FISH OIL) 1200 MG CAPS Take by  mouth 4 (four) times daily.    Historical Provider, MD  pantoprazole (PROTONIX) 40 MG tablet TAKE 1 TABLET BY MOUTH ONCE DAILY. 03/21/16   Einar Pheasant, MD  pioglitazone (ACTOS) 30 MG tablet TAKE ONE TABLET BY MOUTH ONCE DAILY 03/31/16   Einar Pheasant, MD  sildenafil (REVATIO) 20 MG tablet Take 20 mg by mouth as needed.    Historical Provider, MD  tamsulosin (FLOMAX) 0.4 MG CAPS capsule Take 0.4 mg by mouth daily with breakfast.  03/24/14   Historical Provider, MD  testosterone cypionate (DEPOTESTOTERONE CYPIONATE) 200 MG/ML injection Inject 200 mg into the muscle every 14 (fourteen) days. 08/17/14   Historical Provider, MD  traZODone (DESYREL) 50 MG tablet TAKE 1 & 1/2 TABLETS BY MOUTH AT BEDTIME 08/20/16   Einar Pheasant, MD    Allergies Penicillins  Family History  Problem Relation Age of Onset  . Congestive Heart Failure Father   . Heart disease Father     myocardial infarction  . Rheumatic fever Father     valvular disease  . Heart disease Mother     s/p CABG (age 34)  . Kidney disease Sister   . Colon cancer Neg Hx   . Prostate cancer Neg Hx     Social History Social History  Substance Use Topics  . Smoking status: Former Research scientist (life sciences)  . Smokeless tobacco: Never Used  . Alcohol use 0.0 oz/week     Comment: occasional    Review of Systems  Constitutional: No fever/chills Eyes: No visual changes. ENT: No sore throat. Cardiovascular: Denies chest pain. Respiratory: Denies shortness of breath. Gastrointestinal: No abdominal pain.  No nausea, no vomiting.  No diarrhea.  No constipation. Genitourinary: Negative for dysuria. Musculoskeletal: Negative for back pain. Skin: Negative for rash.Laceration left medial thigh Neurological: Negative for headaches, focal weakness or numbness. Endocrine:Diabetes, hypertension, hyperlipidemia. Allergic/Immunilogical: Penicillin ____________________________________________   PHYSICAL EXAM:  VITAL SIGNS: ED Triage Vitals  Enc Vitals  Group     BP --      Pulse Rate 09/08/16 0945 65     Resp 09/08/16 0945 18     Temp 09/08/16 0945 98.4 F (36.9 C)     Temp Source 09/08/16 0945 Oral     SpO2 09/08/16 0945 97 %     Weight 09/08/16 0930 220 lb (99.8 kg)     Height 09/08/16 0930 5\' 9"  (1.753 m)     Head Circumference --      Peak Flow --      Pain Score 09/08/16 0930 2     Pain Loc --      Pain Edu? --      Excl. in [place]? --     Constitutional: Alert and oriented. Well appearing and in no acute distress. Eyes: Conjunctivae are normal. PERRL. EOMI. Head: Atraumatic. Nose: No congestion/rhinnorhea. Mouth/Throat: Mucous membranes are moist.  Oropharynx non-erythematous. Neck: No stridor.  No cervical spine tenderness to  palpation. Hematological/Lymphatic/Immunilogical: No cervical lymphadenopathy. Cardiovascular: Normal rate, regular rhythm. Grossly normal heart sounds.  Good peripheral circulation. Respiratory: Normal respiratory effort.  No retractions. Lungs CTAB. Gastrointestinal: Soft and nontender. No distention. No abdominal bruits. No CVA tenderness. Musculoskeletal: No lower extremity tenderness nor edema.  No joint effusions. Neurologic:  Normal speech and language. No gross focal neurologic deficits are appreciated. No gait instability. Skin:  Skin is warm, dry and intact. No rash noted. Healing 0.3 cm laceration to the left medial thigh. Psychiatric: Mood and affect are normal. Speech and behavior are normal.  ____________________________________________   LABS (all labs ordered are listed, but only abnormal results are displayed)  Labs Reviewed - No data to display ____________________________________________  EKG   ____________________________________________  RADIOLOGY   ____________________________________________   PROCEDURES  Procedure(s) performed: None  Procedures  Critical Care performed: No  ____________________________________________   INITIAL IMPRESSION / ASSESSMENT  AND PLAN / ED COURSE  Pertinent labs & imaging results that were available during my care of the patient were reviewed by me and considered in my medical decision making (see chart for details).  Healing laceration left medial thigh. Patient given discharge Instructions. Advised to follow-up family doctor.      ____________________________________________   FINAL CLINICAL IMPRESSION(S) / ED DIAGNOSES  Final diagnoses:  Visit for wound check      NEW MEDICATIONS STARTED DURING THIS VISIT:  New Prescriptions   No medications on file     Note:  This document was prepared using Dragon voice recognition software and may include unintentional dictation errors.    Sable Feil, PA-C 09/08/16 [place], MD 09/10/16 2045

## 2016-09-08 NOTE — ED Notes (Signed)
See triage note   States he was seen couple of days ago for wound to left upper leg.  Here for wound recheck

## 2016-09-08 NOTE — ED Triage Notes (Signed)
Pt seen here late Saturday for puncture wound, was told to come back and get packing removed due to patient being on coumadin. Pt A/O, ambulatory to triage.

## 2016-09-13 ENCOUNTER — Encounter: Payer: Self-pay | Admitting: Internal Medicine

## 2016-09-13 DIAGNOSIS — J329 Chronic sinusitis, unspecified: Secondary | ICD-10-CM | POA: Insufficient documentation

## 2016-09-13 DIAGNOSIS — H02409 Unspecified ptosis of unspecified eyelid: Secondary | ICD-10-CM | POA: Insufficient documentation

## 2016-09-13 NOTE — Assessment & Plan Note (Signed)
Blood pressure under good control.  Continue same medication regimen.  Follow pressures.  Follow metabolic panel.   

## 2016-09-13 NOTE — Assessment & Plan Note (Signed)
Persistent problems with allergies and reoccurring sinus infections.  Continue antihistamine and nasal sprays as he is doing.  Takes mucinex prn.  Refer to an allergist.  Follow.

## 2016-09-13 NOTE — Assessment & Plan Note (Signed)
Bilateral.  Causing him to have trouble reading and contributes to headaches intermittently.  Will refer to ENT - Dr Nadeen Landau.

## 2016-09-13 NOTE — Assessment & Plan Note (Signed)
On lipitor.  Follow lipid panel and liver function tests.   

## 2016-09-13 NOTE — Assessment & Plan Note (Signed)
CPAP.  

## 2016-09-13 NOTE — Assessment & Plan Note (Signed)
Diet and exercise.  Follow.  

## 2016-09-13 NOTE — Assessment & Plan Note (Signed)
Low carb diet and exercise.  Follow met b and a1c.  Sugars leveling off.

## 2016-09-13 NOTE — Assessment & Plan Note (Signed)
On protonix.  Controlled.   

## 2016-09-15 ENCOUNTER — Other Ambulatory Visit: Payer: Self-pay | Admitting: Internal Medicine

## 2016-09-17 ENCOUNTER — Encounter: Payer: Self-pay | Admitting: Internal Medicine

## 2016-09-25 ENCOUNTER — Other Ambulatory Visit: Payer: Self-pay | Admitting: Internal Medicine

## 2016-10-28 ENCOUNTER — Other Ambulatory Visit: Payer: Self-pay | Admitting: Internal Medicine

## 2016-11-04 ENCOUNTER — Telehealth: Payer: Self-pay

## 2016-11-04 NOTE — Telephone Encounter (Signed)
Patient called back he does not have surgery app made yet. It is in insurance process now. He does not have app with Dr. Ubaldo Glassing either. He will call and make app as soon as he knows when his surgery is and let us know.

## 2016-11-04 NOTE — Telephone Encounter (Signed)
-----   Message from Einar Pheasant, MD sent at 11/02/2016 12:34 PM EDT ----- Regarding: surgery date I received notification from Dr Nadeen Landau that this pt is planning to have eyelid surgery.  Need to know when surgery is scheduled.  He has an appt with me on 12/10/16.  May be able to do pre op then.  Need to confirm with pt and also need to confirm if has f/u scheduled with Dr Ubaldo Glassing prior to surgery.    Dr Nicki Reaper

## 2016-11-04 NOTE — Telephone Encounter (Signed)
Left message to return call to our office.  

## 2016-12-08 ENCOUNTER — Other Ambulatory Visit (INDEPENDENT_AMBULATORY_CARE_PROVIDER_SITE_OTHER): Payer: Medicare Other

## 2016-12-08 DIAGNOSIS — E119 Type 2 diabetes mellitus without complications: Secondary | ICD-10-CM | POA: Diagnosis not present

## 2016-12-08 DIAGNOSIS — E7800 Pure hypercholesterolemia, unspecified: Secondary | ICD-10-CM | POA: Diagnosis not present

## 2016-12-08 LAB — BASIC METABOLIC PANEL
BUN: 14 mg/dL (ref 6–23)
CHLORIDE: 100 meq/L (ref 96–112)
CO2: 32 mEq/L (ref 19–32)
Calcium: 9.7 mg/dL (ref 8.4–10.5)
Creatinine, Ser: 0.91 mg/dL (ref 0.40–1.50)
GFR: 86.92 mL/min (ref >60.00)
GLUCOSE: 147 mg/dL — AB (ref 70–99)
POTASSIUM: 4.3 meq/L (ref 3.5–5.1)
Sodium: 140 mEq/L (ref 135–145)

## 2016-12-08 LAB — LIPID PANEL
CHOLESTEROL: 140 mg/dL (ref 0–200)
HDL: 34.2 mg/dL — ABNORMAL LOW (ref >39.00)
NonHDL: 105.84
Total CHOL/HDL Ratio: 4
Triglycerides: 261 mg/dL — ABNORMAL HIGH (ref 0.0–149.0)
VLDL: 52.2 mg/dL — ABNORMAL HIGH (ref 0.0–40.0)

## 2016-12-08 LAB — MICROALBUMIN / CREATININE URINE RATIO
Creatinine,U: 76.5 mg/dL
Microalb Creat Ratio: 8.4 mg/g (ref 0.0–30.0)
Microalb, Ur: 6.4 mg/dL — ABNORMAL HIGH (ref 0.0–1.9)

## 2016-12-08 LAB — HEPATIC FUNCTION PANEL
ALT: 23 U/L (ref 0–53)
AST: 20 U/L (ref 0–37)
Albumin: 4.3 g/dL (ref 3.5–5.2)
Alkaline Phosphatase: 41 U/L (ref 39–117)
BILIRUBIN TOTAL: 1.1 mg/dL (ref 0.2–1.2)
Bilirubin, Direct: 0.2 mg/dL (ref 0.0–0.3)
TOTAL PROTEIN: 7.1 g/dL (ref 6.0–8.3)

## 2016-12-08 LAB — HEMOGLOBIN A1C: HEMOGLOBIN A1C: 6.7 % — AB (ref 4.6–6.5)

## 2016-12-08 LAB — LDL CHOLESTEROL, DIRECT: LDL DIRECT: 82 mg/dL

## 2016-12-09 ENCOUNTER — Encounter: Payer: Self-pay | Admitting: Internal Medicine

## 2016-12-10 ENCOUNTER — Encounter: Payer: Self-pay | Admitting: Internal Medicine

## 2016-12-10 ENCOUNTER — Ambulatory Visit (INDEPENDENT_AMBULATORY_CARE_PROVIDER_SITE_OTHER): Payer: Medicare Other | Admitting: Internal Medicine

## 2016-12-10 VITALS — BP 138/70 | HR 64 | Temp 98.6°F | Resp 14 | Ht 69.0 in | Wt 226.4 lb

## 2016-12-10 DIAGNOSIS — G459 Transient cerebral ischemic attack, unspecified: Secondary | ICD-10-CM

## 2016-12-10 DIAGNOSIS — E119 Type 2 diabetes mellitus without complications: Secondary | ICD-10-CM | POA: Diagnosis not present

## 2016-12-10 DIAGNOSIS — Z01818 Encounter for other preprocedural examination: Secondary | ICD-10-CM

## 2016-12-10 DIAGNOSIS — G4733 Obstructive sleep apnea (adult) (pediatric): Secondary | ICD-10-CM | POA: Diagnosis not present

## 2016-12-10 DIAGNOSIS — K219 Gastro-esophageal reflux disease without esophagitis: Secondary | ICD-10-CM | POA: Diagnosis not present

## 2016-12-10 DIAGNOSIS — Z0000 Encounter for general adult medical examination without abnormal findings: Secondary | ICD-10-CM

## 2016-12-10 DIAGNOSIS — H02403 Unspecified ptosis of bilateral eyelids: Secondary | ICD-10-CM

## 2016-12-10 DIAGNOSIS — E7800 Pure hypercholesterolemia, unspecified: Secondary | ICD-10-CM

## 2016-12-10 DIAGNOSIS — I10 Essential (primary) hypertension: Secondary | ICD-10-CM | POA: Diagnosis not present

## 2016-12-10 DIAGNOSIS — E049 Nontoxic goiter, unspecified: Secondary | ICD-10-CM | POA: Diagnosis not present

## 2016-12-10 DIAGNOSIS — M5442 Lumbago with sciatica, left side: Secondary | ICD-10-CM | POA: Diagnosis not present

## 2016-12-10 DIAGNOSIS — Z6833 Body mass index (BMI) 33.0-33.9, adult: Secondary | ICD-10-CM | POA: Diagnosis not present

## 2016-12-10 LAB — HM DIABETES FOOT EXAM

## 2016-12-10 MED ORDER — CLOPIDOGREL BISULFATE 75 MG PO TABS: 75.0000 mg | ORAL_TABLET | Freq: Every day | ORAL | 1 refills | Status: DC

## 2016-12-10 MED ORDER — ALPRAZOLAM 0.25 MG PO TABS: ORAL_TABLET | ORAL | 1 refills | Status: DC

## 2016-12-10 MED ORDER — GLIPIZIDE ER 2.5 MG PO TB24: 7.5000 mg | ORAL_TABLET | Freq: Every morning | ORAL | 1 refills | Status: DC

## 2016-12-10 MED ORDER — METFORMIN HCL 1000 MG PO TABS: 1000.0000 mg | ORAL_TABLET | Freq: Two times a day (BID) | ORAL | 1 refills | Status: DC

## 2016-12-10 MED ORDER — PIOGLITAZONE HCL 30 MG PO TABS: 30.0000 mg | ORAL_TABLET | Freq: Every day | ORAL | 1 refills | Status: DC

## 2016-12-10 MED ORDER — HYDROCHLOROTHIAZIDE 25 MG PO TABS: 25.0000 mg | ORAL_TABLET | Freq: Every day | ORAL | 1 refills | Status: DC

## 2016-12-10 NOTE — Progress Notes (Signed)
Pre-visit discussion using our clinic review tool. No additional management support is needed unless otherwise documented below in the visit note.  

## 2016-12-10 NOTE — Progress Notes (Signed)
Patient ID: Ricardo Nash, male   DOB: [birth year], 71 y.o.   MRN: [id number]   Subjective:    Patient ID: Ricardo Nash, male    DOB: 06/05/1945, 71 y.o.   MRN: [id number]  HPI  Patient here for his physical exam.  Is followed by Dr Jacqlyn Larsen.  He follows psa.  On testosterone supplementation.  Last evaluated 09/2016.  Was having persistent cough.  Saw his allergist.  Was started on xyzal, dymista and singulair.  Is doing better.  Cough is better.  No sob.  No chest pain.  No acid reflux.  No abdominal pain or cramping. Some occasional constipation.  Some bloating associated with this, but overall bowels doing well.  He saw Dr Tiffany Kocher for the bloating.  States felt no further w/up warranted.  Takes two stool softeners.   Is s/p treatment for two abscess teeth.  Feels better. Planning for eyelid surgery 01/08/17.  He has to hold his eyelids up to read, etc.  Contributes to headaches.  Discussed lab results.  Sugars under good control.  a1c 6.7.  Triglycerides are elevated.     Past Medical History:  Diagnosis Date  . C. difficile colitis   . Depression   . Diabetes mellitus ([place])   . Diverticulosis   . Environmental allergies   . GERD (gastroesophageal reflux disease)   . Goiter    intrathoracic, s/p benign biopsy (Dr Harlow Asa)  . Hypercholesterolemia   . Hypertension   . Osteoarthritis    cervical spine, lumbar spine  . Paroxysmal atrial fibrillation ([facility])   . TIA (transient ischemic attack)    Past Surgical History:  Procedure Laterality Date  . BACK SURGERY  8/08   s/p fusion of L4-L5  . CATARACT EXTRACTION  2011   Dr. Herbert Deaner  . [place] SURGERY  2002  . eyelid reduction     Dr. Carlis Abbott  . INGUINAL HERNIA REPAIR  1991   Dr, Tamala Julian  . NOSE SURGERY     turbinate reduction  . SEPTOPLASTY  1975  . SHOULDER SURGERY  2000   rotator cuff   Family History  Problem Relation Age of Onset  . Congestive Heart Failure Father   . Heart disease Father        myocardial infarction    . Rheumatic fever Father        valvular disease  . Heart disease Mother        s/p CABG (age 35)  . Kidney disease Sister   . Colon cancer Neg Hx   . Prostate cancer Neg Hx    Social History   Social History  . Marital status: Married    Spouse name: N/A  . Number of children: 2  . Years of education: N/A   Occupational History  . retired Pharmacist, hospital    Social History Main Topics  . Smoking status: Former Research scientist (life sciences)  . Smokeless tobacco: Never Used  . Alcohol use 0.0 oz/week     Comment: occasional  . Drug use: No  . Sexual activity: Not Currently   Other Topics Concern  . None   Social History Narrative  . None    Outpatient Encounter Prescriptions as of 12/10/2016  Medication Sig  . ALPRAZolam (XANAX) 0.25 MG tablet TAKE 1 TABLET BY MOUTH ONCE DAILY AS NEEDED.  Marland Kitchen amLODipine (NORVASC) 10 MG tablet Take 1 tablet (10 mg total) by mouth daily.  Marland Kitchen aspirin 81 MG tablet Take 81 mg by mouth daily.  Marland Kitchen atorvastatin (LIPITOR)  10 MG tablet TAKE 1 TABLET BY MOUTH ONCE DAILY  . Azelastine-Fluticasone (DYMISTA NA) Place 2 puffs into the nose daily.  . clopidogrel (PLAVIX) 75 MG tablet Take 1 tablet (75 mg total) by mouth daily.  Marland Kitchen glipiZIDE (GLUCOTROL XL) 2.5 MG 24 hr tablet Take 3 tablets (7.5 mg total) by mouth every morning.  Marland Kitchen glucose blood (ACCU-CHEK AVIVA PLUS) test strip USE TO TEST BLOOD SUGAR TWICE DAILY  . hydrochlorothiazide (HYDRODIURIL) 25 MG tablet Take 1 tablet (25 mg total) by mouth daily.  Marland Kitchen levocetirizine (XYZAL) 5 MG tablet Take 5 mg by mouth every evening.  Marland Kitchen lisinopril (PRINIVIL,ZESTRIL) 40 MG tablet TAKE 1 TABLET BY MOUTH ONCE DAILY.  . metFORMIN (GLUCOPHAGE) 1000 MG tablet Take 1 tablet (1,000 mg total) by mouth 2 (two) times daily with a meal.  . metoprolol succinate (TOPROL-XL) 25 MG 24 hr tablet TAKE 1 TABLET (25MG TOTAL) BY MOUTH 2 (TWO) TIMES DAILY  . montelukast (SINGULAIR) 10 MG tablet Take 10 mg by mouth at bedtime.  . Multiple Vitamin (MULTIVITAMIN)  tablet Take 1 tablet by mouth daily.  . Omega-3 Fatty Acids (FISH OIL) 1200 MG CAPS Take by mouth 4 (four) times daily.  . pantoprazole (PROTONIX) 40 MG tablet TAKE 1 TABLET BY MOUTH ONCE DAILY  . pioglitazone (ACTOS) 30 MG tablet Take 1 tablet (30 mg total) by mouth daily.  . sildenafil (REVATIO) 20 MG tablet Take 20 mg by mouth as needed.  . tamsulosin (FLOMAX) 0.4 MG CAPS capsule Take 0.4 mg by mouth daily with breakfast.   . testosterone cypionate (DEPOTESTOTERONE CYPIONATE) 200 MG/ML injection Inject 200 mg into the muscle every 14 (fourteen) days.  . traZODone (DESYREL) 50 MG tablet TAKE 1 & 1/2 TABLETS BY MOUTH AT BEDTIME  . [DISCONTINUED] ALPRAZolam (XANAX) 0.25 MG tablet TAKE 1 TABLET BY MOUTH ONCE DAILY AS NEEDED.  . [DISCONTINUED] clopidogrel (PLAVIX) 75 MG tablet TAKE 1 TABLET BY MOUTH ONCE DAILY  . [DISCONTINUED] glipiZIDE (GLUCOTROL XL) 2.5 MG 24 hr tablet Take 3 tablets (7.5 mg total) by mouth every morning.  . [DISCONTINUED] hydrochlorothiazide (HYDRODIURIL) 25 MG tablet Take 2 tablets (50 mg total) by mouth daily.  . [DISCONTINUED] metFORMIN (GLUCOPHAGE) 1000 MG tablet TAKE 1 TABLET BY MOUTH TWICE DAILY WITH MEALS  . [DISCONTINUED] pioglitazone (ACTOS) 30 MG tablet TAKE ONE TABLET BY MOUTH ONCE DAILY  . [DISCONTINUED] cetirizine (ZYRTEC) 10 MG tablet Take 10 mg by mouth daily.  . [DISCONTINUED] fluticasone (FLONASE) 50 MCG/ACT nasal spray Place 2 sprays into both nostrils daily.   Facility-Administered Encounter Medications as of 12/10/2016  Medication  . methylPREDNISolone acetate (DEPO-MEDROL) injection 40 mg    Review of Systems  Constitutional: Negative for appetite change and unexpected weight change.  HENT: Negative for congestion and sinus pressure.        Congestion much better.   Eyes: Negative for pain and visual disturbance.  Respiratory: Negative for cough, chest tightness and shortness of breath.   Cardiovascular: Negative for chest pain, palpitations and leg  swelling.  Gastrointestinal: Negative for abdominal pain, diarrhea, nausea and vomiting.  Genitourinary: Negative for difficulty urinating and dysuria.  Musculoskeletal: Negative for joint swelling.       Sees Dr Sharlet Salina for his back.  Is better.   Skin: Negative for color change and rash.  Neurological: Negative for dizziness, light-headedness and headaches.  Hematological: Negative for adenopathy. Does not bruise/bleed easily.  Psychiatric/Behavioral: Negative for agitation and dysphoric mood.       Objective:  Physical Exam  Constitutional: He is oriented to person, place, and time. He appears well-developed and well-nourished. No distress.  HENT:  Head: Normocephalic and atraumatic.  Nose: Nose normal.  Mouth/Throat: Oropharynx is clear and moist. No oropharyngeal exudate.  Eyes: Conjunctivae are normal. Right eye exhibits no discharge. Left eye exhibits no discharge.  Neck: Neck supple. No thyromegaly present.  Cardiovascular: Normal rate and regular rhythm.   Pulmonary/Chest: Breath sounds normal. No respiratory distress. He has no wheezes.  Abdominal: Soft. Bowel sounds are normal. There is no tenderness.  Genitourinary:  Genitourinary Comments: Followed by urology.   Musculoskeletal: He exhibits no edema or tenderness.  Feet:  No lesions.  Intact to light touch and pin prick.  DP pulses palpable and equal bilaterally.    Lymphadenopathy:    He has no cervical adenopathy.  Neurological: He is alert and oriented to person, place, and time.  Skin: Skin is warm and dry. No rash noted. No erythema.  Psychiatric: He has a normal mood and affect. His behavior is normal.    BP 138/70   Pulse 64   Temp 98.6 F (37 C) (Oral)   Resp 14   Ht '5\' 9"'  (1.753 m)   Wt 226 lb 6.4 oz (102.7 kg)   SpO2 96%   BMI 33.43 kg/m  Wt Readings from Last 3 Encounters:  12/10/16 226 lb 6.4 oz (102.7 kg)  09/08/16 220 lb (99.8 kg)  09/06/16 220 lb (99.8 kg)     Lab Results  Component  Value Date   WBC 8.0 02/08/2016   HGB 13.6 02/08/2016   HCT 40.1 02/08/2016   PLT 262.0 02/08/2016   GLUCOSE 147 (H) 12/08/2016   CHOL 140 12/08/2016   TRIG 261.0 (H) 12/08/2016   HDL 34.20 (L) 12/08/2016   LDLDIRECT 82.0 12/08/2016   LDLCALC 58 11/21/2013   ALT 23 12/08/2016   AST 20 12/08/2016   NA 140 12/08/2016   K 4.3 12/08/2016   CL 100 12/08/2016   CREATININE 0.91 12/08/2016   BUN 14 12/08/2016   CO2 32 12/08/2016   TSH 0.45 06/05/2016   PSA 0.4 08/11/2014   HGBA1C 6.7 (H) 12/08/2016   MICROALBUR 6.4 (H) 12/08/2016       Assessment & Plan:   Problem List Items Addressed This Visit    Back pain    Followed by Dr Sharlet Salina.  Stable.       BMI 33.0-33.9,adult    Discussed diet and exercise.  Follow.        Diabetes mellitus ([place])    Sugars attached and reviewed.  Low carb diet and exercise.  a1c 6.7.  Follow met b and a1c.  Up to date with eye checks.        Relevant Medications   glipiZIDE (GLUCOTROL XL) 2.5 MG 24 hr tablet   metFORMIN (GLUCOPHAGE) 1000 MG tablet   pioglitazone (ACTOS) 30 MG tablet   Other Relevant Orders   Hemoglobin A1c   Drooping eyelid    Bilateral.  Affects him reading.  Contributes to headaches.  Planning for surgery.  Seeing Dr Carlis Abbott.  Will need to see cardiology prior to his surgery.       GERD (gastroesophageal reflux disease)    Controlled on protonix.        Health care maintenance    Physical today 12/10/16.  Followed by Dr Jacqlyn Larsen for prostate checks and psa.  Colonoscopy 07/21/11.        Hypercholesterolemia    On lipitor.  Low cholesterol diet and exercise.  Follow lipid panel and liver function tests.        Relevant Medications   hydrochlorothiazide (HYDRODIURIL) 25 MG tablet   Other Relevant Orders   Hepatic function panel   Lipid panel   Hypertension    Blood pressure on recheck improved.  His outside checks are under reasonable control.  Follow pressures.  Follow metabolic panel.        Relevant Medications    hydrochlorothiazide (HYDRODIURIL) 25 MG tablet   Other Relevant Orders   CBC with Differential/Platelet   Basic metabolic panel   Intrathoracic goiter    S/p biopsy.  Followed by Dr Harlow Asa.  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  Follow.       Obstructive sleep apnea    CPAP.       TIA (transient ischemic attack)    Has a history of TIA.  On aspirin and plavix.  Has done well.  Discussed stopping the medication for his upcoming surgery.  He understands risk.        Relevant Medications   hydrochlorothiazide (HYDRODIURIL) 25 MG tablet    Other Visit Diagnoses    Routine general medical examination at a health care facility    -  Primary   Pre-op evaluation       Relevant Orders   Ambulatory referral to Cardiology       Einar Pheasant, MD

## 2016-12-10 NOTE — Assessment & Plan Note (Signed)
Physical today 12/10/16.  Followed by Dr Jacqlyn Larsen for prostate checks and psa.  Colonoscopy 07/21/11.

## 2016-12-13 ENCOUNTER — Encounter: Payer: Self-pay | Admitting: Internal Medicine

## 2016-12-13 NOTE — Assessment & Plan Note (Signed)
Discussed diet and exercise.  Follow.  

## 2016-12-13 NOTE — Assessment & Plan Note (Signed)
CPAP.  

## 2016-12-13 NOTE — Assessment & Plan Note (Signed)
Blood pressure on recheck improved.  His outside checks are under reasonable control.  Follow pressures.  Follow metabolic panel.

## 2016-12-13 NOTE — Assessment & Plan Note (Signed)
Has a history of TIA.  On aspirin and plavix.  Has done well.  Discussed stopping the medication for his upcoming surgery.  He understands risk.

## 2016-12-13 NOTE — Assessment & Plan Note (Signed)
Bilateral.  Affects him reading.  Contributes to headaches.  Planning for surgery.  Seeing Dr Carlis Abbott.  Will need to see cardiology prior to his surgery.

## 2016-12-13 NOTE — Assessment & Plan Note (Signed)
S/p biopsy.  Followed by Dr Harlow Asa.  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  Follow.

## 2016-12-13 NOTE — Assessment & Plan Note (Signed)
Controlled on protonix.   

## 2016-12-13 NOTE — Assessment & Plan Note (Signed)
On lipitor.  Low cholesterol diet and exercise.  Follow lipid panel and liver function tests.   

## 2016-12-13 NOTE — Assessment & Plan Note (Signed)
Followed by Dr Chasnis.  Stable.   

## 2016-12-13 NOTE — Assessment & Plan Note (Signed)
Sugars attached and reviewed.  Low carb diet and exercise.  a1c 6.7.  Follow met b and a1c.  Up to date with eye checks.

## 2016-12-25 ENCOUNTER — Other Ambulatory Visit: Payer: Self-pay | Admitting: Internal Medicine

## 2016-12-31 ENCOUNTER — Encounter: Payer: Self-pay | Admitting: Internal Medicine

## 2017-01-02 NOTE — Telephone Encounter (Signed)
Pt called requesting an update. Advised pt of the below message. Pt is getting ready to go out of town and would like [place] to just fax over the form. Please advise, thank you!

## 2017-01-02 NOTE — Telephone Encounter (Signed)
I have completed the form.  I will try to drop by office today.  Please also print Dr Bethanne Ginger cardiology note from 12/25/16.  He clears him from cardiac standpoint and give recs for plavix and aspirin.  Will need to send this note with the form.  Thanks

## 2017-01-23 ENCOUNTER — Telehealth: Payer: Self-pay | Admitting: *Deleted

## 2017-01-23 NOTE — Telephone Encounter (Signed)
It may be gout, but with no history of gout, needs to be evaluated to confirm diagnosis.  We are closing early, would recommend evaluation today.

## 2017-01-23 NOTE — Telephone Encounter (Signed)
Pt is having joint pain in his grand toe on his right foot. He has iced the toe. Pt feels that he may have gout. Pt requested a call to discuss methods of care for the toe.  Pt contact [phone]

## 2017-01-23 NOTE — Telephone Encounter (Signed)
Called patient will go to walk in to get evaluation

## 2017-01-23 NOTE — Telephone Encounter (Signed)
Woke up last night with pain in joint of big toe on right foot. Thinks it may be gout. Not able to walk on it due to pain. Has taken otc meds and ice not helping. Never been diagnosed with gout or had this problem before. Pain is 7/10. Little bit of redness and swelling in the joint area only.

## 2017-01-28 ENCOUNTER — Encounter: Payer: Self-pay | Admitting: Internal Medicine

## 2017-01-30 NOTE — Telephone Encounter (Signed)
Patient stated since email his right great toe has improved greatly. He feels he is fine now but would like to know if it was gout. He has an appointment in December if you think its ok to wait.

## 2017-01-30 NOTE — Telephone Encounter (Signed)
Please call pt and confirm only great toe involvement.  Need note from urgent care if possible.  I can check uric acid level .  Also, if persistent pain, can schedule an appt for f/u.  I have an appt on 02/03/17 at 8:00, but not sure if he wants to wait until then.  Can see if anyone has urgent spot prior to this.  Also can refer to podiatry if prefers.  Let me know what I need to do.  Thanks

## 2017-01-30 NOTE — Telephone Encounter (Signed)
He can come in for a uric acid level to be checked, just to see if elevated (if desires).  We can also check with his next labs.  If he feels he needs to be seen before December (if pain increases,etc or he just wants to be seen), let me know and we will work in somewhere.

## 2017-02-05 ENCOUNTER — Other Ambulatory Visit (INDEPENDENT_AMBULATORY_CARE_PROVIDER_SITE_OTHER): Payer: Medicare Other

## 2017-02-05 ENCOUNTER — Ambulatory Visit (INDEPENDENT_AMBULATORY_CARE_PROVIDER_SITE_OTHER): Payer: Medicare Other

## 2017-02-05 DIAGNOSIS — I10 Essential (primary) hypertension: Secondary | ICD-10-CM

## 2017-02-05 DIAGNOSIS — Z23 Encounter for immunization: Secondary | ICD-10-CM | POA: Diagnosis not present

## 2017-02-05 DIAGNOSIS — E7800 Pure hypercholesterolemia, unspecified: Secondary | ICD-10-CM

## 2017-02-05 DIAGNOSIS — E119 Type 2 diabetes mellitus without complications: Secondary | ICD-10-CM

## 2017-02-05 LAB — CBC WITH DIFFERENTIAL/PLATELET
BASOS PCT: 0.9 % (ref 0.0–3.0)
Basophils Absolute: 0.1 10*3/uL (ref 0.0–0.1)
EOS PCT: 1.3 % (ref 0.0–5.0)
Eosinophils Absolute: 0.1 10*3/uL (ref 0.0–0.7)
HCT: 42.2 % (ref 39.0–52.0)
HEMOGLOBIN: 13.9 g/dL (ref 13.0–17.0)
Lymphocytes Relative: 24.4 % (ref 12.0–46.0)
Lymphs Abs: 1.8 10*3/uL (ref 0.7–4.0)
MCHC: 33 g/dL (ref 30.0–36.0)
MCV: 88.3 fl (ref 78.0–100.0)
MONO ABS: 0.5 10*3/uL (ref 0.1–1.0)
Monocytes Relative: 6.2 % (ref 3.0–12.0)
Neutro Abs: 5 10*3/uL (ref 1.4–7.7)
Neutrophils Relative %: 67.2 % (ref 43.0–77.0)
Platelets: 231 10*3/uL (ref 150.0–400.0)
RBC: 4.78 Mil/uL (ref 4.22–5.81)
RDW: 14.9 % (ref 11.5–15.5)
WBC: 7.5 10*3/uL (ref 4.0–10.5)

## 2017-02-05 LAB — HEPATIC FUNCTION PANEL
ALT: 19 U/L (ref 0–53)
AST: 17 U/L (ref 0–37)
Albumin: 4.1 g/dL (ref 3.5–5.2)
Alkaline Phosphatase: 38 U/L — ABNORMAL LOW (ref 39–117)
Bilirubin, Direct: 0.2 mg/dL (ref 0.0–0.3)
TOTAL PROTEIN: 6.6 g/dL (ref 6.0–8.3)
Total Bilirubin: 0.8 mg/dL (ref 0.2–1.2)

## 2017-02-05 LAB — BASIC METABOLIC PANEL
BUN: 15 mg/dL (ref 6–23)
CALCIUM: 9.6 mg/dL (ref 8.4–10.5)
CO2: 28 mEq/L (ref 19–32)
CREATININE: 0.78 mg/dL (ref 0.40–1.50)
Chloride: 102 mEq/L (ref 96–112)
GFR: 103.79 mL/min (ref >60.00)
Glucose, Bld: 158 mg/dL — ABNORMAL HIGH (ref 70–99)
Potassium: 3.6 mEq/L (ref 3.5–5.1)
SODIUM: 139 meq/L (ref 135–145)

## 2017-02-05 LAB — LIPID PANEL
CHOL/HDL RATIO: 4
Cholesterol: 138 mg/dL (ref 0–200)
HDL: 31.8 mg/dL — AB (ref >39.00)
NONHDL: 106.5
TRIGLYCERIDES: 258 mg/dL — AB (ref 0.0–149.0)
VLDL: 51.6 mg/dL — AB (ref 0.0–40.0)

## 2017-02-05 LAB — HEMOGLOBIN A1C: HEMOGLOBIN A1C: 7.1 % — AB (ref 4.6–6.5)

## 2017-02-05 LAB — LDL CHOLESTEROL, DIRECT: LDL DIRECT: 77 mg/dL

## 2017-02-05 NOTE — Progress Notes (Signed)
Patient received flu shot 

## 2017-02-09 ENCOUNTER — Other Ambulatory Visit: Payer: Self-pay | Admitting: Internal Medicine

## 2017-02-09 ENCOUNTER — Encounter: Payer: Self-pay | Admitting: Internal Medicine

## 2017-02-09 DIAGNOSIS — M79674 Pain in right toe(s): Secondary | ICD-10-CM

## 2017-02-09 NOTE — Telephone Encounter (Signed)
See result note.  [place] notifying pt.

## 2017-02-09 NOTE — Progress Notes (Signed)
Order placed for uric acid.

## 2017-02-10 ENCOUNTER — Other Ambulatory Visit (INDEPENDENT_AMBULATORY_CARE_PROVIDER_SITE_OTHER): Payer: Medicare Other

## 2017-02-10 DIAGNOSIS — M79674 Pain in right toe(s): Secondary | ICD-10-CM | POA: Diagnosis not present

## 2017-02-10 LAB — URIC ACID: URIC ACID, SERUM: 8.2 mg/dL — AB (ref 4.0–7.8)

## 2017-02-13 ENCOUNTER — Encounter: Payer: Self-pay | Admitting: Internal Medicine

## 2017-02-13 ENCOUNTER — Ambulatory Visit (INDEPENDENT_AMBULATORY_CARE_PROVIDER_SITE_OTHER): Payer: Medicare Other | Admitting: Internal Medicine

## 2017-02-13 DIAGNOSIS — E119 Type 2 diabetes mellitus without complications: Secondary | ICD-10-CM | POA: Diagnosis not present

## 2017-02-13 DIAGNOSIS — M109 Gout, unspecified: Secondary | ICD-10-CM

## 2017-02-13 DIAGNOSIS — I10 Essential (primary) hypertension: Secondary | ICD-10-CM | POA: Diagnosis not present

## 2017-02-13 MED ORDER — COLCHICINE 0.6 MG PO TABS: 0.6000 mg | ORAL_TABLET | Freq: Two times a day (BID) | ORAL | 0 refills | Status: DC

## 2017-02-13 NOTE — Progress Notes (Signed)
Patient ID: Ricardo Nash, male   DOB: July 21, 1945, 71 y.o.   MRN: [id number]   Subjective:    Patient ID: Ricardo Nash, male    DOB: 06-Oct-1945, 71 y.o.   MRN: [id number]  HPI  Patient here as a work in with concerns regarding great toe pain.  Was evaluated at Urgent Care and diagnosed with gout.  Symptoms started three weeks ago.  Was swollen and red.  Placed on colchicine (took 3 tablets one day and then repeated in 3 days). Is better.  Still with increased pain.  Not as severe.  Does still limit some activity with pain with walking.  Has never had gout flare previously.  No injury or trauma.  No fever.  No rash.     Past Medical History:  Diagnosis Date  . C. difficile colitis   . Depression   . Diabetes mellitus ([place])   . Diverticulosis   . Environmental allergies   . GERD (gastroesophageal reflux disease)   . Goiter    intrathoracic, s/p benign biopsy (Dr Harlow Asa)  . Hypercholesterolemia   . Hypertension   . Osteoarthritis    cervical spine, lumbar spine  . Paroxysmal atrial fibrillation ([facility])   . TIA (transient ischemic attack)    Past Surgical History:  Procedure Laterality Date  . BACK SURGERY  8/08   s/p fusion of L4-L5  . CATARACT EXTRACTION  2011   Dr. Herbert Deaner  . [place] SURGERY  2002  . eyelid reduction     Dr. Carlis Abbott  . INGUINAL HERNIA REPAIR  1991   Dr, Tamala Julian  . NOSE SURGERY     turbinate reduction  . SEPTOPLASTY  1975  . SHOULDER SURGERY  2000   rotator cuff   Family History  Problem Relation Age of Onset  . Congestive Heart Failure Father   . Heart disease Father        myocardial infarction  . Rheumatic fever Father        valvular disease  . Heart disease Mother        s/p CABG (age 33)  . Kidney disease Sister   . Colon cancer Neg Hx   . Prostate cancer Neg Hx    Social History   Social History  . Marital status: Married    Spouse name: N/A  . Number of children: 2  . Years of education: N/A   Occupational History  .  retired Pharmacist, hospital    Social History Main Topics  . Smoking status: Former Research scientist (life sciences)  . Smokeless tobacco: Never Used  . Alcohol use 0.0 oz/week     Comment: occasional  . Drug use: No  . Sexual activity: Not Currently   Other Topics Concern  . None   Social History Narrative  . None    Outpatient Encounter Prescriptions as of 02/13/2017  Medication Sig  . ALPRAZolam (XANAX) 0.25 MG tablet TAKE 1 TABLET BY MOUTH ONCE DAILY AS NEEDED.  Marland Kitchen amLODipine (NORVASC) 10 MG tablet Take 1 tablet (10 mg total) by mouth daily.  Marland Kitchen aspirin 81 MG tablet Take 81 mg by mouth daily.  Marland Kitchen atorvastatin (LIPITOR) 10 MG tablet TAKE 1 TABLET BY MOUTH ONCE DAILY  . Azelastine-Fluticasone (DYMISTA NA) Place 2 puffs into the nose daily.  . clopidogrel (PLAVIX) 75 MG tablet Take 1 tablet (75 mg total) by mouth daily.  Marland Kitchen glipiZIDE (GLUCOTROL XL) 2.5 MG 24 hr tablet Take 3 tablets (7.5 mg total) by mouth every morning.  Marland Kitchen glucose blood (  ACCU-CHEK AVIVA PLUS) test strip USE TO TEST BLOOD SUGAR TWICE DAILY  . hydrochlorothiazide (HYDRODIURIL) 25 MG tablet Take 1 tablet (25 mg total) by mouth daily.  Marland Kitchen levocetirizine (XYZAL) 5 MG tablet Take 5 mg by mouth every evening.  Marland Kitchen lisinopril (PRINIVIL,ZESTRIL) 40 MG tablet TAKE 1 TABLET BY MOUTH ONCE DAILY.  . metFORMIN (GLUCOPHAGE) 1000 MG tablet Take 1 tablet (1,000 mg total) by mouth 2 (two) times daily with a meal.  . metoprolol succinate (TOPROL-XL) 25 MG 24 hr tablet TAKE 1 TABLET BY MOUTH TWICE DAILY  . montelukast (SINGULAIR) 10 MG tablet Take 10 mg by mouth at bedtime.  . Multiple Vitamin (MULTIVITAMIN) tablet Take 1 tablet by mouth daily.  . Omega-3 Fatty Acids (FISH OIL) 1200 MG CAPS Take by mouth 4 (four) times daily.  . pantoprazole (PROTONIX) 40 MG tablet TAKE 1 TABLET BY MOUTH ONCE DAILY  . pioglitazone (ACTOS) 30 MG tablet Take 1 tablet (30 mg total) by mouth daily.  . sildenafil (REVATIO) 20 MG tablet Take 20 mg by mouth as needed.  . tamsulosin (FLOMAX) 0.4 MG  CAPS capsule Take 0.4 mg by mouth daily with breakfast.   . testosterone cypionate (DEPOTESTOTERONE CYPIONATE) 200 MG/ML injection Inject 200 mg into the muscle every 14 (fourteen) days.  . traZODone (DESYREL) 50 MG tablet TAKE 1 & 1/2 TABLET BY MOUTH AT BEDTIME  . colchicine 0.6 MG tablet Take 1 tablet (0.6 mg total) by mouth 2 (two) times daily.   Facility-Administered Encounter Medications as of 02/13/2017  Medication  . methylPREDNISolone acetate (DEPO-MEDROL) injection 40 mg    Review of Systems  Constitutional: Negative for appetite change and fever.  Cardiovascular: Negative for leg swelling.  Gastrointestinal: Negative for diarrhea, nausea and vomiting.  Musculoskeletal:       Right great toe pain as outlined.   Back doing better.    Skin: Negative for rash.       Redness improved - great toe.         Objective:    Physical Exam  Constitutional: He appears well-developed and well-nourished. No distress.  Neck: Neck supple.  Cardiovascular: Normal rate and regular rhythm.   Pulmonary/Chest: Effort normal and breath sounds normal. No respiratory distress.  Musculoskeletal: He exhibits no edema.  Pain - base of right great toe.  Minimal erythema and swelling.   Lymphadenopathy:    He has no cervical adenopathy.  Psychiatric: He has a normal mood and affect. His behavior is normal.    BP 128/67 (BP Location: Left Arm, Patient Position: Sitting, Cuff Size: Large)   Pulse (!) 54   Temp 98.2 F (36.8 C) (Oral)   Resp 14   Wt 221 lb 12.8 oz (100.6 kg)   SpO2 97%   BMI 32.75 kg/m  Wt Readings from Last 3 Encounters:  02/13/17 221 lb 12.8 oz (100.6 kg)  12/10/16 226 lb 6.4 oz (102.7 kg)  09/08/16 220 lb (99.8 kg)     Lab Results  Component Value Date   WBC 7.5 02/05/2017   HGB 13.9 02/05/2017   HCT 42.2 02/05/2017   PLT 231.0 02/05/2017   GLUCOSE 158 (H) 02/05/2017   CHOL 138 02/05/2017   TRIG 258.0 (H) 02/05/2017   HDL 31.80 (L) 02/05/2017   LDLDIRECT 77.0  02/05/2017   LDLCALC 58 11/21/2013   ALT 19 02/05/2017   AST 17 02/05/2017   NA 139 02/05/2017   K 3.6 02/05/2017   CL 102 02/05/2017   CREATININE 0.78 02/05/2017  BUN 15 02/05/2017   CO2 28 02/05/2017   TSH 0.45 06/05/2016   PSA 0.4 08/11/2014   HGBA1C 7.1 (H) 02/05/2017   MICROALBUR 6.4 (H) 12/08/2016       Assessment & Plan:   Problem List Items Addressed This Visit    Diabetes mellitus ([place])    Has been under reasonable control.  Will avoid adding prednisone.  Follow sugars.        Gout    Presents with redness, pain and swelling of right great toe that appears to be c/w gout.  Recent uric acid level is slightly elevated.  Has never had flare previously.  Was recently treated with a couple of days of colchicine.  Tolerated.  Is some better, but still with pain.  Will place on colchicine as directed.  Discussed gout.  Discussed flares.  Discussed preventative medications.  Discussed hctz.  He desires not to make any changes in his medication and not to take prophylaxis medication at this time.  Has only occurred once and he wants to follow.  Will notify me if persistent or reoccurring problems.        Hypertension    Blood pressure under good control.  Continue same medication regimen.  Follow pressures.  Follow metabolic panel.            Einar Pheasant, MD

## 2017-02-16 ENCOUNTER — Encounter: Payer: Self-pay | Admitting: Internal Medicine

## 2017-02-16 DIAGNOSIS — M109 Gout, unspecified: Secondary | ICD-10-CM | POA: Insufficient documentation

## 2017-02-16 LAB — HM DIABETES EYE EXAM

## 2017-02-16 NOTE — Assessment & Plan Note (Signed)
Has been under reasonable control.  Will avoid adding prednisone.  Follow sugars.

## 2017-02-16 NOTE — Assessment & Plan Note (Signed)
Presents with redness, pain and swelling of right great toe that appears to be c/w gout.  Recent uric acid level is slightly elevated.  Has never had flare previously.  Was recently treated with a couple of days of colchicine.  Tolerated.  Is some better, but still with pain.  Will place on colchicine as directed.  Discussed gout.  Discussed flares.  Discussed preventative medications.  Discussed hctz.  He desires not to make any changes in his medication and not to take prophylaxis medication at this time.  Has only occurred once and he wants to follow.  Will notify me if persistent or reoccurring problems.

## 2017-02-16 NOTE — Assessment & Plan Note (Signed)
Blood pressure under good control.  Continue same medication regimen.  Follow pressures.  Follow metabolic panel.   

## 2017-02-24 ENCOUNTER — Other Ambulatory Visit: Payer: Self-pay | Admitting: Internal Medicine

## 2017-03-24 ENCOUNTER — Other Ambulatory Visit: Payer: Self-pay | Admitting: Internal Medicine

## 2017-03-26 LAB — HM DIABETES EYE EXAM

## 2017-04-08 DIAGNOSIS — Z79899 Other long term (current) drug therapy: Secondary | ICD-10-CM | POA: Insufficient documentation

## 2017-04-09 ENCOUNTER — Other Ambulatory Visit: Payer: Self-pay | Admitting: Internal Medicine

## 2017-04-10 ENCOUNTER — Telehealth: Payer: Self-pay | Admitting: Radiology

## 2017-04-10 ENCOUNTER — Telehealth: Payer: Self-pay

## 2017-04-10 NOTE — Telephone Encounter (Signed)
Pt coming in for labs Monday, please place future orders. Thank you 

## 2017-04-10 NOTE — Telephone Encounter (Signed)
Fax was received. Call [facility] back and left detailed message  Copied from [place] [phone]. Topic: General - Other >> Apr 10, 2017  4:08 PM Valla Leaver wrote: Reason for CRM: [employer] faxed a medical records clarification request on 11/29 and they want to know if it was received?

## 2017-04-11 ENCOUNTER — Telehealth: Payer: Self-pay | Admitting: Internal Medicine

## 2017-04-11 NOTE — Telephone Encounter (Signed)
Message sent to pt to notify no need to come to 04/13/17 lab appt.

## 2017-04-12 NOTE — Telephone Encounter (Signed)
Pt notified that he did not need to come in for labs.  He had drawn in 01/2017.

## 2017-04-13 ENCOUNTER — Other Ambulatory Visit: Payer: Medicare Other

## 2017-04-15 ENCOUNTER — Encounter: Payer: Self-pay | Admitting: Internal Medicine

## 2017-04-15 ENCOUNTER — Ambulatory Visit: Payer: Medicare Other | Admitting: Internal Medicine

## 2017-04-15 DIAGNOSIS — E119 Type 2 diabetes mellitus without complications: Secondary | ICD-10-CM

## 2017-04-15 DIAGNOSIS — I10 Essential (primary) hypertension: Secondary | ICD-10-CM | POA: Diagnosis not present

## 2017-04-15 DIAGNOSIS — M5442 Lumbago with sciatica, left side: Secondary | ICD-10-CM

## 2017-04-15 DIAGNOSIS — K219 Gastro-esophageal reflux disease without esophagitis: Secondary | ICD-10-CM | POA: Diagnosis not present

## 2017-04-15 DIAGNOSIS — H02403 Unspecified ptosis of bilateral eyelids: Secondary | ICD-10-CM | POA: Diagnosis not present

## 2017-04-15 DIAGNOSIS — E7800 Pure hypercholesterolemia, unspecified: Secondary | ICD-10-CM | POA: Diagnosis not present

## 2017-04-15 DIAGNOSIS — Z6833 Body mass index (BMI) 33.0-33.9, adult: Secondary | ICD-10-CM

## 2017-04-15 DIAGNOSIS — J329 Chronic sinusitis, unspecified: Secondary | ICD-10-CM | POA: Diagnosis not present

## 2017-04-15 DIAGNOSIS — G4733 Obstructive sleep apnea (adult) (pediatric): Secondary | ICD-10-CM

## 2017-04-15 MED ORDER — ALPRAZOLAM 0.25 MG PO TABS: 0.2500 mg | ORAL_TABLET | Freq: Every day | ORAL | 1 refills | Status: DC | PRN

## 2017-04-15 NOTE — Progress Notes (Signed)
Patient ID: Ricardo Nash, male   DOB: [birth year], 71 y.o.   MRN: [id number]   Subjective:    Patient ID: Ricardo Nash, male    DOB: [birth year], 71 y.o.   MRN: [id number]  HPI  Patient here for a scheduled follow up.  He reports he is doing relatively well.  Stays active.  Going to the gym.  No chest pain.  No sob.  No acid reflux.  No abdominal pain.  Bowels moving.  States sugars are doing well now.  Had a short period where they were elevated, but now am sugars averaging 130-140 and pm sugars around 100.  Sees Dr Donneta Romberg tomorrow.  Allergies stable.  On singulair.  Seeing Dr Jacqlyn Larsen.  Receiving testosterone injections.     Past Medical History:  Diagnosis Date  . C. difficile colitis   . Depression   . Diabetes mellitus ([place])   . Diverticulosis   . Environmental allergies   . GERD (gastroesophageal reflux disease)   . Goiter    intrathoracic, s/p benign biopsy (Dr Harlow Asa)  . Hypercholesterolemia   . Hypertension   . Osteoarthritis    cervical spine, lumbar spine  . Paroxysmal atrial fibrillation (HCC)   . TIA (transient ischemic attack)    Past Surgical History:  Procedure Laterality Date  . BACK SURGERY  8/08   s/p fusion of L4-L5  . CATARACT EXTRACTION  2011   Dr. Herbert Deaner  . [place] SURGERY  2002  . eyelid reduction     Dr. Carlis Abbott  . INGUINAL HERNIA REPAIR  1991   Dr, Tamala Julian  . NOSE SURGERY     turbinate reduction  . SEPTOPLASTY  1975  . SHOULDER SURGERY  2000   rotator cuff   Family History  Problem Relation Age of Onset  . Congestive Heart Failure Father   . Heart disease Father        myocardial infarction  . Rheumatic fever Father        valvular disease  . Heart disease Mother        s/p CABG (age 71)  . Kidney disease Sister   . Colon cancer Neg Hx   . Prostate cancer Neg Hx    Social History   Socioeconomic History  . Marital status: Married    Spouse name: None  . Number of children: 2  . Years of education: None  . Highest  education level: None  Social Needs  . Financial resource strain: None  . Food insecurity - worry: None  . Food insecurity - inability: None  . Transportation needs - medical: None  . Transportation needs - non-medical: None  Occupational History  . Occupation: retired Pharmacist, hospital  Tobacco Use  . Smoking status: Former Research scientist (life sciences)  . Smokeless tobacco: Never Used  Substance and Sexual Activity  . Alcohol use: Yes    Alcohol/week: 0.0 oz    Comment: occasional  . Drug use: No  . Sexual activity: Not Currently  Other Topics Concern  . None  Social History Narrative  . None    Outpatient Encounter Medications as of 04/15/2017  Medication Sig  . ACCU-CHEK AVIVA PLUS test strip USE TO TEST BLOOD SUGAR TWICE DAILY  . ALPRAZolam (XANAX) 0.25 MG tablet Take 1 tablet (0.25 mg total) by mouth daily as needed.  Marland Kitchen amLODipine (NORVASC) 10 MG tablet Take 1 tablet (10 mg total) by mouth daily.  Marland Kitchen aspirin 81 MG tablet Take 81 mg by mouth daily.  Marland Kitchen atorvastatin (LIPITOR)  10 MG tablet TAKE 1 TABLET BY MOUTH ONCE DAILY  . Azelastine-Fluticasone (DYMISTA NA) Place 2 puffs into the nose daily.  . clopidogrel (PLAVIX) 75 MG tablet Take 1 tablet (75 mg total) by mouth daily.  . colchicine 0.6 MG tablet Take 1 tablet (0.6 mg total) by mouth 2 (two) times daily.  Marland Kitchen glipiZIDE (GLUCOTROL XL) 2.5 MG 24 hr tablet TAKE 3 TABLETS BY MOUTH ONCE EVERY MORNING  . hydrochlorothiazide (HYDRODIURIL) 25 MG tablet Take 1 tablet (25 mg total) by mouth daily.  Marland Kitchen levocetirizine (XYZAL) 5 MG tablet Take 5 mg by mouth every evening.  Marland Kitchen lisinopril (PRINIVIL,ZESTRIL) 40 MG tablet TAKE 1 TABLET BY MOUTH ONCE DAILY.  . metFORMIN (GLUCOPHAGE) 1000 MG tablet Take 1 tablet (1,000 mg total) by mouth 2 (two) times daily with a meal.  . metoprolol succinate (TOPROL-XL) 25 MG 24 hr tablet TAKE 1 TABLET BY MOUTH TWICE DAILY  . montelukast (SINGULAIR) 10 MG tablet Take 10 mg by mouth at bedtime.  . Multiple Vitamin (MULTIVITAMIN) tablet Take  1 tablet by mouth daily.  . Omega-3 Fatty Acids (FISH OIL) 1200 MG CAPS Take by mouth 4 (four) times daily.  . pantoprazole (PROTONIX) 40 MG tablet TAKE 1 TABLET BY MOUTH ONCE DAILY  . pioglitazone (ACTOS) 30 MG tablet TAKE 1 TABLET BY MOUTH ONCE DAILY  . sildenafil (REVATIO) 20 MG tablet Take 20 mg by mouth as needed.  . tamsulosin (FLOMAX) 0.4 MG CAPS capsule Take 0.4 mg by mouth daily with breakfast.   . testosterone cypionate (DEPOTESTOTERONE CYPIONATE) 200 MG/ML injection Inject 200 mg into the muscle every 14 (fourteen) days.  . traZODone (DESYREL) 50 MG tablet TAKE 1 & 1/2 TABLET BY MOUTH AT BEDTIME  . [DISCONTINUED] ALPRAZolam (XANAX) 0.25 MG tablet TAKE 1 TABLET BY MOUTH ONCE DAILY AS NEEDED  . [DISCONTINUED] metoprolol succinate (TOPROL-XL) 25 MG 24 hr tablet TAKE 1 TABLET BY MOUTH TWICE DAILY   Facility-Administered Encounter Medications as of 04/15/2017  Medication  . methylPREDNISolone acetate (DEPO-MEDROL) injection 40 mg    Review of Systems  Constitutional: Negative for appetite change and unexpected weight change.  HENT: Negative for congestion and sinus pressure.   Respiratory: Negative for cough, chest tightness and shortness of breath.   Cardiovascular: Negative for chest pain, palpitations and leg swelling.  Gastrointestinal: Negative for abdominal pain, diarrhea, nausea and vomiting.  Genitourinary: Negative for difficulty urinating and dysuria.  Musculoskeletal: Negative for joint swelling and myalgias.  Skin: Negative for color change and rash.  Neurological: Negative for dizziness, light-headedness and headaches.  Psychiatric/Behavioral: Negative for agitation and dysphoric mood.       Objective:    Physical Exam  Constitutional: He appears well-developed and well-nourished. No distress.  HENT:  Nose: Nose normal.  Mouth/Throat: Oropharynx is clear and moist.  Neck: Neck supple.  Cardiovascular: Normal rate and regular rhythm.  Pulmonary/Chest: Effort  normal and breath sounds normal. No respiratory distress.  Abdominal: Soft. Bowel sounds are normal. There is no tenderness.  Musculoskeletal: He exhibits no edema or tenderness.  Lymphadenopathy:    He has no cervical adenopathy.  Skin: No rash noted. No erythema.  Psychiatric: He has a normal mood and affect. His behavior is normal.    BP 122/66   Pulse 61   Temp 98.5 F (36.9 C)   Resp 18   Ht '5\' 9"'  (1.753 m)   Wt 224 lb 2 oz (101.7 kg)   SpO2 97%   BMI 33.10 kg/m  Wt Readings from  Last 3 Encounters:  04/15/17 224 lb 2 oz (101.7 kg)  02/13/17 221 lb 12.8 oz (100.6 kg)  12/10/16 226 lb 6.4 oz (102.7 kg)     Lab Results  Component Value Date   WBC 7.5 02/05/2017   HGB 13.9 02/05/2017   HCT 42.2 02/05/2017   PLT 231.0 02/05/2017   GLUCOSE 158 (H) 02/05/2017   CHOL 138 02/05/2017   TRIG 258.0 (H) 02/05/2017   HDL 31.80 (L) 02/05/2017   LDLDIRECT 77.0 02/05/2017   LDLCALC 58 11/21/2013   ALT 19 02/05/2017   AST 17 02/05/2017   NA 139 02/05/2017   K 3.6 02/05/2017   CL 102 02/05/2017   CREATININE 0.78 02/05/2017   BUN 15 02/05/2017   CO2 28 02/05/2017   TSH 0.45 06/05/2016   PSA 0.4 08/11/2014   HGBA1C 7.1 (H) 02/05/2017   MICROALBUR 6.4 (H) 12/08/2016       Assessment & Plan:   Problem List Items Addressed This Visit    Back pain    Followed by Dr Sharlet Salina. Doing better.        BMI 33.0-33.9,adult    Discussed diet and exercise.  States he has lost inches.  Is exercising.        Diabetes mellitus ([place])    Sugars as outlined.  Appear to be doing well.  Discussed low carb diet and exercise.  Follow met b and a1c.  Up to date with eye checks.        Relevant Orders   Hemoglobin S8G   Basic metabolic panel   Drooping eyelid    S/p surgery and doing well.  Follow.       Frequent sinus infections    Sees Dr Donneta Romberg.  On singulair.  Stable.  Doing better.       GERD (gastroesophageal reflux disease)    Controlled on current regimen         Hypercholesterolemia    On lipitor.  Low cholesterol diet and exercise.  Follow lipid panel and liver function tests.        Relevant Orders   Lipid panel   Hepatic function panel   Hypertension    Blood pressure under good control.  Continue same medication regimen.  Follow pressures.  Follow metabolic panel.        Relevant Orders   TSH   Obstructive sleep apnea    CPAP.           Einar Pheasant, MD

## 2017-04-18 ENCOUNTER — Encounter: Payer: Self-pay | Admitting: Internal Medicine

## 2017-04-18 NOTE — Assessment & Plan Note (Signed)
Blood pressure under good control.  Continue same medication regimen.  Follow pressures.  Follow metabolic panel.   

## 2017-04-18 NOTE — Assessment & Plan Note (Signed)
Sugars as outlined.  Appear to be doing well.  Discussed low carb diet and exercise.  Follow met b and a1c.  Up to date with eye checks.

## 2017-04-18 NOTE — Assessment & Plan Note (Signed)
Sees Dr Donneta Romberg.  On singulair.  Stable.  Doing better.

## 2017-04-18 NOTE — Assessment & Plan Note (Signed)
CPAP.  

## 2017-04-18 NOTE — Assessment & Plan Note (Signed)
S/p surgery and doing well.  Follow.   

## 2017-04-18 NOTE — Assessment & Plan Note (Signed)
Discussed diet and exercise.  States he has lost inches.  Is exercising.

## 2017-04-18 NOTE — Assessment & Plan Note (Signed)
On lipitor.  Low cholesterol diet and exercise.  Follow lipid panel and liver function tests.   

## 2017-04-18 NOTE — Assessment & Plan Note (Signed)
Followed by Dr Sharlet Salina. Doing better.

## 2017-04-18 NOTE — Assessment & Plan Note (Signed)
Controlled on current regimen.   

## 2017-04-21 ENCOUNTER — Other Ambulatory Visit: Payer: Self-pay | Admitting: Internal Medicine

## 2017-04-23 ENCOUNTER — Encounter: Payer: Self-pay | Admitting: Internal Medicine

## 2017-06-08 ENCOUNTER — Ambulatory Visit (INDEPENDENT_AMBULATORY_CARE_PROVIDER_SITE_OTHER): Payer: Medicare Other

## 2017-06-08 VITALS — BP 142/70 | HR 69 | Temp 98.4°F | Resp 14 | Ht 68.0 in | Wt 223.4 lb

## 2017-06-08 DIAGNOSIS — Z0000 Encounter for general adult medical examination without abnormal findings: Secondary | ICD-10-CM

## 2017-06-08 NOTE — Patient Instructions (Addendum)
  Mr. Ricardo Nash , Thank you for taking time to come for your Medicare Wellness Visit. I appreciate your ongoing commitment to your health goals. Please review the following plan we discussed and let me know if I can assist you in the future.   Follow up with Dr. Nicki Reaper as needed.    Bring BP readings from home.  Have a great day!  These are the goals we discussed: Goals    . Healthy Lifestyle     Stay hydrated Low carb diet Exercise       This is a list of the screening recommended for you and due dates:  Health Maintenance  Topic Date Due  . Hemoglobin A1C  08/05/2017  . Complete foot exam   12/10/2017  . Eye exam for diabetics  03/26/2018  . Tetanus Vaccine  05/12/2021  . Colon Cancer Screening  07/20/2021  . Flu Shot  Completed  .  Hepatitis C: One time screening is recommended by Center for Disease Control  (CDC) for  adults born from [birth year] through 1966.   Completed  . Pneumonia vaccines  Completed

## 2017-06-08 NOTE — Progress Notes (Signed)
Subjective:   Ricardo Nash is a 72 y.o. male who presents for Medicare Annual/Subsequent preventive examination.  Review of Systems:  No ROS.  Medicare Wellness Visit. Additional risk factors are reflected in the social history. Cardiac Risk Factors include: advanced age (>34men, >33 women);male gender;hypertension;diabetes mellitus;obesity (BMI >30kg/m2)     Objective:    Vitals: BP (!) 142/70 (BP Location: Left Arm, Patient Position: Sitting, Cuff Size: Normal)   Pulse 69   Temp 98.4 F (36.9 C) (Oral)   Resp 14   Ht 5\' 8"  (1.727 m)   Wt 223 lb 6.4 oz (101.3 kg)   SpO2 96%   BMI 33.97 kg/m   Body mass index is 33.97 kg/m.  Advanced Directives 06/08/2017 09/08/2016 09/06/2016 06/05/2016  Does Patient Have a Medical Advance Directive? Yes No No Yes  Type of Paramedic of [place];Living will - - [place];Living will  Does patient want to make changes to medical advance directive? No - Patient declined - - No - Patient declined  Copy of [place] in Chart? Yes - - No - copy requested  Would patient like information on creating a medical advance directive? - No - Patient declined No - Patient declined -    Tobacco Social History   Tobacco Use  Smoking Status Former Smoker  Smokeless Tobacco Never Used     Counseling given: Not Answered   Clinical Intake:  Pre-visit preparation completed: Yes  Pain : No/denies pain     Nutritional Status: BMI > 30  Obese Diabetes: Yes(Followed by PCP)  How often do you need to have someone help you when you read instructions, pamphlets, or other written materials from your doctor or pharmacy?: 1 - Never  Interpreter Needed?: No     Past Medical History:  Diagnosis Date  . C. difficile colitis   . Depression   . Diabetes mellitus ([place])   . Diverticulosis   . Environmental allergies   . GERD (gastroesophageal reflux disease)   . Goiter    intrathoracic,  s/p benign biopsy (Dr Harlow Asa)  . Hypercholesterolemia   . Hypertension   . Osteoarthritis    cervical spine, lumbar spine  . Paroxysmal atrial fibrillation (HCC)   . TIA (transient ischemic attack)    Past Surgical History:  Procedure Laterality Date  . BACK SURGERY  8/08   s/p fusion of L4-L5  . CATARACT EXTRACTION  2011   Dr. Herbert Deaner  . [place] SURGERY  2002  . eyelid reduction     Dr. Carlis Abbott  . INGUINAL HERNIA REPAIR  1991   Dr, Tamala Julian  . NOSE SURGERY     turbinate reduction  . SEPTOPLASTY  1975  . SHOULDER SURGERY  2000   rotator cuff   Family History  Problem Relation Age of Onset  . Congestive Heart Failure Father   . Heart disease Father        myocardial infarction  . Rheumatic fever Father        valvular disease  . Heart disease Mother        s/p CABG (age 14)  . Kidney disease Sister   . Colon cancer Neg Hx   . Prostate cancer Neg Hx    Social History   Socioeconomic History  . Marital status: Married    Spouse name: None  . Number of children: 2  . Years of education: None  . Highest education level: None  Social Needs  .  Financial resource strain: None  . Food insecurity - worry: None  . Food insecurity - inability: None  . Transportation needs - medical: None  . Transportation needs - non-medical: None  Occupational History  . Occupation: retired Pharmacist, hospital  Tobacco Use  . Smoking status: Former Research scientist (life sciences)  . Smokeless tobacco: Never Used  Substance and Sexual Activity  . Alcohol use: Yes    Alcohol/week: 0.0 oz    Comment: occasional  . Drug use: No  . Sexual activity: Not Currently  Other Topics Concern  . None  Social History Narrative  . None    Outpatient Encounter Medications as of 06/08/2017  Medication Sig  . ACCU-CHEK AVIVA PLUS test strip USE TO TEST BLOOD SUGAR TWICE DAILY  . ALPRAZolam (XANAX) 0.25 MG tablet Take 1 tablet (0.25 mg total) by mouth daily as needed.  Marland Kitchen amLODipine (NORVASC) 10 MG tablet Take 1 tablet (10 mg  total) by mouth daily.  Marland Kitchen aspirin 81 MG tablet Take 81 mg by mouth daily.  Marland Kitchen atorvastatin (LIPITOR) 10 MG tablet TAKE 1 TABLET BY MOUTH ONCE DAILY  . Azelastine-Fluticasone (DYMISTA NA) Place 2 puffs into the nose daily.  . clopidogrel (PLAVIX) 75 MG tablet Take 1 tablet (75 mg total) by mouth daily.  . colchicine 0.6 MG tablet Take 1 tablet (0.6 mg total) by mouth 2 (two) times daily.  Marland Kitchen glipiZIDE (GLUCOTROL XL) 2.5 MG 24 hr tablet TAKE 3 TABLETS BY MOUTH ONCE EVERY MORNING  . hydrochlorothiazide (HYDRODIURIL) 25 MG tablet Take 1 tablet (25 mg total) by mouth daily.  Marland Kitchen levocetirizine (XYZAL) 5 MG tablet Take 5 mg by mouth every evening.  Marland Kitchen lisinopril (PRINIVIL,ZESTRIL) 40 MG tablet TAKE 1 TABLET BY MOUTH ONCE DAILY.  . metFORMIN (GLUCOPHAGE) 1000 MG tablet Take 1 tablet (1,000 mg total) by mouth 2 (two) times daily with a meal.  . metoprolol succinate (TOPROL-XL) 25 MG 24 hr tablet TAKE 1 TABLET BY MOUTH TWICE DAILY  . montelukast (SINGULAIR) 10 MG tablet Take 10 mg by mouth at bedtime.  . Multiple Vitamin (MULTIVITAMIN) tablet Take 1 tablet by mouth daily.  . Omega-3 Fatty Acids (FISH OIL) 1200 MG CAPS Take by mouth 4 (four) times daily.  . pantoprazole (PROTONIX) 40 MG tablet TAKE 1 TABLET BY MOUTH ONCE DAILY  . pioglitazone (ACTOS) 30 MG tablet TAKE 1 TABLET BY MOUTH ONCE DAILY  . PROAIR HFA 108 (90 Base) MCG/ACT inhaler INHALE 2 PUFFS INTO THE LUNGS EVERY 6 HOURS IF NEEDED FOR WHEEZING ORSHORTNESS OF BREATH  . sildenafil (REVATIO) 20 MG tablet Take 20 mg by mouth as needed.  . tamsulosin (FLOMAX) 0.4 MG CAPS capsule Take 0.4 mg by mouth daily with breakfast.   . testosterone cypionate (DEPOTESTOTERONE CYPIONATE) 200 MG/ML injection Inject 200 mg into the muscle every 14 (fourteen) days.  . traZODone (DESYREL) 50 MG tablet TAKE 1&1/2 TABLETS BY MOUTH AT BEDTIME   Facility-Administered Encounter Medications as of 06/08/2017  Medication  . methylPREDNISolone acetate (DEPO-MEDROL) injection  40 mg    Activities of Daily Living In your present state of health, do you have any difficulty performing the following activities: 06/08/2017  Hearing? Y  Comment Hearing aids, bilateral  Vision? N  Difficulty concentrating or making decisions? N  Walking or climbing stairs? Y  Comment L knee pain, intermittent  Dressing or bathing? N  Doing errands, shopping? N  Preparing Food and eating ? N  Using the Toilet? N  In the past six months, have you accidently leaked urine?  N  Do you have problems with loss of bowel control? N  Managing your Medications? N  Managing your Finances? N  Housekeeping or managing your Housekeeping? N  Some recent data might be hidden    Patient Care Team: Einar Pheasant, MD as PCP - General (Internal Medicine)   Assessment:   This is a routine wellness examination for Ricardo Nash. The goal of the wellness visit is to assist the patient how to close the gaps in care and create a preventative care plan for the patient.   The roster of all physicians providing medical care to patient is listed in the Snapshot section of the chart.  Osteoporosis risk reviewed.    Safety issues reviewed; Smoke and carbon monoxide detectors in the home. No firearms in the home.  Wears seatbelts when driving or riding with others. Patient does wear sunscreen or protective clothing when in direct sunlight. No violence in the home.  Patient is alert, normal appearance, oriented to person/place/and time. Correctly identified the president of the [place], recall of 3/3 words, and performing simple calculations. Displays appropriate judgement and can read correct time from watch face.   No new identified risk were noted.  No failures at ADL's or IADL's.    BMI- discussed the importance of a healthy diet, water intake and the benefits of aerobic exercise. Educational material provided.   24 hour diet recall: Low carb diet  Daily fluid intake: 1 cups of caffeine, 8 cups of  water  Dental- every 6 months.  Eye- Visual acuity not assessed per patient preference since they have regular follow up with the ophthalmologist.  Wears corrective lenses.  Sleep patterns- Sleeps 8 hours at night.  Wakes feeling rested. CPAP in use.  Blood sugars are averaging 140 in the morning and 100 in the evening.    HTN; monitoring BP at home, to bring report in to next visit with PCP.  No headaches, chest pain or shortness or breath.  Health maintenance gaps closed.   Patient Concerns: Bloating, constipation, flatulence, intermittently.  Scheduled follow up with PCP in 2 days.  Exercise Activities and Dietary recommendations Current Exercise Habits: Home exercise routine, Type of exercise: walking;stretching;strength training/weights, Frequency (Times/Week): 5, Intensity: Mild  Goals    . Healthy Lifestyle     Stay hydrated Low carb diet Exercise       Fall Risk Fall Risk  06/08/2017 06/05/2016 09/07/2015 09/06/2014 07/14/2013  Falls in the past year? No Yes No No Yes  Number falls in past yr: - 2 or more - - 1  Injury with Fall? - No - - No  Follow up - Falls prevention discussed;Education provided - - -   Depression Screen PHQ 2/9 Scores 06/08/2017 06/05/2016 09/07/2015 09/06/2014  PHQ - 2 Score 1 0 2 1  PHQ- 9 Score - - 8 -    Cognitive Function MMSE - Mini Mental State Exam 06/05/2016  Orientation to time 5  Orientation to Place 5  Registration 3  Attention/ Calculation 5  Recall 3  Language- name 2 objects 2  Language- repeat 1  Language- follow 3 step command 3  Language- read & follow direction 1  Write a sentence 1  Copy design 1  Total score 30     6CIT Screen 06/08/2017  What Year? 0 points  What month? 0 points  What time? 0 points  Count back from 20 0 points  Months in reverse 0 points  Repeat phrase 0 points  Total Score 0  Immunization History  Administered Date(s) Administered  . Influenza Split 02/16/2012, 02/13/2014  . Influenza,  High Dose Seasonal PF 02/20/2016, 02/05/2017  . Influenza,inj,Quad PF,6+ Mos 01/14/2013, 03/08/2015  . Pneumococcal Conjugate-13 03/10/2013  . Pneumococcal Polysaccharide-23 08/23/2014  . Tdap 05/13/2011  . Zoster 12/08/2013  . Zoster Recombinat (Shingrix) 04/24/2017   Screening Tests Health Maintenance  Topic Date Due  . HEMOGLOBIN A1C  08/05/2017  . FOOT EXAM  12/10/2017  . OPHTHALMOLOGY EXAM  03/26/2018  . TETANUS/TDAP  05/12/2021  . COLONOSCOPY  07/20/2021  . INFLUENZA VACCINE  Completed  . Hepatitis C Screening  Completed  . PNA vac Low Risk Adult  Completed    Plan:    End of life planning; Advance aging; Advanced directives discussed. Copy of current HCPOA/Living Will on file.    I have personally reviewed and noted the following in the patient's chart:   . Medical and social history . Use of alcohol, tobacco or illicit drugs  . Current medications and supplements . Functional ability and status . Nutritional status . Physical activity . Advanced directives . List of other physicians . Hospitalizations, surgeries, and ER visits in previous 12 months . Vitals . Screenings to include cognitive, depression, and falls . Referrals and appointments  In addition, I have reviewed and discussed with patient certain preventive protocols, quality metrics, and best practice recommendations. A written personalized care plan for preventive services as well as general preventive health recommendations were provided to patient.     Varney Biles, LPN  6/83/7290   Reviewed above information.  Agree with the need for evaluation.    Dr Nicki Reaper

## 2017-06-10 ENCOUNTER — Other Ambulatory Visit: Payer: Self-pay | Admitting: Internal Medicine

## 2017-06-10 ENCOUNTER — Other Ambulatory Visit: Payer: Self-pay | Admitting: Family

## 2017-06-10 DIAGNOSIS — I10 Essential (primary) hypertension: Secondary | ICD-10-CM

## 2017-06-11 ENCOUNTER — Ambulatory Visit
Admission: RE | Admit: 2017-06-11 | Discharge: 2017-06-11 | Disposition: A | Discharge status: Home / Self Care | Payer: Medicare Other | Source: Ambulatory Visit | Attending: Internal Medicine | Admitting: Internal Medicine

## 2017-06-11 ENCOUNTER — Encounter: Payer: Self-pay | Admitting: Internal Medicine

## 2017-06-11 ENCOUNTER — Ambulatory Visit: Payer: Medicare Other | Admitting: Internal Medicine

## 2017-06-11 VITALS — BP 162/82 | HR 71 | Temp 98.7°F | Resp 18 | Wt 225.6 lb

## 2017-06-11 DIAGNOSIS — R1032 Left lower quadrant pain: Secondary | ICD-10-CM

## 2017-06-11 DIAGNOSIS — N400 Enlarged prostate without lower urinary tract symptoms: Secondary | ICD-10-CM | POA: Diagnosis not present

## 2017-06-11 DIAGNOSIS — R197 Diarrhea, unspecified: Secondary | ICD-10-CM

## 2017-06-11 DIAGNOSIS — K8020 Calculus of gallbladder without cholecystitis without obstruction: Secondary | ICD-10-CM | POA: Insufficient documentation

## 2017-06-11 DIAGNOSIS — R1084 Generalized abdominal pain: Secondary | ICD-10-CM

## 2017-06-11 DIAGNOSIS — I10 Essential (primary) hypertension: Secondary | ICD-10-CM | POA: Diagnosis not present

## 2017-06-11 DIAGNOSIS — E119 Type 2 diabetes mellitus without complications: Secondary | ICD-10-CM | POA: Diagnosis not present

## 2017-06-11 DIAGNOSIS — K5730 Diverticulosis of large intestine without perforation or abscess without bleeding: Secondary | ICD-10-CM | POA: Diagnosis not present

## 2017-06-11 DIAGNOSIS — I700 Atherosclerosis of aorta: Secondary | ICD-10-CM | POA: Insufficient documentation

## 2017-06-11 DIAGNOSIS — R112 Nausea with vomiting, unspecified: Secondary | ICD-10-CM | POA: Diagnosis not present

## 2017-06-11 DIAGNOSIS — R109 Unspecified abdominal pain: Secondary | ICD-10-CM | POA: Insufficient documentation

## 2017-06-11 DIAGNOSIS — D1779 Benign lipomatous neoplasm of other sites: Secondary | ICD-10-CM | POA: Insufficient documentation

## 2017-06-11 LAB — POCT I-STAT CREATININE: Creatinine, Ser: 1 mg/dL (ref 0.61–1.24)

## 2017-06-11 MED ORDER — IOPAMIDOL (ISOVUE-300) INJECTION 61%
100.0000 mL | Freq: Once | INTRAVENOUS | Status: AC | PRN
  Administered 2017-06-11: 100 mL via INTRAVENOUS

## 2017-06-11 NOTE — Progress Notes (Signed)
Patient ID: Ricardo Nash, male   DOB: 1945/09/23, 72 y.o.   MRN: [id number]   Subjective:    Patient ID: Ricardo Nash, male    DOB: 08/08/1945, 72 y.o.   MRN: [id number]  HPI  Patient here as a work in with concerns regarding abdominal pain and nausea.  He has had issues with increased bloating and gas.  States has been worse after Christmas.  States will have episodes of constipation and then diarrhea.  Increased urgency at times.  Some nausea.  States four days ago, had increased pain.  Localized to LLQ.  States could not straighten up.  One day of emesis.  Increased distension.  Eased some the next day.  Feels better today, but still with some increased discomfort.  Also reports getting full fast.  Taking a probiotic.  Takes one stool softener per day.  No fever.  Eating.  Sugars doing ok.     Past Medical History:  Diagnosis Date  . C. difficile colitis   . Depression   . Diabetes mellitus ([place])   . Diverticulosis   . Environmental allergies   . GERD (gastroesophageal reflux disease)   . Goiter    intrathoracic, s/p benign biopsy (Dr Harlow Asa)  . Hypercholesterolemia   . Hypertension   . Osteoarthritis    cervical spine, lumbar spine  . Paroxysmal atrial fibrillation ([facility])   . TIA (transient ischemic attack)    Past Surgical History:  Procedure Laterality Date  . BACK SURGERY  8/08   s/p fusion of L4-L5  . CATARACT EXTRACTION  2011   Dr. Herbert Deaner  . [place] SURGERY  2002  . eyelid reduction     Dr. Carlis Abbott  . INGUINAL HERNIA REPAIR  1991   Dr, Tamala Julian  . NOSE SURGERY     turbinate reduction  . SEPTOPLASTY  1975  . SHOULDER SURGERY  2000   rotator cuff   Family History  Problem Relation Age of Onset  . Congestive Heart Failure Father   . Heart disease Father        myocardial infarction  . Rheumatic fever Father        valvular disease  . Heart disease Mother        s/p CABG (age 18)  . Kidney disease Sister   . Colon cancer Neg Hx   . Prostate cancer  Neg Hx    Social History   Socioeconomic History  . Marital status: Married    Spouse name: None  . Number of children: 2  . Years of education: None  . Highest education level: None  Social Needs  . Financial resource strain: None  . Food insecurity - worry: None  . Food insecurity - inability: None  . Transportation needs - medical: None  . Transportation needs - non-medical: None  Occupational History  . Occupation: retired Pharmacist, hospital  Tobacco Use  . Smoking status: Former Research scientist (life sciences)  . Smokeless tobacco: Never Used  Substance and Sexual Activity  . Alcohol use: Yes    Alcohol/week: 0.0 oz    Comment: occasional  . Drug use: No  . Sexual activity: Not Currently  Other Topics Concern  . None  Social History Narrative  . None    Outpatient Encounter Medications as of 06/11/2017  Medication Sig  . ACCU-CHEK AVIVA PLUS test strip USE TO TEST BLOOD SUGAR TWICE DAILY  . ALPRAZolam (XANAX) 0.25 MG tablet Take 1 tablet (0.25 mg total) by mouth daily as needed.  Marland Kitchen amLODipine (  NORVASC) 10 MG tablet TAKE 1 TABLET BY MOUTH ONCE DAILY  . aspirin 81 MG tablet Take 81 mg by mouth daily.  Marland Kitchen atorvastatin (LIPITOR) 10 MG tablet TAKE 1 TABLET BY MOUTH ONCE DAILY  . Azelastine-Fluticasone (DYMISTA NA) Place 2 puffs into the nose daily.  . clopidogrel (PLAVIX) 75 MG tablet Take 1 tablet (75 mg total) by mouth daily.  . colchicine 0.6 MG tablet Take 1 tablet (0.6 mg total) by mouth 2 (two) times daily.  Marland Kitchen glipiZIDE (GLUCOTROL XL) 2.5 MG 24 hr tablet TAKE 3 TABLETS BY MOUTH ONCE EVERY MORNING  . hydrochlorothiazide (HYDRODIURIL) 25 MG tablet TAKE 2 TABLETS BY MOUTH ONCE DAILY  . levocetirizine (XYZAL) 5 MG tablet Take 5 mg by mouth every evening.  Marland Kitchen lisinopril (PRINIVIL,ZESTRIL) 40 MG tablet TAKE 1 TABLET BY MOUTH ONCE DAILY.  . metFORMIN (GLUCOPHAGE) 1000 MG tablet Take 1 tablet (1,000 mg total) by mouth 2 (two) times daily with a meal.  . metoprolol succinate (TOPROL-XL) 25 MG 24 hr tablet TAKE  1 TABLET BY MOUTH TWICE DAILY  . montelukast (SINGULAIR) 10 MG tablet Take 10 mg by mouth at bedtime.  . Multiple Vitamin (MULTIVITAMIN) tablet Take 1 tablet by mouth daily.  . Omega-3 Fatty Acids (FISH OIL) 1200 MG CAPS Take by mouth 4 (four) times daily.  . pantoprazole (PROTONIX) 40 MG tablet TAKE 1 TABLET BY MOUTH ONCE DAILY  . pioglitazone (ACTOS) 30 MG tablet TAKE 1 TABLET BY MOUTH ONCE DAILY  . PROAIR HFA 108 (90 Base) MCG/ACT inhaler INHALE 2 PUFFS INTO THE LUNGS EVERY 6 HOURS IF NEEDED FOR WHEEZING ORSHORTNESS OF BREATH  . sildenafil (REVATIO) 20 MG tablet Take 20 mg by mouth as needed.  . tamsulosin (FLOMAX) 0.4 MG CAPS capsule Take 0.4 mg by mouth daily with breakfast.   . testosterone cypionate (DEPOTESTOTERONE CYPIONATE) 200 MG/ML injection Inject 200 mg into the muscle every 14 (fourteen) days.  . traZODone (DESYREL) 50 MG tablet TAKE 1&1/2 TABLETS BY MOUTH AT BEDTIME   Facility-Administered Encounter Medications as of 06/11/2017  Medication  . methylPREDNISolone acetate (DEPO-MEDROL) injection 40 mg    Review of Systems  Constitutional: Negative for appetite change and fever.  HENT: Negative for congestion and sinus pressure.   Respiratory: Negative for cough, chest tightness and shortness of breath.   Cardiovascular: Negative for chest pain, palpitations and leg swelling.  Gastrointestinal: Positive for abdominal pain, constipation, diarrhea, nausea and vomiting.  Genitourinary: Negative for difficulty urinating and dysuria.  Musculoskeletal: Negative for joint swelling and myalgias.  Skin: Negative for color change and rash.  Neurological: Negative for dizziness and headaches.  Psychiatric/Behavioral: Negative for agitation and dysphoric mood.       Objective:    Physical Exam  Constitutional: He appears well-developed and well-nourished. No distress.  HENT:  Nose: Nose normal.  Mouth/Throat: Oropharynx is clear and moist.  Neck: Neck supple. No thyromegaly  present.  Cardiovascular: Normal rate and regular rhythm.  Pulmonary/Chest: Effort normal and breath sounds normal. No respiratory distress.  Abdominal: Soft. Bowel sounds are normal.  Tenderness to palpation over the lower abdomen.  Worse LLQ.    Musculoskeletal: He exhibits no edema or tenderness.  Lymphadenopathy:    He has no cervical adenopathy.  Skin: No rash noted. No erythema.  Psychiatric: He has a normal mood and affect. His behavior is normal.    BP (!) 162/82 (BP Location: Left Arm, Patient Position: Sitting, Cuff Size: Large)   Pulse 71   Temp 98.7 F (37.1  C) (Oral)   Resp 18   Wt 225 lb 9.6 oz (102.3 kg)   SpO2 96%   BMI 34.30 kg/m  Wt Readings from Last 3 Encounters:  06/11/17 225 lb 9.6 oz (102.3 kg)  06/08/17 223 lb 6.4 oz (101.3 kg)  04/15/17 224 lb 2 oz (101.7 kg)     Lab Results  Component Value Date   WBC 8.2 06/11/2017   HGB 15.5 06/11/2017   HCT 45.9 06/11/2017   PLT 258.0 06/11/2017   GLUCOSE 150 (H) 06/11/2017   CHOL 138 02/05/2017   TRIG 258.0 (H) 02/05/2017   HDL 31.80 (L) 02/05/2017   LDLDIRECT 77.0 02/05/2017   LDLCALC 58 11/21/2013   ALT 20 06/11/2017   AST 17 06/11/2017   NA 141 06/11/2017   K 4.1 06/11/2017   CL 103 06/11/2017   CREATININE 1.00 06/11/2017   BUN 18 06/11/2017   CO2 29 06/11/2017   TSH 0.45 06/05/2016   PSA 0.4 08/11/2014   HGBA1C 7.1 (H) 02/05/2017   MICROALBUR 6.4 (H) 12/08/2016       Assessment & Plan:   Problem List Items Addressed This Visit    Abdominal pain    Increased pain and bloating, diarrhea and nausea as outlined.  Worsened recently.  Obtain labs, including cbc and liver function tests.  Check abdominal and pelvic CT.  May need GI evaluation.  Pain has improved.  Hold on abx.        Relevant Orders   CT Abdomen Pelvis W Contrast (Completed)   CT Abdomen Pelvis W Contrast (Completed)   CBC with Differential/Platelet (Completed)   Hepatic function panel (Completed)   Basic metabolic panel  (Completed)   Diabetes mellitus ([place])    Follow met b and a1c.  Sugars doing well per pt.        Relevant Orders   I-STAT Creatinine ([facility] manual entry)   Creatinine   Hypertension    Blood pressure elevated today.  Feel related to current issues.  He states has been under reasonable control.  Follow pressures.  Hold on changing medication.  Follow.        Other Visit Diagnoses    Non-intractable vomiting with nausea, unspecified vomiting type    -  Primary   Relevant Orders   I-STAT Creatinine ([facility] manual entry)   Creatinine   Diarrhea, unspecified type       Relevant Orders   CT Abdomen Pelvis W Contrast (Completed)   I-STAT Creatinine ([facility] manual entry)   Creatinine       Einar Pheasant, MD

## 2017-06-12 LAB — CBC WITH DIFFERENTIAL/PLATELET
BASOS ABS: 0.1 10*3/uL (ref 0.0–0.1)
Basophils Relative: 1.1 % (ref 0.0–3.0)
EOS ABS: 0.1 10*3/uL (ref 0.0–0.7)
Eosinophils Relative: 0.9 % (ref 0.0–5.0)
HCT: 45.9 % (ref 39.0–52.0)
Hemoglobin: 15.5 g/dL (ref 13.0–17.0)
LYMPHS ABS: 1.7 10*3/uL (ref 0.7–4.0)
Lymphocytes Relative: 20.8 % (ref 12.0–46.0)
MCHC: 33.7 g/dL (ref 30.0–36.0)
MCV: 88.3 fl (ref 78.0–100.0)
MONO ABS: 0.5 10*3/uL (ref 0.1–1.0)
Monocytes Relative: 5.7 % (ref 3.0–12.0)
NEUTROS PCT: 71.5 % (ref 43.0–77.0)
Neutro Abs: 5.9 10*3/uL (ref 1.4–7.7)
Platelets: 258 10*3/uL (ref 150.0–400.0)
RBC: 5.2 Mil/uL (ref 4.22–5.81)
RDW: 13.9 % (ref 11.5–15.5)
WBC: 8.2 10*3/uL (ref 4.0–10.5)

## 2017-06-12 LAB — BASIC METABOLIC PANEL
BUN: 18 mg/dL (ref 6–23)
CALCIUM: 9.7 mg/dL (ref 8.4–10.5)
CO2: 29 meq/L (ref 19–32)
Chloride: 103 mEq/L (ref 96–112)
Creatinine, Ser: 1.06 mg/dL (ref 0.40–1.50)
GFR: 72.78 mL/min (ref >60.00)
Glucose, Bld: 150 mg/dL — ABNORMAL HIGH (ref 70–99)
Potassium: 4.1 mEq/L (ref 3.5–5.1)
SODIUM: 141 meq/L (ref 135–145)

## 2017-06-12 LAB — HEPATIC FUNCTION PANEL
ALK PHOS: 44 U/L (ref 39–117)
ALT: 20 U/L (ref 0–53)
AST: 17 U/L (ref 0–37)
Albumin: 4.3 g/dL (ref 3.5–5.2)
BILIRUBIN TOTAL: 0.6 mg/dL (ref 0.2–1.2)
Bilirubin, Direct: 0.1 mg/dL (ref 0.0–0.3)
Total Protein: 7.2 g/dL (ref 6.0–8.3)

## 2017-06-14 ENCOUNTER — Encounter: Payer: Self-pay | Admitting: Internal Medicine

## 2017-06-14 NOTE — Assessment & Plan Note (Signed)
Follow met b and a1c.  Sugars doing well per pt.

## 2017-06-14 NOTE — Assessment & Plan Note (Signed)
Increased pain and bloating, diarrhea and nausea as outlined.  Worsened recently.  Obtain labs, including cbc and liver function tests.  Check abdominal and pelvic CT.  May need GI evaluation.  Pain has improved.  Hold on abx.

## 2017-06-14 NOTE — Assessment & Plan Note (Signed)
Blood pressure elevated today.  Feel related to current issues.  He states has been under reasonable control.  Follow pressures.  Hold on changing medication.  Follow.

## 2017-06-24 ENCOUNTER — Telehealth: Payer: Self-pay | Admitting: Internal Medicine

## 2017-06-24 NOTE — Telephone Encounter (Signed)
Where would you like to work  Him in?

## 2017-06-24 NOTE — Telephone Encounter (Signed)
Is pt doing ok.  Have him update [place] after his GI appt and we will get a f/u appt scheduled for him.  Thanks

## 2017-06-24 NOTE — Telephone Encounter (Signed)
Patient stated he misunderstood and thought he needed to see you. Patient has an appt with GI tomorrow

## 2017-06-24 NOTE — Telephone Encounter (Signed)
This is the pt that I gave you a note to notify him of GI appt scheduled.  I had discussed the need for GI f/u with him.  Has he seen GI for the f/u on the scan?  I do not mind seeing him and can find a place, but just wanted him to f/u with GI first.   Thanks

## 2017-06-24 NOTE — Telephone Encounter (Signed)
Copied from [place]. Topic: Appointment Scheduling - Scheduling Inquiry for Clinic >> Jun 24, 2017  1:30 PM Cecelia Byars, [place] wrote: Reason for CRM: Patient needs to f/u with Dr Nicki Reaper concerning a scan he had done at the hospital on 06/11/17 ,there is no availability on her schedule ,he prefers to see Dr Nicki Reaper  she also told him to come in for this visit ,he says she told him someone would call to schedule him ,please advise   [phone] or [phone]

## 2017-06-25 DIAGNOSIS — R194 Change in bowel habit: Secondary | ICD-10-CM | POA: Insufficient documentation

## 2017-06-25 NOTE — Telephone Encounter (Signed)
Patient confirmed doing ok when I spoke with him yesterday and he stated he would call us after appt with GI

## 2017-07-17 ENCOUNTER — Encounter: Payer: Self-pay | Admitting: *Deleted

## 2017-07-20 ENCOUNTER — Ambulatory Visit: Payer: Medicare Other | Admitting: Certified Registered Nurse Anesthetist

## 2017-07-20 ENCOUNTER — Other Ambulatory Visit: Payer: Self-pay

## 2017-07-20 ENCOUNTER — Encounter: Payer: Self-pay | Admitting: Certified Registered Nurse Anesthetist

## 2017-07-20 ENCOUNTER — Encounter
Admission: RE | Disposition: A | Discharge status: Home / Self Care | Payer: Self-pay | Source: Ambulatory Visit | Attending: Unknown Physician Specialty

## 2017-07-20 ENCOUNTER — Ambulatory Visit
Admission: RE | Admit: 2017-07-20 | Discharge: 2017-07-20 | Disposition: A | Discharge status: Home / Self Care | Payer: Medicare Other | Source: Ambulatory Visit | Attending: Unknown Physician Specialty | Admitting: Unknown Physician Specialty

## 2017-07-20 DIAGNOSIS — Z1211 Encounter for screening for malignant neoplasm of colon: Secondary | ICD-10-CM | POA: Insufficient documentation

## 2017-07-20 DIAGNOSIS — F329 Major depressive disorder, single episode, unspecified: Secondary | ICD-10-CM | POA: Insufficient documentation

## 2017-07-20 DIAGNOSIS — K640 First degree hemorrhoids: Secondary | ICD-10-CM | POA: Diagnosis not present

## 2017-07-20 DIAGNOSIS — I10 Essential (primary) hypertension: Secondary | ICD-10-CM | POA: Insufficient documentation

## 2017-07-20 DIAGNOSIS — E119 Type 2 diabetes mellitus without complications: Secondary | ICD-10-CM | POA: Diagnosis not present

## 2017-07-20 DIAGNOSIS — Z8673 Personal history of transient ischemic attack (TIA), and cerebral infarction without residual deficits: Secondary | ICD-10-CM | POA: Diagnosis not present

## 2017-07-20 DIAGNOSIS — Z7984 Long term (current) use of oral hypoglycemic drugs: Secondary | ICD-10-CM | POA: Diagnosis not present

## 2017-07-20 DIAGNOSIS — E7800 Pure hypercholesterolemia, unspecified: Secondary | ICD-10-CM | POA: Insufficient documentation

## 2017-07-20 DIAGNOSIS — Z87891 Personal history of nicotine dependence: Secondary | ICD-10-CM | POA: Diagnosis not present

## 2017-07-20 DIAGNOSIS — Z7982 Long term (current) use of aspirin: Secondary | ICD-10-CM | POA: Insufficient documentation

## 2017-07-20 DIAGNOSIS — D120 Benign neoplasm of cecum: Secondary | ICD-10-CM | POA: Insufficient documentation

## 2017-07-20 DIAGNOSIS — I2510 Atherosclerotic heart disease of native coronary artery without angina pectoris: Secondary | ICD-10-CM | POA: Insufficient documentation

## 2017-07-20 DIAGNOSIS — K5730 Diverticulosis of large intestine without perforation or abscess without bleeding: Secondary | ICD-10-CM | POA: Insufficient documentation

## 2017-07-20 DIAGNOSIS — K5900 Constipation, unspecified: Secondary | ICD-10-CM | POA: Insufficient documentation

## 2017-07-20 DIAGNOSIS — K219 Gastro-esophageal reflux disease without esophagitis: Secondary | ICD-10-CM | POA: Diagnosis not present

## 2017-07-20 DIAGNOSIS — G4730 Sleep apnea, unspecified: Secondary | ICD-10-CM | POA: Insufficient documentation

## 2017-07-20 DIAGNOSIS — I480 Paroxysmal atrial fibrillation: Secondary | ICD-10-CM | POA: Diagnosis not present

## 2017-07-20 DIAGNOSIS — Z79899 Other long term (current) drug therapy: Secondary | ICD-10-CM | POA: Insufficient documentation

## 2017-07-20 HISTORY — DX: Atherosclerotic heart disease of native coronary artery without angina pectoris: I25.10

## 2017-07-20 HISTORY — DX: Diverticulitis of intestine, part unspecified, without perforation or abscess without bleeding: K57.92

## 2017-07-20 HISTORY — DX: Nausea with vomiting, unspecified: R11.2

## 2017-07-20 HISTORY — DX: Cerebral infarction, unspecified: I63.9

## 2017-07-20 HISTORY — PX: COLONOSCOPY WITH PROPOFOL: SHX5780

## 2017-07-20 HISTORY — DX: Sleep apnea, unspecified: G47.30

## 2017-07-20 HISTORY — DX: Other specified postprocedural states: Z98.890

## 2017-07-20 LAB — GLUCOSE, CAPILLARY: GLUCOSE-CAPILLARY: 178 mg/dL — AB (ref 65–99)

## 2017-07-20 SURGERY — COLONOSCOPY WITH PROPOFOL
Anesthesia: General

## 2017-07-20 MED ORDER — SODIUM CHLORIDE 0.9 % IV SOLN: INTRAVENOUS | Status: DC

## 2017-07-20 MED ORDER — PROPOFOL 500 MG/50ML IV EMUL
INTRAVENOUS | Status: AC
  Filled 2017-07-20: qty 50

## 2017-07-20 MED ORDER — LIDOCAINE HCL (PF) 2 % IJ SOLN
INTRAMUSCULAR | Status: AC
  Filled 2017-07-20: qty 10

## 2017-07-20 MED ORDER — SODIUM CHLORIDE 0.9 % IV SOLN
INTRAVENOUS | Status: DC
  Administered 2017-07-20: 1000 mL via INTRAVENOUS

## 2017-07-20 MED ORDER — LIDOCAINE HCL (PF) 1 % IJ SOLN
INTRAMUSCULAR | Status: AC
  Administered 2017-07-20: 0.3 mL
  Filled 2017-07-20: qty 2

## 2017-07-20 MED ORDER — LIDOCAINE HCL (CARDIAC) 20 MG/ML IV SOLN
INTRAVENOUS | Status: DC | PRN
  Administered 2017-07-20: 50 mg via INTRAVENOUS

## 2017-07-20 MED ORDER — PROPOFOL 500 MG/50ML IV EMUL
INTRAVENOUS | Status: DC | PRN
  Administered 2017-07-20: 130 ug/kg/min via INTRAVENOUS

## 2017-07-20 MED ORDER — PROPOFOL 10 MG/ML IV BOLUS
INTRAVENOUS | Status: DC | PRN
  Administered 2017-07-20: 100 mg via INTRAVENOUS

## 2017-07-20 NOTE — Anesthesia Preprocedure Evaluation (Signed)
Anesthesia Evaluation  Patient identified by MRN, date of birth, ID band Patient awake    Reviewed: Allergy & Precautions, NPO status , Patient's Chart, lab work & pertinent test results, reviewed documented beta blocker date and time   History of Anesthesia Complications (+) PONV and history of anesthetic complications  Airway Mallampati: III  TM Distance: <3 FB     Dental  (+) Chipped, Caps   Pulmonary sleep apnea , former smoker,    Pulmonary exam normal        Cardiovascular hypertension, Pt. on medications and Pt. on home beta blockers + CAD  Normal cardiovascular exam     Neuro/Psych PSYCHIATRIC DISORDERS Depression TIACVA    GI/Hepatic Neg liver ROS, GERD  ,  Endo/Other  diabetes  Renal/GU negative Renal ROS     Musculoskeletal  (+) Arthritis , Osteoarthritis,    Abdominal Normal abdominal exam  (+)   Peds negative pediatric ROS (+)  Hematology  (+) anemia ,   Anesthesia Other Findings   Reproductive/Obstetrics                             Anesthesia Physical Anesthesia Plan  ASA: III  Anesthesia Plan: General   Post-op Pain Management:    Induction: Intravenous  PONV Risk Score and Plan:   Airway Management Planned: Nasal Cannula  Additional Equipment:   Intra-op Plan:   Post-operative Plan:   Informed Consent: I have reviewed the patients History and Physical, chart, labs and discussed the procedure including the risks, benefits and alternatives for the proposed anesthesia with the patient or authorized representative who has indicated his/her understanding and acceptance.   Dental advisory given  Plan Discussed with: CRNA and Surgeon  Anesthesia Plan Comments:         Anesthesia Quick Evaluation

## 2017-07-20 NOTE — Anesthesia Post-op Follow-up Note (Signed)
Anesthesia QCDR form completed.        

## 2017-07-20 NOTE — Transfer of Care (Signed)
Immediate Anesthesia Transfer of Care Note  Patient: Ricardo Nash  Procedure(s) Performed: COLONOSCOPY WITH PROPOFOL (N/A )  Patient Location: PACU and Endoscopy Unit  Anesthesia Type:General  Level of Consciousness: drowsy  Airway & Oxygen Therapy: Patient Spontanous Breathing and Patient connected to nasal cannula oxygen  Post-op Assessment: Report given to RN and Post -op Vital signs reviewed and stable  Post vital signs: Reviewed and stable  Last Vitals:  Vitals:   07/20/17 1045  BP: (!) 146/59  Pulse: 62  Resp: 18  Temp: (!) 35.6 C  SpO2: 99%    Last Pain:  Vitals:   07/20/17 1045  TempSrc: Tympanic         Complications: No apparent anesthesia complications

## 2017-07-20 NOTE — H&P (Signed)
Primary Care Physician:  Einar Pheasant, MD Primary Gastroenterologist:  Dr. Vira Agar  Pre-Procedure History & Physical: HPI:  Ricardo Nash is a 72 y.o. male is here for an colonoscopy for constipation.   Past Medical History:  Diagnosis Date  . C. difficile colitis   . Coronary artery disease   . Depression   . Diabetes mellitus ([place])   . Diverticulitis   . Diverticulosis   . Environmental allergies   . GERD (gastroesophageal reflux disease)   . Goiter    intrathoracic, s/p benign biopsy (Dr Harlow Asa)  . Hypercholesterolemia   . Hypertension   . Osteoarthritis    cervical spine, lumbar spine  . Paroxysmal atrial fibrillation ([facility])   . PONV (postoperative nausea and vomiting)   . Sleep apnea   . Stroke ([place])   . TIA (transient ischemic attack)     Past Surgical History:  Procedure Laterality Date  . BACK SURGERY  8/08   s/p fusion of L4-L5  . capsule endoscopy    . CATARACT EXTRACTION  2011   Dr. Herbert Deaner  . [place] SURGERY  2002  . EYE SURGERY     cataract bilateral 10/1999  . eyelid reduction     Dr. Carlis Abbott  . INGUINAL HERNIA REPAIR  1991   Dr, Tamala Julian  . NOSE SURGERY     turbinate reduction  . SEPTOPLASTY  1975  . SHOULDER SURGERY  2000   rotator cuff    Prior to Admission medications   Medication Sig Start Date End Date Taking? Authorizing Provider  ACCU-CHEK AVIVA PLUS test strip USE TO TEST BLOOD SUGAR TWICE DAILY 04/09/17  Yes Einar Pheasant, MD  ALPRAZolam Duanne Moron) 0.25 MG tablet Take 1 tablet (0.25 mg total) by mouth daily as needed. 04/15/17  Yes Einar Pheasant, MD  amLODipine (NORVASC) 10 MG tablet TAKE 1 TABLET BY MOUTH ONCE DAILY 06/10/17  Yes Einar Pheasant, MD  aspirin 81 MG tablet Take 81 mg by mouth daily.   Yes [provider]  atorvastatin (LIPITOR) 10 MG tablet TAKE 1 TABLET BY MOUTH ONCE DAILY 08/01/16  Yes Einar Pheasant, MD  Azelastine-Fluticasone (DYMISTA NA) Place 2 puffs into the nose daily.   Yes [provider]  clopidogrel (PLAVIX) 75 MG tablet Take 1 tablet (75 mg total) by mouth daily. 12/10/16  Yes Einar Pheasant, MD  hydrochlorothiazide (HYDRODIURIL) 25 MG tablet TAKE 2 TABLETS BY MOUTH ONCE DAILY 06/10/17  Yes Einar Pheasant, MD  levocetirizine (XYZAL) 5 MG tablet Take 5 mg by mouth every evening.   Yes Mosetta Anis, MD  lisinopril (PRINIVIL,ZESTRIL) 40 MG tablet TAKE 1 TABLET BY MOUTH ONCE DAILY. 08/01/16  Yes Einar Pheasant, MD  metoprolol succinate (TOPROL-XL) 25 MG 24 hr tablet TAKE 1 TABLET BY MOUTH TWICE DAILY 12/26/16  Yes Einar Pheasant, MD  montelukast (SINGULAIR) 10 MG tablet Take 10 mg by mouth at bedtime.   Yes [provider]  pantoprazole (PROTONIX) 40 MG tablet TAKE 1 TABLET BY MOUTH ONCE DAILY 09/15/16  Yes Einar Pheasant, MD  testosterone cypionate (DEPOTESTOTERONE CYPIONATE) 200 MG/ML injection Inject 200 mg into the muscle every 14 (fourteen) days. 08/17/14  Yes [provider]  traZODone (DESYREL) 50 MG tablet TAKE 1&1/2 TABLETS BY MOUTH AT BEDTIME 04/22/17  Yes Einar Pheasant, MD  colchicine 0.6 MG tablet Take 1 tablet (0.6 mg total) by mouth 2 (two) times daily. Patient not taking: Reported on 07/20/2017 02/13/17   Einar Pheasant, MD  fluticasone [place]) 50 MCG/ACT nasal spray Place  into both nostrils daily.    [provider]  glipiZIDE (GLUCOTROL XL) 2.5 MG 24 hr tablet TAKE 3 TABLETS BY MOUTH ONCE EVERY MORNING 03/24/17   Einar Pheasant, MD  metFORMIN (GLUCOPHAGE) 1000 MG tablet Take 1 tablet (1,000 mg total) by mouth 2 (two) times daily with a meal. 12/10/16   Einar Pheasant, MD  Multiple Vitamin (MULTIVITAMIN) tablet Take 1 tablet by mouth daily.    [provider]  Omega-3 Fatty Acids (FISH OIL) 1200 MG CAPS Take by mouth 4 (four) times daily.    [provider]  pioglitazone (ACTOS) 30 MG tablet TAKE 1 TABLET BY MOUTH ONCE DAILY 03/24/17   Einar Pheasant, MD  PROAIR HFA 108 [id number] Base) MCG/ACT inhaler INHALE 2  PUFFS INTO THE LUNGS EVERY 6 HOURS IF NEEDED FOR WHEEZING ORSHORTNESS OF BREATH Patient not taking: Reported on 07/20/2017 04/22/17   Einar Pheasant, MD  sildenafil (REVATIO) 20 MG tablet Take 20 mg by mouth as needed.    [provider]  tamsulosin (FLOMAX) 0.4 MG CAPS capsule Take 0.4 mg by mouth daily with breakfast.  03/24/14   [provider]    Allergies as of 06/27/2017 - Review Complete 06/14/2017  Allergen Reaction Noted  . Penicillins  04/02/2012    Family History  Problem Relation Age of Onset  . Congestive Heart Failure Father   . Heart disease Father        myocardial infarction  . Rheumatic fever Father        valvular disease  . Heart disease Mother        s/p CABG (age 67)  . Kidney disease Sister   . Colon cancer Neg Hx   . Prostate cancer Neg Hx     Social History   Socioeconomic History  . Marital status: Married    Spouse name: Not on file  . Number of children: 2  . Years of education: Not on file  . Highest education level: Not on file  Social Needs  . Financial resource strain: Not on file  . Food insecurity - worry: Not on file  . Food insecurity - inability: Not on file  . Transportation needs - medical: Not on file  . Transportation needs - non-medical: Not on file  Occupational History  . Occupation: retired Pharmacist, hospital  Tobacco Use  . Smoking status: Former Research scientist (life sciences)  . Smokeless tobacco: Never Used  Substance and Sexual Activity  . Alcohol use: Yes    Alcohol/week: 0.0 oz    Comment: occasional  . Drug use: No  . Sexual activity: Not Currently  Other Topics Concern  . Not on file  Social History Narrative  . Not on file    Review of Systems: See HPI, otherwise negative ROS  Physical Exam: BP (!) 146/59   Pulse 62   Temp (!) 96.1 F (35.6 C) (Tympanic)   Resp 18   Ht 5\' 9"  (1.753 m)   Wt 100.7 kg (222 lb)   SpO2 99%   BMI 32.78 kg/m  General:   Alert,  pleasant and cooperative in NAD Head:  Normocephalic and  atraumatic. Neck:  Supple; no masses or thyromegaly. Lungs:  Clear throughout to auscultation.    Heart:  Regular rate and rhythm. Abdomen:  Soft, nontender and nondistended. Normal bowel sounds, without guarding, and without rebound.   Neurologic:  Alert and  oriented x4;  grossly normal neurologically.  Impression/Plan: Ricardo Nash is here for an colonoscopy to be performed for constipation  Risks, benefits, limitations, and alternatives regarding  colonoscopy have been reviewed with the patient.  Questions have been answered.  All parties agreeable.   Gaylyn Cheers, MD  07/20/2017, 11:33 AM

## 2017-07-20 NOTE — Anesthesia Postprocedure Evaluation (Signed)
Anesthesia Post Note  Patient: Ricardo Nash  Procedure(s) Performed: COLONOSCOPY WITH PROPOFOL (N/A )  Patient location during evaluation: PACU Anesthesia Type: General Level of consciousness: awake and alert and oriented Pain management: pain level controlled Vital Signs Assessment: post-procedure vital signs reviewed and stable Respiratory status: spontaneous breathing Cardiovascular status: blood pressure returned to baseline Anesthetic complications: no     Last Vitals:  Vitals:   07/20/17 1240 07/20/17 1250  BP: (!) 149/70 135/60  Pulse: (!) 56 60  Resp: 10 14  Temp:    SpO2: 99% 98%    Last Pain:  Vitals:   07/20/17 1210  TempSrc: Tympanic                 Caelan Atchley

## 2017-07-20 NOTE — Op Note (Signed)
[facility] Gastroenterology Patient Name: Ricardo Nash Procedure Date: 07/20/2017 11:33 AM MRN: [id number] Account #: [id number] Date of Birth: May 22, 1945 Admit Type: Outpatient Age: 72 Room: [facility] ENDO ROOM 3 Gender: Male Note Status: Finalized Procedure:            Colonoscopy Indications:          Screening for colorectal malignant neoplasm Providers:            Manya Silvas, MD Referring MD:         Einar Pheasant, MD (Referring MD) Medicines:            Propofol per Anesthesia Complications:        No immediate complications. Procedure:            Pre-Anesthesia Assessment:                       - After reviewing the risks and benefits, the patient                        was deemed in satisfactory condition to undergo the                        procedure.                       After obtaining informed consent, the colonoscope was                        passed under direct vision. Throughout the procedure,                        the patient's blood pressure, pulse, and oxygen                        saturations were monitored continuously. The                        Colonoscope was introduced through the anus and                        advanced to the the cecum, identified by appendiceal                        orifice and ileocecal valve. The colonoscopy was                        performed without difficulty. The patient tolerated the                        procedure well. The quality of the bowel preparation                        was adequate to identify polyps 6 mm and larger in size. Findings:      Biopsies done in sigmoid colon to look for microscopic colitis.      A diminutive polyp was found in the cecum. The polyp was sessile. The       polyp was removed with a jumbo cold forceps. Resection and retrieval       were complete.      Multiple small and large-mouthed diverticula were found in the sigmoid  colon and descending colon.  Internal hemorrhoids were found during endoscopy. The hemorrhoids were       small, medium-sized and Grade I (internal hemorrhoids that do not       prolapse).      The exam was otherwise without abnormality. Impression:           - One diminutive polyp in the cecum, removed with a                        jumbo cold forceps. Resected and retrieved.                       - Diverticulosis in the sigmoid colon and in the                        descending colon.                       - Internal hemorrhoids.                       - The examination was otherwise normal. Recommendation:       - Await pathology results. Manya Silvas, MD 07/20/2017 12:08:23 PM This report has been signed electronically. Number of Addenda: 0 Note Initiated On: 07/20/2017 11:33 AM Scope Withdrawal Time: 0 hours 9 minutes 53 seconds  Total Procedure Duration: 0 hours 19 minutes 31 seconds       [facility]

## 2017-07-21 ENCOUNTER — Encounter: Payer: Self-pay | Admitting: Unknown Physician Specialty

## 2017-07-21 LAB — SURGICAL PATHOLOGY

## 2017-07-22 ENCOUNTER — Other Ambulatory Visit: Payer: Self-pay | Admitting: Internal Medicine

## 2017-08-04 ENCOUNTER — Other Ambulatory Visit: Payer: Self-pay

## 2017-08-06 ENCOUNTER — Other Ambulatory Visit: Payer: Self-pay | Admitting: Internal Medicine

## 2017-08-17 ENCOUNTER — Other Ambulatory Visit: Payer: Self-pay | Admitting: Internal Medicine

## 2017-09-17 ENCOUNTER — Other Ambulatory Visit: Payer: Self-pay | Admitting: Internal Medicine

## 2017-09-17 DIAGNOSIS — I10 Essential (primary) hypertension: Secondary | ICD-10-CM

## 2017-10-14 ENCOUNTER — Encounter: Payer: Self-pay | Admitting: Internal Medicine

## 2017-10-14 DIAGNOSIS — M5442 Lumbago with sciatica, left side: Secondary | ICD-10-CM

## 2017-10-14 NOTE — Telephone Encounter (Signed)
Patient stated he does not want to see another GI MD. He would like an appt with you. Nothing acute going on right now but would like to try to move his appt up with you so he can be seen sooner than August. Patient stated that he had 5 bowel movements yesterday and 3 so far today. Also patient is requesting referral to PT at [facility] in [place].  Pt needs script for Xanax as well

## 2017-10-15 ENCOUNTER — Telehealth: Payer: Self-pay

## 2017-10-15 MED ORDER — ALPRAZOLAM 0.25 MG PO TABS: 0.2500 mg | ORAL_TABLET | Freq: Every day | ORAL | 1 refills | Status: DC | PRN

## 2017-10-15 NOTE — Telephone Encounter (Signed)
Pt rescheduled for 11/16/17

## 2017-10-15 NOTE — Telephone Encounter (Signed)
Copied from [place] [phone]. Topic: Inquiry >> Oct 15, 2017 11:27 AM Vernona Rieger wrote: Reason for CRM: Larena Glassman, patient called back and said that July 1st does not work for him as he is out of town. He will back in town July 6th. Call back [phone] cell [phone]

## 2017-10-15 NOTE — Telephone Encounter (Signed)
Left message to call back  

## 2017-10-15 NOTE — Telephone Encounter (Signed)
rx ok'd for xanax #30 with one refill.  I have placed the order for physical therapy.  I can see him 11/09/17 - 11:30.  If needs earlier appt, let us know.

## 2017-10-15 NOTE — Telephone Encounter (Signed)
Noted  

## 2017-11-02 ENCOUNTER — Other Ambulatory Visit: Payer: Self-pay | Admitting: Internal Medicine

## 2017-11-09 ENCOUNTER — Ambulatory Visit: Payer: Medicare Other | Admitting: Internal Medicine

## 2017-11-16 ENCOUNTER — Encounter: Payer: Self-pay | Admitting: Internal Medicine

## 2017-11-16 ENCOUNTER — Other Ambulatory Visit: Payer: Self-pay | Admitting: Internal Medicine

## 2017-11-16 ENCOUNTER — Ambulatory Visit: Payer: Medicare Other | Admitting: Internal Medicine

## 2017-11-16 DIAGNOSIS — E119 Type 2 diabetes mellitus without complications: Secondary | ICD-10-CM

## 2017-11-16 DIAGNOSIS — D509 Iron deficiency anemia, unspecified: Secondary | ICD-10-CM | POA: Diagnosis not present

## 2017-11-16 DIAGNOSIS — K219 Gastro-esophageal reflux disease without esophagitis: Secondary | ICD-10-CM

## 2017-11-16 DIAGNOSIS — R55 Syncope and collapse: Secondary | ICD-10-CM | POA: Diagnosis not present

## 2017-11-16 DIAGNOSIS — G4733 Obstructive sleep apnea (adult) (pediatric): Secondary | ICD-10-CM | POA: Diagnosis not present

## 2017-11-16 DIAGNOSIS — E7800 Pure hypercholesterolemia, unspecified: Secondary | ICD-10-CM | POA: Diagnosis not present

## 2017-11-16 DIAGNOSIS — R197 Diarrhea, unspecified: Secondary | ICD-10-CM

## 2017-11-16 DIAGNOSIS — M5442 Lumbago with sciatica, left side: Secondary | ICD-10-CM | POA: Diagnosis not present

## 2017-11-16 DIAGNOSIS — Z6833 Body mass index (BMI) 33.0-33.9, adult: Secondary | ICD-10-CM

## 2017-11-16 DIAGNOSIS — I10 Essential (primary) hypertension: Secondary | ICD-10-CM | POA: Diagnosis not present

## 2017-11-16 MED ORDER — PANTOPRAZOLE SODIUM 20 MG PO TBEC: 20.0000 mg | DELAYED_RELEASE_TABLET | Freq: Every day | ORAL | 2 refills | Status: DC

## 2017-11-16 MED ORDER — TIZANIDINE HCL 4 MG PO TABS: 4.0000 mg | ORAL_TABLET | Freq: Every evening | ORAL | 0 refills | Status: DC | PRN

## 2017-11-16 NOTE — Progress Notes (Signed)
Patient ID: Ricardo Nash, male   DOB: 1946/03/24, 72 y.o.   MRN: [id number]   Subjective:    Patient ID: Ricardo Nash, male    DOB: 14-May-1945, 72 y.o.   MRN: [id number]  HPI  Patient here as a work in appt with concerns regarding persistent bowel issues - soft/loose stool.  Saw GI recently.  Had colonoscopy  07/20/17 - tubular adenoma. No colitis noted on biopsy.  States was told to take immodium and stool softener alternating pending symptoms.  He states his bowels are some better.  Still with soft/loose stool.  Previously had loss of control of bowel.  This has not occurred recently.  Intermittent increased bloating.  Does not necessarily correlate with eating.  No nausea or vomiting.  Only time felt nausea, was when he was constipated.  No acid reflux.  On protonix.  Taking a probiotic.  Was on abx in 06/2017 (Sulfa) for toe cellulitis.  This has resolved. Bowel symptoms started in 07/2017.  In 07/2017, he also aggravated his back.  Sat in one position, bent over - putting together a cedar chest.  When he stood up, felt all of his muscles - spasm.  Going to therapy.  Was helping.  His knee started hurting last week.  Increased swelling and warmth.  This has aggravated his back.  The knee pain is worse with weight bearing and stairs.  Also sore to palpation - over the knee cap.  Was seen at urgent care last week.  Was given a shot of cortisone and placed on prednisone taper.  Helped some the first couple of days, but states does not feel made a big difference overall.  He also reports he had a near syncopal episode 10/30/17.  States he mowed.  Came inside to rest.  When he stood back up, he noticed increased dizziness and near syncope.  Had to take it easy.  No chest pain.  No sob.  Symptoms resolved and have not reoccurred.  Blood pressure 80/40 when occurred.  Has leveled off now.     Past Medical History:  Diagnosis Date  . C. difficile colitis   . Coronary artery disease   . Depression   .  Diabetes mellitus ([place])   . Diverticulitis   . Diverticulosis   . Environmental allergies   . GERD (gastroesophageal reflux disease)   . Goiter    intrathoracic, s/p benign biopsy (Dr Harlow Asa)  . Hypercholesterolemia   . Hypertension   . Osteoarthritis    cervical spine, lumbar spine  . Paroxysmal atrial fibrillation ([facility])   . PONV (postoperative nausea and vomiting)   . Sleep apnea   . Stroke ([place])   . TIA (transient ischemic attack)    Past Surgical History:  Procedure Laterality Date  . BACK SURGERY  8/08   s/p fusion of L4-L5  . capsule endoscopy    . CATARACT EXTRACTION  2011   Dr. Herbert Deaner  . [place] SURGERY  2002  . COLONOSCOPY WITH PROPOFOL N/A 07/20/2017   Procedure: COLONOSCOPY WITH PROPOFOL;  Surgeon: Manya Silvas, MD;  Location: [facility] ENDOSCOPY;  Service: Endoscopy;  Laterality: N/A;  . EYE SURGERY     cataract bilateral 10/1999  . eyelid reduction     Dr. Carlis Abbott  . INGUINAL HERNIA REPAIR  1991   Dr, Tamala Julian  . NOSE SURGERY     turbinate reduction  . SEPTOPLASTY  1975  . SHOULDER SURGERY  2000   rotator cuff   Family  History  Problem Relation Age of Onset  . Congestive Heart Failure Father   . Heart disease Father        myocardial infarction  . Rheumatic fever Father        valvular disease  . Heart disease Mother        s/p CABG (age 49)  . Kidney disease Sister   . Colon cancer Neg Hx   . Prostate cancer Neg Hx    Social History   Socioeconomic History  . Marital status: Married    Spouse name: Not on file  . Number of children: 2  . Years of education: Not on file  . Highest education level: Not on file  Occupational History  . Occupation: retired Tour manager  . Financial resource strain: Not on file  . Food insecurity:    Worry: Not on file    Inability: Not on file  . Transportation needs:    Medical: Not on file    Non-medical: Not on file  Tobacco Use  . Smoking status: Former Research scientist (life sciences)  . Smokeless tobacco: Never  Used  Substance and Sexual Activity  . Alcohol use: Yes    Alcohol/week: 0.0 oz    Comment: occasional  . Drug use: No  . Sexual activity: Not Currently  Lifestyle  . Physical activity:    Days per week: Not on file    Minutes per session: Not on file  . Stress: Not on file  Relationships  . Social connections:    Talks on phone: Not on file    Gets together: Not on file    Attends religious service: Not on file    Active member of club or organization: Not on file    Attends meetings of clubs or organizations: Not on file    Relationship status: Not on file  Other Topics Concern  . Not on file  Social History Narrative  . Not on file    Outpatient Encounter Medications as of 11/16/2017  Medication Sig  . ACCU-CHEK AVIVA PLUS test strip USE TO TEST BLOOD SUGAR TWICE DAILY  . ALPRAZolam (XANAX) 0.25 MG tablet Take 1 tablet (0.25 mg total) by mouth daily as needed.  Marland Kitchen amLODipine (NORVASC) 10 MG tablet TAKE 1 TABLET BY MOUTH ONCE DAILY  . aspirin 81 MG tablet Take 81 mg by mouth daily.  Marland Kitchen atorvastatin (LIPITOR) 10 MG tablet TAKE 1 TABLET BY MOUTH ONCE DAILY  . Azelastine-Fluticasone (DYMISTA NA) Place 2 puffs into the nose daily.  . clopidogrel (PLAVIX) 75 MG tablet Take 1 tablet (75 mg total) by mouth daily.  . clopidogrel (PLAVIX) 75 MG tablet TAKE 1 TABLET BY MOUTH ONCE DAILY  . colchicine 0.6 MG tablet Take 1 tablet (0.6 mg total) by mouth 2 (two) times daily.  . fluticasone (FLONASE) 50 MCG/ACT nasal spray Place into both nostrils daily.  Marland Kitchen glipiZIDE (GLUCOTROL XL) 2.5 MG 24 hr tablet TAKE 3 TABLETS BY MOUTH ONCE EVERY MORNING  . hydrochlorothiazide (HYDRODIURIL) 25 MG tablet TAKE 2 TABLETS BY MOUTH ONCE DAILY.  Marland Kitchen levocetirizine (XYZAL) 5 MG tablet Take 5 mg by mouth every evening.  Marland Kitchen lisinopril (PRINIVIL,ZESTRIL) 40 MG tablet TAKE 1 TABLET BY MOUTH ONCE DAILY  . metFORMIN (GLUCOPHAGE) 1000 MG tablet TAKE 1 TABLET BY MOUTH TWICE DAILY WITH MEALS  . metoprolol succinate  (TOPROL-XL) 25 MG 24 hr tablet TAKE 1 TABLET BY MOUTH TWICE DAILY  . montelukast (SINGULAIR) 10 MG tablet Take 10 mg by mouth at  bedtime.  . Multiple Vitamin (MULTIVITAMIN) tablet Take 1 tablet by mouth daily.  . Omega-3 Fatty Acids (FISH OIL) 1200 MG CAPS Take by mouth 4 (four) times daily.  . pioglitazone (ACTOS) 30 MG tablet TAKE 1 TABLET BY MOUTH ONCE DAILY  . PROAIR HFA 108 (90 Base) MCG/ACT inhaler INHALE 2 PUFFS INTO THE LUNGS EVERY 6 HOURS IF NEEDED FOR WHEEZING ORSHORTNESS OF BREATH  . sildenafil (REVATIO) 20 MG tablet Take 20 mg by mouth as needed.  . tamsulosin (FLOMAX) 0.4 MG CAPS capsule Take 0.4 mg by mouth daily with breakfast.   . testosterone cypionate (DEPOTESTOTERONE CYPIONATE) 200 MG/ML injection Inject 200 mg into the muscle every 14 (fourteen) days.  . traZODone (DESYREL) 50 MG tablet TAKE 1 and 1/2 TABLETS BY MOUTH AT BEDTIME  . [DISCONTINUED] pantoprazole (PROTONIX) 40 MG tablet TAKE 1 TABLET BY MOUTH ONCE DAILY  . pantoprazole (PROTONIX) 20 MG tablet Take 1 tablet (20 mg total) by mouth daily.  Marland Kitchen tiZANidine (ZANAFLEX) 4 MG tablet Take 1 tablet (4 mg total) by mouth at bedtime as needed for muscle spasms.   Facility-Administered Encounter Medications as of 11/16/2017  Medication  . methylPREDNISolone acetate (DEPO-MEDROL) injection 40 mg    Review of Systems  Constitutional: Negative for appetite change and unexpected weight change.  HENT: Negative for congestion and sinus pressure.   Respiratory: Negative for cough, chest tightness and shortness of breath.   Cardiovascular: Negative for chest pain, palpitations and leg swelling.  Gastrointestinal: Positive for diarrhea. Negative for abdominal pain and vomiting.       Nausea only occurred when constipated.    Genitourinary: Negative for difficulty urinating and dysuria.  Musculoskeletal:       Knee pain and swelling as outlined.  Low back pain as outlined.    Skin: Negative for color change and rash.  Neurological:  Negative for dizziness, light-headedness and headaches.  Psychiatric/Behavioral: Negative for agitation and dysphoric mood.       Objective:     Blood pressure rechecked by me:  154/78  Physical Exam  Constitutional: He appears well-developed and well-nourished. No distress.  HENT:  Nose: Nose normal.  Mouth/Throat: Oropharynx is clear and moist.  Neck: Neck supple. No thyromegaly present.  Cardiovascular: Normal rate and regular rhythm.  Pulmonary/Chest: Effort normal and breath sounds normal. No respiratory distress.  Abdominal: Soft. Bowel sounds are normal. There is no tenderness.  Musculoskeletal: He exhibits no edema.  Increased pain to palpation - right knee - over patella.  Negative SLR.  Increased pain with weight bearing - pain in knee.    Lymphadenopathy:    He has no cervical adenopathy.  Skin: No rash noted. No erythema.  Psychiatric: He has a normal mood and affect. His behavior is normal.    BP (!) 167/74 (BP Location: Left Arm, Patient Position: Sitting, Cuff Size: Normal)   Pulse 61   Temp 98.1 F (36.7 C) (Oral)   Resp 18   Ht _0  (1.727 m)   Wt 222 lb 2 oz (100.8 kg)   SpO2 97%   BMI 33.77 kg/m  Wt Readings from Last 3 Encounters:  11/16/17 222 lb 2 oz (100.8 kg)  07/20/17 222 lb (100.7 kg)  06/11/17 225 lb 9.6 oz (102.3 kg)     Lab Results  Component Value Date   WBC 8.2 06/11/2017   HGB 15.5 06/11/2017   HCT 45.9 06/11/2017   PLT 258.0 06/11/2017   GLUCOSE 150 (H) 06/11/2017   CHOL 138 02/05/2017  TRIG 258.0 (H) 02/05/2017   HDL 31.80 (L) 02/05/2017   LDLDIRECT 77.0 02/05/2017   LDLCALC 58 11/21/2013   ALT 20 06/11/2017   AST 17 06/11/2017   NA 141 06/11/2017   K 4.1 06/11/2017   CL 103 06/11/2017   CREATININE 1.00 06/11/2017   BUN 18 06/11/2017   CO2 29 06/11/2017   TSH 0.45 06/05/2016   PSA 0.4 08/11/2014   HGBA1C 7.1 (H) 02/05/2017   MICROALBUR 6.4 (H) 12/08/2016       Assessment & Plan:   Problem List Items Addressed  This Visit    Anemia, iron deficiency    Follow cbc.        Back pain    Previously followed by Dr Sharlet Salina.  Recent spasms as outlined.  Does not feel the same as previous back flares.  No pain radiating down legs.  Feels knee aggravating.  zanaflex as directed. Tylenol.  Follow.       Relevant Medications   tiZANidine (ZANAFLEX) 4 MG tablet   BMI 33.0-33.9,adult    Diet and exercise.        Diabetes mellitus ([place])    Sugars reviewed.  Have been doing well.  Follow met b and a1c.  Low carb diet.        Relevant Orders   Hemoglobin O8C   Basic metabolic panel   Diarrhea    Persistent diarrhea/loose stool.  Unclear etiology.  Intermittent bloating.  Eating.  Had recent colonoscopy.  abx in 06/2017.  Check stool for c.diff, etc.  Decrease protonx.  Discussed referral to GI.  Consider CT.  Check labs first.        Relevant Orders   Gastrointestinal Pathogen Panel PCR   Ambulatory referral to Gastroenterology   Amylase   Lipase   GERD (gastroesophageal reflux disease)    Controlled.  Will decrease protonix to 46m q day.  May be able to switch to zantac.  Follow.        Relevant Medications   pantoprazole (PROTONIX) 20 MG tablet   Hypercholesterolemia    On lipitor.  Low cholesterol diet and exercise.  Follow lipid panel and liver function tests.        Relevant Orders   Lipid panel   Hepatic function panel   Hypertension    Blood pressure has been doing well.  Reviewed outside checks.  Blood pressures averaging 130s/60s.  Elevated today.  Feel related to pain, etc.  Hold on making changes in his medication.  Follow pressures.  Follow metabolic panel.        Relevant Orders   CBC with Differential/Platelet   TSH   Near syncope    Question if vasovagal episode.  Blood pressure 80/40.  Discussed staying hydrated.  EKG - SR with LBBB.  Per Dr FBethanne Gingernote, last ekg with LBBB.  Will have cardiology reevaluate to confirm no further cardiac w/up warranated.        Relevant  Orders   Ambulatory referral to Cardiology   Obstructive sleep apnea    CPAP.        Other Visit Diagnoses    Syncope, unspecified syncope type       Relevant Orders   EKG 12-Lead (Completed)      I spent 45 minutes with the patient and more than 50% of the time was spent in consultation regarding the above.  Time spent discussing current issues and ongoing symptoms.  Time also spent discussing further evaluation and treatment options.  Einar Pheasant, MD

## 2017-11-17 ENCOUNTER — Encounter: Payer: Self-pay | Admitting: Internal Medicine

## 2017-11-17 ENCOUNTER — Other Ambulatory Visit (INDEPENDENT_AMBULATORY_CARE_PROVIDER_SITE_OTHER): Payer: Medicare Other

## 2017-11-17 DIAGNOSIS — E119 Type 2 diabetes mellitus without complications: Secondary | ICD-10-CM

## 2017-11-17 DIAGNOSIS — I10 Essential (primary) hypertension: Secondary | ICD-10-CM | POA: Diagnosis not present

## 2017-11-17 DIAGNOSIS — E7800 Pure hypercholesterolemia, unspecified: Secondary | ICD-10-CM | POA: Diagnosis not present

## 2017-11-17 DIAGNOSIS — R197 Diarrhea, unspecified: Secondary | ICD-10-CM | POA: Insufficient documentation

## 2017-11-17 DIAGNOSIS — R55 Syncope and collapse: Secondary | ICD-10-CM | POA: Insufficient documentation

## 2017-11-17 LAB — CBC WITH DIFFERENTIAL/PLATELET
Basophils Absolute: 0 10*3/uL (ref 0.0–0.1)
Basophils Relative: 0.2 % (ref 0.0–3.0)
EOS PCT: 0.1 % (ref 0.0–5.0)
Eosinophils Absolute: 0 10*3/uL (ref 0.0–0.7)
HCT: 42.6 % (ref 39.0–52.0)
HEMOGLOBIN: 14.5 g/dL (ref 13.0–17.0)
LYMPHS PCT: 12.7 % (ref 12.0–46.0)
Lymphs Abs: 1.6 10*3/uL (ref 0.7–4.0)
MCHC: 33.9 g/dL (ref 30.0–36.0)
MCV: 88.8 fl (ref 78.0–100.0)
MONOS PCT: 4 % (ref 3.0–12.0)
Monocytes Absolute: 0.5 10*3/uL (ref 0.1–1.0)
Neutro Abs: 10.3 10*3/uL — ABNORMAL HIGH (ref 1.4–7.7)
Neutrophils Relative %: 83 % — ABNORMAL HIGH (ref 43.0–77.0)
Platelets: 275 10*3/uL (ref 150.0–400.0)
RBC: 4.8 Mil/uL (ref 4.22–5.81)
RDW: 13.6 % (ref 11.5–15.5)
WBC: 12.4 10*3/uL — AB (ref 4.0–10.5)

## 2017-11-17 LAB — BASIC METABOLIC PANEL
BUN: 19 mg/dL (ref 6–23)
CALCIUM: 9.8 mg/dL (ref 8.4–10.5)
CO2: 30 mEq/L (ref 19–32)
CREATININE: 0.84 mg/dL (ref 0.40–1.50)
Chloride: 98 mEq/L (ref 96–112)
GFR: 95.08 mL/min (ref >60.00)
Glucose, Bld: 158 mg/dL — ABNORMAL HIGH (ref 70–99)
Potassium: 4 mEq/L (ref 3.5–5.1)
Sodium: 140 mEq/L (ref 135–145)

## 2017-11-17 LAB — LIPID PANEL
CHOL/HDL RATIO: 4
Cholesterol: 159 mg/dL (ref 0–200)
HDL: 38.3 mg/dL — AB (ref >39.00)
NONHDL: 120.99
TRIGLYCERIDES: 228 mg/dL — AB (ref 0.0–149.0)
VLDL: 45.6 mg/dL — ABNORMAL HIGH (ref 0.0–40.0)

## 2017-11-17 LAB — HEPATIC FUNCTION PANEL
ALBUMIN: 4.1 g/dL (ref 3.5–5.2)
ALT: 24 U/L (ref 0–53)
AST: 15 U/L (ref 0–37)
Alkaline Phosphatase: 39 U/L (ref 39–117)
Bilirubin, Direct: 0.2 mg/dL (ref 0.0–0.3)
TOTAL PROTEIN: 6.5 g/dL (ref 6.0–8.3)
Total Bilirubin: 0.8 mg/dL (ref 0.2–1.2)

## 2017-11-17 LAB — AMYLASE: AMYLASE: 19 U/L — AB (ref 27–131)

## 2017-11-17 LAB — LIPASE: LIPASE: 17 U/L (ref 11.0–59.0)

## 2017-11-17 LAB — LDL CHOLESTEROL, DIRECT: LDL DIRECT: 99 mg/dL

## 2017-11-17 LAB — HEMOGLOBIN A1C: Hgb A1c MFr Bld: 7.1 % — ABNORMAL HIGH (ref 4.6–6.5)

## 2017-11-17 LAB — TSH: TSH: 0.51 u[IU]/mL (ref 0.35–4.50)

## 2017-11-17 NOTE — Assessment & Plan Note (Signed)
On lipitor.  Low cholesterol diet and exercise.  Follow lipid panel and liver function tests.   

## 2017-11-17 NOTE — Assessment & Plan Note (Signed)
Diet and exercise.   

## 2017-11-17 NOTE — Assessment & Plan Note (Signed)
Previously followed by Dr Sharlet Salina.  Recent spasms as outlined.  Does not feel the same as previous back flares.  No pain radiating down legs.  Feels knee aggravating.  zanaflex as directed. Tylenol.  Follow.

## 2017-11-17 NOTE — Assessment & Plan Note (Signed)
Persistent diarrhea/loose stool.  Unclear etiology.  Intermittent bloating.  Eating.  Had recent colonoscopy.  abx in 06/2017.  Check stool for c.diff, etc.  Decrease protonx.  Discussed referral to GI.  Consider CT.  Check labs first.

## 2017-11-17 NOTE — Assessment & Plan Note (Signed)
Blood pressure has been doing well.  Reviewed outside checks.  Blood pressures averaging 130s/60s.  Elevated today.  Feel related to pain, etc.  Hold on making changes in his medication.  Follow pressures.  Follow metabolic panel.

## 2017-11-17 NOTE — Assessment & Plan Note (Signed)
Controlled.  Will decrease protonix to 20mg  q day.  May be able to switch to zantac.  Follow.

## 2017-11-17 NOTE — Assessment & Plan Note (Signed)
CPAP.  

## 2017-11-17 NOTE — Assessment & Plan Note (Signed)
Question if vasovagal episode.  Blood pressure 80/40.  Discussed staying hydrated.  EKG - SR with LBBB.  Per Dr Bethanne Ginger note, last ekg with LBBB.  Will have cardiology reevaluate to confirm no further cardiac w/up warranated.

## 2017-11-17 NOTE — Assessment & Plan Note (Signed)
Sugars reviewed.  Have been doing well.  Follow met b and a1c.  Low carb diet.

## 2017-11-17 NOTE — Assessment & Plan Note (Signed)
Follow cbc.  

## 2017-11-19 ENCOUNTER — Other Ambulatory Visit: Payer: Self-pay | Admitting: Internal Medicine

## 2017-11-20 ENCOUNTER — Encounter (INDEPENDENT_AMBULATORY_CARE_PROVIDER_SITE_OTHER): Payer: Self-pay

## 2017-11-23 ENCOUNTER — Encounter: Payer: Self-pay | Admitting: Internal Medicine

## 2017-11-23 LAB — GASTROINTESTINAL PATHOGEN PANEL PCR
C. DIFFICILE TOX A/B, PCR: NOT DETECTED
CAMPYLOBACTER, PCR: NOT DETECTED
Cryptosporidium, PCR: NOT DETECTED
E COLI (ETEC) LT/ST, PCR: NOT DETECTED
E COLI 0157, PCR: NOT DETECTED
E coli (STEC) stx1/stx2, PCR: NOT DETECTED
GIARDIA LAMBLIA, PCR: NOT DETECTED
Norovirus, PCR: NOT DETECTED
Rotavirus A, PCR: NOT DETECTED
Salmonella, PCR: NOT DETECTED
Shigella, PCR: NOT DETECTED

## 2017-12-15 ENCOUNTER — Encounter: Payer: Self-pay | Admitting: Internal Medicine

## 2017-12-15 ENCOUNTER — Other Ambulatory Visit: Payer: Self-pay | Admitting: Internal Medicine

## 2017-12-15 ENCOUNTER — Ambulatory Visit (INDEPENDENT_AMBULATORY_CARE_PROVIDER_SITE_OTHER): Payer: Medicare Other | Admitting: Internal Medicine

## 2017-12-15 VITALS — BP 128/70 | HR 64 | Temp 97.8°F | Resp 18 | Ht 68.0 in | Wt 220.6 lb

## 2017-12-15 DIAGNOSIS — G4733 Obstructive sleep apnea (adult) (pediatric): Secondary | ICD-10-CM

## 2017-12-15 DIAGNOSIS — K219 Gastro-esophageal reflux disease without esophagitis: Secondary | ICD-10-CM

## 2017-12-15 DIAGNOSIS — R197 Diarrhea, unspecified: Secondary | ICD-10-CM

## 2017-12-15 DIAGNOSIS — M109 Gout, unspecified: Secondary | ICD-10-CM

## 2017-12-15 DIAGNOSIS — Z6833 Body mass index (BMI) 33.0-33.9, adult: Secondary | ICD-10-CM | POA: Diagnosis not present

## 2017-12-15 DIAGNOSIS — M5442 Lumbago with sciatica, left side: Secondary | ICD-10-CM | POA: Diagnosis not present

## 2017-12-15 DIAGNOSIS — I10 Essential (primary) hypertension: Secondary | ICD-10-CM

## 2017-12-15 DIAGNOSIS — Z0000 Encounter for general adult medical examination without abnormal findings: Secondary | ICD-10-CM

## 2017-12-15 DIAGNOSIS — E7800 Pure hypercholesterolemia, unspecified: Secondary | ICD-10-CM

## 2017-12-15 DIAGNOSIS — E119 Type 2 diabetes mellitus without complications: Secondary | ICD-10-CM

## 2017-12-15 DIAGNOSIS — D509 Iron deficiency anemia, unspecified: Secondary | ICD-10-CM

## 2017-12-15 LAB — HM DIABETES FOOT EXAM

## 2017-12-15 MED ORDER — PANTOPRAZOLE SODIUM 20 MG PO TBEC: 20.0000 mg | DELAYED_RELEASE_TABLET | Freq: Every day | ORAL | 2 refills | Status: DC

## 2017-12-15 MED ORDER — ALPRAZOLAM 0.25 MG PO TABS: 0.2500 mg | ORAL_TABLET | Freq: Every day | ORAL | 1 refills | Status: DC | PRN

## 2017-12-15 MED ORDER — COLCHICINE 0.6 MG PO TABS: 0.6000 mg | ORAL_TABLET | Freq: Two times a day (BID) | ORAL | 0 refills | Status: DC

## 2017-12-15 NOTE — Progress Notes (Addendum)
Patient ID: Ricardo Nash, male   DOB: [birth year], 72 y.o.   MRN: [id number]   Subjective:    Patient ID: Ricardo Nash, male    DOB: 02-Jan-1946, 72 y.o.   MRN: [id number]  HPI  Patient here for his physical exam.  Doing better.  Feels better.  Back and knees are better.  He is exercising.  Swimming.  No chest pain.  Breathing stale.  Not requiring muscle relaxer or pain medication.  Had previous syncopal episode.  Saw cardiology.  Felt was probably a vasovagal episode.  Planning for stress test later this week.  States his stomach and bowels are some better. Did decrease dose of protonix.  Doing well on the 31m dose.  Still some episodes of loose stools - intermittent.  Has appt with GI 12/22/17.  Blood pressures doing well.  Blood sugars reviewed.  Overall doing well.  AM sugars averaging 130-140s and pm sugars averaging low 100s.     Past Medical History:  Diagnosis Date  . C. difficile colitis   . Coronary artery disease   . Depression   . Diabetes mellitus ([place]   . Diverticulitis   . Diverticulosis   . Environmental allergies   . GERD (gastroesophageal reflux disease)   . Goiter    intrathoracic, s/p benign biopsy (Dr GHarlow Asa  . Hypercholesterolemia   . Hypertension   . Osteoarthritis    cervical spine, lumbar spine  . Paroxysmal atrial fibrillation ([facility])   . PONV (postoperative nausea and vomiting)   . Sleep apnea   . [place]roke ([place]   . TIA (transient ischemic attack)    Past Surgical History:  Procedure Laterality Date  . BACK SURGERY  8/08   s/p fusion of L4-L5  . capsule endoscopy    . CATARACT EXTRACTION  2011   Dr. [facility] . [facility]  . COLONOSCOPY WITH PROPOFOL N/A 07/20/2017   Procedure: COLONOSCOPY WITH PROPOFOL;  Surgeon: EManya Silvas MD;  [facility];  Service: Endoscopy;  Laterality: N/A;  . EYE SURGERY     cataract bilateral 10/1999  . eyelid reduction     Dr. CCarlis Abbott . INGUINAL HERNIA REPAIR  1991   Dr, STamala Julian   . NOSE SURGERY     turbinate reduction  . SEPTOPLASTY  1975  . SHOULDER SURGERY  2000   rotator cuff   Family History  Problem Relation Age of Onset  . Congestive Heart Failure Father   . Heart disease Father        myocardial infarction  . Rheumatic fever Father        valvular disease  . Heart disease Mother        s/p CABG (age 73  . Kidney disease Sister   . Colon cancer Neg Hx   . Prostate cancer Neg Hx    Social History   Socioeconomic History  . Marital status: Married    Spouse name: Not on file  . Number of children: 2  . Years of education: Not on file  . Highest education level: Not on file  Occupational History  . Occupation: retired tTour manager . Financial resource strain: Not on file  . Food insecurity:    Worry: Not on file    Inability: Not on file  . Transportation needs:    Medical: Not on file    Non-medical: Not on file  Tobacco Use  . Smoking status: Former SResearch scientist (life sciences) .  Smokeless tobacco: Never Used  Substance and Sexual Activity  . Alcohol use: Yes    Alcohol/week: 0.0 oz    Comment: occasional  . Drug use: No  . Sexual activity: Not Currently  Lifestyle  . Physical activity:    Days per week: Not on file    Minutes per session: Not on file  . Stress: Not on file  Relationships  . Social connections:    Talks on phone: Not on file    Gets together: Not on file    Attends religious service: Not on file    Active member of club or organization: Not on file    Attends meetings of clubs or organizations: Not on file    Relationship status: Not on file  Other Topics Concern  . Not on file  Social History Narrative  . Not on file    Outpatient Encounter Medications as of 12/15/2017  Medication Sig  . ACCU-CHEK AVIVA PLUS test strip USE TO TEST BLOOD SUGAR TWICE DAILY  . ALPRAZolam (XANAX) 0.25 MG tablet Take 1 tablet (0.25 mg total) by mouth daily as needed.  Marland Kitchen amLODipine (NORVASC) 10 MG tablet TAKE 1 TABLET BY MOUTH ONCE  DAILY  . aspirin 81 MG tablet Take 81 mg by mouth daily.  Marland Kitchen atorvastatin (LIPITOR) 10 MG tablet TAKE 1 TABLET BY MOUTH ONCE DAILY  . Azelastine-Fluticasone (DYMISTA NA) Place 2 puffs into the nose daily.  . clopidogrel (PLAVIX) 75 MG tablet TAKE 1 TABLET BY MOUTH ONCE DAILY  . colchicine 0.6 MG tablet Take 1 tablet (0.6 mg total) by mouth 2 (two) times daily.  . fluticasone (FLONASE) 50 MCG/ACT nasal spray Place into both nostrils daily.  Marland Kitchen levocetirizine (XYZAL) 5 MG tablet Take 5 mg by mouth every evening.  Marland Kitchen lisinopril (PRINIVIL,ZESTRIL) 40 MG tablet TAKE 1 TABLET BY MOUTH ONCE DAILY  . metFORMIN (GLUCOPHAGE) 1000 MG tablet TAKE 1 TABLET BY MOUTH TWICE DAILY WITH MEALS  . montelukast (SINGULAIR) 10 MG tablet Take 10 mg by mouth at bedtime.  . Multiple Vitamin (MULTIVITAMIN) tablet Take 1 tablet by mouth daily.  . Omega-3 Fatty Acids (FISH OIL) 1200 MG CAPS Take by mouth 4 (four) times daily.  . pantoprazole (PROTONIX) 20 MG tablet Take 1 tablet (20 mg total) by mouth daily.  Marland Kitchen PROAIR HFA 108 (90 Base) MCG/ACT inhaler INHALE 2 PUFFS INTO THE LUNGS EVERY 6 HOURS IF NEEDED FOR WHEEZING ORSHORTNESS OF BREATH  . sildenafil (REVATIO) 20 MG tablet Take 20 mg by mouth as needed.  . tamsulosin (FLOMAX) 0.4 MG CAPS capsule Take 0.4 mg by mouth daily with breakfast.   . testosterone cypionate (DEPOTESTOTERONE CYPIONATE) 200 MG/ML injection Inject 200 mg into the muscle every 14 (fourteen) days.  Marland Kitchen tiZANidine (ZANAFLEX) 4 MG tablet Take 1 tablet (4 mg total) by mouth at bedtime as needed for muscle spasms.  . traZODone (DESYREL) 50 MG tablet TAKE 1 and 1/2 TABLETS BY MOUTH AT BEDTIME  . [DISCONTINUED] ALPRAZolam (XANAX) 0.25 MG tablet Take 1 tablet (0.25 mg total) by mouth daily as needed.  . [DISCONTINUED] clopidogrel (PLAVIX) 75 MG tablet Take 1 tablet (75 mg total) by mouth daily.  . [DISCONTINUED] colchicine 0.6 MG tablet Take 1 tablet (0.6 mg total) by mouth 2 (two) times daily.  . [DISCONTINUED]  glipiZIDE (GLUCOTROL XL) 2.5 MG 24 hr tablet TAKE 3 TABLETS BY MOUTH ONCE EVERY MORNING  . [DISCONTINUED] hydrochlorothiazide (HYDRODIURIL) 25 MG tablet TAKE 2 TABLETS BY MOUTH ONCE DAILY.  . [DISCONTINUED] metoprolol  succinate (TOPROL-XL) 25 MG 24 hr tablet TAKE 1 TABLET BY MOUTH TWICE DAILY  . [DISCONTINUED] pantoprazole (PROTONIX) 20 MG tablet Take 1 tablet (20 mg total) by mouth daily.  . [DISCONTINUED] pioglitazone (ACTOS) 30 MG tablet TAKE 1 TABLET BY MOUTH ONCE DAILY   Facility-Administered Encounter Medications as of 12/15/2017  Medication  . methylPREDNISolone acetate (DEPO-MEDROL) injection 40 mg    Review of Systems  Constitutional: Negative for appetite change and unexpected weight change.  HENT: Negative for congestion and sinus pressure.   Eyes: Negative for pain and visual disturbance.  Respiratory: Negative for cough, chest tightness and shortness of breath.   Cardiovascular: Negative for chest pain, palpitations and leg swelling.  Gastrointestinal: Negative for abdominal pain, nausea and vomiting.       Still with persistent loose stools.    Genitourinary: Negative for difficulty urinating and dysuria.  Musculoskeletal: Negative for joint swelling and myalgias.       Knees and back better.    Skin: Negative for color change and rash.  Neurological: Negative for dizziness, light-headedness and headaches.  Hematological: Negative for adenopathy. Does not bruise/bleed easily.  Psychiatric/Behavioral: Negative for agitation and dysphoric mood.       Objective:    Physical Exam  Constitutional: He is oriented to person, place, and time. He appears well-developed and well-nourished. No distress.  HENT:  Head: Normocephalic and atraumatic.  Nose: Nose normal.  Mouth/Throat: Oropharynx is clear and moist. No oropharyngeal exudate.  Eyes: Conjunctivae are normal. Right eye exhibits no discharge. Left eye exhibits no discharge.  Neck: Neck supple. No thyromegaly present.    Cardiovascular: Normal rate and regular rhythm.  Pulmonary/Chest: Breath sounds normal. No respiratory distress. He has no wheezes.  Abdominal: Soft. Bowel sounds are normal. There is no tenderness.  Genitourinary:  Genitourinary Comments: Not performed.    Musculoskeletal: He exhibits no edema or tenderness.  Feet:  No lesions.  DP pulses palpable and equal bilaterally.  Some decreased sensation (pinprick) left foot - improves as advance up foot.    Lymphadenopathy:    He has no cervical adenopathy.  Neurological: He is alert and oriented to person, place, and time.  Skin: No rash noted. No erythema.  Psychiatric: He has a normal mood and affect. His behavior is normal.    BP 128/70 (BP Location: Left Arm, Patient Position: Sitting, Cuff Size: Large)   Pulse 64   Temp 97.8 F (36.6 C) (Oral)   Resp 18   Ht '5\' 8"'  (1.727 m)   Wt 220 lb 9.6 oz (100.1 kg)   SpO2 97%   BMI 33.54 kg/m  Wt Readings from Last 3 Encounters:  12/15/17 220 lb 9.6 oz (100.1 kg)  11/16/17 222 lb 2 oz (100.8 kg)  07/20/17 222 lb (100.7 kg)     Lab Results  Component Value Date   WBC 12.4 (H) 11/17/2017   HGB 14.5 11/17/2017   HCT 42.6 11/17/2017   PLT 275.0 11/17/2017   GLUCOSE 158 (H) 11/17/2017   CHOL 159 11/17/2017   TRIG 228.0 (H) 11/17/2017   HDL 38.30 (L) 11/17/2017   LDLDIRECT 99.0 11/17/2017   LDLCALC 58 11/21/2013   ALT 24 11/17/2017   AST 15 11/17/2017   NA 140 11/17/2017   K 4.0 11/17/2017   CL 98 11/17/2017   CREATININE 0.84 11/17/2017   BUN 19 11/17/2017   CO2 30 11/17/2017   TSH 0.51 11/17/2017   PSA 0.4 08/11/2014   HGBA1C 7.1 (H) 11/17/2017   MICROALBUR  6.4 (H) 12/08/2016       Assessment & Plan:   Problem List Items Addressed This Visit    Anemia, iron deficiency    Follow cbc.        Relevant Orders   CBC with Differential/Platelet   Back pain    Better.  No increased pain now.  Follow.        BMI 33.0-33.9,adult    Discussed diet and exercise.  Follow.         Diabetes mellitus ([place])    Sugars reviewed and as outlined.  Low carb diet and exercise.  Follow met b and a1c.        Relevant Orders   Hemoglobin H6O   Basic metabolic panel   Microalbumin / creatinine urine ratio   Diarrhea    Better.  On decreased dose of protonix now.  Controlling upper symptoms.  Taking probiotic.  Has appt with GI next week for further w/up - given persistence.        GERD (gastroesophageal reflux disease)    Controlled on lower dose of protinx.  Follow.        Relevant Medications   pantoprazole (PROTONIX) 20 MG tablet   Gout    Request rx for colchicine to have if needed on his trip.        Health care maintenance    Physical today 12/15/17.  Has been followed by Dr Jacqlyn Larsen for prostate checks and psa.  colonoscopy 07/20/17 - tubular adenomatous polyp.        Hypercholesterolemia    On lipitor.  Low cholesterol diet and exercise.  Follow lipid panel and liver function tests.        Relevant Orders   Hepatic function panel   Lipid panel   Hypertension    Blood pressure doing well.  Reviewed outside checks.  Follow.        Obstructive sleep apnea    CPAP.        Other Visit Diagnoses    Routine general medical examination at a health care facility    -  Primary      Addendum.  Pt is using CPAP and is benefiting from using this on a regular basis.    Einar Pheasant, MD

## 2017-12-15 NOTE — Assessment & Plan Note (Signed)
Physical today 12/15/17.  Has been followed by Dr Jacqlyn Larsen for prostate checks and psa.  colonoscopy 07/20/17 - tubular adenomatous polyp.

## 2017-12-16 ENCOUNTER — Other Ambulatory Visit: Payer: Self-pay | Admitting: Internal Medicine

## 2017-12-16 ENCOUNTER — Encounter: Payer: Self-pay | Admitting: Internal Medicine

## 2017-12-16 NOTE — Assessment & Plan Note (Signed)
CPAP.  

## 2017-12-16 NOTE — Assessment & Plan Note (Signed)
Better.  No increased pain now.  Follow.

## 2017-12-16 NOTE — Assessment & Plan Note (Signed)
On lipitor.  Low cholesterol diet and exercise.  Follow lipid panel and liver function tests.   

## 2017-12-16 NOTE — Assessment & Plan Note (Signed)
Sugars reviewed and as outlined.  Low carb diet and exercise.  Follow met b and a1c.

## 2017-12-16 NOTE — Assessment & Plan Note (Signed)
Better.  On decreased dose of protonix now.  Controlling upper symptoms.  Taking probiotic.  Has appt with GI next week for further w/up - given persistence.

## 2017-12-16 NOTE — Assessment & Plan Note (Signed)
Controlled on lower dose of protinx.  Follow.

## 2017-12-16 NOTE — Assessment & Plan Note (Signed)
Follow cbc.  

## 2017-12-16 NOTE — Assessment & Plan Note (Signed)
Discussed diet and exercise.  Follow.  

## 2017-12-16 NOTE — Assessment & Plan Note (Signed)
Request rx for colchicine to have if needed on his trip.

## 2017-12-16 NOTE — Assessment & Plan Note (Signed)
Blood pressure doing well.  Reviewed outside checks.  Follow.

## 2017-12-22 ENCOUNTER — Ambulatory Visit: Payer: Medicare Other | Admitting: Gastroenterology

## 2017-12-22 ENCOUNTER — Encounter: Payer: Self-pay | Admitting: Gastroenterology

## 2017-12-22 VITALS — BP 167/76 | HR 73 | Ht 68.0 in | Wt 218.8 lb

## 2017-12-22 DIAGNOSIS — K5900 Constipation, unspecified: Secondary | ICD-10-CM | POA: Diagnosis not present

## 2017-12-22 DIAGNOSIS — R1013 Epigastric pain: Secondary | ICD-10-CM

## 2017-12-22 DIAGNOSIS — D509 Iron deficiency anemia, unspecified: Secondary | ICD-10-CM

## 2017-12-22 NOTE — Patient Instructions (Signed)
F/U 3 months Miralax daily  High-Fiber Diet Fiber, also called dietary fiber, is a type of carbohydrate found in fruits, vegetables, whole grains, and beans. A high-fiber diet can have many health benefits. Your health care provider may recommend a high-fiber diet to help:  Prevent constipation. Fiber can make your bowel movements more regular.  Lower your cholesterol.  Relieve hemorrhoids, uncomplicated diverticulosis, or irritable bowel syndrome.  Prevent overeating as part of a weight-loss plan.  Prevent heart disease, type 2 diabetes, and certain cancers.  What is my plan? The recommended daily intake of fiber includes:  38 grams for men under age 72.  69 grams for men over age 17.  48 grams for women under age 2.  25 grams for women over age 70.  You can get the recommended daily intake of dietary fiber by eating a variety of fruits, vegetables, grains, and beans. Your health care provider may also recommend a fiber supplement if it is not possible to get enough fiber through your diet. What do I need to know about a high-fiber diet?  Fiber supplements have not been widely studied for their effectiveness, so it is better to get fiber through food sources.  Always check the fiber content on thenutrition facts label of any prepackaged food. Look for foods that contain at least 5 grams of fiber per serving.  Ask your dietitian if you have questions about specific foods that are related to your condition, especially if those foods are not listed in the following section.  Increase your daily fiber consumption gradually. Increasing your intake of dietary fiber too quickly may cause bloating, cramping, or gas.  Drink plenty of water. Water helps you to digest fiber. What foods can I eat? Grains Whole-grain breads. Multigrain cereal. Oats and oatmeal. Brown rice. Barley. Bulgur wheat. [place]. Bran muffins. Popcorn. Rye wafer crackers. Vegetables Sweet potatoes. Spinach.  Kale. Artichokes. Cabbage. Broccoli. Green peas. Carrots. Squash. Fruits Berries. Pears. Apples. Oranges. Avocados. Prunes and raisins. Dried figs. Meats and Other Protein Sources [place], kidney, pinto, and soy beans. Split peas. Lentils. Nuts and seeds. Dairy Fiber-fortified yogurt. Beverages Fiber-fortified soy milk. Fiber-fortified orange juice. Other Fiber bars. The items listed above may not be a complete list of recommended foods or beverages. Contact your dietitian for more options. What foods are not recommended? Grains White bread. Pasta made with refined flour. White rice. Vegetables Fried potatoes. Canned vegetables. Well-cooked vegetables. Fruits Fruit juice. Cooked, strained fruit. Meats and Other Protein Sources Fatty cuts of meat. Fried Sales executive or fried fish. Dairy Milk. Yogurt. Cream cheese. Sour cream. Beverages Soft drinks. Other Cakes and pastries. Butter and oils. The items listed above may not be a complete list of foods and beverages to avoid. Contact your dietitian for more information. What are some tips for including high-fiber foods in my diet?  Eat a wide variety of high-fiber foods.  Make sure that half of all grains consumed each day are whole grains.  Replace breads and cereals made from refined flour or white flour with whole-grain breads and cereals.  Replace white rice with brown rice, bulgur wheat, or millet.  Start the day with a breakfast that is high in fiber, such as a cereal that contains at least 5 grams of fiber per serving.  Use beans in place of meat in soups, salads, or pasta.  Eat high-fiber snacks, such as berries, raw vegetables, nuts, or popcorn. This information is not intended to replace advice given to you by your health care  provider. Make sure you discuss any questions you have with your health care provider. Document Released: 04/28/2005 Document Revised: 10/04/2015 Document Reviewed: 10/11/2013 Elsevier Interactive  Patient Education  [employer].

## 2017-12-23 ENCOUNTER — Other Ambulatory Visit
Admission: RE | Admit: 2017-12-23 | Discharge: 2017-12-23 | Disposition: A | Discharge status: Home / Self Care | Payer: Medicare Other | Source: Ambulatory Visit | Attending: Gastroenterology | Admitting: Gastroenterology

## 2017-12-23 DIAGNOSIS — D509 Iron deficiency anemia, unspecified: Secondary | ICD-10-CM | POA: Diagnosis present

## 2017-12-23 DIAGNOSIS — R1013 Epigastric pain: Secondary | ICD-10-CM | POA: Insufficient documentation

## 2017-12-23 LAB — IRON AND TIBC
Iron: 88 ug/dL (ref 45–182)
Saturation Ratios: 20 % (ref 17.9–39.5)
TIBC: 443 ug/dL (ref 250–450)
UIBC: 355 ug/dL

## 2017-12-23 LAB — FERRITIN: FERRITIN: 34 ng/mL (ref 24–336)

## 2017-12-23 NOTE — Progress Notes (Signed)
Ricardo Nash, [place] [address]  Main: [phone]  Fax: [phone]   Gastroenterology Consultation  Referring Provider:     Einar Pheasant, MD Primary Care Physician:  Einar Pheasant, MD Reason for Consultation:     Alternating constipation and diarrhea        HPI:    Chief Complaint  Patient presents with  . New Patient (Initial Visit)    cycles of constipation and diarrhea  . bloating, gas, abd pain    Ricardo Nash is a 72 y.o. y/o male referred for consultation & management  by Dr. Einar Pheasant, MD.  Patient reports going 3 to 4 days without a bowel movement, and in the states he feels bloated and has abdominal distention after eating, and abdominal discomfort.  He will then take stool softeners over-the-counter, and has a bowel movement, which helps with the bloating and abdominal distention.  No blood in stool.  No weight loss.  No nausea vomiting, heartburn or dysphagia.  He also reports, after 3 to 4 days of constipation he will have 1 day of loose stools all day, without blood.  He was previously seen by Dr. Vira Agar and his team, please see care everywhere further notes.  Patient states he was told by them that he is just going to have constipation, and just to take laxatives as he needs them, and thus he is changing providers.  Last colonoscopy was in March 2019 by Dr. Vira Agar 1 diminutive polyp seen in the cecum removed.  Diverticulosis reported.  Internal hemorrhoids reported.  Biopsies were also done for microscopic colitis in the sigmoid colon. Pathology showed tubular adenoma polyp.  Random sigmoid biopsies did not show any pathologic change.  Also has previous history of iron deficiency, and used to be on oral iron replacement.  Patient states he has not been on this for years.  Hemoglobin is normal at 14.5, with normal MCV at 88.8.  Last ferritin unable to find in his chart, was low at 7.7 in 2016.  Past  Medical History:  Diagnosis Date  . C. difficile colitis   . Coronary artery disease   . Depression   . Diabetes mellitus ([place])   . Diverticulitis   . Diverticulosis   . Environmental allergies   . GERD (gastroesophageal reflux disease)   . Goiter    intrathoracic, s/p benign biopsy (Dr Harlow Asa)  . Hypercholesterolemia   . Hypertension   . Osteoarthritis    cervical spine, lumbar spine  . Paroxysmal atrial fibrillation ([facility])   . PONV (postoperative nausea and vomiting)   . Sleep apnea   . Stroke ([place])   . TIA (transient ischemic attack)     Past Surgical History:  Procedure Laterality Date  . BACK SURGERY  8/08   s/p fusion of L4-L5  . capsule endoscopy    . CATARACT EXTRACTION  2011   Dr. Herbert Deaner  . [place] SURGERY  2002  . COLONOSCOPY WITH PROPOFOL N/A 07/20/2017   Procedure: COLONOSCOPY WITH PROPOFOL;  Surgeon: Manya Silvas, MD;  Location[facility] ENDOSCOPY;  Service: Endoscopy;  Laterality: N/A;  . EYE SURGERY     cataract bilateral 10/1999  . eyelid reduction     Dr. Carlis Abbott  . INGUINAL HERNIA REPAIR  1991   Dr, Tamala Julian  . NOSE SURGERY     turbinate reduction  . SEPTOPLASTY  1975  . SHOULDER SURGERY  2000   rotator cuff  Prior to Admission medications   Medication Sig Start Date End Date Taking? Authorizing Provider  ACCU-CHEK AVIVA PLUS test strip USE TO TEST BLOOD SUGAR TWICE DAILY 11/20/17  Yes Einar Pheasant, MD  ALPRAZolam Duanne Moron) 0.25 MG tablet Take 1 tablet (0.25 mg total) by mouth daily as needed. 12/15/17  Yes Einar Pheasant, MD  amLODipine (NORVASC) 10 MG tablet TAKE 1 TABLET BY MOUTH ONCE DAILY 06/10/17  Yes Einar Pheasant, MD  aspirin 81 MG tablet Take 81 mg by mouth daily.   Yes [provider]  atorvastatin (LIPITOR) 10 MG tablet TAKE 1 TABLET BY MOUTH ONCE DAILY 08/07/17  Yes Einar Pheasant, MD  Azelastine-Fluticasone (DYMISTA NA) Place 2 puffs into the nose daily.   Yes [provider]  clopidogrel (PLAVIX) 75 MG tablet  TAKE 1 TABLET BY MOUTH ONCE DAILY 11/20/17  Yes Einar Pheasant, MD  colchicine 0.6 MG tablet Take 1 tablet (0.6 mg total) by mouth 2 (two) times daily. 12/15/17  Yes Einar Pheasant, MD  glipiZIDE (GLUCOTROL XL) 2.5 MG 24 hr tablet TAKE 3 TABLETS BY MOUTH ONCE EVERY MORNING 12/16/17  Yes Einar Pheasant, MD  glucose blood test strip Accu-Chek Aviva Plus test strips   Yes [provider]  hydrochlorothiazide (HYDRODIURIL) 25 MG tablet TAKE 2 TABLETS BY MOUTH ONCE DAILY 12/16/17  Yes Einar Pheasant, MD  levocetirizine (XYZAL) 5 MG tablet Take 5 mg by mouth every evening.   Yes Mosetta Anis, MD  lisinopril (PRINIVIL,ZESTRIL) 40 MG tablet TAKE 1 TABLET BY MOUTH ONCE DAILY 07/23/17  Yes Einar Pheasant, MD  metFORMIN (GLUCOPHAGE) 1000 MG tablet TAKE 1 TABLET BY MOUTH TWICE DAILY WITH MEALS 08/17/17  Yes Einar Pheasant, MD  metoprolol succinate (TOPROL-XL) 25 MG 24 hr tablet TAKE 1 TABLET BY MOUTH TWICE DAILY 12/16/17  Yes Einar Pheasant, MD  montelukast (SINGULAIR) 10 MG tablet Take 10 mg by mouth at bedtime.   Yes [provider]  Multiple Vitamin (MULTIVITAMIN) tablet Take 1 tablet by mouth daily.   Yes [provider]  Omega-3 Fatty Acids (FISH OIL) 1200 MG CAPS Take by mouth 4 (four) times daily.   Yes [provider]  pantoprazole (PROTONIX) 20 MG tablet Take 1 tablet (20 mg total) by mouth daily. 12/15/17  Yes Einar Pheasant, MD  pioglitazone (ACTOS) 30 MG tablet TAKE 1 TABLET BY MOUTH ONCE DAILY 12/16/17  Yes Einar Pheasant, MD  tamsulosin (FLOMAX) 0.4 MG CAPS capsule Take 0.4 mg by mouth daily with breakfast.  03/24/14  Yes [provider]  testosterone cypionate (DEPOTESTOTERONE CYPIONATE) 200 MG/ML injection Inject 200 mg into the muscle every 14 (fourteen) days. 08/17/14  Yes [provider]  tiZANidine (ZANAFLEX) 4 MG tablet Take 1 tablet (4 mg total) by mouth at bedtime as needed for muscle spasms. 11/16/17  Yes Einar Pheasant, MD  traZODone  (DESYREL) 50 MG tablet TAKE 1 and 1/2 TABLETS BY MOUTH AT BEDTIME 11/03/17  Yes Einar Pheasant, MD    Family History  Problem Relation Age of Onset  . Congestive Heart Failure Father   . Heart disease Father        myocardial infarction  . Rheumatic fever Father        valvular disease  . Heart disease Mother        s/p CABG (age 44)  . Kidney disease Sister   . Colon cancer Neg Hx   . Prostate cancer Neg Hx      Social History   Tobacco Use  . Smoking status:  Former Smoker  . Smokeless tobacco: Never Used  Substance Use Topics  . Alcohol use: Yes    Alcohol/week: 0.0 standard drinks    Comment: occasional  . Drug use: No    Allergies as of 12/22/2017 - Review Complete 12/22/2017  Allergen Reaction Noted  . Penicillins  04/02/2012    Review of Systems:    All systems reviewed and negative except where noted in HPI.   Physical Exam:  BP (!) 167/76   Pulse 73   Ht 5\' 8"  (1.727 m)   Wt 218 lb 12.8 oz (99.2 kg)   BMI 33.27 kg/m  No LMP for male patient. Psych:  Alert and cooperative. Normal mood and affect. General:   Alert,  Well-developed, well-nourished, pleasant and cooperative in NAD Head:  Normocephalic and atraumatic. Eyes:  Sclera clear, no icterus.   Conjunctiva pink. Ears:  Normal auditory acuity. Nose:  No deformity, discharge, or lesions. Mouth:  No deformity or lesions,oropharynx pink & moist. Neck:  Supple; no masses or thyromegaly. Lungs:  Respirations even and unlabored.  Clear throughout to auscultation.   No wheezes, crackles, or rhonchi. No acute distress. Heart:  Regular rate and rhythm; no murmurs, clicks, rubs, or gallops. Abdomen:  Normal bowel sounds.  No bruits.  Soft, non-tender and non-distended without masses, hepatosplenomegaly or hernias noted.  No guarding or rebound tenderness.    Msk:  Symmetrical without gross deformities. Good, equal movement & strength bilaterally. Pulses:  Normal pulses noted. Extremities:  No clubbing or  edema.  No cyanosis. Neurologic:  Alert and oriented x3;  grossly normal neurologically. Skin:  Intact without significant lesions or rashes. No jaundice. Lymph Nodes:  No significant cervical adenopathy. Psych:  Alert and cooperative. Normal mood and affect.   Labs: CBC    Component Value Date/Time   WBC 12.4 (H) 11/17/2017 0858   RBC 4.80 11/17/2017 0858   HGB 14.5 11/17/2017 0858   HCT 42.6 11/17/2017 0858   PLT 275.0 11/17/2017 0858   MCV 88.8 11/17/2017 0858   MCHC 33.9 11/17/2017 0858   RDW 13.6 11/17/2017 0858   LYMPHSABS 1.6 11/17/2017 0858   MONOABS 0.5 11/17/2017 0858   EOSABS 0.0 11/17/2017 0858   BASOSABS 0.0 11/17/2017 0858   CMP     Component Value Date/Time   NA 140 11/17/2017 0858   K 4.0 11/17/2017 0858   CL 98 11/17/2017 0858   CO2 30 11/17/2017 0858   GLUCOSE 158 (H) 11/17/2017 0858   BUN 19 11/17/2017 0858   CREATININE 0.84 11/17/2017 0858   CALCIUM 9.8 11/17/2017 0858   PROT 6.5 11/17/2017 0858   ALBUMIN 4.1 11/17/2017 0858   AST 15 11/17/2017 0858   ALT 24 11/17/2017 0858   ALKPHOS 39 11/17/2017 0858   BILITOT 0.8 11/17/2017 0858    Imaging Studies: No results found.  Assessment and Plan:   Krew Hortman is a 72 y.o. y/o male has been referred for alternating diarrhea and constipation  Patient likely has constipation, and then after 3 to 4 days he is having postobstructive diarrhea His colonoscopy, and normal labs are reassuring High-fiber diet MiraLAX daily with goal of 1-2 soft bowel movements daily.  If not at goal, patient instructed to increase dose to twice daily.  If loose stools with the medication, patient asked to decrease the medication to every other day, or half dose daily.  Patient verbalized understanding  We will also recheck his ferritin and iron due to his previous history of iron deficiency  If ferritin is still low, can consider EGD We will also check H. pylori serology since patient is on PPI for heartburn, and  stool test can be false negative in the setting He is on once daily PPI, and used to be on twice daily.  Once daily dose is controlling his heartburn well He states this is managed by his primary care provider States without it he gets severe heartburn as he has tried being off it in the past.  Dr Ricardo Antigua

## 2017-12-24 LAB — H PYLORI, IGM, IGG, IGA AB
H. Pylogi, Iga Abs: <9 units (ref 0.0–8.9)
H. Pylogi, Igm Abs: <9 units (ref 0.0–8.9)

## 2017-12-29 ENCOUNTER — Telehealth: Payer: Self-pay | Admitting: Gastroenterology

## 2017-12-29 NOTE — Telephone Encounter (Signed)
Pt is calling for some questions on his Lab results that he saw on MyChart today

## 2017-12-31 NOTE — Telephone Encounter (Signed)
LMTCO.

## 2018-01-26 ENCOUNTER — Telehealth: Payer: Self-pay | Admitting: Internal Medicine

## 2018-01-27 NOTE — Telephone Encounter (Signed)
Pt calling because he is completely out.  pt now aware to call 2-3 days prior to being out of med. Pt states he has been a pt 20 years and never heard that before.

## 2018-01-29 NOTE — Telephone Encounter (Signed)
I called pt to ask if he had any questions regarding his lab work and he said no, that he had gotten the message. He is feeling better and has a f/u appt in 6 months in clinic.

## 2018-02-03 ENCOUNTER — Encounter: Payer: Self-pay | Admitting: Internal Medicine

## 2018-02-03 ENCOUNTER — Telehealth: Payer: Self-pay

## 2018-02-03 NOTE — Telephone Encounter (Signed)
Called to let pt know we just received and placed in quick sign for signature

## 2018-02-03 NOTE — Telephone Encounter (Signed)
Copied from [place] [id number]. Topic: General - Other >> Feb 03, 2018  2:26 PM Sheran Luz wrote: Reason for CRM: Pt called stating that the sleep center (pt cannot remember name) sent over a form about pt getting a new cpap. Pt would like to know if that has been received and when it can be sent back. Advised pt of paperwork turn around time and pt would like to know why everything seems to take so long (refills, paperwork, ect)

## 2018-02-04 NOTE — Telephone Encounter (Signed)
Signed and placed in box.   

## 2018-02-04 NOTE — Telephone Encounter (Signed)
My chart msg sent letting pt know that form would be sent today

## 2018-02-15 ENCOUNTER — Ambulatory Visit: Payer: Medicare Other

## 2018-02-24 ENCOUNTER — Telehealth: Payer: Self-pay | Admitting: Internal Medicine

## 2018-02-24 NOTE — Telephone Encounter (Signed)
I sent the office note from his last OV. Can we addend this note and resend?

## 2018-02-24 NOTE — Telephone Encounter (Unsigned)
Copied from [place] [id number]. Topic: Quick Communication - See Telephone Encounter >> Feb 24, 2018  1:43 PM Valla Leaver wrote: CRM for notification. See Telephone encounter for: 02/24/18. CB#[phone] Butch Penny with [facility] calling to request that the form faxed on 10/04 to authorize the set up of the patients cpap machine be filled out by Dr. Kavin Leech and MUST include that the patient has been using and benefiting from the cpap machine in order for Medicare to pay out on the claim. She says it will be easier if what was previously sent was addended to include this specific piece of information.

## 2018-02-24 NOTE — Telephone Encounter (Signed)
I added an addendum to the note.

## 2018-02-25 NOTE — Telephone Encounter (Signed)
Addended note faxed to Butch Penny at [facility]. She is aware

## 2018-03-22 ENCOUNTER — Ambulatory Visit: Payer: Medicare Other | Admitting: Gastroenterology

## 2018-03-22 ENCOUNTER — Encounter: Payer: Self-pay | Admitting: Gastroenterology

## 2018-03-22 VITALS — BP 169/74 | HR 61 | Ht 68.0 in | Wt 226.8 lb

## 2018-03-22 DIAGNOSIS — K5900 Constipation, unspecified: Secondary | ICD-10-CM | POA: Diagnosis not present

## 2018-03-22 NOTE — Progress Notes (Signed)
Vonda Antigua, MD [address]  [place]  [place], [place] [address]  Main: [phone]  Fax: [phone]   Primary Care Physician: Einar Pheasant, MD  Primary Gastroenterologist:  Dr. Vonda Antigua  Chief Complaint  Patient presents with  . Follow-up    constipation/diarrhea, better    HPI: Ricardo Nash is a 72 y.o. male here for follow-up of alternating constipation and diarrhea for which she was seen in August 2019, and symptoms were thought to be postobstructive diarrhea after having constipation for a few days, and has been treated with MiraLAX and symptoms have completely resolved.  Patient has titrated the MiraLAX to every other day and this dose works well for him.  He denies any further constipation or diarrhea.  Reports good appetite.  No nausea or vomiting.  No blood in stool.  No weight loss.  No alarm symptoms.  Previous history: H. pylori serology negative August 2019 Ferritin and iron labs normal August 2019   He was previously seen by Dr. Vira Agar and his team, please see care everywhere further notes.  Patient states he was told by them that he is just going to have constipation, and just to take laxatives as he needs them, and thus he is changing providers.  Last colonoscopy was in March 2019 by Dr. Vira Agar 1 diminutive polyp seen in the cecum removed.  Diverticulosis reported.  Internal hemorrhoids reported.  Biopsies were also done for microscopic colitis in the sigmoid colon. Pathology showed tubular adenoma polyp.  Random sigmoid biopsies did not show any pathologic change.    Current Outpatient Medications  Medication Sig Dispense Refill  . ACCU-CHEK AVIVA PLUS test strip USE TO TEST BLOOD SUGAR TWICE DAILY 100 each 2  . ALPRAZolam (XANAX) 0.25 MG tablet Take 1 tablet (0.25 mg total) by mouth daily as needed. 30 tablet 1  . amLODipine (NORVASC) 10 MG tablet TAKE 1 TABLET BY MOUTH ONCE DAILY 90 tablet 2  . aspirin 81 MG tablet  Take 81 mg by mouth daily.    Marland Kitchen atorvastatin (LIPITOR) 10 MG tablet TAKE 1 TABLET BY MOUTH ONCE DAILY 90 tablet 3  . Azelastine-Fluticasone (DYMISTA NA) Place 2 puffs into the nose daily.    . clopidogrel (PLAVIX) 75 MG tablet TAKE 1 TABLET BY MOUTH ONCE DAILY 90 tablet 0  . colchicine 0.6 MG tablet Take 1 tablet (0.6 mg total) by mouth 2 (two) times daily. 30 tablet 0  . glipiZIDE (GLUCOTROL XL) 2.5 MG 24 hr tablet TAKE 3 TABLETS BY MOUTH ONCE EVERY MORNING 270 tablet 0  . glucose blood test strip Accu-Chek Aviva Plus test strips    . hydrochlorothiazide (HYDRODIURIL) 25 MG tablet TAKE 2 TABLETS BY MOUTH ONCE DAILY 90 tablet 0  . levocetirizine (XYZAL) 5 MG tablet Take 5 mg by mouth every evening.    Marland Kitchen lisinopril (PRINIVIL,ZESTRIL) 40 MG tablet TAKE 1 TABLET BY MOUTH ONCE DAILY 90 tablet 3  . metFORMIN (GLUCOPHAGE) 1000 MG tablet TAKE 1 TABLET BY MOUTH TWICE DAILY WITH MEALS 180 tablet 11  . metoprolol succinate (TOPROL-XL) 25 MG 24 hr tablet TAKE 1 TABLET BY MOUTH TWICE DAILY 180 tablet 1  . montelukast (SINGULAIR) 10 MG tablet Take 10 mg by mouth at bedtime.    . Multiple Vitamin (MULTIVITAMIN) tablet Take 1 tablet by mouth daily.    . Omega-3 Fatty Acids (FISH OIL) 1200 MG CAPS Take by mouth 4 (four) times daily.    . pantoprazole (PROTONIX) 20 MG tablet Take 1  tablet (20 mg total) by mouth daily. 90 tablet 2  . pioglitazone (ACTOS) 30 MG tablet TAKE 1 TABLET BY MOUTH ONCE DAILY 90 tablet 0  . tamsulosin (FLOMAX) 0.4 MG CAPS capsule Take 0.4 mg by mouth daily with breakfast.     . testosterone cypionate (DEPOTESTOTERONE CYPIONATE) 200 MG/ML injection Inject 200 mg into the muscle every 14 (fourteen) days.    Marland Kitchen tiZANidine (ZANAFLEX) 4 MG tablet Take 1 tablet (4 mg total) by mouth at bedtime as needed for muscle spasms. 30 tablet 0  . traZODone (DESYREL) 50 MG tablet TAKE 1 and 1/2 TABLETS BY MOUTH AT BEDTIME 45 tablet 3   Current Facility-Administered Medications  Medication Dose Route  Frequency Provider Last Rate Last Dose  . methylPREDNISolone acetate (DEPO-MEDROL) injection 40 mg  40 mg Intramuscular Once Crecencio Mc, MD        Allergies as of 03/22/2018 - Review Complete 03/22/2018  Allergen Reaction Noted  . Penicillins  04/02/2012    ROS:  General: Negative for anorexia, weight loss, fever, chills, fatigue, weakness. ENT: Negative for hoarseness, difficulty swallowing , nasal congestion. CV: Negative for chest pain, angina, palpitations, dyspnea on exertion, peripheral edema.  Respiratory: Negative for dyspnea at rest, dyspnea on exertion, cough, sputum, wheezing.  GI: See history of present illness. GU:  Negative for dysuria, hematuria, urinary incontinence, urinary frequency, nocturnal urination.  Endo: Negative for unusual weight change.    Physical Examination:   BP (!) 169/74 Comment: pt states B/P goes up at doctor visit  Pulse 61   Ht 5\' 8"  (1.727 m)   Wt 226 lb 12.8 oz (102.9 kg)   BMI 34.48 kg/m   General: Well-nourished, well-developed in no acute distress.  Eyes: No icterus. Conjunctivae pink. Mouth: Oropharyngeal mucosa moist and pink , no lesions erythema or exudate. Neck: Supple, Trachea midline Abdomen: Bowel sounds are normal, nontender, nondistended, no hepatosplenomegaly or masses, no abdominal bruits or hernia , no rebound or guarding.   Extremities: No lower extremity edema. No clubbing or deformities. Neuro: Alert and oriented x 3.  Grossly intact. Skin: Warm and dry, no jaundice.   Psych: Alert and cooperative, normal mood and affect.   Labs: CMP     Component Value Date/Time   NA 140 11/17/2017 0858   K 4.0 11/17/2017 0858   CL 98 11/17/2017 0858   CO2 30 11/17/2017 0858   GLUCOSE 158 (H) 11/17/2017 0858   BUN 19 11/17/2017 0858   CREATININE 0.84 11/17/2017 0858   CALCIUM 9.8 11/17/2017 0858   PROT 6.5 11/17/2017 0858   ALBUMIN 4.1 11/17/2017 0858   AST 15 11/17/2017 0858   ALT 24 11/17/2017 0858   ALKPHOS 39  11/17/2017 0858   BILITOT 0.8 11/17/2017 0858   Lab Results  Component Value Date   WBC 12.4 (H) 11/17/2017   HGB 14.5 11/17/2017   HCT 42.6 11/17/2017   MCV 88.8 11/17/2017   PLT 275.0 11/17/2017    Imaging Studies: No results found.  Assessment and Plan:   Ricardo Nash is a 72 y.o. y/o male with history of constipation and diverticulosis here for follow-up  Constipation resolved Patient is having regular bowel movements with MiraLAX every other day No alarm symptoms  Colonoscopy up-to-date High-fiber diet MiraLAX with goal of 1-2 soft bowel movements daily.   Repeat colonoscopy due 5 years from 2019, recall set.  Follow-up as needed   Dr Vonda Antigua

## 2018-03-24 ENCOUNTER — Other Ambulatory Visit: Payer: Self-pay | Admitting: Internal Medicine

## 2018-03-26 LAB — HM DIABETES EYE EXAM

## 2018-03-31 ENCOUNTER — Encounter: Payer: Self-pay | Admitting: Internal Medicine

## 2018-03-31 ENCOUNTER — Other Ambulatory Visit (INDEPENDENT_AMBULATORY_CARE_PROVIDER_SITE_OTHER): Payer: Medicare Other

## 2018-03-31 DIAGNOSIS — E7800 Pure hypercholesterolemia, unspecified: Secondary | ICD-10-CM | POA: Diagnosis not present

## 2018-03-31 DIAGNOSIS — R197 Diarrhea, unspecified: Secondary | ICD-10-CM

## 2018-03-31 DIAGNOSIS — E119 Type 2 diabetes mellitus without complications: Secondary | ICD-10-CM

## 2018-03-31 DIAGNOSIS — D509 Iron deficiency anemia, unspecified: Secondary | ICD-10-CM

## 2018-03-31 DIAGNOSIS — R112 Nausea with vomiting, unspecified: Secondary | ICD-10-CM

## 2018-03-31 LAB — BASIC METABOLIC PANEL
BUN: 19 mg/dL (ref 6–23)
CHLORIDE: 100 meq/L (ref 96–112)
CO2: 26 meq/L (ref 19–32)
Calcium: 9.5 mg/dL (ref 8.4–10.5)
Creatinine, Ser: 0.94 mg/dL (ref 0.40–1.50)
GFR: 83.42 mL/min (ref >60.00)
Glucose, Bld: 172 mg/dL — ABNORMAL HIGH (ref 70–99)
Potassium: 3.9 mEq/L (ref 3.5–5.1)
Sodium: 136 mEq/L (ref 135–145)

## 2018-03-31 LAB — CBC WITH DIFFERENTIAL/PLATELET
Basophils Absolute: 0.1 10*3/uL (ref 0.0–0.1)
Basophils Relative: 0.7 % (ref 0.0–3.0)
Eosinophils Absolute: 0.1 10*3/uL (ref 0.0–0.7)
Eosinophils Relative: 0.9 % (ref 0.0–5.0)
HCT: 44.2 % (ref 39.0–52.0)
Hemoglobin: 14.9 g/dL (ref 13.0–17.0)
LYMPHS ABS: 1.8 10*3/uL (ref 0.7–4.0)
Lymphocytes Relative: 23.6 % (ref 12.0–46.0)
MCHC: 33.6 g/dL (ref 30.0–36.0)
MCV: 88.1 fl (ref 78.0–100.0)
MONO ABS: 0.4 10*3/uL (ref 0.1–1.0)
Monocytes Relative: 5.3 % (ref 3.0–12.0)
NEUTROS PCT: 69.5 % (ref 43.0–77.0)
Neutro Abs: 5.2 10*3/uL (ref 1.4–7.7)
Platelets: 247 10*3/uL (ref 150.0–400.0)
RBC: 5.02 Mil/uL (ref 4.22–5.81)
RDW: 14.7 % (ref 11.5–15.5)
WBC: 7.5 10*3/uL (ref 4.0–10.5)

## 2018-03-31 LAB — HEPATIC FUNCTION PANEL
ALBUMIN: 4.2 g/dL (ref 3.5–5.2)
ALK PHOS: 45 U/L (ref 39–117)
ALT: 22 U/L (ref 0–53)
AST: 18 U/L (ref 0–37)
Bilirubin, Direct: 0.1 mg/dL (ref 0.0–0.3)
Total Bilirubin: 0.8 mg/dL (ref 0.2–1.2)
Total Protein: 7 g/dL (ref 6.0–8.3)

## 2018-03-31 LAB — LDL CHOLESTEROL, DIRECT: Direct LDL: 91 mg/dL

## 2018-03-31 LAB — MICROALBUMIN / CREATININE URINE RATIO
Creatinine,U: 83.3 mg/dL
MICROALB UR: 3.8 mg/dL — AB (ref 0.0–1.9)
MICROALB/CREAT RATIO: 4.6 mg/g (ref 0.0–30.0)

## 2018-03-31 LAB — LIPID PANEL
CHOLESTEROL: 140 mg/dL (ref 0–200)
HDL: 30.6 mg/dL — AB (ref >39.00)
NonHDL: 109.66
Total CHOL/HDL Ratio: 5
Triglycerides: 229 mg/dL — ABNORMAL HIGH (ref 0.0–149.0)
VLDL: 45.8 mg/dL — AB (ref 0.0–40.0)

## 2018-03-31 LAB — HEMOGLOBIN A1C: HEMOGLOBIN A1C: 7.1 % — AB (ref 4.6–6.5)

## 2018-03-31 NOTE — Addendum Note (Signed)
Addended by: Arby Barrette on: 03/31/2018 08:35 AM   Modules accepted: Orders

## 2018-04-02 ENCOUNTER — Encounter: Payer: Self-pay | Admitting: Internal Medicine

## 2018-04-02 ENCOUNTER — Ambulatory Visit: Payer: Medicare Other | Admitting: Internal Medicine

## 2018-04-02 DIAGNOSIS — M5442 Lumbago with sciatica, left side: Secondary | ICD-10-CM | POA: Diagnosis not present

## 2018-04-02 DIAGNOSIS — K219 Gastro-esophageal reflux disease without esophagitis: Secondary | ICD-10-CM

## 2018-04-02 DIAGNOSIS — Z6833 Body mass index (BMI) 33.0-33.9, adult: Secondary | ICD-10-CM | POA: Diagnosis not present

## 2018-04-02 DIAGNOSIS — I10 Essential (primary) hypertension: Secondary | ICD-10-CM

## 2018-04-02 DIAGNOSIS — D509 Iron deficiency anemia, unspecified: Secondary | ICD-10-CM

## 2018-04-02 DIAGNOSIS — E7800 Pure hypercholesterolemia, unspecified: Secondary | ICD-10-CM

## 2018-04-02 DIAGNOSIS — R0989 Other specified symptoms and signs involving the circulatory and respiratory systems: Secondary | ICD-10-CM

## 2018-04-02 DIAGNOSIS — G4733 Obstructive sleep apnea (adult) (pediatric): Secondary | ICD-10-CM

## 2018-04-02 DIAGNOSIS — E119 Type 2 diabetes mellitus without complications: Secondary | ICD-10-CM

## 2018-04-02 DIAGNOSIS — M1990 Unspecified osteoarthritis, unspecified site: Secondary | ICD-10-CM

## 2018-04-02 MED ORDER — PIOGLITAZONE HCL 30 MG PO TABS: 30.0000 mg | ORAL_TABLET | Freq: Every day | ORAL | 0 refills | Status: DC

## 2018-04-02 MED ORDER — CLOPIDOGREL BISULFATE 75 MG PO TABS: 75.0000 mg | ORAL_TABLET | Freq: Every day | ORAL | 0 refills | Status: DC

## 2018-04-02 MED ORDER — TRAZODONE HCL 50 MG PO TABS: ORAL_TABLET | ORAL | 3 refills | Status: DC

## 2018-04-02 MED ORDER — GLIPIZIDE ER 2.5 MG PO TB24: ORAL_TABLET | ORAL | 0 refills | Status: DC

## 2018-04-02 MED ORDER — AMLODIPINE BESYLATE 10 MG PO TABS: 10.0000 mg | ORAL_TABLET | Freq: Every day | ORAL | 2 refills | Status: DC

## 2018-04-02 MED ORDER — HYDROCHLOROTHIAZIDE 25 MG PO TABS: 25.0000 mg | ORAL_TABLET | Freq: Every day | ORAL | 0 refills | Status: DC

## 2018-04-02 NOTE — Progress Notes (Signed)
Patient ID: Ricardo Nash, male   DOB: 04-04-1946, 72 y.o.   MRN: [id number]   Subjective:    Patient ID: Ricardo Nash, male    DOB: 1946-03-20, 71 y.o.   MRN: [id number]  HPI  Patient here for a scheduled follow up.  States he is taking miralax qod.  Bowels better.  No problems with constipation now.  No chest pain.  Breathing stable.  No acid reflux.  No abdominal pain.  Bowels moving.  No urine change.  Had eyes checked last week - Dr Ellin Mayhew.  States everything checked out fine.  Some arthritis in his knees.  Desires no further intervention.  Follow.  Sugars stable.  Reviewed outside checks.  Blood pressure stable.     Past Medical History:  Diagnosis Date  . C. difficile colitis   . Coronary artery disease   . Depression   . Diabetes mellitus ([place])   . Diverticulitis   . Diverticulosis   . Environmental allergies   . GERD (gastroesophageal reflux disease)   . Goiter    intrathoracic, s/p benign biopsy (Dr Harlow Asa)  . Hypercholesterolemia   . Hypertension   . Osteoarthritis    cervical spine, lumbar spine  . Paroxysmal atrial fibrillation ([facility])   . PONV (postoperative nausea and vomiting)   . Sleep apnea   . Stroke ([place])   . TIA (transient ischemic attack)    Past Surgical History:  Procedure Laterality Date  . BACK SURGERY  8/08   s/p fusion of L4-L5  . capsule endoscopy    . CATARACT EXTRACTION  2011   Dr. Herbert Deaner  . [place] SURGERY  2002  . COLONOSCOPY WITH PROPOFOL N/A 07/20/2017   Procedure: COLONOSCOPY WITH PROPOFOL;  Surgeon: Manya Silvas, MD;  Location: [facility] ENDOSCOPY;  Service: Endoscopy;  Laterality: N/A;  . EYE SURGERY     cataract bilateral 10/1999  . eyelid reduction     Dr. Carlis Abbott  . INGUINAL HERNIA REPAIR  1991   Dr, Tamala Julian  . NOSE SURGERY     turbinate reduction  . SEPTOPLASTY  1975  . SHOULDER SURGERY  2000   rotator cuff   Family History  Problem Relation Age of Onset  . Congestive Heart Failure Father   . Heart disease  Father        myocardial infarction  . Rheumatic fever Father        valvular disease  . Heart disease Mother        s/p CABG (age 35)  . Kidney disease Sister   . Colon cancer Neg Hx   . Prostate cancer Neg Hx    Social History   Socioeconomic History  . Marital status: Married    Spouse name: Not on file  . Number of children: 2  . Years of education: Not on file  . Highest education level: Not on file  Occupational History  . Occupation: retired Tour manager  . Financial resource strain: Not on file  . Food insecurity:    Worry: Not on file    Inability: Not on file  . Transportation needs:    Medical: Not on file    Non-medical: Not on file  Tobacco Use  . Smoking status: Former Research scientist (life sciences)  . Smokeless tobacco: Never Used  Substance and Sexual Activity  . Alcohol use: Yes    Alcohol/week: 0.0 standard drinks    Comment: occasional  . Drug use: No  . Sexual activity: Not Currently  Lifestyle  .  Physical activity:    Days per week: Not on file    Minutes per session: Not on file  . Stress: Not on file  Relationships  . Social connections:    Talks on phone: Not on file    Gets together: Not on file    Attends religious service: Not on file    Active member of club or organization: Not on file    Attends meetings of clubs or organizations: Not on file    Relationship status: Not on file  Other Topics Concern  . Not on file  Social History Narrative  . Not on file    Outpatient Encounter Medications as of 04/02/2018  Medication Sig  . ACCU-CHEK AVIVA PLUS test strip USE TO TEST BLOOD SUGAR TWICE DAILY  . ALPRAZolam (XANAX) 0.25 MG tablet Take 1 tablet (0.25 mg total) by mouth daily as needed.  Marland Kitchen amLODipine (NORVASC) 10 MG tablet Take 1 tablet (10 mg total) by mouth daily.  Marland Kitchen aspirin 81 MG tablet Take 81 mg by mouth daily.  Marland Kitchen atorvastatin (LIPITOR) 10 MG tablet TAKE 1 TABLET BY MOUTH ONCE DAILY  . Azelastine-Fluticasone (DYMISTA NA) Place 2 puffs  into the nose daily.  . clopidogrel (PLAVIX) 75 MG tablet Take 1 tablet (75 mg total) by mouth daily.  . colchicine 0.6 MG tablet Take 1 tablet (0.6 mg total) by mouth 2 (two) times daily.  Marland Kitchen glipiZIDE (GLUCOTROL XL) 2.5 MG 24 hr tablet TAKE 3 TABLETS BY MOUTH ONCE EVERY MORNING  . glucose blood test strip Accu-Chek Aviva Plus test strips  . hydrochlorothiazide (HYDRODIURIL) 25 MG tablet Take 1 tablet (25 mg total) by mouth daily.  Marland Kitchen levocetirizine (XYZAL) 5 MG tablet Take 5 mg by mouth every evening.  Marland Kitchen lisinopril (PRINIVIL,ZESTRIL) 40 MG tablet TAKE 1 TABLET BY MOUTH ONCE DAILY  . metFORMIN (GLUCOPHAGE) 1000 MG tablet TAKE 1 TABLET BY MOUTH TWICE DAILY WITH MEALS  . metoprolol succinate (TOPROL-XL) 25 MG 24 hr tablet TAKE 1 TABLET BY MOUTH TWICE DAILY  . montelukast (SINGULAIR) 10 MG tablet Take 10 mg by mouth at bedtime.  . Multiple Vitamin (MULTIVITAMIN) tablet Take 1 tablet by mouth daily.  . Omega-3 Fatty Acids (FISH OIL) 1200 MG CAPS Take by mouth 4 (four) times daily.  . pantoprazole (PROTONIX) 20 MG tablet Take 1 tablet (20 mg total) by mouth daily.  . pioglitazone (ACTOS) 30 MG tablet Take 1 tablet (30 mg total) by mouth daily.  . tamsulosin (FLOMAX) 0.4 MG CAPS capsule Take 0.4 mg by mouth daily with breakfast.   . testosterone cypionate (DEPOTESTOTERONE CYPIONATE) 200 MG/ML injection Inject 200 mg into the muscle every 14 (fourteen) days.  Marland Kitchen tiZANidine (ZANAFLEX) 4 MG tablet Take 1 tablet (4 mg total) by mouth at bedtime as needed for muscle spasms.  . traZODone (DESYREL) 50 MG tablet TAKE 1 and 1/2 TABLETS BY MOUTH AT BEDTIME  . [DISCONTINUED] amLODipine (NORVASC) 10 MG tablet TAKE 1 TABLET BY MOUTH ONCE DAILY  . [DISCONTINUED] clopidogrel (PLAVIX) 75 MG tablet TAKE 1 TABLET BY MOUTH ONCE DAILY  . [DISCONTINUED] glipiZIDE (GLUCOTROL XL) 2.5 MG 24 hr tablet TAKE 3 TABLETS BY MOUTH ONCE EVERY MORNING  . [DISCONTINUED] glipiZIDE (GLUCOTROL XL) 2.5 MG 24 hr tablet TAKE 3 TABLETS BY  MOUTH ONCE EVERY MORNING  . [DISCONTINUED] hydrochlorothiazide (HYDRODIURIL) 25 MG tablet TAKE 2 TABLETS BY MOUTH ONCE DAILY  . [DISCONTINUED] pioglitazone (ACTOS) 30 MG tablet TAKE 1 TABLET BY MOUTH ONCE DAILY  . [DISCONTINUED] pioglitazone (ACTOS)  30 MG tablet TAKE 1 TABLET BY MOUTH ONCE DAILY  . [DISCONTINUED] traZODone (DESYREL) 50 MG tablet TAKE 1 and 1/2 TABLETS BY MOUTH AT BEDTIME   Facility-Administered Encounter Medications as of 04/02/2018  Medication  . methylPREDNISolone acetate (DEPO-MEDROL) injection 40 mg    Review of Systems  Constitutional: Negative for appetite change and unexpected weight change.  HENT: Negative for congestion and sinus pressure.   Respiratory: Negative for cough, chest tightness and shortness of breath.   Cardiovascular: Negative for chest pain, palpitations and leg swelling.  Gastrointestinal: Negative for abdominal pain, diarrhea, nausea and vomiting.  Genitourinary: Negative for difficulty urinating and dysuria.  Musculoskeletal: Negative for joint swelling and myalgias.       Knee pain as outlined.    Skin: Negative for color change and rash.  Neurological: Negative for dizziness, light-headedness and headaches.  Psychiatric/Behavioral: Negative for agitation and dysphoric mood.       Objective:    Physical Exam  Constitutional: He appears well-developed and well-nourished. No distress.  HENT:  Nose: Nose normal.  Mouth/Throat: Oropharynx is clear and moist.  Neck: Neck supple.  Cardiovascular: Normal rate and regular rhythm.  Pulmonary/Chest: Effort normal and breath sounds normal. No respiratory distress.  Abdominal: Soft. Bowel sounds are normal. There is no tenderness.  Musculoskeletal: He exhibits no edema or tenderness.  Lymphadenopathy:    He has no cervical adenopathy.  Skin: No rash noted. No erythema.  Psychiatric: He has a normal mood and affect. His behavior is normal.    BP 138/66 (BP Location: Left Arm, Patient  Position: Sitting, Cuff Size: Normal)   Pulse 70   Temp 97.9 F (36.6 C) (Oral)   Resp 18   Wt 222 lb (100.7 kg)   SpO2 98%   BMI 33.75 kg/m  Wt Readings from Last 3 Encounters:  04/02/18 222 lb (100.7 kg)  03/22/18 226 lb 12.8 oz (102.9 kg)  12/22/17 218 lb 12.8 oz (99.2 kg)     Lab Results  Component Value Date   WBC 7.5 03/31/2018   HGB 14.9 03/31/2018   HCT 44.2 03/31/2018   PLT 247.0 03/31/2018   GLUCOSE 172 (H) 03/31/2018   CHOL 140 03/31/2018   TRIG 229.0 (H) 03/31/2018   HDL 30.60 (L) 03/31/2018   LDLDIRECT 91.0 03/31/2018   LDLCALC 58 11/21/2013   ALT 22 03/31/2018   AST 18 03/31/2018   NA 136 03/31/2018   K 3.9 03/31/2018   CL 100 03/31/2018   CREATININE 0.94 03/31/2018   BUN 19 03/31/2018   CO2 26 03/31/2018   TSH 0.51 11/17/2017   PSA 0.4 08/11/2014   HGBA1C 7.1 (H) 03/31/2018   MICROALBUR 3.8 (H) 03/31/2018       Assessment & Plan:   Problem List Items Addressed This Visit    Anemia, iron deficiency    Follow cbc.       Back pain    Better.  Stable.        BMI 33.0-33.9,adult    Discussed diet and exercise.  Follow.        Diabetes mellitus ([place])    Sugars reviewed.  Stable.  Low carb diet and exercise.  Follow met b and a1c.        Relevant Medications   glipiZIDE (GLUCOTROL XL) 2.5 MG 24 hr tablet   pioglitazone (ACTOS) 30 MG tablet   Other Relevant Orders   Hemoglobin G4W   Basic metabolic panel   Microalbumin / creatinine urine ratio   GERD (  gastroesophageal reflux disease)    Controlled on current regimen.  Follow.        Hypercholesterolemia    On lipitor.  Low cholesterol diet and exercise.  Follow lipid panel and liver function tests.        Relevant Medications   hydrochlorothiazide (HYDRODIURIL) 25 MG tablet   amLODipine (NORVASC) 10 MG tablet   Other Relevant Orders   Hepatic function panel   Lipid panel   Hypertension    Blood pressure as outlined.  Continue same medication regimen.  Follow pressures.  Follow  metabolic panel.        Relevant Medications   hydrochlorothiazide (HYDRODIURIL) 25 MG tablet   amLODipine (NORVASC) 10 MG tablet   Left carotid bruit    Schedule carotid ultrasound.        Relevant Orders   VAS US CAROTID   Obstructive sleep apnea    CPAP.       Osteoarthritis    Has seen Dr Jefm Bryant.  Some arthritis knees.  Stays active.  Is exercising.  Will notify me if feels needs any further intervention.  Follow.            Einar Pheasant, MD

## 2018-04-11 ENCOUNTER — Encounter: Payer: Self-pay | Admitting: Internal Medicine

## 2018-04-11 DIAGNOSIS — R0989 Other specified symptoms and signs involving the circulatory and respiratory systems: Secondary | ICD-10-CM | POA: Insufficient documentation

## 2018-04-11 NOTE — Assessment & Plan Note (Signed)
Has seen Dr Jefm Bryant.  Some arthritis knees.  Stays active.  Is exercising.  Will notify me if feels needs any further intervention.  Follow.

## 2018-04-11 NOTE — Assessment & Plan Note (Signed)
Blood pressure as outlined.  Continue same medication regimen.  Follow pressures.  Follow metabolic panel.  

## 2018-04-11 NOTE — Assessment & Plan Note (Signed)
Discussed diet and exercise.  Follow.  

## 2018-04-11 NOTE — Assessment & Plan Note (Signed)
On lipitor.  Low cholesterol diet and exercise.  Follow lipid panel and liver function tests.   

## 2018-04-11 NOTE — Assessment & Plan Note (Signed)
Controlled on current regimen.  Follow.  

## 2018-04-11 NOTE — Assessment & Plan Note (Signed)
CPAP.  

## 2018-04-11 NOTE — Assessment & Plan Note (Signed)
Follow cbc.  

## 2018-04-11 NOTE — Assessment & Plan Note (Signed)
Better.  Stable.

## 2018-04-11 NOTE — Assessment & Plan Note (Signed)
Schedule carotid ultrasound.  

## 2018-04-11 NOTE — Assessment & Plan Note (Signed)
Sugars reviewed.  Stable.  Low carb diet and exercise.  Follow met b and a1c.

## 2018-04-30 ENCOUNTER — Ambulatory Visit (INDEPENDENT_AMBULATORY_CARE_PROVIDER_SITE_OTHER): Payer: Medicare Other

## 2018-04-30 DIAGNOSIS — R0989 Other specified symptoms and signs involving the circulatory and respiratory systems: Secondary | ICD-10-CM

## 2018-05-03 ENCOUNTER — Encounter: Payer: Self-pay | Admitting: Internal Medicine

## 2018-05-07 ENCOUNTER — Other Ambulatory Visit: Payer: Self-pay | Admitting: Internal Medicine

## 2018-05-29 ENCOUNTER — Other Ambulatory Visit: Payer: Self-pay | Admitting: Internal Medicine

## 2018-06-09 ENCOUNTER — Other Ambulatory Visit: Payer: Self-pay | Admitting: Internal Medicine

## 2018-06-09 ENCOUNTER — Ambulatory Visit: Payer: Medicare Other

## 2018-06-09 DIAGNOSIS — I10 Essential (primary) hypertension: Secondary | ICD-10-CM

## 2018-06-16 ENCOUNTER — Ambulatory Visit (INDEPENDENT_AMBULATORY_CARE_PROVIDER_SITE_OTHER): Payer: Medicare Other

## 2018-06-16 VITALS — BP 142/64 | HR 64 | Temp 97.9°F | Resp 16 | Ht 68.0 in | Wt 226.8 lb

## 2018-06-16 DIAGNOSIS — Z0000 Encounter for general adult medical examination without abnormal findings: Secondary | ICD-10-CM | POA: Diagnosis not present

## 2018-06-16 NOTE — Patient Instructions (Addendum)
  Ricardo Nash , Thank you for taking time to come for your Medicare Wellness Visit. I appreciate your ongoing commitment to your health goals. Please review the following plan we discussed and let me know if I can assist you in the future.   Follow up as needed.    Have a great day!  These are the goals we discussed: Goals      Patient Stated   . Weight (lb) < 220 lb (99.8 kg) (pt-stated)       This is a list of the screening recommended for you and due dates:  Health Maintenance  Topic Date Due  . Hemoglobin A1C  09/29/2018  . Complete foot exam   12/16/2018  . Eye exam for diabetics  03/27/2019  . Tetanus Vaccine  05/12/2021  . Colon Cancer Screening  07/21/2027  . Flu Shot  Completed  .  Hepatitis C: One time screening is recommended by Center for Disease Control  (CDC) for  adults born from [birth year] through 1966.   Completed  . Pneumonia vaccines  Completed

## 2018-06-16 NOTE — Progress Notes (Signed)
Subjective:   Ricardo Nash is a 73 y.o. male who presents for Medicare Annual/Subsequent preventive examination.  Review of Systems:  No ROS.  Medicare Wellness Visit. Additional risk factors are reflected in the social history. Cardiac Risk Factors include: advanced age (>76men, >34 women);hypertension;diabetes mellitus;male gender     Objective:    Vitals: BP (!) 142/64 (BP Location: Left Arm, Patient Position: Sitting, Cuff Size: Normal)   Pulse 64   Temp 97.9 F (36.6 C) (Oral)   Resp 16   Ht 5\' 8"  (1.727 m)   Wt 226 lb 12.8 oz (102.9 kg)   SpO2 95%   BMI 34.48 kg/m   Body mass index is 34.48 kg/m.  Advanced Directives 06/16/2018 07/20/2017 06/08/2017 09/08/2016 09/06/2016 06/05/2016  Does Patient Have a Medical Advance Directive? Yes Yes Yes No No Yes  Type of Paramedic of [place];Living will [place];Living will [place];Living will - - [place];Living will  Does patient want to make changes to medical advance directive? No - Patient declined - No - Patient declined - - No - Patient declined  Copy of [place] in Chart? Yes - validated most recent copy scanned in chart (See row information) No - copy requested Yes - - No - copy requested  Would patient like information on creating a medical advance directive? - - - No - Patient declined No - Patient declined -    Tobacco Social History   Tobacco Use  Smoking Status Former Smoker  Smokeless Tobacco Never Used     Counseling given: Not Answered   Clinical Intake:  Pre-visit preparation completed: Yes        Diabetes: Yes(Folowed by pcp)  How often do you need to have someone help you when you read instructions, pamphlets, or other written materials from your doctor or pharmacy?: 1 - Never  Interpreter Needed?: No     Past Medical History:  Diagnosis Date  . C. difficile colitis   . Coronary artery  disease   . Depression   . Diabetes mellitus ([place])   . Diverticulitis   . Diverticulosis   . Environmental allergies   . GERD (gastroesophageal reflux disease)   . Goiter    intrathoracic, s/p benign biopsy (Dr Harlow Asa)  . Hypercholesterolemia   . Hypertension   . Osteoarthritis    cervical spine, lumbar spine  . Paroxysmal atrial fibrillation ([facility])   . PONV (postoperative nausea and vomiting)   . Sleep apnea   . Stroke ([place])   . TIA (transient ischemic attack)    Past Surgical History:  Procedure Laterality Date  . BACK SURGERY  8/08   s/p fusion of L4-L5  . capsule endoscopy    . CATARACT EXTRACTION  2011   Dr. Herbert Deaner  . [place] SURGERY  2002  . COLONOSCOPY WITH PROPOFOL N/A 07/20/2017   Procedure: COLONOSCOPY WITH PROPOFOL;  Surgeon: Manya Silvas, MD;  [facility];  Service: Endoscopy;  Laterality: N/A;  . EYE SURGERY     cataract bilateral 10/1999  . eyelid reduction     Dr. Carlis Abbott  . INGUINAL HERNIA REPAIR  1991   Dr, Tamala Julian  . NOSE SURGERY     turbinate reduction  . SEPTOPLASTY  1975  . SHOULDER SURGERY  2000   rotator cuff   Family History  Problem Relation Age of Onset  . Congestive Heart Failure Father   . Heart disease Father  myocardial infarction  . Rheumatic fever Father        valvular disease  . Heart disease Mother        s/p CABG (age 31)  . Kidney disease Sister   . Colon cancer Neg Hx   . Prostate cancer Neg Hx    Social History   Socioeconomic History  . Marital status: Married    Spouse name: Not on file  . Number of children: 2  . Years of education: Not on file  . Highest education level: Not on file  Occupational History  . Occupation: retired Tour manager  . Financial resource strain: Not hard at all  . Food insecurity:    Worry: Never true    Inability: Never true  . Transportation needs:    Medical: No    Non-medical: No  Tobacco Use  . Smoking status: Former Research scientist (life sciences)  . Smokeless  tobacco: Never Used  Substance and Sexual Activity  . Alcohol use: Yes    Alcohol/week: 0.0 standard drinks    Comment: occasional  . Drug use: No  . Sexual activity: Not Currently  Lifestyle  . Physical activity:    Days per week: 5 days    Minutes per session: 60 min  . Stress: Not at all  Relationships  . Social connections:    Talks on phone: Not on file    Gets together: Not on file    Attends religious service: Not on file    Active member of club or organization: Not on file    Attends meetings of clubs or organizations: Not on file    Relationship status: Not on file  Other Topics Concern  . Not on file  Social History Narrative  . Not on file    Outpatient Encounter Medications as of 06/16/2018  Medication Sig  . ACCU-CHEK AVIVA PLUS test strip USE TO TEST BLOOD SUGAR TWICE DAILY  . ALPRAZolam (XANAX) 0.25 MG tablet Take 1 tablet (0.25 mg total) by mouth daily as needed.  Marland Kitchen amLODipine (NORVASC) 10 MG tablet Take 1 tablet (10 mg total) by mouth daily.  Marland Kitchen aspirin 81 MG tablet Take 81 mg by mouth daily.  Marland Kitchen atorvastatin (LIPITOR) 10 MG tablet TAKE 1 TABLET BY MOUTH ONCE DAILY.  Marland Kitchen Azelastine-Fluticasone (DYMISTA NA) Place 2 puffs into the nose daily.  . clopidogrel (PLAVIX) 75 MG tablet TAKE 1 TABLET BY MOUTH ONCE DAILY  . colchicine 0.6 MG tablet Take 1 tablet (0.6 mg total) by mouth 2 (two) times daily.  Marland Kitchen glipiZIDE (GLUCOTROL XL) 2.5 MG 24 hr tablet TAKE 3 TABLETS BY MOUTH ONCE EVERY MORNING  . glucose blood test strip Accu-Chek Aviva Plus test strips  . hydrochlorothiazide (HYDRODIURIL) 25 MG tablet TAKE 2 TABLETS BY MOUTH ONCE DAILY  . levocetirizine (XYZAL) 5 MG tablet Take 5 mg by mouth every evening.  Marland Kitchen lisinopril (PRINIVIL,ZESTRIL) 40 MG tablet TAKE 1 TABLET BY MOUTH ONCE DAILY  . metFORMIN (GLUCOPHAGE) 1000 MG tablet TAKE 1 TABLET BY MOUTH TWICE DAILY WITH MEALS  . metoprolol succinate (TOPROL-XL) 25 MG 24 hr tablet TAKE 1 TABLET BY MOUTH TWICE DAILY  .  montelukast (SINGULAIR) 10 MG tablet Take 10 mg by mouth at bedtime.  . Multiple Vitamin (MULTIVITAMIN) tablet Take 1 tablet by mouth daily.  . Omega-3 Fatty Acids (FISH OIL) 1200 MG CAPS Take by mouth 4 (four) times daily.  . pantoprazole (PROTONIX) 20 MG tablet Take 1 tablet (20 mg total) by mouth daily.  Marland Kitchen  pioglitazone (ACTOS) 30 MG tablet Take 1 tablet (30 mg total) by mouth daily.  . tamsulosin (FLOMAX) 0.4 MG CAPS capsule Take 0.4 mg by mouth daily with breakfast.   . testosterone cypionate (DEPOTESTOTERONE CYPIONATE) 200 MG/ML injection Inject 200 mg into the muscle every 14 (fourteen) days.  . traZODone (DESYREL) 50 MG tablet TAKE 1 and 1/2 TABLETS BY MOUTH AT BEDTIME  . [DISCONTINUED] tiZANidine (ZANAFLEX) 4 MG tablet Take 1 tablet (4 mg total) by mouth at bedtime as needed for muscle spasms.   Facility-Administered Encounter Medications as of 06/16/2018  Medication  . methylPREDNISolone acetate (DEPO-MEDROL) injection 40 mg    Activities of Daily Living In your present state of health, do you have any difficulty performing the following activities: 06/16/2018  Hearing? Y  Comment Hearing aid  Vision? N  Difficulty concentrating or making decisions? N  Walking or climbing stairs? N  Dressing or bathing? N  Doing errands, shopping? N  Preparing Food and eating ? N  Using the Toilet? N  In the past six months, have you accidently leaked urine? N  Do you have problems with loss of bowel control? N  Managing your Medications? N  Managing your Finances? N  Housekeeping or managing your Housekeeping? N  Some recent data might be hidden    Patient Care Team: Einar Pheasant, MD as PCP - General (Internal Medicine)   Assessment:   This is a routine wellness examination for [place].  Monitors blood sugar at home BID; fasting averaging 125-140.  Health Screenings  Colonoscopy -07/20/17 Glaucoma -none Hearing- hearing aids Hemoglobin A1C -03/31/18 (7.1) Cholesterol 03/31/18  (140) Dental every 6 months Vision- every 12 months  Social  Alcohol intake -yes, 3 drinks per week Smoking history- former Smokers in home? none Illicit drug use? none Exercise -walking, yoga, calisthenics 5 days weekly, 60 minutes Diet -portion controlled Sexually Active -not currently  Safety  Patient feels safe at home.  Patient does have smoke detectors at home  Patient does wear sunscreen or protective clothing when in direct sunlight  Patient does wear seat belt when driving or riding with others.   Activities of Daily Living Patient can do their own household chores. Denies needing assistance with: driving, feeding themselves, getting from bed to chair, getting to the toilet, bathing/showering, dressing, managing money, climbing flight of stairs, or preparing meals.   Depression Screen Patient denies losing interest in daily life, feeling hopeless, or crying easily over simple problems.   Fall Screen Patient denies being afraid of falling or falling in the last year.   Memory Screen Patient denies problems with memory, misplacing items, and is able to balance checkbook/bank accounts.  Patient is alert, normal appearance, oriented to person/place/and time. Correctly identified the president of the [place], recall of 3/3 objects, and performing simple calculations.  Patient displays appropriate judgement and can read correct time from watch face.   Immunizations The following Immunizations are up to date: Influenza, shingles, pneumonia, and tetanus.   Other Providers Patient Care Team: Einar Pheasant, MD as PCP - General (Internal Medicine)  Exercise Activities and Dietary recommendations Current Exercise Habits: Home exercise routine, Type of exercise: stretching;calisthenics, Time (Minutes): 60, Frequency (Times/Week): 5, Weekly Exercise (Minutes/Week): 300, Intensity: Mild  Goals      Patient Stated   . Weight (lb) < 220 lb (99.8 kg) (pt-stated)       Fall  Risk Fall Risk  06/16/2018 06/08/2017 06/05/2016 09/07/2015 09/06/2014  Falls in the past year? 0 No Yes  No No  Number falls in past yr: - - 2 or more - -  Injury with Fall? - - No - -  Follow up - - Falls prevention discussed;Education provided - -   Depression Screen PHQ 2/9 Scores 06/16/2018 06/08/2017 06/05/2016 09/07/2015  PHQ - 2 Score 0 1 0 2  PHQ- 9 Score - - - 8    Cognitive Function MMSE - Mini Mental State Exam 06/05/2016  Orientation to time 5  Orientation to Place 5  Registration 3  Attention/ Calculation 5  Recall 3  Language- name 2 objects 2  Language- repeat 1  Language- follow 3 step command 3  Language- read & follow direction 1  Write a sentence 1  Copy design 1  Total score 30     6CIT Screen 06/16/2018 06/08/2017  What Year? 0 points 0 points  What month? 0 points 0 points  What time? 0 points 0 points  Count back from 20 0 points 0 points  Months in reverse 0 points 0 points  Repeat phrase 0 points 0 points  Total Score 0 0    Immunization History  Administered Date(s) Administered  . Influenza Split 02/16/2012, 02/13/2014  . Influenza, High Dose Seasonal PF 02/20/2016, 02/05/2017, 02/09/2018  . Influenza,inj,Quad PF,6+ Mos 01/14/2013, 03/08/2015  . Pneumococcal Conjugate-13 03/10/2013  . Pneumococcal Polysaccharide-23 08/23/2014  . Tdap 05/13/2011  . Zoster 12/08/2013  . Zoster Recombinat (Shingrix) 04/24/2017, 07/15/2017   Screening Tests Health Maintenance  Topic Date Due  . HEMOGLOBIN A1C  09/29/2018  . FOOT EXAM  12/16/2018  . OPHTHALMOLOGY EXAM  03/27/2019  . TETANUS/TDAP  05/12/2021  . COLONOSCOPY  07/21/2027  . INFLUENZA VACCINE  Completed  . Hepatitis C Screening  Completed  . PNA vac Low Risk Adult  Completed      Plan:    End of life planning; Advance aging; Advanced directives discussed. Copy of current HCPOA/Living Will on file.    I have personally reviewed and noted the following in the patient's chart:   . Medical and social  history . Use of alcohol, tobacco or illicit drugs  . Current medications and supplements . Functional ability and status . Nutritional status . Physical activity . Advanced directives . List of other physicians . Hospitalizations, surgeries, and ER visits in previous 12 months . Vitals . Screenings to include cognitive, depression, and falls . Referrals and appointments  In addition, I have reviewed and discussed with patient certain preventive protocols, quality metrics, and best practice recommendations. A written personalized care plan for preventive services as well as general preventive health recommendations were provided to patient.     Varney Biles, LPN  01/12/8100   Reviewed above information.  Agree with assessment and plan.    Dr Nicki Reaper

## 2018-07-02 ENCOUNTER — Telehealth: Payer: Self-pay

## 2018-07-02 NOTE — Telephone Encounter (Signed)
Copied from [place] [id number]. Topic: General - Other >> Jul 02, 2018 12:49 PM Yvette Rack wrote: Reason for CRM: John with [facility] called stating that a fax regarding the pt was sent on 06/23/18, 06/28/18, and 07/01/18 and he would like to know if the fax was received. John requests call back. Cb# [phone]

## 2018-07-02 NOTE — Telephone Encounter (Signed)
Holding form for pts appt, has not been seen since 11/22 and will need office note from next appt to send.

## 2018-07-03 ENCOUNTER — Other Ambulatory Visit: Payer: Self-pay | Admitting: Internal Medicine

## 2018-08-06 ENCOUNTER — Other Ambulatory Visit: Payer: Self-pay

## 2018-08-06 ENCOUNTER — Encounter: Payer: Self-pay | Admitting: *Deleted

## 2018-08-06 ENCOUNTER — Other Ambulatory Visit (INDEPENDENT_AMBULATORY_CARE_PROVIDER_SITE_OTHER): Payer: Medicare Other

## 2018-08-06 DIAGNOSIS — E7800 Pure hypercholesterolemia, unspecified: Secondary | ICD-10-CM

## 2018-08-06 DIAGNOSIS — E119 Type 2 diabetes mellitus without complications: Secondary | ICD-10-CM | POA: Diagnosis not present

## 2018-08-06 LAB — BASIC METABOLIC PANEL
BUN: 18 mg/dL (ref 6–23)
CALCIUM: 8.8 mg/dL (ref 8.4–10.5)
CHLORIDE: 102 meq/L (ref 96–112)
CO2: 25 meq/L (ref 19–32)
Creatinine, Ser: 0.9 mg/dL (ref 0.40–1.50)
GFR: 82.45 mL/min (ref >60.00)
Glucose, Bld: 174 mg/dL — ABNORMAL HIGH (ref 70–99)
Potassium: 3.9 mEq/L (ref 3.5–5.1)
SODIUM: 139 meq/L (ref 135–145)

## 2018-08-06 LAB — LIPID PANEL
CHOL/HDL RATIO: 4
CHOLESTEROL: 141 mg/dL (ref 0–200)
HDL: 32.3 mg/dL — ABNORMAL LOW (ref >39.00)
NONHDL: 108.31
TRIGLYCERIDES: 352 mg/dL — AB (ref 0.0–149.0)
VLDL: 70.4 mg/dL — ABNORMAL HIGH (ref 0.0–40.0)

## 2018-08-06 LAB — HEPATIC FUNCTION PANEL
ALBUMIN: 3.8 g/dL (ref 3.5–5.2)
ALK PHOS: 49 U/L (ref 39–117)
ALT: 16 U/L (ref 0–53)
AST: 17 U/L (ref 0–37)
BILIRUBIN DIRECT: 0.1 mg/dL (ref 0.0–0.3)
TOTAL PROTEIN: 6.2 g/dL (ref 6.0–8.3)
Total Bilirubin: 0.8 mg/dL (ref 0.2–1.2)

## 2018-08-06 LAB — HEMOGLOBIN A1C: Hgb A1c MFr Bld: 7.6 % — ABNORMAL HIGH (ref 4.6–6.5)

## 2018-08-06 LAB — LDL CHOLESTEROL, DIRECT: Direct LDL: 79 mg/dL

## 2018-08-09 ENCOUNTER — Other Ambulatory Visit: Payer: Self-pay

## 2018-08-09 ENCOUNTER — Encounter: Payer: Self-pay | Admitting: Internal Medicine

## 2018-08-09 ENCOUNTER — Ambulatory Visit: Payer: Medicare Other | Admitting: Internal Medicine

## 2018-08-09 DIAGNOSIS — J329 Chronic sinusitis, unspecified: Secondary | ICD-10-CM | POA: Diagnosis not present

## 2018-08-09 DIAGNOSIS — I10 Essential (primary) hypertension: Secondary | ICD-10-CM

## 2018-08-09 DIAGNOSIS — E119 Type 2 diabetes mellitus without complications: Secondary | ICD-10-CM

## 2018-08-09 DIAGNOSIS — K219 Gastro-esophageal reflux disease without esophagitis: Secondary | ICD-10-CM

## 2018-08-09 DIAGNOSIS — D509 Iron deficiency anemia, unspecified: Secondary | ICD-10-CM

## 2018-08-09 DIAGNOSIS — E7800 Pure hypercholesterolemia, unspecified: Secondary | ICD-10-CM

## 2018-08-09 DIAGNOSIS — R0989 Other specified symptoms and signs involving the circulatory and respiratory systems: Secondary | ICD-10-CM

## 2018-08-09 DIAGNOSIS — M79642 Pain in left hand: Secondary | ICD-10-CM | POA: Insufficient documentation

## 2018-08-09 DIAGNOSIS — G4733 Obstructive sleep apnea (adult) (pediatric): Secondary | ICD-10-CM

## 2018-08-09 LAB — MICROALBUMIN / CREATININE URINE RATIO
Creatinine,U: 104.9 mg/dL
Microalb Creat Ratio: 1.9 mg/g (ref 0.0–30.0)
Microalb, Ur: 2 mg/dL — ABNORMAL HIGH (ref 0.0–1.9)

## 2018-08-09 MED ORDER — ALPRAZOLAM 0.25 MG PO TABS: 0.2500 mg | ORAL_TABLET | Freq: Every day | ORAL | 1 refills | Status: DC | PRN

## 2018-08-09 MED ORDER — PANTOPRAZOLE SODIUM 20 MG PO TBEC: 20.0000 mg | DELAYED_RELEASE_TABLET | Freq: Every day | ORAL | 2 refills | Status: DC

## 2018-08-09 MED ORDER — PIOGLITAZONE HCL 30 MG PO TABS: 30.0000 mg | ORAL_TABLET | Freq: Every day | ORAL | 0 refills | Status: DC

## 2018-08-09 MED ORDER — METOPROLOL SUCCINATE ER 25 MG PO TB24: 25.0000 mg | ORAL_TABLET | Freq: Two times a day (BID) | ORAL | 1 refills | Status: DC

## 2018-08-09 MED ORDER — LISINOPRIL 40 MG PO TABS: 40.0000 mg | ORAL_TABLET | Freq: Every day | ORAL | 3 refills | Status: DC

## 2018-08-09 MED ORDER — GLIPIZIDE ER 2.5 MG PO TB24: ORAL_TABLET | ORAL | 0 refills | Status: DC

## 2018-08-09 MED ORDER — CLOPIDOGREL BISULFATE 75 MG PO TABS: 75.0000 mg | ORAL_TABLET | Freq: Every day | ORAL | 0 refills | Status: DC

## 2018-08-09 MED ORDER — SERTRALINE HCL 50 MG PO TABS: 50.0000 mg | ORAL_TABLET | Freq: Every day | ORAL | 2 refills | Status: DC

## 2018-08-09 MED ORDER — HYDROCHLOROTHIAZIDE 25 MG PO TABS: 50.0000 mg | ORAL_TABLET | Freq: Every day | ORAL | 0 refills | Status: DC

## 2018-08-09 NOTE — Assessment & Plan Note (Signed)
Using CPAP regularly.  Uses at least 8 hours a night.  Sleep improved.  Follow.

## 2018-08-09 NOTE — Assessment & Plan Note (Signed)
Had carotids evaluated.  States ok.  Follow.

## 2018-08-09 NOTE — Assessment & Plan Note (Signed)
Blood pressure under good control.  Continue same medication regimen.  Follow pressures.  Follow metabolic panel.   

## 2018-08-09 NOTE — Assessment & Plan Note (Signed)
On lipitor.  Low cholesterol diet and exercise.  Follow lipid panel and liver function tests.   

## 2018-08-09 NOTE — Assessment & Plan Note (Signed)
Controlled on current regimen.  Follow.  

## 2018-08-09 NOTE — Assessment & Plan Note (Signed)
Follow cbc.  

## 2018-08-09 NOTE — Assessment & Plan Note (Signed)
Recently saw ENT.  Treated.  Doing well now.  Follow.

## 2018-08-09 NOTE — Assessment & Plan Note (Signed)
Reviewed outside sugar readings.  Appear to be stable.  Low carb diet.  Has not been able to go to the gym.  Discussed diet and exercise.  Follow met b and a1c.

## 2018-08-09 NOTE — Progress Notes (Signed)
Patient ID: Ricardo Nash, male   DOB: 08-May-1946, 73 y.o.   MRN: [id number]   Subjective:    Patient ID: Ricardo Nash, male    DOB: Jan 08, 1946, 73 y.o.   MRN: [id number]  HPI  Patient here for a scheduled follow up.  He reports he is doing relatively well.  Recently saw ENT.  Diagnosed with sinus infection.  Treated with prednisone and abx.  Better.  No sinus pressure and significant congestion.  No chest pain.  No sob.  No acid reflux.  No abdominal pain.  Bowels moving.  States bowels do ok if takes miralax daily.  Reports that 6 weeks ago, he hit his hand - hard on granite counter.  States since that time, his hand has been swollen and remains tender.  Also reports his left fourth finger drifts down when he is trying to hold out his hand, etc.  Able to grip, but has pain with gripping.  Pain localized to base of third and fourth finger.  Answered his questions about corona virus.     Past Medical History:  Diagnosis Date  . C. difficile colitis   . Coronary artery disease   . Depression   . Diabetes mellitus ([place])   . Diverticulitis   . Diverticulosis   . Environmental allergies   . GERD (gastroesophageal reflux disease)   . Goiter    intrathoracic, s/p benign biopsy (Dr Harlow Asa)  . Hypercholesterolemia   . Hypertension   . Osteoarthritis    cervical spine, lumbar spine  . Paroxysmal atrial fibrillation ([facility])   . PONV (postoperative nausea and vomiting)   . Sleep apnea   . Stroke ([place])   . TIA (transient ischemic attack)    Past Surgical History:  Procedure Laterality Date  . BACK SURGERY  8/08   s/p fusion of L4-L5  . capsule endoscopy    . CATARACT EXTRACTION  2011   Dr. Herbert Deaner  . [place] SURGERY  2002  . COLONOSCOPY WITH PROPOFOL N/A 07/20/2017   Procedure: COLONOSCOPY WITH PROPOFOL;  Surgeon: Manya Silvas, MD;  Location: [facility] ENDOSCOPY;  Service: Endoscopy;  Laterality: N/A;  . EYE SURGERY     cataract bilateral 10/1999  . eyelid reduction     Dr.  Carlis Abbott  . INGUINAL HERNIA REPAIR  1991   Dr, Tamala Julian  . NOSE SURGERY     turbinate reduction  . SEPTOPLASTY  1975  . SHOULDER SURGERY  2000   rotator cuff   Family History  Problem Relation Age of Onset  . Congestive Heart Failure Father   . Heart disease Father        myocardial infarction  . Rheumatic fever Father        valvular disease  . Heart disease Mother        s/p CABG (age 45)  . Kidney disease Sister   . Colon cancer Neg Hx   . Prostate cancer Neg Hx    Social History   Socioeconomic History  . Marital status: Married    Spouse name: Not on file  . Number of children: 2  . Years of education: Not on file  . Highest education level: Not on file  Occupational History  . Occupation: retired Tour manager  . Financial resource strain: Not hard at all  . Food insecurity:    Worry: Never true    Inability: Never true  . Transportation needs:    Medical: No    Non-medical: No  Tobacco Use  . Smoking status: Former Research scientist (life sciences)  . Smokeless tobacco: Never Used  Substance and Sexual Activity  . Alcohol use: Yes    Alcohol/week: 0.0 standard drinks    Comment: occasional  . Drug use: No  . Sexual activity: Not Currently  Lifestyle  . Physical activity:    Days per week: 5 days    Minutes per session: 60 min  . Stress: Not at all  Relationships  . Social connections:    Talks on phone: Not on file    Gets together: Not on file    Attends religious service: Not on file    Active member of club or organization: Not on file    Attends meetings of clubs or organizations: Not on file    Relationship status: Not on file  Other Topics Concern  . Not on file  Social History Narrative  . Not on file    Outpatient Encounter Medications as of 08/09/2018  Medication Sig  . ALPRAZolam (XANAX) 0.25 MG tablet Take 1 tablet (0.25 mg total) by mouth daily as needed.  Marland Kitchen amLODipine (NORVASC) 10 MG tablet Take 1 tablet (10 mg total) by mouth daily.  Marland Kitchen aspirin 81 MG  tablet Take 81 mg by mouth daily.  Marland Kitchen atorvastatin (LIPITOR) 10 MG tablet TAKE 1 TABLET BY MOUTH ONCE DAILY.  Marland Kitchen Azelastine-Fluticasone (DYMISTA NA) Place 2 puffs into the nose daily.  . clopidogrel (PLAVIX) 75 MG tablet Take 1 tablet (75 mg total) by mouth daily.  Marland Kitchen glipiZIDE (GLUCOTROL XL) 2.5 MG 24 hr tablet TAKE 3 TABLETS BY MOUTH ONCE EVERY MORNING  . glucose blood test strip Accu-Chek Aviva Plus test strips  . hydrochlorothiazide (HYDRODIURIL) 25 MG tablet Take 2 tablets (50 mg total) by mouth daily.  Marland Kitchen levocetirizine (XYZAL) 5 MG tablet Take 5 mg by mouth every evening.  Marland Kitchen lisinopril (PRINIVIL,ZESTRIL) 40 MG tablet Take 1 tablet (40 mg total) by mouth daily.  . metFORMIN (GLUCOPHAGE) 1000 MG tablet TAKE 1 TABLET BY MOUTH TWICE DAILY WITH MEALS  . metoprolol succinate (TOPROL-XL) 25 MG 24 hr tablet Take 1 tablet (25 mg total) by mouth 2 (two) times daily.  . montelukast (SINGULAIR) 10 MG tablet Take 10 mg by mouth at bedtime.  . Multiple Vitamin (MULTIVITAMIN) tablet Take 1 tablet by mouth daily.  . Omega-3 Fatty Acids (FISH OIL) 1200 MG CAPS Take by mouth 4 (four) times daily.  . pantoprazole (PROTONIX) 20 MG tablet Take 1 tablet (20 mg total) by mouth daily.  . pioglitazone (ACTOS) 30 MG tablet Take 1 tablet (30 mg total) by mouth daily.  . sertraline (ZOLOFT) 50 MG tablet Take 1 tablet (50 mg total) by mouth daily.  . tamsulosin (FLOMAX) 0.4 MG CAPS capsule Take 0.4 mg by mouth daily with breakfast.   . testosterone cypionate (DEPOTESTOTERONE CYPIONATE) 200 MG/ML injection Inject 200 mg into the muscle every 14 (fourteen) days.  . traZODone (DESYREL) 50 MG tablet TAKE 1 and 1/2 TABLETS BY MOUTH AT BEDTIME  . [DISCONTINUED] ACCU-CHEK AVIVA PLUS test strip USE TO TEST BLOOD SUGAR TWICE DAILY  . [DISCONTINUED] ALPRAZolam (XANAX) 0.25 MG tablet Take 1 tablet (0.25 mg total) by mouth daily as needed.  . [DISCONTINUED] clopidogrel (PLAVIX) 75 MG tablet TAKE 1 TABLET BY MOUTH ONCE DAILY  .  [DISCONTINUED] colchicine 0.6 MG tablet Take 1 tablet (0.6 mg total) by mouth 2 (two) times daily.  . [DISCONTINUED] glipiZIDE (GLUCOTROL XL) 2.5 MG 24 hr tablet TAKE 3 TABLETS BY MOUTH ONCE  EVERY MORNING  . [DISCONTINUED] hydrochlorothiazide (HYDRODIURIL) 25 MG tablet TAKE 2 TABLETS BY MOUTH ONCE DAILY  . [DISCONTINUED] lisinopril (PRINIVIL,ZESTRIL) 40 MG tablet TAKE 1 TABLET BY MOUTH ONCE DAILY.  . [DISCONTINUED] metoprolol succinate (TOPROL-XL) 25 MG 24 hr tablet TAKE 1 TABLET BY MOUTH TWICE DAILY  . [DISCONTINUED] pantoprazole (PROTONIX) 20 MG tablet Take 1 tablet (20 mg total) by mouth daily.  . [DISCONTINUED] pioglitazone (ACTOS) 30 MG tablet Take 1 tablet (30 mg total) by mouth daily.   Facility-Administered Encounter Medications as of 08/09/2018  Medication  . methylPREDNISolone acetate (DEPO-MEDROL) injection 40 mg    Review of Systems  Constitutional: Negative for appetite change and unexpected weight change.  HENT: Negative for congestion and sinus pressure.   Respiratory: Negative for cough, chest tightness and shortness of breath.   Cardiovascular: Negative for chest pain, palpitations and leg swelling.  Gastrointestinal: Negative for abdominal pain, diarrhea, nausea and vomiting.  Genitourinary: Negative for difficulty urinating and dysuria.  Musculoskeletal: Negative for myalgias.       Pain and swelling base of third and fourth finger left hand.   Skin: Negative for color change and rash.  Neurological: Negative for dizziness, light-headedness and headaches.  Psychiatric/Behavioral: Negative for agitation and dysphoric mood.       Objective:    Physical Exam Constitutional:      General: He is not in acute distress.    Appearance: Normal appearance. He is well-developed.  HENT:     Nose: Nose normal. No congestion.     Mouth/Throat:     Pharynx: No oropharyngeal exudate or posterior oropharyngeal erythema.  Neck:     Musculoskeletal: Neck supple. No muscular  tenderness.  Cardiovascular:     Rate and Rhythm: Normal rate and regular rhythm.  Pulmonary:     Effort: Pulmonary effort is normal. No respiratory distress.     Breath sounds: Normal breath sounds.  Abdominal:     General: Bowel sounds are normal.     Palpations: Abdomen is soft.     Tenderness: There is no abdominal tenderness.  Musculoskeletal:     Comments: Increased soft tissue swelling - base of third and fourth finger.  Increased pain to palpation.  Increased pain with making a fist.  No pain to palpation over the fingers.   Lymphadenopathy:     Cervical: No cervical adenopathy.  Skin:    Findings: No erythema or rash.  Neurological:     Mental Status: He is alert.  Psychiatric:        Mood and Affect: Mood normal.        Behavior: Behavior normal.     BP 128/76   Pulse 66   Temp 98 F (36.7 C) (Oral)   Resp 16   Wt 226 lb (102.5 kg)   SpO2 96%   BMI 34.36 kg/m  Wt Readings from Last 3 Encounters:  08/09/18 226 lb (102.5 kg)  06/16/18 226 lb 12.8 oz (102.9 kg)  04/02/18 222 lb (100.7 kg)     Lab Results  Component Value Date   WBC 7.5 03/31/2018   HGB 14.9 03/31/2018   HCT 44.2 03/31/2018   PLT 247.0 03/31/2018   GLUCOSE 174 (H) 08/06/2018   CHOL 141 08/06/2018   TRIG 352.0 (H) 08/06/2018   HDL 32.30 (L) 08/06/2018   LDLDIRECT 79.0 08/06/2018   LDLCALC 58 11/21/2013   ALT 16 08/06/2018   AST 17 08/06/2018   NA 139 08/06/2018   K 3.9 08/06/2018   CL  102 08/06/2018   CREATININE 0.90 08/06/2018   BUN 18 08/06/2018   CO2 25 08/06/2018   TSH 0.51 11/17/2017   PSA 0.4 08/11/2014   HGBA1C 7.6 (H) 08/06/2018   MICROALBUR 2.0 (H) 08/06/2018       Assessment & Plan:   Problem List Items Addressed This Visit    Anemia, iron deficiency    Follow cbc.       Diabetes mellitus ([place])    Reviewed outside sugar readings.  Appear to be stable.  Low carb diet.  Has not been able to go to the gym.  Discussed diet and exercise.  Follow met b and a1c.         Relevant Medications   pioglitazone (ACTOS) 30 MG tablet   glipiZIDE (GLUCOTROL XL) 2.5 MG 24 hr tablet   lisinopril (PRINIVIL,ZESTRIL) 40 MG tablet   Frequent sinus infections    Recently saw ENT.  Treated.  Doing well now.  Follow.        GERD (gastroesophageal reflux disease)    Controlled on current regimen.  Follow.      Relevant Medications   pantoprazole (PROTONIX) 20 MG tablet   Hypercholesterolemia    On lipitor.  Low cholesterol diet and exercise.  Follow lipid panel and liver function tests.        Relevant Medications   metoprolol succinate (TOPROL-XL) 25 MG 24 hr tablet   hydrochlorothiazide (HYDRODIURIL) 25 MG tablet   lisinopril (PRINIVIL,ZESTRIL) 40 MG tablet   Hypertension    Blood pressure under good control.  Continue same medication regimen.  Follow pressures.  Follow metabolic panel.        Relevant Medications   metoprolol succinate (TOPROL-XL) 25 MG 24 hr tablet   hydrochlorothiazide (HYDRODIURIL) 25 MG tablet   lisinopril (PRINIVIL,ZESTRIL) 40 MG tablet   Left carotid bruit    Had carotids evaluated.  States ok.  Follow.        Left hand pain    S/p injury.  Pain and swelling as outlined.  Discussed xray.  Desires ortho evaluation.        Obstructive sleep apnea    Using CPAP regularly.  Uses at least 8 hours a night.  Sleep improved.  Follow.            Einar Pheasant, MD

## 2018-08-09 NOTE — Assessment & Plan Note (Signed)
S/p injury.  Pain and swelling as outlined.  Discussed xray.  Desires ortho evaluation.

## 2018-08-10 ENCOUNTER — Encounter: Payer: Self-pay | Admitting: Internal Medicine

## 2018-08-11 ENCOUNTER — Telehealth: Payer: Self-pay | Admitting: Internal Medicine

## 2018-08-11 NOTE — Telephone Encounter (Signed)
-----   Message from De Hollingshead, [facility] sent at 08/11/2018  9:22 AM EDT ----- Regarding: RE: medication adjustment I'll reach out and schedule a phone visit for Monday! Thanks!  Catie ----- Message ----- From: Einar Pheasant, MD Sent: 08/11/2018   4:19 AM EDT To: De Hollingshead, [facility] Subject: medication adjustment                          Mr Conran has been on his current medication regimen for a long time.  (he is currently on actos, metformin and glipizide).  His sugars have been relatively well controlled and so we have not made adjustments.  Recently his a1c has increased.  He has not been able to go to the gym, but is still trying to exercise.  He is motivated.  I know you are not seeing pts in the office.  Are you doing phone visits?  If you are, he would like to participate.  If not, I would like your input regarding cost effective treatments for him.  Thanks again for your help.   Einar Pheasant

## 2018-08-11 NOTE — Telephone Encounter (Signed)
My chart message sent to pt to inform him that Catie would be contacting him for phone visit.

## 2018-08-13 ENCOUNTER — Telehealth: Payer: Self-pay | Admitting: Internal Medicine

## 2018-08-13 NOTE — Telephone Encounter (Signed)
Copied from [place] [id number]. Topic: General - Other >> Aug 13, 2018  2:30 PM Lennox Solders wrote: Reason for CRM: renee is calling and needs last office note from dr scott for his cpap. Pt saw dr Nicki Reaper on 08-09-2018. Dr Nicki Reaper referral [facility]. Please fax [phone] . Pt has [employer]

## 2018-08-16 NOTE — Telephone Encounter (Signed)
Note faxed.

## 2018-08-23 ENCOUNTER — Encounter: Payer: Self-pay | Admitting: Pharmacist

## 2018-08-23 ENCOUNTER — Ambulatory Visit: Payer: Medicare Other | Admitting: Pharmacist

## 2018-08-23 ENCOUNTER — Telehealth: Payer: Self-pay | Admitting: Pharmacist

## 2018-08-23 DIAGNOSIS — E119 Type 2 diabetes mellitus without complications: Secondary | ICD-10-CM

## 2018-08-23 MED ORDER — DULAGLUTIDE 0.75 MG/0.5ML ~~LOC~~ SOAJ: 0.7500 mg | SUBCUTANEOUS | 0 refills | Status: DC

## 2018-08-23 MED ORDER — GLIPIZIDE ER 2.5 MG PO TB24: 5.0000 mg | ORAL_TABLET | Freq: Every day | ORAL | 0 refills | Status: DC

## 2018-08-23 NOTE — Telephone Encounter (Signed)
Reviewed information.  Agree with assessment and plan.    Dr Yaresly Menzel 

## 2018-08-23 NOTE — Telephone Encounter (Signed)
S:     Chief Complaint  Patient presents with  . Medication Management    Diabetes    Patient contacted telephonically for diabetes evaluation, education, and management at the request of Dr. Nicki Reaper (referred on 08/10/2018 MyChart message) Last seen by primary care provider on 08/09/2018.   Today, he notes that he is frustrated with his increase in A1c, as he has been continuing to eat well and exercise daily. He notes that he has been on this regimen of medications for "years and years". Denies LEE w/ pioglitazone therapy. Reports some episodes of hypoglycemia.   Hx nodule with biopsy, benign.   Patient reports Diabetes was diagnosed at least 15 years ago  Insurance coverage/medication affordability: [employer]   Patient reports adherence with medications.  Current diabetes medications include: metformin 1000 mg BID, glipizide XL 7.5 mg daily, pioglitazone 30 mg  Current hypertension medications include: HCTZ 25 mg daily, lisinopril 40 mg daily, metoprolol succinate 25 mg BID Current hyperlipidemic medications include: atorvastatin 10 mg daily, omega-3-fatty acids  Patient reports hypoglycemic s/sx including thirstiness/nervousness, but very infrequent (if happens, then happens in the early afternoon if he skips meals or has a smaller breakfast). Patient reports hyperglycemic symptoms including polyuria and polyphagia.   Patient reported dietary habits: Eats 2-3 meals/day Breakfast: Doesn't eat a lot of breakfast; poached/boiled eggs; sometimes biscuit; sometimes protein shake; coffee Lunch: Salad (lettuce and tomato w/ vinaigrette); leftovers; sometimes a sandwich w/ no sides; unsweet tea or diet coke Dinner: Meat + 2 vegetables; chicken, lean pork and beef, fish; beans, broccoli, brussels sprouts, peas, cabbage  Snacks: Not often, nuts, occasional sweet  Patient reported exercise habits: Daily, goes to a local park and stretches + walks; lifts some  dumbbells (low weights); focused on maintaining range of motion   O:   Lab Results  Component Value Date   HGBA1C 7.6 (H) 25/42/7062   Basic Metabolic Panel BMP Latest Ref Rng & Units 08/06/2018 03/31/2018 11/17/2017  Glucose 70 - 99 mg/dL 174(H) 172(H) 158(H)  BUN 6 - 23 mg/dL 18 19 19   Creatinine 0.40 - 1.50 mg/dL 0.90 0.94 0.84  Sodium 135 - 145 mEq/L 139 136 140  Potassium 3.5 - 5.1 mEq/L 3.9 3.9 4.0  Chloride 96 - 112 mEq/L 102 100 98  CO2 19 - 32 mEq/L 25 26 30   Calcium 8.4 - 10.5 mg/dL 8.8 9.5 9.8   Lipid Panel     Component Value Date/Time   CHOL 141 08/06/2018 0815   TRIG 352.0 (H) 08/06/2018 0815   HDL 32.30 (L) 08/06/2018 0815   CHOLHDL 4 08/06/2018 0815   VLDL 70.4 (H) 08/06/2018 0815   LDLCALC 58 11/21/2013 0831   LDLDIRECT 79.0 08/06/2018 0815    Self-Monitored Blood Glucose Results Fasting SMBG: 120-173 Bedtime (anywhere from 2-4 hours after meals): generally 150-180s  Clinical ASCVD: Yes - hx TIA  A/P: #Diabetes - Currently uncontrolled, most recent A1c 7.6% on 08/06/2018, worsened from 7.1% on 03/20/2018; Goal A1c <7%. Appropriate to add ASCVD risk-reducing medication, reduce hypoglycemia-prone medications. Discussed benefit vs side effects of GLP1 vs SGLT2, patient decided to start GLP1. Either option Tier 2 ($40/30 day supply) on insurance.  - Start Trulicity 3.76 mg once weekly. Counseled patient on side effects - Reduce glipizide XL to 5 mg once daily. Low threshold for continued taper moving forward.  - Continue metformin 1000 mg BID and pioglitazone 30 mg daily.  - Encouraged continued exercise and carbohydrate monitoring  - Educated patient to  continue to check fasting BG, but add 2 hour post prandial checks  #ASCVD risk - secondary prevention in patient aged 42-75 with DM and elevated TG. Most recent LDL <100, though slightly higher than 70 - In the future, consider increasing atorvastatin to high intensity for secondary prevention. Also consider  changing omega-3-fatty acids for superior ASCVD risk reduction with Vascepa (icosapent ethyl), based on patient affordability. Dalene Seltzer is currently Tier 3 on his insurance ($64 copay/30 days) - Continue aspirin + clopidogrel  Written patient instructions provided.     Follow up in Pharmacist Clinic Visit 4 weeks.  De Hollingshead, PharmD, [place] PGY2 Ambulatory Care Pharmacy Resident Phone: [phone]

## 2018-09-20 ENCOUNTER — Telehealth: Payer: Self-pay | Admitting: Pharmacist

## 2018-09-20 ENCOUNTER — Encounter: Payer: Self-pay | Admitting: Pharmacist

## 2018-09-20 ENCOUNTER — Ambulatory Visit: Payer: Medicare Other | Admitting: Pharmacist

## 2018-09-20 DIAGNOSIS — E119 Type 2 diabetes mellitus without complications: Secondary | ICD-10-CM

## 2018-09-20 MED ORDER — DULAGLUTIDE 0.75 MG/0.5ML ~~LOC~~ SOAJ: 0.7500 mg | SUBCUTANEOUS | 1 refills | Status: DC

## 2018-09-20 NOTE — Telephone Encounter (Signed)
Reviewed above information.  Agree with assessment and plan.    Dr Madisson Kulaga 

## 2018-09-20 NOTE — Telephone Encounter (Addendum)
S:     Chief Complaint  Patient presents with  . Medication Management    Diabetes    Patient contacted telephonically for diabetes evaluation, education, and management at the request of Dr. Nicki Reaper (referred on 08/10/2018 MyChart message) Last seen by primary care provider on 08/09/2018. Last pharmacy clinic visit 08/23/18 and he was started on Trulicity at that time.   Ricardo Nash is very pleased with his blood glucose readings. He reports a 10 lb weight loss since March. Reports that he "doesn't have much of an appetite" since Trulicity initiation, but that he doesn't mind this. Notes some GI upset when first starting, but this has resolved.   He does report one episode of hypoglycemia, he states is blood sugar was 61 and he felt shaky and thirsty. He states he did not eat a large breakfast and waited a long time to eat lunch. He was able to eat a few candy bars and his hypoglycemia was corrected.   Patient reports Diabetes was diagnosed at least 15 years ago  Insurance coverage/medication affordability: [employer]   Patient reports adherence with medications.  Current diabetes medications include: metformin 1000 mg BID, glipizide XL 5 mg daily, pioglitazone 30 mg  Current hypertension medications include: HCTZ 25 mg daily, lisinopril 40 mg daily, metoprolol succinate 25 mg BID Current hyperlipidemic medications include: atorvastatin 10 mg daily, omega-3-fatty acids  Patient reports hypoglycemic s/sx including shakiness, sweating, and thrist. Patient denies hyperglycemic symptoms including polyuria, polydipsia, polyphagia, nocturia, neuropathy, blurred vision. Patient reports self foot exams.   Patient reported dietary habits: Eats 3 meals/day Breakfast: protein shake or oat meal  Lunch: Lean Cuisinefrozen meal, tomato sandwich, chicken salad  Dinner:meat, (lean pork, beef, fish), veggies  Snacks:before bed time - cereal, gram cracker  Drinks: no sugary  drink, unsweet tea, water, juice occasionally   Patient reported exercise habits: walks 2 miles a day 5 days a week, exercise programs at home daily. Looking forward to go back [facility] once [facility].    O:   Lab Results  Component Value Date   HGBA1C 7.6 (H) 99/83/3825    Basic Metabolic Panel BMP Latest Ref Rng & Units 08/06/2018 03/31/2018 11/17/2017  Glucose 70 - 99 mg/dL 174(H) 172(H) 158(H)  BUN 6 - 23 mg/dL 18 19 19   Creatinine 0.40 - 1.50 mg/dL 0.90 0.94 0.84  Sodium 135 - 145 mEq/L 139 136 140  Potassium 3.5 - 5.1 mEq/L 3.9 3.9 4.0  Chloride 96 - 112 mEq/L 102 100 98  CO2 19 - 32 mEq/L 25 26 30   Calcium 8.4 - 10.5 mg/dL 8.8 9.5 9.8     Lipid Panel     Component Value Date/Time   CHOL 141 08/06/2018 0815   TRIG 352.0 (H) 08/06/2018 0815   HDL 32.30 (L) 08/06/2018 0815   CHOLHDL 4 08/06/2018 0815   VLDL 70.4 (H) 08/06/2018 0815   LDLCALC 58 11/21/2013 0831   LDLDIRECT 79.0 08/06/2018 0815    Self-Monitored Blood Glucose Results  fasting post-prand  11-Apr 136 111  13-Apr 129 127  14-Apr 125 99  15-Apr 101 118  16-Apr 125 108  17-Apr 108 161  18-Apr 130 111  19-Apr 124 118  20-Apr 114 123  21-Apr 106 111  22-Apr 113 100  23-Apr 101 136  24-Apr 112 99  25-Apr 107 120  26-Apr 109 160  27-Apr 111 99  28-Apr 97 105  29-Apr 107 101  30-Apr 131 84  1-May 113 87  2-May 119 120  3-May 118 145  4-May 103 151  5-May 102 110  6-May 117 67  7-May 105 123  8-May 112 126  9-May 110 170  10-May 96   average 113 118   A/P: #Diabetes - Currently uncontrolled but much improved per SMBG results, most recent A1c 7.6% on 08/06/18, worsened from 7.1% on 03/31/18; Goal A1c <7%. Patient reports 1 hypoglycemic event; appropriate to d/c hypoglycemic-prone medication - Stop glipizide  - Continue metformin 1000mg  BID, Trulicity 0.75mg  weekly and pioglitazone 30mg  daily  - Consider discontinuing pioglitazone in the future if blood glucose remains controlled, or if  appropriate to increase Trulicity to maximal dosing. - Refer back to provider; follow up with pharmacy clinic in the future if needed  Written patient instructions provided.  Total time in telephonic counseling 35 minutes.    Follow up in Pharmacist Clinic Visit as needed.   Patient seen with Isaias Sakai, PharmD, PGY1.   Catie Darnelle Maffucci, PharmD, [place] PGY2 Ambulatory Care Pharmacy Resident Phone: [phone]

## 2018-09-27 ENCOUNTER — Other Ambulatory Visit: Payer: Self-pay | Admitting: Internal Medicine

## 2018-09-28 NOTE — Telephone Encounter (Signed)
rx ok'd for alprazolam #30 with one refill.   

## 2018-09-28 NOTE — Telephone Encounter (Signed)
Refilled: 08/09/2018 Last OV: 08/09/2018 Next OV: 10/07/2018

## 2018-10-07 ENCOUNTER — Ambulatory Visit (INDEPENDENT_AMBULATORY_CARE_PROVIDER_SITE_OTHER): Payer: Medicare Other | Admitting: Internal Medicine

## 2018-10-07 ENCOUNTER — Encounter: Payer: Self-pay | Admitting: Internal Medicine

## 2018-10-07 ENCOUNTER — Other Ambulatory Visit: Payer: Self-pay

## 2018-10-07 DIAGNOSIS — D509 Iron deficiency anemia, unspecified: Secondary | ICD-10-CM | POA: Diagnosis not present

## 2018-10-07 DIAGNOSIS — K219 Gastro-esophageal reflux disease without esophagitis: Secondary | ICD-10-CM

## 2018-10-07 DIAGNOSIS — E1159 Type 2 diabetes mellitus with other circulatory complications: Secondary | ICD-10-CM | POA: Diagnosis not present

## 2018-10-07 DIAGNOSIS — E7800 Pure hypercholesterolemia, unspecified: Secondary | ICD-10-CM

## 2018-10-07 DIAGNOSIS — I10 Essential (primary) hypertension: Secondary | ICD-10-CM

## 2018-10-07 DIAGNOSIS — G4733 Obstructive sleep apnea (adult) (pediatric): Secondary | ICD-10-CM

## 2018-10-07 DIAGNOSIS — F419 Anxiety disorder, unspecified: Secondary | ICD-10-CM

## 2018-10-07 MED ORDER — PIOGLITAZONE HCL 15 MG PO TABS: 15.0000 mg | ORAL_TABLET | Freq: Every day | ORAL | 1 refills | Status: DC

## 2018-10-07 MED ORDER — SERTRALINE HCL 50 MG PO TABS: 50.0000 mg | ORAL_TABLET | Freq: Every day | ORAL | 1 refills | Status: DC

## 2018-10-07 NOTE — Progress Notes (Signed)
Patient ID: Ricardo Nash, male   DOB: January 26, 1946, 73 y.o.   MRN: [id number]   Virtual Visit via video Note  This visit type was conducted due to national recommendations for restrictions regarding the COVID-19 pandemic (e.g. social distancing).  This format is felt to be most appropriate for this patient at this time.  All issues noted in this document were discussed and addressed.  No physical exam was performed (except for noted visual exam findings with Video Visits).   I connected with Ricardo Nash by a video enabled telemedicine application and verified that I am speaking with the correct person using two identifiers. Location patient: home Location provider: work  Persons participating in the virtual visit: patient, provider  I discussed the limitations, risks, security and privacy concerns of performing an evaluation and management service by video and the availability of in person appointments.  The patient expressed understanding and agreed to proceed.   Reason for visit: scheduled follow up.   HPI: He reports he is doing better.  Feels better.  Last visit, a1c elevated.  Started on trulicity.  Sugars much improved.  AM sugars:  101-125.  Two hours after eating - 160.  Has lost weight.  Exercising.  Walking two miles per day.  Watching his diet.  No chest pain.  No sob.  No acid reflux.  No abdominal pain.  Taking stool softener.  Bowels doing ok on this regimen.  zoloft working well.  Feels better on the medication.  Does request refill on xanax to have to take prn.  Discussed further medication changes since sugars doing well.     ROS: See pertinent positives and negatives per HPI.  Past Medical History:  Diagnosis Date  . C. difficile colitis   . Coronary artery disease   . Depression   . Diabetes mellitus ([place])   . Diverticulitis   . Diverticulosis   . Environmental allergies   . GERD (gastroesophageal reflux disease)   . Goiter    intrathoracic, s/p benign  biopsy (Dr Harlow Asa)  . Hypercholesterolemia   . Hypertension   . Osteoarthritis    cervical spine, lumbar spine  . Paroxysmal atrial fibrillation (HCC)   . PONV (postoperative nausea and vomiting)   . Sleep apnea   . Stroke ([place])   . TIA (transient ischemic attack)     Past Surgical History:  Procedure Laterality Date  . BACK SURGERY  8/08   s/p fusion of L4-L5  . capsule endoscopy    . CATARACT EXTRACTION  2011   Dr. Herbert Deaner  . [place] SURGERY  2002  . COLONOSCOPY WITH PROPOFOL N/A 07/20/2017   Procedure: COLONOSCOPY WITH PROPOFOL;  Surgeon: Manya Silvas, MD;  Location: [facility] ENDOSCOPY;  Service: Endoscopy;  Laterality: N/A;  . EYE SURGERY     cataract bilateral 10/1999  . eyelid reduction     Dr. Carlis Abbott  . INGUINAL HERNIA REPAIR  1991   Dr, Tamala Julian  . NOSE SURGERY     turbinate reduction  . SEPTOPLASTY  1975  . SHOULDER SURGERY  2000   rotator cuff    Family History  Problem Relation Age of Onset  . Congestive Heart Failure Father   . Heart disease Father        myocardial infarction  . Rheumatic fever Father        valvular disease  . Heart disease Mother        s/p CABG (age 62)  . Kidney disease Sister   .  Colon cancer Neg Hx   . Prostate cancer Neg Hx     SOCIAL HX: reviewed.    Current Outpatient Medications:  .  ALPRAZolam (XANAX) 0.25 MG tablet, TAKE 1 TABLET BY MOUTH ONCE DAILY AS NEEDED, Disp: 30 tablet, Rfl: 1 .  amLODipine (NORVASC) 10 MG tablet, Take 1 tablet (10 mg total) by mouth daily., Disp: 90 tablet, Rfl: 2 .  Ascorbic Acid (VITAMIN C) 100 MG tablet, Take 100 mg by mouth daily., Disp: , Rfl:  .  aspirin 81 MG tablet, Take 81 mg by mouth daily., Disp: , Rfl:  .  atorvastatin (LIPITOR) 10 MG tablet, TAKE 1 TABLET BY MOUTH ONCE DAILY., Disp: 90 tablet, Rfl: 3 .  Azelastine-Fluticasone (DYMISTA NA), Place 2 puffs into the nose daily., Disp: , Rfl:  .  clopidogrel (PLAVIX) 75 MG tablet, Take 1 tablet (75 mg total) by mouth daily., Disp: 90  tablet, Rfl: 0 .  Dulaglutide (TRULICITY) 9.81 XB/1.4NW SOPN, Inject 0.75 mg into the skin once a week., Disp: 12 pen, Rfl: 1 .  glucose blood test strip, Accu-Chek Aviva Plus test strips, Disp: , Rfl:  .  hydrochlorothiazide (HYDRODIURIL) 25 MG tablet, Take 2 tablets (50 mg total) by mouth daily. (Patient taking differently: Take 25 mg by mouth daily. ), Disp: 90 tablet, Rfl: 0 .  levocetirizine (XYZAL) 5 MG tablet, Take 5 mg by mouth every evening., Disp: , Rfl:  .  lisinopril (PRINIVIL,ZESTRIL) 40 MG tablet, Take 1 tablet (40 mg total) by mouth daily., Disp: 90 tablet, Rfl: 3 .  metFORMIN (GLUCOPHAGE) 1000 MG tablet, TAKE 1 TABLET BY MOUTH TWICE DAILY WITH MEALS, Disp: 180 tablet, Rfl: 11 .  metoprolol succinate (TOPROL-XL) 25 MG 24 hr tablet, Take 1 tablet (25 mg total) by mouth 2 (two) times daily., Disp: 180 tablet, Rfl: 1 .  montelukast (SINGULAIR) 10 MG tablet, Take 10 mg by mouth at bedtime., Disp: , Rfl:  .  Multiple Vitamin (MULTIVITAMIN) tablet, Take 1 tablet by mouth daily., Disp: , Rfl:  .  Omega-3 Fatty Acids (FISH OIL) 1200 MG CAPS, Take by mouth. 3 daily, Disp: , Rfl:  .  pantoprazole (PROTONIX) 20 MG tablet, Take 1 tablet (20 mg total) by mouth daily., Disp: 90 tablet, Rfl: 2 .  pioglitazone (ACTOS) 15 MG tablet, Take 1 tablet (15 mg total) by mouth daily., Disp: 90 tablet, Rfl: 1 .  sertraline (ZOLOFT) 50 MG tablet, Take 1 tablet (50 mg total) by mouth daily., Disp: 90 tablet, Rfl: 1 .  tamsulosin (FLOMAX) 0.4 MG CAPS capsule, Take 0.4 mg by mouth daily with breakfast. , Disp: , Rfl:  .  testosterone cypionate (DEPOTESTOTERONE CYPIONATE) 200 MG/ML injection, Inject 200 mg into the muscle every 14 (fourteen) days., Disp: , Rfl:  .  traZODone (DESYREL) 50 MG tablet, TAKE 1 and 1/2 TABLETS BY MOUTH AT BEDTIME, Disp: 45 tablet, Rfl: 3 .  vitamin B-12 (CYANOCOBALAMIN) 100 MCG tablet, Take 100 mcg by mouth daily., Disp: , Rfl:   EXAM:  VITALS per patient if applicable:  Weight today  207 pounds.  Blood sugar this am 108.   GENERAL: alert, oriented, appears well and in no acute distress  HEENT: atraumatic, conjunttiva clear, no obvious abnormalities on inspection of external nose and ears  NECK: normal movements of the head and neck  LUNGS: on inspection no signs of respiratory distress, breathing rate appears normal, no obvious gross SOB, gasping or wheezing  CV: no obvious cyanosis  PSYCH/NEURO: pleasant and cooperative, no obvious  depression or anxiety, speech and thought processing grossly intact  ASSESSMENT AND PLAN:  Discussed the following assessment and plan:  Iron deficiency anemia, unspecified iron deficiency anemia type  Type 2 diabetes mellitus with other circulatory complication, without long-term current use of insulin (HCC) - Plan: Hemoglobin F5O, Basic metabolic panel  Gastroesophageal reflux disease, esophagitis presence not specified  Hypercholesterolemia - Plan: Lipid panel, Hepatic function panel  Essential hypertension - Plan: TSH  Obstructive sleep apnea  Anxiety  Anemia, iron deficiency Follow cbc.   Diabetes mellitus Has adjusted diet.  Started trulicity.  Sugars as outlined.  Has lost weight.  Discussed adjusting medication.  Will decrease actos to 84m q day.  Follow sugars.  Discussed possibly being able to stop actos.  Follow met b and a1c.   GERD (gastroesophageal reflux disease) Controlled on current regimen.  Follow.   Hypercholesterolemia On lipitor.  Low cholesterol diet and exercise.  Follow lipid panel and liver function tests.   Hypertension Blood pressure has been under good control.  Continue current medication regimen.  Follow pressures.  Follow metabolic panel.    Obstructive sleep apnea Continue CPAP.   Anxiety Started on zoloft previously.  Doing well on zoloft.  Follow.  Feels better.     I discussed the assessment and treatment plan with the patient. The patient was provided an opportunity to ask  questions and all were answered. The patient agreed with the plan and demonstrated an understanding of the instructions.   The patient was advised to call back or seek an in-person evaluation if the symptoms worsen or if the condition fails to improve as anticipated.    CEinar Pheasant MD

## 2018-10-10 ENCOUNTER — Encounter: Payer: Self-pay | Admitting: Internal Medicine

## 2018-10-10 DIAGNOSIS — F419 Anxiety disorder, unspecified: Secondary | ICD-10-CM | POA: Insufficient documentation

## 2018-10-10 NOTE — Assessment & Plan Note (Signed)
Blood pressure has been under good control.  Continue current medication regimen.  Follow pressures.  Follow metabolic panel.  

## 2018-10-10 NOTE — Assessment & Plan Note (Signed)
Has adjusted diet.  Started trulicity.  Sugars as outlined.  Has lost weight.  Discussed adjusting medication.  Will decrease actos to 15mg q day.  Follow sugars.  Discussed possibly being able to stop actos.  Follow met b and a1c.  

## 2018-10-10 NOTE — Assessment & Plan Note (Signed)
Follow cbc.  

## 2018-10-10 NOTE — Assessment & Plan Note (Signed)
On lipitor.  Low cholesterol diet and exercise.  Follow lipid panel and liver function tests.   

## 2018-10-10 NOTE — Assessment & Plan Note (Signed)
Controlled on current regimen.  Follow.  

## 2018-10-10 NOTE — Assessment & Plan Note (Signed)
Started on zoloft previously.  Doing well on zoloft.  Follow.  Feels better.

## 2018-10-10 NOTE — Assessment & Plan Note (Signed)
Continue CPAP.  

## 2018-10-22 ENCOUNTER — Other Ambulatory Visit: Payer: Self-pay | Admitting: Internal Medicine

## 2018-10-30 ENCOUNTER — Other Ambulatory Visit: Payer: Self-pay | Admitting: Internal Medicine

## 2018-11-16 ENCOUNTER — Other Ambulatory Visit: Payer: Self-pay | Admitting: Internal Medicine

## 2018-12-02 ENCOUNTER — Other Ambulatory Visit: Payer: Self-pay | Admitting: Internal Medicine

## 2019-01-18 ENCOUNTER — Encounter: Payer: Self-pay | Admitting: Internal Medicine

## 2019-01-20 MED ORDER — PANTOPRAZOLE SODIUM 40 MG PO TBEC: 40.0000 mg | DELAYED_RELEASE_TABLET | Freq: Every day | ORAL | 2 refills | Status: DC

## 2019-01-20 NOTE — Telephone Encounter (Signed)
rx sent in for protonix 40mg  #30 with 2 refills.

## 2019-01-28 ENCOUNTER — Other Ambulatory Visit: Payer: Self-pay | Admitting: Internal Medicine

## 2019-02-22 ENCOUNTER — Other Ambulatory Visit (INDEPENDENT_AMBULATORY_CARE_PROVIDER_SITE_OTHER): Payer: Medicare Other

## 2019-02-22 ENCOUNTER — Other Ambulatory Visit: Payer: Self-pay

## 2019-02-22 DIAGNOSIS — E7800 Pure hypercholesterolemia, unspecified: Secondary | ICD-10-CM | POA: Diagnosis not present

## 2019-02-22 DIAGNOSIS — E1159 Type 2 diabetes mellitus with other circulatory complications: Secondary | ICD-10-CM | POA: Diagnosis not present

## 2019-02-22 DIAGNOSIS — I10 Essential (primary) hypertension: Secondary | ICD-10-CM | POA: Diagnosis not present

## 2019-02-22 LAB — LIPID PANEL
Cholesterol: 219 mg/dL — ABNORMAL HIGH (ref 0–200)
HDL: 34.8 mg/dL — ABNORMAL LOW (ref >39.00)
NonHDL: 184.57
Total CHOL/HDL Ratio: 6
Triglycerides: 292 mg/dL — ABNORMAL HIGH (ref 0.0–149.0)
VLDL: 58.4 mg/dL — ABNORMAL HIGH (ref 0.0–40.0)

## 2019-02-22 LAB — BASIC METABOLIC PANEL
BUN: 18 mg/dL (ref 6–23)
CO2: 31 mEq/L (ref 19–32)
Calcium: 10.1 mg/dL (ref 8.4–10.5)
Chloride: 97 mEq/L (ref 96–112)
Creatinine, Ser: 0.94 mg/dL (ref 0.40–1.50)
GFR: 78.29 mL/min (ref >60.00)
Glucose, Bld: 130 mg/dL — ABNORMAL HIGH (ref 70–99)
Potassium: 4.1 mEq/L (ref 3.5–5.1)
Sodium: 140 mEq/L (ref 135–145)

## 2019-02-22 LAB — HEPATIC FUNCTION PANEL
ALT: 16 U/L (ref 0–53)
AST: 16 U/L (ref 0–37)
Albumin: 4.3 g/dL (ref 3.5–5.2)
Alkaline Phosphatase: 37 U/L — ABNORMAL LOW (ref 39–117)
Bilirubin, Direct: 0.2 mg/dL (ref 0.0–0.3)
Total Bilirubin: 0.9 mg/dL (ref 0.2–1.2)
Total Protein: 6.5 g/dL (ref 6.0–8.3)

## 2019-02-22 LAB — HEMOGLOBIN A1C: Hgb A1c MFr Bld: 6.5 % (ref 4.6–6.5)

## 2019-02-22 LAB — TSH: TSH: 0.72 u[IU]/mL (ref 0.35–4.50)

## 2019-02-22 LAB — LDL CHOLESTEROL, DIRECT: Direct LDL: 158 mg/dL

## 2019-02-23 ENCOUNTER — Other Ambulatory Visit: Payer: Self-pay | Admitting: Internal Medicine

## 2019-02-24 ENCOUNTER — Encounter: Payer: Self-pay | Admitting: Internal Medicine

## 2019-02-24 ENCOUNTER — Other Ambulatory Visit: Payer: Self-pay

## 2019-02-24 ENCOUNTER — Ambulatory Visit (INDEPENDENT_AMBULATORY_CARE_PROVIDER_SITE_OTHER): Payer: Medicare Other | Admitting: Internal Medicine

## 2019-02-24 VITALS — BP 130/72 | HR 72 | Temp 97.3°F | Resp 16 | Ht 69.0 in | Wt 206.0 lb

## 2019-02-24 DIAGNOSIS — E1159 Type 2 diabetes mellitus with other circulatory complications: Secondary | ICD-10-CM | POA: Diagnosis not present

## 2019-02-24 DIAGNOSIS — Z0000 Encounter for general adult medical examination without abnormal findings: Secondary | ICD-10-CM

## 2019-02-24 DIAGNOSIS — I10 Essential (primary) hypertension: Secondary | ICD-10-CM

## 2019-02-24 DIAGNOSIS — K219 Gastro-esophageal reflux disease without esophagitis: Secondary | ICD-10-CM

## 2019-02-24 DIAGNOSIS — D509 Iron deficiency anemia, unspecified: Secondary | ICD-10-CM

## 2019-02-24 DIAGNOSIS — G4733 Obstructive sleep apnea (adult) (pediatric): Secondary | ICD-10-CM

## 2019-02-24 DIAGNOSIS — F419 Anxiety disorder, unspecified: Secondary | ICD-10-CM | POA: Diagnosis not present

## 2019-02-24 DIAGNOSIS — Z125 Encounter for screening for malignant neoplasm of prostate: Secondary | ICD-10-CM

## 2019-02-24 DIAGNOSIS — E7800 Pure hypercholesterolemia, unspecified: Secondary | ICD-10-CM

## 2019-02-24 NOTE — Telephone Encounter (Signed)
Pt picked up his xanax yesterday. He had 1 refill left from May. Clarified with pharmacy, misunderstood patients request while he was in the office. This refill is for future refills to have on file at pharmacy.

## 2019-02-24 NOTE — Telephone Encounter (Signed)
rx ok'd for xanax #30 with no refills.   

## 2019-02-24 NOTE — Progress Notes (Signed)
Patient ID: Ricardo Nash, male   DOB: 1946/03/03, 73 y.o.   MRN: [id number]   Subjective:    Patient ID: Ricardo Nash, male    DOB: [birth year], 73 y.o.   MRN: [id number]  HPI  Patient here for his physical exam.  He reports he is doing better.  Feels better.  Exercising. Losing weight.  Watching his diet.  Going to the gym.  Exercising regularly.  No chest pain.  No sob.  On trulicity.  Off glipizide.  actos does decreased.  Sugars doing well.  Recent a1c 6.5.  Has been off lipitor.  Just missed taking.  Back to taking lipitor now.  On zoloft.  Feels working well.  Takes xanax prn.     Past Medical History:  Diagnosis Date  . C. difficile colitis   . Coronary artery disease   . Depression   . Diabetes mellitus ([place])   . Diverticulitis   . Diverticulosis   . Environmental allergies   . GERD (gastroesophageal reflux disease)   . Goiter    intrathoracic, s/p benign biopsy (Dr Harlow Asa)  . Hypercholesterolemia   . Hypertension   . Osteoarthritis    cervical spine, lumbar spine  . Paroxysmal atrial fibrillation (HCC)   . PONV (postoperative nausea and vomiting)   . Sleep apnea   . Stroke ([place])   . TIA (transient ischemic attack)    Past Surgical History:  Procedure Laterality Date  . BACK SURGERY  8/08   s/p fusion of L4-L5  . capsule endoscopy    . CATARACT EXTRACTION  2011   Dr. Herbert Deaner  . [place] SURGERY  2002  . COLONOSCOPY WITH PROPOFOL N/A 07/20/2017   Procedure: COLONOSCOPY WITH PROPOFOL;  Surgeon: Manya Silvas, MD;  Location: [facility] ENDOSCOPY;  Service: Endoscopy;  Laterality: N/A;  . EYE SURGERY     cataract bilateral 10/1999  . eyelid reduction     Dr. Carlis Abbott  . INGUINAL HERNIA REPAIR  1991   Dr, Tamala Julian  . NOSE SURGERY     turbinate reduction  . SEPTOPLASTY  1975  . SHOULDER SURGERY  2000   rotator cuff   Family History  Problem Relation Age of Onset  . Congestive Heart Failure Father   . Heart disease Father        myocardial infarction  .  Rheumatic fever Father        valvular disease  . Heart disease Mother        s/p CABG (age 17)  . Kidney disease Sister   . Colon cancer Neg Hx   . Prostate cancer Neg Hx    Social History   Socioeconomic History  . Marital status: Married    Spouse name: Not on file  . Number of children: 2  . Years of education: Not on file  . Highest education level: Not on file  Occupational History  . Occupation: retired Tour manager  . Financial resource strain: Not hard at all  . Food insecurity    Worry: Never true    Inability: Never true  . Transportation needs    Medical: No    Non-medical: No  Tobacco Use  . Smoking status: Former Research scientist (life sciences)  . Smokeless tobacco: Never Used  Substance and Sexual Activity  . Alcohol use: Yes    Alcohol/week: 0.0 standard drinks    Comment: occasional  . Drug use: No  . Sexual activity: Not Currently  Lifestyle  . Physical activity  Days per week: 5 days    Minutes per session: 60 min  . Stress: Not at all  Relationships  . Social Herbalist on phone: Not on file    Gets together: Not on file    Attends religious service: Not on file    Active member of club or organization: Not on file    Attends meetings of clubs or organizations: Not on file    Relationship status: Not on file  Other Topics Concern  . Not on file  Social History Narrative  . Not on file    Outpatient Encounter Medications as of 02/24/2019  Medication Sig  . ACCU-CHEK AVIVA PLUS test strip USE TO TEST BLOOD SUGAR TWICE DAILY  . amLODipine (NORVASC) 10 MG tablet TAKE 1 TABLET BY MOUTH ONCE DAILY  . Ascorbic Acid (VITAMIN C) 100 MG tablet Take 100 mg by mouth daily.  Marland Kitchen aspirin 81 MG tablet Take 81 mg by mouth daily.  Marland Kitchen atorvastatin (LIPITOR) 10 MG tablet TAKE 1 TABLET BY MOUTH ONCE DAILY.  Marland Kitchen Azelastine-Fluticasone (DYMISTA NA) Place 2 puffs into the nose daily.  . clopidogrel (PLAVIX) 75 MG tablet TAKE 1 TABLET BY MOUTH ONCE DAILY  .  Dulaglutide (TRULICITY) 2.11 HE/1.7EY SOPN Inject 0.75 mg into the skin once a week.  . hydrochlorothiazide (HYDRODIURIL) 25 MG tablet Take 2 tablets (50 mg total) by mouth daily. (Patient taking differently: Take 25 mg by mouth daily. )  . levocetirizine (XYZAL) 5 MG tablet Take 5 mg by mouth every evening.  Marland Kitchen lisinopril (PRINIVIL,ZESTRIL) 40 MG tablet Take 1 tablet (40 mg total) by mouth daily.  . metFORMIN (GLUCOPHAGE) 1000 MG tablet TAKE 1 TABLET BY MOUTH TWICE DAILY WITH MEALS  . metoprolol succinate (TOPROL-XL) 25 MG 24 hr tablet Take 1 tablet (25 mg total) by mouth 2 (two) times daily.  . montelukast (SINGULAIR) 10 MG tablet Take 10 mg by mouth at bedtime.  . Multiple Vitamin (MULTIVITAMIN) tablet Take 1 tablet by mouth daily.  . Omega-3 Fatty Acids (FISH OIL) 1200 MG CAPS Take by mouth. 3 daily  . pantoprazole (PROTONIX) 40 MG tablet Take 1 tablet (40 mg total) by mouth daily.  . pioglitazone (ACTOS) 15 MG tablet Take 1 tablet (15 mg total) by mouth daily.  . sertraline (ZOLOFT) 50 MG tablet TAKE 1 TABLET BY MOUTH ONCE DAILY  . tamsulosin (FLOMAX) 0.4 MG CAPS capsule Take 0.4 mg by mouth daily with breakfast.   . testosterone cypionate (DEPOTESTOTERONE CYPIONATE) 200 MG/ML injection Inject 200 mg into the muscle every 14 (fourteen) days.  . traZODone (DESYREL) 50 MG tablet TAKE 1 and 1/2 TABLETS BY MOUTH AT BEDTIME  . vitamin B-12 (CYANOCOBALAMIN) 100 MCG tablet Take 100 mcg by mouth daily.  . [DISCONTINUED] ALPRAZolam (XANAX) 0.25 MG tablet TAKE 1 TABLET BY MOUTH ONCE DAILY AS NEEDED   No facility-administered encounter medications on file as of 02/24/2019.    Review of Systems  Constitutional: Negative for appetite change and unexpected weight change.  HENT: Negative for congestion and sinus pressure.   Eyes: Negative for pain and visual disturbance.  Respiratory: Negative for cough, chest tightness and shortness of breath.   Cardiovascular: Negative for chest pain, palpitations  and leg swelling.  Gastrointestinal: Negative for abdominal pain, diarrhea, nausea and vomiting.  Genitourinary: Negative for difficulty urinating and dysuria.  Musculoskeletal: Negative for joint swelling and myalgias.  Skin: Negative for color change and rash.  Neurological: Negative for dizziness, light-headedness and headaches.  Hematological:  Negative for adenopathy. Does not bruise/bleed easily.  Psychiatric/Behavioral: Negative for agitation and dysphoric mood.       Objective:    Physical Exam Constitutional:      General: He is not in acute distress.    Appearance: Normal appearance. He is well-developed.  HENT:     Head: Normocephalic and atraumatic.     Right Ear: External ear normal.     Left Ear: External ear normal.     Mouth/Throat:     Pharynx: No oropharyngeal exudate.  Eyes:     General: No scleral icterus.       Right eye: No discharge.        Left eye: No discharge.     Conjunctiva/sclera: Conjunctivae normal.  Neck:     Musculoskeletal: Neck supple. No muscular tenderness.     Thyroid: No thyromegaly.  Cardiovascular:     Rate and Rhythm: Normal rate and regular rhythm.  Pulmonary:     Effort: No respiratory distress.     Breath sounds: Normal breath sounds. No wheezing.  Abdominal:     General: Bowel sounds are normal.     Palpations: Abdomen is soft.     Tenderness: There is no abdominal tenderness.  Genitourinary:    Comments: Not performed.  Musculoskeletal:        General: No swelling or tenderness.  Lymphadenopathy:     Cervical: No cervical adenopathy.  Skin:    Findings: No erythema or rash.  Neurological:     Mental Status: He is alert and oriented to person, place, and time.  Psychiatric:        Mood and Affect: Mood normal.        Behavior: Behavior normal.     BP 130/72   Pulse 72   Temp (!) 97.3 F (36.3 C)   Resp 16   Ht _0  (1.753 m)   Wt 206 lb (93.4 kg)   SpO2 97%   BMI 30.42 kg/m  Wt Readings from Last 3  Encounters:  02/24/19 206 lb (93.4 kg)  08/09/18 226 lb (102.5 kg)  06/16/18 226 lb 12.8 oz (102.9 kg)     Lab Results  Component Value Date   WBC 7.5 03/31/2018   HGB 14.9 03/31/2018   HCT 44.2 03/31/2018   PLT 247.0 03/31/2018   GLUCOSE 130 (H) 02/22/2019   CHOL 219 (H) 02/22/2019   TRIG 292.0 (H) 02/22/2019   HDL 34.80 (L) 02/22/2019   LDLDIRECT 158.0 02/22/2019   LDLCALC 58 11/21/2013   ALT 16 02/22/2019   AST 16 02/22/2019   NA 140 02/22/2019   K 4.1 02/22/2019   CL 97 02/22/2019   CREATININE 0.94 02/22/2019   BUN 18 02/22/2019   CO2 31 02/22/2019   TSH 0.72 02/22/2019   PSA 0.4 08/11/2014   HGBA1C 6.5 02/22/2019   MICROALBUR 2.0 (H) 08/06/2018       Assessment & Plan:   Problem List Items Addressed This Visit    Anemia, iron deficiency    Follow cbc.       Relevant Orders   CBC with Differential/Platelet   Anxiety    On zoloft.  Doing well.  Takes xanax prn.  Follow.        Diabetes mellitus ([place])    On trulicity.  Has adjusted his diet.  Lost weight.  Sugars doing well.  a1c 6.5.  Follow met b and a1c.       Relevant Orders   Hemoglobin A1c  Basic metabolic panel   GERD (gastroesophageal reflux disease)    Controlled on 34m protonix.  Tried to decrease dose.  Had symptoms. No symptoms on current dose.  Follow.        Health care maintenance    Physical today 02/24/19.  Followed by urology for prostate checks.  Colonoscopy 07/20/17 - tubular adenomatous polyp.        Hypercholesterolemia    Has been on lipitor.  Recently was accidentally off.  Cholesterol increased.  Has restarted.  Follow lipid panel and liver function tests.        Relevant Orders   Hepatic function panel   Lipid panel   Hypertension    Blood pressure under good control.  Continue same medication regimen.  Follow pressures.  Follow metabolic panel.        Obstructive sleep apnea    Continue cpap.        Other Visit Diagnoses    Prostate cancer screening    -   Primary   Relevant Orders   PSA, Medicare       CEinar Pheasant MD

## 2019-02-27 ENCOUNTER — Encounter: Payer: Self-pay | Admitting: Internal Medicine

## 2019-02-27 NOTE — Assessment & Plan Note (Signed)
On zoloft.  Doing well.  Takes xanax prn.  Follow.

## 2019-02-27 NOTE — Assessment & Plan Note (Signed)
Has been on lipitor.  Recently was accidentally off.  Cholesterol increased.  Has restarted.  Follow lipid panel and liver function tests.

## 2019-02-27 NOTE — Assessment & Plan Note (Signed)
Follow cbc.  

## 2019-02-27 NOTE — Assessment & Plan Note (Signed)
Blood pressure under good control.  Continue same medication regimen.  Follow pressures.  Follow metabolic panel.   

## 2019-02-27 NOTE — Assessment & Plan Note (Signed)
Physical today 02/24/19.  Followed by urology for prostate checks.  Colonoscopy 07/20/17 - tubular adenomatous polyp.

## 2019-02-27 NOTE — Assessment & Plan Note (Signed)
Controlled on 40mg  protonix.  Tried to decrease dose.  Had symptoms. No symptoms on current dose.  Follow.

## 2019-02-27 NOTE — Assessment & Plan Note (Signed)
On trulicity.  Has adjusted his diet.  Lost weight.  Sugars doing well.  a1c 6.5.  Follow met b and a1c.

## 2019-02-27 NOTE — Assessment & Plan Note (Signed)
Continue cpap.  

## 2019-03-07 ENCOUNTER — Other Ambulatory Visit: Payer: Self-pay | Admitting: Internal Medicine

## 2019-03-12 ENCOUNTER — Other Ambulatory Visit: Payer: Self-pay | Admitting: Internal Medicine

## 2019-03-31 LAB — HM DIABETES EYE EXAM

## 2019-04-23 ENCOUNTER — Other Ambulatory Visit: Payer: Self-pay | Admitting: Internal Medicine

## 2019-04-26 ENCOUNTER — Other Ambulatory Visit: Payer: Self-pay | Admitting: Internal Medicine

## 2019-05-09 ENCOUNTER — Other Ambulatory Visit: Payer: Self-pay | Admitting: Internal Medicine

## 2019-05-23 DIAGNOSIS — N400 Enlarged prostate without lower urinary tract symptoms: Secondary | ICD-10-CM | POA: Diagnosis not present

## 2019-05-23 DIAGNOSIS — N529 Male erectile dysfunction, unspecified: Secondary | ICD-10-CM | POA: Diagnosis not present

## 2019-05-23 DIAGNOSIS — E291 Testicular hypofunction: Secondary | ICD-10-CM | POA: Diagnosis not present

## 2019-06-02 ENCOUNTER — Encounter: Payer: Self-pay | Admitting: Internal Medicine

## 2019-06-03 ENCOUNTER — Other Ambulatory Visit: Payer: Self-pay | Admitting: Internal Medicine

## 2019-06-06 ENCOUNTER — Other Ambulatory Visit: Payer: Self-pay

## 2019-06-09 DIAGNOSIS — J0140 Acute pansinusitis, unspecified: Secondary | ICD-10-CM | POA: Diagnosis not present

## 2019-06-10 ENCOUNTER — Other Ambulatory Visit: Payer: Self-pay

## 2019-06-10 ENCOUNTER — Other Ambulatory Visit (INDEPENDENT_AMBULATORY_CARE_PROVIDER_SITE_OTHER): Payer: Medicare PPO

## 2019-06-10 DIAGNOSIS — E7800 Pure hypercholesterolemia, unspecified: Secondary | ICD-10-CM | POA: Diagnosis not present

## 2019-06-10 DIAGNOSIS — D509 Iron deficiency anemia, unspecified: Secondary | ICD-10-CM | POA: Diagnosis not present

## 2019-06-10 DIAGNOSIS — E1159 Type 2 diabetes mellitus with other circulatory complications: Secondary | ICD-10-CM | POA: Diagnosis not present

## 2019-06-10 DIAGNOSIS — Z125 Encounter for screening for malignant neoplasm of prostate: Secondary | ICD-10-CM

## 2019-06-10 LAB — BASIC METABOLIC PANEL
BUN: 18 mg/dL (ref 6–23)
CO2: 31 mEq/L (ref 19–32)
Calcium: 10 mg/dL (ref 8.4–10.5)
Chloride: 100 mEq/L (ref 96–112)
Creatinine, Ser: 0.86 mg/dL (ref 0.40–1.50)
GFR: 86.69 mL/min (ref >60.00)
Glucose, Bld: 130 mg/dL — ABNORMAL HIGH (ref 70–99)
Potassium: 4.3 mEq/L (ref 3.5–5.1)
Sodium: 139 mEq/L (ref 135–145)

## 2019-06-10 LAB — HEPATIC FUNCTION PANEL
ALT: 20 U/L (ref 0–53)
AST: 20 U/L (ref 0–37)
Albumin: 4.1 g/dL (ref 3.5–5.2)
Alkaline Phosphatase: 47 U/L (ref 39–117)
Bilirubin, Direct: 0.2 mg/dL (ref 0.0–0.3)
Total Bilirubin: 0.7 mg/dL (ref 0.2–1.2)
Total Protein: 6.6 g/dL (ref 6.0–8.3)

## 2019-06-10 LAB — CBC WITH DIFFERENTIAL/PLATELET
Basophils Absolute: 0 10*3/uL (ref 0.0–0.1)
Basophils Relative: 0.5 % (ref 0.0–3.0)
Eosinophils Absolute: 0.1 10*3/uL (ref 0.0–0.7)
Eosinophils Relative: 0.8 % (ref 0.0–5.0)
HCT: 43.8 % (ref 39.0–52.0)
Hemoglobin: 14.7 g/dL (ref 13.0–17.0)
Lymphocytes Relative: 22.2 % (ref 12.0–46.0)
Lymphs Abs: 1.9 10*3/uL (ref 0.7–4.0)
MCHC: 33.7 g/dL (ref 30.0–36.0)
MCV: 88.3 fl (ref 78.0–100.0)
Monocytes Absolute: 0.4 10*3/uL (ref 0.1–1.0)
Monocytes Relative: 4.5 % (ref 3.0–12.0)
Neutro Abs: 6.2 10*3/uL (ref 1.4–7.7)
Neutrophils Relative %: 72 % (ref 43.0–77.0)
Platelets: 267 10*3/uL (ref 150.0–400.0)
RBC: 4.95 Mil/uL (ref 4.22–5.81)
RDW: 14.5 % (ref 11.5–15.5)
WBC: 8.5 10*3/uL (ref 4.0–10.5)

## 2019-06-10 LAB — HEMOGLOBIN A1C: Hgb A1c MFr Bld: 6.5 % (ref 4.6–6.5)

## 2019-06-10 LAB — LIPID PANEL
Cholesterol: 140 mg/dL (ref 0–200)
HDL: 32.4 mg/dL — ABNORMAL LOW (ref >39.00)
NonHDL: 107.13
Total CHOL/HDL Ratio: 4
Triglycerides: 227 mg/dL — ABNORMAL HIGH (ref 0.0–149.0)
VLDL: 45.4 mg/dL — ABNORMAL HIGH (ref 0.0–40.0)

## 2019-06-10 LAB — LDL CHOLESTEROL, DIRECT: Direct LDL: 83 mg/dL

## 2019-06-10 LAB — PSA, MEDICARE: PSA: 0.82 ng/ml (ref 0.10–4.00)

## 2019-06-11 ENCOUNTER — Encounter: Payer: Self-pay | Admitting: Internal Medicine

## 2019-06-13 ENCOUNTER — Other Ambulatory Visit: Payer: Medicare PPO

## 2019-06-16 ENCOUNTER — Other Ambulatory Visit: Payer: Medicare Other

## 2019-06-20 ENCOUNTER — Ambulatory Visit (INDEPENDENT_AMBULATORY_CARE_PROVIDER_SITE_OTHER): Payer: Medicare PPO | Admitting: Internal Medicine

## 2019-06-20 ENCOUNTER — Other Ambulatory Visit: Payer: Self-pay

## 2019-06-20 ENCOUNTER — Ambulatory Visit (INDEPENDENT_AMBULATORY_CARE_PROVIDER_SITE_OTHER): Payer: Medicare PPO

## 2019-06-20 VITALS — Ht 69.0 in | Wt 198.0 lb

## 2019-06-20 VITALS — BP 140/68 | Wt 198.0 lb

## 2019-06-20 DIAGNOSIS — D509 Iron deficiency anemia, unspecified: Secondary | ICD-10-CM | POA: Diagnosis not present

## 2019-06-20 DIAGNOSIS — M545 Low back pain, unspecified: Secondary | ICD-10-CM

## 2019-06-20 DIAGNOSIS — K219 Gastro-esophageal reflux disease without esophagitis: Secondary | ICD-10-CM

## 2019-06-20 DIAGNOSIS — F419 Anxiety disorder, unspecified: Secondary | ICD-10-CM

## 2019-06-20 DIAGNOSIS — G4733 Obstructive sleep apnea (adult) (pediatric): Secondary | ICD-10-CM | POA: Diagnosis not present

## 2019-06-20 DIAGNOSIS — I10 Essential (primary) hypertension: Secondary | ICD-10-CM | POA: Diagnosis not present

## 2019-06-20 DIAGNOSIS — E7800 Pure hypercholesterolemia, unspecified: Secondary | ICD-10-CM | POA: Diagnosis not present

## 2019-06-20 DIAGNOSIS — E1159 Type 2 diabetes mellitus with other circulatory complications: Secondary | ICD-10-CM

## 2019-06-20 DIAGNOSIS — Z0000 Encounter for general adult medical examination without abnormal findings: Secondary | ICD-10-CM

## 2019-06-20 DIAGNOSIS — J329 Chronic sinusitis, unspecified: Secondary | ICD-10-CM | POA: Diagnosis not present

## 2019-06-20 NOTE — Progress Notes (Signed)
Patient ID: Ricardo Nash, male   DOB: [birth year], 74 y.o.   MRN: [id number]   Virtual Visit via video Note  This visit type was conducted due to national recommendations for restrictions regarding the COVID-19 pandemic (e.g. social distancing).  This format is felt to be most appropriate for this patient at this time.  All issues noted in this document were discussed and addressed.  No physical exam was performed (except for noted visual exam findings with Video Visits).   I connected with Julieta Gutting by a video enabled telemedicine application and verified that I am speaking with the correct person using two identifiers. Location patient: home Location provider: work  Persons participating in the virtual visit: patient, provider  The limitations, risks, security and privacy concerns of performing an evaluation and management service by video and the availability of in person appointments have been discussed. The patient expressed understanding and agreed to proceed.   Reason for visit: scheduled follow up.    HPI: He reports he is doing relatively well.  Saw Dr Cleda Mccreedy 04/14/19 - foot pain.  Was given prednisone taper and ankle equalizer.  Better.  Also saw Dr Tami Ribas.  Diagnosed with sinus infection.  Treated with levaquin and prednisone.  Better.  No chest congestion, cough or sob.  No abdominal pain or bowel problem reported.  Watching his diet.  Has lost weight.  Over the last month, he aggravated his back.  Wife threw keys and he moved quick to try to catch them. Muscle spasm - back - right side - to thigh.  No radiation further down leg.  No weakness or numbness or tingling.  Discussed labs.  a1c 6.5.  Handling stress.    ROS: See pertinent positives and negatives per HPI.  Past Medical History:  Diagnosis Date  . C. difficile colitis   . Coronary artery disease   . Depression   . Diabetes mellitus ([place])   . Diverticulitis   . Diverticulosis   . Environmental allergies   .  GERD (gastroesophageal reflux disease)   . Goiter    intrathoracic, s/p benign biopsy (Dr Harlow Asa)  . Hypercholesterolemia   . Hypertension   . Osteoarthritis    cervical spine, lumbar spine  . Paroxysmal atrial fibrillation (H[facility])   . PONV (postoperative nausea and vomiting)   . Sleep apnea   . Stroke ([place])   . TIA (transient ischemic attack)     Past Surgical History:  Procedure Laterality Date  . BACK SURGERY  8/08   s/p fusion of L4-L5  . capsule endoscopy    . CATARACT EXTRACTION  2011   Dr. Herbert Deaner  . [place] SURGERY  2002  . COLONOSCOPY WITH PROPOFOL N/A 07/20/2017   Procedure: COLONOSCOPY WITH PROPOFOL;  Surgeon: Manya Silvas, MD;  Location: [facility] ENDOSCOPY;  Service: Endoscopy;  Laterality: N/A;  . EYE SURGERY     cataract bilateral 10/1999  . eyelid reduction     Dr. Carlis Abbott  . INGUINAL HERNIA REPAIR  1991   Dr, Tamala Julian  . NOSE SURGERY     turbinate reduction  . SEPTOPLASTY  1975  . SHOULDER SURGERY  2000   rotator cuff    Family History  Problem Relation Age of Onset  . Congestive Heart Failure Father   . Heart disease Father        myocardial infarction  . Rheumatic fever Father        valvular disease  . Heart disease Mother  s/p CABG (age 24)  . Kidney disease Sister   . Colon cancer Neg Hx   . Prostate cancer Neg Hx     SOCIAL HX: reviewed.    Current Outpatient Medications:  .  ACCU-CHEK AVIVA PLUS test strip, TEST SUGAR TWICE DAILY, Disp: 100 each, Rfl: 3 .  ALPRAZolam (XANAX) 0.25 MG tablet, Take 1 tablet (0.25 mg total) by mouth daily as needed., Disp: 30 tablet, Rfl: 0 .  amLODipine (NORVASC) 10 MG tablet, TAKE 1 TABLET BY MOUTH ONCE DAILY, Disp: 90 tablet, Rfl: 2 .  Ascorbic Acid (VITAMIN C) 100 MG tablet, Take 100 mg by mouth daily., Disp: , Rfl:  .  aspirin 81 MG tablet, Take 81 mg by mouth daily., Disp: , Rfl:  .  atorvastatin (LIPITOR) 10 MG tablet, TAKE 1 TABLET BY MOUTH ONCE DAILY, Disp: 90 tablet, Rfl: 3 .   Azelastine-Fluticasone (DYMISTA NA), Place 2 puffs into the nose daily., Disp: , Rfl:  .  clopidogrel (PLAVIX) 75 MG tablet, TAKE 1 TABLET BY MOUTH ONCE DAILY, Disp: 90 tablet, Rfl: 1 .  Dulaglutide (TRULICITY) 7.34 LP/3.7TK SOPN, Inject 0.75 mg into the skin once a week., Disp: 12 pen, Rfl: 1 .  hydrochlorothiazide (HYDRODIURIL) 50 MG tablet, Take 1 tablet (50 mg total) by mouth daily., Disp: 90 tablet, Rfl: 1 .  levocetirizine (XYZAL) 5 MG tablet, Take 5 mg by mouth every evening., Disp: , Rfl:  .  lisinopril (ZESTRIL) 40 MG tablet, Take 1 tablet (40 mg total) by mouth daily., Disp: 90 tablet, Rfl: 3 .  metFORMIN (GLUCOPHAGE) 1000 MG tablet, TAKE 1 TABLET BY MOUTH TWICE DAILY WITH MEALS, Disp: 180 tablet, Rfl: 11 .  metoprolol succinate (TOPROL-XL) 25 MG 24 hr tablet, Take 1 tablet (25 mg total) by mouth 2 (two) times daily., Disp: 180 tablet, Rfl: 3 .  montelukast (SINGULAIR) 10 MG tablet, Take 10 mg by mouth at bedtime., Disp: , Rfl:  .  Multiple Vitamin (MULTIVITAMIN) tablet, Take 1 tablet by mouth daily., Disp: , Rfl:  .  Omega-3 Fatty Acids (FISH OIL) 1200 MG CAPS, Take by mouth. 3 daily, Disp: , Rfl:  .  pantoprazole (PROTONIX) 40 MG tablet, TAKE 1 TABLET BY MOUTH ONCE DAILY, Disp: 30 tablet, Rfl: 2 .  pioglitazone (ACTOS) 15 MG tablet, TAKE 1 TABLET BY MOUTH ONCE DAILY, Disp: 90 tablet, Rfl: 1 .  sertraline (ZOLOFT) 50 MG tablet, TAKE 1 TABLET BY MOUTH ONCE DAILY, Disp: 90 tablet, Rfl: 1 .  tamsulosin (FLOMAX) 0.4 MG CAPS capsule, Take 0.4 mg by mouth daily with breakfast. , Disp: , Rfl:  .  testosterone cypionate (DEPOTESTOTERONE CYPIONATE) 200 MG/ML injection, Inject 200 mg into the muscle every 14 (fourteen) days., Disp: , Rfl:  .  traZODone (DESYREL) 50 MG tablet, TAKE 1 and 1/2 TABLETS BY MOUTH AT BEDTIME, Disp: 45 tablet, Rfl: 3 .  vitamin B-12 (CYANOCOBALAMIN) 100 MCG tablet, Take 100 mcg by mouth daily., Disp: , Rfl:   EXAM:  GENERAL: alert, oriented, appears well and in no  acute distress  HEENT: atraumatic, conjunttiva clear, no obvious abnormalities on inspection of external nose and ears  NECK: normal movements of the head and neck  LUNGS: on inspection no signs of respiratory distress, breathing rate appears normal, no obvious gross SOB, gasping or wheezing  CV: no obvious cyanosis  PSYCH/NEURO: pleasant and cooperative, no obvious depression or anxiety, speech and thought processing grossly intact  ASSESSMENT AND PLAN:  Discussed the following assessment and plan:  Anemia, iron  deficiency Follow cbc.   Anxiety On zoloft.  Takes xanax prn.  Stable.  Follow.    Back pain Back pain as outlined.  Aggravated recently.  Refer to PT.    Diabetes mellitus On trulicity and metformin.  Low carb diet and exercise.  a1c 6.5.  Follow met b and a1c.   Frequent sinus infections Recently treated by Dr Tami Ribas.  Feels better.  Follow.    GERD (gastroesophageal reflux disease) On protonix.  Controlled.   Hypercholesterolemia Back on lipitor.  Low cholesterol diet and exercise.  Follow lipid panel and liver function tests.    Hypertension Blood pressure as outlined.  Continue current medication regimen.  Follow pressures.  Follow metabolic panel.   Obstructive sleep apnea Continue cpap.    Orders Placed This Encounter  Procedures  . Hemoglobin A1c    Standing Status:   Future    Standing Expiration Date:   06/23/2020  . Hepatic function panel    Standing Status:   Future    Standing Expiration Date:   06/23/2020  . Lipid panel    Standing Status:   Future    Standing Expiration Date:   06/23/2020  . Basic metabolic panel    Standing Status:   Future    Standing Expiration Date:   06/23/2020  . Microalbumin / creatinine urine ratio    Standing Status:   Future    Standing Expiration Date:   06/23/2020  . Ambulatory referral to Physical Therapy    Referral Priority:   Routine    Referral Type:   Physical Medicine    Referral Reason:    Specialty Services Required    Requested Specialty:   Physical Therapy    Number of Visits Requested:   1    Meds ordered this encounter  Medications  . ALPRAZolam (XANAX) 0.25 MG tablet    Sig: Take 1 tablet (0.25 mg total) by mouth daily as needed.    Dispense:  30 tablet    Refill:  0  . hydrochlorothiazide (HYDRODIURIL) 50 MG tablet    Sig: Take 1 tablet (50 mg total) by mouth daily.    Dispense:  90 tablet    Refill:  1  . metoprolol succinate (TOPROL-XL) 25 MG 24 hr tablet    Sig: Take 1 tablet (25 mg total) by mouth 2 (two) times daily.    Dispense:  180 tablet    Refill:  3  . lisinopril (ZESTRIL) 40 MG tablet    Sig: Take 1 tablet (40 mg total) by mouth daily.    Dispense:  90 tablet    Refill:  3     I discussed the assessment and treatment plan with the patient. The patient was provided an opportunity to ask questions and all were answered. The patient agreed with the plan and demonstrated an understanding of the instructions.   The patient was advised to call back or seek an in-person evaluation if the symptoms worsen or if the condition fails to improve as anticipated.   Einar Pheasant, MD

## 2019-06-20 NOTE — Progress Notes (Addendum)
Subjective:   Ricardo Nash is a 74 y.o. male who presents for Medicare Annual/Subsequent preventive examination.  Review of Systems:  No ROS.  Medicare Wellness Virtual Visit.  Visual/audio telehealth visit, UTA vital signs.   Ht/Wt provided. See social history for additional risk factors.   Cardiac Risk Factors include: male gender;advanced age (>78men, >98 women);diabetes mellitus;hypertension     Objective:    Vitals: Ht 5\' 9"  (1.753 m)   Wt 198 lb (89.8 kg)   BMI 29.24 kg/m   Body mass index is 29.24 kg/m.  Advanced Directives 06/20/2019 06/16/2018 07/20/2017 06/08/2017 09/08/2016 09/06/2016 06/05/2016  Does Patient Have a Medical Advance Directive? Yes Yes Yes Yes No No Yes  Type of Paramedic of [place];Living will [place];Living will [place];Living will [place];Living will - - [place];Living will  Does patient want to make changes to medical advance directive? No - Patient declined No - Patient declined - No - Patient declined - - No - Patient declined  Copy of [place] in Chart? Yes - validated most recent copy scanned in chart (See row information) Yes - validated most recent copy scanned in chart (See row information) No - copy requested Yes - - No - copy requested  Would patient like information on creating a medical advance directive? - - - - No - Patient declined No - Patient declined -    Tobacco Social History   Tobacco Use  Smoking Status Former Smoker  Smokeless Tobacco Never Used     Counseling given: Not Answered   Clinical Intake:  Pre-visit preparation completed: Yes        Diabetes: Yes(Followed by pcp)  How often do you need to have someone help you when you read instructions, pamphlets, or other written materials from your doctor or pharmacy?: 1 - Never  Interpreter Needed?: No     Past Medical History:    Diagnosis Date  . C. difficile colitis   . Coronary artery disease   . Depression   . Diabetes mellitus ([place])   . Diverticulitis   . Diverticulosis   . Environmental allergies   . GERD (gastroesophageal reflux disease)   . Goiter    intrathoracic, s/p benign biopsy (Dr Harlow Asa)  . Hypercholesterolemia   . Hypertension   . Osteoarthritis    cervical spine, lumbar spine  . Paroxysmal atrial fibrillation (HCC)   . PONV (postoperative nausea and vomiting)   . Sleep apnea   . Stroke ([place])   . TIA (transient ischemic attack)    Past Surgical History:  Procedure Laterality Date  . BACK SURGERY  8/08   s/p fusion of L4-L5  . capsule endoscopy    . CATARACT EXTRACTION  2011   Dr. Herbert Deaner  . [place] SURGERY  2002  . COLONOSCOPY WITH PROPOFOL N/A 07/20/2017   Procedure: COLONOSCOPY WITH PROPOFOL;  Surgeon: Manya Silvas, MD;  Location: [facility] ENDOSCOPY;  Service: Endoscopy;  Laterality: N/A;  . EYE SURGERY     cataract bilateral 10/1999  . eyelid reduction     Dr. Carlis Abbott  . INGUINAL HERNIA REPAIR  1991   Dr, Tamala Julian  . NOSE SURGERY     turbinate reduction  . SEPTOPLASTY  1975  . SHOULDER SURGERY  2000   rotator cuff   Family History  Problem Relation Age of Onset  . Congestive Heart Failure Father   . Heart disease Father  myocardial infarction  . Rheumatic fever Father        valvular disease  . Heart disease Mother        s/p CABG (age 56)  . Kidney disease Sister   . Colon cancer Neg Hx   . Prostate cancer Neg Hx    Social History   Socioeconomic History  . Marital status: Married    Spouse name: Not on file  . Number of children: 2  . Years of education: Not on file  . Highest education level: Not on file  Occupational History  . Occupation: retired Pharmacist, hospital  Tobacco Use  . Smoking status: Former Research scientist (life sciences)  . Smokeless tobacco: Never Used  Substance and Sexual Activity  . Alcohol use: Yes    Alcohol/week: 0.0 standard drinks    Comment: occasional   . Drug use: No  . Sexual activity: Not Currently  Other Topics Concern  . Not on file  Social History Narrative  . Not on file   Social Determinants of Health   Financial Resource Strain:   . Difficulty of Paying Living Expenses: Not on file  Food Insecurity:   . Worried About Charity fundraiser in the Last Year: Not on file  . Ran Out of Food in the Last Year: Not on file  Transportation Needs:   . Lack of Transportation (Medical): Not on file  . Lack of Transportation (Non-Medical): Not on file  Physical Activity:   . Days of Exercise per Week: Not on file  . Minutes of Exercise per Session: Not on file  Stress:   . Feeling of Stress : Not on file  Social Connections:   . Frequency of Communication with Friends and Family: Not on file  . Frequency of Social Gatherings with Friends and Family: Not on file  . Attends Religious Services: Not on file  . Active Member of Clubs or Organizations: Not on file  . Attends Archivist Meetings: Not on file  . Marital Status: Not on file    Outpatient Encounter Medications as of 06/20/2019  Medication Sig  . ACCU-CHEK AVIVA PLUS test strip TEST SUGAR TWICE DAILY  . ALPRAZolam (XANAX) 0.25 MG tablet TAKE 1 TABLET BY MOUTH ONCE DAILY AS NEEDED  . amLODipine (NORVASC) 10 MG tablet TAKE 1 TABLET BY MOUTH ONCE DAILY  . Ascorbic Acid (VITAMIN C) 100 MG tablet Take 100 mg by mouth daily.  Marland Kitchen aspirin 81 MG tablet Take 81 mg by mouth daily.  Marland Kitchen atorvastatin (LIPITOR) 10 MG tablet TAKE 1 TABLET BY MOUTH ONCE DAILY  . Azelastine-Fluticasone (DYMISTA NA) Place 2 puffs into the nose daily.  . clopidogrel (PLAVIX) 75 MG tablet TAKE 1 TABLET BY MOUTH ONCE DAILY  . Dulaglutide (TRULICITY) A999333 0000000 SOPN Inject 0.75 mg into the skin once a week.  . hydrochlorothiazide (HYDRODIURIL) 50 MG tablet TAKE 1 TABLET BY MOUTH ONCE DAILY  . levocetirizine (XYZAL) 5 MG tablet Take 5 mg by mouth every evening.  Marland Kitchen lisinopril (PRINIVIL,ZESTRIL) 40 MG  tablet Take 1 tablet (40 mg total) by mouth daily.  . metFORMIN (GLUCOPHAGE) 1000 MG tablet TAKE 1 TABLET BY MOUTH TWICE DAILY WITH MEALS  . metoprolol succinate (TOPROL-XL) 25 MG 24 hr tablet TAKE 1 TABLET BY MOUTH TWICE DAILY  . montelukast (SINGULAIR) 10 MG tablet Take 10 mg by mouth at bedtime.  . Multiple Vitamin (MULTIVITAMIN) tablet Take 1 tablet by mouth daily.  . Omega-3 Fatty Acids (FISH OIL) 1200 MG CAPS Take by mouth.  3 daily  . pantoprazole (PROTONIX) 40 MG tablet TAKE 1 TABLET BY MOUTH ONCE DAILY  . pioglitazone (ACTOS) 15 MG tablet TAKE 1 TABLET BY MOUTH ONCE DAILY  . sertraline (ZOLOFT) 50 MG tablet TAKE 1 TABLET BY MOUTH ONCE DAILY  . tamsulosin (FLOMAX) 0.4 MG CAPS capsule Take 0.4 mg by mouth daily with breakfast.   . testosterone cypionate (DEPOTESTOTERONE CYPIONATE) 200 MG/ML injection Inject 200 mg into the muscle every 14 (fourteen) days.  . traZODone (DESYREL) 50 MG tablet TAKE 1 and 1/2 TABLETS BY MOUTH AT BEDTIME  . vitamin B-12 (CYANOCOBALAMIN) 100 MCG tablet Take 100 mcg by mouth daily.   No facility-administered encounter medications on file as of 06/20/2019.    Activities of Daily Living In your present state of health, do you have any difficulty performing the following activities: 06/20/2019  Hearing? Y  Comment Hearing aids  Vision? N  Difficulty concentrating or making decisions? N  Walking or climbing stairs? N  Dressing or bathing? N  Doing errands, shopping? N  Preparing Food and eating ? N  Using the Toilet? N  In the past six months, have you accidently leaked urine? N  Do you have problems with loss of bowel control? N  Managing your Medications? N  Managing your Finances? N  Housekeeping or managing your Housekeeping? N  Some recent data might be hidden    Patient Care Team: Einar Pheasant, MD as PCP - General (Internal Medicine)   Assessment:   This is a routine wellness examination for [place].  Nurse connected with patient 06/20/19 at  10:00 AM EST by a telephone enabled telemedicine application and verified that I am speaking with the correct person using two identifiers. Patient stated full name and DOB. Patient gave permission to continue with virtual visit. Patient's location was at home and Nurse's location was at [place] office.   Patient is alert and oriented x3. Patient denies difficulty focusing or concentrating. Patient likes to read and play games for brain stimulation.   Health Maintenance Due: -Foot Exam- followed by pcp.  -Hgb A1c- 06/10/19 (6.5)  See completed HM at the end of note.   Eye: Visual acuity not assessed. Virtual visit. Followed by their ophthalmologist. Retinopathy- none reported  Dental: Visits every 6 months.    Hearing: Hearing aids- yes  Safety:  Patient feels safe at home- yes Patient does have smoke detectors at home- yes Patient does wear sunscreen or protective clothing when in direct sunlight - yes Patient does wear seat belt when in a moving vehicle - yes Patient drives- yes Adequate lighting in walkways free from debris- yes Grab bars and handrails used as appropriate- yes Ambulates with an assistive device- no Cell phone on person when ambulating outside of the home- yes  Social: Alcohol intake - yes      Smoking history- former   Smokers in home? none Illicit drug use? none  Medication: Taking as directed and without issues.  Pill box in use -yes  Self managed - yes   Covid-19: Precautions and sickness symptoms discussed. Wears mask, social distancing, hand hygiene as appropriate.   Activities of Daily Living Patient denies needing assistance with: household chores, feeding themselves, getting from bed to chair, getting to the toilet, bathing/showering, dressing, managing money, or preparing meals.   Discussed the importance of a healthy diet, water intake and the benefits of aerobic exercise.   Physical activity- walking daily 1 mile. Strength training 5  days weekly at the gym.  Diet:  Low carb Water: good intake  Caffeine: 1 cup of coffee  Other Providers Patient Care Team: Einar Pheasant, MD as PCP - General (Internal Medicine)  Exercise Activities and Dietary recommendations Current Exercise Habits: Home exercise routine, Type of exercise: strength training/weights;walking, Time (Minutes): 60, Frequency (Times/Week): 5, Weekly Exercise (Minutes/Week): 300, Intensity: Mild  Goals    . Follow up with Primary Care Provider     As needed       Fall Risk Fall Risk  06/20/2019 06/16/2018 06/08/2017 06/05/2016 09/07/2015  Falls in the past year? 0 0 No Yes No  Number falls in past yr: - - - 2 or more -  Injury with Fall? - - - No -  Follow up Falls evaluation completed - - Falls prevention discussed;Education provided -    Timed Get Up and Go Performed: no, virtual visit  Depression Screen PHQ 2/9 Scores 06/20/2019 06/16/2018 06/08/2017 06/05/2016  PHQ - 2 Score 0 0 1 0  PHQ- 9 Score - - - -    Cognitive Function MMSE - Mini Mental State Exam 06/05/2016  Orientation to time 5  Orientation to Place 5  Registration 3  Attention/ Calculation 5  Recall 3  Language- name 2 objects 2  Language- repeat 1  Language- follow 3 step command 3  Language- read & follow direction 1  Write a sentence 1  Copy design 1  Total score 30     6CIT Screen 06/20/2019 06/16/2018 06/08/2017  What Year? 0 points 0 points 0 points  What month? 0 points 0 points 0 points  What time? 0 points 0 points 0 points  Count back from 20 - 0 points 0 points  Months in reverse 0 points 0 points 0 points  Repeat phrase - 0 points 0 points  Total Score - 0 0    Immunization History  Administered Date(s) Administered  . Influenza Split 02/16/2012, 02/13/2014  . Influenza, High Dose Seasonal PF 02/20/2016, 02/05/2017, 02/09/2018, 02/07/2019  . Influenza,inj,Quad PF,6+ Mos 01/14/2013, 03/08/2015  . Pneumococcal Conjugate-13 03/10/2013  . Pneumococcal  Polysaccharide-23 08/23/2014  . Tdap 05/13/2011  . Zoster 12/08/2013  . Zoster Recombinat (Shingrix) 04/24/2017, 07/15/2017   Screening Tests Health Maintenance  Topic Date Due  . FOOT EXAM  10/18/2019 (Originally 12/16/2018)  . HEMOGLOBIN A1C  12/08/2019  . OPHTHALMOLOGY EXAM  03/30/2020  . TETANUS/TDAP  05/12/2021  . COLONOSCOPY  07/21/2027  . INFLUENZA VACCINE  Completed  . Hepatitis C Screening  Completed  . PNA vac Low Risk Adult  Completed      Plan:   Keep all routine maintenance appointments.   Follow up with your doctor today at 10:30  Medicare Attestation I have personally reviewed: The patient's medical and social history Their use of alcohol, tobacco or illicit drugs Their current medications and supplements The patient's functional ability including ADLs,fall risks, home safety risks, cognitive, and hearing and visual impairment Diet and physical activities Evidence for depression   I have reviewed and discussed with patient certain preventive protocols, quality metrics, and best practice recommendations.     Varney Biles, LPN  [id number]   Reviewed above information.  Agree with assessment and plan.    Dr Nicki Reaper

## 2019-06-20 NOTE — Patient Instructions (Addendum)
  Ricardo Nash , Thank you for taking time to come for your Medicare Wellness Visit. I appreciate your ongoing commitment to your health goals. Please review the following plan we discussed and let me know if I can assist you in the future.   These are the goals we discussed: Goals    . Follow up with Primary Care Provider     As needed       This is a list of the screening recommended for you and due dates:  Health Maintenance  Topic Date Due  . Complete foot exam   10/18/2019*  . Hemoglobin A1C  12/08/2019  . Eye exam for diabetics  03/30/2020  . Tetanus Vaccine  05/12/2021  . Colon Cancer Screening  07/21/2027  . Flu Shot  Completed  .  Hepatitis C: One time screening is recommended by Center for Disease Control  (CDC) for  adults born from [birth year] through 1966.   Completed  . Pneumonia vaccines  Completed  *Topic was postponed. The date shown is not the original due date.

## 2019-06-21 ENCOUNTER — Telehealth: Payer: Self-pay | Admitting: Internal Medicine

## 2019-06-21 NOTE — Telephone Encounter (Signed)
Saw pt yesterday. Requested referral for PT at [facility] physical therapy- [place]. He has already made his appt for friday

## 2019-06-21 NOTE — Telephone Encounter (Signed)
Pt was calling to check on a referral for physical therapy

## 2019-06-22 NOTE — Telephone Encounter (Signed)
Pt with low back pain.  Requested referral to physical therapy - [facility].  He called and has appt for Friday.  Can we make sure referral in place.  Thanks.

## 2019-06-22 NOTE — Telephone Encounter (Signed)
Thank you :)

## 2019-06-22 NOTE — Telephone Encounter (Signed)
Faxed order to [facility] in [place].   [employer]

## 2019-06-24 ENCOUNTER — Encounter: Payer: Self-pay | Admitting: Internal Medicine

## 2019-06-24 DIAGNOSIS — M545 Low back pain: Secondary | ICD-10-CM | POA: Diagnosis not present

## 2019-06-24 MED ORDER — ALPRAZOLAM 0.25 MG PO TABS: 0.2500 mg | ORAL_TABLET | Freq: Every day | ORAL | 0 refills | Status: DC | PRN

## 2019-06-24 MED ORDER — METOPROLOL SUCCINATE ER 25 MG PO TB24: 25.0000 mg | ORAL_TABLET | Freq: Two times a day (BID) | ORAL | 3 refills | Status: DC

## 2019-06-24 MED ORDER — LISINOPRIL 40 MG PO TABS: 40.0000 mg | ORAL_TABLET | Freq: Every day | ORAL | 3 refills | Status: DC

## 2019-06-24 MED ORDER — HYDROCHLOROTHIAZIDE 50 MG PO TABS: 50.0000 mg | ORAL_TABLET | Freq: Every day | ORAL | 1 refills | Status: DC

## 2019-06-24 NOTE — Assessment & Plan Note (Signed)
Follow cbc.  

## 2019-06-24 NOTE — Assessment & Plan Note (Signed)
Recently treated by Dr Tami Ribas.  Feels better.  Follow.

## 2019-06-24 NOTE — Assessment & Plan Note (Signed)
Back pain as outlined.  Aggravated recently.  Refer to PT.

## 2019-06-24 NOTE — Assessment & Plan Note (Signed)
On zoloft.  Takes xanax prn.  Stable.  Follow.

## 2019-06-24 NOTE — Assessment & Plan Note (Signed)
On protonix.  Controlled.   

## 2019-06-24 NOTE — Assessment & Plan Note (Signed)
Continue cpap.  

## 2019-06-24 NOTE — Assessment & Plan Note (Signed)
On trulicity and metformin.  Low carb diet and exercise.  a1c 6.5.  Follow met b and a1c.

## 2019-06-24 NOTE — Assessment & Plan Note (Signed)
Blood pressure as outlined.  Continue current medication regimen.  Follow pressures.  Follow metabolic panel.  

## 2019-06-24 NOTE — Assessment & Plan Note (Signed)
Back on lipitor.  Low cholesterol diet and exercise.  Follow lipid panel and liver function tests.

## 2019-06-29 DIAGNOSIS — M545 Low back pain: Secondary | ICD-10-CM | POA: Diagnosis not present

## 2019-07-01 DIAGNOSIS — M545 Low back pain: Secondary | ICD-10-CM | POA: Diagnosis not present

## 2019-07-01 DIAGNOSIS — M25562 Pain in left knee: Secondary | ICD-10-CM | POA: Diagnosis not present

## 2019-07-01 DIAGNOSIS — M1712 Unilateral primary osteoarthritis, left knee: Secondary | ICD-10-CM | POA: Diagnosis not present

## 2019-07-05 DIAGNOSIS — M545 Low back pain: Secondary | ICD-10-CM | POA: Diagnosis not present

## 2019-07-06 DIAGNOSIS — M25562 Pain in left knee: Secondary | ICD-10-CM | POA: Diagnosis not present

## 2019-07-07 DIAGNOSIS — M545 Low back pain: Secondary | ICD-10-CM | POA: Diagnosis not present

## 2019-07-08 DIAGNOSIS — M109 Gout, unspecified: Secondary | ICD-10-CM | POA: Diagnosis not present

## 2019-07-08 DIAGNOSIS — G4733 Obstructive sleep apnea (adult) (pediatric): Secondary | ICD-10-CM | POA: Diagnosis not present

## 2019-07-08 DIAGNOSIS — R55 Syncope and collapse: Secondary | ICD-10-CM | POA: Diagnosis not present

## 2019-07-08 DIAGNOSIS — N529 Male erectile dysfunction, unspecified: Secondary | ICD-10-CM | POA: Diagnosis not present

## 2019-07-08 DIAGNOSIS — I10 Essential (primary) hypertension: Secondary | ICD-10-CM | POA: Diagnosis not present

## 2019-07-08 DIAGNOSIS — I480 Paroxysmal atrial fibrillation: Secondary | ICD-10-CM | POA: Diagnosis not present

## 2019-07-10 ENCOUNTER — Encounter: Payer: Self-pay | Admitting: Internal Medicine

## 2019-07-12 DIAGNOSIS — M545 Low back pain: Secondary | ICD-10-CM | POA: Diagnosis not present

## 2019-07-12 NOTE — Telephone Encounter (Signed)
Correction: pt would like to know if you are ok with stopping aspirin, not plavix. Please disregard other message about this.

## 2019-07-13 DIAGNOSIS — M25562 Pain in left knee: Secondary | ICD-10-CM | POA: Diagnosis not present

## 2019-07-13 NOTE — Telephone Encounter (Signed)
Pt is wanting to stop plavix and continue aspirin. Clarified with pt.

## 2019-07-13 NOTE — Telephone Encounter (Signed)
It appears in reviewing cardiology's note, they mentioned stopping plavix and continuing aspirin.  There have been recent studies that have shown that treatment with aspirin and plavix compared to aspirin alone did not reduce risk of recurrent stroke.  Also, study comparing plavix and asprin compared to plavix alone did not reduce risk of major vascular events.  Please just clarify which he is stopping.  See me before calling pt.

## 2019-07-19 DIAGNOSIS — M545 Low back pain: Secondary | ICD-10-CM | POA: Diagnosis not present

## 2019-07-22 DIAGNOSIS — M545 Low back pain: Secondary | ICD-10-CM | POA: Diagnosis not present

## 2019-07-24 ENCOUNTER — Encounter: Payer: Self-pay | Admitting: Internal Medicine

## 2019-07-26 DIAGNOSIS — M545 Low back pain: Secondary | ICD-10-CM | POA: Diagnosis not present

## 2019-07-26 NOTE — Telephone Encounter (Signed)
Confirmed patient ok. Scheduled him for visit to see Dr Nicki Reaper to discuss frequent falls.

## 2019-07-26 NOTE — Telephone Encounter (Signed)
Left message to call back. Need to get more info to determine need for appt or if ok to order PT for his knee.

## 2019-07-26 NOTE — Telephone Encounter (Signed)
Pt called back transferred to [place]

## 2019-07-28 DIAGNOSIS — M545 Low back pain: Secondary | ICD-10-CM | POA: Diagnosis not present

## 2019-07-28 DIAGNOSIS — G4733 Obstructive sleep apnea (adult) (pediatric): Secondary | ICD-10-CM | POA: Diagnosis not present

## 2019-07-29 ENCOUNTER — Other Ambulatory Visit: Payer: Self-pay

## 2019-07-29 ENCOUNTER — Telehealth: Payer: Self-pay | Admitting: Internal Medicine

## 2019-07-29 ENCOUNTER — Ambulatory Visit: Payer: Medicare PPO | Admitting: Internal Medicine

## 2019-07-29 ENCOUNTER — Encounter: Payer: Self-pay | Admitting: Internal Medicine

## 2019-07-29 DIAGNOSIS — M25562 Pain in left knee: Secondary | ICD-10-CM

## 2019-07-29 DIAGNOSIS — E1159 Type 2 diabetes mellitus with other circulatory complications: Secondary | ICD-10-CM | POA: Diagnosis not present

## 2019-07-29 DIAGNOSIS — M545 Low back pain, unspecified: Secondary | ICD-10-CM

## 2019-07-29 DIAGNOSIS — I10 Essential (primary) hypertension: Secondary | ICD-10-CM | POA: Diagnosis not present

## 2019-07-29 NOTE — Progress Notes (Signed)
Patient ID: Ricardo Nash, male   DOB: 1945-11-26, 74 y.o.   MRN: [id number]   Subjective:    Patient ID: Ricardo Nash, male    DOB: [birth year], 74 y.o.   MRN: [id number]  HPI This visit occurred during the SARS-CoV-2 public health emergency.  Safety protocols were in place, including screening questions prior to the visit, additional usage of staff PPE, and extensive cleaning of exam room while observing appropriate contact time as indicated for disinfecting solutions.  Patient here as a workin appt regarding persistent left knee pain. Has been seen by ortho.  Was initially diagnosed with bursitis.  Was given ultram.  F/u with ortho, diagnosed with arthritis.  Given steroid injection.  Also concerned regarding possible gout.  Was given colchicine.  Fluid aspiration - negative.  Was referred to physical therapy.   States still having knee pain.  Concerned regarding persistent pain and has fallen a few times in the last few weeks.  First episode occurred when he bent over to get the paper.  Second fall, left knee buckled.  Third fall, ben over.  Hard for him to climb stairs.  Legs feel weak.  Knee is stiff.  Wearing brace. Has previously exercised regularly.  Knee/legs - affecting his activity.  No chest pain or sob reported.  Sugars ok.    Past Medical History:  Diagnosis Date  . C. difficile colitis   . Coronary artery disease   . Depression   . Diabetes mellitus ([place])   . Diverticulitis   . Diverticulosis   . Environmental allergies   . GERD (gastroesophageal reflux disease)   . Goiter    intrathoracic, s/p benign biopsy (Dr Harlow Asa)  . Hypercholesterolemia   . Hypertension   . Osteoarthritis    cervical spine, lumbar spine  . Paroxysmal atrial fibrillation ([facility])   . PONV (postoperative nausea and vomiting)   . Sleep apnea   . Stroke ([place])   . TIA (transient ischemic attack)    Past Surgical History:  Procedure Laterality Date  . BACK SURGERY  8/08   s/p fusion of L4-L5   . capsule endoscopy    . CATARACT EXTRACTION  2011   Dr. Herbert Deaner  . [place] SURGERY  2002  . COLONOSCOPY WITH PROPOFOL N/A 07/20/2017   Procedure: COLONOSCOPY WITH PROPOFOL;  Surgeon: Manya Silvas, MD;  Location: [facility] ENDOSCOPY;  Service: Endoscopy;  Laterality: N/A;  . EYE SURGERY     cataract bilateral 10/1999  . eyelid reduction     Dr. Carlis Abbott  . INGUINAL HERNIA REPAIR  1991   Dr, Tamala Julian  . NOSE SURGERY     turbinate reduction  . SEPTOPLASTY  1975  . SHOULDER SURGERY  2000   rotator cuff   Family History  Problem Relation Age of Onset  . Congestive Heart Failure Father   . Heart disease Father        myocardial infarction  . Rheumatic fever Father        valvular disease  . Heart disease Mother        s/p CABG (age 47)  . Kidney disease Sister   . Colon cancer Neg Hx   . Prostate cancer Neg Hx    Social History   Socioeconomic History  . Marital status: Married    Spouse name: Not on file  . Number of children: 2  . Years of education: Not on file  . Highest education level: Not on file  Occupational History  .  Occupation: retired Pharmacist, hospital  Tobacco Use  . Smoking status: Former Research scientist (life sciences)  . Smokeless tobacco: Never Used  Substance and Sexual Activity  . Alcohol use: Yes    Alcohol/week: 0.0 standard drinks    Comment: occasional  . Drug use: No  . Sexual activity: Not Currently  Other Topics Concern  . Not on file  Social History Narrative  . Not on file   Social Determinants of Health   Financial Resource Strain:   . Difficulty of Paying Living Expenses:   Food Insecurity:   . Worried About Charity fundraiser in the Last Year:   . Arboriculturist in the Last Year:   Transportation Needs:   . Film/video editor (Medical):   Marland Kitchen Lack of Transportation (Non-Medical):   Physical Activity:   . Days of Exercise per Week:   . Minutes of Exercise per Session:   Stress:   . Feeling of Stress :   Social Connections:   . Frequency of  Communication with Friends and Family:   . Frequency of Social Gatherings with Friends and Family:   . Attends Religious Services:   . Active Member of Clubs or Organizations:   . Attends Archivist Meetings:   Marland Kitchen Marital Status:     Outpatient Encounter Medications as of 07/29/2019  Medication Sig  . ACCU-CHEK AVIVA PLUS test strip TEST SUGAR TWICE DAILY  . ALPRAZolam (XANAX) 0.25 MG tablet Take 1 tablet (0.25 mg total) by mouth daily as needed.  Marland Kitchen amLODipine (NORVASC) 10 MG tablet TAKE 1 TABLET BY MOUTH ONCE DAILY  . Ascorbic Acid (VITAMIN C) 100 MG tablet Take 100 mg by mouth daily.  Marland Kitchen aspirin 81 MG tablet Take 81 mg by mouth daily.  Marland Kitchen atorvastatin (LIPITOR) 10 MG tablet TAKE 1 TABLET BY MOUTH ONCE DAILY  . Azelastine-Fluticasone (DYMISTA NA) Place 2 puffs into the nose daily.  . clopidogrel (PLAVIX) 75 MG tablet TAKE 1 TABLET BY MOUTH ONCE DAILY  . Dulaglutide (TRULICITY) 3.29 JM/4.2AS SOPN Inject 0.75 mg into the skin once a week.  . hydrochlorothiazide (HYDRODIURIL) 50 MG tablet Take 1 tablet (50 mg total) by mouth daily.  Marland Kitchen levocetirizine (XYZAL) 5 MG tablet Take 5 mg by mouth every evening.  Marland Kitchen lisinopril (ZESTRIL) 40 MG tablet Take 1 tablet (40 mg total) by mouth daily.  . metFORMIN (GLUCOPHAGE) 1000 MG tablet TAKE 1 TABLET BY MOUTH TWICE DAILY WITH MEALS  . metoprolol succinate (TOPROL-XL) 25 MG 24 hr tablet Take 1 tablet (25 mg total) by mouth 2 (two) times daily.  . montelukast (SINGULAIR) 10 MG tablet Take 10 mg by mouth at bedtime.  . Multiple Vitamin (MULTIVITAMIN) tablet Take 1 tablet by mouth daily.  . Omega-3 Fatty Acids (FISH OIL) 1200 MG CAPS Take by mouth. 3 daily  . pantoprazole (PROTONIX) 40 MG tablet TAKE 1 TABLET BY MOUTH ONCE DAILY  . pioglitazone (ACTOS) 15 MG tablet TAKE 1 TABLET BY MOUTH ONCE DAILY  . sertraline (ZOLOFT) 50 MG tablet TAKE 1 TABLET BY MOUTH ONCE DAILY  . tamsulosin (FLOMAX) 0.4 MG CAPS capsule Take 0.4 mg by mouth daily with breakfast.    . testosterone cypionate (DEPOTESTOTERONE CYPIONATE) 200 MG/ML injection Inject 200 mg into the muscle every 14 (fourteen) days.  . traZODone (DESYREL) 50 MG tablet TAKE 1 and 1/2 TABLETS BY MOUTH AT BEDTIME  . vitamin B-12 (CYANOCOBALAMIN) 100 MCG tablet Take 100 mcg by mouth daily.   No facility-administered encounter medications on file as  of 07/29/2019.   Review of Systems  Constitutional: Negative for appetite change and unexpected weight change.  Respiratory: Negative for cough, chest tightness and shortness of breath.   Cardiovascular: Negative for chest pain, palpitations and leg swelling.  Gastrointestinal: Negative for abdominal pain, diarrhea, nausea and vomiting.  Genitourinary: Negative for difficulty urinating and dysuria.  Musculoskeletal: Negative for back pain.       Knee pain as outlined.    Skin: Negative for color change and rash.  Neurological: Negative for dizziness, light-headedness and headaches.  Psychiatric/Behavioral: Negative for agitation and dysphoric mood.       Objective:    Physical Exam Constitutional:      General: He is not in acute distress.    Appearance: Normal appearance. He is well-developed.  HENT:     Head: Normocephalic and atraumatic.     Right Ear: External ear normal.     Left Ear: External ear normal.  Eyes:     General: No scleral icterus.       Right eye: No discharge.        Left eye: No discharge.     Conjunctiva/sclera: Conjunctivae normal.  Cardiovascular:     Rate and Rhythm: Normal rate and regular rhythm.  Pulmonary:     Effort: Pulmonary effort is normal. No respiratory distress.     Breath sounds: Normal breath sounds.  Abdominal:     General: Bowel sounds are normal.     Palpations: Abdomen is soft.     Tenderness: There is no abdominal tenderness.  Musculoskeletal:        General: No swelling or tenderness.     Cervical back: Neck supple. No tenderness.  Lymphadenopathy:     Cervical: No cervical  adenopathy.  Skin:    Findings: No erythema or rash.  Neurological:     Mental Status: He is alert.  Psychiatric:        Mood and Affect: Mood normal.        Behavior: Behavior normal.     BP 138/62   Pulse 72   Temp (!) 96.8 F (36 C)   Resp 16   Wt 205 lb 3.2 oz (93.1 kg)   SpO2 97%   BMI 30.30 kg/m  Wt Readings from Last 3 Encounters:  07/29/19 205 lb 3.2 oz (93.1 kg)  06/20/19 198 lb (89.8 kg)  06/20/19 198 lb (89.8 kg)     Lab Results  Component Value Date   WBC 8.5 06/10/2019   HGB 14.7 06/10/2019   HCT 43.8 06/10/2019   PLT 267.0 06/10/2019   GLUCOSE 130 (H) 06/10/2019   CHOL 140 06/10/2019   TRIG 227.0 (H) 06/10/2019   HDL 32.40 (L) 06/10/2019   LDLDIRECT 83.0 06/10/2019   LDLCALC 58 11/21/2013   ALT 20 06/10/2019   AST 20 06/10/2019   NA 139 06/10/2019   K 4.3 06/10/2019   CL 100 06/10/2019   CREATININE 0.86 06/10/2019   BUN 18 06/10/2019   CO2 31 06/10/2019   TSH 0.72 02/22/2019   PSA 0.82 06/10/2019   HGBA1C 6.5 06/10/2019   MICROALBUR 2.0 (H) 08/06/2018       Assessment & Plan:   Problem List Items Addressed This Visit    Back pain    No significant back pain.  Feels back ok.  Follow.        Diabetes mellitus ([place])    On trulicity and metformin.  Low carb diet and exercise.  Follow met b and a1c.  Hypertension    Blood pressure has been doing well.  Continue hctz, metoprolol, amlodipine.  Follow pressures.        Left knee pain    Knee pain and leg issues as outlined.  Has seen ortho as outlined.  Concerned regarding persistent issues.  Refer to Dr Binnie Rail for further evaluation.        Relevant Orders   Ambulatory referral to Orthopedic Surgery       Einar Pheasant, MD

## 2019-07-29 NOTE — Telephone Encounter (Signed)
I called pt and left vm regarding at [facility] on 08/02/2019 @ 3:45 pm.

## 2019-07-29 NOTE — Telephone Encounter (Signed)
Pt called and said he doesn't want to see a PA and he will be out of town next week. He wants you to call back and reschedule his appointment. I tried to get him to call [facility] and reschedule himself because he knows his own schedule but he said you could do it and call him back.

## 2019-08-02 DIAGNOSIS — G4733 Obstructive sleep apnea (adult) (pediatric): Secondary | ICD-10-CM | POA: Diagnosis not present

## 2019-08-05 DIAGNOSIS — M545 Low back pain: Secondary | ICD-10-CM | POA: Diagnosis not present

## 2019-08-06 ENCOUNTER — Encounter: Payer: Self-pay | Admitting: Internal Medicine

## 2019-08-06 NOTE — Assessment & Plan Note (Signed)
No significant back pain.  Feels back ok.  Follow.

## 2019-08-06 NOTE — Assessment & Plan Note (Signed)
On trulicity and metformin.  Low carb diet and exercise.  Follow met b and a1c.  

## 2019-08-06 NOTE — Assessment & Plan Note (Signed)
Blood pressure has been doing well.  Continue hctz, metoprolol, amlodipine.  Follow pressures.

## 2019-08-06 NOTE — Assessment & Plan Note (Signed)
Knee pain and leg issues as outlined.  Has seen ortho as outlined.  Concerned regarding persistent issues.  Refer to Dr Binnie Rail for further evaluation.

## 2019-08-08 DIAGNOSIS — M545 Low back pain: Secondary | ICD-10-CM | POA: Diagnosis not present

## 2019-08-10 DIAGNOSIS — M545 Low back pain: Secondary | ICD-10-CM | POA: Diagnosis not present

## 2019-08-12 ENCOUNTER — Other Ambulatory Visit: Payer: Self-pay | Admitting: Internal Medicine

## 2019-08-15 DIAGNOSIS — M545 Low back pain: Secondary | ICD-10-CM | POA: Diagnosis not present

## 2019-08-17 DIAGNOSIS — M545 Low back pain: Secondary | ICD-10-CM | POA: Diagnosis not present

## 2019-08-23 DIAGNOSIS — M545 Low back pain: Secondary | ICD-10-CM | POA: Diagnosis not present

## 2019-08-25 ENCOUNTER — Telehealth: Payer: Self-pay | Admitting: Internal Medicine

## 2019-08-25 DIAGNOSIS — M545 Low back pain: Secondary | ICD-10-CM | POA: Diagnosis not present

## 2019-08-25 NOTE — Telephone Encounter (Signed)
Placed in your red folder for signature.

## 2019-08-25 NOTE — Telephone Encounter (Signed)
Ben dropped off a English as a second language teacher Prescription" form for a cold therpay compression hip wrap that is no cost to the pt. He states that his coworker accidentally sent over the wrong form and he wanted to bring the correct one in office. Placed in colored folder up front. Please fax back to number on form.

## 2019-08-26 ENCOUNTER — Other Ambulatory Visit: Payer: Self-pay | Admitting: Internal Medicine

## 2019-08-26 DIAGNOSIS — E119 Type 2 diabetes mellitus without complications: Secondary | ICD-10-CM

## 2019-08-30 DIAGNOSIS — M545 Low back pain: Secondary | ICD-10-CM | POA: Diagnosis not present

## 2019-08-30 NOTE — Telephone Encounter (Signed)
Faxed

## 2019-08-30 NOTE — Telephone Encounter (Signed)
Notify I was not in the office Friday.  Reviewed form.  Please have them clarify exactly what they will be doing and what is needed.  On the form it has a section to complete for DVT risk.  Does this need to be completed and just need to know more specifics about the treatment.

## 2019-08-30 NOTE — Telephone Encounter (Signed)
Signed and placed in box.   

## 2019-08-30 NOTE — Telephone Encounter (Signed)
DVT assessment is only for patients who are in the hospital. No need to complete. They just need your signature. It is 2 ice packs with compression component that goes around his hip. This way he will be able to move around and have more mobility while using ice. Suezanne Jacquet stated that they work closely with ortho patients and physical therapy patients.

## 2019-09-01 DIAGNOSIS — M545 Low back pain: Secondary | ICD-10-CM | POA: Diagnosis not present

## 2019-09-02 DIAGNOSIS — M25551 Pain in right hip: Secondary | ICD-10-CM | POA: Diagnosis not present

## 2019-09-02 DIAGNOSIS — M25562 Pain in left knee: Secondary | ICD-10-CM | POA: Diagnosis not present

## 2019-09-05 ENCOUNTER — Other Ambulatory Visit: Payer: Self-pay | Admitting: Internal Medicine

## 2019-09-05 NOTE — Telephone Encounter (Signed)
Refill request for Ricardo Nash, last seen 07-29-19, last filled 06-24-19.  Please advise.

## 2019-09-06 NOTE — Telephone Encounter (Signed)
rx sent in for alprazolam #30 with no refills.   

## 2019-09-12 DIAGNOSIS — M545 Low back pain: Secondary | ICD-10-CM | POA: Diagnosis not present

## 2019-09-13 DIAGNOSIS — M25562 Pain in left knee: Secondary | ICD-10-CM | POA: Diagnosis not present

## 2019-09-14 DIAGNOSIS — M545 Low back pain: Secondary | ICD-10-CM | POA: Diagnosis not present

## 2019-09-20 DIAGNOSIS — M545 Low back pain: Secondary | ICD-10-CM | POA: Diagnosis not present

## 2019-09-23 DIAGNOSIS — M545 Low back pain: Secondary | ICD-10-CM | POA: Diagnosis not present

## 2019-09-26 DIAGNOSIS — M545 Low back pain: Secondary | ICD-10-CM | POA: Diagnosis not present

## 2019-09-28 DIAGNOSIS — M545 Low back pain: Secondary | ICD-10-CM | POA: Diagnosis not present

## 2019-09-29 DIAGNOSIS — M25551 Pain in right hip: Secondary | ICD-10-CM | POA: Diagnosis not present

## 2019-09-29 DIAGNOSIS — M25562 Pain in left knee: Secondary | ICD-10-CM | POA: Diagnosis not present

## 2019-10-05 DIAGNOSIS — M545 Low back pain: Secondary | ICD-10-CM | POA: Diagnosis not present

## 2019-10-07 DIAGNOSIS — M545 Low back pain: Secondary | ICD-10-CM | POA: Diagnosis not present

## 2019-10-11 DIAGNOSIS — M545 Low back pain: Secondary | ICD-10-CM | POA: Diagnosis not present

## 2019-10-18 ENCOUNTER — Other Ambulatory Visit (INDEPENDENT_AMBULATORY_CARE_PROVIDER_SITE_OTHER): Payer: Medicare PPO

## 2019-10-18 ENCOUNTER — Other Ambulatory Visit: Payer: Self-pay

## 2019-10-18 DIAGNOSIS — E7800 Pure hypercholesterolemia, unspecified: Secondary | ICD-10-CM | POA: Diagnosis not present

## 2019-10-18 DIAGNOSIS — E1159 Type 2 diabetes mellitus with other circulatory complications: Secondary | ICD-10-CM

## 2019-10-18 LAB — BASIC METABOLIC PANEL
BUN: 21 mg/dL (ref 6–23)
CO2: 29 mEq/L (ref 19–32)
Calcium: 9.7 mg/dL (ref 8.4–10.5)
Chloride: 103 mEq/L (ref 96–112)
Creatinine, Ser: 0.88 mg/dL (ref 0.40–1.50)
GFR: 84.34 mL/min (ref >60.00)
Glucose, Bld: 146 mg/dL — ABNORMAL HIGH (ref 70–99)
Potassium: 4.3 mEq/L (ref 3.5–5.1)
Sodium: 140 mEq/L (ref 135–145)

## 2019-10-18 LAB — MICROALBUMIN / CREATININE URINE RATIO
Creatinine,U: 94 mg/dL
Microalb Creat Ratio: 1.6 mg/g (ref 0.0–30.0)
Microalb, Ur: 1.5 mg/dL (ref 0.0–1.9)

## 2019-10-18 LAB — LIPID PANEL
Cholesterol: 138 mg/dL (ref 0–200)
HDL: 33.1 mg/dL — ABNORMAL LOW (ref >39.00)
NonHDL: 105.13
Total CHOL/HDL Ratio: 4
Triglycerides: 285 mg/dL — ABNORMAL HIGH (ref 0.0–149.0)
VLDL: 57 mg/dL — ABNORMAL HIGH (ref 0.0–40.0)

## 2019-10-18 LAB — HEPATIC FUNCTION PANEL
ALT: 20 U/L (ref 0–53)
AST: 20 U/L (ref 0–37)
Albumin: 4.3 g/dL (ref 3.5–5.2)
Alkaline Phosphatase: 41 U/L (ref 39–117)
Bilirubin, Direct: 0.2 mg/dL (ref 0.0–0.3)
Total Bilirubin: 0.7 mg/dL (ref 0.2–1.2)
Total Protein: 6.4 g/dL (ref 6.0–8.3)

## 2019-10-18 LAB — HEMOGLOBIN A1C: Hgb A1c MFr Bld: 6.9 % — ABNORMAL HIGH (ref 4.6–6.5)

## 2019-10-18 LAB — LDL CHOLESTEROL, DIRECT: Direct LDL: 74 mg/dL

## 2019-10-19 DIAGNOSIS — M25551 Pain in right hip: Secondary | ICD-10-CM | POA: Diagnosis not present

## 2019-10-20 ENCOUNTER — Ambulatory Visit: Payer: Medicare PPO | Admitting: Internal Medicine

## 2019-10-20 ENCOUNTER — Other Ambulatory Visit: Payer: Self-pay

## 2019-10-20 ENCOUNTER — Encounter: Payer: Self-pay | Admitting: Internal Medicine

## 2019-10-20 DIAGNOSIS — M109 Gout, unspecified: Secondary | ICD-10-CM | POA: Diagnosis not present

## 2019-10-20 DIAGNOSIS — M545 Low back pain, unspecified: Secondary | ICD-10-CM

## 2019-10-20 DIAGNOSIS — F419 Anxiety disorder, unspecified: Secondary | ICD-10-CM | POA: Diagnosis not present

## 2019-10-20 DIAGNOSIS — E7800 Pure hypercholesterolemia, unspecified: Secondary | ICD-10-CM

## 2019-10-20 DIAGNOSIS — K219 Gastro-esophageal reflux disease without esophagitis: Secondary | ICD-10-CM

## 2019-10-20 DIAGNOSIS — I10 Essential (primary) hypertension: Secondary | ICD-10-CM

## 2019-10-20 DIAGNOSIS — D509 Iron deficiency anemia, unspecified: Secondary | ICD-10-CM

## 2019-10-20 DIAGNOSIS — G4733 Obstructive sleep apnea (adult) (pediatric): Secondary | ICD-10-CM | POA: Diagnosis not present

## 2019-10-20 DIAGNOSIS — M25551 Pain in right hip: Secondary | ICD-10-CM

## 2019-10-20 DIAGNOSIS — E1159 Type 2 diabetes mellitus with other circulatory complications: Secondary | ICD-10-CM | POA: Diagnosis not present

## 2019-10-20 NOTE — Progress Notes (Signed)
Patient ID: Ricardo Nash, male   DOB: Nov 19, 1945, 74 y.o.   MRN: [id number]   Subjective:    Patient ID: Ricardo Nash, male    DOB: October 31, 1945, 74 y.o.   MRN: [id number]  HPI This visit occurred during the SARS-CoV-2 public health emergency.  Safety protocols were in place, including screening questions prior to the visit, additional usage of staff PPE, and extensive cleaning of exam room while observing appropriate contact time as indicated for disinfecting solutions.  Patient here for a scheduled follow up.  He has previously had problems with his left knee (left knee pain).  Ortho evaluation.  MRI - felt to be gout flare.  Better now.  He is now having increased pain in his right hip.  Seeing Dr Wynelle Link.  Saw Dr Nelva Bush yesterday - s/p injection.  Increased pain.  Not able to be as active.  Has fallen three times since 06/2019.  One fall - working in his garden and tripped over a board.  He felt was related to not being able to pick up his foot well - due to pain.  States he has had constant pain in his hip for two months.  Has been doing exercises at home.  Has tried a nerve stimulator (at home).  Taking motrin and tylenol.  Still with persistent pain.  No chest pain or sob reported.  No acid reflux.  No abdominal pain.  Bowels not as regular, but are moving.  Takes probiotics daily.  Was inquiring about pain medication.  Outside blood pressure checks reviewed - averaging 118-130s/50-60s.  Sugars reviewed - averaging 120-140s.    Past Medical History:  Diagnosis Date  . C. difficile colitis   . Coronary artery disease   . Depression   . Diabetes mellitus ([place])   . Diverticulitis   . Diverticulosis   . Environmental allergies   . GERD (gastroesophageal reflux disease)   . Goiter    intrathoracic, s/p benign biopsy (Dr Harlow Asa)  . Hypercholesterolemia   . Hypertension   . Osteoarthritis    cervical spine, lumbar spine  . Paroxysmal atrial fibrillation (H[facility])   . PONV (postoperative  nausea and vomiting)   . Sleep apnea   . Stroke ([place])   . TIA (transient ischemic attack)    Past Surgical History:  Procedure Laterality Date  . BACK SURGERY  8/08   s/p fusion of L4-L5  . capsule endoscopy    . CATARACT EXTRACTION  2011   Dr. Herbert Deaner  . [place] SURGERY  2002  . COLONOSCOPY WITH PROPOFOL N/A 07/20/2017   Procedure: COLONOSCOPY WITH PROPOFOL;  Surgeon: Manya Silvas, MD;  Location: [facility] ENDOSCOPY;  Service: Endoscopy;  Laterality: N/A;  . EYE SURGERY     cataract bilateral 10/1999  . eyelid reduction     Dr. Carlis Abbott  . INGUINAL HERNIA REPAIR  1991   Dr, Tamala Julian  . NOSE SURGERY     turbinate reduction  . SEPTOPLASTY  1975  . SHOULDER SURGERY  2000   rotator cuff   Family History  Problem Relation Age of Onset  . Congestive Heart Failure Father   . Heart disease Father        myocardial infarction  . Rheumatic fever Father        valvular disease  . Heart disease Mother        s/p CABG (age 30)  . Kidney disease Sister   . Colon cancer Neg Hx   . Prostate cancer Neg  Hx    Social History   Socioeconomic History  . Marital status: Married    Spouse name: Not on file  . Number of children: 2  . Years of education: Not on file  . Highest education level: Not on file  Occupational History  . Occupation: retired Pharmacist, hospital  Tobacco Use  . Smoking status: Former Research scientist (life sciences)  . Smokeless tobacco: Never Used  Vaping Use  . Vaping Use: Never used  Substance and Sexual Activity  . Alcohol use: Yes    Alcohol/week: 0.0 standard drinks    Comment: occasional  . Drug use: No  . Sexual activity: Not Currently  Other Topics Concern  . Not on file  Social History Narrative  . Not on file   Social Determinants of Health   Financial Resource Strain:   . Difficulty of Paying Living Expenses:   Food Insecurity:   . Worried About Charity fundraiser in the Last Year:   . Arboriculturist in the Last Year:   Transportation Needs:   . Film/video editor  (Medical):   Marland Kitchen Lack of Transportation (Non-Medical):   Physical Activity:   . Days of Exercise per Week:   . Minutes of Exercise per Session:   Stress:   . Feeling of Stress :   Social Connections:   . Frequency of Communication with Friends and Family:   . Frequency of Social Gatherings with Friends and Family:   . Attends Religious Services:   . Active Member of Clubs or Organizations:   . Attends Archivist Meetings:   Marland Kitchen Marital Status:     Outpatient Encounter Medications as of 10/20/2019  Medication Sig  . ACCU-CHEK AVIVA PLUS test strip TEST SUGAR TWICE DAILY  . ALPRAZolam (XANAX) 0.25 MG tablet TAKE 1 TABLET BY MOUTH ONCE DAILY AS NEEDED  . amLODipine (NORVASC) 10 MG tablet TAKE 1 TABLET BY MOUTH ONCE DAILY  . Ascorbic Acid (VITAMIN C) 100 MG tablet Take 100 mg by mouth daily.  Marland Kitchen aspirin 81 MG tablet Take 81 mg by mouth daily.  Marland Kitchen atorvastatin (LIPITOR) 10 MG tablet TAKE 1 TABLET BY MOUTH ONCE DAILY  . Azelastine-Fluticasone (DYMISTA NA) Place 2 puffs into the nose daily.  . clopidogrel (PLAVIX) 75 MG tablet TAKE 1 TABLET BY MOUTH ONCE DAILY  . hydrochlorothiazide (HYDRODIURIL) 50 MG tablet Take 1 tablet (50 mg total) by mouth daily.  Marland Kitchen levocetirizine (XYZAL) 5 MG tablet Take 5 mg by mouth every evening.  Marland Kitchen lisinopril (ZESTRIL) 40 MG tablet Take 1 tablet (40 mg total) by mouth daily.  . metFORMIN (GLUCOPHAGE) 1000 MG tablet TAKE 1 TABLET BY MOUTH TWICE DAILY WITH MEALS  . metoprolol succinate (TOPROL-XL) 25 MG 24 hr tablet Take 1 tablet (25 mg total) by mouth 2 (two) times daily.  . montelukast (SINGULAIR) 10 MG tablet Take 10 mg by mouth at bedtime.  . Multiple Vitamin (MULTIVITAMIN) tablet Take 1 tablet by mouth daily.  . Omega-3 Fatty Acids (FISH OIL) 1200 MG CAPS Take by mouth. 3 daily  . pantoprazole (PROTONIX) 40 MG tablet TAKE 1 TABLET BY MOUTH ONCE DAILY  . pioglitazone (ACTOS) 15 MG tablet TAKE 1 TABLET BY MOUTH ONCE DAILY  . sertraline (ZOLOFT) 50 MG  tablet TAKE 1 TABLET BY MOUTH ONCE DAILY  . tamsulosin (FLOMAX) 0.4 MG CAPS capsule Take 0.4 mg by mouth daily with breakfast.   . testosterone cypionate (DEPOTESTOTERONE CYPIONATE) 200 MG/ML injection Inject 200 mg into the muscle every 14 (  fourteen) days.  . traZODone (DESYREL) 50 MG tablet TAKE 1 and 1/2 TABLETS BY MOUTH AT BEDTIME  . TRULICITY 8.01 KP/5.3ZS SOPN INJECT 0.75MG SUBQ ONCE A WEEK  . vitamin B-12 (CYANOCOBALAMIN) 100 MCG tablet Take 100 mcg by mouth daily.   No facility-administered encounter medications on file as of 10/20/2019.    Review of Systems  Constitutional: Negative for appetite change and unexpected weight change.  HENT: Negative for congestion and sinus pressure.   Respiratory: Negative for cough, chest tightness and shortness of breath.   Cardiovascular: Negative for chest pain, palpitations and leg swelling.  Gastrointestinal: Negative for abdominal pain, diarrhea and nausea.  Genitourinary: Negative for difficulty urinating and dysuria.  Musculoskeletal: Negative for joint swelling and myalgias.       Increased hip pain as outlined.    Neurological: Negative for dizziness, light-headedness and headaches.  Psychiatric/Behavioral: Negative for agitation and dysphoric mood.       Objective:    Physical Exam Constitutional:      General: He is not in acute distress.    Appearance: Normal appearance. He is well-developed.  HENT:     Head: Normocephalic and atraumatic.     Right Ear: External ear normal.     Left Ear: External ear normal.  Eyes:     General: No scleral icterus.       Right eye: No discharge.        Left eye: No discharge.     Conjunctiva/sclera: Conjunctivae normal.  Cardiovascular:     Rate and Rhythm: Normal rate and regular rhythm.  Pulmonary:     Effort: Pulmonary effort is normal. No respiratory distress.     Breath sounds: Normal breath sounds.  Abdominal:     General: Bowel sounds are normal.     Palpations: Abdomen is  soft.     Tenderness: There is no abdominal tenderness.  Musculoskeletal:        General: No swelling or tenderness.  Skin:    Findings: No erythema or rash.  Neurological:     Mental Status: He is alert.  Psychiatric:        Mood and Affect: Mood normal.        Behavior: Behavior normal.     BP 136/82   Pulse 73   Temp 97.6 F (36.4 C)   Resp 16   Wt 200 lb (90.7 kg)   SpO2 98%   BMI 29.53 kg/m  Wt Readings from Last 3 Encounters:  10/20/19 200 lb (90.7 kg)  07/29/19 205 lb 3.2 oz (93.1 kg)  06/20/19 198 lb (89.8 kg)     Lab Results  Component Value Date   WBC 8.5 06/10/2019   HGB 14.7 06/10/2019   HCT 43.8 06/10/2019   PLT 267.0 06/10/2019   GLUCOSE 146 (H) 10/18/2019   CHOL 138 10/18/2019   TRIG 285.0 (H) 10/18/2019   HDL 33.10 (L) 10/18/2019   LDLDIRECT 74.0 10/18/2019   LDLCALC 58 11/21/2013   ALT 20 10/18/2019   AST 20 10/18/2019   NA 140 10/18/2019   K 4.3 10/18/2019   CL 103 10/18/2019   CREATININE 0.88 10/18/2019   BUN 21 10/18/2019   CO2 29 10/18/2019   TSH 0.72 02/22/2019   PSA 0.82 06/10/2019   HGBA1C 6.9 (H) 10/18/2019   MICROALBUR 1.5 10/18/2019       Assessment & Plan:   Problem List Items Addressed This Visit    Anemia, iron deficiency    Follow cbc.  Anxiety    On zoloft.  Has xanax if needed.        Back pain    Increased hip pain.  S/p injection yesterday.  Question if originating from his back.  Continue f/u with ortho.        Diabetes mellitus ([place])    On trulicity and metformin.  Low carb diet and exercise.  Follow met b and a1c.   Lab Results  Component Value Date   HGBA1C 6.9 (H) 10/18/2019        Relevant Orders   Hemoglobin G4F   Basic metabolic panel   GERD (gastroesophageal reflux disease)    On protonix.  Controlled.        Gout    Recent flare - knee.  Better now.  May need to adjust medication - hctz.  Follow.        Hypercholesterolemia    On lipitor.  Low cholesterol diet and exercise.   Follow lipid panel and liver function tests.        Relevant Orders   Lipid panel   Hepatic function panel   Hypertension    Blood pressure as outlined.  Currently on lisinopril, hctz, metoprolol and amlodipine.  Follow pressures.  Follow metabolic panel.        Relevant Orders   TSH   Obstructive sleep apnea    CPAP.       Right hip pain    Increased pain.  Limits his activity.  Seeing ortho.  S/p injection yesterday.  Notified Dr Synthia Innocent and Dr Nelva Bush office regarding his increased pain and need for further pain management.            Einar Pheasant, MD

## 2019-10-21 ENCOUNTER — Telehealth: Payer: Self-pay | Admitting: Internal Medicine

## 2019-10-21 NOTE — Telephone Encounter (Signed)
Patient is aware 

## 2019-10-21 NOTE — Telephone Encounter (Signed)
Please notify Mr Karnes that I have contacted Dr Aliusio's office and he should be getting a call from them.  Let us know if any problems.

## 2019-10-23 ENCOUNTER — Encounter: Payer: Self-pay | Admitting: Internal Medicine

## 2019-10-23 DIAGNOSIS — M25551 Pain in right hip: Secondary | ICD-10-CM | POA: Insufficient documentation

## 2019-10-23 NOTE — Assessment & Plan Note (Addendum)
Blood pressure as outlined.  Currently on lisinopril, hctz, metoprolol and amlodipine.  Follow pressures.  Follow metabolic panel.

## 2019-10-23 NOTE — Assessment & Plan Note (Signed)
On protonix.  Controlled.   

## 2019-10-23 NOTE — Assessment & Plan Note (Signed)
Increased hip pain.  S/p injection yesterday.  Question if originating from his back.  Continue f/u with ortho.

## 2019-10-23 NOTE — Assessment & Plan Note (Signed)
Recent flare - knee.  Better now.  May need to adjust medication - hctz.  Follow.

## 2019-10-23 NOTE — Assessment & Plan Note (Signed)
On trulicity and metformin.  Low carb diet and exercise.  Follow met b and a1c.   Lab Results  Component Value Date   HGBA1C 6.9 (H) 10/18/2019

## 2019-10-23 NOTE — Assessment & Plan Note (Signed)
CPAP.  

## 2019-10-23 NOTE — Assessment & Plan Note (Signed)
On lipitor.  Low cholesterol diet and exercise.  Follow lipid panel and liver function tests.   

## 2019-10-23 NOTE — Assessment & Plan Note (Signed)
On zoloft.  Has xanax if needed.

## 2019-10-23 NOTE — Assessment & Plan Note (Signed)
Increased pain.  Limits his activity.  Seeing ortho.  S/p injection yesterday.  Notified Dr Synthia Innocent and Dr Nelva Bush office regarding his increased pain and need for further pain management.

## 2019-10-23 NOTE — Assessment & Plan Note (Signed)
Follow cbc.  

## 2019-10-26 DIAGNOSIS — E291 Testicular hypofunction: Secondary | ICD-10-CM | POA: Diagnosis not present

## 2019-11-04 ENCOUNTER — Other Ambulatory Visit: Payer: Self-pay | Admitting: Internal Medicine

## 2019-11-04 DIAGNOSIS — E291 Testicular hypofunction: Secondary | ICD-10-CM | POA: Diagnosis not present

## 2019-11-04 DIAGNOSIS — N529 Male erectile dysfunction, unspecified: Secondary | ICD-10-CM | POA: Diagnosis not present

## 2019-11-04 DIAGNOSIS — N400 Enlarged prostate without lower urinary tract symptoms: Secondary | ICD-10-CM | POA: Diagnosis not present

## 2019-11-09 DIAGNOSIS — M5416 Radiculopathy, lumbar region: Secondary | ICD-10-CM | POA: Diagnosis not present

## 2019-11-09 DIAGNOSIS — I700 Atherosclerosis of aorta: Secondary | ICD-10-CM | POA: Diagnosis not present

## 2019-11-09 DIAGNOSIS — M545 Low back pain: Secondary | ICD-10-CM | POA: Diagnosis not present

## 2019-11-09 DIAGNOSIS — I10 Essential (primary) hypertension: Secondary | ICD-10-CM | POA: Diagnosis not present

## 2019-11-11 ENCOUNTER — Telehealth: Payer: Self-pay | Admitting: Internal Medicine

## 2019-11-11 MED ORDER — BLOOD GLUCOSE METER KIT: PACK | 0 refills | Status: AC

## 2019-11-11 MED ORDER — ACCU-CHEK AVIVA PLUS VI STRP: ORAL_STRIP | 3 refills | Status: DC

## 2019-11-11 NOTE — Telephone Encounter (Signed)
Pt needs a new rx for accu chek aviva meter and test strips sent to [facility]

## 2019-11-21 ENCOUNTER — Other Ambulatory Visit: Payer: Self-pay | Admitting: Internal Medicine

## 2019-12-02 DIAGNOSIS — M545 Low back pain: Secondary | ICD-10-CM | POA: Diagnosis not present

## 2019-12-02 DIAGNOSIS — M25562 Pain in left knee: Secondary | ICD-10-CM | POA: Diagnosis not present

## 2019-12-05 ENCOUNTER — Other Ambulatory Visit: Payer: Self-pay | Admitting: Internal Medicine

## 2019-12-09 DIAGNOSIS — M5416 Radiculopathy, lumbar region: Secondary | ICD-10-CM | POA: Diagnosis not present

## 2019-12-22 DIAGNOSIS — J300 Vasomotor rhinitis: Secondary | ICD-10-CM | POA: Diagnosis not present

## 2019-12-22 DIAGNOSIS — R05 Cough: Secondary | ICD-10-CM | POA: Diagnosis not present

## 2019-12-28 DIAGNOSIS — B078 Other viral warts: Secondary | ICD-10-CM | POA: Diagnosis not present

## 2019-12-28 DIAGNOSIS — R208 Other disturbances of skin sensation: Secondary | ICD-10-CM | POA: Diagnosis not present

## 2019-12-28 DIAGNOSIS — L821 Other seborrheic keratosis: Secondary | ICD-10-CM | POA: Diagnosis not present

## 2019-12-28 DIAGNOSIS — D2262 Melanocytic nevi of left upper limb, including shoulder: Secondary | ICD-10-CM | POA: Diagnosis not present

## 2019-12-28 DIAGNOSIS — D2261 Melanocytic nevi of right upper limb, including shoulder: Secondary | ICD-10-CM | POA: Diagnosis not present

## 2019-12-28 DIAGNOSIS — D225 Melanocytic nevi of trunk: Secondary | ICD-10-CM | POA: Diagnosis not present

## 2020-01-06 DIAGNOSIS — I480 Paroxysmal atrial fibrillation: Secondary | ICD-10-CM | POA: Diagnosis not present

## 2020-01-06 DIAGNOSIS — R55 Syncope and collapse: Secondary | ICD-10-CM | POA: Diagnosis not present

## 2020-01-06 DIAGNOSIS — Z6833 Body mass index (BMI) 33.0-33.9, adult: Secondary | ICD-10-CM | POA: Diagnosis not present

## 2020-01-06 DIAGNOSIS — G4733 Obstructive sleep apnea (adult) (pediatric): Secondary | ICD-10-CM | POA: Diagnosis not present

## 2020-01-06 DIAGNOSIS — I10 Essential (primary) hypertension: Secondary | ICD-10-CM | POA: Diagnosis not present

## 2020-01-06 DIAGNOSIS — E118 Type 2 diabetes mellitus with unspecified complications: Secondary | ICD-10-CM | POA: Diagnosis not present

## 2020-02-04 ENCOUNTER — Other Ambulatory Visit: Payer: Self-pay | Admitting: Internal Medicine

## 2020-02-06 DIAGNOSIS — Z03818 Encounter for observation for suspected exposure to other biological agents ruled out: Secondary | ICD-10-CM | POA: Diagnosis not present

## 2020-02-06 DIAGNOSIS — Z1152 Encounter for screening for COVID-19: Secondary | ICD-10-CM | POA: Diagnosis not present

## 2020-02-07 DIAGNOSIS — J0100 Acute maxillary sinusitis, unspecified: Secondary | ICD-10-CM | POA: Diagnosis not present

## 2020-02-08 ENCOUNTER — Other Ambulatory Visit: Payer: Self-pay | Admitting: Internal Medicine

## 2020-02-08 NOTE — Telephone Encounter (Signed)
rx ok'd fof alprazolam #30 with no refills.

## 2020-02-13 DIAGNOSIS — M5416 Radiculopathy, lumbar region: Secondary | ICD-10-CM | POA: Diagnosis not present

## 2020-02-13 DIAGNOSIS — Z79891 Long term (current) use of opiate analgesic: Secondary | ICD-10-CM | POA: Diagnosis not present

## 2020-02-17 ENCOUNTER — Ambulatory Visit (INDEPENDENT_AMBULATORY_CARE_PROVIDER_SITE_OTHER): Payer: Medicare PPO

## 2020-02-17 ENCOUNTER — Other Ambulatory Visit: Payer: Self-pay

## 2020-02-17 DIAGNOSIS — Z23 Encounter for immunization: Secondary | ICD-10-CM | POA: Diagnosis not present

## 2020-02-24 ENCOUNTER — Other Ambulatory Visit: Payer: Self-pay

## 2020-02-24 ENCOUNTER — Other Ambulatory Visit (INDEPENDENT_AMBULATORY_CARE_PROVIDER_SITE_OTHER): Payer: Medicare PPO

## 2020-02-24 DIAGNOSIS — E7800 Pure hypercholesterolemia, unspecified: Secondary | ICD-10-CM | POA: Diagnosis not present

## 2020-02-24 DIAGNOSIS — E1159 Type 2 diabetes mellitus with other circulatory complications: Secondary | ICD-10-CM | POA: Diagnosis not present

## 2020-02-24 DIAGNOSIS — I10 Essential (primary) hypertension: Secondary | ICD-10-CM

## 2020-02-24 LAB — TSH: TSH: 0.49 u[IU]/mL (ref 0.35–4.50)

## 2020-02-24 LAB — HEPATIC FUNCTION PANEL
ALT: 20 U/L (ref 0–53)
AST: 21 U/L (ref 0–37)
Albumin: 4 g/dL (ref 3.5–5.2)
Alkaline Phosphatase: 43 U/L (ref 39–117)
Bilirubin, Direct: 0.2 mg/dL (ref 0.0–0.3)
Total Bilirubin: 0.7 mg/dL (ref 0.2–1.2)
Total Protein: 6.2 g/dL (ref 6.0–8.3)

## 2020-02-24 LAB — BASIC METABOLIC PANEL
BUN: 19 mg/dL (ref 6–23)
CO2: 29 mEq/L (ref 19–32)
Calcium: 9.5 mg/dL (ref 8.4–10.5)
Chloride: 100 mEq/L (ref 96–112)
Creatinine, Ser: 0.89 mg/dL (ref 0.40–1.50)
GFR: 83.21 mL/min (ref >60.00)
Glucose, Bld: 132 mg/dL — ABNORMAL HIGH (ref 70–99)
Potassium: 4.3 mEq/L (ref 3.5–5.1)
Sodium: 139 mEq/L (ref 135–145)

## 2020-02-24 LAB — LIPID PANEL
Cholesterol: 136 mg/dL (ref 0–200)
HDL: 33.1 mg/dL — ABNORMAL LOW (ref >39.00)
NonHDL: 102.58
Total CHOL/HDL Ratio: 4
Triglycerides: 275 mg/dL — ABNORMAL HIGH (ref 0.0–149.0)
VLDL: 55 mg/dL — ABNORMAL HIGH (ref 0.0–40.0)

## 2020-02-24 LAB — HEMOGLOBIN A1C: Hgb A1c MFr Bld: 7.4 % — ABNORMAL HIGH (ref 4.6–6.5)

## 2020-02-24 LAB — LDL CHOLESTEROL, DIRECT: Direct LDL: 77 mg/dL

## 2020-02-28 ENCOUNTER — Encounter: Payer: Self-pay | Admitting: Internal Medicine

## 2020-02-28 ENCOUNTER — Ambulatory Visit (INDEPENDENT_AMBULATORY_CARE_PROVIDER_SITE_OTHER): Payer: Medicare PPO | Admitting: Internal Medicine

## 2020-02-28 ENCOUNTER — Other Ambulatory Visit: Payer: Self-pay

## 2020-02-28 VITALS — BP 136/68 | HR 75 | Temp 98.3°F | Resp 16 | Ht 69.0 in | Wt 201.0 lb

## 2020-02-28 DIAGNOSIS — E7800 Pure hypercholesterolemia, unspecified: Secondary | ICD-10-CM | POA: Diagnosis not present

## 2020-02-28 DIAGNOSIS — I10 Essential (primary) hypertension: Secondary | ICD-10-CM

## 2020-02-28 DIAGNOSIS — E049 Nontoxic goiter, unspecified: Secondary | ICD-10-CM

## 2020-02-28 DIAGNOSIS — K219 Gastro-esophageal reflux disease without esophagitis: Secondary | ICD-10-CM

## 2020-02-28 DIAGNOSIS — Z0000 Encounter for general adult medical examination without abnormal findings: Secondary | ICD-10-CM

## 2020-02-28 DIAGNOSIS — D509 Iron deficiency anemia, unspecified: Secondary | ICD-10-CM

## 2020-02-28 DIAGNOSIS — M545 Low back pain, unspecified: Secondary | ICD-10-CM | POA: Diagnosis not present

## 2020-02-28 DIAGNOSIS — E1159 Type 2 diabetes mellitus with other circulatory complications: Secondary | ICD-10-CM

## 2020-02-28 DIAGNOSIS — F419 Anxiety disorder, unspecified: Secondary | ICD-10-CM | POA: Diagnosis not present

## 2020-02-28 DIAGNOSIS — G4733 Obstructive sleep apnea (adult) (pediatric): Secondary | ICD-10-CM | POA: Diagnosis not present

## 2020-02-28 MED ORDER — METOPROLOL SUCCINATE ER 25 MG PO TB24
25.0000 mg | ORAL_TABLET | Freq: Two times a day (BID) | ORAL | 3 refills | Status: DC
Start: 2020-02-28 — End: 2020-10-04

## 2020-02-28 MED ORDER — PIOGLITAZONE HCL 15 MG PO TABS: 15.0000 mg | ORAL_TABLET | Freq: Every day | ORAL | 1 refills | Status: DC

## 2020-02-28 MED ORDER — HYDROCHLOROTHIAZIDE 50 MG PO TABS: 50.0000 mg | ORAL_TABLET | Freq: Every day | ORAL | 1 refills | Status: DC

## 2020-02-28 MED ORDER — ATORVASTATIN CALCIUM 10 MG PO TABS
10.0000 mg | ORAL_TABLET | Freq: Every day | ORAL | 3 refills | Status: DC
Start: 2020-02-28 — End: 2020-11-19

## 2020-02-28 NOTE — Assessment & Plan Note (Signed)
Physical today 02/28/20.  Followed by urology for prostate checks.  Colonoscopy 07/20/17 - tubular adenomatous polyps.

## 2020-02-28 NOTE — Progress Notes (Signed)
Patient ID: Stephan Nelis, male   DOB: 02/10/1946, 74 y.o.   MRN: [id number]   Subjective:    Patient ID: Ricardo Nash, male    DOB: 10-12-1945, 74 y.o.   MRN: [id number]  HPI This visit occurred during the SARS-CoV-2 public health emergency.  Safety protocols were in place, including screening questions prior to the visit, additional usage of staff PPE, and extensive cleaning of exam room while observing appropriate contact time as indicated for disinfecting solutions.  Patient here for his physical exam.  Having increased back pain. Seeing ortho. Planning for injection 03/15/20.  Taking norco.  Taking 2/day at most.  Some days takes none.  If he sits a long period of time - aggravates.  Some right thigh weaknes.  Uses a cane to help with balance.  No falls since using the cane.  Going to the gym - 3-4 days/week.  Riding a stationary bike.  No chest pain.  Breathing stable.  Three weeks ago - sinus issues.  Took prednisone.  Sinus symptoms resolved.  Some decreased appetite.  Some constipation - taking stool softener.  No acid reflux reported.  Discussed labs - a1c 7.4.    Past Medical History:  Diagnosis Date  . C. difficile colitis   . Coronary artery disease   . Depression   . Diabetes mellitus ([place])   . Diverticulitis   . Diverticulosis   . Environmental allergies   . GERD (gastroesophageal reflux disease)   . Goiter    intrathoracic, s/p benign biopsy (Dr Harlow Asa)  . Hypercholesterolemia   . Hypertension   . Osteoarthritis    cervical spine, lumbar spine  . Paroxysmal atrial fibrillation (H[facility])   . PONV (postoperative nausea and vomiting)   . Sleep apnea   . Stroke ([place])   . TIA (transient ischemic attack)    Past Surgical History:  Procedure Laterality Date  . BACK SURGERY  8/08   s/p fusion of L4-L5  . capsule endoscopy    . CATARACT EXTRACTION  2011   Dr. Herbert Deaner  . [place] SURGERY  2002  . COLONOSCOPY WITH PROPOFOL N/A 07/20/2017   Procedure: COLONOSCOPY  WITH PROPOFOL;  Surgeon: Manya Silvas, MD;  Location: [facility] ENDOSCOPY;  Service: Endoscopy;  Laterality: N/A;  . EYE SURGERY     cataract bilateral 10/1999  . eyelid reduction     Dr. Carlis Abbott  . INGUINAL HERNIA REPAIR  1991   Dr, Tamala Julian  . NOSE SURGERY     turbinate reduction  . SEPTOPLASTY  1975  . SHOULDER SURGERY  2000   rotator cuff   Family History  Problem Relation Age of Onset  . Congestive Heart Failure Father   . Heart disease Father        myocardial infarction  . Rheumatic fever Father        valvular disease  . Heart disease Mother        s/p CABG (age 73)  . Kidney disease Sister   . Colon cancer Neg Hx   . Prostate cancer Neg Hx    Social History   Socioeconomic History  . Marital status: Married    Spouse name: Not on file  . Number of children: 2  . Years of education: Not on file  . Highest education level: Not on file  Occupational History  . Occupation: retired Pharmacist, hospital  Tobacco Use  . Smoking status: Former Research scientist (life sciences)  . Smokeless tobacco: Never Used  Vaping Use  . Vaping Use:  Never used  Substance and Sexual Activity  . Alcohol use: Yes    Alcohol/week: 0.0 standard drinks    Comment: occasional  . Drug use: No  . Sexual activity: Not Currently  Other Topics Concern  . Not on file  Social History Narrative  . Not on file   Social Determinants of Health   Financial Resource Strain:   . Difficulty of Paying Living Expenses: Not on file  Food Insecurity:   . Worried About Charity fundraiser in the Last Year: Not on file  . Ran Out of Food in the Last Year: Not on file  Transportation Needs:   . Lack of Transportation (Medical): Not on file  . Lack of Transportation (Non-Medical): Not on file  Physical Activity:   . Days of Exercise per Week: Not on file  . Minutes of Exercise per Session: Not on file  Stress:   . Feeling of Stress : Not on file  Social Connections:   . Frequency of Communication with Friends and Family: Not on file  .  Frequency of Social Gatherings with Friends and Family: Not on file  . Attends Religious Services: Not on file  . Active Member of Clubs or Organizations: Not on file  . Attends Archivist Meetings: Not on file  . Marital Status: Not on file    Outpatient Encounter Medications as of 02/28/2020  Medication Sig  . ALPRAZolam (XANAX) 0.25 MG tablet TAKE 1 TABLET BY MOUTH ONCE DAILY AS NEEDED  . amLODipine (NORVASC) 10 MG tablet TAKE 1 TABLET BY MOUTH ONCE DAILY  . Ascorbic Acid (VITAMIN C) 100 MG tablet Take 100 mg by mouth daily.  Marland Kitchen aspirin 81 MG tablet Take 81 mg by mouth daily.  Marland Kitchen atorvastatin (LIPITOR) 10 MG tablet Take 1 tablet (10 mg total) by mouth daily.  . Azelastine-Fluticasone (DYMISTA NA) Place 2 puffs into the nose daily.  . blood glucose meter kit and supplies Dispense based on patient and insurance preference. Use up to four times daily as directed. (FOR ICD-10 E10.9, E11.9).  Marland Kitchen clopidogrel (PLAVIX) 75 MG tablet TAKE 1 TABLET BY MOUTH ONCE DAILY  . glucose blood (ACCU-CHEK AVIVA PLUS) test strip TEST SUGAR TWICE DAILY  . hydrochlorothiazide (HYDRODIURIL) 50 MG tablet Take 1 tablet (50 mg total) by mouth daily.  Marland Kitchen HYDROcodone-acetaminophen (NORCO) 10-325 MG tablet hydrocodone 10 mg-acetaminophen 325 mg tablet  Take 1 tablet 3 times a day by oral route as needed for pain.  Marland Kitchen levocetirizine (XYZAL) 5 MG tablet Take 5 mg by mouth every evening.  Marland Kitchen lisinopril (ZESTRIL) 40 MG tablet Take 1 tablet (40 mg total) by mouth daily.  . metFORMIN (GLUCOPHAGE) 1000 MG tablet TAKE 1 TABLET BY MOUTH TWICE DAILY WITH MEALS  . metoprolol succinate (TOPROL-XL) 25 MG 24 hr tablet Take 1 tablet (25 mg total) by mouth 2 (two) times daily.  . montelukast (SINGULAIR) 10 MG tablet Take 10 mg by mouth at bedtime.  . Multiple Vitamin (MULTIVITAMIN) tablet Take 1 tablet by mouth daily.  . pantoprazole (PROTONIX) 40 MG tablet TAKE 1 TABLET BY MOUTH ONCE DAILY  . pioglitazone (ACTOS) 15 MG tablet  Take 1 tablet (15 mg total) by mouth daily.  . sertraline (ZOLOFT) 50 MG tablet TAKE 1 TABLET BY MOUTH ONCE DAILY  . tamsulosin (FLOMAX) 0.4 MG CAPS capsule Take 0.4 mg by mouth daily with breakfast.   . testosterone cypionate (DEPOTESTOTERONE CYPIONATE) 200 MG/ML injection Inject 200 mg into the muscle every 14 (fourteen)  days.  . traZODone (DESYREL) 50 MG tablet TAKE 1 and 1/2 TABLETS BY MOUTH AT BEDTIME  . TRULICITY 9.93 ZJ/6.9CV SOPN INJECT 0.$RemoveBefor'75MG'[id number]$  SUBQ ONCE A WEEK  . [DISCONTINUED] atorvastatin (LIPITOR) 10 MG tablet TAKE 1 TABLET BY MOUTH ONCE DAILY  . [DISCONTINUED] hydrochlorothiazide (HYDRODIURIL) 50 MG tablet TAKE 1 TABLET BY MOUTH ONCE DAILY  . [DISCONTINUED] metoprolol succinate (TOPROL-XL) 25 MG 24 hr tablet Take 1 tablet (25 mg total) by mouth 2 (two) times daily.  . [DISCONTINUED] Omega-3 Fatty Acids (FISH OIL) 1200 MG CAPS Take by mouth. 3 daily  . [DISCONTINUED] pioglitazone (ACTOS) 15 MG tablet TAKE 1 TABLET BY MOUTH ONCE DAILY  . [DISCONTINUED] vitamin B-12 (CYANOCOBALAMIN) 100 MCG tablet Take 100 mcg by mouth daily.   No facility-administered encounter medications on file as of 02/28/2020.    Review of Systems  Constitutional: Negative for unexpected weight change.       Some decreased appetite.   HENT: Negative for congestion, sinus pressure and sore throat.   Eyes: Negative for pain and visual disturbance.  Respiratory: Negative for cough, chest tightness and shortness of breath.   Cardiovascular: Negative for chest pain, palpitations and leg swelling.  Gastrointestinal: Positive for constipation. Negative for abdominal pain, diarrhea, nausea and vomiting.  Genitourinary: Negative for difficulty urinating and dysuria.  Musculoskeletal: Positive for back pain. Negative for joint swelling and myalgias.  Skin: Negative for color change and rash.  Neurological: Negative for dizziness, light-headedness and headaches.  Hematological: Negative for adenopathy. Does not  bruise/bleed easily.  Psychiatric/Behavioral: Negative for agitation and dysphoric mood.       Objective:    Physical Exam Vitals reviewed.  Constitutional:      General: He is not in acute distress.    Appearance: Normal appearance. He is well-developed.  HENT:     Head: Normocephalic and atraumatic.  Eyes:     General: No scleral icterus.       Right eye: No discharge.        Left eye: No discharge.     Conjunctiva/sclera: Conjunctivae normal.  Neck:     Thyroid: No thyromegaly.  Cardiovascular:     Rate and Rhythm: Normal rate and regular rhythm.  Pulmonary:     Effort: No respiratory distress.     Breath sounds: Normal breath sounds. No wheezing.  Abdominal:     General: Bowel sounds are normal.     Palpations: Abdomen is soft.     Tenderness: There is no abdominal tenderness.  Musculoskeletal:        General: No swelling or tenderness.     Cervical back: Neck supple. No tenderness.  Lymphadenopathy:     Cervical: No cervical adenopathy.  Skin:    Findings: No erythema or rash.  Neurological:     Mental Status: He is alert and oriented to person, place, and time.  Psychiatric:        Mood and Affect: Mood normal.        Behavior: Behavior normal.     BP 136/68   Pulse 75   Temp 98.3 F (36.8 C) (Oral)   Resp 16   Ht $R'5\' 9"'cc$  (1.753 m)   Wt 201 lb (91.2 kg)   SpO2 98%   BMI 29.68 kg/m  Wt Readings from Last 3 Encounters:  02/28/20 201 lb (91.2 kg)  10/20/19 200 lb (90.7 kg)  07/29/19 205 lb 3.2 oz (93.1 kg)     Lab Results  Component Value Date   WBC 8.5  06/10/2019   HGB 14.7 06/10/2019   HCT 43.8 06/10/2019   PLT 267.0 06/10/2019   GLUCOSE 132 (H) 02/24/2020   CHOL 136 02/24/2020   TRIG 275.0 (H) 02/24/2020   HDL 33.10 (L) 02/24/2020   LDLDIRECT 77.0 02/24/2020   LDLCALC 58 11/21/2013   ALT 20 02/24/2020   AST 21 02/24/2020   NA 139 02/24/2020   K 4.3 02/24/2020   CL 100 02/24/2020   CREATININE 0.89 02/24/2020   BUN 19 02/24/2020   CO2  29 02/24/2020   TSH 0.49 02/24/2020   PSA 0.82 06/10/2019   HGBA1C 7.4 (H) 02/24/2020   MICROALBUR 1.5 10/18/2019       Assessment & Plan:   Problem List Items Addressed This Visit    Obstructive sleep apnea    CPAP.       Intrathoracic goiter    S/p biopsy.  Was evaluated by Dr Harlow Asa.        Relevant Medications   metoprolol succinate (TOPROL-XL) 25 MG 24 hr tablet   Hypertension    Currently on hctz, metoprolol, lisinopril and amlodipine.  Blood pressures as outlined.  Follow pressures.  Follow metabolic panel.       Relevant Medications   atorvastatin (LIPITOR) 10 MG tablet   hydrochlorothiazide (HYDRODIURIL) 50 MG tablet   metoprolol succinate (TOPROL-XL) 25 MG 24 hr tablet   Hypercholesterolemia    On lipitor.  Low cholesterol diet and exercise.  Follow lipid panel and liver function tests.        Relevant Medications   atorvastatin (LIPITOR) 10 MG tablet   hydrochlorothiazide (HYDRODIURIL) 50 MG tablet   metoprolol succinate (TOPROL-XL) 25 MG 24 hr tablet   Other Relevant Orders   Hepatic function panel   Lipid panel   Health care maintenance    Physical today 02/28/20.  Followed by urology for prostate checks.  Colonoscopy 07/20/17 - tubular adenomatous polyps.       GERD (gastroesophageal reflux disease)    Controlled on protonix.  Follow.       Diabetes mellitus ([place])    On trulicity and metformin.  Low carb diet and exercise.  Follow met b and a1c.  a1c 7.4 - increased.  Having issues with decreased appetite.  Hold on increasing trulicity.  Spot check sugars and record.  F/u with Catie.        Relevant Medications   atorvastatin (LIPITOR) 10 MG tablet   pioglitazone (ACTOS) 15 MG tablet   Other Relevant Orders   Hemoglobin X3A   Basic metabolic panel   Back pain    Planning for injection 03/15/20.        Relevant Medications   HYDROcodone-acetaminophen (NORCO) 10-325 MG tablet   Anxiety    On zoloft.  Stable.       Anemia, iron deficiency     Follow cbc.       Relevant Orders   CBC with Differential/Platelet       Einar Pheasant, MD

## 2020-03-05 ENCOUNTER — Encounter: Payer: Self-pay | Admitting: Internal Medicine

## 2020-03-05 NOTE — Assessment & Plan Note (Signed)
On trulicity and metformin.  Low carb diet and exercise.  Follow met b and a1c.  a1c 7.4 - increased.  Having issues with decreased appetite.  Hold on increasing trulicity.  Spot check sugars and record.  F/u with Catie.

## 2020-03-05 NOTE — Assessment & Plan Note (Signed)
On lipitor.  Low cholesterol diet and exercise.  Follow lipid panel and liver function tests.   

## 2020-03-05 NOTE — Assessment & Plan Note (Signed)
Controlled on protonix.  Follow.   

## 2020-03-05 NOTE — Assessment & Plan Note (Signed)
CPAP.  

## 2020-03-05 NOTE — Assessment & Plan Note (Signed)
Planning for injection 03/15/20.

## 2020-03-05 NOTE — Assessment & Plan Note (Signed)
On zoloft.  Stable.  

## 2020-03-05 NOTE — Assessment & Plan Note (Addendum)
Currently on hctz, metoprolol, lisinopril and amlodipine.  Blood pressures as outlined.  Follow pressures.  Follow metabolic panel.

## 2020-03-05 NOTE — Assessment & Plan Note (Signed)
Follow cbc.  

## 2020-03-05 NOTE — Assessment & Plan Note (Signed)
S/p biopsy.  Was evaluated by Dr Harlow Asa.

## 2020-03-15 DIAGNOSIS — M5416 Radiculopathy, lumbar region: Secondary | ICD-10-CM | POA: Diagnosis not present

## 2020-03-15 DIAGNOSIS — Z79891 Long term (current) use of opiate analgesic: Secondary | ICD-10-CM | POA: Diagnosis not present

## 2020-03-30 ENCOUNTER — Other Ambulatory Visit: Payer: Self-pay | Admitting: Internal Medicine

## 2020-04-02 DIAGNOSIS — H1045 Other chronic allergic conjunctivitis: Secondary | ICD-10-CM | POA: Diagnosis not present

## 2020-04-02 DIAGNOSIS — E119 Type 2 diabetes mellitus without complications: Secondary | ICD-10-CM | POA: Diagnosis not present

## 2020-04-02 DIAGNOSIS — H353131 Nonexudative age-related macular degeneration, bilateral, early dry stage: Secondary | ICD-10-CM | POA: Diagnosis not present

## 2020-04-02 DIAGNOSIS — H04129 Dry eye syndrome of unspecified lacrimal gland: Secondary | ICD-10-CM | POA: Diagnosis not present

## 2020-04-02 LAB — HM DIABETES EYE EXAM

## 2020-04-03 DIAGNOSIS — M5416 Radiculopathy, lumbar region: Secondary | ICD-10-CM | POA: Diagnosis not present

## 2020-04-03 DIAGNOSIS — Z79891 Long term (current) use of opiate analgesic: Secondary | ICD-10-CM | POA: Diagnosis not present

## 2020-04-09 DIAGNOSIS — G4733 Obstructive sleep apnea (adult) (pediatric): Secondary | ICD-10-CM | POA: Diagnosis not present

## 2020-04-24 DIAGNOSIS — M5416 Radiculopathy, lumbar region: Secondary | ICD-10-CM | POA: Diagnosis not present

## 2020-05-07 ENCOUNTER — Other Ambulatory Visit: Payer: Self-pay | Admitting: Internal Medicine

## 2020-05-07 DIAGNOSIS — E119 Type 2 diabetes mellitus without complications: Secondary | ICD-10-CM

## 2020-05-08 NOTE — Telephone Encounter (Signed)
rx ok'd for xanax #30 with no refills.   

## 2020-05-21 DIAGNOSIS — N529 Male erectile dysfunction, unspecified: Secondary | ICD-10-CM | POA: Diagnosis not present

## 2020-05-21 DIAGNOSIS — E291 Testicular hypofunction: Secondary | ICD-10-CM | POA: Diagnosis not present

## 2020-05-21 DIAGNOSIS — N400 Enlarged prostate without lower urinary tract symptoms: Secondary | ICD-10-CM | POA: Diagnosis not present

## 2020-05-24 DIAGNOSIS — N138 Other obstructive and reflux uropathy: Secondary | ICD-10-CM | POA: Diagnosis not present

## 2020-05-24 DIAGNOSIS — N401 Enlarged prostate with lower urinary tract symptoms: Secondary | ICD-10-CM | POA: Diagnosis not present

## 2020-05-29 ENCOUNTER — Other Ambulatory Visit: Payer: Medicare PPO

## 2020-06-01 ENCOUNTER — Other Ambulatory Visit: Payer: Medicare PPO

## 2020-06-04 ENCOUNTER — Other Ambulatory Visit (INDEPENDENT_AMBULATORY_CARE_PROVIDER_SITE_OTHER): Payer: Medicare PPO

## 2020-06-04 ENCOUNTER — Other Ambulatory Visit: Payer: Self-pay

## 2020-06-04 DIAGNOSIS — D509 Iron deficiency anemia, unspecified: Secondary | ICD-10-CM | POA: Diagnosis not present

## 2020-06-04 DIAGNOSIS — E1159 Type 2 diabetes mellitus with other circulatory complications: Secondary | ICD-10-CM | POA: Diagnosis not present

## 2020-06-04 DIAGNOSIS — E7800 Pure hypercholesterolemia, unspecified: Secondary | ICD-10-CM

## 2020-06-04 LAB — CBC WITH DIFFERENTIAL/PLATELET
Basophils Absolute: 0.1 10*3/uL (ref 0.0–0.1)
Basophils Relative: 0.7 % (ref 0.0–3.0)
Eosinophils Absolute: 0.1 10*3/uL (ref 0.0–0.7)
Eosinophils Relative: 1.6 % (ref 0.0–5.0)
HCT: 42.9 % (ref 39.0–52.0)
Hemoglobin: 14.6 g/dL (ref 13.0–17.0)
Lymphocytes Relative: 23.3 % (ref 12.0–46.0)
Lymphs Abs: 1.8 10*3/uL (ref 0.7–4.0)
MCHC: 33.9 g/dL (ref 30.0–36.0)
MCV: 86.5 fl (ref 78.0–100.0)
Monocytes Absolute: 0.4 10*3/uL (ref 0.1–1.0)
Monocytes Relative: 5.4 % (ref 3.0–12.0)
Neutro Abs: 5.3 10*3/uL (ref 1.4–7.7)
Neutrophils Relative %: 69 % (ref 43.0–77.0)
Platelets: 250 10*3/uL (ref 150.0–400.0)
RBC: 4.96 Mil/uL (ref 4.22–5.81)
RDW: 14.2 % (ref 11.5–15.5)
WBC: 7.7 10*3/uL (ref 4.0–10.5)

## 2020-06-04 LAB — HEPATIC FUNCTION PANEL
ALT: 20 U/L (ref 0–53)
AST: 21 U/L (ref 0–37)
Albumin: 4.3 g/dL (ref 3.5–5.2)
Alkaline Phosphatase: 43 U/L (ref 39–117)
Bilirubin, Direct: 0.1 mg/dL (ref 0.0–0.3)
Total Bilirubin: 0.8 mg/dL (ref 0.2–1.2)
Total Protein: 6.6 g/dL (ref 6.0–8.3)

## 2020-06-04 LAB — LIPID PANEL
Cholesterol: 145 mg/dL (ref 0–200)
HDL: 37.1 mg/dL — ABNORMAL LOW (ref >39.00)
NonHDL: 107.77
Total CHOL/HDL Ratio: 4
Triglycerides: 309 mg/dL — ABNORMAL HIGH (ref 0.0–149.0)
VLDL: 61.8 mg/dL — ABNORMAL HIGH (ref 0.0–40.0)

## 2020-06-04 LAB — BASIC METABOLIC PANEL
BUN: 12 mg/dL (ref 6–23)
CO2: 29 mEq/L (ref 19–32)
Calcium: 9.6 mg/dL (ref 8.4–10.5)
Chloride: 100 mEq/L (ref 96–112)
Creatinine, Ser: 0.84 mg/dL (ref 0.40–1.50)
GFR: 85.01 mL/min (ref >60.00)
Glucose, Bld: 135 mg/dL — ABNORMAL HIGH (ref 70–99)
Potassium: 4.1 mEq/L (ref 3.5–5.1)
Sodium: 139 mEq/L (ref 135–145)

## 2020-06-04 LAB — LDL CHOLESTEROL, DIRECT: Direct LDL: 76 mg/dL

## 2020-06-04 LAB — HEMOGLOBIN A1C: Hgb A1c MFr Bld: 6.7 % — ABNORMAL HIGH (ref 4.6–6.5)

## 2020-06-05 ENCOUNTER — Encounter: Payer: Self-pay | Admitting: Internal Medicine

## 2020-06-05 ENCOUNTER — Ambulatory Visit: Payer: Medicare PPO | Admitting: Internal Medicine

## 2020-06-05 DIAGNOSIS — I10 Essential (primary) hypertension: Secondary | ICD-10-CM

## 2020-06-05 DIAGNOSIS — G4733 Obstructive sleep apnea (adult) (pediatric): Secondary | ICD-10-CM | POA: Diagnosis not present

## 2020-06-05 DIAGNOSIS — K219 Gastro-esophageal reflux disease without esophagitis: Secondary | ICD-10-CM

## 2020-06-05 DIAGNOSIS — E1159 Type 2 diabetes mellitus with other circulatory complications: Secondary | ICD-10-CM | POA: Diagnosis not present

## 2020-06-05 DIAGNOSIS — E7800 Pure hypercholesterolemia, unspecified: Secondary | ICD-10-CM

## 2020-06-05 DIAGNOSIS — F419 Anxiety disorder, unspecified: Secondary | ICD-10-CM

## 2020-06-05 DIAGNOSIS — D509 Iron deficiency anemia, unspecified: Secondary | ICD-10-CM

## 2020-06-05 DIAGNOSIS — M545 Low back pain, unspecified: Secondary | ICD-10-CM

## 2020-06-05 LAB — HM DIABETES FOOT EXAM

## 2020-06-05 MED ORDER — PIOGLITAZONE HCL 15 MG PO TABS: 15.0000 mg | ORAL_TABLET | Freq: Every day | ORAL | 1 refills | Status: DC

## 2020-06-05 MED ORDER — TRAZODONE HCL 50 MG PO TABS: 75.0000 mg | ORAL_TABLET | Freq: Every day | ORAL | 1 refills | Status: DC

## 2020-06-05 MED ORDER — MONTELUKAST SODIUM 10 MG PO TABS: 10.0000 mg | ORAL_TABLET | Freq: Every day | ORAL | 2 refills | Status: DC

## 2020-06-05 MED ORDER — LEVOCETIRIZINE DIHYDROCHLORIDE 5 MG PO TABS: 5.0000 mg | ORAL_TABLET | Freq: Every evening | ORAL | 2 refills | Status: DC

## 2020-06-05 MED ORDER — ALPRAZOLAM 0.25 MG PO TABS: 0.2500 mg | ORAL_TABLET | Freq: Every day | ORAL | 0 refills | Status: DC | PRN

## 2020-06-05 NOTE — Progress Notes (Signed)
Patient ID: Ricardo Nash, male   DOB: Sep 18, 1945, 75 y.o.   MRN: [id number]   Subjective:    Patient ID: Ricardo Nash, male    DOB: [birth year], 75 y.o.   MRN: [id number]  HPI This visit occurred during the SARS-CoV-2 public health emergency.  Safety protocols were in place, including screening questions prior to the visit, additional usage of staff PPE, and extensive cleaning of exam room while observing appropriate contact time as indicated for disinfecting solutions.  Patient here for a scheduled follow up.  Here to follow up regarding his blood pressure, blood sugar and cholesterol.  Back is doing better.  S/p injection.  Doing better.  Blood sugars improved.  a1c 6.7.  AM sugars remain a little elevated.  Stays active.  No chest pain or sob.  No acid reflux reported.  No abdominal pain.  Bowels moving.  Still runny nose, but no acute sinus issues.  Doing well on xyzal and singulair.  Also using flonase/astelin.  On flomax bid.  Urinating ok. Flow stronger.    Past Medical History:  Diagnosis Date  . C. difficile colitis   . Coronary artery disease   . Depression   . Diabetes mellitus ([place])   . Diverticulitis   . Diverticulosis   . Environmental allergies   . GERD (gastroesophageal reflux disease)   . Goiter    intrathoracic, s/p benign biopsy (Dr Harlow Asa)  . Hypercholesterolemia   . Hypertension   . Osteoarthritis    cervical spine, lumbar spine  . Paroxysmal atrial fibrillation ([facility])   . PONV (postoperative nausea and vomiting)   . Sleep apnea   . Stroke ([place])   . TIA (transient ischemic attack)    Past Surgical History:  Procedure Laterality Date  . BACK SURGERY  8/08   s/p fusion of L4-L5  . capsule endoscopy    . CATARACT EXTRACTION  2011   Dr. Herbert Deaner  . [place] SURGERY  2002  . COLONOSCOPY WITH PROPOFOL N/A 07/20/2017   Procedure: COLONOSCOPY WITH PROPOFOL;  Surgeon: Manya Silvas, MD;  Location: [facility] ENDOSCOPY;  Service: Endoscopy;  Laterality:  N/A;  . EYE SURGERY     cataract bilateral 10/1999  . eyelid reduction     Dr. Carlis Abbott  . INGUINAL HERNIA REPAIR  1991   Dr, Tamala Julian  . NOSE SURGERY     turbinate reduction  . SEPTOPLASTY  1975  . SHOULDER SURGERY  2000   rotator cuff   Family History  Problem Relation Age of Onset  . Congestive Heart Failure Father   . Heart disease Father        myocardial infarction  . Rheumatic fever Father        valvular disease  . Heart disease Mother        s/p CABG (age 36)  . Kidney disease Sister   . Colon cancer Neg Hx   . Prostate cancer Neg Hx    Social History   Socioeconomic History  . Marital status: Married    Spouse name: Not on file  . Number of children: 2  . Years of education: Not on file  . Highest education level: Not on file  Occupational History  . Occupation: retired Pharmacist, hospital  Tobacco Use  . Smoking status: Former Research scientist (life sciences)  . Smokeless tobacco: Never Used  Vaping Use  . Vaping Use: Never used  Substance and Sexual Activity  . Alcohol use: Yes    Alcohol/week: 0.0 standard drinks  Comment: occasional  . Drug use: No  . Sexual activity: Not Currently  Other Topics Concern  . Not on file  Social History Narrative  . Not on file   Social Determinants of Health   Financial Resource Strain: Not on file  Food Insecurity: Not on file  Transportation Needs: Not on file  Physical Activity: Not on file  Stress: Not on file  Social Connections: Not on file    Outpatient Encounter Medications as of 06/05/2020  Medication Sig  . amLODipine (NORVASC) 10 MG tablet TAKE 1 TABLET BY MOUTH ONCE DAILY  . Ascorbic Acid (VITAMIN C) 100 MG tablet Take 100 mg by mouth daily.  Marland Kitchen aspirin 81 MG tablet Take 81 mg by mouth daily.  Marland Kitchen atorvastatin (LIPITOR) 10 MG tablet Take 1 tablet (10 mg total) by mouth daily.  . Azelastine-Fluticasone (DYMISTA NA) Place 2 puffs into the nose daily.  . blood glucose meter kit and supplies Dispense based on patient and insurance  preference. Use up to four times daily as directed. (FOR ICD-10 E10.9, E11.9).  Marland Kitchen glucose blood (ACCU-CHEK AVIVA PLUS) test strip TEST SUGAR TWICE DAILY  . hydrochlorothiazide (HYDRODIURIL) 50 MG tablet Take 1 tablet (50 mg total) by mouth daily.  Marland Kitchen HYDROcodone-acetaminophen (NORCO) 10-325 MG tablet hydrocodone 10 mg-acetaminophen 325 mg tablet  Take 1 tablet 3 times a day by oral route as needed for pain.  Marland Kitchen lisinopril (ZESTRIL) 40 MG tablet TAKE 1 TABLET BY MOUTH ONCE DAILY  . metFORMIN (GLUCOPHAGE) 1000 MG tablet TAKE 1 TABLET BY MOUTH TWICE DAILY WITH MEALS  . metoprolol succinate (TOPROL-XL) 25 MG 24 hr tablet Take 1 tablet (25 mg total) by mouth 2 (two) times daily.  . Multiple Vitamin (MULTIVITAMIN) tablet Take 1 tablet by mouth daily.  . pantoprazole (PROTONIX) 40 MG tablet TAKE 1 TABLET BY MOUTH ONCE DAILY  . sertraline (ZOLOFT) 50 MG tablet TAKE 1 TABLET BY MOUTH ONCE DAILY  . tamsulosin (FLOMAX) 0.4 MG CAPS capsule Take 0.4 mg by mouth daily with breakfast.   . testosterone cypionate (DEPOTESTOTERONE CYPIONATE) 200 MG/ML injection Inject 200 mg into the muscle every 14 (fourteen) days.  . TRULICITY 3.41 PF/7.9KW SOPN INJECT 0.75MG SUBQ ONCE A WEEK  . [DISCONTINUED] ALPRAZolam (XANAX) 0.25 MG tablet TAKE 1 TABLET BY MOUTH ONCE DAILY AS NEEDED  . [DISCONTINUED] levocetirizine (XYZAL) 5 MG tablet Take 5 mg by mouth every evening.  . [DISCONTINUED] montelukast (SINGULAIR) 10 MG tablet Take 10 mg by mouth at bedtime.  . [DISCONTINUED] pioglitazone (ACTOS) 15 MG tablet Take 1 tablet (15 mg total) by mouth daily.  . [DISCONTINUED] traZODone (DESYREL) 50 MG tablet TAKE 1 and 1/2 TABLETS BY MOUTH AT BEDTIME  . ALPRAZolam (XANAX) 0.25 MG tablet Take 1 tablet (0.25 mg total) by mouth daily as needed.  Marland Kitchen levocetirizine (XYZAL) 5 MG tablet Take 1 tablet (5 mg total) by mouth every evening.  . montelukast (SINGULAIR) 10 MG tablet Take 1 tablet (10 mg total) by mouth at bedtime.  . pioglitazone  (ACTOS) 15 MG tablet Take 1 tablet (15 mg total) by mouth daily.  . traZODone (DESYREL) 50 MG tablet Take 1.5 tablets (75 mg total) by mouth at bedtime.  . [DISCONTINUED] clopidogrel (PLAVIX) 75 MG tablet TAKE 1 TABLET BY MOUTH ONCE DAILY   No facility-administered encounter medications on file as of 06/05/2020.    Review of Systems  Constitutional: Negative for appetite change and unexpected weight change.  HENT: Negative for congestion and sinus pressure.   Respiratory:  Negative for cough, chest tightness and shortness of breath.   Cardiovascular: Negative for chest pain, palpitations and leg swelling.  Gastrointestinal: Negative for abdominal pain, diarrhea, nausea and vomiting.  Genitourinary: Negative for difficulty urinating and dysuria.  Musculoskeletal: Negative for joint swelling and myalgias.       Back pain is better.   Skin: Negative for color change and rash.  Neurological: Negative for dizziness, light-headedness and headaches.  Psychiatric/Behavioral: Negative for agitation and dysphoric mood.       Objective:    Physical Exam Vitals reviewed.  Constitutional:      General: He is not in acute distress.    Appearance: Normal appearance. He is well-developed and well-nourished.  HENT:     Head: Normocephalic and atraumatic.     Right Ear: External ear normal.     Left Ear: External ear normal.  Eyes:     General: No scleral icterus.       Right eye: No discharge.        Left eye: No discharge.     Conjunctiva/sclera: Conjunctivae normal.  Cardiovascular:     Rate and Rhythm: Normal rate and regular rhythm.  Pulmonary:     Effort: Pulmonary effort is normal. No respiratory distress.     Breath sounds: Normal breath sounds.  Abdominal:     General: Bowel sounds are normal.     Palpations: Abdomen is soft.     Tenderness: There is no abdominal tenderness.  Musculoskeletal:        General: No swelling, tenderness or edema.     Cervical back: Neck supple. No  tenderness.  Lymphadenopathy:     Cervical: No cervical adenopathy.  Skin:    Findings: No erythema or rash.  Neurological:     Mental Status: He is alert.  Psychiatric:        Mood and Affect: Mood and affect and mood normal.        Behavior: Behavior normal.     BP 136/70   Pulse 71   Temp 97.9 F (36.6 C) (Oral)   Resp 16   Ht '5\' 9"'  (1.753 m)   Wt 206 lb (93.4 kg)   SpO2 98%   BMI 30.42 kg/m  Wt Readings from Last 3 Encounters:  06/05/20 206 lb (93.4 kg)  02/28/20 201 lb (91.2 kg)  10/20/19 200 lb (90.7 kg)     Lab Results  Component Value Date   WBC 7.7 06/04/2020   HGB 14.6 06/04/2020   HCT 42.9 06/04/2020   PLT 250.0 06/04/2020   GLUCOSE 135 (H) 06/04/2020   CHOL 145 06/04/2020   TRIG 309.0 (H) 06/04/2020   HDL 37.10 (L) 06/04/2020   LDLDIRECT 76.0 06/04/2020   LDLCALC 58 11/21/2013   ALT 20 06/04/2020   AST 21 06/04/2020   NA 139 06/04/2020   K 4.1 06/04/2020   CL 100 06/04/2020   CREATININE 0.84 06/04/2020   BUN 12 06/04/2020   CO2 29 06/04/2020   TSH 0.49 02/24/2020   PSA 0.82 06/10/2019   HGBA1C 6.7 (H) 06/04/2020   MICROALBUR 1.5 10/18/2019       Assessment & Plan:   Problem List Items Addressed This Visit    Anemia, iron deficiency    Follow cbc.       Anxiety    Stable.  On zoloft.  Uses xanax prn.  Follow.       Relevant Medications   ALPRAZolam (XANAX) 0.25 MG tablet   traZODone (DESYREL) 50 MG  tablet   Back pain    Saw ortho.  S/p injection.  Back doing better.  Follow.       Diabetes mellitus ([place])    On trulicity and metformin.  Low carb diet and exercise.  a1c improved.  Monitoring diet.  Follow met b and a1c.   Lab Results  Component Value Date   HGBA1C 6.7 (H) 06/04/2020        Relevant Medications   pioglitazone (ACTOS) 15 MG tablet   Other Relevant Orders   Hemoglobin E1E   Basic metabolic panel   GERD (gastroesophageal reflux disease)    No upper symptoms reported.  On protonix.        Hypercholesterolemia    On lipitor.  Low cholesterol diet and exercise.  Follow lipid panel and liver function tests.        Relevant Orders   Hepatic function panel   Lipid panel   Hypertension    Continue hctz, metoprolol, lisinopril and amlodipine.  Blood pressure as outlined.  No changes in medication.  Follow pressures.  Follow metabolic panel.       Obstructive sleep apnea    CPAP.           Einar Pheasant, MD

## 2020-06-06 DIAGNOSIS — E291 Testicular hypofunction: Secondary | ICD-10-CM | POA: Diagnosis not present

## 2020-06-11 ENCOUNTER — Encounter: Payer: Self-pay | Admitting: Internal Medicine

## 2020-06-11 NOTE — Assessment & Plan Note (Signed)
Continue hctz, metoprolol, lisinopril and amlodipine.  Blood pressure as outlined.  No changes in medication.  Follow pressures.  Follow metabolic panel.

## 2020-06-11 NOTE — Assessment & Plan Note (Signed)
On trulicity and metformin.  Low carb diet and exercise.  a1c improved.  Monitoring diet.  Follow met b and a1c.   Lab Results  Component Value Date   HGBA1C 6.7 (H) 06/04/2020

## 2020-06-11 NOTE — Assessment & Plan Note (Signed)
On lipitor.  Low cholesterol diet and exercise.  Follow lipid panel and liver function tests.   

## 2020-06-11 NOTE — Assessment & Plan Note (Signed)
Saw ortho.  S/p injection.  Back doing better.  Follow.

## 2020-06-11 NOTE — Assessment & Plan Note (Signed)
CPAP.  

## 2020-06-11 NOTE — Assessment & Plan Note (Signed)
Stable.  On zoloft.  Uses xanax prn.  Follow.

## 2020-06-11 NOTE — Assessment & Plan Note (Signed)
Follow cbc.  

## 2020-06-11 NOTE — Assessment & Plan Note (Signed)
No upper symptoms reported.  On protonix.   

## 2020-06-20 ENCOUNTER — Ambulatory Visit (INDEPENDENT_AMBULATORY_CARE_PROVIDER_SITE_OTHER): Payer: Medicare PPO

## 2020-06-20 VITALS — Ht 69.0 in | Wt 196.0 lb

## 2020-06-20 DIAGNOSIS — Z0000 Encounter for general adult medical examination without abnormal findings: Secondary | ICD-10-CM | POA: Diagnosis not present

## 2020-06-20 NOTE — Progress Notes (Signed)
Subjective:   Ricardo Nash is a 75 y.o. male who presents for Medicare Annual/Subsequent preventive examination.  Review of Systems    No ROS.  Medicare Wellness Virtual Visit.    Cardiac Risk Factors include: advanced age (>10mn, >>32women);male gender;hypertension;diabetes mellitus     Objective:    Today's Vitals   06/20/20 1041  Weight: 196 lb (88.9 kg)  Height: '5\' 9"'  (1.753 m)   Body mass index is 28.94 kg/m.  Advanced Directives 06/20/2020 06/20/2019 06/16/2018 07/20/2017 06/08/2017 09/08/2016 09/06/2016  Does Patient Have a Medical Advance Directive? Yes Yes Yes Yes Yes No No  Type of AParamedicof [place]ow[place]Living will [place]Living will [place]Living will [place]Living will HS[place]Living will - -  Does patient want to make changes to medical advance directive? No - Patient declined No - Patient declined No - Patient declined - No - Patient declined - -  Copy of [place]in Chart? Yes - validated most recent copy scanned in chart (See row information) Yes - validated most recent copy scanned in chart (See row information) Yes - validated most recent copy scanned in chart (See row information) No - copy requested Yes - -  Would patient like information on creating a medical advance directive? - - - - - No - Patient declined No - Patient declined    Current Medications (verified) Outpatient Encounter Medications as of 06/20/2020  Medication Sig  . ALPRAZolam (XANAX) 0.25 MG tablet Take 1 tablet (0.25 mg total) by mouth daily as needed.  .Marland KitchenamLODipine (NORVASC) 10 MG tablet TAKE 1 TABLET BY MOUTH ONCE DAILY  . Ascorbic Acid (VITAMIN C) 100 MG tablet Take 100 mg by mouth daily.  .Marland Kitchenaspirin 81 MG tablet Take 81 mg by mouth daily.  .Marland Kitchenatorvastatin (LIPITOR) 10 MG tablet Take 1 tablet (10 mg total) by mouth daily.  . Azelastine-Fluticasone (DYMISTA NA)  Place 2 puffs into the nose daily.  . blood glucose meter kit and supplies Dispense based on patient and insurance preference. Use up to four times daily as directed. (FOR ICD-10 E10.9, E11.9).  .Marland Kitchenglucose blood (ACCU-CHEK AVIVA PLUS) test strip TEST SUGAR TWICE DAILY  . hydrochlorothiazide (HYDRODIURIL) 50 MG tablet Take 1 tablet (50 mg total) by mouth daily.  .Marland KitchenHYDROcodone-acetaminophen (NORCO) 10-325 MG tablet hydrocodone 10 mg-acetaminophen 325 mg tablet  Take 1 tablet 3 times a day by oral route as needed for pain.  .Marland Kitchenlevocetirizine (XYZAL) 5 MG tablet Take 1 tablet (5 mg total) by mouth every evening.  .Marland Kitchenlisinopril (ZESTRIL) 40 MG tablet TAKE 1 TABLET BY MOUTH ONCE DAILY  . metFORMIN (GLUCOPHAGE) 1000 MG tablet TAKE 1 TABLET BY MOUTH TWICE DAILY WITH MEALS  . metoprolol succinate (TOPROL-XL) 25 MG 24 hr tablet Take 1 tablet (25 mg total) by mouth 2 (two) times daily.  . montelukast (SINGULAIR) 10 MG tablet Take 1 tablet (10 mg total) by mouth at bedtime.  . Multiple Vitamin (MULTIVITAMIN) tablet Take 1 tablet by mouth daily.  . pantoprazole (PROTONIX) 40 MG tablet TAKE 1 TABLET BY MOUTH ONCE DAILY  . pioglitazone (ACTOS) 15 MG tablet Take 1 tablet (15 mg total) by mouth daily.  . sertraline (ZOLOFT) 50 MG tablet TAKE 1 TABLET BY MOUTH ONCE DAILY  . tamsulosin (FLOMAX) 0.4 MG CAPS capsule Take 0.4 mg by mouth daily with breakfast.   . testosterone cypionate (DEPOTESTOTERONE CYPIONATE) 200 MG/ML injection Inject  200 mg into the muscle every 14 (fourteen) days.  . traZODone (DESYREL) 50 MG tablet Take 1.5 tablets (75 mg total) by mouth at bedtime.  . TRULICITY 6.19 JK/9.3OI SOPN INJECT 0.75MG SUBQ ONCE A WEEK   No facility-administered encounter medications on file as of 06/20/2020.    Allergies (verified) Penicillins   History: Past Medical History:  Diagnosis Date  . C. difficile colitis   . Coronary artery disease   . Depression   . Diabetes mellitus ([place])   . Diverticulitis   .  Diverticulosis   . Environmental allergies   . GERD (gastroesophageal reflux disease)   . Goiter    intrathoracic, s/p benign biopsy (Dr Harlow Asa)  . Hypercholesterolemia   . Hypertension   . Osteoarthritis    cervical spine, lumbar spine  . Paroxysmal atrial fibrillation ([facility])   . PONV (postoperative nausea and vomiting)   . Sleep apnea   . Stroke ([place])   . TIA (transient ischemic attack)    Past Surgical History:  Procedure Laterality Date  . BACK SURGERY  8/08   s/p fusion of L4-L5  . capsule endoscopy    . CATARACT EXTRACTION  2011   Dr. Herbert Deaner  . [place] SURGERY  2002  . COLONOSCOPY WITH PROPOFOL N/A 07/20/2017   Procedure: COLONOSCOPY WITH PROPOFOL;  Surgeon: Manya Silvas, MD;  Location: [facility] ENDOSCOPY;  Service: Endoscopy;  Laterality: N/A;  . EYE SURGERY     cataract bilateral 10/1999  . eyelid reduction     Dr. Carlis Abbott  . INGUINAL HERNIA REPAIR  1991   Dr, Tamala Julian  . NOSE SURGERY     turbinate reduction  . SEPTOPLASTY  1975  . SHOULDER SURGERY  2000   rotator cuff   Family History  Problem Relation Age of Onset  . Congestive Heart Failure Father   . Heart disease Father        myocardial infarction  . Rheumatic fever Father        valvular disease  . Heart disease Mother        s/p CABG (age 79)  . Kidney disease Sister   . Colon cancer Neg Hx   . Prostate cancer Neg Hx    Social History   Socioeconomic History  . Marital status: Married    Spouse name: Not on file  . Number of children: 2  . Years of education: Not on file  . Highest education level: Not on file  Occupational History  . Occupation: retired Pharmacist, hospital  Tobacco Use  . Smoking status: Former Research scientist (life sciences)  . Smokeless tobacco: Never Used  Vaping Use  . Vaping Use: Never used  Substance and Sexual Activity  . Alcohol use: Yes    Alcohol/week: 0.0 standard drinks    Comment: occasional  . Drug use: No  . Sexual activity: Not Currently  Other Topics Concern  . Not on file  Social  History Narrative  . Not on file   Social Determinants of Health   Financial Resource Strain: Low Risk   . Difficulty of Paying Living Expenses: Not hard at all  Food Insecurity: No Food Insecurity  . Worried About Charity fundraiser in the Last Year: Never true  . Ran Out of Food in the Last Year: Never true  Transportation Needs: No Transportation Needs  . Lack of Transportation (Medical): No  . Lack of Transportation (Non-Medical): No  Physical Activity: Sufficiently Active  . Days of Exercise per Week: 5 days  .  Minutes of Exercise per Session: 60 min  Stress: No Stress Concern Present  . Feeling of Stress : Not at all  Social Connections: Unknown  . Frequency of Communication with Friends and Family: Not on file  . Frequency of Social Gatherings with Friends and Family: Not on file  . Attends Religious Services: Not on file  . Active Member of Clubs or Organizations: Not on file  . Attends Archivist Meetings: Not on file  . Marital Status: Married    Tobacco Counseling Counseling given: Not Answered   Clinical Intake:  Pre-visit preparation completed: Yes        Diabetes: Yes (Followed by pcp)  How often do you need to have someone help you when you read instructions, pamphlets, or other written materials from your doctor or pharmacy?: 1 - Never  Nutrition Risk Assessment: Has the patient had any N/V/D within the last 2 weeks?  No  Does the patient have any non-healing wounds?  No  Has the patient had any unintentional weight loss or weight gain?  No   Diabetes: If diabetic, was a CBG obtained today?  Yes ,FBS 144 Did the patient bring in their glucometer from home?  No  How often do you monitor your CBG's? Twice daily.   Financial Strains and Diabetes Management: Are you having any financial strains with the device, your supplies or your medication? No .  Does the patient want to be seen by Chronic Care Management for management of their  diabetes?  No  Would the patient like to be referred to a Nutritionist or for Diabetic Management?  No  Interpreter Needed?: No      Activities of Daily Living In your present state of health, do you have any difficulty performing the following activities: 06/20/2020  Hearing? N  Vision? N  Difficulty concentrating or making decisions? N  Walking or climbing stairs? Y  Comment Unsteady gait. Cane in use. Chronic hx of back pain with leg weakness.  Dressing or bathing? N  Doing errands, shopping? N  Preparing Food and eating ? N  Using the Toilet? N  In the past six months, have you accidently leaked urine? N  Do you have problems with loss of bowel control? N  Managing your Medications? N  Managing your Finances? N  Housekeeping or managing your Housekeeping? N  Some recent data might be hidden    Patient Care Team: Einar Pheasant, MD as PCP - General (Internal Medicine)  Indicate any recent Medical Services you may have received from other than [facility] providers in the past year (date may be approximate).     Assessment:   This is a routine wellness examination for [place].  I connected with Jaman today by telephone and verified that I am speaking with the correct person using two identifiers. Location patient: home Location provider: work Persons participating in the virtual visit: patient, Marine scientist.    I discussed the limitations, risks, security and privacy concerns of performing an evaluation and management service by telephone and the availability of in person appointments. The patient expressed understanding and verbally consented to this telephonic visit.    Interactive audio and video telecommunications were attempted between this provider and patient, however failed, due to patient having technical difficulties OR patient did not have access to video capability.  We continued and completed visit with audio only.  Some vital signs may be absent or patient reported.    Hearing/Vision screen  Hearing Screening   '125Hz'   '250Hz'  '500Hz'  '1000Hz'  '2000Hz'  '3000Hz'  '4000Hz'  '6000Hz'  '8000Hz'   Right ear:           Left ear:           Comments: Patient is able to hear conversational tones without difficulty.  No issues reported.  Vision Screening Comments: Followed by Dr. Sondra Barges  Wears corrective lenses  Cataract extraction, bilateral  Visual acuity not assessed per patient preference since they have regular follow up with the ophthalmologist  Dietary issues and exercise activities discussed: Current Exercise Habits: Home exercise routine, Type of exercise: walking;yoga;stretching;strength training/weights, Time (Minutes): 30, Frequency (Times/Week): 5, Weekly Exercise (Minutes/Week): 150, Intensity: Mild  Healthy diet Good water intake  Goals    . Follow up with Primary Care Provider     As needed      Depression Screen PHQ 2/9 Scores 06/20/2020 06/20/2019 06/16/2018 06/08/2017 06/05/2016 09/07/2015 09/06/2014  PHQ - 2 Score 0 0 0 1 0 2 1  PHQ- 9 Score - - - - - 8 -    Fall Risk Fall Risk  06/20/2020 06/20/2019 06/16/2018 06/08/2017 06/05/2016  Falls in the past year? 1 0 0 No Yes  Number falls in past yr: 0 - - - 2 or more  Injury with Fall? 0 - - - No  Risk for fall due to : Impaired balance/gait - - - -  Follow up Falls evaluation completed Falls evaluation completed - - Falls prevention discussed;Education provided    FALL RISK PREVENTION PERTAINING TO THE HOME: Handrails in use when climbing stairs? Yes Home free of loose throw rugs in walkways, pet beds, electrical cords, etc? Yes  Adequate lighting in your home to reduce risk of falls? Yes   ASSISTIVE DEVICES UTILIZED TO PREVENT FALLS: Use of a cane, walker or w/c? Yes   Grab bars in the bathroom? Yes  Shower chair or bench in shower? Yes  Elevated toilet seat or a handicapped toilet? No   TIMED UP AND GO: Was the test performed? No . Virtual visit.   Cognitive Function: MMSE - Mini Mental State Exam  06/05/2016  Orientation to time 5  Orientation to Place 5  Registration 3  Attention/ Calculation 5  Recall 3  Language- name 2 objects 2  Language- repeat 1  Language- follow 3 step command 3  Language- read & follow direction 1  Write a sentence 1  Copy design 1  Total score 30     6CIT Screen 06/20/2019 06/16/2018 06/08/2017  What Year? 0 points 0 points 0 points  What month? 0 points 0 points 0 points  What time? 0 points 0 points 0 points  Count back from 20 - 0 points 0 points  Months in reverse 0 points 0 points 0 points  Repeat phrase - 0 points 0 points  Total Score - 0 0    Immunizations Immunization History  Administered Date(s) Administered  . Fluad Quad(high Dose 65+) 02/17/2020  . Influenza Split 02/16/2012, 02/13/2014  . Influenza, High Dose Seasonal PF 02/20/2016, 02/05/2017, 02/09/2018, 02/07/2019  . Influenza,inj,Quad PF,6+ Mos 01/14/2013, 03/08/2015  . PFIZER(Purple Top)SARS-COV-2 Vaccination 06/20/2019, 07/11/2019, 02/15/2020  . Pneumococcal Conjugate-13 03/10/2013  . Pneumococcal Polysaccharide-23 08/23/2014  . Tdap 05/13/2011  . Zoster 12/08/2013  . Zoster Recombinat (Shingrix) 04/24/2017, 07/15/2017   Health Maintenance There are no preventive care reminders to display for this patient. Health Maintenance  Topic Date Due  . HEMOGLOBIN A1C  12/02/2020  . OPHTHALMOLOGY EXAM  04/02/2021  . TETANUS/TDAP  05/12/2021  .  FOOT EXAM  06/05/2021  . INFLUENZA VACCINE  Completed  . COVID-19 Vaccine  Completed  . Hepatitis C Screening  Completed  . PNA vac Low Risk Adult  Completed   Colorectal cancer screening: Type of screening: Colonoscopy. Completed 07/20/17. Repeat every 5 years  Lung Cancer Screening: (Low Dose CT Chest recommended if Age 61-80 years, 30 pack-year currently smoking OR have quit w/in 15years.) does not qualify.   Hepatitis C Screening: Completed 10/02/15.   Vision Screening: Recommended annual ophthalmology exams for early detection of  glaucoma and other disorders of the eye. Is the patient up to date with their annual eye exam?  Yes   Dental Screening: Recommended annual dental exams for proper oral hygiene.  Community Resource Referral / Chronic Care Management: CRR required this visit?  No   CCM required this visit?  No      Plan:   Keep all routine maintenance appointments.   Next scheduled lab 10/02/20 @ 8:00  Follow up 10/04/20 @ 8:30  I have personally reviewed and noted the following in the patient's chart:   . Medical and social history . Use of alcohol, tobacco or illicit drugs  . Current medications and supplements . Functional ability and status . Nutritional status . Physical activity . Advanced directives . List of other physicians . Hospitalizations, surgeries, and ER visits in previous 12 months . Vitals . Screenings to include cognitive, depression, and falls . Referrals and appointments  In addition, I have reviewed and discussed with patient certain preventive protocols, quality metrics, and best practice recommendations. A written personalized care plan for preventive services as well as general preventive health recommendations were provided to patient via mychart.     Varney Biles, LPN   01/11/9573

## 2020-06-20 NOTE — Patient Instructions (Addendum)
Ricardo Nash , Thank you for taking time to come for your Medicare Wellness Visit. I appreciate your ongoing commitment to your health goals. Please review the following plan we discussed and let me know if I can assist you in the future.   These are the goals we discussed: Goals    . Follow up with Primary Care Provider     As needed       This is a list of the screening recommended for you and due dates:  Health Maintenance  Topic Date Due  . Hemoglobin A1C  12/02/2020  . Eye exam for diabetics  04/02/2021  . Tetanus Vaccine  05/12/2021  . Complete foot exam   06/05/2021  . Flu Shot  Completed  . COVID-19 Vaccine  Completed  .  Hepatitis C: One time screening is recommended by Center for Disease Control  (CDC) for  adults born from [birth year] through 1966.   Completed  . Pneumonia vaccines  Completed    Immunizations Immunization History  Administered Date(s) Administered  . Fluad Quad(high Dose 65+) 02/17/2020  . Influenza Split 02/16/2012, 02/13/2014  . Influenza, High Dose Seasonal PF 02/20/2016, 02/05/2017, 02/09/2018, 02/07/2019  . Influenza,inj,Quad PF,6+ Mos 01/14/2013, 03/08/2015  . PFIZER(Purple Top)SARS-COV-2 Vaccination 06/20/2019, 07/11/2019, 02/15/2020  . Pneumococcal Conjugate-13 03/10/2013  . Pneumococcal Polysaccharide-23 08/23/2014  . Tdap 05/13/2011  . Zoster 12/08/2013  . Zoster Recombinat (Shingrix) 04/24/2017, 07/15/2017   Keep all routine maintenance appointments.   Next scheduled lab 10/02/20 @ 8:00  Follow up 10/04/20 @ 8:30  Advanced directives: on file  Conditions/risks identified: none new  Follow up in one year for your annual wellness visit.   Preventive Care 76 Years and Older, Male Preventive care refers to lifestyle choices and visits with your health care provider that can promote health and wellness. What does preventive care include?  A yearly physical exam. This is also called an annual well check.  Dental exams once or twice a  year.  Routine eye exams. Ask your health care provider how often you should have your eyes checked.  Personal lifestyle choices, including:  Daily care of your teeth and gums.  Regular physical activity.  Eating a healthy diet.  Avoiding tobacco and drug use.  Limiting alcohol use.  Practicing safe sex.  Taking low doses of aspirin every day.  Taking vitamin and mineral supplements as recommended by your health care provider. What happens during an annual well check? The services and screenings done by your health care provider during your annual well check will depend on your age, overall health, lifestyle risk factors, and family history of disease. Counseling  Your health care provider may ask you questions about your:  Alcohol use.  Tobacco use.  Drug use.  Emotional well-being.  Home and relationship well-being.  Sexual activity.  Eating habits.  History of falls.  Memory and ability to understand (cognition).  Work and work Statistician. Screening  You may have the following tests or measurements:  Height, weight, and BMI.  Blood pressure.  Lipid and cholesterol levels. These may be checked every 5 years, or more frequently if you are over 28 years old.  Skin check.  Lung cancer screening. You may have this screening every year starting at age 29 if you have a 30-pack-year history of smoking and currently smoke or have quit within the past 15 years.  Fecal occult blood test (FOBT) of the stool. You may have this test every year starting at age 34.  Flexible  sigmoidoscopy or colonoscopy. You may have a sigmoidoscopy every 5 years or a colonoscopy every 10 years starting at age 76.  Prostate cancer screening. Recommendations will vary depending on your family history and other risks.  Hepatitis C blood test.  Hepatitis B blood test.  Sexually transmitted disease (STD) testing.  Diabetes screening. This is done by checking your blood sugar  (glucose) after you have not eaten for a while (fasting). You may have this done every 1-3 years.  Abdominal aortic aneurysm (AAA) screening. You may need this if you are a current or former smoker.  Osteoporosis. You may be screened starting at age 76 if you are at high risk. Talk with your health care provider about your test results, treatment options, and if necessary, the need for more tests. Vaccines  Your health care provider may recommend certain vaccines, such as:  Influenza vaccine. This is recommended every year.  Tetanus, diphtheria, and acellular pertussis (Tdap, Td) vaccine. You may need a Td booster every 10 years.  Zoster vaccine. You may need this after age 34.  Pneumococcal 13-valent conjugate (PCV13) vaccine. One dose is recommended after age 26.  Pneumococcal polysaccharide (PPSV23) vaccine. One dose is recommended after age 76. Talk to your health care provider about which screenings and vaccines you need and how often you need them. This information is not intended to replace advice given to you by your health care provider. Make sure you discuss any questions you have with your health care provider. Document Released: 05/25/2015 Document Revised: 01/16/2016 Document Reviewed: 02/27/2015 Elsevier Interactive Patient Education  2017 [place] Prevention in the Home Falls can cause injuries. They can happen to people of all ages. There are many things you can do to make your home safe and to help prevent falls. What can I do on the outside of my home?  Regularly fix the edges of walkways and driveways and fix any cracks.  Remove anything that might make you trip as you walk through a door, such as a raised step or threshold.  Trim any bushes or trees on the path to your home.  Use bright outdoor lighting.  Clear any walking paths of anything that might make someone trip, such as rocks or tools.  Regularly check to see if handrails are loose or  broken. Make sure that both sides of any steps have handrails.  Any raised decks and porches should have guardrails on the edges.  Have any leaves, snow, or ice cleared regularly.  Use sand or salt on walking paths during winter.  Clean up any spills in your garage right away. This includes oil or grease spills. What can I do in the bathroom?  Use night lights.  Install grab bars by the toilet and in the tub and shower. Do not use towel bars as grab bars.  Use non-skid mats or decals in the tub or shower.  If you need to sit down in the shower, use a plastic, non-slip stool.  Keep the floor dry. Clean up any water that spills on the floor as soon as it happens.  Remove soap buildup in the tub or shower regularly.  Attach bath mats securely with double-sided non-slip rug tape.  Do not have throw rugs and other things on the floor that can make you trip. What can I do in the bedroom?  Use night lights.  Make sure that you have a light by your bed that is easy to reach.  Do not  use any sheets or blankets that are too big for your bed. They should not hang down onto the floor.  Have a firm chair that has side arms. You can use this for support while you get dressed.  Do not have throw rugs and other things on the floor that can make you trip. What can I do in the kitchen?  Clean up any spills right away.  Avoid walking on wet floors.  Keep items that you use a lot in easy-to-reach places.  If you need to reach something above you, use a strong step stool that has a grab bar.  Keep electrical cords out of the way.  Do not use floor polish or wax that makes floors slippery. If you must use wax, use non-skid floor wax.  Do not have throw rugs and other things on the floor that can make you trip. What can I do with my stairs?  Do not leave any items on the stairs.  Make sure that there are handrails on both sides of the stairs and use them. Fix handrails that are  broken or loose. Make sure that handrails are as long as the stairways.  Check any carpeting to make sure that it is firmly attached to the stairs. Fix any carpet that is loose or worn.  Avoid having throw rugs at the top or bottom of the stairs. If you do have throw rugs, attach them to the floor with carpet tape.  Make sure that you have a light switch at the top of the stairs and the bottom of the stairs. If you do not have them, ask someone to add them for you. What else can I do to help prevent falls?  Wear shoes that:  Do not have high heels.  Have rubber bottoms.  Are comfortable and fit you well.  Are closed at the toe. Do not wear sandals.  If you use a stepladder:  Make sure that it is fully opened. Do not climb a closed stepladder.  Make sure that both sides of the stepladder are locked into place.  Ask someone to hold it for you, if possible.  Clearly mark and make sure that you can see:  Any grab bars or handrails.  First and last steps.  Where the edge of each step is.  Use tools that help you move around (mobility aids) if they are needed. These include:  Canes.  Walkers.  Scooters.  Crutches.  Turn on the lights when you go into a dark area. Replace any light bulbs as soon as they burn out.  Set up your furniture so you have a clear path. Avoid moving your furniture around.  If any of your floors are uneven, fix them.  If there are any pets around you, be aware of where they are.  Review your medicines with your doctor. Some medicines can make you feel dizzy. This can increase your chance of falling. Ask your doctor what other things that you can do to help prevent falls. This information is not intended to replace advice given to you by your health care provider. Make sure you discuss any questions you have with your health care provider. Document Released: 02/22/2009 Document Revised: 10/04/2015 Document Reviewed: 06/02/2014 Elsevier  Interactive Patient Education  2017 [employer].

## 2020-07-09 DIAGNOSIS — G4733 Obstructive sleep apnea (adult) (pediatric): Secondary | ICD-10-CM | POA: Diagnosis not present

## 2020-07-18 DIAGNOSIS — H903 Sensorineural hearing loss, bilateral: Secondary | ICD-10-CM | POA: Diagnosis not present

## 2020-08-03 ENCOUNTER — Other Ambulatory Visit: Payer: Self-pay

## 2020-08-03 ENCOUNTER — Ambulatory Visit
Admission: EM | Admit: 2020-08-03 | Discharge: 2020-08-03 | Disposition: A | Discharge status: Home / Self Care | Payer: Medicare PPO | Attending: Family Medicine | Admitting: Family Medicine

## 2020-08-03 DIAGNOSIS — Z87891 Personal history of nicotine dependence: Secondary | ICD-10-CM | POA: Diagnosis not present

## 2020-08-03 DIAGNOSIS — J0140 Acute pansinusitis, unspecified: Secondary | ICD-10-CM | POA: Diagnosis not present

## 2020-08-03 DIAGNOSIS — R509 Fever, unspecified: Secondary | ICD-10-CM | POA: Diagnosis not present

## 2020-08-03 DIAGNOSIS — R52 Pain, unspecified: Secondary | ICD-10-CM | POA: Insufficient documentation

## 2020-08-03 DIAGNOSIS — Z20822 Contact with and (suspected) exposure to covid-19: Secondary | ICD-10-CM | POA: Diagnosis not present

## 2020-08-03 DIAGNOSIS — R059 Cough, unspecified: Secondary | ICD-10-CM | POA: Diagnosis not present

## 2020-08-03 DIAGNOSIS — Z880 Allergy status to penicillin: Secondary | ICD-10-CM | POA: Insufficient documentation

## 2020-08-03 LAB — RESP PANEL BY RT-PCR (FLU A&B, COVID) ARPGX2
Influenza A by PCR: NEGATIVE
Influenza B by PCR: NEGATIVE
SARS Coronavirus 2 by RT PCR: NEGATIVE

## 2020-08-03 MED ORDER — DOXYCYCLINE HYCLATE 100 MG PO CAPS: 100.0000 mg | ORAL_CAPSULE | Freq: Two times a day (BID) | ORAL | 0 refills | Status: DC

## 2020-08-03 NOTE — ED Triage Notes (Signed)
Pt c/o sinus congestion, sore throat, headache, cough for about 2 days. Pt also reports diarrhea started last night. Pt reports fever up to 101, chills and body aches. Pt states he took at-home COVID test yesterday and it was negative.

## 2020-08-03 NOTE — Discharge Instructions (Signed)
Flu and COVID testing negative.  Tylenol for fever/body aches.  Antibiotic as prescribed.  Take care  Dr. Lacinda Axon

## 2020-08-03 NOTE — ED Provider Notes (Signed)
[facility]    CSN: [id number] Arrival date & time: 08/03/20  0950      History   Chief Complaint Chief Complaint  Patient presents with  . Cough  . Nasal Congestion  . Generalized Body Aches   HPI  75 year old male presents with above complaints.  Symptoms started yesterday.  He reports fever, chills, body aches, congestion, sinus pain and pressure, headache, and cough.  His fever has been as high as 101.4.  He denies any sick contacts.  He has recently traveled to [place] and went to the casino.  He rates his pain a 7/10 in severity.  His wife is not sick.  He has had no other significant contacts recently.  He is concerned that he has sinusitis.  He feels very poorly.  No relieving factors.  No other complaints.  Past Medical History:  Diagnosis Date  . C. difficile colitis   . Coronary artery disease   . Depression   . Diabetes mellitus ([place])   . Diverticulitis   . Diverticulosis   . Environmental allergies   . GERD (gastroesophageal reflux disease)   . Goiter    intrathoracic, s/p benign biopsy (Dr Harlow Asa)  . Hypercholesterolemia   . Hypertension   . Osteoarthritis    cervical spine, lumbar spine  . Paroxysmal atrial fibrillation ([facility])   . PONV (postoperative nausea and vomiting)   . Sleep apnea   . Stroke ([place])   . TIA (transient ischemic attack)     Patient Active Problem List   Diagnosis Date Noted  . Right hip pain 10/23/2019  . Left knee pain 07/29/2019  . Anxiety 10/10/2018  . Left hand pain 08/09/2018  . Left carotid bruit 04/11/2018  . Diarrhea 11/17/2017  . Near syncope 11/17/2017  . Change in bowel habits 06/25/2017  . Abdominal pain 06/11/2017  . Long-term use of high-risk medication 04/08/2017  . Gout 02/16/2017  . Frequent sinus infections 09/13/2016  . Drooping eyelid 09/13/2016  . Acute recurrent maxillary sinusitis 07/24/2016  . Wheezing 05/29/2016  . Fatigue 09/09/2015  . Lesion of soft tissue of lower leg and ankle  04/07/2015  . Anemia, iron deficiency 08/28/2014  . Health care maintenance 06/25/2014  . BMI 33.0-33.9,adult 04/17/2014  . Difficulty sleeping 04/17/2014  . Acute cystitis without hematuria 03/27/2014  . Incomplete emptying of bladder 01/18/2014  . Increased frequency of urination 01/18/2014  . Back pain 11/21/2013  . Lumbar radiculitis 10/11/2013  . Benign essential hypertension 09/28/2013  . CAD (coronary artery disease), native coronary artery 09/28/2013  . Nontoxic uninodular goiter 09/28/2013  . Obstructive sleep apnea 07/19/2012  . Benign localized hyperplasia of prostate with urinary obstruction 05/24/2012  . 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 Hypogonadism male 05/24/2012  . Diabetes mellitus ([place]) 04/02/2012  . Hypertension 04/02/2012  . Hypercholesterolemia 04/02/2012  . GERD (gastroesophageal reflux disease) 04/02/2012  . Intrathoracic goiter 04/02/2012  . Osteoarthritis 04/02/2012  . TIA (transient ischemic attack) 04/02/2012    Past Surgical History:  Procedure Laterality Date  . BACK SURGERY  8/08   s/p fusion of L4-L5  . capsule endoscopy    . CATARACT EXTRACTION  2011   Dr. Herbert Deaner  . [place] SURGERY  2002  . COLONOSCOPY WITH PROPOFOL N/A 07/20/2017   Procedure: COLONOSCOPY WITH PROPOFOL;  Surgeon: Manya Silvas, MD;  Location: [facility]COPY;  Service: Endoscopy;  Laterality: N/A;  . EYE SURGERY     cataract bilateral 10/1999  . eyelid  reduction     Dr. Carlis Abbott  . INGUINAL HERNIA REPAIR  1991   Dr, Tamala Julian  . NOSE SURGERY     turbinate reduction  . SEPTOPLASTY  1975  . SHOULDER SURGERY  2000   rotator cuff       Home Medications    Prior to Admission medications   Medication Sig Start Date End Date Taking? Authorizing Provider  ALPRAZolam (XANAX) 0.25 MG tablet Take 1 tablet (0.25 mg total) by mouth daily as needed. 06/05/20  Yes Einar Pheasant, MD  amLODipine (NORVASC) 10 MG tablet TAKE 1 TABLET BY MOUTH ONCE DAILY  09/05/19  Yes Einar Pheasant, MD  Ascorbic Acid (VITAMIN C) 100 MG tablet Take 100 mg by mouth daily.   Yes [provider]  aspirin 81 MG tablet Take 81 mg by mouth daily.   Yes [provider]  atorvastatin (LIPITOR) 10 MG tablet Take 1 tablet (10 mg total) by mouth daily. 02/28/20  Yes Einar Pheasant, MD  Azelastine-Fluticasone (DYMISTA NA) Place 2 puffs into the nose daily.   Yes [provider]  doxycycline (VIBRAMYCIN) 100 MG capsule Take 1 capsule (100 mg total) by mouth 2 (two) times daily. 08/03/20  Yes Delonta Yohannes G, DO  hydrochlorothiazide (HYDRODIURIL) 50 MG tablet Take 1 tablet (50 mg total) by mouth daily. 02/28/20  Yes Einar Pheasant, MD  HYDROcodone-acetaminophen (NORCO) 10-325 MG tablet hydrocodone 10 mg-acetaminophen 325 mg tablet  Take 1 tablet 3 times a day by oral route as needed for pain.   Yes [provider]  levocetirizine (XYZAL) 5 MG tablet Take 1 tablet (5 mg total) by mouth every evening. 06/05/20  Yes Einar Pheasant, MD  lisinopril (ZESTRIL) 40 MG tablet TAKE 1 TABLET BY MOUTH ONCE DAILY 03/30/20  Yes Einar Pheasant, MD  metFORMIN (GLUCOPHAGE) 1000 MG tablet TAKE 1 TABLET BY MOUTH TWICE DAILY WITH MEALS 05/07/20  Yes Einar Pheasant, MD  metoprolol succinate (TOPROL-XL) 25 MG 24 hr tablet Take 1 tablet (25 mg total) by mouth 2 (two) times daily. 02/28/20  Yes Einar Pheasant, MD  montelukast (SINGULAIR) 10 MG tablet Take 1 tablet (10 mg total) by mouth at bedtime. 06/05/20  Yes Einar Pheasant, MD  Multiple Vitamin (MULTIVITAMIN) tablet Take 1 tablet by mouth daily.   Yes [provider]  pantoprazole (PROTONIX) 40 MG tablet TAKE 1 TABLET BY MOUTH ONCE DAILY 05/07/20  Yes Einar Pheasant, MD  pioglitazone (ACTOS) 15 MG tablet Take 1 tablet (15 mg total) by mouth daily. 06/05/20  Yes Einar Pheasant, MD  sertraline (ZOLOFT) 50 MG tablet TAKE 1 TABLET BY MOUTH ONCE DAILY 05/07/20  Yes Einar Pheasant, MD  tamsulosin (FLOMAX)  0.4 MG CAPS capsule Take 0.4 mg by mouth daily with breakfast.  03/24/14  Yes [provider]  testosterone cypionate (DEPOTESTOTERONE CYPIONATE) 200 MG/ML injection Inject 200 mg into the muscle every 14 (fourteen) days. 08/17/14  Yes [provider]  traZODone (DESYREL) 50 MG tablet Take 1.5 tablets (75 mg total) by mouth at bedtime. 06/05/20  Yes Einar Pheasant, MD  TRULICITY 9.67 EL/3.8BO SOPN INJECT 0.75MG SUBQ ONCE A WEEK 05/07/20  Yes Einar Pheasant, MD  blood glucose meter kit and supplies Dispense based on patient and insurance preference. Use up to four times daily as directed. (FOR ICD-10 E10.9, E11.9). 11/11/19   Einar Pheasant, MD  glucose blood (ACCU-CHEK AVIVA PLUS) test strip TEST SUGAR TWICE DAILY 11/11/19   Einar Pheasant, MD    Family History Family History  Problem Relation Age  of Onset  . Congestive Heart Failure Father   . Heart disease Father        myocardial infarction  . Rheumatic fever Father        valvular disease  . Heart disease Mother        s/p CABG (age 46)  . Kidney disease Sister   . Colon cancer Neg Hx   . Prostate cancer Neg Hx     Social History Social History   Tobacco Use  . Smoking status: Former Research scientist (life sciences)  . Smokeless tobacco: Never Used  Vaping Use  . Vaping Use: Never used  Substance Use Topics  . Alcohol use: Yes    Alcohol/week: 0.0 standard drinks    Comment: occasional  . Drug use: No     Allergies   Penicillins   Review of Systems Review of Systems Per HPI  Physical Exam Triage Vital Signs ED Triage Vitals  Enc Vitals Group     BP 08/03/20 1005 (!) 148/61     Pulse Rate 08/03/20 1005 78     Resp 08/03/20 1005 18     Temp 08/03/20 1005 99 F (37.2 C)     Temp Source 08/03/20 1005 Oral     SpO2 08/03/20 1005 99 %     Weight 08/03/20 1003 200 lb (90.7 kg)     Height 08/03/20 1003 '5\' 9"'  (1.753 m)     Head Circumference --      Peak Flow --      Pain Score 08/03/20 1002 7     Pain Loc --      Pain  Edu? --      Excl. in [place]? --    Updated Vital Signs BP (!) 148/61 (BP Location: Left Arm)   Pulse 78   Temp 99 F (37.2 C) (Oral)   Resp 18   Ht '5\' 9"'  (1.753 m)   Wt 90.7 kg   SpO2 99%   BMI 29.53 kg/m   Visual Acuity Right Eye Distance:   Left Eye Distance:   Bilateral Distance:    Right Eye Near:   Left Eye Near:    Bilateral Near:     Physical Exam Vitals and nursing note reviewed.  Constitutional:      General: He is not in acute distress.    Appearance: Normal appearance. He is not ill-appearing.  HENT:     Head: Normocephalic and atraumatic.     Nose: Congestion present.     Mouth/Throat:     Pharynx: Posterior oropharyngeal erythema present.  Eyes:     General:        Right eye: No discharge.        Left eye: No discharge.     Conjunctiva/sclera: Conjunctivae normal.  Cardiovascular:     Rate and Rhythm: Normal rate and regular rhythm.  Pulmonary:     Effort: Pulmonary effort is normal.     Breath sounds: Normal breath sounds. No wheezing, rhonchi or rales.  Neurological:     Mental Status: He is alert.  Psychiatric:        Mood and Affect: Mood normal.        Behavior: Behavior normal.    UC Treatments / Results  Labs (all labs ordered are listed, but only abnormal results are displayed) Labs Reviewed  RESP PANEL BY RT-PCR (FLU A&B, COVID) [id number]    EKG   Radiology No results found.  Procedures Procedures (including critical care time)  Medications Ordered in UC  Medications - No data to display  Initial Impression / Assessment and Plan / UC Course  I have reviewed the triage vital signs and the nursing notes.  Pertinent labs & imaging results that were available during my care of the patient were reviewed by me and considered in my medical decision making (see chart for details).    75 year old male presents with the above complaints. This is an acute illness with systemic symptoms (fever, body aches, chills). Flu and COVID negative.  This is consistent with acute bacterial sinusitis. Treating with doxycycline.  Final Clinical Impressions(s) / UC Diagnoses   Final diagnoses:  Acute pansinusitis, recurrence not specified     Discharge Instructions     Flu and COVID testing negative.  Tylenol for fever/body aches.  Antibiotic as prescribed.  Take care  Dr. Lacinda Axon     ED Prescriptions    Medication Sig Dispense Auth. Provider   doxycycline (VIBRAMYCIN) 100 MG capsule Take 1 capsule (100 mg total) by mouth 2 (two) times daily. 20 capsule Coral Spikes, DO     PDMP not reviewed this encounter.   Coral Spikes, [place] 08/03/20 1217

## 2020-08-27 ENCOUNTER — Other Ambulatory Visit: Payer: Self-pay | Admitting: Internal Medicine

## 2020-08-27 NOTE — Telephone Encounter (Signed)
RX Refill:xanax Last Seen:06-05-20 Last ordered:06-05-20

## 2020-08-28 NOTE — Telephone Encounter (Signed)
rx ok'd for xanax #30 with no refills.   

## 2020-09-10 ENCOUNTER — Other Ambulatory Visit: Payer: Self-pay | Admitting: Internal Medicine

## 2020-09-12 DIAGNOSIS — M5416 Radiculopathy, lumbar region: Secondary | ICD-10-CM | POA: Diagnosis not present

## 2020-09-14 ENCOUNTER — Emergency Department
Admission: EM | Admit: 2020-09-14 | Discharge: 2020-09-15 | Disposition: A | Discharge status: Home / Self Care | Payer: Medicare PPO | Attending: Emergency Medicine | Admitting: Emergency Medicine

## 2020-09-14 ENCOUNTER — Emergency Department: Payer: Medicare PPO

## 2020-09-14 ENCOUNTER — Other Ambulatory Visit: Payer: Self-pay

## 2020-09-14 DIAGNOSIS — Z794 Long term (current) use of insulin: Secondary | ICD-10-CM | POA: Insufficient documentation

## 2020-09-14 DIAGNOSIS — I639 Cerebral infarction, unspecified: Secondary | ICD-10-CM | POA: Diagnosis not present

## 2020-09-14 DIAGNOSIS — Z79899 Other long term (current) drug therapy: Secondary | ICD-10-CM | POA: Insufficient documentation

## 2020-09-14 DIAGNOSIS — Z7982 Long term (current) use of aspirin: Secondary | ICD-10-CM | POA: Diagnosis not present

## 2020-09-14 DIAGNOSIS — R200 Anesthesia of skin: Secondary | ICD-10-CM | POA: Diagnosis not present

## 2020-09-14 DIAGNOSIS — R29810 Facial weakness: Secondary | ICD-10-CM | POA: Diagnosis present

## 2020-09-14 DIAGNOSIS — R29818 Other symptoms and signs involving the nervous system: Secondary | ICD-10-CM | POA: Diagnosis not present

## 2020-09-14 DIAGNOSIS — G510 Bell's palsy: Secondary | ICD-10-CM | POA: Diagnosis not present

## 2020-09-14 DIAGNOSIS — Z7984 Long term (current) use of oral hypoglycemic drugs: Secondary | ICD-10-CM | POA: Diagnosis not present

## 2020-09-14 DIAGNOSIS — E119 Type 2 diabetes mellitus without complications: Secondary | ICD-10-CM | POA: Insufficient documentation

## 2020-09-14 DIAGNOSIS — Z87891 Personal history of nicotine dependence: Secondary | ICD-10-CM | POA: Insufficient documentation

## 2020-09-14 DIAGNOSIS — I2510 Atherosclerotic heart disease of native coronary artery without angina pectoris: Secondary | ICD-10-CM | POA: Insufficient documentation

## 2020-09-14 DIAGNOSIS — I10 Essential (primary) hypertension: Secondary | ICD-10-CM | POA: Insufficient documentation

## 2020-09-14 LAB — CBC
HCT: 45.6 % (ref 39.0–52.0)
Hemoglobin: 14.9 g/dL (ref 13.0–17.0)
MCH: 28.6 pg (ref 26.0–34.0)
MCHC: 32.7 g/dL (ref 30.0–36.0)
MCV: 87.5 fL (ref 80.0–100.0)
Platelets: 297 10*3/uL (ref 150–400)
RBC: 5.21 MIL/uL (ref 4.22–5.81)
RDW: 13.2 % (ref 11.5–15.5)
WBC: 13.4 10*3/uL — ABNORMAL HIGH (ref 4.0–10.5)
nRBC: 0 % (ref 0.0–0.2)

## 2020-09-14 LAB — COMPREHENSIVE METABOLIC PANEL
ALT: 24 U/L (ref 0–44)
AST: 32 U/L (ref 15–41)
Albumin: 4.3 g/dL (ref 3.5–5.0)
Alkaline Phosphatase: 47 U/L (ref 38–126)
Anion gap: 14 (ref 5–15)
BUN: 22 mg/dL (ref 8–23)
CO2: 24 mmol/L (ref 22–32)
Calcium: 9.9 mg/dL (ref 8.9–10.3)
Chloride: 100 mmol/L (ref 98–111)
Creatinine, Ser: 0.98 mg/dL (ref 0.61–1.24)
GFR, Estimated: >60 mL/min (ref >60)
Glucose, Bld: 161 mg/dL — ABNORMAL HIGH (ref 70–99)
Potassium: 4 mmol/L (ref 3.5–5.1)
Sodium: 138 mmol/L (ref 135–145)
Total Bilirubin: 1.3 mg/dL — ABNORMAL HIGH (ref 0.3–1.2)
Total Protein: 7.6 g/dL (ref 6.5–8.1)

## 2020-09-14 LAB — DIFFERENTIAL
Abs Immature Granulocytes: 0.05 10*3/uL (ref 0.00–0.07)
Basophils Absolute: 0.1 10*3/uL (ref 0.0–0.1)
Basophils Relative: 0 %
Eosinophils Absolute: 0.1 10*3/uL (ref 0.0–0.5)
Eosinophils Relative: 0 %
Immature Granulocytes: 0 %
Lymphocytes Relative: 15 %
Lymphs Abs: 2 10*3/uL (ref 0.7–4.0)
Monocytes Absolute: 0.8 10*3/uL (ref 0.1–1.0)
Monocytes Relative: 6 %
Neutro Abs: 10.5 10*3/uL — ABNORMAL HIGH (ref 1.7–7.7)
Neutrophils Relative %: 79 %

## 2020-09-14 LAB — PROTIME-INR
INR: 0.9 (ref 0.8–1.2)
Prothrombin Time: 12.6 seconds (ref 11.4–15.2)

## 2020-09-14 LAB — APTT: aPTT: 27 seconds (ref 24–36)

## 2020-09-14 LAB — CBG MONITORING, ED: Glucose-Capillary: 169 mg/dL — ABNORMAL HIGH (ref 70–99)

## 2020-09-14 MED ORDER — PREDNISONE 20 MG PO TABS: 60.0000 mg | ORAL_TABLET | Freq: Every day | ORAL | 0 refills | Status: AC

## 2020-09-14 MED ORDER — LORAZEPAM 2 MG/ML IJ SOLN
1.0000 mg | INTRAMUSCULAR | Status: DC | PRN
  Administered 2020-09-14: 1 mg via INTRAVENOUS
  Filled 2020-09-14: qty 1

## 2020-09-14 MED ORDER — MORPHINE SULFATE (PF) 4 MG/ML IV SOLN
INTRAVENOUS | Status: AC
  Administered 2020-09-14: 4 mg via INTRAVENOUS
  Filled 2020-09-14: qty 1

## 2020-09-14 MED ORDER — SODIUM CHLORIDE 0.9% FLUSH: 3.0000 mL | Freq: Once | INTRAVENOUS | Status: DC

## 2020-09-14 MED ORDER — ACYCLOVIR 400 MG PO TABS: 400.0000 mg | ORAL_TABLET | Freq: Every day | ORAL | 0 refills | Status: AC

## 2020-09-14 MED ORDER — MORPHINE SULFATE (PF) 4 MG/ML IV SOLN: 4.0000 mg | Freq: Once | INTRAVENOUS | Status: AC

## 2020-09-14 NOTE — ED Notes (Signed)
Pt to room 5, emily and nicole rn at bedside.

## 2020-09-14 NOTE — ED Notes (Signed)
Teleneuro brought in room at 2030. Call answered at 2035.

## 2020-09-14 NOTE — ED Notes (Signed)
MRI tech at bedside.

## 2020-09-14 NOTE — Consult Note (Signed)
[facility] Tele[facility]urology Consult Services   Date of Service:   09/14/2020 20:40:20  Diagnosis:     .  G51.0 - Bell palsy  Impression:     . Ricardo Nash is a 75 year old male who is evaluated for his numbness, ptosis and weakness on the right face. His findings are related to Bell's palsy. I reviewed the findings with the ER physician. The patient will be started on steroids and acyclovir and be admitted for a brain MRI with and without contrast.  Metrics: Last Known Well: 09/14/2020 17:00:00 [facility] Notification Time: 09/14/2020 20:40:20 Arrival Time: 09/14/2020 20:13:00 Stamp Time: 09/14/2020 20:40:20 Initial Response Time: 09/14/2020 20:43:10 Symptoms: right facial weakness. NIHSS Start Assessment Time: 09/14/2020 20:45:22 Patient is not a candidate for Thrombolytic. Thrombolytic Medical Decision: 09/14/2020 20:51:12 Patient was not deemed candidate for Thrombolytic because of following reasons: No disabling symptoms. Other Diagnosis suspected.  CT head showed no acute hemorrhage or acute core infarct.  ED Physician notified of diagnostic impression and management plan on 09/14/2020 21:18:28  Advanced Imaging: Advanced Imaging Not Recommended because:  Clinical presentation is not suggestion of LVO or Low clinical suspicion of LVO based on presentation   Our recommendations are outlined below.  Recommendations:      .  Stroke/Telemetry Floor     .  Neuro Checks     .  Bedside Swallow Eval     .  DVT Prophylaxis     .  IV Fluids, Normal Saline     .  Head of Bed 30 Degrees     .  Euglycemia and Avoid Hyperthermia (PRN Acetaminophen)   Sign Out:     .  Discussed with Emergency Department Provider    ------------------------------------------------------------------------------  History of Present Illness: Patient is a 75 year old Male.  Patient was brought by private transportation with symptoms of right facial weakness.  Ricardo Nash is a 75 year old male with CAD, DM, HTN, HLD, paroxysmal a-fib, prior stroke and sleep apnea who presented to the ER for evaluation of right facial numbness, weakness and right eye ptosis. He feels the numbness is improving. He does report a prior history of Bell's palsy. He has no associated speech changes or extremity weakness.   Past Medical History:     . Hypertension     . Diabetes Mellitus     . Hyperlipidemia     . Atrial Fibrillation     . Coronary Artery Disease     . Stroke  Social History:Unable to obtain due to Patient Status  Family History:     . noncontributory  Anticoagulant use:  No  Antiplatelet use: Yes ASA  Allergies:  Reviewed    Examination: BP(186/74), Pulse(82), Blood Glucose(169) 1A: Level of Consciousness - Alert; keenly responsive + 0 1B: Ask Month and Age - Both Questions Right + 0 1C: Blink Eyes & Squeeze Hands - Performs Both Tasks + 0 2: Test Horizontal Extraocular Movements - Normal + 0 3: Test Visual Fields - No Visual Loss + 0 4: Test Facial Palsy (Use Grimace if Obtunded) - Unilateral Complete paralysis (upper/lower face) + 3 5A: Test Left Arm Motor Drift - No Drift for 10 Seconds + 0 5B: Test Right Arm Motor Drift - No Drift for 10 Seconds + 0 6A: Test Left Leg Motor Drift - No Drift for 5 Seconds + 0 6B: Test Right Leg Motor Drift - No Drift for 5 Seconds + 0 7: Test Limb Ataxia (FNF/Heel-Shin) - No Ataxia +  0 8: Test Sensation - Normal; No sensory loss + 0 9: Test Language/Aphasia - Normal; No aphasia + 0 10: Test Dysarthria - Normal + 0 11: Test Extinction/Inattention - No abnormality + 0  NIHSS Score: 3   Pre-Morbid Modified Rankin Scale: 1 Points = No significant disability despite symptoms; able to carry out all usual duties and activities   Patient/Family was informed the Neurology Consult would occur via TeleHealth consult by way of interactive audio and video telecommunications and consented to receiving care in  this manner.   Patient is being evaluated for possible acute neurologic impairment and high probability of imminent or life-threatening deterioration. I spent total of 45 minutes providing care to this patient, including time for face to face visit via telemedicine, review of medical records, imaging studies and discussion of findings with providers, the patient and/or family.   Dr Birdena Jubilee   [facility] [phone]  Case [id number]

## 2020-09-14 NOTE — ED Provider Notes (Signed)
[facility] Emergency Department Provider Note   ____________________________________________   Event Date/Time   First MD Initiated Contact with Patient 09/14/20 2019     (approximate)  I have reviewed the triage vital signs and the nursing notes.   HISTORY  Chief Complaint Code Stroke    HPI Ricardo Nash is a 75 y.o. male with past medical history of hypertension, hyperlipidemia, diabetes, CAD, stroke, and paroxysmal atrial fibrillation who presents to the ED for possible stroke.  Patient reports that around 5 PM this afternoon he noticed that the right side of his face was drooping.  He states it is hard for him to completely close his eye and the right side of his mouth is drooping more than the left.  He denies any associated numbness and he has not had any vision or speech changes.  He denies any numbness or weakness in his extremities.  He has felt well recently with no fevers, cough, chest pain, shortness of breath, dysuria, or hematuria.        Past Medical History:  Diagnosis Date  . C. difficile colitis   . Coronary artery disease   . Depression   . Diabetes mellitus ([place])   . Diverticulitis   . Diverticulosis   . Environmental allergies   . GERD (gastroesophageal reflux disease)   . Goiter    intrathoracic, s/p benign biopsy (Dr Harlow Asa)  . Hypercholesterolemia   . Hypertension   . Osteoarthritis    cervical spine, lumbar spine  . Paroxysmal atrial fibrillation ([facility])   . PONV (postoperative nausea and vomiting)   . Sleep apnea   . Stroke ([place])   . TIA (transient ischemic attack)     Patient Active Problem List   Diagnosis Date Noted  . Right hip pain 10/23/2019  . Left knee pain 07/29/2019  . Anxiety 10/10/2018  . Left hand pain 08/09/2018  . Left carotid bruit 04/11/2018  . Diarrhea 11/17/2017  . Near syncope 11/17/2017  . Change in bowel habits 06/25/2017  . Abdominal pain 06/11/2017  . Long-term use of high-risk  medication 04/08/2017  . Gout 02/16/2017  . Frequent sinus infections 09/13/2016  . Drooping eyelid 09/13/2016  . Acute recurrent maxillary sinusitis 07/24/2016  . Wheezing 05/29/2016  . Fatigue 09/09/2015  . Lesion of soft tissue of lower leg and ankle 04/07/2015  . Anemia, iron deficiency 08/28/2014  . Health care maintenance 06/25/2014  . BMI 33.0-33.9,adult 04/17/2014  . Difficulty sleeping 04/17/2014  . Acute cystitis without hematuria 03/27/2014  . Incomplete emptying of bladder 01/18/2014  . Increased frequency of urination 01/18/2014  . Back pain 11/21/2013  . Lumbar radiculitis 10/11/2013  . Benign essential hypertension 09/28/2013  . CAD (coronary artery disease), native coronary artery 09/28/2013  . Nontoxic uninodular goiter 09/28/2013  . Obstructive sleep apnea 07/19/2012  . Benign localized hyperplasia of prostate with urinary obstruction 05/24/2012  . 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 Hypogonadism male 05/24/2012  . Diabetes mellitus ([place]) 04/02/2012  . Hypertension 04/02/2012  . Hypercholesterolemia 04/02/2012  . GERD (gastroesophageal reflux disease) 04/02/2012  . Intrathoracic goiter 04/02/2012  . Osteoarthritis 04/02/2012  . TIA (transient ischemic attack) 04/02/2012    Past Surgical History:  Procedure Laterality Date  . BACK SURGERY  8/08   s/p fusion of L4-L5  . capsule endoscopy    . CATARACT EXTRACTION  2011   Dr. Herbert Deaner  . [place] SURGERY  2002  . COLONOSCOPY WITH PROPOFOL N/A 07/20/2017  Procedure: COLONOSCOPY WITH PROPOFOL;  Surgeon: Manya Silvas, MD;  Location: [facility] ENDOSCOPY;  Service: Endoscopy;  Laterality: N/A;  . EYE SURGERY     cataract bilateral 10/1999  . eyelid reduction     Dr. Carlis Abbott  . INGUINAL HERNIA REPAIR  1991   Dr, Tamala Julian  . NOSE SURGERY     turbinate reduction  . SEPTOPLASTY  1975  . SHOULDER SURGERY  2000   rotator cuff    Prior to Admission medications   Medication Sig Start Date  End Date Taking? Authorizing Provider  acyclovir (ZOVIRAX) 400 MG tablet Take 1 tablet (400 mg total) by mouth 5 (five) times daily for 7 days. 09/14/20 09/21/20 Yes Blake Divine, MD  ALPRAZolam Duanne Moron) 0.25 MG tablet TAKE 1 TABLET BY MOUTH ONCE DAILY AS NEEDED 08/28/20  Yes Einar Pheasant, MD  amLODipine (NORVASC) 10 MG tablet TAKE 1 TABLET BY MOUTH ONCE DAILY 09/10/20  Yes Einar Pheasant, MD  Ascorbic Acid (VITAMIN C) 100 MG tablet Take 100 mg by mouth daily.   Yes [provider]  aspirin 81 MG tablet Take 81 mg by mouth daily.   Yes [provider]  atorvastatin (LIPITOR) 10 MG tablet Take 1 tablet (10 mg total) by mouth daily. 02/28/20  Yes Einar Pheasant, MD  Azelastine-Fluticasone (DYMISTA NA) Place 2 puffs into the nose daily.   Yes [provider]  blood glucose meter kit and supplies Dispense based on patient and insurance preference. Use up to four times daily as directed. (FOR ICD-10 E10.9, E11.9). 11/11/19  Yes Einar Pheasant, MD  glucose blood (ACCU-CHEK AVIVA PLUS) test strip TEST SUGAR TWICE DAILY 11/11/19  Yes Einar Pheasant, MD  hydrochlorothiazide (HYDRODIURIL) 50 MG tablet Take 1 tablet (50 mg total) by mouth daily. 02/28/20  Yes Einar Pheasant, MD  levocetirizine (XYZAL) 5 MG tablet Take 1 tablet (5 mg total) by mouth every evening. 06/05/20  Yes Einar Pheasant, MD  lisinopril (ZESTRIL) 40 MG tablet TAKE 1 TABLET BY MOUTH ONCE DAILY 03/30/20  Yes Einar Pheasant, MD  metFORMIN (GLUCOPHAGE) 1000 MG tablet TAKE 1 TABLET BY MOUTH TWICE DAILY WITH MEALS 05/07/20  Yes Einar Pheasant, MD  metoprolol succinate (TOPROL-XL) 25 MG 24 hr tablet Take 1 tablet (25 mg total) by mouth 2 (two) times daily. 02/28/20  Yes Einar Pheasant, MD  montelukast (SINGULAIR) 10 MG tablet Take 1 tablet (10 mg total) by mouth at bedtime. 06/05/20  Yes Einar Pheasant, MD  Multiple Vitamin (MULTIVITAMIN) tablet Take 1 tablet by mouth daily.   Yes [provider]   pantoprazole (PROTONIX) 40 MG tablet TAKE 1 TABLET BY MOUTH ONCE DAILY 05/07/20  Yes Einar Pheasant, MD  pioglitazone (ACTOS) 15 MG tablet Take 1 tablet (15 mg total) by mouth daily. 06/05/20  Yes Einar Pheasant, MD  predniSONE (DELTASONE) 20 MG tablet Take 3 tablets (60 mg total) by mouth daily with breakfast for 5 days. 09/14/20 09/19/20 Yes Blake Divine, MD  sertraline (ZOLOFT) 50 MG tablet TAKE 1 TABLET BY MOUTH ONCE DAILY 05/07/20  Yes Einar Pheasant, MD  tamsulosin (FLOMAX) 0.4 MG CAPS capsule Take 0.4 mg by mouth daily with breakfast.  03/24/14  Yes [provider]  testosterone cypionate (DEPOTESTOTERONE CYPIONATE) 200 MG/ML injection Inject 200 mg into the muscle every 14 (fourteen) days. 08/17/14  Yes [provider]  traZODone (DESYREL) 50 MG tablet Take 1.5 tablets (75 mg total) by mouth at bedtime. 06/05/20  Yes Einar Pheasant, MD  TRULICITY 1.61 WR/6.0AV SOPN INJECT 0.75MG SUBQ ONCE  A WEEK 05/07/20  Yes Einar Pheasant, MD  doxycycline (VIBRAMYCIN) 100 MG capsule Take 1 capsule (100 mg total) by mouth 2 (two) times daily. Patient not taking: No sig reported 08/03/20   Coral Spikes, DO  HYDROcodone-acetaminophen (NORCO) 10-325 MG tablet hydrocodone 10 mg-acetaminophen 325 mg tablet  Take 1 tablet 3 times a day by oral route as needed for pain. Patient not taking: No sig reported    [provider]    Allergies Penicillins  Family History  Problem Relation Age of Onset  . Congestive Heart Failure Father   . Heart disease Father        myocardial infarction  . Rheumatic fever Father        valvular disease  . Heart disease Mother        s/p CABG (age 40)  . Kidney disease Sister   . Colon cancer Neg Hx   . Prostate cancer Neg Hx     Social History Social History   Tobacco Use  . Smoking status: Former Research scientist (life sciences)  . Smokeless tobacco: Never Used  Vaping Use  . Vaping Use: Never used  Substance Use Topics  . Alcohol use: Yes    Alcohol/week:  0.0 standard drinks    Comment: occasional  . Drug use: No    Review of Systems  Constitutional: No fever/chills Eyes: No visual changes. ENT: No sore throat. Cardiovascular: Denies chest pain. Respiratory: Denies shortness of breath. Gastrointestinal: No abdominal pain.  No nausea, no vomiting.  No diarrhea.  No constipation. Genitourinary: Negative for dysuria. Musculoskeletal: Negative for back pain. Skin: Negative for rash. Neurological: Negative for headaches, positive for facial droop.  ____________________________________________   PHYSICAL EXAM:  VITAL SIGNS: ED Triage Vitals [09/14/20 2022]  Enc Vitals Group     BP      Pulse      Resp      Temp      Temp src      SpO2      Weight      Height      Head Circumference      Peak Flow      Pain Score 0     Pain Loc      Pain Edu?      Excl. in [place]?     Constitutional: Alert and oriented. Eyes: Conjunctivae are normal. Head: Atraumatic. Nose: No congestion/rhinnorhea. Mouth/Throat: Mucous membranes are moist. Neck: Normal ROM Cardiovascular: Normal rate, regular rhythm. Grossly normal heart sounds. Respiratory: Normal respiratory effort.  No retractions. Lungs CTAB. Gastrointestinal: Soft and nontender. No distention. Genitourinary: deferred Musculoskeletal: No lower extremity tenderness nor edema. Neurologic:  Normal speech and language.  Right-sided facial droop noted involving upper face.  No focal deficits in extremities noted.  Sensation intact throughout. Skin:  Skin is warm, dry and intact. No rash noted. Psychiatric: Mood and affect are normal. Speech and behavior are normal.  ____________________________________________   LABS (all labs ordered are listed, but only abnormal results are displayed)  Labs Reviewed  CBC - Abnormal; Notable for the following components:      Result Value   WBC 13.4 (*)    All other components within normal limits  DIFFERENTIAL - Abnormal; Notable for the  following components:   Neutro Abs 10.5 (*)    All other components within normal limits  COMPREHENSIVE METABOLIC PANEL - Abnormal; Notable for the following components:   Glucose, Bld 161 (*)    Total Bilirubin 1.3 (*)  All other components within normal limits  CBG MONITORING, ED - Abnormal; Notable for the following components:   Glucose-Capillary 169 (*)    All other components within normal limits  PROTIME-INR  APTT  I-STAT CREATININE, ED   ____________________________________________  EKG  ED ECG REPORT I, Blake Divine, the attending physician, personally viewed and interpreted this ECG.   Date: 09/14/2020  EKG Time: 20:36  Rate: 79  Rhythm: normal sinus rhythm  Axis: Normal  Intervals:first-degree A-V block  and left bundle branch block  ST&T Change: None   PROCEDURES  Procedure(s) performed (including Critical Care):  Procedures   ____________________________________________   INITIAL IMPRESSION / ASSESSMENT AND PLAN / ED COURSE       75 year old male with past medical history of hypertension, hyperlipidemia, diabetes, CAD, stroke, and paroxysmal atrial fibrillation who presents to the ED with right-sided facial droop since 5 PM this evening.  Code stroke activated at the time of arrival, although symptoms seem most consistent with a Bell's palsy as it involves his upper face.  CT head reviewed by me and shows no obvious hemorrhage, negative for acute process per radiology.  Patient evaluated by telemetry neurologist, who recommends proceeding with MRI brain given patient's stroke risk factors.  Labs without acute abnormality.  MRI brain is negative for acute process, given this finding patient symptoms are consistent with a Bell's palsy.  He is appropriate for discharge home with course of steroids and acyclovir.  He was also counseled to keep his right eye moist with over-the-counter artificial tears.  He was counseled to follow-up with neurology and return  to the ED for new or worsening symptoms, patient agrees with plan.      ____________________________________________   FINAL CLINICAL IMPRESSION(S) / ED DIAGNOSES  Final diagnoses:  Bell's palsy     ED Discharge Orders         Ordered    predniSONE (DELTASONE) 20 MG tablet  Daily with breakfast        09/14/20 2334    acyclovir (ZOVIRAX) 400 MG tablet  5 times daily        09/14/20 2334           Note:  This document was prepared using Dragon voice recognition software and may include unintentional dictation errors.   Blake Divine, MD 09/14/20 [phone]

## 2020-09-14 NOTE — ED Notes (Signed)
CBG 169. 

## 2020-09-14 NOTE — ED Triage Notes (Addendum)
Pt comoplains of right sided facial numbness that began at 1700 today. Pt with right ptosis and right sided facial droop, facial asymmetry noted. Pt moving all extremities, speech is clear. Strong equal upper extremity grips noted.

## 2020-09-14 NOTE — ED Notes (Signed)
Patient transported to MRI 

## 2020-09-14 NOTE — ED Notes (Signed)
Pt cleared by dr. Charna Archer and to ct in wheelchair with this RN.

## 2020-09-15 NOTE — ED Notes (Signed)
Pt agreeable with d/c plan as discussed by provider- this nurse has provided pt with written copy and verbally reinforced d/c instructions- pt acknowledges verbal understanding and denies any additional questions, concerns, needs.   Escorted to vehicle(wife driving) via w/c

## 2020-09-16 ENCOUNTER — Encounter: Payer: Self-pay | Admitting: Internal Medicine

## 2020-09-17 ENCOUNTER — Encounter: Payer: Self-pay | Admitting: Internal Medicine

## 2020-09-17 ENCOUNTER — Other Ambulatory Visit: Payer: Self-pay

## 2020-09-17 ENCOUNTER — Ambulatory Visit: Payer: Medicare PPO | Admitting: Internal Medicine

## 2020-09-17 DIAGNOSIS — M25551 Pain in right hip: Secondary | ICD-10-CM | POA: Diagnosis not present

## 2020-09-17 DIAGNOSIS — I10 Essential (primary) hypertension: Secondary | ICD-10-CM | POA: Diagnosis not present

## 2020-09-17 DIAGNOSIS — G4733 Obstructive sleep apnea (adult) (pediatric): Secondary | ICD-10-CM | POA: Diagnosis not present

## 2020-09-17 DIAGNOSIS — D509 Iron deficiency anemia, unspecified: Secondary | ICD-10-CM

## 2020-09-17 DIAGNOSIS — K219 Gastro-esophageal reflux disease without esophagitis: Secondary | ICD-10-CM | POA: Diagnosis not present

## 2020-09-17 DIAGNOSIS — F439 Reaction to severe stress, unspecified: Secondary | ICD-10-CM

## 2020-09-17 DIAGNOSIS — E7800 Pure hypercholesterolemia, unspecified: Secondary | ICD-10-CM

## 2020-09-17 DIAGNOSIS — M545 Low back pain, unspecified: Secondary | ICD-10-CM | POA: Diagnosis not present

## 2020-09-17 DIAGNOSIS — E1159 Type 2 diabetes mellitus with other circulatory complications: Secondary | ICD-10-CM | POA: Diagnosis not present

## 2020-09-17 MED ORDER — SERTRALINE HCL 50 MG PO TABS: ORAL_TABLET | ORAL | 0 refills | Status: DC

## 2020-09-17 MED ORDER — ALPRAZOLAM 0.25 MG PO TABS: 0.2500 mg | ORAL_TABLET | Freq: Two times a day (BID) | ORAL | 0 refills | Status: DC | PRN

## 2020-09-17 NOTE — Telephone Encounter (Signed)
Please call Ricardo Nash and see if he can schedule appt today.   I can work him in today at 11:00.  Ok to do virtual if he desires.  I want to confirm he is ok.

## 2020-09-17 NOTE — Patient Instructions (Signed)
Increase zoloft to 75mg  per day (take 1 1/2 tablet per day  xanax one tablet twice a day as needed.

## 2020-09-17 NOTE — Telephone Encounter (Signed)
Called and scheduled Patient. Will come in office at 11:00 am.

## 2020-09-17 NOTE — Progress Notes (Signed)
Patient ID: Ricardo Nash, male   DOB: 1945/12/10, 75 y.o.   MRN: [id number]   Subjective:    Patient ID: Ricardo Nash, male    DOB: Jan 29, 1946, 75 y.o.   MRN: [id number]  HPI This visit occurred during the SARS-CoV-2 public health emergency.  Safety protocols were in place, including screening questions prior to the visit, additional usage of staff PPE, and extensive cleaning of exam room while observing appropriate contact time as indicated for disinfecting solutions.  Patient here as a work in for ER follow up.  Evaluated in ER 09/14/20 and diagnosed with Bells Palsey.  MRI - negative for acute process.  Neurology was consulted.  Discharged on prednisone and acyclovir. Has been keeping his right eye moist and covers prn. No headache.  Affecting his eating.  No nausea or vomiting.  No chest pain or sob reported.  No abdominal pain.  Bowels moving.  Recently went on a cruise.  Did a lot of walking.  After a couple of days, was flat on his back.  Saw Dr Nelva Bush - PA.  Increased pain.  Planning for injection 10/09/20.  Taking oxycodone.  Increased stress related to above.  Also has been having problems with sinus issues.  Increased stress related to this as well.    Past Medical History:  Diagnosis Date  . C. difficile colitis   . Coronary artery disease   . Depression   . Diabetes mellitus ([place])   . Diverticulitis   . Diverticulosis   . Environmental allergies   . GERD (gastroesophageal reflux disease)   . Goiter    intrathoracic, s/p benign biopsy (Dr Harlow Asa)  . Hypercholesterolemia   . Hypertension   . Osteoarthritis    cervical spine, lumbar spine  . Paroxysmal atrial fibrillation ([facility])   . PONV (postoperative nausea and vomiting)   . Sleep apnea   . Stroke ([place])   . TIA (transient ischemic attack)    Past Surgical History:  Procedure Laterality Date  . BACK SURGERY  8/08   s/p fusion of L4-L5  . capsule endoscopy    . CATARACT EXTRACTION  2011   Dr. Herbert Deaner  . [place] SURGERY  2002  . COLONOSCOPY WITH PROPOFOL N/A 07/20/2017   Procedure: COLONOSCOPY WITH PROPOFOL;  Surgeon: Manya Silvas, MD;  Location: [facility] ENDOSCOPY;  Service: Endoscopy;  Laterality: N/A;  . EYE SURGERY     cataract bilateral 10/1999  . eyelid reduction     Dr. Carlis Abbott  . INGUINAL HERNIA REPAIR  1991   Dr, Tamala Julian  . NOSE SURGERY     turbinate reduction  . SEPTOPLASTY  1975  . SHOULDER SURGERY  2000   rotator cuff   Family History  Problem Relation Age of Onset  . Congestive Heart Failure Father   . Heart disease Father        myocardial infarction  . Rheumatic fever Father        valvular disease  . Heart disease Mother        s/p CABG (age 72)  . Kidney disease Sister   . Colon cancer Neg Hx   . Prostate cancer Neg Hx    Social History   Socioeconomic History  . Marital status: Married    Spouse name: Not on file  . Number of children: 2  . Years of education: Not on file  . Highest education level: Not on file  Occupational History  . Occupation: retired Pharmacist, hospital  Tobacco Use  .  Smoking status: Former Research scientist (life sciences)  . Smokeless tobacco: Never Used  Vaping Use  . Vaping Use: Never used  Substance and Sexual Activity  . Alcohol use: Yes    Alcohol/week: 0.0 standard drinks    Comment: occasional  . Drug use: No  . Sexual activity: Not Currently  Other Topics Concern  . Not on file  Social History Narrative  . Not on file   Social Determinants of Health   Financial Resource Strain: Low Risk   . Difficulty of Paying Living Expenses: Not hard at all  Food Insecurity: No Food Insecurity  . Worried About Charity fundraiser in the Last Year: Never true  . Ran Out of Food in the Last Year: Never true  Transportation Needs: No Transportation Needs  . Lack of Transportation (Medical): No  . Lack of Transportation (Non-Medical): No  Physical Activity: Sufficiently Active  . Days of Exercise per Week: 5 days  . Minutes of Exercise per Session: 60 min  Stress:  No Stress Concern Present  . Feeling of Stress : Not at all  Social Connections: Unknown  . Frequency of Communication with Friends and Family: Not on file  . Frequency of Social Gatherings with Friends and Family: Not on file  . Attends Religious Services: Not on file  . Active Member of Clubs or Organizations: Not on file  . Attends Archivist Meetings: Not on file  . Marital Status: Married    Outpatient Encounter Medications as of 09/17/2020  Medication Sig  . [EXPIRED] acyclovir (ZOVIRAX) 400 MG tablet Take 1 tablet (400 mg total) by mouth 5 (five) times daily for 7 days.  Marland Kitchen amLODipine (NORVASC) 10 MG tablet TAKE 1 TABLET BY MOUTH ONCE DAILY  . Ascorbic Acid (VITAMIN C) 100 MG tablet Take 100 mg by mouth daily.  Marland Kitchen aspirin 81 MG tablet Take 81 mg by mouth daily.  Marland Kitchen atorvastatin (LIPITOR) 10 MG tablet Take 1 tablet (10 mg total) by mouth daily.  . Azelastine-Fluticasone (DYMISTA NA) Place 2 puffs into the nose daily.  . blood glucose meter kit and supplies Dispense based on patient and insurance preference. Use up to four times daily as directed. (FOR ICD-10 E10.9, E11.9).  Marland Kitchen glucose blood (ACCU-CHEK AVIVA PLUS) test strip TEST SUGAR TWICE DAILY  . hydrochlorothiazide (HYDRODIURIL) 50 MG tablet Take 1 tablet (50 mg total) by mouth daily.  Marland Kitchen levocetirizine (XYZAL) 5 MG tablet Take 1 tablet (5 mg total) by mouth every evening.  Marland Kitchen lisinopril (ZESTRIL) 40 MG tablet TAKE 1 TABLET BY MOUTH ONCE DAILY  . metFORMIN (GLUCOPHAGE) 1000 MG tablet TAKE 1 TABLET BY MOUTH TWICE DAILY WITH MEALS  . metoprolol succinate (TOPROL-XL) 25 MG 24 hr tablet Take 1 tablet (25 mg total) by mouth 2 (two) times daily.  . montelukast (SINGULAIR) 10 MG tablet Take 1 tablet (10 mg total) by mouth at bedtime.  . Multiple Vitamin (MULTIVITAMIN) tablet Take 1 tablet by mouth daily.  . Oxycodone HCl 10 MG TABS oxycodone 10 mg tablet  Take 1 tablet 4 times a day by oral route as needed.  . pantoprazole  (PROTONIX) 40 MG tablet TAKE 1 TABLET BY MOUTH ONCE DAILY  . pioglitazone (ACTOS) 15 MG tablet Take 1 tablet (15 mg total) by mouth daily.  . [EXPIRED] predniSONE (DELTASONE) 20 MG tablet Take 3 tablets (60 mg total) by mouth daily with breakfast for 5 days.  . tamsulosin (FLOMAX) 0.4 MG CAPS capsule Take 0.4 mg by mouth daily with breakfast.   .  testosterone cypionate (DEPOTESTOTERONE CYPIONATE) 200 MG/ML injection Inject 200 mg into the muscle every 14 (fourteen) days.  . traZODone (DESYREL) 50 MG tablet Take 1.5 tablets (75 mg total) by mouth at bedtime.  . TRULICITY 8.10 FB/5.1WC SOPN INJECT 0.75MG SUBQ ONCE A WEEK  . [DISCONTINUED] ALPRAZolam (XANAX) 0.25 MG tablet TAKE 1 TABLET BY MOUTH ONCE DAILY AS NEEDED  . [DISCONTINUED] sertraline (ZOLOFT) 50 MG tablet TAKE 1 TABLET BY MOUTH ONCE DAILY  . ALPRAZolam (XANAX) 0.25 MG tablet Take 1 tablet (0.25 mg total) by mouth 2 (two) times daily as needed.  . sertraline (ZOLOFT) 50 MG tablet Take 1 1/2 tablet q day  . [DISCONTINUED] doxycycline (VIBRAMYCIN) 100 MG capsule Take 1 capsule (100 mg total) by mouth 2 (two) times daily. (Patient not taking: Reported on 09/17/2020)  . [DISCONTINUED] HYDROcodone-acetaminophen (NORCO) 10-325 MG tablet hydrocodone 10 mg-acetaminophen 325 mg tablet  Take 1 tablet 3 times a day by oral route as needed for pain. (Patient not taking: Reported on 09/17/2020)   No facility-administered encounter medications on file as of 09/17/2020.    Review of Systems  Constitutional: Negative for appetite change and unexpected weight change.  HENT:       Has had issues with increased sinus congestion.    Respiratory: Negative for cough, chest tightness and shortness of breath.   Cardiovascular: Negative for chest pain, palpitations and leg swelling.  Gastrointestinal: Negative for abdominal pain, diarrhea, nausea and vomiting.  Genitourinary: Negative for difficulty urinating and dysuria.  Musculoskeletal: Positive for back pain.  Negative for joint swelling and myalgias.  Skin: Negative for color change and rash.  Neurological: Negative for dizziness, light-headedness and headaches.  Psychiatric/Behavioral: Negative for agitation and suicidal ideas.       Increased stress and some depression as outlined.        Objective:    Physical Exam Vitals reviewed.  Constitutional:      General: He is not in acute distress.    Appearance: He is well-developed.  HENT:     Head: Normocephalic and atraumatic.     Right Ear: External ear normal.     Left Ear: External ear normal.  Eyes:     General: No scleral icterus.       Right eye: No discharge.        Left eye: No discharge.     Conjunctiva/sclera: Conjunctivae normal.  Cardiovascular:     Rate and Rhythm: Normal rate and regular rhythm.  Pulmonary:     Effort: Pulmonary effort is normal. No respiratory distress.     Breath sounds: Normal breath sounds.  Abdominal:     General: Bowel sounds are normal.     Palpations: Abdomen is soft.     Tenderness: There is no abdominal tenderness.  Musculoskeletal:        General: No swelling or tenderness.  Skin:    Findings: No erythema or rash.  Neurological:     Mental Status: He is alert.     Comments: Bells palsy - right.    Psychiatric:        Mood and Affect: Mood normal.        Behavior: Behavior normal.     BP (!) 146/70 (BP Location: Left Arm, Patient Position: Sitting, Cuff Size: Large)   Pulse 67   Temp 98.2 F (36.8 C) (Oral)   Ht _0  (1.753 m)   Wt 200 lb (90.7 kg)   SpO2 97%   BMI 29.53 kg/m  Wt Readings  from Last 3 Encounters:  09/17/20 200 lb (90.7 kg)  09/14/20 216 lb 0.8 oz (98 kg)  08/03/20 200 lb (90.7 kg)     Lab Results  Component Value Date   WBC 13.4 (H) 09/14/2020   HGB 14.9 09/14/2020   HCT 45.6 09/14/2020   PLT 297 09/14/2020   GLUCOSE 161 (H) 09/14/2020   CHOL 145 06/04/2020   TRIG 309.0 (H) 06/04/2020   HDL 37.10 (L) 06/04/2020   LDLDIRECT 76.0 06/04/2020    LDLCALC 58 11/21/2013   ALT 24 09/14/2020   AST 32 09/14/2020   NA 138 09/14/2020   K 4.0 09/14/2020   CL 100 09/14/2020   CREATININE 0.98 09/14/2020   BUN 22 09/14/2020   CO2 24 09/14/2020   TSH 0.49 02/24/2020   PSA 0.82 06/10/2019   INR 0.9 09/14/2020   HGBA1C 6.7 (H) 06/04/2020   MICROALBUR 1.5 10/18/2019    MR BRAIN WO CONTRAST  Result Date: 09/14/2020 CLINICAL DATA:  Acute neurologic deficit.  Right facial numbness. EXAM: MRI HEAD WITHOUT CONTRAST TECHNIQUE: Multiplanar, multiecho pulse sequences of the brain and surrounding structures were obtained without intravenous contrast. COMPARISON:  None. FINDINGS: Brain: No acute infarct, mass effect or extra-axial collection. No acute or chronic hemorrhage. There is multifocal hyperintense T2-weighted signal within the white matter. Generalized volume loss without a clear lobar predilection. The midline structures are normal. There is no cerebellopontine angle mass. The cochleae and semicircular canals are normal. No focal abnormality along the course of the 7th and 8th cranial nerves. Normal porus acusticus and vestibular aqueduct bilaterally. Vascular: Major flow voids are preserved. Skull and upper cervical spine: Normal calvarium and skull base. Visualized upper cervical spine and soft tissues are normal. Sinuses/Orbits:Right maxillary sinus retention cyst. Left sphenoid sinus opacification with moderate mucosal thickening. Normal orbits. IMPRESSION: 1. No acute intracranial abnormality. 2. Findings of chronic ischemic microangiopathy and generalized volume loss. Electronically Signed   By: Ulyses Jarred M.D.   On: 09/14/2020 23:20   CT HEAD CODE STROKE WO CONTRAST  Result Date: 09/14/2020 CLINICAL DATA:  Code stroke.  Right facial numbness EXAM: CT HEAD WITHOUT CONTRAST TECHNIQUE: Contiguous axial images were obtained from the base of the skull through the vertex without intravenous contrast. COMPARISON:  None. FINDINGS: Brain: There is no  mass, hemorrhage or extra-axial collection. The size and configuration of the ventricles and extra-axial CSF spaces are normal. The brain parenchyma is normal, without evidence of acute or chronic infarction. Vascular: No abnormal hyperdensity of the major intracranial arteries or dural venous sinuses. No intracranial atherosclerosis. Skull: The visualized skull base, calvarium and extracranial soft tissues are normal. Sinuses/Orbits: No fluid levels or advanced mucosal thickening of the visualized paranasal sinuses. No mastoid or middle ear effusion. The orbits are normal. ASPECTS [facility] Stroke Program Early CT Score) - Ganglionic level infarction (caudate, lentiform nuclei, internal capsule, insula, M1-M3 cortex): 7 - Supraganglionic infarction (M4-M6 cortex): 3 Total score (0-10 with 10 being normal): 10 IMPRESSION: 1. Normal head CT. 2. ASPECTS is 10. These results were called by telephone at the time of interpretation on 09/14/2020 at 8:27 pm to provider [facility] , who verbally acknowledged these results. Electronically Signed   By: Ulyses Jarred M.D.   On: 09/14/2020 20:27       Assessment & Plan:   Problem List Items Addressed This Visit    Anemia, iron deficiency    Follow cbc.       Back pain    Increased pain as  outlined.  Seeing ortho. Planning for injection 10/09/20.        Relevant Medications   Oxycodone HCl 10 MG TABS   Diabetes mellitus ([place])    On trulicity and metformin.  Low carb diet and exercise.  Discussed side effect of prednisone - elevation of blood glucose.  Follow met b and a1c.       GERD (gastroesophageal reflux disease)    No upper symptoms reported.  Continue protonix.       Hypercholesterolemia    Continue lipitor.  Low cholesterol diet and exercise.  Follow lipid panel and liver function tests.        Hypertension    Continue hctz, metoprolol, lisinopril and amlodipine.  Pressure as outlined.  Treat the increased stress. Follow. Hold on making any  adjustments in blood pressure medication.  Follow metabolic panel.       Obstructive sleep apnea    Continue cpap.       Right hip pain    Right back/hip and thigh pain.  Has seen ortho - Dr Nelva Bush.  Planning for injection 10/09/20.        Stress    Increased stress as outlined.  Increased zoloft.  Request to have xanax refilled.  Follow closely.            Einar Pheasant, MD

## 2020-09-20 DIAGNOSIS — M5416 Radiculopathy, lumbar region: Secondary | ICD-10-CM | POA: Diagnosis not present

## 2020-09-20 DIAGNOSIS — I10 Essential (primary) hypertension: Secondary | ICD-10-CM | POA: Diagnosis not present

## 2020-09-23 ENCOUNTER — Encounter: Payer: Self-pay | Admitting: Internal Medicine

## 2020-09-23 NOTE — Assessment & Plan Note (Signed)
On trulicity and metformin.  Low carb diet and exercise.  Discussed side effect of prednisone - elevation of blood glucose.  Follow met b and a1c.

## 2020-09-23 NOTE — Assessment & Plan Note (Signed)
Continue lipitor.  Low cholesterol diet and exercise.  Follow lipid panel and liver function tests.   

## 2020-09-23 NOTE — Assessment & Plan Note (Signed)
Increased stress as outlined.  Increased zoloft.  Request to have xanax refilled.  Follow closely.

## 2020-09-23 NOTE — Assessment & Plan Note (Signed)
Follow cbc.  

## 2020-09-23 NOTE — Assessment & Plan Note (Signed)
Right back/hip and thigh pain.  Has seen ortho - Dr Nelva Bush.  Planning for injection 10/09/20.

## 2020-09-23 NOTE — Assessment & Plan Note (Signed)
Increased pain as outlined.  Seeing ortho. Planning for injection 10/09/20.

## 2020-09-23 NOTE — Assessment & Plan Note (Signed)
No upper symptoms reported.  Continue protonix.  

## 2020-09-23 NOTE — Assessment & Plan Note (Signed)
Continue hctz, metoprolol, lisinopril and amlodipine.  Pressure as outlined.  Treat the increased stress. Follow. Hold on making any adjustments in blood pressure medication.  Follow metabolic panel.

## 2020-09-23 NOTE — Assessment & Plan Note (Signed)
Continue cpap.  

## 2020-09-26 ENCOUNTER — Other Ambulatory Visit: Payer: Self-pay | Admitting: Neurological Surgery

## 2020-09-26 DIAGNOSIS — M48061 Spinal stenosis, lumbar region without neurogenic claudication: Secondary | ICD-10-CM

## 2020-09-26 DIAGNOSIS — G510 Bell's palsy: Secondary | ICD-10-CM | POA: Diagnosis not present

## 2020-09-26 DIAGNOSIS — J0100 Acute maxillary sinusitis, unspecified: Secondary | ICD-10-CM | POA: Diagnosis not present

## 2020-09-28 ENCOUNTER — Other Ambulatory Visit: Payer: Self-pay | Admitting: Internal Medicine

## 2020-09-28 NOTE — Telephone Encounter (Signed)
RX Refill:xanax Last Seen:09-17-20 Last ordered:09-17-20

## 2020-09-29 ENCOUNTER — Telehealth: Payer: Self-pay | Admitting: Internal Medicine

## 2020-09-29 NOTE — Telephone Encounter (Signed)
Received request to refill xanax.  He just refilled xanax #[phone] with no refills on 5.20/22.  Hold on additional refill currently.

## 2020-09-29 NOTE — Telephone Encounter (Signed)
Opened in error

## 2020-10-01 NOTE — Telephone Encounter (Signed)
Patient doesn't need right now. Pt confirmed he picked up rx beginning of May

## 2020-10-02 ENCOUNTER — Other Ambulatory Visit: Payer: Self-pay

## 2020-10-02 ENCOUNTER — Other Ambulatory Visit (INDEPENDENT_AMBULATORY_CARE_PROVIDER_SITE_OTHER): Payer: Medicare PPO

## 2020-10-02 DIAGNOSIS — E7800 Pure hypercholesterolemia, unspecified: Secondary | ICD-10-CM | POA: Diagnosis not present

## 2020-10-02 DIAGNOSIS — E1159 Type 2 diabetes mellitus with other circulatory complications: Secondary | ICD-10-CM | POA: Diagnosis not present

## 2020-10-02 LAB — HEPATIC FUNCTION PANEL
ALT: 20 U/L (ref 0–53)
AST: 15 U/L (ref 0–37)
Albumin: 4 g/dL (ref 3.5–5.2)
Alkaline Phosphatase: 38 U/L — ABNORMAL LOW (ref 39–117)
Bilirubin, Direct: 0.2 mg/dL (ref 0.0–0.3)
Total Bilirubin: 0.8 mg/dL (ref 0.2–1.2)
Total Protein: 6.2 g/dL (ref 6.0–8.3)

## 2020-10-02 LAB — BASIC METABOLIC PANEL
BUN: 19 mg/dL (ref 6–23)
CO2: 27 mEq/L (ref 19–32)
Calcium: 9.9 mg/dL (ref 8.4–10.5)
Chloride: 99 mEq/L (ref 96–112)
Creatinine, Ser: 0.85 mg/dL (ref 0.40–1.50)
GFR: 84.51 mL/min (ref >60.00)
Glucose, Bld: 148 mg/dL — ABNORMAL HIGH (ref 70–99)
Potassium: 3.9 mEq/L (ref 3.5–5.1)
Sodium: 139 mEq/L (ref 135–145)

## 2020-10-02 LAB — LIPID PANEL
Cholesterol: 154 mg/dL (ref 0–200)
HDL: 34.9 mg/dL — ABNORMAL LOW (ref >39.00)
NonHDL: 118.95
Total CHOL/HDL Ratio: 4
Triglycerides: 296 mg/dL — ABNORMAL HIGH (ref 0.0–149.0)
VLDL: 59.2 mg/dL — ABNORMAL HIGH (ref 0.0–40.0)

## 2020-10-02 LAB — LDL CHOLESTEROL, DIRECT: Direct LDL: 99 mg/dL

## 2020-10-02 LAB — HEMOGLOBIN A1C: Hgb A1c MFr Bld: 7.3 % — ABNORMAL HIGH (ref 4.6–6.5)

## 2020-10-04 ENCOUNTER — Ambulatory Visit: Payer: Medicare PPO | Admitting: Internal Medicine

## 2020-10-04 ENCOUNTER — Other Ambulatory Visit: Payer: Self-pay

## 2020-10-04 ENCOUNTER — Encounter: Payer: Self-pay | Admitting: Internal Medicine

## 2020-10-04 VITALS — BP 156/86 | HR 75 | Temp 98.1°F | Resp 16 | Ht 69.0 in | Wt 197.0 lb

## 2020-10-04 DIAGNOSIS — K219 Gastro-esophageal reflux disease without esophagitis: Secondary | ICD-10-CM | POA: Diagnosis not present

## 2020-10-04 DIAGNOSIS — E7800 Pure hypercholesterolemia, unspecified: Secondary | ICD-10-CM | POA: Diagnosis not present

## 2020-10-04 DIAGNOSIS — I10 Essential (primary) hypertension: Secondary | ICD-10-CM

## 2020-10-04 DIAGNOSIS — G4733 Obstructive sleep apnea (adult) (pediatric): Secondary | ICD-10-CM

## 2020-10-04 DIAGNOSIS — M545 Low back pain, unspecified: Secondary | ICD-10-CM | POA: Diagnosis not present

## 2020-10-04 DIAGNOSIS — E1159 Type 2 diabetes mellitus with other circulatory complications: Secondary | ICD-10-CM | POA: Diagnosis not present

## 2020-10-04 DIAGNOSIS — R32 Unspecified urinary incontinence: Secondary | ICD-10-CM

## 2020-10-04 DIAGNOSIS — F419 Anxiety disorder, unspecified: Secondary | ICD-10-CM

## 2020-10-04 DIAGNOSIS — D509 Iron deficiency anemia, unspecified: Secondary | ICD-10-CM

## 2020-10-04 DIAGNOSIS — I700 Atherosclerosis of aorta: Secondary | ICD-10-CM

## 2020-10-04 DIAGNOSIS — I2510 Atherosclerotic heart disease of native coronary artery without angina pectoris: Secondary | ICD-10-CM

## 2020-10-04 DIAGNOSIS — F439 Reaction to severe stress, unspecified: Secondary | ICD-10-CM

## 2020-10-04 MED ORDER — SERTRALINE HCL 100 MG PO TABS
100.0000 mg | ORAL_TABLET | Freq: Every day | ORAL | 1 refills | Status: DC
Start: 2020-10-04 — End: 2021-02-14

## 2020-10-04 MED ORDER — CARVEDILOL 6.25 MG PO TABS: 6.2500 mg | ORAL_TABLET | Freq: Two times a day (BID) | ORAL | 3 refills | Status: DC

## 2020-10-04 NOTE — Progress Notes (Addendum)
Patient ID: Ricardo Nash, male   DOB: Apr 23, 1946, 75 y.o.   MRN: [id number]   Subjective:    Patient ID: Ricardo Nash, male    DOB: 11-04-1945, 75 y.o.   MRN: [id number]  HPI This visit occurred during the SARS-CoV-2 public health emergency.  Safety protocols were in place, including screening questions prior to the visit, additional usage of staff PPE, and extensive cleaning of exam room while observing appropriate contact time as indicated for disinfecting solutions.  Patient here for a scheduled follow up.  Here to follow up regarding his blood sugar, blood pressure and cholesterol.  Seeing Dr Tami Ribas for recurring sinus infections.  Increased drainage.  Treated with levaquin.  Has f/u with Dr Tami Ribas next week. Also saw him for f/u Bell's Palsy.  Prednisone and valtrex extended.  Planning for lumbar procedure 10/15/20 - Neurosurgery.  He denies any chest pain or sob.  Exercises with no cardiac symptoms reported.  Riding bike - three miles with no problems.  Does report some bladder leakage.  Discussed f/u with urology.  Taking flomax.  Doing better on zoloft.  Is helping.  Still with increased anxiety.  Discussed increasing dose.    Past Medical History:  Diagnosis Date  . C. difficile colitis   . Coronary artery disease   . Depression   . Diabetes mellitus ([place])   . Diverticulitis   . Diverticulosis   . Dysrhythmia    PAF  . Environmental allergies   . GERD (gastroesophageal reflux disease)   . Goiter    intrathoracic, s/p benign biopsy (Dr Harlow Asa)  . Hypercholesterolemia   . Hypertension   . Osteoarthritis    cervical spine, lumbar spine  . Paroxysmal atrial fibrillation (HCC)   . Pneumonia   . PONV (postoperative nausea and vomiting)   . Sleep apnea    CPAP @ NIGHT  . Stroke ([place])   . TIA (transient ischemic attack)    Past Surgical History:  Procedure Laterality Date  . BACK SURGERY  8/08   s/p fusion of L4-L5  . capsule endoscopy    . CATARACT EXTRACTION   2011   Dr. Herbert Deaner  . [place] SURGERY  2002  . COLONOSCOPY WITH PROPOFOL N/A 07/20/2017   Procedure: COLONOSCOPY WITH PROPOFOL;  Surgeon: Manya Silvas, MD;  Location: [facility] ENDOSCOPY;  Service: Endoscopy;  Laterality: N/A;  . EYE SURGERY     cataract bilateral 10/1999  . eyelid reduction     Dr. Carlis Abbott  . INGUINAL HERNIA REPAIR  1991   Dr, Tamala Julian  . NOSE SURGERY     turbinate reduction  . SEPTOPLASTY  1975  . SHOULDER SURGERY  2000   rotator cuff   Family History  Problem Relation Age of Onset  . Congestive Heart Failure Father   . Heart disease Father        myocardial infarction  . Rheumatic fever Father        valvular disease  . Heart disease Mother        s/p CABG (age 31)  . Kidney disease Sister   . Colon cancer Neg Hx   . Prostate cancer Neg Hx    Social History   Socioeconomic History  . Marital status: Married    Spouse name: Not on file  . Number of children: 2  . Years of education: Not on file  . Highest education level: Not on file  Occupational History  . Occupation: retired Pharmacist, hospital  Tobacco Use  .  Smoking status: Former Research scientist (life sciences)  . Smokeless tobacco: Never Used  Vaping Use  . Vaping Use: Never used  Substance and Sexual Activity  . Alcohol use: Yes    Alcohol/week: 0.0 standard drinks    Comment: occasional  . Drug use: No  . Sexual activity: Not Currently  Other Topics Concern  . Not on file  Social History Narrative  . Not on file   Social Determinants of Health   Financial Resource Strain: Low Risk   . Difficulty of Paying Living Expenses: Not hard at all  Food Insecurity: No Food Insecurity  . Worried About Charity fundraiser in the Last Year: Never true  . Ran Out of Food in the Last Year: Never true  Transportation Needs: No Transportation Needs  . Lack of Transportation (Medical): No  . Lack of Transportation (Non-Medical): No  Physical Activity: Sufficiently Active  . Days of Exercise per Week: 5 days  . Minutes of  Exercise per Session: 60 min  Stress: No Stress Concern Present  . Feeling of Stress : Not at all  Social Connections: Unknown  . Frequency of Communication with Friends and Family: Not on file  . Frequency of Social Gatherings with Friends and Family: Not on file  . Attends Religious Services: Not on file  . Active Member of Clubs or Organizations: Not on file  . Attends Archivist Meetings: Not on file  . Marital Status: Married    Outpatient Encounter Medications as of 10/04/2020  Medication Sig  . carvedilol (COREG) 6.25 MG tablet Take 1 tablet (6.25 mg total) by mouth 2 (two) times daily with a meal.  . sertraline (ZOLOFT) 100 MG tablet Take 1 tablet (100 mg total) by mouth daily.  Marland Kitchen ALPRAZolam (XANAX) 0.25 MG tablet Take 1 tablet (0.25 mg total) by mouth 2 (two) times daily as needed. (Patient taking differently: Take 0.25 mg by mouth 2 (two) times daily as needed for anxiety.)  . amLODipine (NORVASC) 10 MG tablet TAKE 1 TABLET BY MOUTH ONCE DAILY (Patient taking differently: Take 10 mg by mouth daily in the afternoon.)  . Ascorbic Acid (VITAMIN C) 100 MG tablet Take 100 mg by mouth in the morning.  Marland Kitchen aspirin EC 81 MG tablet Take 81 mg by mouth every evening. Swallow whole.  Marland Kitchen atorvastatin (LIPITOR) 10 MG tablet Take 1 tablet (10 mg total) by mouth daily. (Patient taking differently: Take 10 mg by mouth at bedtime.)  . Azelastine-Fluticasone (DYMISTA NA) Place 1 spray into the nose in the morning and at bedtime.  . blood glucose meter kit and supplies Dispense based on patient and insurance preference. Use up to four times daily as directed. (FOR ICD-10 E10.9, E11.9).  Marland Kitchen glucose blood (ACCU-CHEK AVIVA PLUS) test strip TEST SUGAR TWICE DAILY  . hydrochlorothiazide (HYDRODIURIL) 50 MG tablet Take 1 tablet (50 mg total) by mouth daily. (Patient taking differently: Take 50 mg by mouth in the morning.)  . levocetirizine (XYZAL) 5 MG tablet Take 1 tablet (5 mg total) by mouth every  evening.  Marland Kitchen levofloxacin (LEVAQUIN) 750 MG tablet Take 750 mg by mouth daily.  . metFORMIN (GLUCOPHAGE) 1000 MG tablet TAKE 1 TABLET BY MOUTH TWICE DAILY WITH MEALS (Patient taking differently: Take 1,000 mg by mouth 2 (two) times daily with a meal.)  . montelukast (SINGULAIR) 10 MG tablet Take 1 tablet (10 mg total) by mouth at bedtime.  . Multiple Vitamin (MULTIVITAMIN WITH MINERALS) TABS tablet Take 1 tablet by mouth in the  morning.  . Oxycodone HCl 10 MG TABS Take 10 mg by mouth every 6 (six) hours as needed (pain.).  Marland Kitchen pantoprazole (PROTONIX) 40 MG tablet TAKE 1 TABLET BY MOUTH ONCE DAILY (Patient taking differently: Take 40 mg by mouth in the morning.)  . pioglitazone (ACTOS) 15 MG tablet Take 1 tablet (15 mg total) by mouth daily. (Patient taking differently: Take 15 mg by mouth in the morning.)  . predniSONE (DELTASONE) 10 MG tablet Take 10-60 mg by mouth See admin instructions. Day 1 take 6 tablets Day 2 take 6 tablets Day 3 take 5 tablets Day 4 take 5 tablets Day 5 take 4 tablets Day 6 take 4 tablets Day 7 take 3 tablets Day 8 take 3 tablets Day 9 take 2 tablets Day 10 take 2 tablets Day 11 take 1 tablet Day 12 take 1 tablet  . tamsulosin (FLOMAX) 0.4 MG CAPS capsule Take 0.4 mg by mouth daily with breakfast.   . testosterone cypionate (DEPOTESTOTERONE CYPIONATE) 200 MG/ML injection Inject 200 mg into the muscle every 14 (fourteen) days.  . traZODone (DESYREL) 50 MG tablet Take 1.5 tablets (75 mg total) by mouth at bedtime. (Patient taking differently: Take 75 mg by mouth at bedtime as needed for sleep.)  . TRULICITY 5.63 OV/5.6EP SOPN INJECT 0.75MG SUBQ ONCE A WEEK (Patient taking differently: Inject 0.75 mg into the skin every Sunday.)  . valACYclovir (VALTREX) 1000 MG tablet Take 1,000 mg by mouth 3 (three) times daily.  . [DISCONTINUED] lisinopril (ZESTRIL) 40 MG tablet TAKE 1 TABLET BY MOUTH ONCE DAILY (Patient taking differently: Take 40 mg by mouth in the morning.)  . [DISCONTINUED]  metoprolol succinate (TOPROL-XL) 25 MG 24 hr tablet Take 1 tablet (25 mg total) by mouth 2 (two) times daily.  . [DISCONTINUED] sertraline (ZOLOFT) 50 MG tablet Take 1 1/2 tablet q day (Patient taking differently: Take 75 mg by mouth in the morning.)   No facility-administered encounter medications on file as of 10/04/2020.    Review of Systems  Constitutional: Negative for appetite change and unexpected weight change.  HENT: Positive for congestion. Negative for sinus pressure.   Respiratory: Negative for cough, chest tightness and shortness of breath.   Cardiovascular: Negative for chest pain, palpitations and leg swelling.  Gastrointestinal: Negative for abdominal pain, diarrhea, nausea and vomiting.  Genitourinary: Negative for difficulty urinating and dysuria.  Musculoskeletal: Negative for joint swelling and myalgias.       Persistent back pain as outlined.   Skin: Negative for color change and rash.  Neurological: Negative for dizziness, light-headedness and headaches.  Psychiatric/Behavioral: Negative for agitation and dysphoric mood.       Objective:    Physical Exam Vitals reviewed.  Constitutional:      General: He is not in acute distress.    Appearance: Normal appearance. He is well-developed.  HENT:     Head: Normocephalic and atraumatic.     Right Ear: External ear normal.     Left Ear: External ear normal.  Eyes:     General: No scleral icterus.       Right eye: No discharge.        Left eye: No discharge.     Conjunctiva/sclera: Conjunctivae normal.  Cardiovascular:     Rate and Rhythm: Normal rate and regular rhythm.  Pulmonary:     Effort: Pulmonary effort is normal. No respiratory distress.     Breath sounds: Normal breath sounds.  Abdominal:     General: Bowel sounds are normal.  Palpations: Abdomen is soft.     Tenderness: There is no abdominal tenderness.  Musculoskeletal:        General: No swelling or tenderness.     Cervical back: Neck  supple. No tenderness.  Lymphadenopathy:     Cervical: No cervical adenopathy.  Skin:    Findings: No erythema or rash.  Neurological:     Mental Status: He is alert.  Psychiatric:        Mood and Affect: Mood normal.        Behavior: Behavior normal.     BP (!) 156/86   Pulse 75   Temp 98.1 F (36.7 C)   Resp 16   Ht _0  (1.753 m)   Wt 197 lb (89.4 kg)   SpO2 97%   BMI 29.09 kg/m  Wt Readings from Last 3 Encounters:  10/11/20 195 lb 9.6 oz (88.7 kg)  10/04/20 197 lb (89.4 kg)  09/17/20 200 lb (90.7 kg)     Lab Results  Component Value Date   WBC 8.4 10/11/2020   HGB 14.3 10/11/2020   HCT 45.3 10/11/2020   PLT 219 10/11/2020   GLUCOSE 148 (H) 10/02/2020   CHOL 154 10/02/2020   TRIG 296.0 (H) 10/02/2020   HDL 34.90 (L) 10/02/2020   LDLDIRECT 99.0 10/02/2020   LDLCALC 58 11/21/2013   ALT 20 10/02/2020   AST 15 10/02/2020   NA 139 10/02/2020   K 3.9 10/02/2020   CL 99 10/02/2020   CREATININE 0.85 10/02/2020   BUN 19 10/02/2020   CO2 27 10/02/2020   TSH 0.49 02/24/2020   PSA 0.82 06/10/2019   INR 0.9 10/11/2020   HGBA1C 7.3 (H) 10/02/2020   MICROALBUR 1.5 10/18/2019    MR BRAIN WO CONTRAST  Result Date: 09/14/2020 CLINICAL DATA:  Acute neurologic deficit.  Right facial numbness. EXAM: MRI HEAD WITHOUT CONTRAST TECHNIQUE: Multiplanar, multiecho pulse sequences of the brain and surrounding structures were obtained without intravenous contrast. COMPARISON:  None. FINDINGS: Brain: No acute infarct, mass effect or extra-axial collection. No acute or chronic hemorrhage. There is multifocal hyperintense T2-weighted signal within the white matter. Generalized volume loss without a clear lobar predilection. The midline structures are normal. There is no cerebellopontine angle mass. The cochleae and semicircular canals are normal. No focal abnormality along the course of the 7th and 8th cranial nerves. Normal porus acusticus and vestibular aqueduct bilaterally. Vascular:  Major flow voids are preserved. Skull and upper cervical spine: Normal calvarium and skull base. Visualized upper cervical spine and soft tissues are normal. Sinuses/Orbits:Right maxillary sinus retention cyst. Left sphenoid sinus opacification with moderate mucosal thickening. Normal orbits. IMPRESSION: 1. No acute intracranial abnormality. 2. Findings of chronic ischemic microangiopathy and generalized volume loss. Electronically Signed   By: Ulyses Jarred M.D.   On: 09/14/2020 23:20   CT HEAD CODE STROKE WO CONTRAST  Result Date: 09/14/2020 CLINICAL DATA:  Code stroke.  Right facial numbness EXAM: CT HEAD WITHOUT CONTRAST TECHNIQUE: Contiguous axial images were obtained from the base of the skull through the vertex without intravenous contrast. COMPARISON:  None. FINDINGS: Brain: There is no mass, hemorrhage or extra-axial collection. The size and configuration of the ventricles and extra-axial CSF spaces are normal. The brain parenchyma is normal, without evidence of acute or chronic infarction. Vascular: No abnormal hyperdensity of the major intracranial arteries or dural venous sinuses. No intracranial atherosclerosis. Skull: The visualized skull base, calvarium and extracranial soft tissues are normal. Sinuses/Orbits: No fluid levels or advanced mucosal thickening  of the visualized paranasal sinuses. No mastoid or middle ear effusion. The orbits are normal. ASPECTS [facility] Stroke Program Early CT Score) - Ganglionic level infarction (caudate, lentiform nuclei, internal capsule, insula, M1-M3 cortex): 7 - Supraganglionic infarction (M4-M6 cortex): 3 Total score (0-10 with 10 being normal): 10 IMPRESSION: 1. Normal head CT. 2. ASPECTS is 10. These results were called by telephone at the time of interpretation on 09/14/2020 at 8:27 pm to provider [facility] , who verbally acknowledged these results. Electronically Signed   By: Ulyses Jarred M.D.   On: 09/14/2020 20:27       Assessment & Plan:    Problem List Items Addressed This Visit    Anemia, iron deficiency    Follow cbc.       Anxiety    On zoloft.  Increase dose.  Follow.  Doing better.        Relevant Medications   sertraline (ZOLOFT) 100 MG tablet   Aortic atherosclerosis (HCC)    Continues on lipitor.       Relevant Medications   carvedilol (COREG) 6.25 MG tablet   Back pain    Increased back pain.  Planning for back procedure 10/15/20.        CAD (coronary artery disease), native coronary artery    No chest pain or sob with increased exercise - riding bike 3 miles with no pain or increased sob.  Continue coreg, statin.  Follow.       Relevant Medications   carvedilol (COREG) 6.25 MG tablet   Diabetes mellitus ([place])    Continues on trulicity and metformin. Will need to hold metformin around surgery.  Follow sugars.  Follow metabolic panel and T3M.       GERD (gastroesophageal reflux disease)    No upper symptoms reported.  Continue protonix.       Hypercholesterolemia    Continue lipitor.  Low cholesterol diet and exercise.  Follow lipid panel and liver function tests.        Relevant Medications   carvedilol (COREG) 6.25 MG tablet   Hypertension    Continues on hctz, metoprolol, lisinopril and amlodipine.  Pressure running a little higher lately.  Feel related to increased pain.  Discussed.  Hold on changing medication.  Follow pressures.  Follow metabolic panel.  Will need close intra op and post op monitoring of heart rate and blood pressure to avoid extremes.        Relevant Medications   carvedilol (COREG) 6.25 MG tablet   Obstructive sleep apnea    Continue cpap.       Stress    Increased stress.  Doing better.  On zoloft.  Increase to 169m q day.  Follow.        Other Visit Diagnoses    Urinary incontinence, unspecified type    -  Primary   Relevant Orders   Ambulatory referral to Urology       CEinar Pheasant MD

## 2020-10-08 ENCOUNTER — Other Ambulatory Visit: Payer: Self-pay | Admitting: Internal Medicine

## 2020-10-09 DIAGNOSIS — G4733 Obstructive sleep apnea (adult) (pediatric): Secondary | ICD-10-CM | POA: Diagnosis not present

## 2020-10-10 NOTE — Pre-Procedure Instructions (Signed)
Surgical Instructions    Your procedure is scheduled on Monday June 6th.  Report to [facility] Main Entrance "A" at 05:30 A.M., then check in with the Admitting office.  Call this number if you have problems the morning of surgery:  [phone]   If you have any questions prior to your surgery date call [phone]: Open Monday-Friday 8am-4pm    Remember:  Do not eat or drink after midnight the night before your surgery     Take these medicines the morning of surgery with A SIP OF WATER   ALPRAZolam (XANAX)- If needed  amLODipine (NORVASC)  atorvastatin (LIPITOR)  Azelastine-Fluticasone (DYMISTA NA)  carvedilol (COREG)   Oxycodone HCl- If needed  pantoprazole (PROTONIX)  sertraline (ZOLOFT)   tamsulosin (FLOMAX)    As of today, STOP taking any Aspirin (unless otherwise instructed by your surgeon) Aleve, Naproxen, Ibuprofen, Motrin, Advil, Goody's, BC's, all herbal medications, fish oil, and all vitamins.  WHAT DO I DO ABOUT MY DIABETES MEDICATION?   Do not take oral diabetes medicines (pills) the morning of surgery. Do not take metFORMIN (GLUCOPHAGE) and Do not take pioglitazone (ACTOS) the morning of surgery.  . The day of surgery, do not take other diabetes injectables, including Byetta (exenatide), Bydureon (exenatide ER), Victoza (liraglutide), or Trulicity (dulaglutide).  . If your CBG is greater than 220 mg/dL, you may take  of your sliding scale (correction) dose of insulin.   HOW TO MANAGE YOUR DIABETES BEFORE AND AFTER SURGERY  Why is it important to control my blood sugar before and after surgery? . Improving blood sugar levels before and after surgery helps healing and can limit problems. . A way of improving blood sugar control is eating a healthy diet by: o  Eating less sugar and carbohydrates o  Increasing activity/exercise o  Talking with your doctor about reaching your blood sugar goals . High blood sugars (greater than 180 mg/dL) can raise your risk  of infections and slow your recovery, so you will need to focus on controlling your diabetes during the weeks before surgery. . Make sure that the doctor who takes care of your diabetes knows about your planned surgery including the date and location.  How do I manage my blood sugar before surgery? . Check your blood sugar at least 4 times a day, starting 2 days before surgery, to make sure that the level is not too high or low.  . Check your blood sugar the morning of your surgery when you wake up and every 2 hours until you get to the Short Stay unit.  o If your blood sugar is less than 70 mg/dL, you will need to treat for low blood sugar: - Do not take insulin. - Treat a low blood sugar (less than 70 mg/dL) with  cup of clear juice (cranberry or apple), 4 glucose tablets, OR glucose gel. - Recheck blood sugar in 15 minutes after treatment (to make sure it is greater than 70 mg/dL). If your blood sugar is not greater than 70 mg/dL on recheck, call [phone] for further instructions. . Report your blood sugar to the short stay nurse when you get to Short Stay.  . If you are admitted to the hospital after surgery: o Your blood sugar will be checked by the staff and you will probably be given insulin after surgery (instead of oral diabetes medicines) to make sure you have good blood sugar levels. o The goal for blood sugar control after surgery is 80-180 mg/dL.  Do NOT Smoke (Tobacco/Vaping) or drink Alcohol 24 hours prior to your procedure.  If you use a CPAP at night, you may bring all equipment for your overnight stay.   Contacts, glasses, piercing's, hearing aid's, dentures or partials may not be worn into surgery, please bring cases for these belongings.    For patients admitted to the hospital, discharge time will be determined by your treatment team.   Patients discharged the day of surgery will not be allowed to drive home, and someone needs to stay with  them for 24 hours.    Special instructions:   [place]- Preparing For Surgery  Before surgery, you can play an important role. Because skin is not sterile, your skin needs to be as free of germs as possible. You can reduce the number of germs on your skin by washing with CHG (chlorahexidine gluconate) Soap before surgery.  CHG is an antiseptic cleaner which kills germs and bonds with the skin to continue killing germs even after washing.    Oral Hygiene is also important to reduce your risk of infection.  Remember - BRUSH YOUR TEETH THE MORNING OF SURGERY WITH YOUR REGULAR TOOTHPASTE  Please do not use if you have an allergy to CHG or antibacterial soaps. If your skin becomes reddened/irritated stop using the CHG.  Do not shave (including legs and underarms) for at least 48 hours prior to first CHG shower. It is OK to shave your face.  Please follow these instructions carefully.   1. Shower the NIGHT BEFORE SURGERY and the MORNING OF SURGERY  2. If you chose to wash your hair, wash your hair first as usual with your normal shampoo.  3. After you shampoo, rinse your hair and body thoroughly to remove the shampoo.  4. Use CHG Soap as you would any other liquid soap. You can apply CHG directly to the skin and wash gently with a scrungie or a clean washcloth.   5. Apply the CHG Soap to your body ONLY FROM THE NECK DOWN.  Do not use on open wounds or open sores. Avoid contact with your eyes, ears, mouth and genitals (private parts). Wash Face and genitals (private parts)  with your normal soap.   6. Wash thoroughly, paying special attention to the area where your surgery will be performed.  7. Thoroughly rinse your body with warm water from the neck down.  8. DO NOT shower/wash with your normal soap after using and rinsing off the CHG Soap.  9. Pat yourself dry with a CLEAN TOWEL.  10. Wear CLEAN PAJAMAS to bed the night before surgery  11. Place CLEAN SHEETS on your bed the night  before your surgery  12. DO NOT SLEEP WITH PETS.   Day of Surgery: Shower with CHG soap. Do not wear jewelry. Do not wear lotions, powders, colognes, or deodorant. Do not shave 48 hours prior to surgery.  Men may shave face and neck. Do not bring valuables to the hospital. [facility] is not responsible for any belongings or valuables. Wear Clean/Comfortable clothing the morning of surgery Remember to brush your teeth WITH YOUR REGULAR TOOTHPASTE.   Please read over the following fact sheets that you were given.

## 2020-10-11 ENCOUNTER — Ambulatory Visit (HOSPITAL_COMMUNITY)
Admission: RE | Admit: 2020-10-11 | Discharge: 2020-10-11 | Disposition: A | Discharge status: Home / Self Care | Payer: Medicare PPO | Source: Ambulatory Visit | Attending: Neurological Surgery | Admitting: Neurological Surgery

## 2020-10-11 ENCOUNTER — Encounter (HOSPITAL_COMMUNITY)
Admission: RE | Admit: 2020-10-11 | Discharge: 2020-10-11 | Disposition: A | Discharge status: Home / Self Care | Payer: Medicare PPO | Source: Ambulatory Visit | Attending: Neurological Surgery | Admitting: Neurological Surgery

## 2020-10-11 ENCOUNTER — Other Ambulatory Visit: Payer: Self-pay

## 2020-10-11 ENCOUNTER — Encounter (HOSPITAL_COMMUNITY): Payer: Self-pay

## 2020-10-11 DIAGNOSIS — Z7984 Long term (current) use of oral hypoglycemic drugs: Secondary | ICD-10-CM | POA: Insufficient documentation

## 2020-10-11 DIAGNOSIS — I517 Cardiomegaly: Secondary | ICD-10-CM | POA: Diagnosis not present

## 2020-10-11 DIAGNOSIS — Z7982 Long term (current) use of aspirin: Secondary | ICD-10-CM | POA: Insufficient documentation

## 2020-10-11 DIAGNOSIS — I700 Atherosclerosis of aorta: Secondary | ICD-10-CM | POA: Insufficient documentation

## 2020-10-11 DIAGNOSIS — E118 Type 2 diabetes mellitus with unspecified complications: Secondary | ICD-10-CM | POA: Insufficient documentation

## 2020-10-11 DIAGNOSIS — I10 Essential (primary) hypertension: Secondary | ICD-10-CM | POA: Insufficient documentation

## 2020-10-11 DIAGNOSIS — I2510 Atherosclerotic heart disease of native coronary artery without angina pectoris: Secondary | ICD-10-CM | POA: Insufficient documentation

## 2020-10-11 DIAGNOSIS — Z7901 Long term (current) use of anticoagulants: Secondary | ICD-10-CM | POA: Diagnosis not present

## 2020-10-11 DIAGNOSIS — Z7902 Long term (current) use of antithrombotics/antiplatelets: Secondary | ICD-10-CM | POA: Insufficient documentation

## 2020-10-11 DIAGNOSIS — Z79899 Other long term (current) drug therapy: Secondary | ICD-10-CM | POA: Diagnosis not present

## 2020-10-11 DIAGNOSIS — G4733 Obstructive sleep apnea (adult) (pediatric): Secondary | ICD-10-CM | POA: Insufficient documentation

## 2020-10-11 DIAGNOSIS — M48061 Spinal stenosis, lumbar region without neurogenic claudication: Secondary | ICD-10-CM

## 2020-10-11 DIAGNOSIS — Z20822 Contact with and (suspected) exposure to covid-19: Secondary | ICD-10-CM | POA: Insufficient documentation

## 2020-10-11 DIAGNOSIS — Z01818 Encounter for other preprocedural examination: Secondary | ICD-10-CM | POA: Diagnosis not present

## 2020-10-11 DIAGNOSIS — Z01812 Encounter for preprocedural laboratory examination: Secondary | ICD-10-CM | POA: Insufficient documentation

## 2020-10-11 DIAGNOSIS — I480 Paroxysmal atrial fibrillation: Secondary | ICD-10-CM | POA: Diagnosis not present

## 2020-10-11 HISTORY — DX: Pneumonia, unspecified organism: J18.9

## 2020-10-11 HISTORY — DX: Cardiac arrhythmia, unspecified: I49.9

## 2020-10-11 LAB — CBC WITH DIFFERENTIAL/PLATELET
Abs Immature Granulocytes: 0.05 10*3/uL (ref 0.00–0.07)
Basophils Absolute: 0.1 10*3/uL (ref 0.0–0.1)
Basophils Relative: 1 %
Eosinophils Absolute: 0.1 10*3/uL (ref 0.0–0.5)
Eosinophils Relative: 1 %
HCT: 45.3 % (ref 39.0–52.0)
Hemoglobin: 14.3 g/dL (ref 13.0–17.0)
Immature Granulocytes: 1 %
Lymphocytes Relative: 18 %
Lymphs Abs: 1.5 10*3/uL (ref 0.7–4.0)
MCH: 29.5 pg (ref 26.0–34.0)
MCHC: 31.6 g/dL (ref 30.0–36.0)
MCV: 93.4 fL (ref 80.0–100.0)
Monocytes Absolute: 0.5 10*3/uL (ref 0.1–1.0)
Monocytes Relative: 6 %
Neutro Abs: 6.2 10*3/uL (ref 1.7–7.7)
Neutrophils Relative %: 73 %
Platelets: 219 10*3/uL (ref 150–400)
RBC: 4.85 MIL/uL (ref 4.22–5.81)
RDW: 15.9 % — ABNORMAL HIGH (ref 11.5–15.5)
WBC: 8.4 10*3/uL (ref 4.0–10.5)
nRBC: 0 % (ref 0.0–0.2)

## 2020-10-11 LAB — SURGICAL PCR SCREEN
MRSA, PCR: NEGATIVE
Staphylococcus aureus: NEGATIVE

## 2020-10-11 LAB — PROTIME-INR
INR: 0.9 (ref 0.8–1.2)
Prothrombin Time: 12 seconds (ref 11.4–15.2)

## 2020-10-11 LAB — GLUCOSE, CAPILLARY: Glucose-Capillary: 197 mg/dL — ABNORMAL HIGH (ref 70–99)

## 2020-10-11 NOTE — Progress Notes (Addendum)
PCP - Einar Pheasant, MD Cardiologist - Bartholome Bill, MD  PPM/ICD - Denies  Chest x-ray - 10/11/20 EKG - 09/14/20 Stress Test - 12/17/17- Care Everywhere ECHO - 01/12/15- Care Everywhere Cardiac Cath - Denies  Sleep Study - Yes, positive for OSA CPAP - Yes  Fasting Blood Sugar ~130 Checks Blood Sugar 2 times a day. Last A1C 7.3 10/02/20  Blood Thinner Instructions: N/A Aspirin Instructions: Per pt, last dose 10/06/20  ERAS Protcol - N/A PRE-SURGERY Ensure or G2- N/A  COVID TEST- 10/11/20 BMP not collected at PAT appt; last done 10/02/20.   Anesthesia review: Yes, cardiac hx.  Patient denies shortness of breath, fever, cough and chest pain at PAT appointment   All instructions explained to the patient, with a verbal understanding of the material. Patient agrees to go over the instructions while at home for a better understanding. Patient also instructed to self quarantine after being tested for COVID-19. The opportunity to ask questions was provided.

## 2020-10-11 NOTE — Pre-Procedure Instructions (Signed)
Surgical Instructions    Your procedure is scheduled on Monday June 6th.  Report to [facility] Main Entrance "A" at 05:30 A.M., then check in with the Admitting office.  Call this number if you have problems the morning of surgery:  [phone]   If you have any questions prior to your surgery date call [phone]: Open Monday-Friday 8am-4pm    Remember:  Do not eat or drink after midnight the night before your surgery     Take these medicines the morning of surgery with A SIP OF WATER   ALPRAZolam (XANAX)- If needed  amLODipine (NORVASC)  carvedilol (COREG)   Oxycodone HCl- If needed  pantoprazole (PROTONIX)  sertraline (ZOLOFT)   tamsulosin (FLOMAX)  predniSONE (DELTASONE)   Azelastine-Fluticasone (DYMISTA NA) nasal spray    As of today, STOP taking any Aspirin (unless otherwise instructed by your surgeon) Aleve, Naproxen, Ibuprofen, Motrin, Advil, Goody's, BC's, all herbal medications, fish oil, and all vitamins.  WHAT DO I DO ABOUT MY DIABETES MEDICATION?    Do not take metFORMIN (GLUCOPHAGE) and Do not take pioglitazone (ACTOS) the morning of surgery.  . The day of surgery, do not take other diabetes injectables, including Trulicity (dulaglutide).   HOW TO MANAGE YOUR DIABETES BEFORE AND AFTER SURGERY  Why is it important to control my blood sugar before and after surgery? . Improving blood sugar levels before and after surgery helps healing and can limit problems. . A way of improving blood sugar control is eating a healthy diet by: o  Eating less sugar and carbohydrates o  Increasing activity/exercise o  Talking with your doctor about reaching your blood sugar goals . High blood sugars (greater than 180 mg/dL) can raise your risk of infections and slow your recovery, so you will need to focus on controlling your diabetes during the weeks before surgery. . Make sure that the doctor who takes care of your diabetes knows about your planned surgery including the  date and location.  How do I manage my blood sugar before surgery? . Check your blood sugar at least 4 times a day, starting 2 days before surgery, to make sure that the level is not too high or low.  . Check your blood sugar the morning of your surgery when you wake up and every 2 hours until you get to the Short Stay unit.  o If your blood sugar is less than 70 mg/dL, you will need to treat for low blood sugar: - Do not take insulin. - Treat a low blood sugar (less than 70 mg/dL) with  cup of clear juice (cranberry or apple), 4 glucose tablets, OR glucose gel. - Recheck blood sugar in 15 minutes after treatment (to make sure it is greater than 70 mg/dL). If your blood sugar is not greater than 70 mg/dL on recheck, call [phone] for further instructions. . Report your blood sugar to the short stay nurse when you get to Short Stay.  . If you are admitted to the hospital after surgery: o Your blood sugar will be checked by the staff and you will probably be given insulin after surgery (instead of oral diabetes medicines) to make sure you have good blood sugar levels. o The goal for blood sugar control after surgery is 80-180 mg/dL.                      Do NOT Smoke (Tobacco/Vaping) or drink Alcohol 24 hours prior to your procedure.  If you use a  CPAP at night, you may bring all equipment for your overnight stay.   Contacts, glasses, piercing's, hearing aid's, dentures or partials may not be worn into surgery, please bring cases for these belongings.    For patients admitted to the hospital, discharge time will be determined by your treatment team.   Patients discharged the day of surgery will not be allowed to drive home, and someone needs to stay with them for 24 hours.    Special instructions:   [place]- Preparing For Surgery  Before surgery, you can play an important role. Because skin is not sterile, your skin needs to be as free of germs as possible. You can reduce the  number of germs on your skin by washing with CHG (chlorahexidine gluconate) Soap before surgery.  CHG is an antiseptic cleaner which kills germs and bonds with the skin to continue killing germs even after washing.    Oral Hygiene is also important to reduce your risk of infection.  Remember - BRUSH YOUR TEETH THE MORNING OF SURGERY WITH YOUR REGULAR TOOTHPASTE  Please do not use if you have an allergy to CHG or antibacterial soaps. If your skin becomes reddened/irritated stop using the CHG.  Do not shave (including legs and underarms) for at least 48 hours prior to first CHG shower. It is OK to shave your face.  Please follow these instructions carefully.   1. Shower the NIGHT BEFORE SURGERY and the MORNING OF SURGERY  2. If you chose to wash your hair, wash your hair first as usual with your normal shampoo.  3. After you shampoo, rinse your hair and body thoroughly to remove the shampoo.  4. Use CHG Soap as you would any other liquid soap. You can apply CHG directly to the skin and wash gently with a scrungie or a clean washcloth.   5. Apply the CHG Soap to your body ONLY FROM THE NECK DOWN.  Do not use on open wounds or open sores. Avoid contact with your eyes, ears, mouth and genitals (private parts). Wash Face and genitals (private parts)  with your normal soap.   6. Wash thoroughly, paying special attention to the area where your surgery will be performed.  7. Thoroughly rinse your body with warm water from the neck down.  8. DO NOT shower/wash with your normal soap after using and rinsing off the CHG Soap.  9. Pat yourself dry with a CLEAN TOWEL.  10. Wear CLEAN PAJAMAS to bed the night before surgery  11. Place CLEAN SHEETS on your bed the night before your surgery  12. DO NOT SLEEP WITH PETS.   Day of Surgery: Shower with CHG soap. Do not wear jewelry. Do not wear lotions, powders, colognes, or deodorant. Do not shave 48 hours prior to surgery.  Men may shave face and  neck. Do not bring valuables to the hospital. [facility] is not responsible for any belongings or valuables. Wear Clean/Comfortable clothing the morning of surgery Remember to brush your teeth WITH YOUR REGULAR TOOTHPASTE.   Please read over the following fact sheets that you were given.

## 2020-10-12 DIAGNOSIS — G510 Bell's palsy: Secondary | ICD-10-CM | POA: Diagnosis not present

## 2020-10-12 DIAGNOSIS — J0100 Acute maxillary sinusitis, unspecified: Secondary | ICD-10-CM | POA: Diagnosis not present

## 2020-10-12 LAB — SARS CORONAVIRUS 2 (TAT 6-24 HRS): SARS Coronavirus 2: NEGATIVE

## 2020-10-12 NOTE — Progress Notes (Signed)
Anesthesia Chart Review:   Case: [id number] Date/Time: 10/15/20 0715   Procedure: Laminectomy - right - L2-L3 - L3-L4 with sublaminar decompression (N/A Back) - 3C   Anesthesia type: General   Pre-op diagnosis: Stenosis   Location: [facility] OR ROOM 21 / [place] OR   Surgeons: Eustace Moore, MD      DISCUSSION: Pt is 75 years old with hx CAD (cardiology notes do not quantify CAD, just document management with ASA and plavix only), PAF, LBBB, stroke, HTN, DM, OSA, benign goiter   VS: BP (!) 137/57   Pulse 73   Temp 36.7 C (Oral)   Resp 18   Ht '5\' 9"'  (1.753 m)   Wt 88.7 kg   SpO2 98%   BMI 28.89 kg/m   PROVIDERS: - PCP is Einar Pheasant, MD - Cardiologist is Bartholome Bill, MD (notes in care everywhere). Last office visit 01/06/20   LABS: Labs reviewed: Acceptable for surgery.  - HbA1c 7.3 on 10/02/20 - BMP 10/02/20: glucose 148, otherwise normal  (all labs ordered are listed, but only abnormal results are displayed)  Labs Reviewed  GLUCOSE, CAPILLARY - Abnormal; Notable for the following components:      Result Value   Glucose-Capillary 197 (*)    All other components within normal limits  CBC WITH DIFFERENTIAL/PLATELET - Abnormal; Notable for the following components:   RDW 15.9 (*)    All other components within normal limits  SARS CORONAVIRUS 2 (TAT 6-24 HRS)  SURGICAL PCR SCREEN  PROTIME-INR     IMAGES: CXR 10/11/20:  - Stable exam.  No active disease. - Aortic Atherosclerosis  MR brain 09/14/20:  1. No acute intracranial abnormality. 2. Findings of chronic ischemic microangiopathy and generalized volume loss.  EKG 09/14/20: Sinus rhythm. Prolonged PR interval. LBBB   CV: Nuclear stress test 12/17/17 (care everywhere): - No significant arrhythmia or ischemia on ETT. LV function 47%. No  reversible ischemia.   Echo 01/12/15 (care everywhere):   Closest EF: >55% (Estimated)   Mitral: TRIVIAL MR  Tricuspid: TRIVIAL TR    Past Medical History:  Diagnosis Date  . C.  difficile colitis   . Coronary artery disease   . Depression   . Diabetes mellitus ([place])   . Diverticulitis   . Diverticulosis   . Dysrhythmia    PAF  . Environmental allergies   . GERD (gastroesophageal reflux disease)   . Goiter    intrathoracic, s/p benign biopsy (Dr Harlow Asa)  . Hypercholesterolemia   . Hypertension   . Osteoarthritis    cervical spine, lumbar spine  . Paroxysmal atrial fibrillation ([facility])   . Pneumonia   . PONV (postoperative nausea and vomiting)   . Sleep apnea    CPAP @ NIGHT  . Stroke ([place])   . TIA (transient ischemic attack)     Past Surgical History:  Procedure Laterality Date  . BACK SURGERY  8/08   s/p fusion of L4-L5  . capsule endoscopy    . CATARACT EXTRACTION  2011   Dr. Herbert Deaner  . [place] SURGERY  2002  . COLONOSCOPY WITH PROPOFOL N/A 07/20/2017   Procedure: COLONOSCOPY WITH PROPOFOL;  Surgeon: Manya Silvas, MD;  Location: [facility] ENDOSCOPY;  Service: Endoscopy;  Laterality: N/A;  . EYE SURGERY     cataract bilateral 10/1999  . eyelid reduction     Dr. Carlis Abbott  . INGUINAL HERNIA REPAIR  1991   Dr, Tamala Julian  . NOSE SURGERY     turbinate reduction  . SEPTOPLASTY  [place]   rotator cuff    MEDICATIONS: . ALPRAZolam (XANAX) 0.25 MG tablet  . amLODipine (NORVASC) 10 MG tablet  . Ascorbic Acid (VITAMIN C) 100 MG tablet  . aspirin EC 81 MG tablet  . atorvastatin (LIPITOR) 10 MG tablet  . Azelastine-Fluticasone (DYMISTA NA)  . blood glucose meter kit and supplies  . carvedilol (COREG) 6.25 MG tablet  . glucose blood (ACCU-CHEK AVIVA PLUS) test strip  . hydrochlorothiazide (HYDRODIURIL) 50 MG tablet  . levocetirizine (XYZAL) 5 MG tablet  . levofloxacin (LEVAQUIN) 750 MG tablet  . lisinopril (ZESTRIL) 40 MG tablet  . metFORMIN (GLUCOPHAGE) 1000 MG tablet  . montelukast (SINGULAIR) 10 MG tablet  . Multiple Vitamin (MULTIVITAMIN WITH MINERALS) TABS tablet  . Oxycodone HCl 10 MG TABS  . pantoprazole (PROTONIX)  40 MG tablet  . pioglitazone (ACTOS) 15 MG tablet  . predniSONE (DELTASONE) 10 MG tablet  . sertraline (ZOLOFT) 100 MG tablet  . tamsulosin (FLOMAX) 0.4 MG CAPS capsule  . testosterone cypionate (DEPOTESTOTERONE CYPIONATE) 200 MG/ML injection  . traZODone (DESYREL) 50 MG tablet  . TRULICITY 6.08 OC/3.5GW SOPN  . valACYclovir (VALTREX) 1000 MG tablet   No current facility-administered medications for this encounter.    If no changes, I anticipate pt can proceed with surgery as scheduled.   Willeen Cass, PhD, FNP-BC [facility] Short Stay Surgical Center/Anesthesiology Phone: [phone] 10/12/2020 9:56 AM

## 2020-10-12 NOTE — Anesthesia Preprocedure Evaluation (Addendum)
Anesthesia Evaluation  Patient identified by MRN, date of birth, ID band Patient awake    Reviewed: Allergy & Precautions, NPO status , Patient's Chart, lab work & pertinent test results  Airway Mallampati: III  TM Distance: >3 FB Neck ROM: Full    Dental no notable dental hx.    Pulmonary sleep apnea and Continuous Positive Airway Pressure Ventilation , former smoker,    Pulmonary exam normal breath sounds clear to auscultation       Cardiovascular hypertension, Pt. on medications and Pt. on home beta blockers + CAD  Normal cardiovascular exam+ dysrhythmias Atrial Fibrillation  Rhythm:Regular Rate:Normal  ECG: rate 79. Sinus rhythm Prolonged PR interval Left bundle branch block   Neuro/Psych PSYCHIATRIC DISORDERS Anxiety Depression Bells Palsy TIACVA, No Residual Symptoms    GI/Hepatic Neg liver ROS, GERD  Medicated and Controlled,  Endo/Other  diabetes, Oral Hypoglycemic Agents  Renal/GU negative Renal ROS     Musculoskeletal  (+) Arthritis ,   Abdominal   Peds  Hematology HLD   Anesthesia Other Findings Lumbar Stenosis  Reproductive/Obstetrics                          Anesthesia Physical Anesthesia Plan  ASA: III  Anesthesia Plan: General   Post-op Pain Management:    Induction: Intravenous  PONV Risk Score and Plan: 3 and Ondansetron, Dexamethasone and Treatment may vary due to age or medical condition  Airway Management Planned: Oral ETT  Additional Equipment:   Intra-op Plan:   Post-operative Plan: Extubation in OR  Informed Consent: I have reviewed the patients History and Physical, chart, labs and discussed the procedure including the risks, benefits and alternatives for the proposed anesthesia with the patient or authorized representative who has indicated his/her understanding and acceptance.     Dental advisory given  Plan Discussed with: CRNA  Anesthesia Plan  Comments: (Reviewed APP note by Durel Salts, FNP )      Anesthesia Quick Evaluation

## 2020-10-14 DIAGNOSIS — I700 Atherosclerosis of aorta: Secondary | ICD-10-CM | POA: Insufficient documentation

## 2020-10-14 NOTE — Assessment & Plan Note (Signed)
Continues on trulicity and metformin. Will need to hold metformin around surgery.  Follow sugars.  Follow metabolic panel and P1W.

## 2020-10-14 NOTE — Addendum Note (Signed)
Addended by: Alisa Graff on: 10/14/2020 08:49 PM   Modules accepted: Orders

## 2020-10-14 NOTE — Assessment & Plan Note (Signed)
Increased stress.  Doing better.  On zoloft.  Increase to 100mg  q day.  Follow.

## 2020-10-14 NOTE — Assessment & Plan Note (Signed)
Continue cpap.  

## 2020-10-14 NOTE — Assessment & Plan Note (Signed)
Continue lipitor.  Low cholesterol diet and exercise.  Follow lipid panel and liver function tests.   

## 2020-10-14 NOTE — Assessment & Plan Note (Signed)
Continues on lipitor.  

## 2020-10-14 NOTE — Assessment & Plan Note (Signed)
Increased back pain.  Planning for back procedure 10/15/20.

## 2020-10-14 NOTE — Assessment & Plan Note (Signed)
No chest pain or sob with increased exercise - riding bike 3 miles with no pain or increased sob.  Continue coreg, statin.  Follow.

## 2020-10-14 NOTE — Assessment & Plan Note (Signed)
Continues on hctz, metoprolol, lisinopril and amlodipine.  Pressure running a little higher lately.  Feel related to increased pain.  Discussed.  Hold on changing medication.  Follow pressures.  Follow metabolic panel.  Will need close intra op and post op monitoring of heart rate and blood pressure to avoid extremes.

## 2020-10-14 NOTE — Assessment & Plan Note (Signed)
No upper symptoms reported.  Continue protonix.  

## 2020-10-14 NOTE — Assessment & Plan Note (Signed)
Follow cbc.  

## 2020-10-14 NOTE — Assessment & Plan Note (Signed)
On zoloft.  Increase dose.  Follow.  Doing better.

## 2020-10-15 ENCOUNTER — Ambulatory Visit (HOSPITAL_COMMUNITY): Payer: Medicare PPO

## 2020-10-15 ENCOUNTER — Other Ambulatory Visit: Payer: Self-pay

## 2020-10-15 ENCOUNTER — Encounter (HOSPITAL_COMMUNITY): Payer: Self-pay | Admitting: Neurological Surgery

## 2020-10-15 ENCOUNTER — Ambulatory Visit (HOSPITAL_COMMUNITY)
Admission: RE | Admit: 2020-10-15 | Discharge: 2020-10-16 | Disposition: A | Discharge status: Home / Self Care | Payer: Medicare PPO | Attending: Neurological Surgery | Admitting: Neurological Surgery

## 2020-10-15 ENCOUNTER — Ambulatory Visit (HOSPITAL_COMMUNITY): Payer: Medicare PPO | Admitting: Emergency Medicine

## 2020-10-15 ENCOUNTER — Ambulatory Visit (HOSPITAL_COMMUNITY): Payer: Medicare PPO | Admitting: Anesthesiology

## 2020-10-15 ENCOUNTER — Encounter (HOSPITAL_COMMUNITY)
Admission: RE | Disposition: A | Discharge status: Home / Self Care | Payer: Self-pay | Source: Home / Self Care | Attending: Neurological Surgery

## 2020-10-15 DIAGNOSIS — Z87891 Personal history of nicotine dependence: Secondary | ICD-10-CM | POA: Insufficient documentation

## 2020-10-15 DIAGNOSIS — I10 Essential (primary) hypertension: Secondary | ICD-10-CM | POA: Diagnosis not present

## 2020-10-15 DIAGNOSIS — I2510 Atherosclerotic heart disease of native coronary artery without angina pectoris: Secondary | ICD-10-CM | POA: Diagnosis not present

## 2020-10-15 DIAGNOSIS — Z79899 Other long term (current) drug therapy: Secondary | ICD-10-CM | POA: Insufficient documentation

## 2020-10-15 DIAGNOSIS — Z7984 Long term (current) use of oral hypoglycemic drugs: Secondary | ICD-10-CM | POA: Insufficient documentation

## 2020-10-15 DIAGNOSIS — M47816 Spondylosis without myelopathy or radiculopathy, lumbar region: Secondary | ICD-10-CM | POA: Diagnosis not present

## 2020-10-15 DIAGNOSIS — I700 Atherosclerosis of aorta: Secondary | ICD-10-CM | POA: Diagnosis not present

## 2020-10-15 DIAGNOSIS — M48061 Spinal stenosis, lumbar region without neurogenic claudication: Secondary | ICD-10-CM | POA: Insufficient documentation

## 2020-10-15 DIAGNOSIS — M4326 Fusion of spine, lumbar region: Secondary | ICD-10-CM | POA: Diagnosis not present

## 2020-10-15 DIAGNOSIS — Z981 Arthrodesis status: Secondary | ICD-10-CM | POA: Diagnosis not present

## 2020-10-15 DIAGNOSIS — E7800 Pure hypercholesterolemia, unspecified: Secondary | ICD-10-CM | POA: Diagnosis not present

## 2020-10-15 DIAGNOSIS — Z7982 Long term (current) use of aspirin: Secondary | ICD-10-CM | POA: Diagnosis not present

## 2020-10-15 DIAGNOSIS — Z8673 Personal history of transient ischemic attack (TIA), and cerebral infarction without residual deficits: Secondary | ICD-10-CM | POA: Insufficient documentation

## 2020-10-15 DIAGNOSIS — Z880 Allergy status to penicillin: Secondary | ICD-10-CM | POA: Insufficient documentation

## 2020-10-15 DIAGNOSIS — Z9889 Other specified postprocedural states: Secondary | ICD-10-CM

## 2020-10-15 DIAGNOSIS — S22080A Wedge compression fracture of T11-T12 vertebra, initial encounter for closed fracture: Secondary | ICD-10-CM | POA: Diagnosis not present

## 2020-10-15 DIAGNOSIS — Z419 Encounter for procedure for purposes other than remedying health state, unspecified: Secondary | ICD-10-CM

## 2020-10-15 HISTORY — PX: LUMBAR LAMINECTOMY/DECOMPRESSION MICRODISCECTOMY: SHX5026

## 2020-10-15 LAB — GLUCOSE, CAPILLARY
Glucose-Capillary: 135 mg/dL — ABNORMAL HIGH (ref 70–99)
Glucose-Capillary: 148 mg/dL — ABNORMAL HIGH (ref 70–99)
Glucose-Capillary: 201 mg/dL — ABNORMAL HIGH (ref 70–99)
Glucose-Capillary: 263 mg/dL — ABNORMAL HIGH (ref 70–99)
Glucose-Capillary: 167 mg/dL — ABNORMAL HIGH (ref 70–99)

## 2020-10-15 SURGERY — LUMBAR LAMINECTOMY/DECOMPRESSION MICRODISCECTOMY 2 LEVELS
Anesthesia: General | Site: Back

## 2020-10-15 MED ORDER — VANCOMYCIN HCL 1000 MG/200ML IV SOLN
1000.0000 mg | Freq: Once | INTRAVENOUS | Status: AC
  Administered 2020-10-15: 1000 mg via INTRAVENOUS
  Filled 2020-10-15: qty 200

## 2020-10-15 MED ORDER — ONDANSETRON HCL 4 MG/2ML IJ SOLN: 4.0000 mg | Freq: Once | INTRAMUSCULAR | Status: DC | PRN

## 2020-10-15 MED ORDER — ROCURONIUM BROMIDE 10 MG/ML (PF) SYRINGE
PREFILLED_SYRINGE | INTRAVENOUS | Status: DC | PRN
  Administered 2020-10-15: 70 mg via INTRAVENOUS
  Administered 2020-10-15: 20 mg via INTRAVENOUS
  Administered 2020-10-15: 30 mg via INTRAVENOUS
  Administered 2020-10-15: 20 mg via INTRAVENOUS

## 2020-10-15 MED ORDER — ACETAMINOPHEN 325 MG PO TABS: 650.0000 mg | ORAL_TABLET | ORAL | Status: DC | PRN

## 2020-10-15 MED ORDER — BUPIVACAINE HCL (PF) 0.25 % IJ SOLN
INTRAMUSCULAR | Status: AC
  Filled 2020-10-15: qty 30

## 2020-10-15 MED ORDER — ALPRAZOLAM 0.25 MG PO TABS
0.2500 mg | ORAL_TABLET | Freq: Two times a day (BID) | ORAL | Status: DC | PRN
  Administered 2020-10-15: 0.25 mg via ORAL
  Filled 2020-10-15: qty 1

## 2020-10-15 MED ORDER — DEXAMETHASONE SODIUM PHOSPHATE 10 MG/ML IJ SOLN
10.0000 mg | Freq: Once | INTRAMUSCULAR | Status: DC
  Filled 2020-10-15: qty 1

## 2020-10-15 MED ORDER — CELECOXIB 200 MG PO CAPS
200.0000 mg | ORAL_CAPSULE | Freq: Two times a day (BID) | ORAL | Status: DC
  Administered 2020-10-15 – 2020-10-16 (×3): 200 mg via ORAL
  Filled 2020-10-15 (×3): qty 1

## 2020-10-15 MED ORDER — OXYCODONE HCL 5 MG PO TABS
10.0000 mg | ORAL_TABLET | ORAL | Status: DC | PRN
  Administered 2020-10-15 – 2020-10-16 (×5): 10 mg via ORAL
  Filled 2020-10-15 (×5): qty 2

## 2020-10-15 MED ORDER — DEXMEDETOMIDINE (PRECEDEX) IN NS 20 MCG/5ML (4 MCG/ML) IV SYRINGE
PREFILLED_SYRINGE | INTRAVENOUS | Status: AC
  Filled 2020-10-15: qty 5

## 2020-10-15 MED ORDER — EPHEDRINE SULFATE-NACL 50-0.9 MG/10ML-% IV SOSY
PREFILLED_SYRINGE | INTRAVENOUS | Status: DC | PRN
  Administered 2020-10-15: 5 mg via INTRAVENOUS
  Administered 2020-10-15: 10 mg via INTRAVENOUS

## 2020-10-15 MED ORDER — MONTELUKAST SODIUM 10 MG PO TABS
10.0000 mg | ORAL_TABLET | Freq: Every day | ORAL | Status: DC
  Administered 2020-10-15: 10 mg via ORAL
  Filled 2020-10-15: qty 1

## 2020-10-15 MED ORDER — DEXAMETHASONE SODIUM PHOSPHATE 10 MG/ML IJ SOLN
INTRAMUSCULAR | Status: AC
  Filled 2020-10-15: qty 1

## 2020-10-15 MED ORDER — AMLODIPINE BESYLATE 5 MG PO TABS: 10.0000 mg | ORAL_TABLET | Freq: Every day | ORAL | Status: DC

## 2020-10-15 MED ORDER — ROCURONIUM BROMIDE 10 MG/ML (PF) SYRINGE
PREFILLED_SYRINGE | INTRAVENOUS | Status: AC
  Filled 2020-10-15: qty 20

## 2020-10-15 MED ORDER — PROPOFOL 10 MG/ML IV BOLUS
INTRAVENOUS | Status: DC | PRN
  Administered 2020-10-15: 180 mg via INTRAVENOUS

## 2020-10-15 MED ORDER — LISINOPRIL 20 MG PO TABS
40.0000 mg | ORAL_TABLET | Freq: Every day | ORAL | Status: DC
  Administered 2020-10-15 – 2020-10-16 (×2): 40 mg via ORAL
  Filled 2020-10-15 (×2): qty 2

## 2020-10-15 MED ORDER — SODIUM CHLORIDE 0.9% FLUSH: 3.0000 mL | INTRAVENOUS | Status: DC | PRN

## 2020-10-15 MED ORDER — LORATADINE 10 MG PO TABS
10.0000 mg | ORAL_TABLET | Freq: Every evening | ORAL | Status: DC
  Administered 2020-10-15: 10 mg via ORAL
  Filled 2020-10-15: qty 1

## 2020-10-15 MED ORDER — GABAPENTIN 300 MG PO CAPS
300.0000 mg | ORAL_CAPSULE | ORAL | Status: AC
  Administered 2020-10-15: 300 mg via ORAL
  Filled 2020-10-15: qty 1

## 2020-10-15 MED ORDER — ACETAMINOPHEN 500 MG PO TABS
1000.0000 mg | ORAL_TABLET | ORAL | Status: AC
  Administered 2020-10-15: 1000 mg via ORAL
  Filled 2020-10-15: qty 2

## 2020-10-15 MED ORDER — METHOCARBAMOL 500 MG PO TABS
500.0000 mg | ORAL_TABLET | Freq: Four times a day (QID) | ORAL | Status: DC | PRN
  Administered 2020-10-15 – 2020-10-16 (×3): 500 mg via ORAL
  Filled 2020-10-15 (×3): qty 1

## 2020-10-15 MED ORDER — SERTRALINE HCL 50 MG PO TABS
100.0000 mg | ORAL_TABLET | Freq: Every day | ORAL | Status: DC
  Administered 2020-10-16: 100 mg via ORAL
  Filled 2020-10-15: qty 2

## 2020-10-15 MED ORDER — TRAZODONE HCL 150 MG PO TABS
75.0000 mg | ORAL_TABLET | Freq: Every day | ORAL | Status: DC
  Administered 2020-10-15: 75 mg via ORAL
  Filled 2020-10-15: qty 1

## 2020-10-15 MED ORDER — VANCOMYCIN HCL IN DEXTROSE 1-5 GM/200ML-% IV SOLN
1000.0000 mg | INTRAVENOUS | Status: AC
  Administered 2020-10-15: 1000 mg via INTRAVENOUS
  Filled 2020-10-15: qty 200

## 2020-10-15 MED ORDER — HEMOSTATIC AGENTS (NO CHARGE) OPTIME
TOPICAL | Status: DC | PRN
  Administered 2020-10-15: 1 via TOPICAL

## 2020-10-15 MED ORDER — LACTATED RINGERS IV SOLN: INTRAVENOUS | Status: DC | PRN

## 2020-10-15 MED ORDER — PHENYLEPHRINE HCL-NACL 10-0.9 MG/250ML-% IV SOLN
INTRAVENOUS | Status: DC | PRN
  Administered 2020-10-15: 20 ug/min via INTRAVENOUS

## 2020-10-15 MED ORDER — ADULT MULTIVITAMIN W/MINERALS CH
1.0000 | ORAL_TABLET | Freq: Every morning | ORAL | Status: DC
  Administered 2020-10-16: 1 via ORAL
  Filled 2020-10-15: qty 1

## 2020-10-15 MED ORDER — MIDAZOLAM HCL 2 MG/2ML IJ SOLN
INTRAMUSCULAR | Status: AC
  Filled 2020-10-15: qty 2

## 2020-10-15 MED ORDER — ORAL CARE MOUTH RINSE: 15.0000 mL | Freq: Once | OROMUCOSAL | Status: AC

## 2020-10-15 MED ORDER — METHOCARBAMOL 1000 MG/10ML IJ SOLN
500.0000 mg | Freq: Four times a day (QID) | INTRAMUSCULAR | Status: DC | PRN
  Filled 2020-10-15 (×2): qty 5

## 2020-10-15 MED ORDER — MENTHOL 3 MG MT LOZG
1.0000 | LOZENGE | OROMUCOSAL | Status: DC | PRN
  Filled 2020-10-15: qty 9

## 2020-10-15 MED ORDER — LIDOCAINE 2% (20 MG/ML) 5 ML SYRINGE
INTRAMUSCULAR | Status: DC | PRN
  Administered 2020-10-15: 80 mg via INTRAVENOUS

## 2020-10-15 MED ORDER — CHLORHEXIDINE GLUCONATE CLOTH 2 % EX PADS: 6.0000 | MEDICATED_PAD | Freq: Once | CUTANEOUS | Status: DC

## 2020-10-15 MED ORDER — THROMBIN 5000 UNITS EX SOLR
CUTANEOUS | Status: DC | PRN
  Administered 2020-10-15: 10000 [IU] via TOPICAL

## 2020-10-15 MED ORDER — METFORMIN HCL 500 MG PO TABS
1000.0000 mg | ORAL_TABLET | Freq: Two times a day (BID) | ORAL | Status: DC
  Administered 2020-10-15 – 2020-10-16 (×2): 1000 mg via ORAL
  Filled 2020-10-15 (×2): qty 2

## 2020-10-15 MED ORDER — BUPIVACAINE HCL (PF) 0.25 % IJ SOLN
INTRAMUSCULAR | Status: DC | PRN
  Administered 2020-10-15: 7 mL

## 2020-10-15 MED ORDER — AMISULPRIDE (ANTIEMETIC) 5 MG/2ML IV SOLN: 10.0000 mg | Freq: Once | INTRAVENOUS | Status: DC | PRN

## 2020-10-15 MED ORDER — MORPHINE SULFATE (PF) 2 MG/ML IV SOLN
2.0000 mg | INTRAVENOUS | Status: DC | PRN
Start: 2020-10-15 — End: 2020-10-16

## 2020-10-15 MED ORDER — POTASSIUM CHLORIDE IN NACL 20-0.9 MEQ/L-% IV SOLN: INTRAVENOUS | Status: DC

## 2020-10-15 MED ORDER — PANTOPRAZOLE SODIUM 40 MG PO TBEC
40.0000 mg | DELAYED_RELEASE_TABLET | Freq: Every morning | ORAL | Status: DC
  Administered 2020-10-16: 40 mg via ORAL
  Filled 2020-10-15: qty 1

## 2020-10-15 MED ORDER — SODIUM CHLORIDE 0.9% FLUSH
3.0000 mL | Freq: Two times a day (BID) | INTRAVENOUS | Status: DC
  Administered 2020-10-15: 3 mL via INTRAVENOUS

## 2020-10-15 MED ORDER — LACTATED RINGERS IV SOLN: INTRAVENOUS | Status: DC

## 2020-10-15 MED ORDER — ASPIRIN EC 81 MG PO TBEC
81.0000 mg | DELAYED_RELEASE_TABLET | Freq: Every evening | ORAL | Status: DC
  Administered 2020-10-15: 81 mg via ORAL
  Filled 2020-10-15: qty 1

## 2020-10-15 MED ORDER — CARVEDILOL 6.25 MG PO TABS
6.2500 mg | ORAL_TABLET | Freq: Two times a day (BID) | ORAL | Status: DC
  Administered 2020-10-15 – 2020-10-16 (×2): 6.25 mg via ORAL
  Filled 2020-10-15 (×2): qty 1

## 2020-10-15 MED ORDER — LEVOCETIRIZINE DIHYDROCHLORIDE 5 MG PO TABS: 5.0000 mg | ORAL_TABLET | Freq: Every evening | ORAL | Status: DC

## 2020-10-15 MED ORDER — SENNA 8.6 MG PO TABS
1.0000 | ORAL_TABLET | Freq: Two times a day (BID) | ORAL | Status: DC
  Administered 2020-10-15 – 2020-10-16 (×2): 8.6 mg via ORAL
  Filled 2020-10-15 (×2): qty 1

## 2020-10-15 MED ORDER — DEXAMETHASONE SODIUM PHOSPHATE 10 MG/ML IJ SOLN
INTRAMUSCULAR | Status: DC | PRN
  Administered 2020-10-15: 10 mg via INTRAVENOUS

## 2020-10-15 MED ORDER — ONDANSETRON HCL 4 MG/2ML IJ SOLN
INTRAMUSCULAR | Status: AC
  Filled 2020-10-15: qty 2

## 2020-10-15 MED ORDER — PHENOL 1.4 % MT LIQD
1.0000 | OROMUCOSAL | Status: DC | PRN
  Administered 2020-10-15: 1 via OROMUCOSAL

## 2020-10-15 MED ORDER — INSULIN ASPART 100 UNIT/ML IJ SOLN
0.0000 [IU] | Freq: Three times a day (TID) | INTRAMUSCULAR | Status: DC
  Administered 2020-10-15: 5 [IU] via SUBCUTANEOUS
  Administered 2020-10-15: 8 [IU] via SUBCUTANEOUS
  Administered 2020-10-16: 5 [IU] via SUBCUTANEOUS

## 2020-10-15 MED ORDER — 0.9 % SODIUM CHLORIDE (POUR BTL) OPTIME
TOPICAL | Status: DC | PRN
  Administered 2020-10-15: 1000 mL

## 2020-10-15 MED ORDER — SODIUM CHLORIDE 0.9 % IV SOLN: 250.0000 mL | INTRAVENOUS | Status: DC

## 2020-10-15 MED ORDER — ACETAMINOPHEN 650 MG RE SUPP: 650.0000 mg | RECTAL | Status: DC | PRN

## 2020-10-15 MED ORDER — PROPOFOL 10 MG/ML IV BOLUS
INTRAVENOUS | Status: AC
  Filled 2020-10-15: qty 40

## 2020-10-15 MED ORDER — PIOGLITAZONE HCL 15 MG PO TABS
15.0000 mg | ORAL_TABLET | Freq: Every morning | ORAL | Status: DC
  Administered 2020-10-16: 15 mg via ORAL
  Filled 2020-10-15: qty 1

## 2020-10-15 MED ORDER — ONDANSETRON HCL 4 MG PO TABS: 4.0000 mg | ORAL_TABLET | Freq: Four times a day (QID) | ORAL | Status: DC | PRN

## 2020-10-15 MED ORDER — PHENYLEPHRINE 40 MCG/ML (10ML) SYRINGE FOR IV PUSH (FOR BLOOD PRESSURE SUPPORT)
PREFILLED_SYRINGE | INTRAVENOUS | Status: AC
  Filled 2020-10-15: qty 10

## 2020-10-15 MED ORDER — THROMBIN 5000 UNITS EX SOLR
OROMUCOSAL | Status: DC | PRN
  Administered 2020-10-15: 5 mL via TOPICAL

## 2020-10-15 MED ORDER — SUGAMMADEX SODIUM 200 MG/2ML IV SOLN
INTRAVENOUS | Status: DC | PRN
  Administered 2020-10-15: 200 mg via INTRAVENOUS

## 2020-10-15 MED ORDER — FENTANYL CITRATE (PF) 250 MCG/5ML IJ SOLN
INTRAMUSCULAR | Status: AC
  Filled 2020-10-15: qty 5

## 2020-10-15 MED ORDER — THROMBIN 5000 UNITS EX SOLR
CUTANEOUS | Status: AC
  Filled 2020-10-15: qty 15000

## 2020-10-15 MED ORDER — ONDANSETRON HCL 4 MG/2ML IJ SOLN: 4.0000 mg | Freq: Four times a day (QID) | INTRAMUSCULAR | Status: DC | PRN

## 2020-10-15 MED ORDER — FENTANYL CITRATE (PF) 250 MCG/5ML IJ SOLN
INTRAMUSCULAR | Status: DC | PRN
  Administered 2020-10-15: 100 ug via INTRAVENOUS
  Administered 2020-10-15: 25 ug via INTRAVENOUS

## 2020-10-15 MED ORDER — FENTANYL CITRATE (PF) 100 MCG/2ML IJ SOLN
INTRAMUSCULAR | Status: AC
  Filled 2020-10-15: qty 2

## 2020-10-15 MED ORDER — FENTANYL CITRATE (PF) 100 MCG/2ML IJ SOLN
25.0000 ug | INTRAMUSCULAR | Status: DC | PRN
  Administered 2020-10-15: 50 ug via INTRAVENOUS

## 2020-10-15 MED ORDER — HYDROCHLOROTHIAZIDE 25 MG PO TABS
50.0000 mg | ORAL_TABLET | Freq: Every morning | ORAL | Status: DC
  Administered 2020-10-15 – 2020-10-16 (×2): 50 mg via ORAL
  Filled 2020-10-15 (×3): qty 2

## 2020-10-15 MED ORDER — CHLORHEXIDINE GLUCONATE 0.12 % MT SOLN
15.0000 mL | Freq: Once | OROMUCOSAL | Status: AC
  Administered 2020-10-15: 15 mL via OROMUCOSAL
  Filled 2020-10-15: qty 15

## 2020-10-15 MED ORDER — TAMSULOSIN HCL 0.4 MG PO CAPS
0.4000 mg | ORAL_CAPSULE | Freq: Every day | ORAL | Status: DC
  Administered 2020-10-16: 0.4 mg via ORAL
  Filled 2020-10-15 (×2): qty 1

## 2020-10-15 SURGICAL SUPPLY — 40 items
BAND RUBBER #18 3X1/16 STRL (MISCELLANEOUS) ×4 IMPLANT
BENZOIN TINCTURE PRP APPL 2/3 (GAUZE/BANDAGES/DRESSINGS) ×2 IMPLANT
BUR CARBIDE MATCH 3.0 (BURR) ×2 IMPLANT
CANISTER SUCT 3000ML PPV (MISCELLANEOUS) ×2 IMPLANT
COVER WAND RF STERILE (DRAPES) IMPLANT
DERMABOND ADVANCED (GAUZE/BANDAGES/DRESSINGS) ×1
DERMABOND ADVANCED .7 DNX12 (GAUZE/BANDAGES/DRESSINGS) ×1 IMPLANT
DRAPE LAPAROTOMY 100X72X124 (DRAPES) ×2 IMPLANT
DRAPE MICROSCOPE LEICA (MISCELLANEOUS) ×2 IMPLANT
DRAPE SURG 17X23 STRL (DRAPES) ×2 IMPLANT
DRSG OPSITE POSTOP 4X6 (GAUZE/BANDAGES/DRESSINGS) ×2 IMPLANT
DURAPREP 26ML APPLICATOR (WOUND CARE) ×2 IMPLANT
ELECT REM PT RETURN 9FT ADLT (ELECTROSURGICAL) ×2
ELECTRODE REM PT RTRN 9FT ADLT (ELECTROSURGICAL) ×1 IMPLANT
GAUZE 4X4 16PLY RFD (DISPOSABLE) IMPLANT
GLOVE BIO SURGEON STRL SZ7 (GLOVE) IMPLANT
GLOVE BIO SURGEON STRL SZ8 (GLOVE) ×2 IMPLANT
GLOVE SURG UNDER POLY LF SZ7 (GLOVE) IMPLANT
GOWN STRL REUS W/ TWL LRG LVL3 (GOWN DISPOSABLE) IMPLANT
GOWN STRL REUS W/ TWL XL LVL3 (GOWN DISPOSABLE) ×1 IMPLANT
GOWN STRL REUS W/TWL 2XL LVL3 (GOWN DISPOSABLE) IMPLANT
GOWN STRL REUS W/TWL LRG LVL3 (GOWN DISPOSABLE)
GOWN STRL REUS W/TWL XL LVL3 (GOWN DISPOSABLE) ×1
HEMOSTAT POWDER KIT SURGIFOAM (HEMOSTASIS) ×2 IMPLANT
KIT BASIN OR (CUSTOM PROCEDURE TRAY) ×2 IMPLANT
KIT TURNOVER KIT B (KITS) ×2 IMPLANT
NEEDLE HYPO 25X1 1.5 SAFETY (NEEDLE) ×2 IMPLANT
NEEDLE SPNL 20GX3.5 QUINCKE YW (NEEDLE) IMPLANT
NS IRRIG 1000ML POUR BTL (IV SOLUTION) ×2 IMPLANT
PACK LAMINECTOMY NEURO (CUSTOM PROCEDURE TRAY) ×2 IMPLANT
PAD ARMBOARD 7.5X6 YLW CONV (MISCELLANEOUS) ×6 IMPLANT
SPONGE SURGIFOAM ABS GEL SZ50 (HEMOSTASIS) ×2 IMPLANT
STRIP CLOSURE SKIN 1/2X4 (GAUZE/BANDAGES/DRESSINGS) ×2 IMPLANT
SUT VIC AB 0 CT1 18XCR BRD8 (SUTURE) ×1 IMPLANT
SUT VIC AB 0 CT1 8-18 (SUTURE) ×1
SUT VIC AB 2-0 CP2 18 (SUTURE) ×2 IMPLANT
SUT VIC AB 3-0 SH 8-18 (SUTURE) ×2 IMPLANT
TOWEL GREEN STERILE (TOWEL DISPOSABLE) ×2 IMPLANT
TOWEL GREEN STERILE FF (TOWEL DISPOSABLE) ×2 IMPLANT
WATER STERILE IRR 1000ML POUR (IV SOLUTION) ×2 IMPLANT

## 2020-10-15 NOTE — Anesthesia Postprocedure Evaluation (Signed)
Anesthesia Post Note  Patient: Ricardo Nash  Procedure(s) Performed: Laminectomy - Lumbar Two-Lumbar Three - Lumbar Three-Lumbar Four with sublaminar decompression (N/A Back)     Patient location during evaluation: PACU Anesthesia Type: General Level of consciousness: awake Pain management: pain level controlled Vital Signs Assessment: post-procedure vital signs reviewed and stable Respiratory status: spontaneous breathing, nonlabored ventilation, respiratory function stable and patient connected to nasal cannula oxygen Cardiovascular status: blood pressure returned to baseline and stable Postop Assessment: no apparent nausea or vomiting Anesthetic complications: no   No complications documented.  Last Vitals:  Vitals:   10/15/20 1618 10/15/20 2024  BP: (!) 121/50 (!) 119/48  Pulse: 75 75  Resp: 18 18  Temp: 36.6 C 36.6 C  SpO2: 95% 96%    Last Pain:  Vitals:   10/15/20 2024  TempSrc: Oral  PainSc:                  [name] [name]

## 2020-10-15 NOTE — Anesthesia Procedure Notes (Signed)
Procedure Name: Intubation Date/Time: 10/15/2020 7:41 AM Performed by: Candis Shine, CRNA Pre-anesthesia Checklist: Patient identified, Emergency Drugs available, Suction available and Patient being monitored Patient Re-evaluated:Patient Re-evaluated prior to induction Oxygen Delivery Method: Circle system utilized Preoxygenation: Pre-oxygenation with 100% oxygen Induction Type: IV induction Ventilation: Two handed mask ventilation required and Oral airway inserted - appropriate to patient size Laryngoscope Size: Mac and 4 Grade View: Grade II Tube type: Oral Tube size: 8.0 mm Number of attempts: 1 Airway Equipment and Method: Stylet and Oral airway Placement Confirmation: ETT inserted through vocal cords under direct vision,  positive ETCO2 and breath sounds checked- equal and bilateral Secured at: 24 cm Tube secured with: Tape Dental Injury: Teeth and Oropharynx as per pre-operative assessment

## 2020-10-15 NOTE — Transfer of Care (Signed)
Immediate Anesthesia Transfer of Care Note  Patient: Ricardo Nash  Procedure(s) Performed: Laminectomy - Lumbar Two-Lumbar Three - Lumbar Three-Lumbar Four with sublaminar decompression (N/A Back)  Patient Location: PACU  Anesthesia Type:General  Level of Consciousness: drowsy and patient cooperative  Airway & Oxygen Therapy: Patient Spontanous Breathing and Patient connected to face mask oxygen  Post-op Assessment: Report given to RN, Post -op Vital signs reviewed and stable and Patient moving all extremities X 4  Post vital signs: Reviewed and stable  Last Vitals:  Vitals Value Taken Time  BP 152/64 10/15/20 1000  Temp    Pulse 79 10/15/20 1002  Resp 21 10/15/20 1002  SpO2 99 % 10/15/20 1002  Vitals shown include unvalidated device data.  Last Pain:  Vitals:   10/15/20 0642  TempSrc:   PainSc: 0-No pain         Complications: No complications documented.

## 2020-10-15 NOTE — Op Note (Signed)
10/15/2020  9:41 AM  PATIENT:  Ricardo Nash  75 y.o. adult  PRE-OPERATIVE DIAGNOSIS: Severe lumbar spinal stenosis L2-3 L3-4 with back and right leg pain  POST-OPERATIVE DIAGNOSIS:  same  PROCEDURE: Decompressive lumbar hemilaminectomy medial facetectomy and foraminotomies L2-3 L3-4 on the right followed by sublaminar decompression  SURGEON:  Sherley Bounds, MD  ASSISTANTS: Dr. Ashok Pall  ANESTHESIA:   General  EBL: 50 ml  Total I/O In: 890 [I.V.:700; IV Piggyback:190] Out: 62 [Blood:50]  BLOOD ADMINISTERED: none  DRAINS: none  SPECIMEN:  none  INDICATION FOR PROCEDURE: This patient presented with back and right leg pain for over a year and a half. Imaging showed adjacent level stenosis L2-3 L3-4. The patient tried conservative measures without relief. Pain was debilitating. Recommended decompressive lumbar laminectomy. Patient understood the risks, benefits, and alternatives and potential outcomes and wished to proceed.  PROCEDURE DETAILS: The patient was taken to the operating room and after induction of adequate generalized endotracheal anesthesia, the patient was rolled into the prone position on the Wilson frame and all pressure points were padded. The lumbar region was cleaned and then prepped with DuraPrep and draped in the usual sterile fashion. 5 cc of local anesthesia was injected and then a dorsal midline incision was made and carried down to the lumbo sacral fascia. The fascia was opened and the paraspinous musculature was taken down in a subperiosteal fashion to expose L2-3 and L3-4 on the right. Intraoperative x-ray confirmed my level, and then I used a combination of the high-speed drill and the Kerrison punches to perform a hemilaminectomy, medial facetectomy, and foraminotomy at L2-3 and L3-4 on the right. The underlying yellow ligament was opened and removed in a piecemeal fashion to expose the underlying dura and exiting nerve root. I undercut the lateral recess  and dissected down until I was medial to and distal to the pedicle. The nerve root was well decompressed.  He had very severe spinal stenosis and it appeared to be longstanding because the yellow ligament was quite adherent to the dura.  We spent considerable time dissecting the yellow ligament away from the dura with a nerve hook.  I undercut the lateral recess as best I could.  I performed a foraminotomy around the L3 and L4 nerve roots.  Dissect until this against the medial pedicle wall and distal to this at both levels.  I then drilled up under the spinous process and under the opposite lamina and then again released the ligament from the dura and performed a sublaminar decompression to decompress the left lateral recess from the right-hand side.. I then palpated with a coronary dilator along the nerve root and into the foramen to assure adequate decompression. I felt no more compression of the nerve roots.  The dura appeared to be full and capacious in the midline and across to the left-hand side. I irrigated with saline solution containing bacitracin. Achieved hemostasis with bipolar cautery, and then closed the fascia with 0 Vicryl. I closed the subcutaneous tissues with 2-0 Vicryl and the subcuticular tissues with 3-0 Vicryl. The skin was then closed with benzoin and Steri-Strips. The drapes were removed, a sterile dressing was applied.  My nurse practitioner was involved in the exposure, safe retraction of the neural elements, the disc work and the closure. the patient was awakened from general anesthesia and transferred to the recovery room in stable condition. At the end of the procedure all sponge, needle and instrument counts were correct.    PLAN OF  CARE: Admit for overnight observation  PATIENT DISPOSITION:  PACU - hemodynamically stable.   Delay start of Pharmacological VTE agent (>24hrs) due to surgical blood loss or risk of bleeding:  yes

## 2020-10-15 NOTE — H&P (Signed)
Subjective: Patient is a 74 y.o. adult admitted for stenosis with radiculopathy. Onset of symptoms was several months ago, gradually worsening since that time.  The pain is rated severe, and is located at the across the lower back and radiates to R quad. The pain is described as aching and occurs all day. The symptoms have been progressive. Symptoms are exacerbated by exercise and standing. MRI or CT showed stenosis   Past Medical History:  Diagnosis Date  . C. difficile colitis   . Coronary artery disease   . Depression   . Diabetes mellitus ([place])   . Diverticulitis   . Diverticulosis   . Dysrhythmia    PAF  . Environmental allergies   . GERD (gastroesophageal reflux disease)   . Goiter    intrathoracic, s/p benign biopsy (Dr Harlow Asa)  . Hypercholesterolemia   . Hypertension   . Osteoarthritis    cervical spine, lumbar spine  . Paroxysmal atrial fibrillation ([facility])   . Pneumonia   . PONV (postoperative nausea and vomiting)   . Sleep apnea    CPAP @ NIGHT  . Stroke ([place])   . TIA (transient ischemic attack)     Past Surgical History:  Procedure Laterality Date  . BACK SURGERY  8/08   s/p fusion of L4-L5  . capsule endoscopy    . CATARACT EXTRACTION  2011   Dr. Herbert Deaner  . [place] SURGERY  2002  . COLONOSCOPY WITH PROPOFOL N/A 07/20/2017   Procedure: COLONOSCOPY WITH PROPOFOL;  Surgeon: Manya Silvas, MD;  Location: [facility] ENDOSCOPY;  Service: Endoscopy;  Laterality: N/A;  . EYE SURGERY     cataract bilateral 10/1999  . eyelid reduction     Dr. Carlis Abbott  . INGUINAL HERNIA REPAIR  1991   Dr, Tamala Julian  . NOSE SURGERY     turbinate reduction  . SEPTOPLASTY  1975  . SHOULDER SURGERY  2000   rotator cuff    Prior to Admission medications   Medication Sig Start Date End Date Taking? Authorizing Provider  ALPRAZolam (XANAX) 0.25 MG tablet Take 1 tablet (0.25 mg total) by mouth 2 (two) times daily as needed. Patient taking differently: Take 0.25 mg by mouth 2 (two) times daily  as needed for anxiety. 09/17/20  Yes Einar Pheasant, MD  amLODipine (NORVASC) 10 MG tablet TAKE 1 TABLET BY MOUTH ONCE DAILY Patient taking differently: Take 10 mg by mouth daily in the afternoon. 09/10/20  Yes Einar Pheasant, MD  Ascorbic Acid (VITAMIN C) 100 MG tablet Take 100 mg by mouth in the morning.   Yes [provider]  aspirin EC 81 MG tablet Take 81 mg by mouth every evening. Swallow whole.   Yes [provider]  atorvastatin (LIPITOR) 10 MG tablet Take 1 tablet (10 mg total) by mouth daily. Patient taking differently: Take 10 mg by mouth at bedtime. 02/28/20  Yes Einar Pheasant, MD  Azelastine-Fluticasone (DYMISTA NA) Place 1 spray into the nose in the morning and at bedtime.   Yes [provider]  carvedilol (COREG) 6.25 MG tablet Take 1 tablet (6.25 mg total) by mouth 2 (two) times daily with a meal. 10/04/20  Yes Einar Pheasant, MD  hydrochlorothiazide (HYDRODIURIL) 50 MG tablet Take 1 tablet (50 mg total) by mouth daily. Patient taking differently: Take 50 mg by mouth in the morning. 02/28/20  Yes Einar Pheasant, MD  levocetirizine (XYZAL) 5 MG tablet Take 1 tablet (5 mg total) by mouth every evening. 06/05/20  Yes Einar Pheasant, MD  levofloxacin (LEVAQUIN) 750 MG tablet Take 750 mg by mouth daily. 09/26/20  Yes [provider]  lisinopril (ZESTRIL) 40 MG tablet TAKE 1 TABLET BY MOUTH ONCE DAILY 10/09/20  Yes Einar Pheasant, MD  metFORMIN (GLUCOPHAGE) 1000 MG tablet TAKE 1 TABLET BY MOUTH TWICE DAILY WITH MEALS Patient taking differently: Take 1,000 mg by mouth 2 (two) times daily with a meal. 05/07/20  Yes Einar Pheasant, MD  montelukast (SINGULAIR) 10 MG tablet Take 1 tablet (10 mg total) by mouth at bedtime. 06/05/20  Yes Einar Pheasant, MD  Multiple Vitamin (MULTIVITAMIN WITH MINERALS) TABS tablet Take 1 tablet by mouth in the morning.   Yes [provider]  Oxycodone HCl 10 MG TABS Take 10 mg by mouth every 6 (six) hours as needed  (pain.).   Yes [provider]  pantoprazole (PROTONIX) 40 MG tablet TAKE 1 TABLET BY MOUTH ONCE DAILY Patient taking differently: Take 40 mg by mouth in the morning. 05/07/20  Yes Einar Pheasant, MD  pioglitazone (ACTOS) 15 MG tablet Take 1 tablet (15 mg total) by mouth daily. Patient taking differently: Take 15 mg by mouth in the morning. 06/05/20  Yes Einar Pheasant, MD  predniSONE (DELTASONE) 10 MG tablet Take 10-60 mg by mouth See admin instructions. Day 1 take 6 tablets Day 2 take 6 tablets Day 3 take 5 tablets Day 4 take 5 tablets Day 5 take 4 tablets Day 6 take 4 tablets Day 7 take 3 tablets Day 8 take 3 tablets Day 9 take 2 tablets Day 10 take 2 tablets Day 11 take 1 tablet Day 12 take 1 tablet 09/26/20  Yes [provider]  sertraline (ZOLOFT) 100 MG tablet Take 1 tablet (100 mg total) by mouth daily. 10/04/20  Yes Einar Pheasant, MD  tamsulosin (FLOMAX) 0.4 MG CAPS capsule Take 0.4 mg by mouth daily with breakfast.  03/24/14  Yes [provider]  testosterone cypionate (DEPOTESTOTERONE CYPIONATE) 200 MG/ML injection Inject 200 mg into the muscle every 14 (fourteen) days. 08/17/14  Yes [provider]  traZODone (DESYREL) 50 MG tablet Take 1.5 tablets (75 mg total) by mouth at bedtime. Patient taking differently: Take 75 mg by mouth at bedtime as needed for sleep. 06/05/20  Yes Einar Pheasant, MD  TRULICITY 6.83 MH/9.6QI SOPN INJECT 0.75MG SUBQ ONCE A WEEK Patient taking differently: Inject 0.75 mg into the skin every Sunday. 05/07/20  Yes Einar Pheasant, MD  valACYclovir (VALTREX) 1000 MG tablet Take 1,000 mg by mouth 3 (three) times daily. 09/26/20  Yes [provider]  blood glucose meter kit and supplies Dispense based on patient and insurance preference. Use up to four times daily as directed. (FOR ICD-10 E10.9, E11.9). 11/11/19   Einar Pheasant, MD  glucose blood (ACCU-CHEK AVIVA PLUS) test strip TEST SUGAR TWICE DAILY 11/11/19   Einar Pheasant,  MD   Allergies  Allergen Reactions  . Penicillins Hives    Social History   Tobacco Use  . Smoking status: Former Research scientist (life sciences)  . Smokeless tobacco: Never Used  Substance Use Topics  . Alcohol use: Yes    Alcohol/week: 0.0 standard drinks    Comment: occasional    Family History  Problem Relation Age of Onset  . Congestive Heart Failure Father   . Heart disease Father        myocardial infarction  . Rheumatic fever Father        valvular disease  . Heart disease Mother        s/p CABG (age 62)  .  Kidney disease Sister   . Colon cancer Neg Hx   . Prostate cancer Neg Hx      Review of Systems  Positive ROS: neg  All other systems have been reviewed and were otherwise negative with the exception of those mentioned in the HPI and as above.  Objective: Vital signs in last 24 hours: Temp:  [98 F (36.7 C)] 98 F (36.7 C) (06/06 0555) Pulse Rate:  [71] 71 (06/06 0555) Resp:  [17] 17 (06/06 0555) BP: (143)/(53) 143/53 (06/06 0555) SpO2:  [97 %] 97 % (06/06 0555) Weight:  [88.7 kg] 88.7 kg (06/06 0555)  General Appearance: Alert, cooperative, no distress, appears stated age Head: Normocephalic, without obvious abnormality, atraumatic Eyes: PERRL, conjunctiva/corneas clear, EOM's intact    Neck: Supple, symmetrical, trachea midline Back: Symmetric, no curvature, ROM normal, no CVA tenderness Lungs:  respirations unlabored Heart: Regular rate and rhythm Abdomen: Soft, non-tender Extremities: Extremities normal, atraumatic, no cyanosis or edema Pulses: 2+ and symmetric all extremities Skin: Skin color, texture, turgor normal, no rashes or lesions  NEUROLOGIC:   Mental status: Alert and oriented x4,  no aphasia, good attention span, fund of knowledge, and memory Motor Exam - grossly normal Sensory Exam - grossly normal Reflexes: trace Coordination - grossly normal Gait - grossly normal Balance - grossly normal Cranial Nerves: I: smell Not tested  II: visual acuity   OS: nl    OD: nl  II: visual fields Full to confrontation  II: pupils Equal, round, reactive to light  III,VII: ptosis None  III,IV,VI: extraocular muscles  Full ROM  V: mastication Normal  V: facial light touch sensation  Normal  V,VII: corneal reflex  Present  VII: facial muscle function - upper  Normal  VII: facial muscle function - lower Normal  VIII: hearing Not tested  IX: soft palate elevation  Normal  IX,X: gag reflex Present  XI: trapezius strength  5/5  XI: sternocleidomastoid strength 5/5  XI: neck flexion strength  5/5  XII: tongue strength  Normal    Data Review Lab Results  Component Value Date   WBC 8.4 10/11/2020   HGB 14.3 10/11/2020   HCT 45.3 10/11/2020   MCV 93.4 10/11/2020   PLT 219 10/11/2020   Lab Results  Component Value Date   NA 139 10/02/2020   K 3.9 10/02/2020   CL 99 10/02/2020   CO2 27 10/02/2020   BUN 19 10/02/2020   CREATININE 0.85 10/02/2020   GLUCOSE 148 (H) 10/02/2020   Lab Results  Component Value Date   INR 0.9 10/11/2020    Assessment/Plan:  Estimated body mass index is 28.88 kg/m as calculated from the following:   Height as of this encounter: _0  (1.753 m).   Weight as of this encounter: 88.7 kg. Patient admitted for lum lam L2-3 L3-4. Patient has failed a reasonable attempt at conservative therapy.  I explained the condition and procedure to the patient and answered any questions.  Patient wishes to proceed with procedure as planned. Understands risks/ benefits and typical outcomes of procedure.   Eustace Moore 10/15/2020 7:25 AM

## 2020-10-15 NOTE — Progress Notes (Signed)
Pharmacy Antibiotic Note  Ricardo Nash is a 75 y.o. adult admitted on 10/15/2020 with surgical prophylaxis.  Pharmacy has been consulted for vancomycin dosing. Noted PCN allergy with hives. Never received PCN or cephalosporins in our system. No drain per RN. Received vanc at 6:30 am.   Plan: Vancomycin 1g x1 at 1800  Height: 5\' 9"  (175.3 cm) Weight: 88.7 kg (195 lb 8.8 oz) IBW/kg (Calculated) : 70.7  Temp (24hrs), Avg:97.5 F (36.4 C), Min:97 F (36.1 C), Max:98 F (36.7 C)  Recent Labs  Lab 10/11/20 1000  WBC 8.4    Estimated Creatinine Clearance (by C-G formula based on SCr of 0.85 mg/dL) Male: 66.8 mL/min Male: 81.5 mL/min    Allergies  Allergen Reactions  . Penicillins Hives    Thank you for allowing pharmacy to be a part of this patient's care.  Benetta Spar, PharmD, BCPS, BCCP Clinical Pharmacist  Please check AMION for all [place] phone numbers After 10:00 PM, call [place] [phone]

## 2020-10-16 ENCOUNTER — Encounter (HOSPITAL_COMMUNITY): Payer: Self-pay | Admitting: Neurological Surgery

## 2020-10-16 DIAGNOSIS — Z7984 Long term (current) use of oral hypoglycemic drugs: Secondary | ICD-10-CM | POA: Diagnosis not present

## 2020-10-16 DIAGNOSIS — I700 Atherosclerosis of aorta: Secondary | ICD-10-CM | POA: Diagnosis not present

## 2020-10-16 DIAGNOSIS — Z981 Arthrodesis status: Secondary | ICD-10-CM | POA: Diagnosis not present

## 2020-10-16 DIAGNOSIS — Z87891 Personal history of nicotine dependence: Secondary | ICD-10-CM | POA: Diagnosis not present

## 2020-10-16 DIAGNOSIS — Z7982 Long term (current) use of aspirin: Secondary | ICD-10-CM | POA: Diagnosis not present

## 2020-10-16 DIAGNOSIS — M48061 Spinal stenosis, lumbar region without neurogenic claudication: Secondary | ICD-10-CM | POA: Diagnosis not present

## 2020-10-16 DIAGNOSIS — Z880 Allergy status to penicillin: Secondary | ICD-10-CM | POA: Diagnosis not present

## 2020-10-16 DIAGNOSIS — Z79899 Other long term (current) drug therapy: Secondary | ICD-10-CM | POA: Diagnosis not present

## 2020-10-16 DIAGNOSIS — Z8673 Personal history of transient ischemic attack (TIA), and cerebral infarction without residual deficits: Secondary | ICD-10-CM | POA: Diagnosis not present

## 2020-10-16 LAB — GLUCOSE, CAPILLARY: Glucose-Capillary: 206 mg/dL — ABNORMAL HIGH (ref 70–99)

## 2020-10-16 MED ORDER — METHOCARBAMOL 500 MG PO TABS: 500.0000 mg | ORAL_TABLET | Freq: Four times a day (QID) | ORAL | 0 refills | Status: DC

## 2020-10-16 MED ORDER — OXYCODONE-ACETAMINOPHEN 5-325 MG PO TABS: 1.0000 | ORAL_TABLET | ORAL | 0 refills | Status: DC | PRN

## 2020-10-16 NOTE — Progress Notes (Signed)
Patient is discharged from room 3C07 at this time. Alert and in table condition. IV sit d/c'd and instructions read to patient and spouse with understanding verbalized and all questions answered. Left unit via wheelchair with all belongings at side.

## 2020-10-16 NOTE — Discharge Instructions (Signed)
Wound Care Leave incision open to air. You may shower. Do not scrub directly on incision.  Do not put any creams, lotions, or ointments on incision. Activity Walk each and every day, increasing distance each day. No lifting greater than 5 lbs.  Avoid bending, arching, and twisting. No driving for 2 weeks; may ride as a passenger locally. Diet Resume your normal diet.  Return to Work Will be discussed at you follow up appointment. Call Your Doctor If Any of These Occur Redness, drainage, or swelling at the wound.  Temperature greater than 101 degrees. Severe pain not relieved by pain medication. Incision starts to come apart. Follow Up Appt Call today for appointment in 2-4 weeks ([phone]) or for problems.  If you have any hardware placed in your spine, you will need an x-ray before your appointment.

## 2020-10-16 NOTE — Evaluation (Signed)
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 S/p L2-4 decompression. PMH including CAD, depression, DM, HTN, OA, CVA, bells palsy, rotator cuff (2000), prior cervical sx, and prior back sx.   Clinical Impression   PTA, pt was living with his wife and was independent; using [place] as needed. Currently, pt requires Supervision for ADLs and functional mobility using SPC. Provided education and handout on back precautions, bed mobility, sleep positioning, grooming, LB ADLs, toileting, and shower transfer with shower seat; pt demonstrated understanding. Answered all pt questions. Recommend dc home once medically stable per physician. All acute OT needs met and will sign off. Thank you.    Follow Up Recommendations  No OT follow up;Follow surgeon's recommendation for DC plan and follow-up therapies (Discussed OP PT once cleared by MD)    Equipment Recommendations  None recommended by OT    Recommendations for Other Services       Precautions / Restrictions Precautions Precautions: Back Precaution Booklet Issued: Yes (comment) Precaution Comments: Reviewed back precautions and compensatory techniques Required Braces or Orthoses: Other Brace Other Brace: No brace needed per MD Restrictions Weight Bearing Restrictions: No      Mobility Bed Mobility Overal bed mobility: Needs Assistance Bed Mobility: Rolling;Sidelying to Sit Rolling: Min assist Sidelying to sit: Min assist       General bed mobility comments: Min A to bring trunk over during rolling and then elevate into sitting    Transfers Overall transfer level: Needs assistance Equipment used: None Transfers: Sit to/from Stand Sit to Stand: Supervision         General transfer comment: Supervision for safety    Balance Overall balance  assessment: Needs assistance Sitting-balance support: No upper extremity supported;Feet supported Sitting balance-Leahy Scale: Good     Standing balance support: No upper extremity supported;During functional activity Standing balance-Leahy Scale: Fair Standing balance comment: Benefits from [facility] for mobility                           ADL either performed or assessed with clinical judgement   ADL Overall ADL's : Needs assistance/impaired                                       General ADL Comments: Providing education and handout on back precautions and compensatory techniques for bed mobility, sleep positioning, grooming, LB ADLs, toileting, and shower transfer. Also practicing stair training.     Vision Baseline Vision/History: Wears glasses Wears Glasses: At all times Patient Visual Report: No change from baseline       Perception     Praxis      Pertinent Vitals/Pain Pain Assessment: Faces Faces Pain Scale: Hurts a little bit Pain Location: Back Pain Descriptors / Indicators: Sore Pain Intervention(s): Monitored during session;Limited activity within patient's tolerance;Repositioned     Hand Dominance Right   Extremity/Trunk Assessment Upper Extremity Assessment Upper Extremity Assessment: Overall WFL for tasks assessed   Lower Extremity Assessment Lower Extremity Assessment: Generalized weakness   Cervical / Trunk Assessment Cervical / Trunk Assessment: Other exceptions Cervical / Trunk Exceptions: s/p back sx   Communication Communication Communication: No difficulties   Cognition Arousal/Alertness: Awake/alert Behavior During Therapy: WFL for tasks assessed/performed Overall Cognitive Status:  Within Functional Limits for tasks assessed                                     General Comments  Wife present throughout    Exercises     Shoulder Instructions      [place] expects to be  discharged to:: Private residence Living Arrangements: Spouse/significant other Available Help at Discharge: Family;Available 24 hours/day Type of Home: House Home Access: Stairs to enter [employer] of Steps: 2 Entrance Stairs-Rails: Can reach both Home Layout: One level     Bathroom Shower/Tub: Occupational psychologist: Handicapped height     Home Equipment: Shower seat - built in;Grab bars - tub/shower;Hand held shower head;Cane - single point          Prior Functioning/Environment Level of Independence: Independent with assistive device(s)        Comments: Will use SPC as needed        OT Problem List: Decreased range of motion;Impaired balance (sitting and/or standing);Decreased knowledge of precautions      OT Treatment/Interventions:      OT Goals(Current goals can be found in the care plan section) Acute Rehab OT Goals Patient Stated Goal: Go home OT Goal Formulation: All assessment and education complete, DC therapy  OT Frequency:     Barriers to D/C:            Co-evaluation              AM-PAC OT "6 Clicks" Daily Activity     Outcome Measure Help from another person eating meals?: None Help from another person taking care of personal grooming?: A Little Help from another person toileting, which includes using toliet, bedpan, or urinal?: A Little Help from another person bathing (including washing, rinsing, drying)?: A Little Help from another person to put on and taking off regular upper body clothing?: None Help from another person to put on and taking off regular lower body clothing?: A Little 6 Click Score: 20   End of Session Equipment Utilized During Treatment: Gait belt;Other (comment) [facility]) Nurse Communication: Mobility status  Activity Tolerance: Patient tolerated treatment well Patient left: in bed;with call bell/phone within reach;with family/visitor present (WOB)  OT Visit Diagnosis: Other abnormalities of  gait and mobility (R26.89);Muscle weakness (generalized) (M62.81)                Time: 1448-1856 OT Time Calculation (min): 26 min Charges:  OT General Charges $OT Visit: 1 Visit OT Evaluation $OT Eval Low Complexity: 1 Low OT Treatments $Self Care/Home Management : 8-22 mins  Charis Capehart MSOT, OTR/L Acute Rehab Pager: [phone] Office: [place] 10/16/2020, 9:06 AM

## 2020-10-16 NOTE — Discharge Summary (Signed)
Physician Discharge Summary  Patient ID: Ricardo Nash MRN: [id number] DOB/AGE: 1945/09/19 75 y.o.  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 The patient tolerated the procedure well and was taken to the recovery room and then to the floor in stable condition. The hospital course was routine. There were no complications. The wound remained clean dry and intact. Pt had appropriate back soreness. No complaints of leg pain or new N/T/W. The patient remained afebrile with stable vital signs, and tolerated a regular diet. The patient continued to increase activities, and pain was well controlled with oral pain medications.   Consults: None  Significant Diagnostic Studies:  Results for orders placed or performed during the hospital encounter of 10/15/20  Glucose, capillary  Result Value Ref Range   Glucose-Capillary 135 (H) 70 - 99 mg/dL  Glucose, capillary  Result Value Ref Range   Glucose-Capillary 148 (H) 70 - 99 mg/dL  Glucose, capillary  Result Value Ref Range   Glucose-Capillary 201 (H) 70 - 99 mg/dL  Glucose, capillary  Result Value Ref Range   Glucose-Capillary 263 (H) 70 - 99 mg/dL  Glucose, capillary  Result Value Ref Range   Glucose-Capillary 167 (H) 70 - 99 mg/dL   Comment 1 Notify RN    Comment 2 Document in Chart   Glucose, capillary  Result Value Ref Range   Glucose-Capillary 206 (H) 70 - 99 mg/dL   Comment 1 Notify RN    Comment 2 Document in Chart     Chest 2 View  Result Date: 10/11/2020 CLINICAL DATA:  Preop evaluation for spine surgery EXAM: CHEST - 2  VIEW COMPARISON:  05/27/2016 FINDINGS: Stable cardiomegaly without CHF or pneumonia. Lungs remain clear. No effusion or pneumothorax. Aorta atherosclerotic. Degenerative changes of the spine with increased kyphosis. Chronic compression fracture at T12. IMPRESSION: Stable exam.  No active disease. Aortic Atherosclerosis (ICD10-I70.0). Electronically Signed   By: Jerilynn Mages.  Shick M.D.   On: 10/11/2020 13:09   DG Lumbar Spine 2-3 Views  Result Date: 10/15/2020 CLINICAL DATA:  L2-3 and L3-4 decompression. EXAM: LUMBAR SPINE - 2-3 VIEW COMPARISON:  09/20/2020. FINDINGS: 2 cross-table lateral are submitted. The first image views of the lumbar spine, taken at 0753 hours, shows an L4-5 posterior lumbar interbody fusion. Multilevel endplate degenerative changes. Remote T12 compression fracture. Subsequent image, taken at 0814 hours, shows surgical instrument tips projecting posterior to the L2-3 and L3-4 facet joints. IMPRESSION: 1. Intraoperative localization at L2-3 and L3-4. 2. L4-5 posterior lumbar interbody fusion. 3. Remote T12 compression fracture. Electronically Signed   By: Lorin Picket M.D.   On: 10/15/2020 11:04    Antibiotics:  Anti-infectives (From admission, onward)   Start     Dose/Rate Route Frequency Ordered Stop   10/15/20 1800  vancomycin (VANCOREADY) IVPB 1000 mg/200 mL        1,000 mg 200 mL/hr over 60 Minutes Intravenous  Once 10/15/20 1148 10/15/20 1735   10/15/20 0600  vancomycin (VANCOCIN) IVPB 1000 mg/200 mL premix        1,000 mg 200 mL/hr over 60 Minutes Intravenous On call to O.R. 10/15/20 1324 10/15/20 1150  Discharge Exam: Blood pressure (!) 128/49, pulse 86, temperature 97.8 F (36.6 C), temperature source Oral, resp. rate 18, height _0  (1.753 m), weight 88.7 kg, SpO2 97 %. Neurologic: Grossly normal Ambulating and voiding well, incision cdi   Discharge Medications:   Allergies as of 10/16/2020      Reactions   Penicillins Hives      Medication List    TAKE  these medications   Accu-Chek Aviva Plus test strip Generic drug: glucose blood TEST SUGAR TWICE DAILY   ALPRAZolam 0.25 MG tablet Commonly known as: XANAX Take 1 tablet (0.25 mg total) by mouth 2 (two) times daily as needed. What changed: reasons to take this   amLODipine 10 MG tablet Commonly known as: NORVASC TAKE 1 TABLET BY MOUTH ONCE DAILY What changed: when to take this   aspirin EC 81 MG tablet Take 81 mg by mouth every evening. Swallow whole.   atorvastatin 10 MG tablet Commonly known as: LIPITOR Take 1 tablet (10 mg total) by mouth daily. What changed: when to take this   blood glucose meter kit and supplies Dispense based on patient and insurance preference. Use up to four times daily as directed. (FOR ICD-10 E10.9, E11.9).   carvedilol 6.25 MG tablet Commonly known as: COREG Take 1 tablet (6.25 mg total) by mouth 2 (two) times daily with a meal.   DYMISTA NA Place 1 spray into the nose in the morning and at bedtime.   hydrochlorothiazide 50 MG tablet Commonly known as: HYDRODIURIL Take 1 tablet (50 mg total) by mouth daily. What changed: when to take this   levocetirizine 5 MG tablet Commonly known as: XYZAL Take 1 tablet (5 mg total) by mouth every evening.   levofloxacin 750 MG tablet Commonly known as: LEVAQUIN Take 750 mg by mouth daily.   lisinopril 40 MG tablet Commonly known as: ZESTRIL TAKE 1 TABLET BY MOUTH ONCE DAILY   metFORMIN 1000 MG tablet Commonly known as: GLUCOPHAGE TAKE 1 TABLET BY MOUTH TWICE DAILY WITH MEALS   methocarbamol 500 MG tablet Commonly known as: Robaxin Take 1 tablet (500 mg total) by mouth 4 (four) times daily.   montelukast 10 MG tablet Commonly known as: SINGULAIR Take 1 tablet (10 mg total) by mouth at bedtime.   multivitamin with minerals Tabs tablet Take 1 tablet by mouth in the morning.   Oxycodone HCl 10 MG Tabs Take 10 mg by mouth every 6 (six) hours as needed (pain.).   oxyCODONE-acetaminophen  5-325 MG tablet Commonly known as: Percocet Take 1 tablet by mouth every 4 (four) hours as needed for severe pain.   pantoprazole 40 MG tablet Commonly known as: PROTONIX TAKE 1 TABLET BY MOUTH ONCE DAILY What changed: when to take this   pioglitazone 15 MG tablet Commonly known as: ACTOS Take 1 tablet (15 mg total) by mouth daily. What changed: when to take this   predniSONE 10 MG tablet Commonly known as: DELTASONE Take 10-60 mg by mouth See admin instructions. Day 1 take 6 tablets Day 2 take 6 tablets Day 3 take 5 tablets Day 4 take 5 tablets Day 5 take 4 tablets Day 6 take 4 tablets Day 7 take 3 tablets Day 8 take 3 tablets Day 9 take 2 tablets Day 10 take 2 tablets Day 11 take 1 tablet Day 12 take 1 tablet   sertraline 100 MG tablet Commonly known as: ZOLOFT Take 1 tablet (100 mg total) by mouth daily.   tamsulosin 0.4 MG Caps capsule  Commonly known as: FLOMAX Take 0.4 mg by mouth daily with breakfast.   testosterone cypionate 200 MG/ML injection Commonly known as: DEPOTESTOSTERONE CYPIONATE Inject 200 mg into the muscle every 14 (fourteen) days.   traZODone 50 MG tablet Commonly known as: DESYREL Take 1.5 tablets (75 mg total) by mouth at bedtime. What changed:   when to take this  reasons to take this   Trulicity 8.88 HV/8.4GY Sopn Generic drug: Dulaglutide INJECT 0.75MG SUBQ ONCE A WEEK What changed: See the new instructions.   valACYclovir 1000 MG tablet Commonly known as: VALTREX Take 1,000 mg by mouth 3 (three) times daily.   vitamin C 100 MG tablet Take 100 mg by mouth in the morning.       Disposition: home   Final Dx: Decompressive lumbar hemilaminectomy medial facetectomy and foraminotomies L2-3 L3-4 on the right followed by sublaminar decompression   Discharge Instructions     Remove dressing in 72 hours   Complete by: As directed    Call MD for:  difficulty breathing, headache or visual disturbances   Complete by: As directed    Call  MD for:  persistant nausea and vomiting   Complete by: As directed    Call MD for:  redness, tenderness, or signs of infection (pain, swelling, redness, odor or green/yellow discharge around incision site)   Complete by: As directed    Call MD for:  severe uncontrolled pain   Complete by: As directed    Call MD for:  temperature >100.4   Complete by: As directed    Diet - low sodium heart healthy   Complete by: As directed    Driving Restrictions   Complete by: As directed    No driving for 2 weeks, no riding in the car for 1 week   Increase activity slowly   Complete by: As directed    Lifting restrictions   Complete by: As directed    No lifting more than 8 lbs         Signed: Ocie Cornfield Lajune Perine 10/16/2020, 7:57 AM

## 2020-10-26 ENCOUNTER — Other Ambulatory Visit: Payer: Self-pay | Admitting: Internal Medicine

## 2020-11-01 DIAGNOSIS — G510 Bell's palsy: Secondary | ICD-10-CM | POA: Diagnosis not present

## 2020-11-01 DIAGNOSIS — J0100 Acute maxillary sinusitis, unspecified: Secondary | ICD-10-CM | POA: Diagnosis not present

## 2020-11-08 ENCOUNTER — Encounter: Payer: Self-pay | Admitting: Internal Medicine

## 2020-11-09 DIAGNOSIS — R2689 Other abnormalities of gait and mobility: Secondary | ICD-10-CM | POA: Diagnosis not present

## 2020-11-09 DIAGNOSIS — M5459 Other low back pain: Secondary | ICD-10-CM | POA: Diagnosis not present

## 2020-11-09 DIAGNOSIS — M6281 Muscle weakness (generalized): Secondary | ICD-10-CM | POA: Diagnosis not present

## 2020-11-09 DIAGNOSIS — R2681 Unsteadiness on feet: Secondary | ICD-10-CM | POA: Diagnosis not present

## 2020-11-09 DIAGNOSIS — R262 Difficulty in walking, not elsewhere classified: Secondary | ICD-10-CM | POA: Diagnosis not present

## 2020-11-14 ENCOUNTER — Ambulatory Visit: Payer: Medicare PPO | Admitting: Internal Medicine

## 2020-11-19 ENCOUNTER — Other Ambulatory Visit: Payer: Self-pay | Admitting: Internal Medicine

## 2020-11-19 DIAGNOSIS — G4733 Obstructive sleep apnea (adult) (pediatric): Secondary | ICD-10-CM | POA: Diagnosis not present

## 2020-11-20 DIAGNOSIS — R262 Difficulty in walking, not elsewhere classified: Secondary | ICD-10-CM | POA: Diagnosis not present

## 2020-11-20 DIAGNOSIS — R2689 Other abnormalities of gait and mobility: Secondary | ICD-10-CM | POA: Diagnosis not present

## 2020-11-20 DIAGNOSIS — M6281 Muscle weakness (generalized): Secondary | ICD-10-CM | POA: Diagnosis not present

## 2020-11-20 DIAGNOSIS — M5459 Other low back pain: Secondary | ICD-10-CM | POA: Diagnosis not present

## 2020-11-20 DIAGNOSIS — R2681 Unsteadiness on feet: Secondary | ICD-10-CM | POA: Diagnosis not present

## 2020-11-22 DIAGNOSIS — M6281 Muscle weakness (generalized): Secondary | ICD-10-CM | POA: Diagnosis not present

## 2020-11-22 DIAGNOSIS — M5459 Other low back pain: Secondary | ICD-10-CM | POA: Diagnosis not present

## 2020-11-22 DIAGNOSIS — R2689 Other abnormalities of gait and mobility: Secondary | ICD-10-CM | POA: Diagnosis not present

## 2020-11-22 DIAGNOSIS — R262 Difficulty in walking, not elsewhere classified: Secondary | ICD-10-CM | POA: Diagnosis not present

## 2020-11-22 DIAGNOSIS — R2681 Unsteadiness on feet: Secondary | ICD-10-CM | POA: Diagnosis not present

## 2020-11-30 DIAGNOSIS — R2681 Unsteadiness on feet: Secondary | ICD-10-CM | POA: Diagnosis not present

## 2020-11-30 DIAGNOSIS — M6281 Muscle weakness (generalized): Secondary | ICD-10-CM | POA: Diagnosis not present

## 2020-11-30 DIAGNOSIS — R2689 Other abnormalities of gait and mobility: Secondary | ICD-10-CM | POA: Diagnosis not present

## 2020-11-30 DIAGNOSIS — M5459 Other low back pain: Secondary | ICD-10-CM | POA: Diagnosis not present

## 2020-11-30 DIAGNOSIS — R262 Difficulty in walking, not elsewhere classified: Secondary | ICD-10-CM | POA: Diagnosis not present

## 2020-12-04 ENCOUNTER — Encounter: Payer: Self-pay | Admitting: Urology

## 2020-12-04 ENCOUNTER — Other Ambulatory Visit: Payer: Self-pay

## 2020-12-04 ENCOUNTER — Ambulatory Visit: Payer: Medicare PPO | Admitting: Urology

## 2020-12-04 VITALS — BP 130/70 | HR 84 | Ht 69.0 in | Wt 195.0 lb

## 2020-12-04 DIAGNOSIS — R32 Unspecified urinary incontinence: Secondary | ICD-10-CM

## 2020-12-04 DIAGNOSIS — R2681 Unsteadiness on feet: Secondary | ICD-10-CM | POA: Diagnosis not present

## 2020-12-04 DIAGNOSIS — R350 Frequency of micturition: Secondary | ICD-10-CM | POA: Diagnosis not present

## 2020-12-04 DIAGNOSIS — M6281 Muscle weakness (generalized): Secondary | ICD-10-CM | POA: Diagnosis not present

## 2020-12-04 DIAGNOSIS — R262 Difficulty in walking, not elsewhere classified: Secondary | ICD-10-CM | POA: Diagnosis not present

## 2020-12-04 DIAGNOSIS — R2689 Other abnormalities of gait and mobility: Secondary | ICD-10-CM | POA: Diagnosis not present

## 2020-12-04 DIAGNOSIS — M5459 Other low back pain: Secondary | ICD-10-CM | POA: Diagnosis not present

## 2020-12-04 NOTE — Progress Notes (Signed)
12/04/2020 9:16 AM   Ricardo Nash 1945-05-22 [id number]  Referring provider: Einar Pheasant, [address] [place],  [place] [address]  Chief Complaint  Patient presents with   Urinary Incontinence    HPI: [facility] Urology: Patient treated for hypogonadism and low testosterone  Patient would like to reestablish care here.  Takes Flomax twice a day.  His wife gives him testosterone shots every 2 weeks for low testosterone.  His flow was reasonable.  He is post void dribbling.  He voids every 1-2 hours gets up once or twice a night.  She has erectile dysfunction and is failed various oral medications and intracarpal injection therapy with Trimix  He is on oral hypoglycemics.  No history of bladder infections kidney stones or bladder surgery.   PMH: Past Medical History:  Diagnosis Date   C. difficile colitis    Coronary artery disease    Depression    Diabetes mellitus ([facility])    Diverticulitis    Diverticulosis    Dysrhythmia    PAF   Environmental allergies    GERD (gastroesophageal reflux disease)    Goiter    intrathoracic, s/p benign biopsy (Dr Harlow Asa)   Hypercholesterolemia    Hypertension    Osteoarthritis    cervical spine, lumbar spine   Paroxysmal atrial fibrillation (HCC)    Pneumonia    PONV (postoperative nausea and vomiting)    Sleep apnea    CPAP @ NIGHT   Stroke [facility])    TIA (transient ischemic attack)     Surgical History: Past Surgical History:  Procedure Laterality Date   BACK SURGERY  8/08   s/p fusion of L4-L5   capsule endoscopy     CATARACT EXTRACTION  2011   Dr. Herbert Deaner   CERVICAL DISC SURGERY  2002   COLONOSCOPY WITH PROPOFOL N/A 07/20/2017   Procedure: COLONOSCOPY WITH PROPOFOL;  Surgeon: Manya Silvas, MD;  Location: [facility] ENDOSCOPY;  Service: Endoscopy;  Laterality: N/A;   EYE SURGERY     cataract bilateral 10/1999   eyelid reduction     Dr. Kerby Less HERNIA REPAIR  1991   Dr, Cassell Clement  LAMINECTOMY/DECOMPRESSION MICRODISCECTOMY N/A 10/15/2020   Procedure: Laminectomy - Lumbar Two-Lumbar Three - Lumbar Three-Lumbar Four with sublaminar decompression;  Surgeon: Eustace Moore, MD;  Location: [facility];  Service: Neurosurgery;  Laterality: N/A;  Laminectomy - Lumbar Two-Lumbar Three - Lumbar Three-Lumbar Four with sublaminar decompression   NOSE SURGERY     turbinate reduction   SEPTOPLASTY  1975   SHOULDER SURGERY  2000   rotator cuff    Home Medications:  Allergies as of 12/04/2020       Reactions   Penicillins Hives        Medication List        Accurate as of December 04, 2020  9:16 AM. If you have any questions, ask your nurse or doctor.          Accu-Chek Guide test strip Generic drug: glucose blood USE AS DIRECTED TO TEST BLOOD GLUCOSE LEVELS TWICE DAILY   ALPRAZolam 0.25 MG tablet Commonly known as: XANAX Take 1 tablet (0.25 mg total) by mouth 2 (two) times daily as needed. What changed: reasons to take this   amLODipine 10 MG tablet Commonly known as: NORVASC TAKE 1 TABLET BY MOUTH ONCE DAILY What changed: when to take this   aspirin EC 81 MG tablet Take 81 mg by mouth every evening. Swallow  whole.   atorvastatin 10 MG tablet Commonly known as: LIPITOR TAKE 1 TABLET BY MOUTH ONCE DAILY   blood glucose meter kit and supplies Dispense based on patient and insurance preference. Use up to four times daily as directed. (FOR ICD-10 E10.9, E11.9).   carvedilol 6.25 MG tablet Commonly known as: COREG Take 1 tablet (6.25 mg total) by mouth 2 (two) times daily with a meal.   DYMISTA NA Place 1 spray into the nose in the morning and at bedtime.   hydrochlorothiazide 50 MG tablet Commonly known as: HYDRODIURIL Take 1 tablet (50 mg total) by mouth daily. What changed: when to take this   levocetirizine 5 MG tablet Commonly known as: XYZAL Take 1 tablet (5 mg total) by mouth every evening.   levofloxacin 750 MG tablet Commonly known as:  LEVAQUIN Take 750 mg by mouth daily.   lisinopril 40 MG tablet Commonly known as: ZESTRIL TAKE 1 TABLET BY MOUTH ONCE DAILY   metFORMIN 1000 MG tablet Commonly known as: GLUCOPHAGE TAKE 1 TABLET BY MOUTH TWICE DAILY WITH MEALS   methocarbamol 500 MG tablet Commonly known as: Robaxin Take 1 tablet (500 mg total) by mouth 4 (four) times daily.   montelukast 10 MG tablet Commonly known as: SINGULAIR Take 1 tablet (10 mg total) by mouth at bedtime.   multivitamin with minerals Tabs tablet Take 1 tablet by mouth in the morning.   Oxycodone HCl 10 MG Tabs Take 10 mg by mouth every 6 (six) hours as needed (pain.).   oxyCODONE-acetaminophen 5-325 MG tablet Commonly known as: Percocet Take 1 tablet by mouth every 4 (four) hours as needed for severe pain.   pantoprazole 40 MG tablet Commonly known as: PROTONIX TAKE 1 TABLET BY MOUTH ONCE DAILY   pioglitazone 15 MG tablet Commonly known as: ACTOS Take 1 tablet (15 mg total) by mouth daily. What changed: when to take this   predniSONE 10 MG tablet Commonly known as: DELTASONE Take 10-60 mg by mouth See admin instructions. Day 1 take 6 tablets Day 2 take 6 tablets Day 3 take 5 tablets Day 4 take 5 tablets Day 5 take 4 tablets Day 6 take 4 tablets Day 7 take 3 tablets Day 8 take 3 tablets Day 9 take 2 tablets Day 10 take 2 tablets Day 11 take 1 tablet Day 12 take 1 tablet   sertraline 100 MG tablet Commonly known as: ZOLOFT Take 1 tablet (100 mg total) by mouth daily.   tamsulosin 0.4 MG Caps capsule Commonly known as: FLOMAX Take 0.4 mg by mouth daily with breakfast.   testosterone cypionate 200 MG/ML injection Commonly known as: DEPOTESTOSTERONE CYPIONATE Inject 200 mg into the muscle every 14 (fourteen) days.   traZODone 50 MG tablet Commonly known as: DESYREL Take 1.5 tablets (75 mg total) by mouth at bedtime. What changed:  when to take this reasons to take this   Trulicity 0.92 ZR/0.0TM Sopn Generic drug:  Dulaglutide INJECT 0.75MG SUBQ ONCE A WEEK What changed: See the new instructions.   valACYclovir 1000 MG tablet Commonly known as: VALTREX Take 1,000 mg by mouth 3 (three) times daily.   vitamin C 100 MG tablet Take 100 mg by mouth in the morning.        Allergies:  Allergies  Allergen Reactions   Penicillins Hives    Family History: Family History  Problem Relation Age of Onset   Congestive Heart Failure Father    Heart disease Father  myocardial infarction   Rheumatic fever Father        valvular disease   Heart disease Mother        s/p CABG (age 95)   Kidney disease Sister    Colon cancer Neg Hx    Prostate cancer Neg Hx     Social History:  reports that he has quit smoking. He has never used smokeless tobacco. He reports current alcohol use. He reports that he does not use drugs.  ROS:                                        Physical Exam: BP 130/70   Pulse 84   Ht _0  (1.753 m)   Wt 88.5 kg   BMI 28.80 kg/m   Constitutional:  Alert and oriented, No acute distress. HEENT: [place] AT, moist mucus membranes.  Trachea midline, no masses. Cardiovascular: No clubbing, cyanosis, or edema. Respiratory: Normal respiratory effort, no increased work of breathing. GI: Abdomen is soft, nontender, nondistended, no abdominal masses GU: 40 to 50 g benign prostate Skin: No rashes, bruises or suspicious lesions. Lymph: No cervical or inguinal adenopathy. Neurologic: Grossly intact, no focal deficits, moving all 4 extremities. Psychiatric: Normal mood and affect.  Laboratory Data: Lab Results  Component Value Date   WBC 8.4 10/11/2020   HGB 14.3 10/11/2020   HCT 45.3 10/11/2020   MCV 93.4 10/11/2020   PLT 219 10/11/2020    Lab Results  Component Value Date   CREATININE 0.85 10/02/2020    Lab Results  Component Value Date   PSA 0.82 06/10/2019   PSA 0.4 08/11/2014    No results found for: TESTOSTERONE  Lab Results   Component Value Date   HGBA1C 7.3 (H) 10/02/2020    Urinalysis    Component Value Date/Time   COLORURINE YELLOW 12/22/2014 [place] 12/22/2014 1445   LABSPEC 1.010 12/22/2014 1445   PHURINE 5.5 12/22/2014 1445   GLUCOSEU NEGATIVE 12/22/2014 1445   HGBUR NEGATIVE 12/22/2014 1445   BILIRUBINUR NEGATIVE 12/22/2014 1445   KETONESUR NEGATIVE 12/22/2014 1445   UROBILINOGEN 0.2 12/22/2014 1445   NITRITE NEGATIVE 12/22/2014 1445   LEUKOCYTESUR NEGATIVE 12/22/2014 1445    Pertinent Imaging: Urine reviewed.  Chart reviewed.  Assessment & Plan: Patient has chronic low testosterone with benign prostatic hyperplasia.  Flow was reasonable.  Mild frequency and nocturia.  He has stable erectile dysfunction.  I will have him see one of the full-time urologist here for ongoing care.  I did not bring up the possibility of surgical management of her rectal dysfunction.  I will leave that potential discussion to Dr Noel Journey said a day 74 he did not think he would consider surgery  1. Urinary incontinence, unspecified type  - Urinalysis, Complete   No follow-ups on file.  Reece Packer, MD  [place] [address] [place],  [address] [phone]

## 2020-12-06 DIAGNOSIS — R2689 Other abnormalities of gait and mobility: Secondary | ICD-10-CM | POA: Diagnosis not present

## 2020-12-06 DIAGNOSIS — M6281 Muscle weakness (generalized): Secondary | ICD-10-CM | POA: Diagnosis not present

## 2020-12-06 DIAGNOSIS — R262 Difficulty in walking, not elsewhere classified: Secondary | ICD-10-CM | POA: Diagnosis not present

## 2020-12-06 DIAGNOSIS — R2681 Unsteadiness on feet: Secondary | ICD-10-CM | POA: Diagnosis not present

## 2020-12-06 DIAGNOSIS — M5459 Other low back pain: Secondary | ICD-10-CM | POA: Diagnosis not present

## 2020-12-06 LAB — URINALYSIS, COMPLETE
Bilirubin, UA: NEGATIVE
Ketones, UA: NEGATIVE
Leukocytes,UA: NEGATIVE
Nitrite, UA: NEGATIVE
Protein,UA: NEGATIVE
RBC, UA: NEGATIVE
Specific Gravity, UA: 1.02 (ref 1.005–1.030)
Urobilinogen, Ur: 0.2 mg/dL (ref 0.2–1.0)
pH, UA: 6.5 (ref 5.0–7.5)

## 2020-12-06 LAB — MICROSCOPIC EXAMINATION
Bacteria, UA: NONE SEEN
RBC, Urine: NONE SEEN /hpf (ref 0–2)

## 2020-12-07 ENCOUNTER — Other Ambulatory Visit: Payer: Self-pay | Admitting: Internal Medicine

## 2020-12-11 DIAGNOSIS — R262 Difficulty in walking, not elsewhere classified: Secondary | ICD-10-CM | POA: Diagnosis not present

## 2020-12-11 DIAGNOSIS — R2689 Other abnormalities of gait and mobility: Secondary | ICD-10-CM | POA: Diagnosis not present

## 2020-12-11 DIAGNOSIS — M5459 Other low back pain: Secondary | ICD-10-CM | POA: Diagnosis not present

## 2020-12-11 DIAGNOSIS — R2681 Unsteadiness on feet: Secondary | ICD-10-CM | POA: Diagnosis not present

## 2020-12-11 DIAGNOSIS — M6281 Muscle weakness (generalized): Secondary | ICD-10-CM | POA: Diagnosis not present

## 2020-12-13 DIAGNOSIS — M5459 Other low back pain: Secondary | ICD-10-CM | POA: Diagnosis not present

## 2020-12-13 DIAGNOSIS — R262 Difficulty in walking, not elsewhere classified: Secondary | ICD-10-CM | POA: Diagnosis not present

## 2020-12-13 DIAGNOSIS — R2681 Unsteadiness on feet: Secondary | ICD-10-CM | POA: Diagnosis not present

## 2020-12-13 DIAGNOSIS — M6281 Muscle weakness (generalized): Secondary | ICD-10-CM | POA: Diagnosis not present

## 2020-12-13 DIAGNOSIS — R2689 Other abnormalities of gait and mobility: Secondary | ICD-10-CM | POA: Diagnosis not present

## 2020-12-18 DIAGNOSIS — M6281 Muscle weakness (generalized): Secondary | ICD-10-CM | POA: Diagnosis not present

## 2020-12-18 DIAGNOSIS — R2689 Other abnormalities of gait and mobility: Secondary | ICD-10-CM | POA: Diagnosis not present

## 2020-12-18 DIAGNOSIS — R262 Difficulty in walking, not elsewhere classified: Secondary | ICD-10-CM | POA: Diagnosis not present

## 2020-12-18 DIAGNOSIS — M5459 Other low back pain: Secondary | ICD-10-CM | POA: Diagnosis not present

## 2020-12-18 DIAGNOSIS — R2681 Unsteadiness on feet: Secondary | ICD-10-CM | POA: Diagnosis not present

## 2020-12-20 DIAGNOSIS — M5459 Other low back pain: Secondary | ICD-10-CM | POA: Diagnosis not present

## 2020-12-20 DIAGNOSIS — R2681 Unsteadiness on feet: Secondary | ICD-10-CM | POA: Diagnosis not present

## 2020-12-20 DIAGNOSIS — R262 Difficulty in walking, not elsewhere classified: Secondary | ICD-10-CM | POA: Diagnosis not present

## 2020-12-20 DIAGNOSIS — M6281 Muscle weakness (generalized): Secondary | ICD-10-CM | POA: Diagnosis not present

## 2020-12-20 DIAGNOSIS — R2689 Other abnormalities of gait and mobility: Secondary | ICD-10-CM | POA: Diagnosis not present

## 2020-12-25 DIAGNOSIS — R262 Difficulty in walking, not elsewhere classified: Secondary | ICD-10-CM | POA: Diagnosis not present

## 2020-12-25 DIAGNOSIS — M6281 Muscle weakness (generalized): Secondary | ICD-10-CM | POA: Diagnosis not present

## 2020-12-25 DIAGNOSIS — R2681 Unsteadiness on feet: Secondary | ICD-10-CM | POA: Diagnosis not present

## 2020-12-25 DIAGNOSIS — M5459 Other low back pain: Secondary | ICD-10-CM | POA: Diagnosis not present

## 2020-12-25 DIAGNOSIS — R2689 Other abnormalities of gait and mobility: Secondary | ICD-10-CM | POA: Diagnosis not present

## 2020-12-26 DIAGNOSIS — G510 Bell's palsy: Secondary | ICD-10-CM | POA: Diagnosis not present

## 2020-12-26 DIAGNOSIS — R1311 Dysphagia, oral phase: Secondary | ICD-10-CM | POA: Diagnosis not present

## 2020-12-26 DIAGNOSIS — R471 Dysarthria and anarthria: Secondary | ICD-10-CM | POA: Diagnosis not present

## 2020-12-28 DIAGNOSIS — R0989 Other specified symptoms and signs involving the circulatory and respiratory systems: Secondary | ICD-10-CM | POA: Diagnosis not present

## 2020-12-28 DIAGNOSIS — I10 Essential (primary) hypertension: Secondary | ICD-10-CM | POA: Diagnosis not present

## 2020-12-28 DIAGNOSIS — I480 Paroxysmal atrial fibrillation: Secondary | ICD-10-CM | POA: Diagnosis not present

## 2020-12-28 DIAGNOSIS — G4733 Obstructive sleep apnea (adult) (pediatric): Secondary | ICD-10-CM | POA: Diagnosis not present

## 2020-12-28 DIAGNOSIS — R55 Syncope and collapse: Secondary | ICD-10-CM | POA: Diagnosis not present

## 2020-12-31 ENCOUNTER — Ambulatory Visit: Payer: Medicare PPO | Admitting: Internal Medicine

## 2020-12-31 DIAGNOSIS — E291 Testicular hypofunction: Secondary | ICD-10-CM | POA: Diagnosis not present

## 2020-12-31 DIAGNOSIS — I4891 Unspecified atrial fibrillation: Secondary | ICD-10-CM | POA: Diagnosis not present

## 2020-12-31 DIAGNOSIS — M1990 Unspecified osteoarthritis, unspecified site: Secondary | ICD-10-CM | POA: Diagnosis not present

## 2020-12-31 DIAGNOSIS — D6869 Other thrombophilia: Secondary | ICD-10-CM | POA: Diagnosis not present

## 2020-12-31 DIAGNOSIS — Z7409 Other reduced mobility: Secondary | ICD-10-CM | POA: Diagnosis not present

## 2020-12-31 DIAGNOSIS — I2510 Atherosclerotic heart disease of native coronary artery without angina pectoris: Secondary | ICD-10-CM | POA: Diagnosis not present

## 2020-12-31 DIAGNOSIS — E1142 Type 2 diabetes mellitus with diabetic polyneuropathy: Secondary | ICD-10-CM | POA: Diagnosis not present

## 2020-12-31 DIAGNOSIS — N529 Male erectile dysfunction, unspecified: Secondary | ICD-10-CM | POA: Diagnosis not present

## 2020-12-31 DIAGNOSIS — J309 Allergic rhinitis, unspecified: Secondary | ICD-10-CM | POA: Diagnosis not present

## 2020-12-31 DIAGNOSIS — F329 Major depressive disorder, single episode, unspecified: Secondary | ICD-10-CM | POA: Diagnosis not present

## 2020-12-31 DIAGNOSIS — N400 Enlarged prostate without lower urinary tract symptoms: Secondary | ICD-10-CM | POA: Diagnosis not present

## 2020-12-31 DIAGNOSIS — E785 Hyperlipidemia, unspecified: Secondary | ICD-10-CM | POA: Diagnosis not present

## 2020-12-31 DIAGNOSIS — G4700 Insomnia, unspecified: Secondary | ICD-10-CM | POA: Diagnosis not present

## 2020-12-31 DIAGNOSIS — E663 Overweight: Secondary | ICD-10-CM | POA: Diagnosis not present

## 2020-12-31 DIAGNOSIS — F419 Anxiety disorder, unspecified: Secondary | ICD-10-CM | POA: Diagnosis not present

## 2020-12-31 DIAGNOSIS — R32 Unspecified urinary incontinence: Secondary | ICD-10-CM | POA: Diagnosis not present

## 2020-12-31 DIAGNOSIS — I951 Orthostatic hypotension: Secondary | ICD-10-CM | POA: Diagnosis not present

## 2020-12-31 DIAGNOSIS — K219 Gastro-esophageal reflux disease without esophagitis: Secondary | ICD-10-CM | POA: Diagnosis not present

## 2021-01-02 DIAGNOSIS — R2681 Unsteadiness on feet: Secondary | ICD-10-CM | POA: Diagnosis not present

## 2021-01-02 DIAGNOSIS — M5459 Other low back pain: Secondary | ICD-10-CM | POA: Diagnosis not present

## 2021-01-02 DIAGNOSIS — R2689 Other abnormalities of gait and mobility: Secondary | ICD-10-CM | POA: Diagnosis not present

## 2021-01-02 DIAGNOSIS — M6281 Muscle weakness (generalized): Secondary | ICD-10-CM | POA: Diagnosis not present

## 2021-01-02 DIAGNOSIS — R262 Difficulty in walking, not elsewhere classified: Secondary | ICD-10-CM | POA: Diagnosis not present

## 2021-01-10 DIAGNOSIS — R2689 Other abnormalities of gait and mobility: Secondary | ICD-10-CM | POA: Diagnosis not present

## 2021-01-10 DIAGNOSIS — R2681 Unsteadiness on feet: Secondary | ICD-10-CM | POA: Diagnosis not present

## 2021-01-10 DIAGNOSIS — R262 Difficulty in walking, not elsewhere classified: Secondary | ICD-10-CM | POA: Diagnosis not present

## 2021-01-10 DIAGNOSIS — M5459 Other low back pain: Secondary | ICD-10-CM | POA: Diagnosis not present

## 2021-01-10 DIAGNOSIS — M6281 Muscle weakness (generalized): Secondary | ICD-10-CM | POA: Diagnosis not present

## 2021-01-11 ENCOUNTER — Ambulatory Visit: Payer: Medicare PPO | Admitting: Internal Medicine

## 2021-01-15 DIAGNOSIS — R2681 Unsteadiness on feet: Secondary | ICD-10-CM | POA: Diagnosis not present

## 2021-01-15 DIAGNOSIS — M5459 Other low back pain: Secondary | ICD-10-CM | POA: Diagnosis not present

## 2021-01-15 DIAGNOSIS — R262 Difficulty in walking, not elsewhere classified: Secondary | ICD-10-CM | POA: Diagnosis not present

## 2021-01-15 DIAGNOSIS — R2689 Other abnormalities of gait and mobility: Secondary | ICD-10-CM | POA: Diagnosis not present

## 2021-01-15 DIAGNOSIS — M6281 Muscle weakness (generalized): Secondary | ICD-10-CM | POA: Diagnosis not present

## 2021-01-16 ENCOUNTER — Ambulatory Visit: Payer: Medicare PPO | Admitting: Urology

## 2021-01-17 DIAGNOSIS — R262 Difficulty in walking, not elsewhere classified: Secondary | ICD-10-CM | POA: Diagnosis not present

## 2021-01-17 DIAGNOSIS — R2689 Other abnormalities of gait and mobility: Secondary | ICD-10-CM | POA: Diagnosis not present

## 2021-01-17 DIAGNOSIS — M5459 Other low back pain: Secondary | ICD-10-CM | POA: Diagnosis not present

## 2021-01-17 DIAGNOSIS — M6281 Muscle weakness (generalized): Secondary | ICD-10-CM | POA: Diagnosis not present

## 2021-01-17 DIAGNOSIS — R2681 Unsteadiness on feet: Secondary | ICD-10-CM | POA: Diagnosis not present

## 2021-01-22 DIAGNOSIS — M6281 Muscle weakness (generalized): Secondary | ICD-10-CM | POA: Diagnosis not present

## 2021-01-22 DIAGNOSIS — R2681 Unsteadiness on feet: Secondary | ICD-10-CM | POA: Diagnosis not present

## 2021-01-22 DIAGNOSIS — R262 Difficulty in walking, not elsewhere classified: Secondary | ICD-10-CM | POA: Diagnosis not present

## 2021-01-22 DIAGNOSIS — M5459 Other low back pain: Secondary | ICD-10-CM | POA: Diagnosis not present

## 2021-01-22 DIAGNOSIS — R2689 Other abnormalities of gait and mobility: Secondary | ICD-10-CM | POA: Diagnosis not present

## 2021-01-24 DIAGNOSIS — R2689 Other abnormalities of gait and mobility: Secondary | ICD-10-CM | POA: Diagnosis not present

## 2021-01-24 DIAGNOSIS — R2681 Unsteadiness on feet: Secondary | ICD-10-CM | POA: Diagnosis not present

## 2021-01-24 DIAGNOSIS — M6281 Muscle weakness (generalized): Secondary | ICD-10-CM | POA: Diagnosis not present

## 2021-01-24 DIAGNOSIS — R262 Difficulty in walking, not elsewhere classified: Secondary | ICD-10-CM | POA: Diagnosis not present

## 2021-01-24 DIAGNOSIS — M5459 Other low back pain: Secondary | ICD-10-CM | POA: Diagnosis not present

## 2021-02-04 DIAGNOSIS — G510 Bell's palsy: Secondary | ICD-10-CM | POA: Diagnosis not present

## 2021-02-04 DIAGNOSIS — J329 Chronic sinusitis, unspecified: Secondary | ICD-10-CM | POA: Diagnosis not present

## 2021-02-06 DIAGNOSIS — G510 Bell's palsy: Secondary | ICD-10-CM | POA: Diagnosis not present

## 2021-02-07 ENCOUNTER — Other Ambulatory Visit: Payer: Self-pay

## 2021-02-07 ENCOUNTER — Ambulatory Visit: Payer: Medicare PPO | Admitting: Internal Medicine

## 2021-02-07 VITALS — BP 128/70 | HR 69 | Temp 98.0°F | Resp 16 | Ht 69.0 in | Wt 204.6 lb

## 2021-02-07 DIAGNOSIS — F439 Reaction to severe stress, unspecified: Secondary | ICD-10-CM | POA: Diagnosis not present

## 2021-02-07 DIAGNOSIS — E7800 Pure hypercholesterolemia, unspecified: Secondary | ICD-10-CM

## 2021-02-07 DIAGNOSIS — E1159 Type 2 diabetes mellitus with other circulatory complications: Secondary | ICD-10-CM

## 2021-02-07 DIAGNOSIS — I10 Essential (primary) hypertension: Secondary | ICD-10-CM | POA: Diagnosis not present

## 2021-02-07 DIAGNOSIS — I2510 Atherosclerotic heart disease of native coronary artery without angina pectoris: Secondary | ICD-10-CM

## 2021-02-07 DIAGNOSIS — M545 Low back pain, unspecified: Secondary | ICD-10-CM

## 2021-02-07 DIAGNOSIS — G4733 Obstructive sleep apnea (adult) (pediatric): Secondary | ICD-10-CM | POA: Diagnosis not present

## 2021-02-07 DIAGNOSIS — I700 Atherosclerosis of aorta: Secondary | ICD-10-CM

## 2021-02-07 DIAGNOSIS — K219 Gastro-esophageal reflux disease without esophagitis: Secondary | ICD-10-CM | POA: Diagnosis not present

## 2021-02-07 DIAGNOSIS — D509 Iron deficiency anemia, unspecified: Secondary | ICD-10-CM

## 2021-02-07 DIAGNOSIS — Z9889 Other specified postprocedural states: Secondary | ICD-10-CM

## 2021-02-07 DIAGNOSIS — Z23 Encounter for immunization: Secondary | ICD-10-CM

## 2021-02-07 LAB — BASIC METABOLIC PANEL
BUN: 12 mg/dL (ref 6–23)
CO2: 27 mEq/L (ref 19–32)
Calcium: 9.6 mg/dL (ref 8.4–10.5)
Chloride: 102 mEq/L (ref 96–112)
Creatinine, Ser: 0.9 mg/dL (ref 0.40–1.50)
GFR: 82.86 mL/min (ref >60.00)
Glucose, Bld: 131 mg/dL — ABNORMAL HIGH (ref 70–99)
Potassium: 4.2 mEq/L (ref 3.5–5.1)
Sodium: 141 mEq/L (ref 135–145)

## 2021-02-07 LAB — HEPATIC FUNCTION PANEL
ALT: 18 U/L (ref 0–53)
AST: 19 U/L (ref 0–37)
Albumin: 4.2 g/dL (ref 3.5–5.2)
Alkaline Phosphatase: 45 U/L (ref 39–117)
Bilirubin, Direct: 0.2 mg/dL (ref 0.0–0.3)
Total Bilirubin: 0.7 mg/dL (ref 0.2–1.2)
Total Protein: 6.6 g/dL (ref 6.0–8.3)

## 2021-02-07 LAB — LIPID PANEL
Cholesterol: 137 mg/dL (ref 0–200)
HDL: 37.7 mg/dL — ABNORMAL LOW (ref >39.00)
NonHDL: 99.51
Total CHOL/HDL Ratio: 4
Triglycerides: 232 mg/dL — ABNORMAL HIGH (ref 0.0–149.0)
VLDL: 46.4 mg/dL — ABNORMAL HIGH (ref 0.0–40.0)

## 2021-02-07 LAB — TSH: TSH: 0.81 u[IU]/mL (ref 0.35–5.50)

## 2021-02-07 LAB — HEMOGLOBIN A1C: Hgb A1c MFr Bld: 7 % — ABNORMAL HIGH (ref 4.6–6.5)

## 2021-02-07 LAB — LDL CHOLESTEROL, DIRECT: Direct LDL: 79 mg/dL

## 2021-02-07 MED ORDER — MUPIROCIN CALCIUM 2 % EX CREA: 1.0000 "application " | TOPICAL_CREAM | Freq: Two times a day (BID) | CUTANEOUS | 0 refills | Status: DC

## 2021-02-07 NOTE — Progress Notes (Signed)
Patient ID: Ricardo Nash, male   DOB: [birth year], 75 y.o.   MRN: [id number]   Subjective:    Patient ID: Ricardo Nash, male    DOB: 11/29/1945, 75 y.o.   MRN: [id number]  This visit occurred during the SARS-CoV-2 public health emergency.  Safety protocols were in place, including screening questions prior to the visit, additional usage of staff PPE, and extensive cleaning of exam room while observing appropriate contact time as indicated for disinfecting solutions.   Patient here for a scheduled follow up.   Chief Complaint  Patient presents with   Hypertension   .   HPI Doing relatively well.  Recently saw cardiology.  Coreg increased to 12.75m bid.  No chest pain.  Breathing stable.  No increased cough or congestion.  Going to the gym.  Exercising.  Riding bike.  AM sugars 130-180 and PM sugars 120-130s. No abdominal pain.  Bowels moving.  Being followed by Dr MSabra Heck- [facility] Palsy.  Improved. Recommended - speech therapy and massage therapy.  S/p laminectomy - right - L2-L3 - L3-L4.  Leg pain is better.  Persistent back pain.  Request referral to pain management.     Past Medical History:  Diagnosis Date   C. difficile colitis    Coronary artery disease    Depression    Diabetes mellitus ([facility]    Diverticulitis    Diverticulosis    Dysrhythmia    PAF   Environmental allergies    GERD (gastroesophageal reflux disease)    Goiter    intrathoracic, s/p benign biopsy (Dr GHarlow Asa   Hypercholesterolemia    Hypertension    Osteoarthritis    cervical spine, lumbar spine   Paroxysmal atrial fibrillation (HCC)    Pneumonia    PONV (postoperative nausea and vomiting)    Sleep apnea    CPAP @ NIGHT   Stroke ([facility]    TIA (transient ischemic attack)    Past Surgical History:  Procedure Laterality Date   BACK SURGERY  8/08   s/p fusion of L4-L5   capsule endoscopy     CATARACT EXTRACTION  2011   Dr. HHerbert Deaner  CERVICAL DISC SURGERY  2002   COLONOSCOPY WITH PROPOFOL N/A  07/20/2017   Procedure: COLONOSCOPY WITH PROPOFOL;  Surgeon: EManya Silvas MD;  [facility]  Service: Endoscopy;  Laterality: N/A;   EYE SURGERY     cataract bilateral 10/1999   eyelid reduction     Dr. CKerby LessHERNIA REPAIR  1991   Dr, SCassell ClementLAMINECTOMY/DECOMPRESSION MICRODISCECTOMY N/A 10/15/2020   Procedure: Laminectomy - Lumbar Two-Lumbar Three - Lumbar Three-Lumbar Four with sublaminar decompression;  Surgeon: JEustace Moore MD;  [facility] [facility]  Service: Neurosurgery;  Laterality: N/A;  Laminectomy - Lumbar Two-Lumbar Three - Lumbar Three-Lumbar Four with sublaminar decompression   NOSE SURGERY     turbinate reduction   SEPTOPLASTY  1975   SHOULDER SURGERY  2000   rotator cuff   Family History  Problem Relation Age of Onset   Congestive Heart Failure Father    Heart disease Father        myocardial infarction   Rheumatic fever Father        valvular disease   Heart disease Mother        s/p CABG (age 76   Kidney disease Sister    Colon cancer Neg Hx    Prostate cancer Neg Hx    Social History  Socioeconomic History   Marital status: Married    Spouse name: Not on file   Number of children: 2   Years of education: Not on file   Highest education level: Not on file  Occupational History   Occupation: retired Pharmacist, hospital  Tobacco Use   Smoking status: Former   Smokeless tobacco: Never  Scientific laboratory technician Use: Never used  Substance and Sexual Activity   Alcohol use: Yes    Alcohol/week: 0.0 standard drinks    Comment: occasional   Drug use: No   Sexual activity: Not Currently  Other Topics Concern   Not on file  Social History Narrative   Not on file   Social Determinants of Health   Financial Resource Strain: Low Risk    Difficulty of Paying Living Expenses: Not hard at all  Food Insecurity: No Food Insecurity   Worried About Charity fundraiser in the Last Year: Never true   [place] in the Last Year: Never true   Transportation Needs: No Transportation Needs   Lack of Transportation (Medical): No   Lack of Transportation (Non-Medical): No  Physical Activity: Sufficiently Active   Days of Exercise per Week: 5 days   Minutes of Exercise per Session: 60 min  Stress: No Stress Concern Present   Feeling of Stress : Not at all  Social Connections: Unknown   Frequency of Communication with Friends and Family: Not on file   Frequency of Social Gatherings with Friends and Family: Not on file   Attends Religious Services: Not on file   Active Member of Clubs or Organizations: Not on file   Attends Archivist Meetings: Not on file   Marital Status: Married     Review of Systems  Constitutional:  Negative for appetite change and unexpected weight change.  HENT:  Negative for congestion and sinus pressure.   Respiratory:  Negative for cough, chest tightness and shortness of breath.   Cardiovascular:  Negative for chest pain and palpitations.  Gastrointestinal:  Negative for abdominal pain, diarrhea, nausea and vomiting.  Genitourinary:  Negative for difficulty urinating and dysuria.  Musculoskeletal:  Negative for joint swelling and myalgias.  Skin:  Negative for color change and rash.  Neurological:  Negative for dizziness, light-headedness and headaches.  Psychiatric/Behavioral:  Negative for agitation and dysphoric mood.       Objective:     BP 128/70   Pulse 69   Temp 98 F (36.7 C)   Resp 16   Ht '5\' 9"'  (1.753 m)   Wt 204 lb 9.6 oz (92.8 kg)   SpO2 98%   BMI 30.21 kg/m  Wt Readings from Last 3 Encounters:  02/07/21 204 lb 9.6 oz (92.8 kg)  12/04/20 195 lb (88.5 kg)  10/15/20 195 lb 8.8 oz (88.7 kg)    Physical Exam Vitals reviewed.  Constitutional:      General: He is not in acute distress.    Appearance: Normal appearance. He is well-developed.  HENT:     Head: Normocephalic and atraumatic.     Right Ear: External ear normal.     Left Ear: External ear normal.   Eyes:     General: No scleral icterus.       Right eye: No discharge.        Left eye: No discharge.     Conjunctiva/sclera: Conjunctivae normal.  Cardiovascular:     Rate and Rhythm: Normal rate and regular rhythm.  Pulmonary:  Effort: Pulmonary effort is normal. No respiratory distress.     Breath sounds: Normal breath sounds.  Abdominal:     General: Bowel sounds are normal.     Palpations: Abdomen is soft.     Tenderness: There is no abdominal tenderness.  Musculoskeletal:        General: No swelling or tenderness.     Cervical back: Neck supple. No tenderness.  Lymphadenopathy:     Cervical: No cervical adenopathy.  Skin:    Findings: No erythema or rash.  Neurological:     Mental Status: He is alert.  Psychiatric:        Mood and Affect: Mood normal.        Behavior: Behavior normal.     Outpatient Encounter Medications as of 02/07/2021  Medication Sig   carvedilol (COREG) 12.5 MG tablet Take 12.5 mg by mouth daily.   mupirocin cream (BACTROBAN) 2 % Apply 1 application topically 2 (two) times daily.   ACCU-CHEK GUIDE test strip USE AS DIRECTED TO TEST BLOOD GLUCOSE LEVELS TWICE DAILY   amLODipine (NORVASC) 10 MG tablet TAKE 1 TABLET BY MOUTH ONCE DAILY (Patient taking differently: Take 10 mg by mouth daily in the afternoon.)   Ascorbic Acid (VITAMIN C) 100 MG tablet Take 100 mg by mouth in the morning.   aspirin EC 81 MG tablet Take 81 mg by mouth every evening. Swallow whole.   Azelastine-Fluticasone (DYMISTA NA) Place 1 spray into the nose in the morning and at bedtime.   blood glucose meter kit and supplies Dispense based on patient and insurance preference. Use up to four times daily as directed. (FOR ICD-10 E10.9, E11.9).   hydrochlorothiazide (HYDRODIURIL) 50 MG tablet TAKE 1 TABLET BY MOUTH ONCE DAILY   lisinopril (ZESTRIL) 40 MG tablet TAKE 1 TABLET BY MOUTH ONCE DAILY   metFORMIN (GLUCOPHAGE) 1000 MG tablet TAKE 1 TABLET BY MOUTH TWICE DAILY WITH MEALS  (Patient taking differently: Take 1,000 mg by mouth 2 (two) times daily with a meal.)   methocarbamol (ROBAXIN) 500 MG tablet Take 1 tablet (500 mg total) by mouth 4 (four) times daily.   Multiple Vitamin (MULTIVITAMIN WITH MINERALS) TABS tablet Take 1 tablet by mouth in the morning.   oxyCODONE-acetaminophen (PERCOCET) 5-325 MG tablet Take 1 tablet by mouth every 4 (four) hours as needed for severe pain.   pantoprazole (PROTONIX) 40 MG tablet TAKE 1 TABLET BY MOUTH ONCE DAILY   tamsulosin (FLOMAX) 0.4 MG CAPS capsule Take 0.4 mg by mouth daily with breakfast.    testosterone cypionate (DEPOTESTOTERONE CYPIONATE) 200 MG/ML injection Inject 200 mg into the muscle every 14 (fourteen) days.   TRULICITY 8.24 MP/5.3IR SOPN INJECT 0.75MG SUBQ ONCE A WEEK (Patient taking differently: Inject 0.75 mg into the skin every Sunday.)   [DISCONTINUED] ALPRAZolam (XANAX) 0.25 MG tablet Take 1 tablet (0.25 mg total) by mouth 2 (two) times daily as needed. (Patient taking differently: Take 0.25 mg by mouth 2 (two) times daily as needed for anxiety.)   [DISCONTINUED] atorvastatin (LIPITOR) 10 MG tablet TAKE 1 TABLET BY MOUTH ONCE DAILY   [DISCONTINUED] carvedilol (COREG) 6.25 MG tablet Take 1 tablet (6.25 mg total) by mouth 2 (two) times daily with a meal.   [DISCONTINUED] levocetirizine (XYZAL) 5 MG tablet Take 1 tablet (5 mg total) by mouth every evening.   [DISCONTINUED] levofloxacin (LEVAQUIN) 750 MG tablet Take 750 mg by mouth daily.   [DISCONTINUED] montelukast (SINGULAIR) 10 MG tablet Take 1 tablet (10 mg total) by mouth at  bedtime.   [DISCONTINUED] Oxycodone HCl 10 MG TABS Take 10 mg by mouth every 6 (six) hours as needed (pain.).   [DISCONTINUED] pioglitazone (ACTOS) 15 MG tablet Take 1 tablet (15 mg total) by mouth daily. (Patient taking differently: Take 15 mg by mouth in the morning.)   [DISCONTINUED] predniSONE (DELTASONE) 10 MG tablet Take 10-60 mg by mouth See admin instructions. Day 1 take 6 tablets Day  2 take 6 tablets Day 3 take 5 tablets Day 4 take 5 tablets Day 5 take 4 tablets Day 6 take 4 tablets Day 7 take 3 tablets Day 8 take 3 tablets Day 9 take 2 tablets Day 10 take 2 tablets Day 11 take 1 tablet Day 12 take 1 tablet   [DISCONTINUED] sertraline (ZOLOFT) 100 MG tablet Take 1 tablet (100 mg total) by mouth daily.   [DISCONTINUED] traZODone (DESYREL) 50 MG tablet Take 1.5 tablets (75 mg total) by mouth at bedtime. (Patient taking differently: Take 75 mg by mouth at bedtime as needed for sleep.)   [DISCONTINUED] valACYclovir (VALTREX) 1000 MG tablet Take 1,000 mg by mouth 3 (three) times daily.   No facility-administered encounter medications on file as of 02/07/2021.     Lab Results  Component Value Date   WBC 8.4 10/11/2020   HGB 14.3 10/11/2020   HCT 45.3 10/11/2020   PLT 219 10/11/2020   GLUCOSE 131 (H) 02/07/2021   CHOL 137 02/07/2021   TRIG 232.0 (H) 02/07/2021   HDL 37.70 (L) 02/07/2021   LDLDIRECT 79.0 02/07/2021   LDLCALC 58 11/21/2013   ALT 18 02/07/2021   AST 19 02/07/2021   NA 141 02/07/2021   K 4.2 02/07/2021   CL 102 02/07/2021   CREATININE 0.90 02/07/2021   BUN 12 02/07/2021   CO2 27 02/07/2021   TSH 0.81 02/07/2021   PSA 0.82 06/10/2019   INR 0.9 10/11/2020   HGBA1C 7.0 (H) 02/07/2021   MICROALBUR 1.5 10/18/2019    Chest 2 View  Result Date: 10/11/2020 CLINICAL DATA:  Preop evaluation for spine surgery EXAM: CHEST - 2 VIEW COMPARISON:  05/27/2016 FINDINGS: Stable cardiomegaly without CHF or pneumonia. Lungs remain clear. No effusion or pneumothorax. Aorta atherosclerotic. Degenerative changes of the spine with increased kyphosis. Chronic compression fracture at T12. IMPRESSION: Stable exam.  No active disease. Aortic Atherosclerosis (ICD10-I70.0). Electronically Signed   By: Jerilynn Mages.  Shick M.D.   On: 10/11/2020 13:09       Assessment & Plan:   Problem List Items Addressed This Visit     Anemia, iron deficiency    Follow cbc.       Aortic  atherosclerosis (HCC)    Continues on lipitor.       Relevant Medications   carvedilol (COREG) 12.5 MG tablet   Back pain    Back pain persists as outlined.  Refer to pain clinic.       Relevant Orders   Ambulatory referral to Pain Clinic   CAD (coronary artery disease), native coronary artery    No chest pain or sob.  Riding bike.  Continue coreg, lisinopril and statin.  Follow.       Relevant Medications   carvedilol (COREG) 12.5 MG tablet   Diabetes mellitus ([place])    Continues on trulicity and metformin.  Follow sugars.  Follow metabolic panel and D3O.       Relevant Orders   Hemoglobin A1c (Completed)   GERD (gastroesophageal reflux disease)    No upper symptoms reported.  Continue protonix.  Hypercholesterolemia    Continue lipitor.  Low cholesterol diet and exercise.  Follow lipid panel and liver function tests.        Relevant Medications   carvedilol (COREG) 12.5 MG tablet   Other Relevant Orders   Hepatic function panel (Completed)   Lipid panel (Completed)   TSH (Completed)   Hypertension - Primary    Continues on hctz, coreg, lisinopril and amlodipine. Cardiology recently increased coreg to 12.68m bid.  He has only been taking q day.  Increase to bid.  Follow pressures.  Follow metabolic panel.      Relevant Medications   carvedilol (COREG) 12.5 MG tablet   Other Relevant Orders   Basic metabolic panel (Completed)   Obstructive sleep apnea    Continue cpap.       S/P lumbar laminectomy    Leg pain is better.  Persistent back pain. Request referral to pain clinic.        Relevant Orders   Ambulatory referral to Pain Clinic   Stress    Continue zoloft.  Overall appears to be doing better.  Follow.        Other Visit Diagnoses     Need for immunization against influenza       Relevant Orders   Flu Vaccine QUAD High Dose(Fluad) (Completed)        CEinar Pheasant MD

## 2021-02-07 NOTE — Patient Instructions (Signed)
Increase carvedilol to twice a day.

## 2021-02-11 ENCOUNTER — Other Ambulatory Visit: Payer: Self-pay | Admitting: Internal Medicine

## 2021-02-11 DIAGNOSIS — R2689 Other abnormalities of gait and mobility: Secondary | ICD-10-CM | POA: Diagnosis not present

## 2021-02-11 DIAGNOSIS — R262 Difficulty in walking, not elsewhere classified: Secondary | ICD-10-CM | POA: Diagnosis not present

## 2021-02-11 DIAGNOSIS — R2681 Unsteadiness on feet: Secondary | ICD-10-CM | POA: Diagnosis not present

## 2021-02-11 DIAGNOSIS — M6281 Muscle weakness (generalized): Secondary | ICD-10-CM | POA: Diagnosis not present

## 2021-02-11 DIAGNOSIS — M5459 Other low back pain: Secondary | ICD-10-CM | POA: Diagnosis not present

## 2021-02-11 NOTE — Telephone Encounter (Signed)
RX Refill:xanax Last Seen:02-07-21 Last ordered:09-17-20

## 2021-02-12 NOTE — Telephone Encounter (Signed)
PDMP reviewed.  Rx ok'd for xanax #30 with no refills.

## 2021-02-13 ENCOUNTER — Ambulatory Visit: Payer: Medicare PPO | Admitting: Urology

## 2021-02-14 ENCOUNTER — Other Ambulatory Visit: Payer: Self-pay

## 2021-02-14 MED ORDER — ATORVASTATIN CALCIUM 10 MG PO TABS: 10.0000 mg | ORAL_TABLET | Freq: Every day | ORAL | 3 refills | Status: DC

## 2021-02-14 MED ORDER — MONTELUKAST SODIUM 10 MG PO TABS: 10.0000 mg | ORAL_TABLET | Freq: Every day | ORAL | 2 refills | Status: DC

## 2021-02-14 MED ORDER — TRAZODONE HCL 50 MG PO TABS
75.0000 mg | ORAL_TABLET | Freq: Every day | ORAL | 1 refills | Status: DC
Start: 2021-02-14 — End: 2021-12-11

## 2021-02-14 MED ORDER — LEVOCETIRIZINE DIHYDROCHLORIDE 5 MG PO TABS: 5.0000 mg | ORAL_TABLET | Freq: Every evening | ORAL | 2 refills | Status: DC

## 2021-02-14 MED ORDER — SERTRALINE HCL 100 MG PO TABS
100.0000 mg | ORAL_TABLET | Freq: Every day | ORAL | 1 refills | Status: DC
Start: 2021-02-14 — End: 2021-08-05

## 2021-02-14 MED ORDER — PIOGLITAZONE HCL 15 MG PO TABS: 15.0000 mg | ORAL_TABLET | Freq: Every day | ORAL | 1 refills | Status: DC

## 2021-02-15 DIAGNOSIS — R2689 Other abnormalities of gait and mobility: Secondary | ICD-10-CM | POA: Diagnosis not present

## 2021-02-15 DIAGNOSIS — R262 Difficulty in walking, not elsewhere classified: Secondary | ICD-10-CM | POA: Diagnosis not present

## 2021-02-15 DIAGNOSIS — M6281 Muscle weakness (generalized): Secondary | ICD-10-CM | POA: Diagnosis not present

## 2021-02-15 DIAGNOSIS — R2681 Unsteadiness on feet: Secondary | ICD-10-CM | POA: Diagnosis not present

## 2021-02-15 DIAGNOSIS — M5459 Other low back pain: Secondary | ICD-10-CM | POA: Diagnosis not present

## 2021-02-16 ENCOUNTER — Encounter: Payer: Self-pay | Admitting: Internal Medicine

## 2021-02-16 NOTE — Assessment & Plan Note (Addendum)
Continues on hctz, coreg, lisinopril and amlodipine. Cardiology recently increased coreg to 12.5mg  bid.  He has only been taking q day.  Increase to bid.  Follow pressures.  Follow metabolic panel.

## 2021-02-16 NOTE — Assessment & Plan Note (Signed)
Continue lipitor.  Low cholesterol diet and exercise.  Follow lipid panel and liver function tests.   

## 2021-02-16 NOTE — Assessment & Plan Note (Signed)
Continue cpap.  

## 2021-02-16 NOTE — Assessment & Plan Note (Signed)
Follow cbc.  

## 2021-02-16 NOTE — Assessment & Plan Note (Signed)
Continues on trulicity and metformin.  Follow sugars.  Follow metabolic panel and W9Q.

## 2021-02-16 NOTE — Assessment & Plan Note (Signed)
Continues on lipitor.  

## 2021-02-16 NOTE — Assessment & Plan Note (Signed)
No chest pain or sob.  Riding bike.  Continue coreg, lisinopril and statin.  Follow.

## 2021-02-16 NOTE — Assessment & Plan Note (Signed)
Leg pain is better.  Persistent back pain. Request referral to pain clinic.

## 2021-02-16 NOTE — Assessment & Plan Note (Signed)
Back pain persists as outlined.  Refer to pain clinic.

## 2021-02-16 NOTE — Assessment & Plan Note (Signed)
No upper symptoms reported.  Continue protonix.  

## 2021-02-16 NOTE — Assessment & Plan Note (Signed)
Continue zoloft.  Overall appears to be doing better.  Follow.

## 2021-02-18 DIAGNOSIS — R262 Difficulty in walking, not elsewhere classified: Secondary | ICD-10-CM | POA: Diagnosis not present

## 2021-02-18 DIAGNOSIS — R2681 Unsteadiness on feet: Secondary | ICD-10-CM | POA: Diagnosis not present

## 2021-02-18 DIAGNOSIS — M5459 Other low back pain: Secondary | ICD-10-CM | POA: Diagnosis not present

## 2021-02-18 DIAGNOSIS — M6281 Muscle weakness (generalized): Secondary | ICD-10-CM | POA: Diagnosis not present

## 2021-02-18 DIAGNOSIS — R2689 Other abnormalities of gait and mobility: Secondary | ICD-10-CM | POA: Diagnosis not present

## 2021-02-21 ENCOUNTER — Ambulatory Visit: Payer: Medicare PPO | Admitting: Urology

## 2021-02-21 ENCOUNTER — Encounter: Payer: Self-pay | Admitting: Urology

## 2021-02-21 ENCOUNTER — Other Ambulatory Visit: Payer: Self-pay

## 2021-02-21 VITALS — BP 147/69 | HR 71 | Ht 69.0 in | Wt 200.0 lb

## 2021-02-21 DIAGNOSIS — N138 Other obstructive and reflux uropathy: Secondary | ICD-10-CM | POA: Diagnosis not present

## 2021-02-21 DIAGNOSIS — N401 Enlarged prostate with lower urinary tract symptoms: Secondary | ICD-10-CM | POA: Diagnosis not present

## 2021-02-21 DIAGNOSIS — E291 Testicular hypofunction: Secondary | ICD-10-CM | POA: Diagnosis not present

## 2021-02-21 DIAGNOSIS — R32 Unspecified urinary incontinence: Secondary | ICD-10-CM | POA: Diagnosis not present

## 2021-02-21 LAB — BLADDER SCAN AMB NON-IMAGING

## 2021-02-21 MED ORDER — TAMSULOSIN HCL 0.4 MG PO CAPS: 0.4000 mg | ORAL_CAPSULE | Freq: Every day | ORAL | 3 refills | Status: DC

## 2021-02-21 MED ORDER — TESTOSTERONE CYPIONATE 200 MG/ML IM SOLN: 200.0000 mg | INTRAMUSCULAR | 2 refills | Status: DC

## 2021-02-21 NOTE — Progress Notes (Signed)
   02/21/2021 9:17 AM   Ricardo Nash 03-Sep-1945 [id number]  Reason for visit: Follow up low testosterone, ED, urinary symptoms  HPI: 75 year old comorbid male previously followed at [facility] urology, the most recently by Dr. Matilde Sprang here in July 2022 for the above issues.  Comorbidities include sleep apnea, diabetes, hypertension, depression on SSRI.  He has been on testosterone injections every 2 weeks long-term, and PSA has remained low less than 1.  This was originally prescribed by Dr. Jacqlyn Larsen, then continued by APP at [facility] urology.  He reports major changes in his mood, energy, and overall quality of life on the testosterone injections.  He has failed PDE 5 inhibitors as well as intracorporeal injections with Trimix for ED. He and his wife are no longer interested in pursuing treatment options for ED.  Regarding his urinary symptoms, he overall is doing well on Flomax twice daily.  He occasionally will have some mild urge incontinence that is minimally bothersome.  He does not have any nocturia when he uses the CPAP machine for sleep apnea.  I personally viewed and interpreted his CT from January 2019 that shows an 86 g prostate with no hydronephrosis or nephrolithiasis.  Testosterone refilled, risks and benefits discussed, RTC 6 months for PSA, H/H, testosterone level Continue Flomax twice daily for [place], MD  [facility] [address], [place], [place] [address] [phone]

## 2021-02-23 DIAGNOSIS — M545 Low back pain, unspecified: Secondary | ICD-10-CM | POA: Diagnosis not present

## 2021-02-23 DIAGNOSIS — M47896 Other spondylosis, lumbar region: Secondary | ICD-10-CM | POA: Diagnosis not present

## 2021-02-23 DIAGNOSIS — M25551 Pain in right hip: Secondary | ICD-10-CM | POA: Diagnosis not present

## 2021-02-23 DIAGNOSIS — S39012A Strain of muscle, fascia and tendon of lower back, initial encounter: Secondary | ICD-10-CM | POA: Diagnosis not present

## 2021-02-25 DIAGNOSIS — R2689 Other abnormalities of gait and mobility: Secondary | ICD-10-CM | POA: Diagnosis not present

## 2021-02-25 DIAGNOSIS — M5459 Other low back pain: Secondary | ICD-10-CM | POA: Diagnosis not present

## 2021-02-25 DIAGNOSIS — R262 Difficulty in walking, not elsewhere classified: Secondary | ICD-10-CM | POA: Diagnosis not present

## 2021-02-25 DIAGNOSIS — M6281 Muscle weakness (generalized): Secondary | ICD-10-CM | POA: Diagnosis not present

## 2021-02-25 DIAGNOSIS — R2681 Unsteadiness on feet: Secondary | ICD-10-CM | POA: Diagnosis not present

## 2021-03-04 DIAGNOSIS — R2681 Unsteadiness on feet: Secondary | ICD-10-CM | POA: Diagnosis not present

## 2021-03-04 DIAGNOSIS — R262 Difficulty in walking, not elsewhere classified: Secondary | ICD-10-CM | POA: Diagnosis not present

## 2021-03-04 DIAGNOSIS — M6281 Muscle weakness (generalized): Secondary | ICD-10-CM | POA: Diagnosis not present

## 2021-03-04 DIAGNOSIS — R2689 Other abnormalities of gait and mobility: Secondary | ICD-10-CM | POA: Diagnosis not present

## 2021-03-04 DIAGNOSIS — M5459 Other low back pain: Secondary | ICD-10-CM | POA: Diagnosis not present

## 2021-03-11 DIAGNOSIS — M6281 Muscle weakness (generalized): Secondary | ICD-10-CM | POA: Diagnosis not present

## 2021-03-11 DIAGNOSIS — R2681 Unsteadiness on feet: Secondary | ICD-10-CM | POA: Diagnosis not present

## 2021-03-11 DIAGNOSIS — M5459 Other low back pain: Secondary | ICD-10-CM | POA: Diagnosis not present

## 2021-03-11 DIAGNOSIS — R262 Difficulty in walking, not elsewhere classified: Secondary | ICD-10-CM | POA: Diagnosis not present

## 2021-03-11 DIAGNOSIS — R2689 Other abnormalities of gait and mobility: Secondary | ICD-10-CM | POA: Diagnosis not present

## 2021-03-18 DIAGNOSIS — M5459 Other low back pain: Secondary | ICD-10-CM | POA: Diagnosis not present

## 2021-03-18 DIAGNOSIS — R2681 Unsteadiness on feet: Secondary | ICD-10-CM | POA: Diagnosis not present

## 2021-03-18 DIAGNOSIS — M6281 Muscle weakness (generalized): Secondary | ICD-10-CM | POA: Diagnosis not present

## 2021-03-18 DIAGNOSIS — R2689 Other abnormalities of gait and mobility: Secondary | ICD-10-CM | POA: Diagnosis not present

## 2021-03-18 DIAGNOSIS — R262 Difficulty in walking, not elsewhere classified: Secondary | ICD-10-CM | POA: Diagnosis not present

## 2021-03-21 DIAGNOSIS — M5459 Other low back pain: Secondary | ICD-10-CM | POA: Diagnosis not present

## 2021-03-21 DIAGNOSIS — M6281 Muscle weakness (generalized): Secondary | ICD-10-CM | POA: Diagnosis not present

## 2021-03-21 DIAGNOSIS — R2681 Unsteadiness on feet: Secondary | ICD-10-CM | POA: Diagnosis not present

## 2021-03-21 DIAGNOSIS — R2689 Other abnormalities of gait and mobility: Secondary | ICD-10-CM | POA: Diagnosis not present

## 2021-03-21 DIAGNOSIS — R262 Difficulty in walking, not elsewhere classified: Secondary | ICD-10-CM | POA: Diagnosis not present

## 2021-03-25 DIAGNOSIS — M5459 Other low back pain: Secondary | ICD-10-CM | POA: Diagnosis not present

## 2021-03-25 DIAGNOSIS — R262 Difficulty in walking, not elsewhere classified: Secondary | ICD-10-CM | POA: Diagnosis not present

## 2021-03-25 DIAGNOSIS — M6281 Muscle weakness (generalized): Secondary | ICD-10-CM | POA: Diagnosis not present

## 2021-03-25 DIAGNOSIS — R2689 Other abnormalities of gait and mobility: Secondary | ICD-10-CM | POA: Diagnosis not present

## 2021-03-25 DIAGNOSIS — R2681 Unsteadiness on feet: Secondary | ICD-10-CM | POA: Diagnosis not present

## 2021-03-26 ENCOUNTER — Other Ambulatory Visit: Payer: Self-pay | Admitting: Internal Medicine

## 2021-03-26 DIAGNOSIS — E119 Type 2 diabetes mellitus without complications: Secondary | ICD-10-CM

## 2021-03-28 DIAGNOSIS — R2689 Other abnormalities of gait and mobility: Secondary | ICD-10-CM | POA: Diagnosis not present

## 2021-03-28 DIAGNOSIS — M5459 Other low back pain: Secondary | ICD-10-CM | POA: Diagnosis not present

## 2021-03-28 DIAGNOSIS — R262 Difficulty in walking, not elsewhere classified: Secondary | ICD-10-CM | POA: Diagnosis not present

## 2021-03-28 DIAGNOSIS — M6281 Muscle weakness (generalized): Secondary | ICD-10-CM | POA: Diagnosis not present

## 2021-03-28 DIAGNOSIS — R2681 Unsteadiness on feet: Secondary | ICD-10-CM | POA: Diagnosis not present

## 2021-04-01 DIAGNOSIS — H01009 Unspecified blepharitis unspecified eye, unspecified eyelid: Secondary | ICD-10-CM | POA: Diagnosis not present

## 2021-04-01 DIAGNOSIS — H353131 Nonexudative age-related macular degeneration, bilateral, early dry stage: Secondary | ICD-10-CM | POA: Diagnosis not present

## 2021-04-01 DIAGNOSIS — H1045 Other chronic allergic conjunctivitis: Secondary | ICD-10-CM | POA: Diagnosis not present

## 2021-04-01 DIAGNOSIS — E119 Type 2 diabetes mellitus without complications: Secondary | ICD-10-CM | POA: Diagnosis not present

## 2021-04-01 DIAGNOSIS — H04129 Dry eye syndrome of unspecified lacrimal gland: Secondary | ICD-10-CM | POA: Diagnosis not present

## 2021-04-01 LAB — HM DIABETES EYE EXAM

## 2021-04-02 DIAGNOSIS — M6281 Muscle weakness (generalized): Secondary | ICD-10-CM | POA: Diagnosis not present

## 2021-04-02 DIAGNOSIS — R262 Difficulty in walking, not elsewhere classified: Secondary | ICD-10-CM | POA: Diagnosis not present

## 2021-04-02 DIAGNOSIS — R2681 Unsteadiness on feet: Secondary | ICD-10-CM | POA: Diagnosis not present

## 2021-04-02 DIAGNOSIS — R2689 Other abnormalities of gait and mobility: Secondary | ICD-10-CM | POA: Diagnosis not present

## 2021-04-02 DIAGNOSIS — M5459 Other low back pain: Secondary | ICD-10-CM | POA: Diagnosis not present

## 2021-04-08 DIAGNOSIS — R262 Difficulty in walking, not elsewhere classified: Secondary | ICD-10-CM | POA: Diagnosis not present

## 2021-04-08 DIAGNOSIS — R2681 Unsteadiness on feet: Secondary | ICD-10-CM | POA: Diagnosis not present

## 2021-04-08 DIAGNOSIS — R2689 Other abnormalities of gait and mobility: Secondary | ICD-10-CM | POA: Diagnosis not present

## 2021-04-08 DIAGNOSIS — M6281 Muscle weakness (generalized): Secondary | ICD-10-CM | POA: Diagnosis not present

## 2021-04-08 DIAGNOSIS — M5459 Other low back pain: Secondary | ICD-10-CM | POA: Diagnosis not present

## 2021-04-11 DIAGNOSIS — M6281 Muscle weakness (generalized): Secondary | ICD-10-CM | POA: Diagnosis not present

## 2021-04-11 DIAGNOSIS — R2689 Other abnormalities of gait and mobility: Secondary | ICD-10-CM | POA: Diagnosis not present

## 2021-04-11 DIAGNOSIS — R2681 Unsteadiness on feet: Secondary | ICD-10-CM | POA: Diagnosis not present

## 2021-04-11 DIAGNOSIS — R262 Difficulty in walking, not elsewhere classified: Secondary | ICD-10-CM | POA: Diagnosis not present

## 2021-04-11 DIAGNOSIS — M5459 Other low back pain: Secondary | ICD-10-CM | POA: Diagnosis not present

## 2021-04-17 ENCOUNTER — Other Ambulatory Visit: Payer: Self-pay | Admitting: Internal Medicine

## 2021-04-17 NOTE — Telephone Encounter (Signed)
RX Refill: xanax Last Seen: 02-07-21 Last Ordered: 02-12-21 Next Appt: 06-10-21

## 2021-04-17 NOTE — Telephone Encounter (Signed)
Rx ok'd for xanax #30 with no refills.  ?

## 2021-04-23 ENCOUNTER — Encounter: Payer: Self-pay | Admitting: Internal Medicine

## 2021-04-24 NOTE — Telephone Encounter (Signed)
Patient pulse has been running around 70 and 168/72 and the lowest 140/64 taking BP once weekly at different times , CBG's have been good. Patient taking Lisinopril 40 mg and HCTZ 50 mg with carvedilol 12.5 mg in the morning in the evening he is taking Amlodipine 10 mg and Carvedilol 12.5 mg . He is going  to the gym and exercising as well has an appointment scheduled 06/10/21 but with BP stil high he wants to know should BP medication be increased.

## 2021-04-24 NOTE — Telephone Encounter (Signed)
Given persistent elevation, and on four medications, would like to schedule a f/u appt to discuss and see what would be best treatment change.  If agreeable can work in next week (12:00 - 12/212/22 - Wednesday

## 2021-04-25 NOTE — Telephone Encounter (Signed)
Appt already taken. Holding for work in appt per Dr Nicki Reaper

## 2021-04-25 NOTE — Telephone Encounter (Signed)
Patient scheduled.

## 2021-04-26 ENCOUNTER — Ambulatory Visit: Payer: Medicare PPO | Admitting: Internal Medicine

## 2021-04-26 ENCOUNTER — Other Ambulatory Visit: Payer: Self-pay

## 2021-04-26 VITALS — BP 152/74 | HR 78 | Temp 98.0°F | Resp 16 | Ht 69.0 in | Wt 201.0 lb

## 2021-04-26 DIAGNOSIS — E7800 Pure hypercholesterolemia, unspecified: Secondary | ICD-10-CM

## 2021-04-26 DIAGNOSIS — I10 Essential (primary) hypertension: Secondary | ICD-10-CM | POA: Diagnosis not present

## 2021-04-26 DIAGNOSIS — I2510 Atherosclerotic heart disease of native coronary artery without angina pectoris: Secondary | ICD-10-CM

## 2021-04-26 DIAGNOSIS — I700 Atherosclerosis of aorta: Secondary | ICD-10-CM

## 2021-04-26 LAB — BASIC METABOLIC PANEL
BUN: 22 mg/dL (ref 6–23)
CO2: 27 mEq/L (ref 19–32)
Calcium: 9.7 mg/dL (ref 8.4–10.5)
Chloride: 103 mEq/L (ref 96–112)
Creatinine, Ser: 0.93 mg/dL (ref 0.40–1.50)
GFR: 79.54 mL/min (ref >60.00)
Glucose, Bld: 170 mg/dL — ABNORMAL HIGH (ref 70–99)
Potassium: 3.9 mEq/L (ref 3.5–5.1)
Sodium: 139 mEq/L (ref 135–145)

## 2021-04-26 MED ORDER — CARVEDILOL 25 MG PO TABS: 25.0000 mg | ORAL_TABLET | Freq: Two times a day (BID) | ORAL | 3 refills | Status: DC

## 2021-04-26 NOTE — Patient Instructions (Signed)
Increase carvedilol to 25mg twice a day

## 2021-04-26 NOTE — Progress Notes (Signed)
Patient ID: Ricardo Nash, male   DOB: 31-Jul-1945, 75 y.o.   MRN: [id number]   Subjective:    Patient ID: Ricardo Nash, male    DOB: 01/24/1946, 75 y.o.   MRN: [id number]  This visit occurred during the SARS-CoV-2 public health emergency.  Safety protocols were in place, including screening questions prior to the visit, additional usage of staff PPE, and extensive cleaning of exam room while observing appropriate contact time as indicated for disinfecting solutions.   Patient here for work in  appt.    Chief Complaint  Patient presents with   Hypertension   .   Hypertension Pertinent negatives include no chest pain, headaches, palpitations or shortness of breath.  Here for work in elevated blood pressure.  Persistent elevation. He is currently taking lisinopril, hctz, amlodipine and carvedilol 12.78m bid.  He feels good.  Staying active.  Exercising.  No chest pain or sob.  Is walking 2 miles per day.  Lifts weights 3x/week.  No significant headache.  No dizziness or light headedness.  Feeling better.  Outside blood pressures:  lowest reading since 05/19/20 - 140/62.  (Highest 1627systolic).     Past Medical History:  Diagnosis Date   C. difficile colitis    Coronary artery disease    Depression    Diabetes mellitus ([place]    Diverticulitis    Diverticulosis    Dysrhythmia    PAF   Environmental allergies    GERD (gastroesophageal reflux disease)    Goiter    intrathoracic, s/p benign biopsy (Dr GHarlow Asa   Hypercholesterolemia    Hypertension    Osteoarthritis    cervical spine, lumbar spine   Paroxysmal atrial fibrillation (HCC)    Pneumonia    PONV (postoperative nausea and vomiting)    Sleep apnea    CPAP @ NIGHT   Stroke ([facility]    TIA (transient ischemic attack)    Past Surgical History:  Procedure Laterality Date   BACK SURGERY  8/08   s/p fusion of L4-L5   capsule endoscopy     CATARACT EXTRACTION  2011   Dr. HHerbert Deaner  CERVICAL DISC SURGERY  2002    COLONOSCOPY WITH PROPOFOL N/A 07/20/2017   Procedure: COLONOSCOPY WITH PROPOFOL;  Surgeon: EManya Silvas MD;  [facility]  Service: Endoscopy;  Laterality: N/A;   EYE SURGERY     cataract bilateral 10/1999   eyelid reduction     Dr. CKerby LessHERNIA REPAIR  1991   Dr, SCassell ClementLAMINECTOMY/DECOMPRESSION MICRODISCECTOMY N/A 10/15/2020   Procedure: Laminectomy - Lumbar Two-Lumbar Three - Lumbar Three-Lumbar Four with sublaminar decompression;  Surgeon: JEustace Moore MD;  [place] [facility]  Service: Neurosurgery;  Laterality: N/A;  Laminectomy - Lumbar Two-Lumbar Three - Lumbar Three-Lumbar Four with sublaminar decompression   NOSE SURGERY     turbinate reduction   SEPTOPLASTY  1975   SHOULDER SURGERY  2000   rotator cuff   Family History  Problem Relation Age of Onset   Congestive Heart Failure Father    Heart disease Father        myocardial infarction   Rheumatic fever Father        valvular disease   Heart disease Mother        s/p CABG (age 279   Kidney disease Sister    Colon cancer Neg Hx    Prostate cancer Neg Hx    Social History   Socioeconomic History  Marital status: Married    Spouse name: Not on file   Number of children: 2   Years of education: Not on file   Highest education level: Not on file  Occupational History   Occupation: retired Pharmacist, hospital  Tobacco Use   Smoking status: Former   Smokeless tobacco: Never  Scientific laboratory technician Use: Never used  Substance and Sexual Activity   Alcohol use: Yes    Alcohol/week: 0.0 standard drinks    Comment: occasional   Drug use: No   Sexual activity: Not Currently  Other Topics Concern   Not on file  Social History Narrative   Not on file   Social Determinants of Health   Financial Resource Strain: Low Risk    Difficulty of Paying Living Expenses: Not hard at all  Food Insecurity: No Food Insecurity   Worried About Charity fundraiser in the Last Year: Never true   [place] in the Last Year: Never true  Transportation Needs: No Transportation Needs   Lack of Transportation (Medical): No   Lack of Transportation (Non-Medical): No  Physical Activity: Sufficiently Active   Days of Exercise per Week: 5 days   Minutes of Exercise per Session: 60 min  Stress: No Stress Concern Present   Feeling of Stress : Not at all  Social Connections: Unknown   Frequency of Communication with Friends and Family: Not on file   Frequency of Social Gatherings with Friends and Family: Not on file   Attends Religious Services: Not on file   Active Member of Clubs or Organizations: Not on file   Attends Archivist Meetings: Not on file   Marital Status: Married     Review of Systems  Constitutional:  Negative for appetite change and unexpected weight change.  HENT:  Negative for sore throat.        No increased congestion.   Respiratory:  Negative for cough, chest tightness and shortness of breath.   Cardiovascular:  Negative for chest pain, palpitations and leg swelling.  Gastrointestinal:  Negative for abdominal pain, diarrhea, nausea and vomiting.  Genitourinary:  Negative for difficulty urinating and dysuria.  Musculoskeletal:  Negative for joint swelling and myalgias.  Skin:  Negative for color change and rash.  Neurological:  Negative for dizziness, light-headedness and headaches.  Psychiatric/Behavioral:  Negative for agitation and dysphoric mood.       Objective:     BP (!) 152/74    Pulse 78    Temp 98 F (36.7 C)    Resp 16    Wt 201 lb (91.2 kg)    SpO2 98%    BMI 29.68 kg/m  Wt Readings from Last 3 Encounters:  04/26/21 201 lb (91.2 kg)  02/21/21 200 lb (90.7 kg)  02/07/21 204 lb 9.6 oz (92.8 kg)    Physical Exam Constitutional:      General: He is not in acute distress.    Appearance: Normal appearance. He is well-developed.  HENT:     Head: Normocephalic and atraumatic.     Right Ear: External ear normal.     Left Ear: External ear  normal.  Eyes:     General: No scleral icterus.       Right eye: No discharge.        Left eye: No discharge.  Cardiovascular:     Rate and Rhythm: Normal rate and regular rhythm.  Pulmonary:     Effort: Pulmonary effort is normal. No  respiratory distress.     Breath sounds: Normal breath sounds.  Abdominal:     General: Bowel sounds are normal.     Palpations: Abdomen is soft.     Tenderness: There is no abdominal tenderness.  Musculoskeletal:        General: No swelling or tenderness.     Cervical back: Neck supple. No tenderness.  Lymphadenopathy:     Cervical: No cervical adenopathy.  Skin:    Findings: No erythema or rash.  Neurological:     Mental Status: He is alert.  Psychiatric:        Mood and Affect: Mood normal.        Behavior: Behavior normal.     Outpatient Encounter Medications as of 04/26/2021  Medication Sig   carvedilol (COREG) 25 MG tablet Take 1 tablet (25 mg total) by mouth 2 (two) times daily with a meal.   ACCU-CHEK GUIDE test strip USE AS DIRECTED TO TEST BLOOD GLUCOSE LEVELS TWICE DAILY   ALPRAZolam (XANAX) 0.25 MG tablet TAKE 1 TABLET BY MOUTH ONCE DAILY AS NEEDED.   amLODipine (NORVASC) 10 MG tablet TAKE 1 TABLET BY MOUTH ONCE DAILY   Ascorbic Acid (VITAMIN C) 100 MG tablet Take 100 mg by mouth in the morning.   aspirin EC 81 MG tablet Take 81 mg by mouth every evening. Swallow whole.   atorvastatin (LIPITOR) 10 MG tablet Take 1 tablet (10 mg total) by mouth daily.   Azelastine-Fluticasone (DYMISTA NA) Place 1 spray into the nose in the morning and at bedtime.   blood glucose meter kit and supplies Dispense based on patient and insurance preference. Use up to four times daily as directed. (FOR ICD-10 E10.9, E11.9).   hydrochlorothiazide (HYDRODIURIL) 50 MG tablet TAKE 1 TABLET BY MOUTH ONCE DAILY   levocetirizine (XYZAL) 5 MG tablet Take 1 tablet (5 mg total) by mouth every evening.   lisinopril (ZESTRIL) 40 MG tablet TAKE 1 TABLET BY MOUTH ONCE  DAILY   metFORMIN (GLUCOPHAGE) 1000 MG tablet TAKE 1 TABLET BY MOUTH TWICE DAILY WITH MEALS   montelukast (SINGULAIR) 10 MG tablet Take 1 tablet (10 mg total) by mouth at bedtime.   Multiple Vitamin (MULTIVITAMIN WITH MINERALS) TABS tablet Take 1 tablet by mouth in the morning.   mupirocin cream (BACTROBAN) 2 % Apply 1 application topically 2 (two) times daily.   pantoprazole (PROTONIX) 40 MG tablet TAKE 1 TABLET BY MOUTH ONCE DAILY   pioglitazone (ACTOS) 15 MG tablet Take 1 tablet (15 mg total) by mouth daily.   sertraline (ZOLOFT) 100 MG tablet Take 1 tablet (100 mg total) by mouth daily.   tamsulosin (FLOMAX) 0.4 MG CAPS capsule Take 1 capsule (0.4 mg total) by mouth daily with breakfast.   testosterone cypionate (DEPOTESTOSTERONE CYPIONATE) 200 MG/ML injection Inject 1 mL (200 mg total) into the muscle every 14 (fourteen) days.   traZODone (DESYREL) 50 MG tablet Take 1.5 tablets (75 mg total) by mouth at bedtime.   TRULICITY 7.65 YY/5.0PT SOPN INJECT 0.75MG SUBQ ONCE A WEEK   [DISCONTINUED] carvedilol (COREG) 12.5 MG tablet Take 12.5 mg by mouth daily.   [DISCONTINUED] methocarbamol (ROBAXIN) 500 MG tablet Take 1 tablet (500 mg total) by mouth 4 (four) times daily.   [DISCONTINUED] oxyCODONE-acetaminophen (PERCOCET) 5-325 MG tablet Take 1 tablet by mouth every 4 (four) hours as needed for severe pain.   No facility-administered encounter medications on file as of 04/26/2021.     Lab Results  Component Value Date   WBC  8.4 10/11/2020   HGB 14.3 10/11/2020   HCT 45.3 10/11/2020   PLT 219 10/11/2020   GLUCOSE 170 (H) 04/26/2021   CHOL 137 02/07/2021   TRIG 232.0 (H) 02/07/2021   HDL 37.70 (L) 02/07/2021   LDLDIRECT 79.0 02/07/2021   LDLCALC 58 11/21/2013   ALT 18 02/07/2021   AST 19 02/07/2021   NA 139 04/26/2021   K 3.9 04/26/2021   CL 103 04/26/2021   CREATININE 0.93 04/26/2021   BUN 22 04/26/2021   CO2 27 04/26/2021   TSH 0.81 02/07/2021   PSA 0.82 06/10/2019   INR 0.9  10/11/2020   HGBA1C 7.0 (H) 02/07/2021   MICROALBUR 1.5 10/18/2019    DG Lumbar Spine 2-3 Views  Result Date: 10/15/2020 CLINICAL DATA:  L2-3 and L3-4 decompression. EXAM: LUMBAR SPINE - 2-3 VIEW COMPARISON:  09/20/2020. FINDINGS: 2 cross-table lateral are submitted. The first image views of the lumbar spine, taken at 0753 hours, shows an L4-5 posterior lumbar interbody fusion. Multilevel endplate degenerative changes. Remote T12 compression fracture. Subsequent image, taken at 0814 hours, shows surgical instrument tips projecting posterior to the L2-3 and L3-4 facet joints. IMPRESSION: 1. Intraoperative localization at L2-3 and L3-4. 2. L4-5 posterior lumbar interbody fusion. 3. Remote T12 compression fracture. Electronically Signed   By: Lorin Picket M.D.   On: 10/15/2020 11:04       Assessment & Plan:   Problem List Items Addressed This Visit     Aortic atherosclerosis ([place])    Continues on lipitor.       Relevant Medications   carvedilol (COREG) 25 MG tablet   CAD (coronary artery disease), native coronary artery    No chest pain or sob.  Riding bike.  Continue coreg, lisinopril and statin.  Follow.       Relevant Medications   carvedilol (COREG) 25 MG tablet   Hypercholesterolemia    Continue lipitor.  Low cholesterol diet and exercise.  Follow lipid panel and liver function tests.        Relevant Medications   carvedilol (COREG) 25 MG tablet   Hypertension - Primary    Continues on hctz, coreg, lisinopril and amlodipine. On coreg to 12.35m bid.  Pulse remaining in 70s.  Increase coreg to 270mbid.  Follow pressures.  Follow metabolic panel.      Relevant Medications   carvedilol (COREG) 25 MG tablet   Other Relevant Orders   Basic metabolic panel (Completed)     ChEinar PheasantMD

## 2021-04-27 ENCOUNTER — Encounter: Payer: Self-pay | Admitting: Internal Medicine

## 2021-04-27 NOTE — Assessment & Plan Note (Signed)
No chest pain or sob.  Riding bike.  Continue coreg, lisinopril and statin.  Follow.

## 2021-04-27 NOTE — Assessment & Plan Note (Signed)
Continues on hctz, coreg, lisinopril and amlodipine. On coreg to 12.5mg  bid.  Pulse remaining in 70s.  Increase coreg to 25mg  bid.  Follow pressures.  Follow metabolic panel.

## 2021-04-27 NOTE — Assessment & Plan Note (Signed)
Continue lipitor.  Low cholesterol diet and exercise.  Follow lipid panel and liver function tests.   

## 2021-04-27 NOTE — Assessment & Plan Note (Signed)
Continues on lipitor.  

## 2021-05-06 ENCOUNTER — Other Ambulatory Visit: Payer: Self-pay | Admitting: Internal Medicine

## 2021-05-08 ENCOUNTER — Encounter: Payer: Self-pay | Admitting: Urology

## 2021-05-15 DIAGNOSIS — J329 Chronic sinusitis, unspecified: Secondary | ICD-10-CM | POA: Diagnosis not present

## 2021-05-15 DIAGNOSIS — G510 Bell's palsy: Secondary | ICD-10-CM | POA: Diagnosis not present

## 2021-05-16 DIAGNOSIS — G5133 Clonic hemifacial spasm, bilateral: Secondary | ICD-10-CM | POA: Diagnosis not present

## 2021-05-16 DIAGNOSIS — R1311 Dysphagia, oral phase: Secondary | ICD-10-CM | POA: Diagnosis not present

## 2021-05-16 DIAGNOSIS — G510 Bell's palsy: Secondary | ICD-10-CM | POA: Diagnosis not present

## 2021-05-16 DIAGNOSIS — R471 Dysarthria and anarthria: Secondary | ICD-10-CM | POA: Diagnosis not present

## 2021-05-21 ENCOUNTER — Telehealth: Payer: Self-pay | Admitting: Internal Medicine

## 2021-05-21 ENCOUNTER — Other Ambulatory Visit: Payer: Self-pay

## 2021-05-21 MED ORDER — AZELASTINE-FLUTICASONE 137-50 MCG/ACT NA SUSP: 1.0000 | Freq: Two times a day (BID) | NASAL | 3 refills | Status: DC

## 2021-05-21 NOTE — Telephone Encounter (Signed)
Ok to refill dymista

## 2021-05-21 NOTE — Telephone Encounter (Signed)
Clarified with patient. He was requesting xyzal, Singulair, and dymista nasal spray. We have already sent in the xyzal and singulair. Ok to send in dymista for him? Previously prescribed by Dr Donneta Romberg

## 2021-05-21 NOTE — Telephone Encounter (Signed)
Patient called and said the last time he spoke to Dr Nicki Reaper she said she would fill his [place], [place], and [place]. Patient is requesting these to be refilled.

## 2021-05-21 NOTE — Telephone Encounter (Signed)
Dymista sent in

## 2021-05-30 ENCOUNTER — Other Ambulatory Visit: Payer: Self-pay | Admitting: Internal Medicine

## 2021-06-06 ENCOUNTER — Other Ambulatory Visit: Payer: Self-pay | Admitting: Internal Medicine

## 2021-06-10 ENCOUNTER — Ambulatory Visit (INDEPENDENT_AMBULATORY_CARE_PROVIDER_SITE_OTHER): Payer: Medicare PPO | Admitting: Internal Medicine

## 2021-06-10 ENCOUNTER — Encounter: Payer: Self-pay | Admitting: Internal Medicine

## 2021-06-10 ENCOUNTER — Other Ambulatory Visit: Payer: Self-pay

## 2021-06-10 VITALS — BP 130/76 | HR 64 | Temp 97.8°F | Resp 16 | Ht 69.0 in | Wt 203.0 lb

## 2021-06-10 DIAGNOSIS — Z0000 Encounter for general adult medical examination without abnormal findings: Secondary | ICD-10-CM

## 2021-06-10 DIAGNOSIS — E7800 Pure hypercholesterolemia, unspecified: Secondary | ICD-10-CM | POA: Diagnosis not present

## 2021-06-10 DIAGNOSIS — E119 Type 2 diabetes mellitus without complications: Secondary | ICD-10-CM

## 2021-06-10 DIAGNOSIS — I2510 Atherosclerotic heart disease of native coronary artery without angina pectoris: Secondary | ICD-10-CM | POA: Diagnosis not present

## 2021-06-10 DIAGNOSIS — I10 Essential (primary) hypertension: Secondary | ICD-10-CM

## 2021-06-10 DIAGNOSIS — E1159 Type 2 diabetes mellitus with other circulatory complications: Secondary | ICD-10-CM

## 2021-06-10 DIAGNOSIS — F419 Anxiety disorder, unspecified: Secondary | ICD-10-CM

## 2021-06-10 DIAGNOSIS — I700 Atherosclerosis of aorta: Secondary | ICD-10-CM | POA: Diagnosis not present

## 2021-06-10 DIAGNOSIS — F439 Reaction to severe stress, unspecified: Secondary | ICD-10-CM

## 2021-06-10 DIAGNOSIS — K219 Gastro-esophageal reflux disease without esophagitis: Secondary | ICD-10-CM

## 2021-06-10 DIAGNOSIS — M25562 Pain in left knee: Secondary | ICD-10-CM

## 2021-06-10 DIAGNOSIS — G4733 Obstructive sleep apnea (adult) (pediatric): Secondary | ICD-10-CM | POA: Diagnosis not present

## 2021-06-10 DIAGNOSIS — R194 Change in bowel habit: Secondary | ICD-10-CM

## 2021-06-10 LAB — BASIC METABOLIC PANEL
BUN: 19 mg/dL (ref 6–23)
CO2: 31 mEq/L (ref 19–32)
Calcium: 9.9 mg/dL (ref 8.4–10.5)
Chloride: 100 mEq/L (ref 96–112)
Creatinine, Ser: 0.96 mg/dL (ref 0.40–1.50)
GFR: 76.5 mL/min (ref >60.00)
Glucose, Bld: 137 mg/dL — ABNORMAL HIGH (ref 70–99)
Potassium: 4.4 mEq/L (ref 3.5–5.1)
Sodium: 140 mEq/L (ref 135–145)

## 2021-06-10 LAB — LIPID PANEL
Cholesterol: 152 mg/dL (ref 0–200)
HDL: 34.3 mg/dL — ABNORMAL LOW (ref >39.00)
NonHDL: 117.45
Total CHOL/HDL Ratio: 4
Triglycerides: 251 mg/dL — ABNORMAL HIGH (ref 0.0–149.0)
VLDL: 50.2 mg/dL — ABNORMAL HIGH (ref 0.0–40.0)

## 2021-06-10 LAB — HEPATIC FUNCTION PANEL
ALT: 15 U/L (ref 0–53)
AST: 16 U/L (ref 0–37)
Albumin: 4.2 g/dL (ref 3.5–5.2)
Alkaline Phosphatase: 46 U/L (ref 39–117)
Bilirubin, Direct: 0.1 mg/dL (ref 0.0–0.3)
Total Bilirubin: 0.6 mg/dL (ref 0.2–1.2)
Total Protein: 6.5 g/dL (ref 6.0–8.3)

## 2021-06-10 LAB — HEMOGLOBIN A1C: Hgb A1c MFr Bld: 7 % — ABNORMAL HIGH (ref 4.6–6.5)

## 2021-06-10 LAB — LDL CHOLESTEROL, DIRECT: Direct LDL: 91 mg/dL

## 2021-06-10 NOTE — Assessment & Plan Note (Signed)
Physical today 06/10/21.  Followed by urology for prostate checks.  Colonoscopy 07/2017 - tubular adenomatous polyps

## 2021-06-10 NOTE — Progress Notes (Signed)
Patient ID: Ricardo Nash, male   DOB: [birth year], 76 y.o.   MRN: [id number]   Subjective:    Patient ID: Ricardo Nash, male    DOB: 08-Dec-1945, 76 y.o.   MRN: [id number]  This visit occurred during the SARS-CoV-2 public health emergency.  Safety protocols were in place, including screening questions prior to the visit, additional usage of staff PPE, and extensive cleaning of exam room while observing appropriate contact time as indicated for disinfecting solutions.   Patient here for his physical.   Chief Complaint  Patient presents with   Annual Exam   .   HPI He is doing relatively well.  Last visit, coreg was increased to 30m bid.  Blood pressures doing better.  Reviewed outside readings.  No chest pain or sob reported.  No abdominal pain.  Still with some sinus/allergy issues.  Followed by ENT.  On singulair and uses nasal sprays regularly.  Some episodes of urgency - having a bowel movement.  Still episodes of alternating constipation and loose stool.  Discussed adding fiber.  Left knee pain - affects walking - pain and some localized swelling.  Planning to see [facility] prior to his cruise.     Past Medical History:  Diagnosis Date   C. difficile colitis    Coronary artery disease    Depression    [place]    Diverticulitis    Diverticulosis    Dysrhythmia    PAF   Environmental allergies    GERD (gastroesophageal reflux disease)    Goiter    intrathoracic, s/p benign biopsy (Dr GHarlow Asa   Hypercholesterolemia    Hypertension    Osteoarthritis    cervical spine, lumbar spine   Paroxysmal atrial fibrillation (HCC)    Pneumonia    PONV (postoperative nausea and vomiting)    Sleep apnea    CPAP @ NIGHT   Stroke ([facility]    TIA (transient ischemic attack)    Past Surgical History:  Procedure Laterality Date   BACK SURGERY  8/08   s/p fusion of L4-L5   capsule endoscopy     CATARACT EXTRACTION  2011   Dr. HHerbert Deaner  CERVICAL DISC SURGERY   2002   COLONOSCOPY WITH PROPOFOL N/A 07/20/2017   Procedure: COLONOSCOPY WITH PROPOFOL;  Surgeon: EManya Silvas MD;  [facility]  Service: Endoscopy;  Laterality: N/A;   EYE SURGERY     cataract bilateral 10/1999   eyelid reduction     Dr. CKerby LessHERNIA REPAIR  1991   Dr, SCassell ClementLAMINECTOMY/DECOMPRESSION MICRODISCECTOMY N/A 10/15/2020   Procedure: Laminectomy - Lumbar Two-Lumbar Three - Lumbar Three-Lumbar Four with sublaminar decompression;  Surgeon: JEustace Moore MD;  [place]  Service: Neurosurgery;  Laterality: N/A;  Laminectomy - Lumbar Two-Lumbar Three - Lumbar Three-Lumbar Four with sublaminar decompression   NOSE SURGERY     turbinate reduction   SEPTOPLASTY  1975   SHOULDER SURGERY  2000   rotator cuff   Family History  Problem Relation Age of Onset   Congestive Heart Failure Father    Heart disease Father        myocardial infarction   Rheumatic fever Father        valvular disease   Heart disease Mother        s/p CABG (age 77   Kidney disease Sister    Colon cancer Neg Hx    Prostate cancer Neg Hx  Social History   Socioeconomic History   Marital status: Married    Spouse name: Not on file   Number of children: 2   Years of education: Not on file   Highest education level: Not on file  Occupational History   Occupation: retired Pharmacist, hospital  Tobacco Use   Smoking status: Former   Smokeless tobacco: Never  Scientific laboratory technician Use: Never used  Substance and Sexual Activity   Alcohol use: Yes    Alcohol/week: 0.0 standard drinks    Comment: occasional   Drug use: No   Sexual activity: Not Currently  Other Topics Concern   Not on file  Social History Narrative   Not on file   Social Determinants of Health   Financial Resource Strain: Low Risk    Difficulty of Paying Living Expenses: Not hard at all  Food Insecurity: No Food Insecurity   Worried About Charity fundraiser in the Last Year: Never true   [place] in the Last Year: Never true  Transportation Needs: No Transportation Needs   Lack of Transportation (Medical): No   Lack of Transportation (Non-Medical): No  Physical Activity: Sufficiently Active   Days of Exercise per Week: 5 days   Minutes of Exercise per Session: 60 min  Stress: No Stress Concern Present   Feeling of Stress : Not at all  Social Connections: Unknown   Frequency of Communication with Friends and Family: Not on file   Frequency of Social Gatherings with Friends and Family: Not on file   Attends Religious Services: Not on file   Active Member of Clubs or Organizations: Not on file   Attends Archivist Meetings: Not on file   Marital Status: Married     Review of Systems  Constitutional:  Negative for appetite change and unexpected weight change.  HENT:  Negative for congestion, sinus pressure and sore throat.   Eyes:  Negative for pain and visual disturbance.  Respiratory:  Negative for cough, chest tightness and shortness of breath.   Cardiovascular:  Negative for chest pain, palpitations and leg swelling.  Gastrointestinal:  Negative for abdominal pain, nausea and vomiting.       Bowel symptoms as outlined.    Genitourinary:  Negative for difficulty urinating and dysuria.  Musculoskeletal:  Negative for joint swelling and myalgias.  Skin:  Negative for color change and rash.  Neurological:  Negative for dizziness, light-headedness and headaches.  Hematological:  Negative for adenopathy. Does not bruise/bleed easily.  Psychiatric/Behavioral:  Negative for agitation and dysphoric mood.       Objective:     BP 130/76    Pulse 64    Temp 97.8 F (36.6 C)    Resp 16    Ht 5' 9" (1.753 m)    Wt 203 lb (92.1 kg)    SpO2 97%    BMI 29.98 kg/m  Wt Readings from Last 3 Encounters:  06/21/21 203 lb (92.1 kg)  06/10/21 203 lb (92.1 kg)  04/26/21 201 lb (91.2 kg)    Physical Exam Constitutional:      General: He is not in acute distress.     Appearance: Normal appearance. He is well-developed.  HENT:     Head: Normocephalic and atraumatic.     Right Ear: External ear normal.     Left Ear: External ear normal.  Eyes:     General: No scleral icterus.       Right eye: No discharge.  Left eye: No discharge.     Conjunctiva/sclera: Conjunctivae normal.  Neck:     Thyroid: No thyromegaly.  Cardiovascular:     Rate and Rhythm: Normal rate and regular rhythm.  Pulmonary:     Effort: No respiratory distress.     Breath sounds: Normal breath sounds. No wheezing.  Abdominal:     General: Bowel sounds are normal.     Palpations: Abdomen is soft.     Tenderness: There is no abdominal tenderness.  Musculoskeletal:        General: No swelling or tenderness.     Cervical back: Neck supple. No tenderness.  Lymphadenopathy:     Cervical: No cervical adenopathy.  Skin:    Findings: No erythema or rash.  Neurological:     Mental Status: He is alert and oriented to person, place, and time.  Psychiatric:        Mood and Affect: Mood normal.        Behavior: Behavior normal.     Outpatient Encounter Medications as of 06/10/2021  Medication Sig   ACCU-CHEK GUIDE test strip USE AS DIRECTED TO TEST BLOOD GLUCOSE LEVELS TWICE DAILY   ALPRAZolam (XANAX) 0.25 MG tablet TAKE 1 TABLET BY MOUTH ONCE DAILY AS NEEDED.   amLODipine (NORVASC) 10 MG tablet TAKE 1 TABLET BY MOUTH ONCE DAILY   Ascorbic Acid (VITAMIN C) 100 MG tablet Take 100 mg by mouth in the morning.   aspirin EC 81 MG tablet Take 81 mg by mouth every evening. Swallow whole.   Azelastine-Fluticasone (DYMISTA) 137-50 MCG/ACT SUSP Place 1 spray into the nose in the morning and at bedtime.   blood glucose meter kit and supplies Dispense based on patient and insurance preference. Use up to four times daily as directed. (FOR ICD-10 E10.9, E11.9).   carvedilol (COREG) 25 MG tablet Take 1 tablet (25 mg total) by mouth 2 (two) times daily with a meal.   Dulaglutide (TRULICITY)  8.52 DP/8.2UM SOPN INJECT 0.75MG SUBQ ONCE A WEEK   hydrochlorothiazide (HYDRODIURIL) 50 MG tablet TAKE 1 TABLET BY MOUTH ONCE DAILY   levocetirizine (XYZAL) 5 MG tablet Take 1 tablet (5 mg total) by mouth every evening.   lisinopril (ZESTRIL) 40 MG tablet TAKE 1 TABLET BY MOUTH ONCE DAILY   metFORMIN (GLUCOPHAGE) 1000 MG tablet TAKE 1 TABLET BY MOUTH TWICE DAILY WITH MEALS   montelukast (SINGULAIR) 10 MG tablet Take 1 tablet (10 mg total) by mouth at bedtime.   Multiple Vitamin (MULTIVITAMIN WITH MINERALS) TABS tablet Take 1 tablet by mouth in the morning.   pantoprazole (PROTONIX) 40 MG tablet TAKE 1 TABLET BY MOUTH ONCE DAILY   pioglitazone (ACTOS) 15 MG tablet Take 1 tablet (15 mg total) by mouth daily.   sertraline (ZOLOFT) 100 MG tablet Take 1 tablet (100 mg total) by mouth daily.   tamsulosin (FLOMAX) 0.4 MG CAPS capsule Take 1 capsule (0.4 mg total) by mouth daily with breakfast.   testosterone cypionate (DEPOTESTOSTERONE CYPIONATE) 200 MG/ML injection Inject 1 mL (200 mg total) into the muscle every 14 (fourteen) days.   traZODone (DESYREL) 50 MG tablet Take 1.5 tablets (75 mg total) by mouth at bedtime.   [DISCONTINUED] atorvastatin (LIPITOR) 10 MG tablet Take 1 tablet (10 mg total) by mouth daily.   [DISCONTINUED] mupirocin cream (BACTROBAN) 2 % Apply 1 application topically 2 (two) times daily.   [DISCONTINUED] TRULICITY 3.53 IR/4.4RX SOPN INJECT 0.75MG SUBQ ONCE A WEEK   No facility-administered encounter medications on file as of 06/10/2021.  Lab Results  Component Value Date   WBC 8.4 10/11/2020   HGB 14.3 10/11/2020   HCT 45.3 10/11/2020   PLT 219 10/11/2020   GLUCOSE 137 (H) 06/10/2021   CHOL 152 06/10/2021   TRIG 251.0 (H) 06/10/2021   HDL 34.30 (L) 06/10/2021   LDLDIRECT 91.0 06/10/2021   LDLCALC 58 11/21/2013   ALT 15 06/10/2021   AST 16 06/10/2021   NA 140 06/10/2021   K 4.4 06/10/2021   CL 100 06/10/2021   CREATININE 0.96 06/10/2021   BUN 19 06/10/2021    CO2 31 06/10/2021   TSH 0.81 02/07/2021   PSA 0.82 06/10/2019   INR 0.9 10/11/2020   HGBA1C 7.0 (H) 06/10/2021   MICROALBUR 1.5 10/18/2019    DG Lumbar Spine 2-3 Views  Result Date: 10/15/2020 CLINICAL DATA:  L2-3 and L3-4 decompression. EXAM: LUMBAR SPINE - 2-3 VIEW COMPARISON:  09/20/2020. FINDINGS: 2 cross-table lateral are submitted. The first image views of the lumbar spine, taken at 0753 hours, shows an L4-5 posterior lumbar interbody fusion. Multilevel endplate degenerative changes. Remote T12 compression fracture. Subsequent image, taken at 0814 hours, shows surgical instrument tips projecting posterior to the L2-3 and L3-4 facet joints. IMPRESSION: 1. Intraoperative localization at L2-3 and L3-4. 2. L4-5 posterior lumbar interbody fusion. 3. Remote T12 compression fracture. Electronically Signed   By: Lorin Picket M.D.   On: 10/15/2020 11:04       Assessment & Plan:   Problem List Items Addressed This Visit     Anxiety    On zoloft.  Follow.  Doing better.        Aortic atherosclerosis (HCC)    Continues on lipitor.       CAD (coronary artery disease), native coronary artery    No chest pain or sob.  Riding bike.  Continue coreg, lisinopril and statin.  Follow.       Change in bowel habits    Persistent bowel changes.  Bowel urgency.  Alternating diarrhea and constipation.  Discussed taking benefiber daily. History of polyps.  Last colonoscopy 2019.  Request given persistent bowel changes, referral to GI.        Relevant Orders   Ambulatory referral to Gastroenterology   Diabetes mellitus ([place]) - Primary    Continues on trulicity and metformin.  Follow sugars.  Follow metabolic panel and A8T.       Relevant Medications   Dulaglutide (TRULICITY) 4.19 QQ/2.2LN SOPN   Other Relevant Orders   Hemoglobin A1c (Completed)   Basic metabolic panel   Hemoglobin A1c   GERD (gastroesophageal reflux disease)    No upper symptoms reported.  Continue protonix.        Health care maintenance    Physical today 06/10/21.  Followed by urology for prostate checks.  Colonoscopy 07/2017 - tubular adenomatous polyps       Hypercholesterolemia    Continue lipitor.  Low cholesterol diet and exercise.  Follow lipid panel and liver function tests.        Relevant Orders   Lipid panel (Completed)   Hepatic function panel (Completed)   Basic metabolic panel (Completed)   CBC with Differential/Platelet   Hepatic function panel   Lipid panel   Hypertension    Continues on hctz, coreg, lisinopril and amlodipine. On coreg to 82m bid.  Pressure better.  Follow pressures.  Follow metabolic panel.      Left knee pain    Knee pain as outlined.  Walking aggravates.  Still exercising.  Plans  f/u with [facility].       Obstructive sleep apnea    Continue cpap.       Stress    Continue zoloft.  Overall appears to be doing better.  Follow.          Einar Pheasant, MD

## 2021-06-10 NOTE — Patient Instructions (Signed)
Benefiber - daily 

## 2021-06-11 ENCOUNTER — Other Ambulatory Visit: Payer: Self-pay

## 2021-06-11 MED ORDER — ATORVASTATIN CALCIUM 20 MG PO TABS: 20.0000 mg | ORAL_TABLET | Freq: Every day | ORAL | 1 refills | Status: DC

## 2021-06-19 DIAGNOSIS — M25562 Pain in left knee: Secondary | ICD-10-CM | POA: Diagnosis not present

## 2021-06-19 DIAGNOSIS — M2391 Unspecified internal derangement of right knee: Secondary | ICD-10-CM | POA: Diagnosis not present

## 2021-06-19 DIAGNOSIS — M25561 Pain in right knee: Secondary | ICD-10-CM | POA: Diagnosis not present

## 2021-06-19 DIAGNOSIS — M2392 Unspecified internal derangement of left knee: Secondary | ICD-10-CM | POA: Diagnosis not present

## 2021-06-21 ENCOUNTER — Ambulatory Visit (INDEPENDENT_AMBULATORY_CARE_PROVIDER_SITE_OTHER): Payer: Medicare PPO

## 2021-06-21 VITALS — Ht 69.0 in | Wt 203.0 lb

## 2021-06-21 DIAGNOSIS — J309 Allergic rhinitis, unspecified: Secondary | ICD-10-CM | POA: Diagnosis not present

## 2021-06-21 DIAGNOSIS — E785 Hyperlipidemia, unspecified: Secondary | ICD-10-CM | POA: Diagnosis not present

## 2021-06-21 DIAGNOSIS — Z7982 Long term (current) use of aspirin: Secondary | ICD-10-CM | POA: Diagnosis not present

## 2021-06-21 DIAGNOSIS — Z0000 Encounter for general adult medical examination without abnormal findings: Secondary | ICD-10-CM

## 2021-06-21 DIAGNOSIS — Z6833 Body mass index (BMI) 33.0-33.9, adult: Secondary | ICD-10-CM | POA: Diagnosis not present

## 2021-06-21 DIAGNOSIS — H3530 Unspecified macular degeneration: Secondary | ICD-10-CM | POA: Diagnosis not present

## 2021-06-21 DIAGNOSIS — M1990 Unspecified osteoarthritis, unspecified site: Secondary | ICD-10-CM | POA: Diagnosis not present

## 2021-06-21 DIAGNOSIS — E669 Obesity, unspecified: Secondary | ICD-10-CM | POA: Diagnosis not present

## 2021-06-21 DIAGNOSIS — F324 Major depressive disorder, single episode, in partial remission: Secondary | ICD-10-CM | POA: Diagnosis not present

## 2021-06-21 DIAGNOSIS — E1142 Type 2 diabetes mellitus with diabetic polyneuropathy: Secondary | ICD-10-CM | POA: Diagnosis not present

## 2021-06-21 DIAGNOSIS — E1159 Type 2 diabetes mellitus with other circulatory complications: Secondary | ICD-10-CM | POA: Diagnosis not present

## 2021-06-21 DIAGNOSIS — I4891 Unspecified atrial fibrillation: Secondary | ICD-10-CM | POA: Diagnosis not present

## 2021-06-21 DIAGNOSIS — E291 Testicular hypofunction: Secondary | ICD-10-CM | POA: Diagnosis not present

## 2021-06-21 DIAGNOSIS — I10 Essential (primary) hypertension: Secondary | ICD-10-CM | POA: Diagnosis not present

## 2021-06-21 DIAGNOSIS — N529 Male erectile dysfunction, unspecified: Secondary | ICD-10-CM | POA: Diagnosis not present

## 2021-06-21 DIAGNOSIS — K219 Gastro-esophageal reflux disease without esophagitis: Secondary | ICD-10-CM | POA: Diagnosis not present

## 2021-06-21 DIAGNOSIS — G8929 Other chronic pain: Secondary | ICD-10-CM | POA: Diagnosis not present

## 2021-06-21 DIAGNOSIS — N400 Enlarged prostate without lower urinary tract symptoms: Secondary | ICD-10-CM | POA: Diagnosis not present

## 2021-06-21 DIAGNOSIS — I2510 Atherosclerotic heart disease of native coronary artery without angina pectoris: Secondary | ICD-10-CM | POA: Diagnosis not present

## 2021-06-21 NOTE — Patient Instructions (Addendum)
Ricardo Nash , Thank you for taking time to come for your Medicare Wellness Visit. I appreciate your ongoing commitment to your health goals. Please review the following plan we discussed and let me know if I can assist you in the future.   These are the goals we discussed:  Goals      Follow up with Primary Care Provider     As needed        This is a list of the screening recommended for you and due dates:  Health Maintenance  Topic Date Due   Complete foot exam   06/05/2021   Tetanus Vaccine  06/21/2022*   Hemoglobin A1C  12/08/2021   Eye exam for diabetics  04/01/2022   Pneumonia Vaccine  Completed   Flu Shot  Completed   COVID-19 Vaccine  Completed   Hepatitis C Screening: USPSTF Recommendation to screen - Ages 56-79 yo.  Completed   Zoster (Shingles) Vaccine  Completed   HPV Vaccine  Aged Out   Colon Cancer Screening  Discontinued  *Topic was postponed. The date shown is not the original due date.    Advanced directives: on file  Conditions/risks identified: none new  Follow up in one year for your annual wellness visit.   Preventive Care 27 Years and Older, Male Preventive care refers to lifestyle choices and visits with your health care provider that can promote health and wellness. What does preventive care include? A yearly physical exam. This is also called an annual well check. Dental exams once or twice a year. Routine eye exams. Ask your health care provider how often you should have your eyes checked. Personal lifestyle choices, including: Daily care of your teeth and gums. Regular physical activity. Eating a healthy diet. Avoiding tobacco and drug use. Limiting alcohol use. Practicing safe sex. Taking low doses of aspirin every day. Taking vitamin and mineral supplements as recommended by your health care provider. What happens during an annual well check? The services and screenings done by your health care provider during your annual well check  will depend on your age, overall health, lifestyle risk factors, and family history of disease. Counseling  Your health care provider may ask you questions about your: Alcohol use. Tobacco use. Drug use. Emotional well-being. Home and relationship well-being. Sexual activity. Eating habits. History of falls. Memory and ability to understand (cognition). Work and work Statistician. Screening  You may have the following tests or measurements: Height, weight, and BMI. Blood pressure. Lipid and cholesterol levels. These may be checked every 5 years, or more frequently if you are over 45 years old. Skin check. Lung cancer screening. You may have this screening every year starting at age 63 if you have a 30-pack-year history of smoking and currently smoke or have quit within the past 15 years. Fecal occult blood test (FOBT) of the stool. You may have this test every year starting at age 76. Flexible sigmoidoscopy or colonoscopy. You may have a sigmoidoscopy every 5 years or a colonoscopy every 10 years starting at age 96. Prostate cancer screening. Recommendations will vary depending on your family history and other risks. Hepatitis C blood test. Hepatitis B blood test. Sexually transmitted disease (STD) testing. Diabetes screening. This is done by checking your blood sugar (glucose) after you have not eaten for a while (fasting). You may have this done every 1-3 years. Abdominal aortic aneurysm (AAA) screening. You may need this if you are a current or former smoker. Osteoporosis. You may be screened  starting at age 65 if you are at high risk. Talk with your health care provider about your test results, treatment options, and if necessary, the need for more tests. Vaccines  Your health care provider may recommend certain vaccines, such as: Influenza vaccine. This is recommended every year. Tetanus, diphtheria, and acellular pertussis (Tdap, Td) vaccine. You may need a Td booster every 10  years. Zoster vaccine. You may need this after age 24. Pneumococcal 13-valent conjugate (PCV13) vaccine. One dose is recommended after age 20. Pneumococcal polysaccharide (PPSV23) vaccine. One dose is recommended after age 49. Talk to your health care provider about which screenings and vaccines you need and how often you need them. This information is not intended to replace advice given to you by your health care provider. Make sure you discuss any questions you have with your health care provider. Document Released: 05/25/2015 Document Revised: 01/16/2016 Document Reviewed: 02/27/2015 Elsevier Interactive Patient Education  2017 [place] Prevention in the Home Falls can cause injuries. They can happen to people of all ages. There are many things you can do to make your home safe and to help prevent falls. What can I do on the outside of my home? Regularly fix the edges of walkways and driveways and fix any cracks. Remove anything that might make you trip as you walk through a door, such as a raised step or threshold. Trim any bushes or trees on the path to your home. Use bright outdoor lighting. Clear any walking paths of anything that might make someone trip, such as rocks or tools. Regularly check to see if handrails are loose or broken. Make sure that both sides of any steps have handrails. Any raised decks and porches should have guardrails on the edges. Have any leaves, snow, or ice cleared regularly. Use sand or salt on walking paths during winter. Clean up any spills in your garage right away. This includes oil or grease spills. What can I do in the bathroom? Use night lights. Install grab bars by the toilet and in the tub and shower. Do not use towel bars as grab bars. Use non-skid mats or decals in the tub or shower. If you need to sit down in the shower, use a plastic, non-slip stool. Keep the floor dry. Clean up any water that spills on the floor as soon as it  happens. Remove soap buildup in the tub or shower regularly. Attach bath mats securely with double-sided non-slip rug tape. Do not have throw rugs and other things on the floor that can make you trip. What can I do in the bedroom? Use night lights. Make sure that you have a light by your bed that is easy to reach. Do not use any sheets or blankets that are too big for your bed. They should not hang down onto the floor. Have a firm chair that has side arms. You can use this for support while you get dressed. Do not have throw rugs and other things on the floor that can make you trip. What can I do in the kitchen? Clean up any spills right away. Avoid walking on wet floors. Keep items that you use a lot in easy-to-reach places. If you need to reach something above you, use a strong step stool that has a grab bar. Keep electrical cords out of the way. Do not use floor polish or wax that makes floors slippery. If you must use wax, use non-skid floor wax. Do not have throw  rugs and other things on the floor that can make you trip. What can I do with my stairs? Do not leave any items on the stairs. Make sure that there are handrails on both sides of the stairs and use them. Fix handrails that are broken or loose. Make sure that handrails are as long as the stairways. Check any carpeting to make sure that it is firmly attached to the stairs. Fix any carpet that is loose or worn. Avoid having throw rugs at the top or bottom of the stairs. If you do have throw rugs, attach them to the floor with carpet tape. Make sure that you have a light switch at the top of the stairs and the bottom of the stairs. If you do not have them, ask someone to add them for you. What else can I do to help prevent falls? Wear shoes that: Do not have high heels. Have rubber bottoms. Are comfortable and fit you well. Are closed at the toe. Do not wear sandals. If you use a stepladder: Make sure that it is fully opened.  Do not climb a closed stepladder. Make sure that both sides of the stepladder are locked into place. Ask someone to hold it for you, if possible. Clearly mark and make sure that you can see: Any grab bars or handrails. First and last steps. Where the edge of each step is. Use tools that help you move around (mobility aids) if they are needed. These include: Canes. Walkers. Scooters. Crutches. Turn on the lights when you go into a dark area. Replace any light bulbs as soon as they burn out. Set up your furniture so you have a clear path. Avoid moving your furniture around. If any of your floors are uneven, fix them. If there are any pets around you, be aware of where they are. Review your medicines with your doctor. Some medicines can make you feel dizzy. This can increase your chance of falling. Ask your doctor what other things that you can do to help prevent falls. This information is not intended to replace advice given to you by your health care provider. Make sure you discuss any questions you have with your health care provider. Document Released: 02/22/2009 Document Revised: 10/04/2015 Document Reviewed: 06/02/2014 Elsevier Interactive Patient Education  2017 [employer].

## 2021-06-21 NOTE — Progress Notes (Signed)
Subjective:   Ricardo Nash is a 77 y.o. male who presents for Medicare Annual/Subsequent preventive examination.  Review of Systems    No ROS.  Medicare Wellness Virtual Visit.  Visual/audio telehealth visit, UTA vital signs.   See social history for additional risk factors.   Cardiac Risk Factors include: advanced age (>22mn, >>70women);diabetes mellitus;hypertension;male gender     Objective:    Today's Vitals   06/21/21 1036  Weight: 203 lb (92.1 kg)  Height: '5\' 9"'  (1.753 m)   Body mass index is 29.98 kg/m.  Advanced Directives 06/21/2021 10/15/2020 10/15/2020 10/11/2020 09/14/2020 06/20/2020 06/20/2019  Does Patient Have a Medical Advance Directive? Yes No No Yes No Yes Yes  Type of AParamedicof AWilton CenterLiving will [place]Living will - H[place]urel ParkLiving will - HBensonLiving will HCoffeeLiving will  Does patient want to make changes to medical advance directive? No - Patient declined - - - - No - Patient declined No - Patient declined  Copy of HM[place]in Chart? Yes - validated most recent copy scanned in chart (See row information) No - copy requested - Yes - validated most recent copy scanned in chart (See row information) - Yes - validated most recent copy scanned in chart (See row information) Yes - validated most recent copy scanned in chart (See row information)  Would patient like information on creating a medical advance directive? - No - Patient declined No - Patient declined - - - -    Current Medications (verified) Outpatient Encounter Medications as of 06/21/2021  Medication Sig   ACCU-CHEK GUIDE test strip USE AS DIRECTED TO TEST BLOOD GLUCOSE LEVELS TWICE DAILY   ALPRAZolam (XANAX) 0.25 MG tablet TAKE 1 TABLET BY MOUTH ONCE DAILY AS NEEDED.   amLODipine (NORVASC) 10 MG tablet TAKE 1 TABLET BY MOUTH ONCE DAILY   Ascorbic Acid (VITAMIN C) 100  MG tablet Take 100 mg by mouth in the morning.   aspirin EC 81 MG tablet Take 81 mg by mouth every evening. Swallow whole.   atorvastatin (LIPITOR) 20 MG tablet Take 1 tablet (20 mg total) by mouth daily.   Azelastine-Fluticasone (DYMISTA) 137-50 MCG/ACT SUSP Place 1 spray into the nose in the morning and at bedtime.   blood glucose meter kit and supplies Dispense based on patient and insurance preference. Use up to four times daily as directed. (FOR ICD-10 E10.9, E11.9).   carvedilol (COREG) 25 MG tablet Take 1 tablet (25 mg total) by mouth 2 (two) times daily with a meal.   hydrochlorothiazide (HYDRODIURIL) 50 MG tablet TAKE 1 TABLET BY MOUTH ONCE DAILY   levocetirizine (XYZAL) 5 MG tablet Take 1 tablet (5 mg total) by mouth every evening.   lisinopril (ZESTRIL) 40 MG tablet TAKE 1 TABLET BY MOUTH ONCE DAILY   metFORMIN (GLUCOPHAGE) 1000 MG tablet TAKE 1 TABLET BY MOUTH TWICE DAILY WITH MEALS   montelukast (SINGULAIR) 10 MG tablet Take 1 tablet (10 mg total) by mouth at bedtime.   Multiple Vitamin (MULTIVITAMIN WITH MINERALS) TABS tablet Take 1 tablet by mouth in the morning.   pantoprazole (PROTONIX) 40 MG tablet TAKE 1 TABLET BY MOUTH ONCE DAILY   pioglitazone (ACTOS) 15 MG tablet Take 1 tablet (15 mg total) by mouth daily.   sertraline (ZOLOFT) 100 MG tablet Take 1 tablet (100 mg total) by mouth daily.   tamsulosin (FLOMAX) 0.4 MG CAPS capsule Take 1 capsule (0.4 mg  total) by mouth daily with breakfast.   testosterone cypionate (DEPOTESTOSTERONE CYPIONATE) 200 MG/ML injection Inject 1 mL (200 mg total) into the muscle every 14 (fourteen) days.   traZODone (DESYREL) 50 MG tablet Take 1.5 tablets (75 mg total) by mouth at bedtime.   TRULICITY 7.90 WI/0.9BD SOPN INJECT 0.75MG SUBQ ONCE A WEEK   No facility-administered encounter medications on file as of 06/21/2021.    Allergies (verified) Penicillins   History: Past Medical History:  Diagnosis Date   C. difficile colitis    Coronary  artery disease    Depression    Diabetes mellitus ([place])    Diverticulitis    Diverticulosis    Dysrhythmia    PAF   Environmental allergies    GERD (gastroesophageal reflux disease)    Goiter    intrathoracic, s/p benign biopsy (Dr Harlow Asa)   Hypercholesterolemia    Hypertension    Osteoarthritis    cervical spine, lumbar spine   Paroxysmal atrial fibrillation (HCC)    Pneumonia    PONV (postoperative nausea and vomiting)    Sleep apnea    CPAP @ NIGHT   Stroke [facility])    TIA (transient ischemic attack)    Past Surgical History:  Procedure Laterality Date   BACK SURGERY  8/08   s/p fusion of L4-L5   capsule endoscopy     CATARACT EXTRACTION  2011   Dr. Herbert Deaner   CERVICAL DISC SURGERY  2002   COLONOSCOPY WITH PROPOFOL N/A 07/20/2017   Procedure: COLONOSCOPY WITH PROPOFOL;  Surgeon: Manya Silvas, MD;  Location: [facility]OSCOPY;  Service: Endoscopy;  Laterality: N/A;   EYE SURGERY     cataract bilateral 10/1999   eyelid reduction     Dr. Kerby Less HERNIA REPAIR  1991   Dr, Cassell Clement LAMINECTOMY/DECOMPRESSION MICRODISCECTOMY N/A 10/15/2020   Procedure: Laminectomy - Lumbar Two-Lumbar Three - Lumbar Three-Lumbar Four with sublaminar decompression;  Surgeon: Eustace Moore, MD;  Location: [place];  Service: Neurosurgery;  Laterality: N/A;  Laminectomy - Lumbar Two-Lumbar Three - Lumbar Three-Lumbar Four with sublaminar decompression   NOSE SURGERY     turbinate reduction   SEPTOPLASTY  1975   SHOULDER SURGERY  2000   rotator cuff   Family History  Problem Relation Age of Onset   Congestive Heart Failure Father    Heart disease Father        myocardial infarction   Rheumatic fever Father        valvular disease   Heart disease Mother        s/p CABG (age 9)   Kidney disease Sister    Colon cancer Neg Hx    Prostate cancer Neg Hx    Social History   Socioeconomic History   Marital status: Married    Spouse name: Not on file   Number of children: 2    Years of education: Not on file   Highest education level: Not on file  Occupational History   Occupation: retired Pharmacist, hospital  Tobacco Use   Smoking status: Former   Smokeless tobacco: Never  Scientific laboratory technician Use: Never used  Substance and Sexual Activity   Alcohol use: Yes    Alcohol/week: 0.0 standard drinks    Comment: occasional   Drug use: No   Sexual activity: Not Currently  Other Topics Concern   Not on file  Social History Narrative   Not on file   Social Determinants of Health   Financial  Resource Strain: Low Risk    Difficulty of Paying Living Expenses: Not hard at all  Food Insecurity: No Food Insecurity   Worried About Charity fundraiser in the Last Year: Never true   Ran Out of Food in the Last Year: Never true  Transportation Needs: No Transportation Needs   Lack of Transportation (Medical): No   Lack of Transportation (Non-Medical): No  Physical Activity: Sufficiently Active   Days of Exercise per Week: 5 days   Minutes of Exercise per Session: 60 min  Stress: No Stress Concern Present   Feeling of Stress : Not at all  Social Connections: Unknown   Frequency of Communication with Friends and Family: Not on file   Frequency of Social Gatherings with Friends and Family: Not on file   Attends Religious Services: Not on Electrical engineer or Organizations: Not on file   Attends Archivist Meetings: Not on file   Marital Status: Married    Tobacco Counseling Counseling given: Not Answered   Clinical Intake:  Pre-visit preparation completed: Yes        Diabetes: Yes (Followed by pcp)  How often do you need to have someone help you when you read instructions, pamphlets, or other written materials from your doctor or pharmacy?: 1 - Never  Nutrition Risk Assessment: Does the patient have any non-healing wounds?  No   Diabetes Management: Are you having any financial strains with the device, your supplies or your medication?  No .  Does the patient want to be seen by Chronic Care Management for management of their diabetes?  No  Would the patient like to be referred to a Nutritionist or for Diabetic Management?  No     Interpreter Needed?: No      Activities of Daily Living In your present state of health, do you have any difficulty performing the following activities: 06/21/2021 10/15/2020  Hearing? Y Y  Comment Hearing aids HOH; Bilateral haring aids at home.  Vision? N N  Difficulty concentrating or making decisions? N N  Walking or climbing stairs? Y Y  Comment Paces self. -  Dressing or bathing? N Y  Doing errands, shopping? N N  Preparing Food and eating ? N -  Using the Toilet? N -  In the past six months, have you accidently leaked urine? N -  Do you have problems with loss of bowel control? Y -  Comment Followed by PCP -  Managing your Medications? N -  Managing your Finances? Y -  Comment Wife manages -  Housekeeping or managing your Housekeeping? N -  Some recent data might be hidden    Patient Care Team: Einar Pheasant, MD as PCP - General (Internal Medicine)  Indicate any recent Medical Services you may have received from other than [facility] providers in the past year (date may be approximate).     Assessment:   This is a routine wellness examination for [place].  Virtual Visit via Telephone Note  I connected with  Ricardo Nash on 06/21/21 at 10:30 AM EST by telephone and verified that I am speaking with the correct person using two identifiers.  Persons participating in the virtual visit: patient/Nurse Health Advisor   I discussed the limitations, risks, security and privacy concerns of performing an evaluation and management service by telephone and the availability of in person appointments. The patient expressed understanding and agreed to proceed.  Interactive audio and video telecommunications were attempted between this  nurse and patient, however failed, due to patient  having technical difficulties OR patient did not have access to video capability.  We continued and completed visit with audio only.  Some vital signs may be absent or patient reported.   Hearing/Vision screen Hearing Screening - Comments:: Followed by [facility]  Visits as needed  Hearing aid, bilateral Vision Screening - Comments:: Followed by Dr. Sondra Barges  Wears corrective lenses  Cataract extraction, bilateral  They have regular follow up with the ophthalmologist  Dietary issues and exercise activities discussed: Current Exercise Habits: Home exercise routine, Intensity: Mild Low carb  Good water intake   Goals Addressed             This Visit's Progress    Follow up with Primary Care Provider       As needed       Depression Screen [facility] 2/9 Scores 06/21/2021 06/20/2020 06/20/2019 06/16/2018 06/08/2017 06/05/2016 09/07/2015  PHQ - 2 Score 0 0 0 0 1 0 2  PHQ- 9 Score - - - - - - 8    Fall Risk Fall Risk  06/21/2021 09/17/2020 06/20/2020 06/20/2019 06/16/2018  Falls in the past year? 0 1 1 0 0  Number falls in past yr: 0 1 0 - -  Injury with Fall? - 0 0 - -  Risk for fall due to : - History of fall(s);Impaired balance/gait Impaired balance/gait - -  Follow up Falls evaluation completed Falls evaluation completed Falls evaluation completed Falls evaluation completed -    FALL RISK PREVENTION PERTAINING TO THE HOME: Home free of loose throw rugs in walkways, pet beds, electrical cords, etc? Yes  Adequate lighting in your home to reduce risk of falls? Yes   ASSISTIVE DEVICES UTILIZED TO PREVENT FALLS: Life alert? No  Use of a cane, walker or w/c? No  Grab bars in the bathroom? Yes  Shower chair or bench in shower? Yes  Elevated toilet seat or a handicapped toilet? Yes   TIMED UP AND GO: Was the test performed? No .   Cognitive Function: Patient is alert and oriented x3.  MMSE - Mini Mental State Exam 06/05/2016  Orientation to time 5  Orientation to Place 5   Registration 3  Attention/ Calculation 5  Recall 3  Language- name 2 objects 2  Language- repeat 1  Language- follow 3 step command 3  Language- read & follow direction 1  Write a sentence 1  Copy design 1  Total score 30     6CIT Screen 06/21/2021 06/20/2019 06/16/2018 06/08/2017  What Year? 0 points 0 points 0 points 0 points  What month? 0 points 0 points 0 points 0 points  What time? 0 points 0 points 0 points 0 points  Count back from 20 - - 0 points 0 points  Months in reverse 0 points 0 points 0 points 0 points  Repeat phrase - - 0 points 0 points  Total Score - - 0 0    Immunizations Immunization History  Administered Date(s) Administered   Fluad Quad(high Dose 65+) 02/17/2020, 02/07/2021   Influenza Split 02/16/2012, 02/13/2014   Influenza, High Dose Seasonal PF 02/20/2016, 02/05/2017, 02/09/2018, 02/07/2019   Influenza,inj,Quad PF,6+ Mos 01/14/2013, 03/08/2015   PFIZER(Purple Top)SARS-COV-2 Vaccination 06/20/2019, 07/11/2019, 02/15/2020, 08/23/2020   Pfizer Covid-19 Vaccine Bivalent Booster 63yr & up 02/18/2021   Pneumococcal Conjugate-13 03/10/2013   Pneumococcal Polysaccharide-23 08/23/2014   Tdap 05/13/2011   Zoster Recombinat (Shingrix) 04/24/2017, 07/15/2017   Zoster, Live 12/08/2013  TDAP status: Due, Education has been provided regarding the importance of this vaccine. Advised may receive this vaccine at local pharmacy or Health Dept. Aware to provide a copy of the vaccination record if obtained from local pharmacy or Health Dept. Verbalized acceptance and understanding.Deferred.   Screening Tests Health Maintenance  Topic Date Due   FOOT EXAM  06/05/2021   TETANUS/TDAP  06/21/2022 (Originally 05/12/2021)   HEMOGLOBIN A1C  12/08/2021   OPHTHALMOLOGY EXAM  04/01/2022   Pneumonia Vaccine 42+ Years old  Completed   INFLUENZA VACCINE  Completed   COVID-19 Vaccine  Completed   Hepatitis C Screening  Completed   Zoster Vaccines- Shingrix  Completed   HPV  VACCINES  Aged Out   COLONOSCOPY (Pts 45-51yr Insurance coverage will need to be confirmed)  Discontinued   Health Maintenance Health Maintenance Due  Topic Date Due   FOOT EXAM  06/05/2021   Lung Cancer Screening: (Low Dose CT Chest recommended if Age 77-80years, 30 pack-year currently smoking OR have quit w/in 15years.) does not qualify.   Vision Screening: Recommended annual ophthalmology exams for early detection of glaucoma and other disorders of the eye.  Dental Screening: Recommended annual dental exams for proper oral hygiene  Community Resource Referral / Chronic Care Management: CRR required this visit?  No   CCM required this visit?  No      Plan:   Awaiting call for referral to GI. Notes discussed with PCP at recent.    I have personally reviewed and noted the following in the patients chart:   Medical and social history Use of alcohol, tobacco or illicit drugs  Current medications and supplements including opioid prescriptions. Patient is not currently taking opioid prescriptions. Functional ability and status Nutritional status Physical activity Advanced directives List of other physicians Hospitalizations, surgeries, and ER visits in previous 12 months Vitals Screenings to include cognitive, depression, and falls Referrals and appointments  In addition, I have reviewed and discussed with patient certain preventive protocols, quality metrics, and best practice recommendations. A written personalized care plan for preventive services as well as general preventive health recommendations were provided to patient via mychart.     OVarney Biles LPN   [id number]

## 2021-06-22 ENCOUNTER — Encounter: Payer: Self-pay | Admitting: Internal Medicine

## 2021-06-22 MED ORDER — TRULICITY 0.75 MG/0.5ML ~~LOC~~ SOAJ: SUBCUTANEOUS | 2 refills | Status: DC

## 2021-06-22 NOTE — Assessment & Plan Note (Signed)
No chest pain or sob.  Riding bike.  Continue coreg, lisinopril and statin.  Follow.

## 2021-06-22 NOTE — Assessment & Plan Note (Signed)
Continues on lipitor.  

## 2021-06-22 NOTE — Assessment & Plan Note (Signed)
Continues on hctz, coreg, lisinopril and amlodipine. On coreg to 25mg  bid.  Pressure better.  Follow pressures.  Follow metabolic panel.

## 2021-06-22 NOTE — Assessment & Plan Note (Signed)
No upper symptoms reported.  Continue protonix.  

## 2021-06-22 NOTE — Assessment & Plan Note (Addendum)
Persistent bowel changes.  Bowel urgency.  Alternating diarrhea and constipation.  Discussed taking benefiber daily. History of polyps.  Last colonoscopy 2019.  Request given persistent bowel changes, referral to GI.

## 2021-06-22 NOTE — Assessment & Plan Note (Signed)
Knee pain as outlined.  Walking aggravates.  Still exercising.  Plans f/u with [facility].

## 2021-06-22 NOTE — Assessment & Plan Note (Signed)
Continue cpap.  

## 2021-06-22 NOTE — Assessment & Plan Note (Signed)
Continue zoloft.  Overall appears to be doing better.  Follow.

## 2021-06-22 NOTE — Assessment & Plan Note (Signed)
Continues on trulicity and metformin.  Follow sugars.  Follow metabolic panel and G2R.

## 2021-06-22 NOTE — Assessment & Plan Note (Signed)
On zoloft.  Follow.  Doing better.   

## 2021-06-22 NOTE — Assessment & Plan Note (Signed)
Continue lipitor.  Low cholesterol diet and exercise.  Follow lipid panel and liver function tests.   

## 2021-06-29 ENCOUNTER — Other Ambulatory Visit: Payer: Self-pay

## 2021-06-29 ENCOUNTER — Inpatient Hospital Stay
Admission: EM | Admit: 2021-06-29 | Discharge: 2021-07-08 | DRG: 193 | Disposition: A | Discharge status: Home Health | Payer: Medicare PPO | Attending: Internal Medicine | Admitting: Internal Medicine

## 2021-06-29 ENCOUNTER — Emergency Department: Payer: Medicare PPO

## 2021-06-29 DIAGNOSIS — Z9842 Cataract extraction status, left eye: Secondary | ICD-10-CM

## 2021-06-29 DIAGNOSIS — Z20822 Contact with and (suspected) exposure to covid-19: Secondary | ICD-10-CM | POA: Diagnosis present

## 2021-06-29 DIAGNOSIS — R059 Cough, unspecified: Secondary | ICD-10-CM | POA: Diagnosis not present

## 2021-06-29 DIAGNOSIS — J9601 Acute respiratory failure with hypoxia: Secondary | ICD-10-CM | POA: Diagnosis present

## 2021-06-29 DIAGNOSIS — Z87891 Personal history of nicotine dependence: Secondary | ICD-10-CM

## 2021-06-29 DIAGNOSIS — I5023 Acute on chronic systolic (congestive) heart failure: Secondary | ICD-10-CM | POA: Diagnosis present

## 2021-06-29 DIAGNOSIS — E042 Nontoxic multinodular goiter: Secondary | ICD-10-CM | POA: Diagnosis not present

## 2021-06-29 DIAGNOSIS — F419 Anxiety disorder, unspecified: Secondary | ICD-10-CM | POA: Diagnosis present

## 2021-06-29 DIAGNOSIS — Z981 Arthrodesis status: Secondary | ICD-10-CM | POA: Diagnosis not present

## 2021-06-29 DIAGNOSIS — Z794 Long term (current) use of insulin: Secondary | ICD-10-CM

## 2021-06-29 DIAGNOSIS — I110 Hypertensive heart disease with heart failure: Secondary | ICD-10-CM | POA: Diagnosis present

## 2021-06-29 DIAGNOSIS — R609 Edema, unspecified: Secondary | ICD-10-CM

## 2021-06-29 DIAGNOSIS — E7800 Pure hypercholesterolemia, unspecified: Secondary | ICD-10-CM | POA: Diagnosis present

## 2021-06-29 DIAGNOSIS — N179 Acute kidney failure, unspecified: Secondary | ICD-10-CM | POA: Diagnosis not present

## 2021-06-29 DIAGNOSIS — Z8249 Family history of ischemic heart disease and other diseases of the circulatory system: Secondary | ICD-10-CM

## 2021-06-29 DIAGNOSIS — J90 Pleural effusion, not elsewhere classified: Secondary | ICD-10-CM | POA: Diagnosis not present

## 2021-06-29 DIAGNOSIS — R0602 Shortness of breath: Secondary | ICD-10-CM | POA: Diagnosis not present

## 2021-06-29 DIAGNOSIS — Z7982 Long term (current) use of aspirin: Secondary | ICD-10-CM

## 2021-06-29 DIAGNOSIS — Z7984 Long term (current) use of oral hypoglycemic drugs: Secondary | ICD-10-CM

## 2021-06-29 DIAGNOSIS — J309 Allergic rhinitis, unspecified: Secondary | ICD-10-CM | POA: Diagnosis present

## 2021-06-29 DIAGNOSIS — J40 Bronchitis, not specified as acute or chronic: Secondary | ICD-10-CM | POA: Diagnosis present

## 2021-06-29 DIAGNOSIS — J180 Bronchopneumonia, unspecified organism: Principal | ICD-10-CM | POA: Diagnosis present

## 2021-06-29 DIAGNOSIS — F32A Depression, unspecified: Secondary | ICD-10-CM | POA: Diagnosis present

## 2021-06-29 DIAGNOSIS — I447 Left bundle-branch block, unspecified: Secondary | ICD-10-CM | POA: Diagnosis present

## 2021-06-29 DIAGNOSIS — K219 Gastro-esophageal reflux disease without esophagitis: Secondary | ICD-10-CM | POA: Diagnosis present

## 2021-06-29 DIAGNOSIS — Z8673 Personal history of transient ischemic attack (TIA), and cerebral infarction without residual deficits: Secondary | ICD-10-CM

## 2021-06-29 DIAGNOSIS — R0600 Dyspnea, unspecified: Secondary | ICD-10-CM | POA: Diagnosis not present

## 2021-06-29 DIAGNOSIS — E669 Obesity, unspecified: Secondary | ICD-10-CM | POA: Diagnosis present

## 2021-06-29 DIAGNOSIS — I10 Essential (primary) hypertension: Secondary | ICD-10-CM | POA: Diagnosis present

## 2021-06-29 DIAGNOSIS — J188 Other pneumonia, unspecified organism: Secondary | ICD-10-CM | POA: Diagnosis not present

## 2021-06-29 DIAGNOSIS — G4733 Obstructive sleep apnea (adult) (pediatric): Secondary | ICD-10-CM | POA: Diagnosis present

## 2021-06-29 DIAGNOSIS — Z880 Allergy status to penicillin: Secondary | ICD-10-CM

## 2021-06-29 DIAGNOSIS — E119 Type 2 diabetes mellitus without complications: Secondary | ICD-10-CM | POA: Diagnosis present

## 2021-06-29 DIAGNOSIS — J189 Pneumonia, unspecified organism: Principal | ICD-10-CM

## 2021-06-29 DIAGNOSIS — Z9889 Other specified postprocedural states: Secondary | ICD-10-CM

## 2021-06-29 DIAGNOSIS — I420 Dilated cardiomyopathy: Secondary | ICD-10-CM | POA: Diagnosis present

## 2021-06-29 DIAGNOSIS — R918 Other nonspecific abnormal finding of lung field: Secondary | ICD-10-CM | POA: Diagnosis not present

## 2021-06-29 DIAGNOSIS — I480 Paroxysmal atrial fibrillation: Secondary | ICD-10-CM | POA: Diagnosis present

## 2021-06-29 DIAGNOSIS — Z9841 Cataract extraction status, right eye: Secondary | ICD-10-CM

## 2021-06-29 DIAGNOSIS — I2510 Atherosclerotic heart disease of native coronary artery without angina pectoris: Secondary | ICD-10-CM | POA: Diagnosis present

## 2021-06-29 DIAGNOSIS — Z6830 Body mass index (BMI) 30.0-30.9, adult: Secondary | ICD-10-CM

## 2021-06-29 DIAGNOSIS — Z79899 Other long term (current) drug therapy: Secondary | ICD-10-CM

## 2021-06-29 DIAGNOSIS — R509 Fever, unspecified: Secondary | ICD-10-CM | POA: Diagnosis not present

## 2021-06-29 DIAGNOSIS — Z8701 Personal history of pneumonia (recurrent): Secondary | ICD-10-CM

## 2021-06-29 DIAGNOSIS — I517 Cardiomegaly: Secondary | ICD-10-CM | POA: Diagnosis not present

## 2021-06-29 DIAGNOSIS — R0603 Acute respiratory distress: Secondary | ICD-10-CM | POA: Diagnosis not present

## 2021-06-29 LAB — COMPREHENSIVE METABOLIC PANEL
ALT: 25 U/L (ref 0–44)
AST: 28 U/L (ref 15–41)
Albumin: 3.7 g/dL (ref 3.5–5.0)
Alkaline Phosphatase: 48 U/L (ref 38–126)
Anion gap: 10 (ref 5–15)
BUN: 20 mg/dL (ref 8–23)
CO2: 24 mmol/L (ref 22–32)
Calcium: 8.6 mg/dL — ABNORMAL LOW (ref 8.9–10.3)
Chloride: 101 mmol/L (ref 98–111)
Creatinine, Ser: 0.96 mg/dL (ref 0.61–1.24)
GFR, Estimated: >60 mL/min (ref >60)
Glucose, Bld: 160 mg/dL — ABNORMAL HIGH (ref 70–99)
Potassium: 4.3 mmol/L (ref 3.5–5.1)
Sodium: 135 mmol/L (ref 135–145)
Total Bilirubin: 1.2 mg/dL (ref 0.3–1.2)
Total Protein: 7.1 g/dL (ref 6.5–8.1)

## 2021-06-29 LAB — TROPONIN I (HIGH SENSITIVITY): Troponin I (High Sensitivity): 27 ng/L — ABNORMAL HIGH (ref <18)

## 2021-06-29 LAB — CBC WITH DIFFERENTIAL/PLATELET
Abs Immature Granulocytes: 0.03 10*3/uL (ref 0.00–0.07)
Basophils Absolute: 0 10*3/uL (ref 0.0–0.1)
Basophils Relative: 0 %
Eosinophils Absolute: 0 10*3/uL (ref 0.0–0.5)
Eosinophils Relative: 0 %
HCT: 38.7 % — ABNORMAL LOW (ref 39.0–52.0)
Hemoglobin: 12.1 g/dL — ABNORMAL LOW (ref 13.0–17.0)
Immature Granulocytes: 0 %
Lymphocytes Relative: 12 %
Lymphs Abs: 1.1 10*3/uL (ref 0.7–4.0)
MCH: 28.5 pg (ref 26.0–34.0)
MCHC: 31.3 g/dL (ref 30.0–36.0)
MCV: 91.3 fL (ref 80.0–100.0)
Monocytes Absolute: 0.5 10*3/uL (ref 0.1–1.0)
Monocytes Relative: 6 %
Neutro Abs: 7.6 10*3/uL (ref 1.7–7.7)
Neutrophils Relative %: 82 %
Platelets: 202 10*3/uL (ref 150–400)
RBC: 4.24 MIL/uL (ref 4.22–5.81)
RDW: 14.5 % (ref 11.5–15.5)
WBC: 9.3 10*3/uL (ref 4.0–10.5)
nRBC: 0 % (ref 0.0–0.2)

## 2021-06-29 LAB — RESP PANEL BY RT-PCR (FLU A&B, COVID) ARPGX2
Influenza A by PCR: NEGATIVE
Influenza B by PCR: NEGATIVE
SARS Coronavirus 2 by RT PCR: NEGATIVE

## 2021-06-29 MED ORDER — SODIUM CHLORIDE 0.9 % IV SOLN
2.0000 g | Freq: Once | INTRAVENOUS | Status: AC
Start: 2021-06-29 — End: 2021-06-30
  Administered 2021-06-29: 2 g via INTRAVENOUS
  Filled 2021-06-29: qty 20

## 2021-06-29 MED ORDER — SODIUM CHLORIDE 0.9 % IV SOLN
500.0000 mg | Freq: Once | INTRAVENOUS | Status: AC
  Administered 2021-06-30: 500 mg via INTRAVENOUS
  Filled 2021-06-29: qty 5

## 2021-06-29 NOTE — ED Triage Notes (Signed)
Pt has been on a cruise and returned home this morning. Pt began having congestion yesterday. Today pt has began coughing, body aches and shortness of breath.

## 2021-06-30 ENCOUNTER — Encounter: Payer: Self-pay | Admitting: Internal Medicine

## 2021-06-30 DIAGNOSIS — E669 Obesity, unspecified: Secondary | ICD-10-CM | POA: Diagnosis present

## 2021-06-30 DIAGNOSIS — K219 Gastro-esophageal reflux disease without esophagitis: Secondary | ICD-10-CM | POA: Diagnosis present

## 2021-06-30 DIAGNOSIS — J180 Bronchopneumonia, unspecified organism: Secondary | ICD-10-CM | POA: Diagnosis present

## 2021-06-30 DIAGNOSIS — Z9842 Cataract extraction status, left eye: Secondary | ICD-10-CM | POA: Diagnosis not present

## 2021-06-30 DIAGNOSIS — Z9841 Cataract extraction status, right eye: Secondary | ICD-10-CM | POA: Diagnosis not present

## 2021-06-30 DIAGNOSIS — I2510 Atherosclerotic heart disease of native coronary artery without angina pectoris: Secondary | ICD-10-CM | POA: Diagnosis present

## 2021-06-30 DIAGNOSIS — F32A Depression, unspecified: Secondary | ICD-10-CM | POA: Diagnosis present

## 2021-06-30 DIAGNOSIS — J9601 Acute respiratory failure with hypoxia: Secondary | ICD-10-CM

## 2021-06-30 DIAGNOSIS — G4733 Obstructive sleep apnea (adult) (pediatric): Secondary | ICD-10-CM | POA: Diagnosis present

## 2021-06-30 DIAGNOSIS — Z981 Arthrodesis status: Secondary | ICD-10-CM | POA: Diagnosis not present

## 2021-06-30 DIAGNOSIS — Z8673 Personal history of transient ischemic attack (TIA), and cerebral infarction without residual deficits: Secondary | ICD-10-CM | POA: Diagnosis not present

## 2021-06-30 DIAGNOSIS — N179 Acute kidney failure, unspecified: Secondary | ICD-10-CM | POA: Diagnosis not present

## 2021-06-30 DIAGNOSIS — I447 Left bundle-branch block, unspecified: Secondary | ICD-10-CM | POA: Diagnosis present

## 2021-06-30 DIAGNOSIS — E119 Type 2 diabetes mellitus without complications: Secondary | ICD-10-CM | POA: Diagnosis present

## 2021-06-30 DIAGNOSIS — F419 Anxiety disorder, unspecified: Secondary | ICD-10-CM | POA: Diagnosis present

## 2021-06-30 DIAGNOSIS — I480 Paroxysmal atrial fibrillation: Secondary | ICD-10-CM | POA: Diagnosis present

## 2021-06-30 DIAGNOSIS — J309 Allergic rhinitis, unspecified: Secondary | ICD-10-CM | POA: Diagnosis present

## 2021-06-30 DIAGNOSIS — Z20822 Contact with and (suspected) exposure to covid-19: Secondary | ICD-10-CM | POA: Diagnosis present

## 2021-06-30 DIAGNOSIS — J189 Pneumonia, unspecified organism: Secondary | ICD-10-CM

## 2021-06-30 DIAGNOSIS — I110 Hypertensive heart disease with heart failure: Secondary | ICD-10-CM | POA: Diagnosis present

## 2021-06-30 DIAGNOSIS — E7800 Pure hypercholesterolemia, unspecified: Secondary | ICD-10-CM | POA: Diagnosis present

## 2021-06-30 DIAGNOSIS — I420 Dilated cardiomyopathy: Secondary | ICD-10-CM | POA: Diagnosis present

## 2021-06-30 DIAGNOSIS — I5023 Acute on chronic systolic (congestive) heart failure: Secondary | ICD-10-CM | POA: Diagnosis present

## 2021-06-30 DIAGNOSIS — Z79899 Other long term (current) drug therapy: Secondary | ICD-10-CM | POA: Diagnosis not present

## 2021-06-30 DIAGNOSIS — J40 Bronchitis, not specified as acute or chronic: Secondary | ICD-10-CM | POA: Diagnosis present

## 2021-06-30 LAB — TROPONIN I (HIGH SENSITIVITY): Troponin I (High Sensitivity): 30 ng/L — ABNORMAL HIGH (ref <18)

## 2021-06-30 LAB — GLUCOSE, CAPILLARY
Glucose-Capillary: 156 mg/dL — ABNORMAL HIGH (ref 70–99)
Glucose-Capillary: 149 mg/dL — ABNORMAL HIGH (ref 70–99)
Glucose-Capillary: 171 mg/dL — ABNORMAL HIGH (ref 70–99)
Glucose-Capillary: 177 mg/dL — ABNORMAL HIGH (ref 70–99)
Glucose-Capillary: 131 mg/dL — ABNORMAL HIGH (ref 70–99)

## 2021-06-30 LAB — URINALYSIS, ROUTINE W REFLEX MICROSCOPIC
Bilirubin Urine: NEGATIVE
Glucose, UA: NEGATIVE mg/dL
Hgb urine dipstick: NEGATIVE
Ketones, ur: NEGATIVE mg/dL
Leukocytes,Ua: NEGATIVE
Nitrite: NEGATIVE
Protein, ur: NEGATIVE mg/dL
Specific Gravity, Urine: 1.015 (ref 1.005–1.030)
pH: 5 (ref 5.0–8.0)

## 2021-06-30 LAB — PROCALCITONIN: Procalcitonin: <0.1 ng/mL

## 2021-06-30 LAB — EXPECTORATED SPUTUM ASSESSMENT W GRAM STAIN, RFLX TO RESP C

## 2021-06-30 LAB — STREP PNEUMONIAE URINARY ANTIGEN: Strep Pneumo Urinary Antigen: NEGATIVE

## 2021-06-30 LAB — LACTIC ACID, PLASMA: Lactic Acid, Venous: 1.6 mmol/L (ref 0.5–1.9)

## 2021-06-30 MED ORDER — SODIUM CHLORIDE 0.9 % IV SOLN
2.0000 g | INTRAVENOUS | Status: DC
  Administered 2021-07-01: 2 g via INTRAVENOUS
  Filled 2021-06-30 (×2): qty 20

## 2021-06-30 MED ORDER — LISINOPRIL 20 MG PO TABS
40.0000 mg | ORAL_TABLET | Freq: Every day | ORAL | Status: DC
  Administered 2021-06-30 – 2021-07-02 (×3): 40 mg via ORAL
  Filled 2021-06-30 (×3): qty 2

## 2021-06-30 MED ORDER — PIOGLITAZONE HCL 15 MG PO TABS
15.0000 mg | ORAL_TABLET | Freq: Every day | ORAL | Status: DC
Start: 2021-07-01 — End: 2021-07-08
  Administered 2021-07-01 – 2021-07-08 (×8): 15 mg via ORAL
  Filled 2021-06-30 (×8): qty 1

## 2021-06-30 MED ORDER — ONDANSETRON HCL 4 MG/2ML IJ SOLN
4.0000 mg | Freq: Four times a day (QID) | INTRAMUSCULAR | Status: DC | PRN
  Administered 2021-06-30: 4 mg via INTRAVENOUS
  Filled 2021-06-30: qty 2

## 2021-06-30 MED ORDER — ACETAMINOPHEN 650 MG RE SUPP: 650.0000 mg | Freq: Four times a day (QID) | RECTAL | Status: DC | PRN

## 2021-06-30 MED ORDER — DM-GUAIFENESIN ER 30-600 MG PO TB12
1.0000 | ORAL_TABLET | Freq: Two times a day (BID) | ORAL | Status: AC | PRN
  Administered 2021-06-30 – 2021-07-03 (×4): 1 via ORAL
  Filled 2021-06-30: qty 2
  Filled 2021-06-30 (×4): qty 1

## 2021-06-30 MED ORDER — ALPRAZOLAM 0.25 MG PO TABS
0.2500 mg | ORAL_TABLET | Freq: Every day | ORAL | Status: DC | PRN
  Administered 2021-06-30: 0.25 mg via ORAL
  Filled 2021-06-30: qty 1

## 2021-06-30 MED ORDER — HYDROCHLOROTHIAZIDE 25 MG PO TABS
50.0000 mg | ORAL_TABLET | Freq: Every day | ORAL | Status: DC
  Administered 2021-06-30 – 2021-07-02 (×3): 50 mg via ORAL
  Filled 2021-06-30 (×3): qty 2

## 2021-06-30 MED ORDER — TAMSULOSIN HCL 0.4 MG PO CAPS
0.4000 mg | ORAL_CAPSULE | Freq: Every day | ORAL | Status: DC
  Administered 2021-06-30 – 2021-07-08 (×9): 0.4 mg via ORAL
  Filled 2021-06-30 (×9): qty 1

## 2021-06-30 MED ORDER — ONDANSETRON HCL 4 MG PO TABS: 4.0000 mg | ORAL_TABLET | Freq: Four times a day (QID) | ORAL | Status: DC | PRN

## 2021-06-30 MED ORDER — MONTELUKAST SODIUM 10 MG PO TABS
10.0000 mg | ORAL_TABLET | Freq: Every day | ORAL | Status: DC
  Administered 2021-06-30 – 2021-07-02 (×3): 10 mg via ORAL
  Filled 2021-06-30 (×3): qty 1

## 2021-06-30 MED ORDER — ACETAMINOPHEN 500 MG PO TABS
1000.0000 mg | ORAL_TABLET | Freq: Once | ORAL | Status: AC
  Administered 2021-06-30: 1000 mg via ORAL
  Filled 2021-06-30: qty 2

## 2021-06-30 MED ORDER — CARVEDILOL 25 MG PO TABS
25.0000 mg | ORAL_TABLET | Freq: Two times a day (BID) | ORAL | Status: DC
Start: 2021-06-30 — End: 2021-07-08
  Administered 2021-06-30 – 2021-07-08 (×17): 25 mg via ORAL
  Filled 2021-06-30 (×17): qty 1

## 2021-06-30 MED ORDER — PANTOPRAZOLE SODIUM 40 MG PO TBEC
40.0000 mg | DELAYED_RELEASE_TABLET | Freq: Every day | ORAL | Status: DC
  Administered 2021-06-30 – 2021-07-08 (×9): 40 mg via ORAL
  Filled 2021-06-30 (×9): qty 1

## 2021-06-30 MED ORDER — ORAL CARE MOUTH RINSE
15.0000 mL | Freq: Two times a day (BID) | OROMUCOSAL | Status: DC
  Administered 2021-06-30 – 2021-07-08 (×15): 15 mL via OROMUCOSAL

## 2021-06-30 MED ORDER — SERTRALINE HCL 50 MG PO TABS
100.0000 mg | ORAL_TABLET | Freq: Every day | ORAL | Status: DC
  Administered 2021-06-30 – 2021-07-08 (×9): 100 mg via ORAL
  Filled 2021-06-30 (×9): qty 2

## 2021-06-30 MED ORDER — AZELASTINE-FLUTICASONE 137-50 MCG/ACT NA SUSP: 1.0000 | Freq: Every day | NASAL | Status: DC

## 2021-06-30 MED ORDER — TRAZODONE HCL 50 MG PO TABS
75.0000 mg | ORAL_TABLET | Freq: Every day | ORAL | Status: DC
  Administered 2021-06-30 – 2021-07-07 (×8): 75 mg via ORAL
  Filled 2021-06-30 (×8): qty 2

## 2021-06-30 MED ORDER — INSULIN ASPART 100 UNIT/ML IJ SOLN
0.0000 [IU] | Freq: Three times a day (TID) | INTRAMUSCULAR | Status: DC
  Administered 2021-06-30: 09:00:00 2 [IU] via SUBCUTANEOUS
  Administered 2021-06-30 (×2): 3 [IU] via SUBCUTANEOUS
  Administered 2021-07-01: 12:00:00 2 [IU] via SUBCUTANEOUS
  Administered 2021-07-01: 3 [IU] via SUBCUTANEOUS
  Administered 2021-07-01 – 2021-07-02 (×2): 2 [IU] via SUBCUTANEOUS
  Administered 2021-07-02: 3 [IU] via SUBCUTANEOUS
  Administered 2021-07-02 – 2021-07-05 (×7): 2 [IU] via SUBCUTANEOUS
  Administered 2021-07-05: 3 [IU] via SUBCUTANEOUS
  Administered 2021-07-05: 8 [IU] via SUBCUTANEOUS
  Administered 2021-07-06 (×3): 3 [IU] via SUBCUTANEOUS
  Administered 2021-07-07: 5 [IU] via SUBCUTANEOUS
  Administered 2021-07-08: 2 [IU] via SUBCUTANEOUS
  Filled 2021-06-30 (×21): qty 1

## 2021-06-30 MED ORDER — IPRATROPIUM-ALBUTEROL 0.5-2.5 (3) MG/3ML IN SOLN
3.0000 mL | Freq: Four times a day (QID) | RESPIRATORY_TRACT | Status: DC
  Administered 2021-06-30 – 2021-07-01 (×3): 3 mL via RESPIRATORY_TRACT
  Filled 2021-06-30 (×3): qty 3

## 2021-06-30 MED ORDER — LORATADINE 10 MG PO TABS
10.0000 mg | ORAL_TABLET | Freq: Every day | ORAL | Status: DC
Start: 2021-06-30 — End: 2021-07-08
  Administered 2021-06-30 – 2021-07-08 (×9): 10 mg via ORAL
  Filled 2021-06-30 (×9): qty 1

## 2021-06-30 MED ORDER — ALPRAZOLAM 0.5 MG PO TABS
0.5000 mg | ORAL_TABLET | Freq: Three times a day (TID) | ORAL | Status: DC | PRN
  Administered 2021-07-01 – 2021-07-02 (×3): 0.5 mg via ORAL
  Filled 2021-06-30 (×3): qty 1

## 2021-06-30 MED ORDER — INSULIN ASPART 100 UNIT/ML IJ SOLN: 0.0000 [IU] | Freq: Every day | INTRAMUSCULAR | Status: DC

## 2021-06-30 MED ORDER — METFORMIN HCL 500 MG PO TABS
1000.0000 mg | ORAL_TABLET | Freq: Two times a day (BID) | ORAL | Status: DC
  Administered 2021-06-30 – 2021-07-08 (×16): 1000 mg via ORAL
  Filled 2021-06-30 (×16): qty 2

## 2021-06-30 MED ORDER — AMLODIPINE BESYLATE 10 MG PO TABS
10.0000 mg | ORAL_TABLET | Freq: Every day | ORAL | Status: DC
  Administered 2021-06-30 – 2021-07-02 (×3): 10 mg via ORAL
  Filled 2021-06-30 (×3): qty 1

## 2021-06-30 MED ORDER — FLUTICASONE PROPIONATE 50 MCG/ACT NA SUSP
2.0000 | Freq: Every day | NASAL | Status: DC
  Administered 2021-07-01 – 2021-07-08 (×8): 2 via NASAL
  Filled 2021-06-30 (×2): qty 16

## 2021-06-30 MED ORDER — AZITHROMYCIN 250 MG PO TABS
500.0000 mg | ORAL_TABLET | Freq: Every day | ORAL | Status: AC
  Administered 2021-06-30 – 2021-07-03 (×4): 500 mg via ORAL
  Filled 2021-06-30 (×3): qty 2
  Filled 2021-06-30: qty 1

## 2021-06-30 MED ORDER — ENOXAPARIN SODIUM 40 MG/0.4ML IJ SOSY
40.0000 mg | PREFILLED_SYRINGE | INTRAMUSCULAR | Status: DC
  Administered 2021-06-30 – 2021-07-08 (×9): 40 mg via SUBCUTANEOUS
  Filled 2021-06-30 (×9): qty 0.4

## 2021-06-30 MED ORDER — ACETAMINOPHEN 325 MG PO TABS
650.0000 mg | ORAL_TABLET | Freq: Four times a day (QID) | ORAL | Status: DC | PRN
  Administered 2021-06-30 – 2021-07-06 (×10): 650 mg via ORAL
  Filled 2021-06-30 (×10): qty 2

## 2021-06-30 MED ORDER — SODIUM CHLORIDE 0.9 % IV SOLN
500.0000 mg | INTRAVENOUS | Status: DC
  Filled 2021-06-30: qty 5

## 2021-06-30 MED ORDER — ASPIRIN EC 81 MG PO TBEC
81.0000 mg | DELAYED_RELEASE_TABLET | Freq: Every evening | ORAL | Status: DC
  Administered 2021-06-30 – 2021-07-07 (×8): 81 mg via ORAL
  Filled 2021-06-30 (×8): qty 1

## 2021-06-30 MED ORDER — LEVOCETIRIZINE DIHYDROCHLORIDE 5 MG PO TABS: 5.0000 mg | ORAL_TABLET | Freq: Every evening | ORAL | Status: DC

## 2021-06-30 MED ORDER — ATORVASTATIN CALCIUM 20 MG PO TABS
20.0000 mg | ORAL_TABLET | Freq: Every day | ORAL | Status: DC
Start: 2021-06-30 — End: 2021-07-08
  Administered 2021-06-30 – 2021-07-08 (×9): 20 mg via ORAL
  Filled 2021-06-30 (×9): qty 1

## 2021-06-30 NOTE — Assessment & Plan Note (Addendum)
Blood pressure within goal. -Continue carvedilol, amlodipine, lisinopril and hydrochlorothiazide

## 2021-06-30 NOTE — ED Notes (Signed)
Patient was placed on 4lpm oxygen via [place] for sustained sats of 87%. Patient states he normally uses a CPAP at night without supplemental oxygen. Patients sats after application now 16%. Patient verbalizes no needs or complaints at this time.

## 2021-06-30 NOTE — ED Notes (Signed)
Valentino Hue, RN was messaged and awaiting return message.

## 2021-06-30 NOTE — ED Notes (Signed)
Notified Juliann Pulse, RN inpatient floor that the bed had been assigned but a nurse had not. She stated that she had assigned one but it was not showing on my end or I would not have been calling and instead would have just messaged that nurse. I am waiting for update.

## 2021-06-30 NOTE — Progress Notes (Signed)
PHARMACIST - PHYSICIAN COMMUNICATION  CONCERNING: Antibiotic IV to Oral Route Change Policy  RECOMMENDATION: This patient is receiving azithromycin by the intravenous route.  Based on criteria approved by the Pharmacy and Therapeutics Committee, the antibiotic(s) is/are being converted to the equivalent oral dose form(s).   DESCRIPTION: These criteria include: Patient being treated for a respiratory tract infection, urinary tract infection, cellulitis or clostridium difficile associated diarrhea if on metronidazole The patient is not neutropenic and does not exhibit a GI malabsorption state The patient is eating (either orally or via tube) and/or has been taking other orally administered medications for a least 24 hours The patient is improving clinically and has a Tmax < 100.5  If you have questions about this conversion, please contact the [place]  06/30/21

## 2021-06-30 NOTE — Assessment & Plan Note (Addendum)
CBG mildly elevated -Sliding scale insulin coverage -Add Semglee 10 units at bedtime

## 2021-06-30 NOTE — Assessment & Plan Note (Addendum)
Mildly elevated procalcitonin.  Sputum cultures pending.  MRSA swab negative.  Respiratory viral panel negative.  Antibiotics were broadened to cefepime and Zithromax due to worsening hypoxia. -Continue cefepime and Zithromax -Supplemental oxygen to keep the saturation above 90% -Continue with supportive care

## 2021-06-30 NOTE — ED Provider Notes (Signed)
[facility] Provider Note    Event Date/Time   First MD Initiated Contact with Patient 06/29/21 2314     (approximate)   History   Nasal Congestion   HPI  Ricardo Nash is a 76 y.o. male with history of paroxysmal atrial fibrillation, hypertension, diabetes, hyperlipidemia, obesity, coronary artery disease who presents to the emergency department with his wife for concerns for body aches, chills, congestion, cough for the past day.  States he has been coughing so much he is having chest pain and abdominal pain from this.  No vomiting or diarrhea.  States he just came back from a cruise that went to the [place] yesterday.  They drove to [place].  Has had pneumonia before and states this feels similar.  States he is feeling short of breath.   History provided by patient and wife.    Past Medical History:  Diagnosis Date   C. difficile colitis    Coronary artery disease    Depression    Diabetes mellitus ([place])    Diverticulitis    Diverticulosis    Dysrhythmia    PAF   Environmental allergies    GERD (gastroesophageal reflux disease)    Goiter    intrathoracic, s/p benign biopsy (Dr Harlow Asa)   Hypercholesterolemia    Hypertension    Osteoarthritis    cervical spine, lumbar spine   Paroxysmal atrial fibrillation (HCC)    Pneumonia    PONV (postoperative nausea and vomiting)    Sleep apnea    CPAP @ NIGHT   Stroke [facility])    TIA (transient ischemic attack)     Past Surgical History:  Procedure Laterality Date   BACK SURGERY  8/08   s/p fusion of L4-L5   capsule endoscopy     CATARACT EXTRACTION  2011   Dr. Herbert Deaner   CERVICAL DISC SURGERY  2002   COLONOSCOPY WITH PROPOFOL N/A 07/20/2017   Procedure: COLONOSCOPY WITH PROPOFOL;  Surgeon: Manya Silvas, MD;  Location: [facility] ENDOSCOPY;  Service: Endoscopy;  Laterality: N/A;   EYE SURGERY     cataract bilateral 10/1999   eyelid reduction     Dr. Kerby Less HERNIA REPAIR  1991    Dr, Cassell Clement LAMINECTOMY/DECOMPRESSION MICRODISCECTOMY N/A 10/15/2020   Procedure: Laminectomy - Lumbar Two-Lumbar Three - Lumbar Three-Lumbar Four with sublaminar decompression;  Surgeon: Eustace Moore, MD;  Location: [place];  Service: Neurosurgery;  Laterality: N/A;  Laminectomy - Lumbar Two-Lumbar Three - Lumbar Three-Lumbar Four with sublaminar decompression   NOSE SURGERY     turbinate reduction   SEPTOPLASTY  1975   SHOULDER SURGERY  2000   rotator cuff    MEDICATIONS:  Prior to Admission medications   Medication Sig Start Date End Date Taking? Authorizing Provider  ACCU-CHEK GUIDE test strip USE AS DIRECTED TO TEST BLOOD GLUCOSE LEVELS TWICE DAILY 11/19/20   Einar Pheasant, MD  ALPRAZolam Duanne Moron) 0.25 MG tablet TAKE 1 TABLET BY MOUTH ONCE DAILY AS NEEDED. 04/17/21   Einar Pheasant, MD  amLODipine (NORVASC) 10 MG tablet TAKE 1 TABLET BY MOUTH ONCE DAILY 06/06/21   Einar Pheasant, MD  Ascorbic Acid (VITAMIN C) 100 MG tablet Take 100 mg by mouth in the morning.    [provider]  aspirin EC 81 MG tablet Take 81 mg by mouth every evening. Swallow whole.    [provider]  atorvastatin (LIPITOR) 20 MG tablet Take 1 tablet (20 mg total) by mouth daily.  06/11/21   Einar Pheasant, MD  Azelastine-Fluticasone [facility]) 137-50 MCG/ACT SUSP Place 1 spray into the nose in the morning and at bedtime. 05/21/21   Einar Pheasant, MD  blood glucose meter kit and supplies Dispense based on patient and insurance preference. Use up to four times daily as directed. (FOR ICD-10 E10.9, E11.9). 11/11/19   Einar Pheasant, MD  carvedilol (COREG) 25 MG tablet Take 1 tablet (25 mg total) by mouth 2 (two) times daily with a meal. 04/26/21   Einar Pheasant, MD  Dulaglutide (TRULICITY) 9.56 LO/7.5IE SOPN INJECT 0.75MG SUBQ ONCE A WEEK 06/22/21   Einar Pheasant, MD  hydrochlorothiazide (HYDRODIURIL) 50 MG tablet TAKE 1 TABLET BY MOUTH ONCE DAILY 06/06/21   Einar Pheasant, MD  levocetirizine  (XYZAL) 5 MG tablet Take 1 tablet (5 mg total) by mouth every evening. 02/14/21   Einar Pheasant, MD  lisinopril (ZESTRIL) 40 MG tablet TAKE 1 TABLET BY MOUTH ONCE DAILY 03/26/21   Einar Pheasant, MD  metFORMIN (GLUCOPHAGE) 1000 MG tablet TAKE 1 TABLET BY MOUTH TWICE DAILY WITH MEALS 05/31/21   Einar Pheasant, MD  montelukast (SINGULAIR) 10 MG tablet Take 1 tablet (10 mg total) by mouth at bedtime. 02/14/21   Einar Pheasant, MD  Multiple Vitamin (MULTIVITAMIN WITH MINERALS) TABS tablet Take 1 tablet by mouth in the morning.    [provider]  pantoprazole (PROTONIX) 40 MG tablet TAKE 1 TABLET BY MOUTH ONCE DAILY 05/07/21   Einar Pheasant, MD  pioglitazone (ACTOS) 15 MG tablet Take 1 tablet (15 mg total) by mouth daily. 02/14/21   Einar Pheasant, MD  sertraline (ZOLOFT) 100 MG tablet Take 1 tablet (100 mg total) by mouth daily. 02/14/21   Einar Pheasant, MD  tamsulosin (FLOMAX) 0.4 MG CAPS capsule Take 1 capsule (0.4 mg total) by mouth daily with breakfast. 02/21/21 02/21/22  Billey Co, MD  testosterone cypionate (DEPOTESTOSTERONE CYPIONATE) 200 MG/ML injection Inject 1 mL (200 mg total) into the muscle every 14 (fourteen) days. 02/21/21   Billey Co, MD  traZODone (DESYREL) 50 MG tablet Take 1.5 tablets (75 mg total) by mouth at bedtime. 02/14/21   Einar Pheasant, MD    Physical Exam   Triage Vital Signs: ED Triage Vitals  Enc Vitals Group     BP 06/29/21 2146 (!) 142/80     Pulse Rate 06/29/21 2146 87     Resp 06/29/21 2146 19     Temp 06/29/21 2146 98.9 F (37.2 C)     Temp Source 06/29/21 2146 Oral     SpO2 06/29/21 2146 93 %     Weight 06/29/21 2147 203 lb (92.1 kg)     Height 06/29/21 2147 '5\' 9"'  (1.753 m)     Head Circumference --      Peak Flow --      Pain Score 06/29/21 2147 5     Pain Loc --      Pain Edu? --      Excl. in [place]? --     Most recent vital signs: Vitals:   06/30/21 0040 06/30/21 0100  BP: (!) 146/57 (!) 128/53  Pulse: 87 84  Resp:  20 18  Temp:    SpO2: 94% 94%    CONSTITUTIONAL: Alert and oriented and responds appropriately to questions. Well-appearing; well-nourished, obese, nontoxic, elderly HEAD: Normocephalic, atraumatic EYES: Conjunctivae clear, pupils appear equal, sclera nonicteric ENT: normal nose; moist mucous membranes NECK: Supple, normal ROM CARD: RRR; S1 and S2 appreciated; no murmurs, no clicks, no rubs,  no gallops RESP: Normal chest excursion without splinting or tachypnea; breath sounds clear and equal bilaterally; no wheezes, no rhonchi, no rales, no hypoxia or respiratory distress, speaking full sentences ABD/GI: Normal bowel sounds; non-distended; soft, non-tender, no rebound, no guarding, no peritoneal signs BACK: The back appears normal EXT: Normal ROM in all joints; no deformity noted, no edema; no cyanosis, no calf tenderness or calf swelling SKIN: Normal color for age and race; warm; no rash on exposed skin NEURO: Moves all extremities equally, normal speech PSYCH: The patient's mood and manner are appropriate.   ED Results / Procedures / Treatments   LABS: (all labs ordered are listed, but only abnormal results are displayed) Labs Reviewed  CBC WITH DIFFERENTIAL/PLATELET - Abnormal; Notable for the following components:      Result Value   Hemoglobin 12.1 (*)    HCT 38.7 (*)    All other components within normal limits  COMPREHENSIVE METABOLIC PANEL - Abnormal; Notable for the following components:   Glucose, Bld 160 (*)    Calcium 8.6 (*)    All other components within normal limits  URINALYSIS, ROUTINE W REFLEX MICROSCOPIC - Abnormal; Notable for the following components:   Color, Urine YELLOW (*)    APPearance CLEAR (*)    All other components within normal limits  TROPONIN I (HIGH SENSITIVITY) - Abnormal; Notable for the following components:   Troponin I (High Sensitivity) 27 (*)    All other components within normal limits  TROPONIN I (HIGH SENSITIVITY) - Abnormal;  Notable for the following components:   Troponin I (High Sensitivity) 30 (*)    All other components within normal limits  RESP PANEL BY RT-PCR (FLU A&B, COVID) ARPGX2  CULTURE, BLOOD (ROUTINE X 2)  CULTURE, BLOOD (ROUTINE X 2)  LACTIC ACID, PLASMA  PROCALCITONIN     EKG:  EKG Interpretation  Date/Time:  Saturday June 29 2021 21:50:47 EST Ventricular Rate:  85 PR Interval:  194 QRS Duration: 130 QT Interval:  380 QTC Calculation: 452 R Axis:   87 Text Interpretation: Normal sinus rhythm Left bundle branch block No significant change since last tracing Confirmed by Pryor Curia [phone]) on 06/30/2021 12:02:49 AM         RADIOLOGY: My personal review and interpretation of imaging: Chest x-ray shows bilateral pneumonia.  I have personally reviewed all radiology reports.   DG Chest 2 View  Result Date: 06/29/2021 CLINICAL DATA:  Shortness of breath. Chest congestion, cough and body aches. EXAM: CHEST - 2 VIEW COMPARISON:  10/11/2020 FINDINGS: Heart size upper limits of normal. Aortic atherosclerosis as seen previously. Bronchial thickening with patchy infiltrates in both lower lobes consistent with bronchopneumonia. No effusion. No lobar consolidation or collapse. Chronic compression deformities of the thoracolumbar junction region as seen previously. IMPRESSION: Bilateral lower lobe bronchitis and patchy bronchopneumonia. Electronically Signed   By: Nelson Chimes M.D.   On: 06/29/2021 22:25     PROCEDURES:  Critical Care performed: Yes, see critical care procedure note(s)   CRITICAL CARE Performed by: Cyril Mourning Desa Rech   Total critical care time: 45 minutes  Critical care time was exclusive of separately billable procedures and treating other patients.  Critical care was necessary to treat or prevent imminent or life-threatening deterioration.  Critical care was time spent personally by me on the following activities: development of treatment plan with patient and/or  surrogate as well as nursing, discussions with consultants, evaluation of patient's response to treatment, examination of patient, obtaining history from patient or surrogate, ordering  and performing treatments and interventions, ordering and review of laboratory studies, ordering and review of radiographic studies, pulse oximetry and re-evaluation of patient's condition.   Marland Kitchen1-3 Lead EKG Interpretation Performed by: Laderius Valbuena, Delice Bison, DO Authorized by: Lodie Waheed, Delice Bison, DO     Interpretation: normal     ECG rate:  85   ECG rate assessment: normal     Rhythm: sinus rhythm     Ectopy: none     Conduction: normal      IMPRESSION / MDM / ASSESSMENT AND PLAN / ED COURSE  I reviewed the triage vital signs and the nursing notes.    Patient here with complaints of shortness of breath, chills, body aches, cough.  The patient is on the cardiac monitor to evaluate for evidence of arrhythmia and/or significant heart rate changes.   DIFFERENTIAL DIAGNOSIS (includes but not limited to):   Pneumonia, COVID-19, influenza, other viral URI, less likely ACS, PE, pneumothorax, CHF, dissection   PLAN: We will obtain CBC, CMP, troponin, COVID and flu swabs, chest x-ray, urine, blood cultures.  Will give Tylenol.  Will ambulate with pulse oximetry.  No hypoxia at rest.   MEDICATIONS GIVEN IN ED: Medications  azithromycin (ZITHROMAX) 500 mg in sodium chloride 0.9 % 250 mL IVPB (500 mg Intravenous New Bag/Given 06/30/21 0030)  cefTRIAXone (ROCEPHIN) 2 g in sodium chloride 0.9 % 100 mL IVPB (0 g Intravenous Stopped 06/30/21 0023)  acetaminophen (TYLENOL) tablet 1,000 mg (1,000 mg Oral Given 06/30/21 0030)     ED COURSE: Patient's labs show no leukocytosis.  Normal electrolytes.  Lactic normal.  Urine does not appear infected.  Chest x-ray reviewed by myself and radiologist and shows bilateral lower lobe pneumonia.  Will give Rocephin and azithromycin.  Lactic normal.  COVID and flu negative.  Mildly elevated  troponins that are stable.  His chest pain seems atypical for ACS.  His EKG is nonischemic and shows a stable left bundle branch block.  He did however become hypoxic to the upper 80s on room air at rest and does not wear oxygen at home.  He is currently on 4 L.  Patient will require admission.   CONSULTS:  Consulted and discussed patient's case with hospitalist, Dr. Damita Dunnings.  I have recommended admission and consulting physician agrees and will place admission orders.  Patient (and family if present) agree with this plan.   I reviewed all nursing notes, vitals, pertinent previous records.  All labs, EKGs, imaging ordered have been independently reviewed and interpreted by myself.    OUTSIDE RECORDS REVIEWED: Reviewed patient's recent office visits on 04/10/2022 and 06/21/2021 with his primary care provider.         FINAL CLINICAL IMPRESSION(S) / ED DIAGNOSES   Final diagnoses:  Community acquired bilateral lower lobe pneumonia  Acute respiratory failure with hypoxia ([place])     Rx / DC Orders   ED Discharge Orders     None        Note:  This document was prepared using Dragon voice recognition software and may include unintentional dictation errors.   Abi Shoults, Delice Bison, DO 06/30/21 [phone]

## 2021-06-30 NOTE — Assessment & Plan Note (Addendum)
No chest pain. -Continue aspirin, atorvastatin and carvedilol and lisinopril

## 2021-06-30 NOTE — TOC CM/SW Note (Signed)
°  Transition of Care [facility]) Screening Note   Patient Details  Name: Ricardo Nash Date of Birth: December 28, 1945   Transition of Care [facility]) CM/SW Contact:    Magnus Ivan, LCSW Phone Number: 06/30/2021, 12:38 PM    [facility] of [facility]) has reviewed patient and no TOC needs have been identified at this time. We will continue to monitor patient advancement through interdisciplinary progression rounds. If new patient transition needs arise, please place a TOC consult.

## 2021-06-30 NOTE — ED Notes (Signed)
Patient resting comfortably on stretcher. RR even and unlabored. Patient remains on 4lpm oxygen via [place]. Patient's wife has been encouraged to go home due to her being so tired. Patient's belongings including his hearing aids were bagged and patient's wife was taking them home. Provider was at bedside to speak with them both to update the plan of care.

## 2021-06-30 NOTE — Assessment & Plan Note (Addendum)
O2 sat 87% on room air with increased work of breathing, initially requiring 4 L of oxygen, worsening hypoxia and currently saturating on 45 L with heated high flow.  No baseline oxygen requirement.  CTA negative for PE -Supplemental O2 to keep sats over 90%-wean as tolerated -Pulmonary consult -Added low-dose steroid -Incentive spirometry and flutter valve

## 2021-06-30 NOTE — Assessment & Plan Note (Addendum)
-  Continue carvedilol and aspirin -Not on any anticoagulation at home

## 2021-06-30 NOTE — Progress Notes (Signed)
Patient has home cpap for use. However, due to need for higher o2 requirements v60 used for now. Home cpap at bedside. Able to use home nasal mask with setup. Good volumes noted. Will continue to monitor

## 2021-06-30 NOTE — H&P (Addendum)
History and Physical    Patient: Ricardo Nash DOB: November 03, 1945 DOA: 06/29/2021 DOS: the patient was seen and examined on 06/30/2021 PCP: Einar Pheasant, MD  Patient coming from: Home  Chief Complaint:  Chief Complaint  Patient presents with   Nasal Congestion    HPI: Ricardo Nash is a 76 y.o. male with medical history significant of Paroxysmal atrial fibrillation not on anticoagulation, diabetes, hypertension, CAD on aspirin and Plavix, chronic LBBB, who presents to the ED with a 2-day history of body aches, chills, cough and congestion starting on his return from a cruise to the [place]. He also had a fever of 102. He has associated shortness of breath.  States his symptoms are similar to when he had pneumonia sometime ago.  He denies lower extremity pain or swelling.  ED course: On arrival afebrile with pulse 87, BP 142/80, O2 sat 87% on room air improving to 94% on 4 L Blood work: WBC normal at 9.3 with lactic acid 1.6 Troponin 27-30 COVID and flu negative  EKG, personally viewed and interpreted: NSR at 85 with LBBB  Chest x-ray shows bilateral lower lobe bronchitis and patchy bronchopneumonia  Patient started on Rocephin and azithromycin.  Hospitalist consulted for admission.   Review of Systems: As mentioned in the history of present illness. All other systems reviewed and are negative. Past Medical History:  Diagnosis Date   C. difficile colitis    Coronary artery disease    Depression    Diabetes mellitus ([place])    Diverticulitis    Diverticulosis    Dysrhythmia    PAF   Environmental allergies    GERD (gastroesophageal reflux disease)    Goiter    intrathoracic, s/p benign biopsy (Dr Harlow Asa)   Hypercholesterolemia    Hypertension    Osteoarthritis    cervical spine, lumbar spine   Paroxysmal atrial fibrillation (HCC)    Pneumonia    PONV (postoperative nausea and vomiting)    Sleep apnea    CPAP @ NIGHT   Stroke [facility])    TIA  (transient ischemic attack)    Past Surgical History:  Procedure Laterality Date   BACK SURGERY  8/08   s/p fusion of L4-L5   capsule endoscopy     CATARACT EXTRACTION  2011   Dr. Herbert Deaner   CERVICAL DISC SURGERY  2002   COLONOSCOPY WITH PROPOFOL N/A 07/20/2017   Procedure: COLONOSCOPY WITH PROPOFOL;  Surgeon: Manya Silvas, MD;  Location: [facility] ENDOSCOPY;  Service: Endoscopy;  Laterality: N/A;   EYE SURGERY     cataract bilateral 10/1999   eyelid reduction     Dr. Kerby Less HERNIA REPAIR  1991   Dr, Cassell Clement LAMINECTOMY/DECOMPRESSION MICRODISCECTOMY N/A 10/15/2020   Procedure: Laminectomy - Lumbar Two-Lumbar Three - Lumbar Three-Lumbar Four with sublaminar decompression;  Surgeon: Eustace Moore, MD;  Location: [place];  Service: Neurosurgery;  Laterality: N/A;  Laminectomy - Lumbar Two-Lumbar Three - Lumbar Three-Lumbar Four with sublaminar decompression   NOSE SURGERY     turbinate reduction   SEPTOPLASTY  1975   SHOULDER SURGERY  2000   rotator cuff   Social History:  reports that he has quit smoking. He has never used smokeless tobacco. He reports current alcohol use. He reports that he does not use drugs.  Allergies  Allergen Reactions   Penicillins Hives    Family History  Problem Relation Age of Onset   Congestive Heart Failure Father    Heart  disease Father        myocardial infarction   Rheumatic fever Father        valvular disease   Heart disease Mother        s/p CABG (age 14)   Kidney disease Sister    Colon cancer Neg Hx    Prostate cancer Neg Hx     Prior to Admission medications   Medication Sig Start Date End Date Taking? Authorizing Provider  ACCU-CHEK GUIDE test strip USE AS DIRECTED TO TEST BLOOD GLUCOSE LEVELS TWICE DAILY 11/19/20   Einar Pheasant, MD  ALPRAZolam Duanne Moron) 0.25 MG tablet TAKE 1 TABLET BY MOUTH ONCE DAILY AS NEEDED. 04/17/21   Einar Pheasant, MD  amLODipine (NORVASC) 10 MG tablet TAKE 1 TABLET BY MOUTH ONCE DAILY 06/06/21    Einar Pheasant, MD  Ascorbic Acid (VITAMIN C) 100 MG tablet Take 100 mg by mouth in the morning.    [provider]  aspirin EC 81 MG tablet Take 81 mg by mouth every evening. Swallow whole.    [provider]  atorvastatin (LIPITOR) 20 MG tablet Take 1 tablet (20 mg total) by mouth daily. 06/11/21   Einar Pheasant, MD  Azelastine-Fluticasone [facility]) 137-50 MCG/ACT SUSP Place 1 spray into the nose in the morning and at bedtime. 05/21/21   Einar Pheasant, MD  blood glucose meter kit and supplies Dispense based on patient and insurance preference. Use up to four times daily as directed. (FOR ICD-10 E10.9, E11.9). 11/11/19   Einar Pheasant, MD  carvedilol (COREG) 25 MG tablet Take 1 tablet (25 mg total) by mouth 2 (two) times daily with a meal. 04/26/21   Einar Pheasant, MD  Dulaglutide (TRULICITY) 0.31 RX/4.5OP SOPN INJECT 0.75MG SUBQ ONCE A WEEK 06/22/21   Einar Pheasant, MD  hydrochlorothiazide (HYDRODIURIL) 50 MG tablet TAKE 1 TABLET BY MOUTH ONCE DAILY 06/06/21   Einar Pheasant, MD  levocetirizine (XYZAL) 5 MG tablet Take 1 tablet (5 mg total) by mouth every evening. 02/14/21   Einar Pheasant, MD  lisinopril (ZESTRIL) 40 MG tablet TAKE 1 TABLET BY MOUTH ONCE DAILY 03/26/21   Einar Pheasant, MD  metFORMIN (GLUCOPHAGE) 1000 MG tablet TAKE 1 TABLET BY MOUTH TWICE DAILY WITH MEALS 05/31/21   Einar Pheasant, MD  montelukast (SINGULAIR) 10 MG tablet Take 1 tablet (10 mg total) by mouth at bedtime. 02/14/21   Einar Pheasant, MD  Multiple Vitamin (MULTIVITAMIN WITH MINERALS) TABS tablet Take 1 tablet by mouth in the morning.    [provider]  pantoprazole (PROTONIX) 40 MG tablet TAKE 1 TABLET BY MOUTH ONCE DAILY 05/07/21   Einar Pheasant, MD  pioglitazone (ACTOS) 15 MG tablet Take 1 tablet (15 mg total) by mouth daily. 02/14/21   Einar Pheasant, MD  sertraline (ZOLOFT) 100 MG tablet Take 1 tablet (100 mg total) by mouth daily. 02/14/21   Einar Pheasant, MD  tamsulosin  (FLOMAX) 0.4 MG CAPS capsule Take 1 capsule (0.4 mg total) by mouth daily with breakfast. 02/21/21 02/21/22  Billey Co, MD  testosterone cypionate (DEPOTESTOSTERONE CYPIONATE) 200 MG/ML injection Inject 1 mL (200 mg total) into the muscle every 14 (fourteen) days. 02/21/21   Billey Co, MD  traZODone (DESYREL) 50 MG tablet Take 1.5 tablets (75 mg total) by mouth at bedtime. 02/14/21   Einar Pheasant, MD    Physical Exam: Vitals:   06/30/21 0003 06/30/21 0040 06/30/21 0100 06/30/21 0130  BP:  (!) 146/57 (!) 128/53 (!) 131/58  Pulse:  87 84 81  Resp:  20 18   Temp:      TempSrc:      SpO2: 92% 94% 94% 94%  Weight:      Height:       Physical Exam Vitals and nursing note reviewed.  Constitutional:      General: He is not in acute distress.    Appearance: Normal appearance.  HENT:     Head: Normocephalic and atraumatic.  Cardiovascular:     Rate and Rhythm: Normal rate and regular rhythm.     Pulses: Normal pulses.     Heart sounds: Normal heart sounds. No murmur heard. Pulmonary:     Effort: Pulmonary effort is normal. Tachypnea present.     Breath sounds: Examination of the right-middle field reveals rales. Examination of the right-lower field reveals rales. Examination of the left-lower field reveals rales. Rales present. No wheezing or rhonchi.  Abdominal:     General: Bowel sounds are normal.     Palpations: Abdomen is soft.     Tenderness: There is no abdominal tenderness.  Musculoskeletal:        General: No swelling or tenderness. Normal range of motion.     Cervical back: Normal range of motion and neck supple.  Skin:    General: Skin is warm and dry.  Neurological:     General: No focal deficit present.     Mental Status: He is alert. Mental status is at baseline.     Comments: Facial palsy/bell's palsy right, old  Psychiatric:        Mood and Affect: Mood normal.        Behavior: Behavior normal.     Data Reviewed: Notes from primary care and  specialist visits, past discharge summaries. Prior diagnostic testing as applicable to current admission diagnoses Updated medications and problem lists for reconciliation ED course, including vitals, labs, imaging, treatment and response to treatment Triage notes and ED providers notes   Assessment and Plan: Pneumonia- (present on admission) -IV Rocephin and azithromycin IV fluids Antitussives, DuoNebs as needed Supportive care Supplemental oxygen to keep sats over 94%  Acute respiratory failure with hypoxia (HCC) O2 sat 87% on room air with increased work of breathing Supplemental O2 to keep sats over 94% Low suspicion for PE in spite of recent travel in the setting of chest x-ray showing bronchopneumonia and respiratory symptoms  CAD (coronary artery disease), native coronary artery- (present on admission) Continue aspirin, atorvastatin and carvedilol and lisinopril  Paroxysmal atrial fibrillation (HCC) Continue carvedilol and aspirin  Hypertension- (present on admission) Continue carvedilol, amlodipine, lisinopril and hydrochlorothiazide  Diabetes mellitus ([place]) Sliding scale insulin coverage       Advance Care Planning:   Code Status: Prior full  Consults: none  Family Communication: none  Severity of Illness: The appropriate patient status for this patient is INPATIENT. Inpatient status is judged to be reasonable and necessary in order to provide the required intensity of service to ensure the patient's safety. The patient's presenting symptoms, physical exam findings, and initial radiographic and laboratory data in the context of their chronic comorbidities is felt to place them at high risk for further clinical deterioration. Furthermore, it is not anticipated that the patient will be medically stable for discharge from the hospital within 2 midnights of admission.   * I certify that at the point of admission it is my clinical judgment that the patient will  require inpatient hospital care spanning beyond 2 midnights from the point of admission due to high  intensity of service, high risk for further deterioration and high frequency of surveillance required.*  Author: Athena Masse, MD 06/30/2021 1:50 AM  For on call review [email or web address].

## 2021-07-01 ENCOUNTER — Inpatient Hospital Stay: Payer: Medicare PPO

## 2021-07-01 ENCOUNTER — Inpatient Hospital Stay
Admit: 2021-07-01 | Discharge: 2021-07-01 | Disposition: A | Discharge status: Home / Self Care | Payer: Medicare PPO | Attending: Student in an Organized Health Care Education/Training Program | Admitting: Student in an Organized Health Care Education/Training Program

## 2021-07-01 ENCOUNTER — Encounter: Payer: Self-pay | Admitting: Internal Medicine

## 2021-07-01 DIAGNOSIS — J9601 Acute respiratory failure with hypoxia: Secondary | ICD-10-CM

## 2021-07-01 LAB — RESPIRATORY PANEL BY PCR

## 2021-07-01 LAB — BRAIN NATRIURETIC PEPTIDE: B Natriuretic Peptide: 123.4 pg/mL — ABNORMAL HIGH (ref 0.0–100.0)

## 2021-07-01 LAB — LEGIONELLA PNEUMOPHILA SEROGP 1 UR AG: L. pneumophila Serogp 1 Ur Ag: NEGATIVE

## 2021-07-01 LAB — EXPECTORATED SPUTUM ASSESSMENT W GRAM STAIN, RFLX TO RESP C

## 2021-07-01 LAB — GLUCOSE, CAPILLARY
Glucose-Capillary: 137 mg/dL — ABNORMAL HIGH (ref 70–99)
Glucose-Capillary: 128 mg/dL — ABNORMAL HIGH (ref 70–99)
Glucose-Capillary: 181 mg/dL — ABNORMAL HIGH (ref 70–99)

## 2021-07-01 LAB — MRSA NEXT GEN BY PCR, NASAL: MRSA by PCR Next Gen: NOT DETECTED

## 2021-07-01 MED ORDER — VANCOMYCIN HCL 1500 MG/300ML IV SOLN
1500.0000 mg | Freq: Once | INTRAVENOUS | Status: DC
  Filled 2021-07-01: qty 300

## 2021-07-01 MED ORDER — METHYLPREDNISOLONE SODIUM SUCC 40 MG IJ SOLR
40.0000 mg | Freq: Every day | INTRAMUSCULAR | Status: DC
  Administered 2021-07-01: 11:00:00 40 mg via INTRAVENOUS
  Filled 2021-07-01: qty 1

## 2021-07-01 MED ORDER — IPRATROPIUM-ALBUTEROL 0.5-2.5 (3) MG/3ML IN SOLN
3.0000 mL | Freq: Four times a day (QID) | RESPIRATORY_TRACT | Status: DC
  Administered 2021-07-01 – 2021-07-06 (×20): 3 mL via RESPIRATORY_TRACT
  Filled 2021-07-01 (×20): qty 3

## 2021-07-01 MED ORDER — FUROSEMIDE 10 MG/ML IJ SOLN
60.0000 mg | Freq: Once | INTRAMUSCULAR | Status: AC
  Administered 2021-07-01: 60 mg via INTRAVENOUS
  Filled 2021-07-01: qty 8

## 2021-07-01 MED ORDER — GUAIFENESIN ER 600 MG PO TB12
1200.0000 mg | ORAL_TABLET | Freq: Two times a day (BID) | ORAL | Status: DC
  Administered 2021-07-01 – 2021-07-08 (×15): 1200 mg via ORAL
  Filled 2021-07-01 (×15): qty 2

## 2021-07-01 MED ORDER — IOHEXOL 350 MG/ML SOLN
75.0000 mL | Freq: Once | INTRAVENOUS | Status: AC | PRN
  Administered 2021-07-01: 10:00:00 75 mL via INTRAVENOUS

## 2021-07-01 MED ORDER — SODIUM CHLORIDE 0.9 % IV SOLN
2.0000 g | Freq: Three times a day (TID) | INTRAVENOUS | Status: DC
  Administered 2021-07-01 – 2021-07-02 (×4): 2 g via INTRAVENOUS
  Filled 2021-07-01 (×6): qty 2

## 2021-07-01 NOTE — Progress Notes (Signed)
Late entry: After assessment and nebulizer tx done, I attempted to place patient on hospital unit. Patient stated the machine dries him up and he can not tolerate it. I attempted to place patient on home unit but because of O2 requirement, I will leave him on HFNC at this time.

## 2021-07-01 NOTE — Progress Notes (Signed)
*  PRELIMINARY RESULTS* Echocardiogram 2D Echocardiogram has been performed.  Ricardo Nash 07/01/2021, 10:43 AM

## 2021-07-01 NOTE — Progress Notes (Signed)
Xanax administered at 9:40 pm

## 2021-07-01 NOTE — Progress Notes (Signed)
Pt transferred to 2A on 15L NRB.  Alert at time of arrival.  Placed on tele box.  Oriented to room and call bell within reach.  POC continued

## 2021-07-01 NOTE — Progress Notes (Signed)
Patients oxygen requirements increased throughout shift. Charge Nurse, MD, Respiratory and ICU charge nurse made aware. New orders placed. Oxygen demand continued to increase, patient transferred to 2A.

## 2021-07-01 NOTE — Progress Notes (Signed)
Progress Note:    Ricardo Nash    UJW:[id number] DOB: 1945-12-06 DOA: 06/29/2021  PCP: Einar Pheasant, MD    Brief Narrative:   76 year old Caucasian male with a past medical history significant for paroxysmal atrial fibrillation not on chronic anticoagulation, non-insulin-dependent diabetes mellitus type 2, essential hypertension, coronary artery disease on DAPT: Aspirin and Plavix, chronic LBBB.  This patient presented to the emergency department with a 2-day history of body aches, chills, cough and congestion after returning from a cruise to the [place].  He also had a fever of 102 F at home.  Also has shortness of breath.  No chest pain or lower extremity pain/swelling.  Not on oxygen at home.  ED course: On arrival afebrile with pulse 87, BP 142/80, O2 sat 87% on room air improving to 94% on 4 L Blood work: WBC normal at 9.3 with lactic acid 1.6 Troponin 27-30 COVID and flu negative   EKG, personally viewed and interpreted: NSR at 85 with LBBB   Initial CXR showed bilateral lower lobe bronchitis and patchy bronchopneumonia   Patient started on Rocephin and azithromycin in ED.  Hospitalist consulted for admission.     Subjective:   Seen this morning with wife present at bedside.  Respiratory status worsening and now on high flow nasal cannula 15 L.  Not in respiratory distress and appears comfortable now on 15L.  Diffuse rhonchi on lung auscultation.  Not encephalopathic.  Former tobacco use.  Smoked for 20 years 1/2 packs/day.  No known history of COPD.  Denies any chest pain.    Assessment and Plan:   Acute hypoxic respiratory failure: Secondary to multifocal pneumonia as noted on CT PE study. No pulmonary embolism.  Currently on 15 L high flow nasal cannula.  Respiratory therapy aware and heated high flow nasal cannula or BiPAP on standby.  Start continuous pulse oximetry.  Discontinue ceftriaxone.  Start cefepime.  Continue Zithromax.  Sputum culture pending  results.  Full RVP panel negative.  COVID/influenza/RSV negative.  The patient did receive 1 dose of Solu-Medrol today but has no wheezing on exam and CT PE study showed no chronic lung changes.  No further steroids indicated.  Did get 1 dose of IV Lasix but CT study showed no pulmonary edema or vascular congestion.  No further IV diuresis indicated.  BNP was also unremarkable.  Continue supportive care with mucolytics and antitussives as needed.  Wean oxygen as tolerated.  Strep pneumo urinary antigen negative.  Legionella pending results.  Continue scheduled bronchodilators with DuoNebs. Trend CRP. Echo ordered on admission and pending read.  Coronary artery disease: Continue home DAPT: Aspirin and Plavix at home atorvastatin  Paroxysmal atrial fibrillation: Continue home carvedilol.  Not on chronic anticoagulation  Hypertension: Continue home carvedilol, amlodipine, lisinopril and HCTZ  Non-insulin-dependent diabetes mellitus type 2: Continue sliding scale insulin.  Home metformin on hold.  Continue home Actos.  Home Trulicity on hold.  Depression/anxiety: Continue home sertraline and Xanax as needed  Allergic rhinitis: Continue home Xyzal and Singulair  GERD: Continue home Protonix  Former tobacco use disorder: No acute treatment   Other information:    DVT prophylaxis: Lovenox subcu Code Status: Full code Family Communication: Communicated with wife who was present at bedside upon my evaluation Disposition:   Status is: Inpatient Remains inpatient appropriate because: Still remains hypoxic and on 15 L HFNC    Consultants:   None    Objective:    Vitals:   07/01/21 0008 07/01/21  5621 07/01/21 0750 07/01/21 0829  BP: (!) 141/94 126/60 (!) 155/60   Pulse: 81 83 86   Resp: 20 18 16    Temp: 98.9 F (37.2 C) 98.9 F (37.2 C) 98.8 F (37.1 C)   TempSrc: Oral Oral Oral   SpO2: 95% 94% 94% 92%  Weight:      Height:        Intake/Output Summary (Last 24 hours) at  07/01/2021 1103 Last data filed at 07/01/2021 0945 Gross per 24 hour  Intake 580 ml  Output 625 ml  Net -45 ml   Filed Weights   06/29/21 2147  Weight: 92.1 kg       Physical Exam:    General exam: Appears calm and comfortable  Respiratory system: Diffuse rhonchi throughout.  Respiratory effort normal.  No increased work of breathing Cardiovascular system: S1 & S2 heard, RRR. No JVD, murmurs, rubs, gallops or clicks.  1+ edema pretibial up to the low shins bilaterally. Gastrointestinal system: Abdomen is nondistended, soft and nontender. No organomegaly or masses felt. Normal bowel sounds heard. Central nervous system: Alert and oriented. No focal neurological deficits. Extremities: Symmetric 5 x 5 power. Skin: No rashes, lesions or ulcers Psychiatry: Judgement and insight appear normal. Mood & affect appropriate.     Data Reviewed:    I have personally reviewed following labs and imaging studies  CBC: Recent Labs  Lab 06/29/21 2200  WBC 9.3  NEUTROABS 7.6  HGB 12.1*  HCT 38.7*  MCV 91.3  PLT 308    Basic Metabolic Panel: Recent Labs  Lab 06/29/21 2200  NA 135  K 4.3  CL 101  CO2 24  GLUCOSE 160*  BUN 20  CREATININE 0.96  CALCIUM 8.6*    GFR: Estimated Creatinine Clearance: 72.3 mL/min (by C-G formula based on SCr of 0.96 mg/dL).  Liver Function Tests: Recent Labs  Lab 06/29/21 2200  AST 28  ALT 25  ALKPHOS 48  BILITOT 1.2  PROT 7.1  ALBUMIN 3.7    CBG: Recent Labs  Lab 06/30/21 0744 06/30/21 1211 06/30/21 1643 06/30/21 2024 07/01/21 0752  GLUCAP 149* 171* 177* 131* 137*     Recent Results (from the past 240 hour(s))  Resp Panel by RT-PCR (Flu A&B, Covid) Nasopharyngeal Swab     Status: None   Collection Time: 06/29/21 10:00 PM   Specimen: Nasopharyngeal Swab; Nasopharyngeal(NP) swabs in vial transport medium  Result Value Ref Range Status   SARS Coronavirus 2 by RT PCR NEGATIVE NEGATIVE Final    Comment: (NOTE) SARS-CoV-2  target nucleic acids are NOT DETECTED.  The SARS-CoV-2 RNA is generally detectable in upper respiratory specimens during the acute phase of infection. The lowest concentration of SARS-CoV-2 viral copies this assay can detect is 138 copies/mL. A negative result does not preclude SARS-Cov-2 infection and should not be used as the sole basis for treatment or other patient management decisions. A negative result may occur with  improper specimen collection/handling, submission of specimen other than nasopharyngeal swab, presence of viral mutation(s) within the areas targeted by this assay, and inadequate number of viral copies(<138 copies/mL). A negative result must be combined with clinical observations, patient history, and epidemiological information. The expected result is Negative.  Fact Sheet for Patients:  EntrepreneurPulse.com.au  Fact Sheet for Healthcare Providers:  IncredibleEmployment.be  This test is no t yet approved or cleared by the [place] FDA and  has been authorized for detection and/or diagnosis of SARS-CoV-2 by FDA under an Emergency Use Authorization (EUA).  This EUA will remain  in effect (meaning this test can be used) for the duration of the COVID-19 declaration under Section 564(b)(1) of the Act, 21 U.S.C.section 360bbb-3(b)(1), unless the authorization is terminated  or revoked sooner.       Influenza A by PCR NEGATIVE NEGATIVE Final   Influenza B by PCR NEGATIVE NEGATIVE Final    Comment: (NOTE) The Xpert Xpress SARS-CoV-2/FLU/RSV plus assay is intended as an aid in the diagnosis of influenza from Nasopharyngeal swab specimens and should not be used as a sole basis for treatment. Nasal washings and aspirates are unacceptable for Xpert Xpress SARS-CoV-2/FLU/RSV testing.  Fact Sheet for Patients: EntrepreneurPulse.com.au  Fact Sheet for Healthcare  Providers: IncredibleEmployment.be  This test is not yet approved or cleared by the [place] FDA and has been authorized for detection and/or diagnosis of SARS-CoV-2 by FDA under an Emergency Use Authorization (EUA). This EUA will remain in effect (meaning this test can be used) for the duration of the COVID-19 declaration under Section 564(b)(1) of the Act, 21 U.S.C. section 360bbb-3(b)(1), unless the authorization is terminated or revoked.  Performed at [facility], [place], [place] [address]   Respiratory (~20 pathogens) panel by PCR     Status: None   Collection Time: 06/29/21 10:00 PM   Specimen: Nasopharyngeal Swab; Respiratory  Result Value Ref Range Status   Adenovirus NOT DETECTED NOT DETECTED Final   Coronavirus 229E NOT DETECTED NOT DETECTED Final    Comment: (NOTE) The Coronavirus on the Respiratory Panel, DOES NOT test for the novel  Coronavirus (2019 nCoV)    Coronavirus HKU1 NOT DETECTED NOT DETECTED Final   Coronavirus NL63 NOT DETECTED NOT DETECTED Final   Coronavirus OC43 NOT DETECTED NOT DETECTED Final   Metapneumovirus NOT DETECTED NOT DETECTED Final   Rhinovirus / Enterovirus NOT DETECTED NOT DETECTED Final   Influenza A NOT DETECTED NOT DETECTED Final   Influenza B NOT DETECTED NOT DETECTED Final   Parainfluenza Virus 1 NOT DETECTED NOT DETECTED Final   Parainfluenza Virus 2 NOT DETECTED NOT DETECTED Final   Parainfluenza Virus 3 NOT DETECTED NOT DETECTED Final   Parainfluenza Virus 4 NOT DETECTED NOT DETECTED Final   Respiratory Syncytial Virus NOT DETECTED NOT DETECTED Final   Bordetella pertussis NOT DETECTED NOT DETECTED Final   Bordetella Parapertussis NOT DETECTED NOT DETECTED Final   Chlamydophila pneumoniae NOT DETECTED NOT DETECTED Final   Mycoplasma pneumoniae NOT DETECTED NOT DETECTED Final    Comment: Performed at [facility], [place]. [address], [place], [place] [address]  Culture, blood  (Routine X 2) w Reflex to ID Panel     Status: None (Preliminary result)   Collection Time: 06/29/21 11:50 PM   Specimen: Left Antecubital; Blood  Result Value Ref Range Status   Specimen Description LEFT ANTECUBITAL  Final   Special Requests   Final    BOTTLES DRAWN AEROBIC AND ANAEROBIC Blood Culture adequate volume   Culture   Final    NO GROWTH 1 DAY Performed at [facility], [address], [place], [place] [address]    Report Status PENDING  Incomplete  Culture, blood (Routine X 2) w Reflex to ID Panel     Status: None (Preliminary result)   Collection Time: 06/29/21 11:50 PM   Specimen: BLOOD LEFT HAND  Result Value Ref Range Status   Specimen Description BLOOD LEFT HAND  Final   Special Requests   Final    BOTTLES DRAWN AEROBIC AND  ANAEROBIC Blood Culture adequate volume   Culture   Final    NO GROWTH 1 DAY Performed at [facility], [address] [place], [place] [address]    Report Status PENDING  Incomplete  Expectorated Sputum Assessment w Gram Stain, Rflx to Resp Cult     Status: None   Collection Time: 06/30/21  1:57 AM   Specimen: Sputum  Result Value Ref Range Status   Specimen Description SPUTUM  Final   Special Requests NONE  Final   Sputum evaluation   Final    Sputum specimen not acceptable for testing.  Please recollect.   RESULT CALLED TO, READ BACK BY AND VERIFIED WITH: MAXUM OMBALA 06/30/21 @ 0623 BY SB Performed at [facility], [address] [place], [place] [address]    Report Status 06/30/2021 FINAL  Final  Expectorated Sputum Assessment w Gram Stain, Rflx to Resp Cult     Status: None   Collection Time: 07/01/21  8:28 AM   Specimen: Expectorated Sputum  Result Value Ref Range Status   Specimen Description EXPECTORATED SPUTUM  Final   Special Requests NONE  Final   Sputum evaluation   Final    THIS SPECIMEN IS ACCEPTABLE FOR SPUTUM CULTURE Performed at [facility], [address], [place], [place]  [address]    Report Status 07/01/2021 FINAL  Final  MRSA Next Gen by PCR, Nasal     Status: None   Collection Time: 07/01/21  9:32 AM   Specimen: Nasal Mucosa; Nasal Swab  Result Value Ref Range Status   MRSA by PCR Next Gen NOT DETECTED NOT DETECTED Final    Comment: (NOTE) The GeneXpert MRSA Assay (FDA approved for NASAL specimens only), is one component of a comprehensive MRSA colonization surveillance program. It is not intended to diagnose MRSA infection nor to guide or monitor treatment for MRSA infections. Test performance is not FDA approved in patients less than 34 years old. Performed at [facility], [address] [place], [place] [address]          Radiology Studies:    DG Chest 2 View  Result Date: 06/29/2021 CLINICAL DATA:  Shortness of breath. Chest congestion, cough and body aches. EXAM: CHEST - 2 VIEW COMPARISON:  10/11/2020 FINDINGS: Heart size upper limits of normal. Aortic atherosclerosis as seen previously. Bronchial thickening with patchy infiltrates in both lower lobes consistent with bronchopneumonia. No effusion. No lobar consolidation or collapse. Chronic compression deformities of the thoracolumbar junction region as seen previously. IMPRESSION: Bilateral lower lobe bronchitis and patchy bronchopneumonia. Electronically Signed   By: Nelson Chimes M.D.   On: 06/29/2021 22:25   CT Angio Chest Pulmonary Embolism (PE) W or WO Contrast  Result Date: 07/01/2021 CLINICAL DATA:  Pulmonary embolism (PE) suspected, unknown D-dimer EXAM: CT ANGIOGRAPHY CHEST WITH CONTRAST TECHNIQUE: Multidetector CT imaging of the chest was performed using the standard protocol during bolus administration of intravenous contrast. Multiplanar CT image reconstructions and MIPs were obtained to evaluate the vascular anatomy. RADIATION DOSE REDUCTION: This exam was performed according to the departmental dose-optimization program which includes automated exposure control, adjustment  of the mA and/or kV according to patient size and/or use of iterative reconstruction technique. CONTRAST:  54mL OMNIPAQUE IOHEXOL 350 MG/ML SOLN COMPARISON:  Radiographs 07/01/2021, 06/29/2021, 10/11/2020 FINDINGS: Cardiovascular: Respiratory motion artifact limits evaluation of the segmental and subsegmental vessels. There is no evidence of pulmonary embolism to the level of the lobar arteries. Mild cardiomegaly. No pericardial disease. Coronary artery  calcifications. Mild atherosclerotic calcifications of the aortic arch and descending aorta. Mediastinum/Nodes: There are a few prominent mediastinal and hilar lymph nodes, likely reactive. Enlarged, multinodular goiter with mediastinal extension, increased in size in comparison due prior chest CT and March 2012. Lungs/Pleura: High there is multifocal consolidation and ground-glass opacities bilaterally, most confluent in the lower lobes and right middle lobe into a slightly lesser degree in the upper lobes. Small bilateral pleural effusions. No pneumothorax. Upper Abdomen: Airways are patent. Musculoskeletal: Chronic anterior compression fracture of T12. No acute osseous abnormality. Diffuse idiopathic skeletal hyperostosis. Review of the MIP images confirms the above findings. IMPRESSION: Multifocal pneumonia.  Small bilateral pleural effusions. Respiratory motion artifact limits evaluation of the segmental and subsegmental pulmonary arteries. Within this limitation there is no evidence of pulmonary embolism to the level of the lobar arteries. Multinodular goiter with intrathoracic extension, increased in size in comparison to prior chest CT in March 2012. Aortic Atherosclerosis (ICD10-I70.0). Electronically Signed   By: Maurine Simmering M.D.   On: 07/01/2021 10:04   DG Chest Port 1 View  Result Date: 07/01/2021 CLINICAL DATA:  Dyspnea. EXAM: PORTABLE CHEST 1 VIEW COMPARISON:  06/29/2021 FINDINGS: Heart size is normal. Interval development of bilateral LOWER lobe  infiltrates a RIGHT slightly more confluent than the LEFT. IMPRESSION: New bilateral LOWER lobe infiltrates. Electronically Signed   By: Nolon Nations M.D.   On: 07/01/2021 08:55        Medications:    Scheduled Meds:  amLODipine  10 mg Oral Daily   aspirin EC  81 mg Oral QPM   atorvastatin  20 mg Oral Daily   azithromycin  500 mg Oral QHS   carvedilol  25 mg Oral BID WC   enoxaparin (LOVENOX) injection  40 mg Subcutaneous Q24H   fluticasone  2 spray Each Nare Daily   guaiFENesin  1,200 mg Oral BID   hydrochlorothiazide  50 mg Oral Daily   insulin aspart  0-15 Units Subcutaneous TID WC   ipratropium-albuterol  3 mL Nebulization Q6H   lisinopril  40 mg Oral Daily   loratadine  10 mg Oral Daily   mouth rinse  15 mL Mouth Rinse BID   metFORMIN  1,000 mg Oral BID WC   methylPREDNISolone (SOLU-MEDROL) injection  40 mg Intravenous Daily   montelukast  10 mg Oral QHS   pantoprazole  40 mg Oral Daily   pioglitazone  15 mg Oral Daily   sertraline  100 mg Oral Daily   tamsulosin  0.4 mg Oral Q breakfast   traZODone  75 mg Oral QHS   Continuous Infusions:  ceFEPime (MAXIPIME) IV     vancomycin         LOS: 1 day    Time spent: 35 minutes    Leslee Home, MD [facility]   To contact the attending provider between 7A-7P or the covering provider during after hours 7P-7A, please log into the web site [email or web address] and access using universal [place] password for that web site. If you do not have the password, please call the hospital operator.  07/01/2021, 11:03 AM

## 2021-07-02 LAB — ECHOCARDIOGRAM COMPLETE
AR max vel: 1.9 cm2
AV Area VTI: 1.95 cm2
AV Area mean vel: 2.04 cm2
AV Mean grad: 5 mmHg
AV Peak grad: 9.4 mmHg
Ao pk vel: 1.53 m/s
Area-P 1/2: 4.86 cm2
Height: 69 in
MV VTI: 1.77 cm2
S' Lateral: 3.99 cm
Weight: 3247.99 oz

## 2021-07-02 LAB — BASIC METABOLIC PANEL
Anion gap: 12 (ref 5–15)
BUN: 44 mg/dL — ABNORMAL HIGH (ref 8–23)
CO2: 27 mmol/L (ref 22–32)
Calcium: 8.1 mg/dL — ABNORMAL LOW (ref 8.9–10.3)
Chloride: 99 mmol/L (ref 98–111)
Creatinine, Ser: 1.5 mg/dL — ABNORMAL HIGH (ref 0.61–1.24)
GFR, Estimated: 48 mL/min — ABNORMAL LOW (ref >60)
Glucose, Bld: 144 mg/dL — ABNORMAL HIGH (ref 70–99)
Potassium: 4.3 mmol/L (ref 3.5–5.1)
Sodium: 138 mmol/L (ref 135–145)

## 2021-07-02 LAB — C-REACTIVE PROTEIN: CRP: 15.6 mg/dL — ABNORMAL HIGH (ref <1.0)

## 2021-07-02 LAB — CBC
HCT: 34.7 % — ABNORMAL LOW (ref 39.0–52.0)
Hemoglobin: 11.3 g/dL — ABNORMAL LOW (ref 13.0–17.0)
MCH: 28.6 pg (ref 26.0–34.0)
MCHC: 32.6 g/dL (ref 30.0–36.0)
MCV: 87.8 fL (ref 80.0–100.0)
Platelets: 227 10*3/uL (ref 150–400)
RBC: 3.95 MIL/uL — ABNORMAL LOW (ref 4.22–5.81)
RDW: 14.2 % (ref 11.5–15.5)
WBC: 12.3 10*3/uL — ABNORMAL HIGH (ref 4.0–10.5)
nRBC: 0 % (ref 0.0–0.2)

## 2021-07-02 LAB — GLUCOSE, CAPILLARY
Glucose-Capillary: 191 mg/dL — ABNORMAL HIGH (ref 70–99)
Glucose-Capillary: 145 mg/dL — ABNORMAL HIGH (ref 70–99)
Glucose-Capillary: 150 mg/dL — ABNORMAL HIGH (ref 70–99)
Glucose-Capillary: 147 mg/dL — ABNORMAL HIGH (ref 70–99)

## 2021-07-02 LAB — PROCALCITONIN: Procalcitonin: 0.39 ng/mL

## 2021-07-02 MED ORDER — METHYLPREDNISOLONE SODIUM SUCC 40 MG IJ SOLR: 20.0000 mg | Freq: Two times a day (BID) | INTRAMUSCULAR | Status: DC

## 2021-07-02 MED ORDER — FUROSEMIDE 10 MG/ML IJ SOLN
20.0000 mg | Freq: Two times a day (BID) | INTRAMUSCULAR | Status: DC
  Administered 2021-07-02 – 2021-07-03 (×2): 20 mg via INTRAVENOUS
  Filled 2021-07-02 (×2): qty 2

## 2021-07-02 MED ORDER — HYDRALAZINE HCL 20 MG/ML IJ SOLN: 10.0000 mg | Freq: Three times a day (TID) | INTRAMUSCULAR | Status: DC | PRN

## 2021-07-02 MED ORDER — METHYLPREDNISOLONE SODIUM SUCC 40 MG IJ SOLR: 40.0000 mg | Freq: Every day | INTRAMUSCULAR | Status: DC

## 2021-07-02 MED ORDER — SODIUM CHLORIDE 0.9 % IV SOLN
2.0000 g | Freq: Two times a day (BID) | INTRAVENOUS | Status: DC
  Administered 2021-07-02 – 2021-07-07 (×10): 2 g via INTRAVENOUS
  Filled 2021-07-02 (×12): qty 2

## 2021-07-02 MED ORDER — ALPRAZOLAM 0.25 MG PO TABS
0.5000 mg | ORAL_TABLET | Freq: Three times a day (TID) | ORAL | Status: DC | PRN
  Administered 2021-07-03 – 2021-07-07 (×5): 0.5 mg via ORAL
  Filled 2021-07-02 (×5): qty 2

## 2021-07-02 NOTE — Progress Notes (Signed)
PHARMACIST - PHYSICIAN COMMUNICATION   CONCERNING: Methylprednisolone IV    Current order: Methylprednisolone IV 20mg  every 12 hours      DESCRIPTION: Per [place] Protocol:   IV methylprednisolone will be converted to either a q12h or q24h frequency with the same total daily dose (TDD).  Ordered Dose: 1 to 125 mg TDD; convert to: TDD q24h.  Ordered Dose: 126 to 250 mg TDD; convert to: TDD div q12h.  Ordered Dose: >250 mg TDD; DAW.  Order has been adjusted to: Methylprednisolone IV 40mg  every 24 hours    Pernell Dupre , PharmD, BCPS Clinical Pharmacist  07/02/2021 5:13 PM

## 2021-07-02 NOTE — Progress Notes (Signed)
PHARMACY NOTE:  ANTIMICROBIAL RENAL DOSAGE ADJUSTMENT  Current antimicrobial regimen includes a mismatch between antimicrobial dosage and estimated renal function.  As per policy approved by the Pharmacy & Therapeutics and Medical Executive Committees, the antimicrobial dosage will be adjusted accordingly.  Current antimicrobial dosage:  Cefepime 2gm IVPB q 8hrs  Indication: CAP  Renal Function: Estimated Creatinine Clearance: 46.3 mL/min (A) (by C-G formula based on SCr of 1.5 mg/dL (H)).  Antimicrobial dosage has been changed to:  cefepime 2gm q 12 hours  Additional comments:   Thank you for allowing pharmacy to be a part of this patient's care.  Gaylan Fauver Rodriguez-Guzman PharmD, BCPS 07/02/2021 2:34 PM

## 2021-07-02 NOTE — Assessment & Plan Note (Signed)
Continue Protonix °

## 2021-07-02 NOTE — Progress Notes (Signed)
Progress Note   Patient: Ricardo Nash WRU:[id number] DOB: [birth year] DOA: 06/29/2021     2 DOS: the patient was seen and examined on 07/02/2021   Brief hospital course: Taken from prior notes.  76 year old Caucasian male with a past medical history significant for paroxysmal atrial fibrillation not on chronic anticoagulation, non-insulin-dependent diabetes mellitus type 2, essential hypertension, coronary artery disease on DAPT: Aspirin and Plavix, chronic LBBB.   This patient presented to the emergency department with a 2-day history of body aches, chills, cough and congestion after returning from a cruise to the [place].  He also had a fever of 102 F at home.  Also has shortness of breath.  No chest pain or lower extremity pain/swelling.  Not on oxygen at home.  On arrival he was afebrile, hypoxic on room temperature at mid 80s, initially requiring 4 L of oxygen.  COVID-19 and influenza PCR negative.  Chest x-ray with bilateral patchy infiltrate consistent with bilateral pneumonia.  Respiratory viral panel negative.  CTA was obtained due to worsening hypoxia which was negative for PE but did show multifocal pneumonia.  Currently on cefepime and Zithromax.  Sputum cultures pending. Saturating well on heated high flow at 45 L. 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  Pulmonology was consulted due to worsening hypoxia-they added low-dose steroid.   Assessment and Plan: * Acute respiratory failure with hypoxia (HCC) O2 sat 87% on room air with increased work of breathing, initially requiring 4 L of oxygen, worsening hypoxia and currently saturating on 45 L with heated high flow.  No baseline oxygen requirement.  CTA negative for PE -Supplemental O2 to keep sats over 90%-wean as tolerated -Pulmonary consult -Added low-dose steroid -Incentive spirometry and flutter  valve  Pneumonia- (present on admission) Mildly elevated procalcitonin.  Sputum cultures pending.  MRSA swab negative.  Respiratory viral panel negative.  Antibiotics were broadened to cefepime and Zithromax due to worsening hypoxia. -Continue cefepime and Zithromax -Supplemental oxygen to keep the saturation above 90% -Continue with supportive care  Paroxysmal atrial fibrillation (HCC)- (present on admission) -Continue carvedilol and aspirin -Not on any anticoagulation at home  Diabetes mellitus (HCC) CBG mildly elevated -Sliding scale insulin coverage -Add Semglee 10 units at bedtime  Hypertension- (present on admission)  Blood pressure within goal. -Continue carvedilol, amlodipine, lisinopril and hydrochlorothiazide  CAD (coronary artery disease), native coronary artery- (present on admission)  No chest pain. -Continue aspirin, atorvastatin and carvedilol and lisinopril  GERD (gastroesophageal reflux disease)- (present on admission) - Continue Protonix  Obstructive sleep apnea- (present on admission) Continue with CPAP at night which he uses at home once hypoxia improves   Subjective: Patient continued to have some cough.  No baseline oxygen use.  Wife at bedside.  Physical Exam: Vitals:   07/02/21 1246 07/02/21 1408 07/02/21 1609 07/02/21 1731  BP: (!) 129/51  (!) 130/57 (!) 131/54  Pulse: 73  77 71  Resp: 19  19 18   Temp: 97.7 F (36.5 C)  97.9 F (36.6 C) 98.1 F (36.7 C)  TempSrc:   Oral   SpO2: 99% 94% 96% 94%  Weight:      Height:       General.     In no acute distress. Pulmonary.  Lungs clear bilaterally, normal respiratory effort.  Decreased breath sound at bases CV.  Regular rate and rhythm, no JVD, rub or murmur. Abdomen.  Soft, nontender, nondistended, BS positive. CNS.  Alert and oriented x3.  No focal neurologic deficit. Extremities.  No edema, no cyanosis, pulses intact and symmetrical. Psychiatry.  Judgment and insight appears normal.  Data  Reviewed: Prior notes, labs and imaging reviewed  Family Communication: Discussed with wife at bedside  Disposition: Status is: Inpatient Remains inpatient appropriate because: Severity of illness   Planned Discharge Destination: Home  DVT prophylaxis.  Lovenox  Time spent: 50 minutes  This record has been created using Systems analyst. Errors have been sought and corrected,but may not always be located. Such creation errors do not reflect on the standard of care.  Author: Lorella Nimrod, MD 07/02/2021 6:32 PM  For on call review [email or web address].

## 2021-07-02 NOTE — Assessment & Plan Note (Signed)
Continue with CPAP at night which he uses at home once hypoxia improves

## 2021-07-02 NOTE — Hospital Course (Addendum)
Taken from prior notes.  76 year old Caucasian male with a past medical history significant for paroxysmal atrial fibrillation not on chronic anticoagulation, non-insulin-dependent diabetes mellitus type 2, essential hypertension, coronary artery disease on DAPT: Aspirin and Plavix, chronic LBBB.   This patient presented to the emergency department with a 2-day history of body aches, chills, cough and congestion after returning from a cruise to the [place].  He also had a fever of 102 F at home.  Also has shortness of breath.  No chest pain or lower extremity pain/swelling.  Not on oxygen at home.  On arrival he was afebrile, hypoxic on room temperature at mid 80s, initially requiring 4 L of oxygen.  COVID-19 and influenza PCR negative.  Chest x-ray with bilateral patchy infiltrate consistent with bilateral pneumonia.  Respiratory viral panel negative.  CTA was obtained due to worsening hypoxia which was negative for PE but did show multifocal pneumonia.  Currently on cefepime and Zithromax.  Sputum cultures pending. Saturating well on heated high flow at 45 L. 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  Pulmonology was consulted due to worsening hypoxia-they added low-dose steroid.  2/22: Breathing status remained mostly unchanged, remained on heated high flow at 40 L with FiO2 of 55% this morning.  Having bouts of coughing overnight which interfere with his sleep. Pulmonology added Lasix 40 mg twice daily today.  Procalcitonin improving, Fungitell pending.  2/23: Patient appears more comfortable.  Remained on heated high flow at 40 L.  Diuresing well with Lasix with some improvement in creatinine.  Procalcitonin improving with some worsening of CRP.  2/24: Patient remained on 40 L, FiO2 was decreased to 35%.  Clinically seems improving.  Repeat chest x-ray with persistent bilateral  infiltrate.  Pulmonology is on board-appreciate their help. Fungitell still pending.  2/25: Patient continued to improve, now on 12 L of oxygen.  Pulmonology switched lisinopril with Maxide for some added diuresis and for some concern of dry cough.  CRP trending down.  2/26: Significant improvement in oxygenation, now on 4 L of oxygen.  Pulmonology is recommending 6 more days of doxycycline, a slow taper of prednisone and continue with 20 mg of p.o. Lasix until he will see him as an outpatient in 1 week. Most likely will be going home tomorrow.

## 2021-07-02 NOTE — Consult Note (Signed)
PULMONOLOGY         Date: 07/02/2021,   MRN# [id number] [place] 1946/01/01     AdmissionWeight: 92.1 kg                 CurrentWeight: 92.1 kg   Referring physician: Dr Reesa Chew   CHIEF COMPLAINT:   Acute hypoxemic respiratory failure   HISTORY OF PRESENT ILLNESS   This is a 76 yo M with hx of PAF, dm htn came in with day history of body aches, chills, cough and congestion starting on his return from a cruise to the [place]. He also had a fever of 102. He has associated shortness of breath.  States his symptoms are similar to when he had pneumonia sometime ago.  arrival afebrile with pulse 87, BP 142/80, O2 sat 87% on room air improving to 94% on 4 L.  COVID is negative.   He had CT chest which I reviewed independently and is with findings of multifocal bilateral pneumonia.   PCCM consult for severe acute hypoxemic respiratory failure  PAST MEDICAL HISTORY   Past Medical History:  Diagnosis Date   C. difficile colitis    Coronary artery disease    Depression    Diabetes mellitus ([place])    Diverticulitis    Diverticulosis    Dysrhythmia    PAF   Environmental allergies    GERD (gastroesophageal reflux disease)    Goiter    intrathoracic, s/p benign biopsy (Dr Harlow Asa)   Hypercholesterolemia    Hypertension    Osteoarthritis    cervical spine, lumbar spine   Paroxysmal atrial fibrillation ([facility])    Pneumonia    PONV (postoperative nausea and vomiting)    Sleep apnea    CPAP @ NIGHT   Stroke [facility])    TIA (transient ischemic attack)      SURGICAL HISTORY   Past Surgical History:  Procedure Laterality Date   BACK SURGERY  8/08   s/p fusion of L4-L5   capsule endoscopy     CATARACT EXTRACTION  2011   Dr. Herbert Deaner   CERVICAL DISC SURGERY  2002   COLONOSCOPY WITH PROPOFOL N/A 07/20/2017   Procedure: COLONOSCOPY WITH PROPOFOL;  Surgeon: Manya Silvas, MD;  Location: [facility] ENDOSCOPY;  Service: Endoscopy;  Laterality:  N/A;   EYE SURGERY     cataract bilateral 10/1999   eyelid reduction     Dr. Kerby Less HERNIA REPAIR  1991   Dr, Cassell Clement LAMINECTOMY/DECOMPRESSION MICRODISCECTOMY N/A 10/15/2020   Procedure: Laminectomy - Lumbar Two-Lumbar Three - Lumbar Three-Lumbar Four with sublaminar decompression;  Surgeon: Eustace Moore, MD;  Location: [place];  Service: Neurosurgery;  Laterality: N/A;  Laminectomy - Lumbar Two-Lumbar Three - Lumbar Three-Lumbar Four with sublaminar decompression   NOSE SURGERY     turbinate reduction   SEPTOPLASTY  1975   SHOULDER SURGERY  2000   rotator cuff     FAMILY HISTORY   Family History  Problem Relation Age of Onset   Congestive Heart Failure Father    Heart disease Father        myocardial infarction   Rheumatic fever Father        valvular disease   Heart disease Mother        s/p CABG (age 46)   Kidney disease Sister    Colon cancer Neg Hx    Prostate cancer Neg Hx      SOCIAL HISTORY   Social  History   Tobacco Use   Smoking status: Former   Smokeless tobacco: Never  Vaping Use   Vaping Use: Never used  Substance Use Topics   Alcohol use: Yes    Alcohol/week: 0.0 standard drinks    Comment: occasional   Drug use: No     MEDICATIONS    Home Medication:    Current Medication:  Current Facility-Administered Medications:    acetaminophen (TYLENOL) tablet 650 mg, 650 mg, Oral, Q6H PRN, 650 mg at 07/01/21 1240 **OR** acetaminophen (TYLENOL) suppository 650 mg, 650 mg, Rectal, Q6H PRN, Athena Masse, MD   ALPRAZolam Duanne Moron) tablet 0.5 mg, 0.5 mg, Oral, TID PRN, Richarda Osmond, MD, 0.5 mg at 07/01/21 2141   amLODipine (NORVASC) tablet 10 mg, 10 mg, Oral, Daily, Athena Masse, MD, 10 mg at 07/02/21 7096   aspirin EC tablet 81 mg, 81 mg, Oral, QPM, Athena Masse, MD, 81 mg at 07/01/21 1734   atorvastatin (LIPITOR) tablet 20 mg, 20 mg, Oral, Daily, Judd Gaudier V, MD, 20 mg at 07/02/21 0956    azithromycin (ZITHROMAX) tablet 500 mg, 500 mg, Oral, QHS, Benita Gutter, RPH, 500 mg at 07/01/21 2032   carvedilol (COREG) tablet 25 mg, 25 mg, Oral, BID WC, Judd Gaudier V, MD, 25 mg at 07/02/21 0956   ceFEPIme (MAXIPIME) 2 g in sodium chloride 0.9 % 100 mL IVPB, 2 g, Intravenous, Q12H, Amin, Soundra Pilon, MD   dextromethorphan-guaiFENesin ([place] DM) 30-600 MG per 12 hr tablet 1 tablet, 1 tablet, Oral, BID PRN, Richarda Osmond, MD, 1 tablet at 06/30/21 1747   enoxaparin (LOVENOX) injection 40 mg, 40 mg, Subcutaneous, Q24H, Judd Gaudier V, MD, 40 mg at 07/02/21 0957   fluticasone (FLONASE) 50 MCG/ACT nasal spray 2 spray, 2 spray, Each Nare, Daily, Dorothe Pea, RPH, 2 spray at 07/02/21 1245   guaiFENesin (MUCINEX) 12 hr tablet 1,200 mg, 1,200 mg, Oral, BID, Anwar, Shayan S, DO, 1,200 mg at 07/02/21 0956   hydrALAZINE (APRESOLINE) injection 10 mg, 10 mg, Intravenous, Q8H PRN, Lorella Nimrod, MD   insulin aspart (novoLOG) injection 0-15 Units, 0-15 Units, Subcutaneous, TID WC, Athena Masse, MD, 2 Units at 07/02/21 1243   ipratropium-albuterol (DUONEB) 0.5-2.5 (3) MG/3ML nebulizer solution 3 mL, 3 mL, Nebulization, Q6H, Anwar, Shayan S, DO, 3 mL at 07/02/21 1408   loratadine (CLARITIN) tablet 10 mg, 10 mg, Oral, Daily, Dorothe Pea, RPH, 10 mg at 07/02/21 2836   MEDLINE mouth rinse, 15 mL, Mouth Rinse, BID, Judd Gaudier V, MD, 15 mL at 07/01/21 2036   metFORMIN (GLUCOPHAGE) tablet 1,000 mg, 1,000 mg, Oral, BID WC, Doristine Mango L, MD, 1,000 mg at 07/02/21 0956   montelukast (SINGULAIR) tablet 10 mg, 10 mg, Oral, QHS, Doristine Mango L, MD, 10 mg at 07/01/21 2032   ondansetron (ZOFRAN) tablet 4 mg, 4 mg, Oral, Q6H PRN **OR** ondansetron (ZOFRAN) injection 4 mg, 4 mg, Intravenous, Q6H PRN, Judd Gaudier V, MD, 4 mg at 06/30/21 1112   pantoprazole (PROTONIX) EC tablet 40 mg, 40 mg, Oral, Daily, Doristine Mango L, MD, 40 mg at 07/02/21 0956   pioglitazone (ACTOS)  tablet 15 mg, 15 mg, Oral, Daily, Doristine Mango L, MD, 15 mg at 07/02/21 0956   sertraline (ZOLOFT) tablet 100 mg, 100 mg, Oral, Daily, Doristine Mango L, MD, 100 mg at 07/02/21 0956   tamsulosin (FLOMAX) capsule 0.4 mg, 0.4 mg, Oral, Q breakfast, Richarda Osmond, MD, 0.4 mg at 07/02/21 0956   traZODone (DESYREL) tablet  75 mg, 75 mg, Oral, QHS, Richarda Osmond, MD, 75 mg at 07/01/21 2032    ALLERGIES   Penicillins     REVIEW OF SYSTEMS    Review of Systems:  Gen:  Denies  fever, sweats, chills weigh loss  HEENT: Denies blurred vision, double vision, ear pain, eye pain, hearing loss, nose bleeds, sore throat Cardiac:  No dizziness, chest pain or heaviness, chest tightness,edema Resp:   Denies cough or sputum porduction, shortness of breath,wheezing, hemoptysis,  Gi: Denies swallowing difficulty, stomach pain, nausea or vomiting, diarrhea, constipation, bowel incontinence Gu:  Denies bladder incontinence, burning urine Ext:   Denies Joint pain, stiffness or swelling Skin: Denies  skin rash, easy bruising or bleeding or hives Endoc:  Denies polyuria, polydipsia , polyphagia or weight change Psych:   Denies depression, insomnia or hallucinations   Other:  All other systems negative   VS: BP (!) 130/57 (BP Location: Right Arm)    Pulse 77    Temp 97.9 F (36.6 C) (Oral)    Resp 19    Ht 5\' 9"  (1.753 m)    Wt 92.1 kg    SpO2 96%    BMI 29.98 kg/m      PHYSICAL EXAM    GENERAL:NAD, no fevers, chills, no weakness no fatigue HEAD: Normocephalic, atraumatic.  EYES: Pupils equal, round, reactive to light. Extraocular muscles intact. No scleral icterus.  MOUTH: Moist mucosal membrane. Dentition intact. No abscess noted.  EAR, NOSE, THROAT: Clear without exudates. No external lesions.  NECK: Supple. No thyromegaly. No nodules. No JVD.  PULMONARY: bilateral rhonchi  CARDIOVASCULAR: S1 and S2. Regular rate and rhythm. No murmurs, rubs, or gallops. No edema. Pedal  pulses 2+ bilaterally.  GASTROINTESTINAL: Soft, nontender, nondistended. No masses. Positive bowel sounds. No hepatosplenomegaly.  MUSCULOSKELETAL: No swelling, clubbing, or edema. Range of motion full in all extremities.  NEUROLOGIC: Cranial nerves II through XII are intact. No gross focal neurological deficits. Sensation intact. Reflexes intact.  SKIN: No ulceration, lesions, rashes, or cyanosis. Skin warm and dry. Turgor intact.  PSYCHIATRIC: Mood, affect within normal limits. The patient is awake, alert and oriented x 3. Insight, judgment intact.       IMAGING    DG Chest 2 View  Result Date: 06/29/2021 CLINICAL DATA:  Shortness of breath. Chest congestion, cough and body aches. EXAM: CHEST - 2 VIEW COMPARISON:  10/11/2020 FINDINGS: Heart size upper limits of normal. Aortic atherosclerosis as seen previously. Bronchial thickening with patchy infiltrates in both lower lobes consistent with bronchopneumonia. No effusion. No lobar consolidation or collapse. Chronic compression deformities of the thoracolumbar junction region as seen previously. IMPRESSION: Bilateral lower lobe bronchitis and patchy bronchopneumonia. Electronically Signed   By: Nelson Chimes M.D.   On: 06/29/2021 22:25   CT Angio Chest Pulmonary Embolism (PE) W or WO Contrast  Result Date: 07/01/2021 CLINICAL DATA:  Pulmonary embolism (PE) suspected, unknown D-dimer EXAM: CT ANGIOGRAPHY CHEST WITH CONTRAST TECHNIQUE: Multidetector CT imaging of the chest was performed using the standard protocol during bolus administration of intravenous contrast. Multiplanar CT image reconstructions and MIPs were obtained to evaluate the vascular anatomy. RADIATION DOSE REDUCTION: This exam was performed according to the departmental dose-optimization program which includes automated exposure control, adjustment of the mA and/or kV according to patient size and/or use of iterative reconstruction technique. CONTRAST:  40mL OMNIPAQUE IOHEXOL 350  MG/ML SOLN COMPARISON:  Radiographs 07/01/2021, 06/29/2021, 10/11/2020 FINDINGS: Cardiovascular: Respiratory motion artifact limits evaluation of the segmental and subsegmental vessels. There is  no evidence of pulmonary embolism to the level of the lobar arteries. Mild cardiomegaly. No pericardial disease. Coronary artery calcifications. Mild atherosclerotic calcifications of the aortic arch and descending aorta. Mediastinum/Nodes: There are a few prominent mediastinal and hilar lymph nodes, likely reactive. Enlarged, multinodular goiter with mediastinal extension, increased in size in comparison due prior chest CT and March 2012. Lungs/Pleura: High there is multifocal consolidation and ground-glass opacities bilaterally, most confluent in the lower lobes and right middle lobe into a slightly lesser degree in the upper lobes. Small bilateral pleural effusions. No pneumothorax. Upper Abdomen: Airways are patent. Musculoskeletal: Chronic anterior compression fracture of T12. No acute osseous abnormality. Diffuse idiopathic skeletal hyperostosis. Review of the MIP images confirms the above findings. IMPRESSION: Multifocal pneumonia.  Small bilateral pleural effusions. Respiratory motion artifact limits evaluation of the segmental and subsegmental pulmonary arteries. Within this limitation there is no evidence of pulmonary embolism to the level of the lobar arteries. Multinodular goiter with intrathoracic extension, increased in size in comparison to prior chest CT in March 2012. Aortic Atherosclerosis (ICD10-I70.0). Electronically Signed   By: Maurine Simmering M.D.   On: 07/01/2021 10:04   DG Chest Port 1 View  Result Date: 07/01/2021 CLINICAL DATA:  Dyspnea. EXAM: PORTABLE CHEST 1 VIEW COMPARISON:  06/29/2021 FINDINGS: Heart size is normal. Interval development of bilateral LOWER lobe infiltrates a RIGHT slightly more confluent than the LEFT. IMPRESSION: New bilateral LOWER lobe infiltrates. Electronically Signed   By:  Nolon Nations M.D.   On: 07/01/2021 08:55   ECHOCARDIOGRAM COMPLETE  Result Date: 07/02/2021    ECHOCARDIOGRAM REPORT   Patient Name:   Ricardo Nash Date of Exam: 07/01/2021 Medical Rec #:  [id number]          Height:       69.0 in Accession #:    [id number]         Weight:       203.0 lb Date of Birth:  06-23-1945          BSA:          2.079 m Patient Age:    78 years           BP:           155/60 mmHg Patient Gender: M                  HR:           82 bpm. Exam Location:  [facility] Procedure: 2D Echo, Color Doppler and Cardiac Doppler Indications:     R06.03 Acute respiratory distress  History:         Patient has no prior history of Echocardiogram examinations.                  CAD, Stroke; Risk Factors:Hypertension, Sleep Apnea, HCL and                  Diabetes.  Sonographer:     Charmayne Sheer Referring Phys:  [id number] Richarda Osmond Diagnosing Phys: Yolonda Kida MD IMPRESSIONS  1. Left ventricular ejection fraction, by estimation, is 40 to 45%. The left ventricle has mildly decreased function. The left ventricle demonstrates global hypokinesis. The left ventricular internal cavity size was mildly to moderately dilated. Left ventricular diastolic parameters were normal.  2. Right ventricular systolic function is low normal. The right ventricular size is mildly enlarged. Mildly increased right ventricular wall thickness.  3. The mitral valve is grossly normal. Trivial mitral valve  regurgitation.  4. The aortic valve is grossly normal. Aortic valve regurgitation is not visualized. FINDINGS  Left Ventricle: Left ventricular ejection fraction, by estimation, is 40 to 45%. The left ventricle has mildly decreased function. The left ventricle demonstrates global hypokinesis. The left ventricular internal cavity size was mildly to moderately dilated. There is no left ventricular hypertrophy. Abnormal (paradoxical) septal motion, consistent with left bundle branch block. Left ventricular diastolic  parameters were normal. Right Ventricle: The right ventricular size is mildly enlarged. Mildly increased right ventricular wall thickness. Right ventricular systolic function is low normal. Left Atrium: Left atrial size was normal in size. Right Atrium: Right atrial size was normal in size. Pericardium: There is no evidence of pericardial effusion. Mitral Valve: The mitral valve is grossly normal. Trivial mitral valve regurgitation. MV peak gradient, 4.5 mmHg. The mean mitral valve gradient is 3.0 mmHg. Tricuspid Valve: The tricuspid valve is grossly normal. Tricuspid valve regurgitation is trivial. Aortic Valve: The aortic valve is grossly normal. Aortic valve regurgitation is not visualized. Aortic valve mean gradient measures 5.0 mmHg. Aortic valve peak gradient measures 9.4 mmHg. Aortic valve area, by VTI measures 1.95 cm. Pulmonic Valve: The pulmonic valve was normal in structure. Pulmonic valve regurgitation is not visualized. Aorta: The ascending aorta was not well visualized. IAS/Shunts: No atrial level shunt detected by color flow Doppler.  LEFT VENTRICLE PLAX 2D LVIDd:         5.17 cm   Diastology LVIDs:         3.99 cm   LV e' medial:    6.20 cm/s LV PW:         0.98 cm   LV E/e' medial:  14.6 LV IVS:        0.87 cm   LV e' lateral:   6.96 cm/s LVOT diam:     2.00 cm   LV E/e' lateral: 13.0 LV SV:         54 LV SV Index:   26 LVOT Area:     3.14 cm  RIGHT VENTRICLE RV Basal diam:  3.54 cm LEFT ATRIUM             Index        RIGHT ATRIUM           Index LA diam:        4.50 cm 2.16 cm/m   RA Area:     11.20 cm LA Vol (A2C):   28.3 ml 13.61 ml/m  RA Volume:   19.20 ml  9.23 ml/m LA Vol (A4C):   67.0 ml 32.23 ml/m LA Biplane Vol: 46.6 ml 22.41 ml/m  AORTIC VALVE                    PULMONIC VALVE AV Area (Vmax):    1.90 cm     PV Vmax:          1.27 m/s AV Area (Vmean):   2.04 cm     PV Vmean:         82.700 cm/s AV Area (VTI):     1.95 cm     PV VTI:           0.208 m AV Vmax:           153.00  cm/s  PV Peak grad:     6.5 mmHg AV Vmean:          97.400 cm/s  PV Mean grad:     3.0 mmHg AV VTI:  0.275 m      PR End Diast Vel: 3.05 msec AV Peak Grad:      9.4 mmHg AV Mean Grad:      5.0 mmHg LVOT Vmax:         92.70 cm/s LVOT Vmean:        63.200 cm/s LVOT VTI:          0.171 m LVOT/AV VTI ratio: 0.62  AORTA Ao Root diam: 3.10 cm MITRAL VALVE MV Area (PHT): 4.86 cm    SHUNTS MV Area VTI:   1.77 cm    Systemic VTI:  0.17 m MV Peak grad:  4.5 mmHg    Systemic Diam: 2.00 cm MV Mean grad:  3.0 mmHg MV Vmax:       1.06 m/s MV Vmean:      80.7 cm/s MV Decel Time: 156 msec MV E velocity: 90.80 cm/s MV A velocity: 90.80 cm/s MV E/A ratio:  1.00 Dwayne Prince Rome MD Electronically signed by Yolonda Kida MD Signature Date/Time: 07/02/2021/3:46:32 PM    Final       ASSESSMENT/PLAN   Acute hypoxemic respiratory failure.   Patienthas multifocal pneumonia  - RVP is negative  - he shares recurrent pneumonia   - denies mold or fungal exposure or history   - Rocephin and zithromax    - started BID solumedrol 20 BID IV  -Procalcitonin is mildly elevated continue antibiotics  -MRSA PCR is negative   Acute on chronic systolic CHF S/p TTE with global hypokinesis     Thank you for allowing me to participate in the care of this patient.   Patient/Family are satisfied with care plan and all questions have been answered.  This document was prepared using Dragon voice recognition software and may include unintentional dictation errors.     Ottie Glazier, M.D.  Division of [facility]

## 2021-07-02 NOTE — Evaluation (Addendum)
Clinical/Bedside Swallow Evaluation Patient Details  Name: Jovann Luse MRN: [id number] Date of Birth: 05-11-1946  Today's Date: 07/02/2021 Time: SLP Start Time (ACUTE ONLY): 88 SLP Stop Time (ACUTE ONLY): 1200 SLP Time Calculation (min) (ACUTE ONLY): 60 min  Past Medical History:  Past Medical History:  Diagnosis Date   C. difficile colitis    Coronary artery disease    Depression    Diabetes mellitus ([place])    Diverticulitis    Diverticulosis    Dysrhythmia    PAF   Environmental allergies    GERD (gastroesophageal reflux disease)    Goiter    intrathoracic, s/p benign biopsy (Dr Harlow Asa)   Hypercholesterolemia    Hypertension    Osteoarthritis    cervical spine, lumbar spine   Paroxysmal atrial fibrillation ([facility])    Pneumonia    PONV (postoperative nausea and vomiting)    Sleep apnea    CPAP @ NIGHT   Stroke [facility])    TIA (transient ischemic attack)    Past Surgical History:  Past Surgical History:  Procedure Laterality Date   BACK SURGERY  8/08   s/p fusion of L4-L5   capsule endoscopy     CATARACT EXTRACTION  2011   Dr. Herbert Deaner   CERVICAL DISC SURGERY  2002   COLONOSCOPY WITH PROPOFOL N/A 07/20/2017   Procedure: COLONOSCOPY WITH PROPOFOL;  Surgeon: Manya Silvas, MD;  Location: [facility] ENDOSCOPY;  Service: Endoscopy;  Laterality: N/A;   EYE SURGERY     cataract bilateral 10/1999   eyelid reduction     Dr. Kerby Less HERNIA REPAIR  1991   Dr, Cassell Clement LAMINECTOMY/DECOMPRESSION MICRODISCECTOMY N/A 10/15/2020   Procedure: Laminectomy - Lumbar Two-Lumbar Three - Lumbar Three-Lumbar Four with sublaminar decompression;  Surgeon: Eustace Moore, MD;  Location: [place];  Service: Neurosurgery;  Laterality: N/A;  Laminectomy - Lumbar Two-Lumbar Three - Lumbar Three-Lumbar Four with sublaminar decompression   NOSE SURGERY     turbinate reduction   SEPTOPLASTY  1975   SHOULDER SURGERY  2000   rotator cuff   HPI:  Pt is a 76 year old Caucasian male with  a past medical history including GERD, diverticulitis, diverticulosis, pneumonia, stroke/TIA, goiter, Bell's Palsy x2(the most recent in 09/2020), depression, paroxysmal atrial fibrillation not on chronic anticoagulation, non-insulin-dependent diabetes mellitus type 2, essential hypertension, coronary artery disease on DAPT: Aspirin and Plavix, chronic LBBB.     This patient presented to the emergency department with a 2-day history of body aches, chills, cough and congestion after returning from a cruise to the [place].  He also had a fever of 102 F at home.  Also has shortness of breath.  No chest pain or lower extremity pain/swelling.  Not on oxygen at home.  Initial CXR showed bilateral lower lobe bronchitis and patchy bronchopneumonia   Patient started on Rocephin and azithromycin in ED.  Respiratory status worsened then placed on high flow nasal cannula 15L.  Not in respiratory distress and appears comfortable now on 15L per MD notes.  CT Angio of Chest: "Multifocal pneumonia.  Small bilateral pleural effusions.  Respiratory motion artifact limits evaluation of the segmental and  subsegmental pulmonary arteries.".  Baseline Cough present.    Assessment / Plan / Recommendation  Clinical Impression  Pt seen for BSE today; Wife at bedside. Both pt and Wife denied pt having any recent swallowing problems (while on cruise, at home) and that he had been eating/drinking a regular diet, thin liquids. Pt on HFNC  15L, 40%. Pt noted to have a mostly nonproductive cough intermittently Prior to po's; Wife and pt stated the cough is ongoing at Baseline since becoming sick. He is most bothered by it when talking more.  Pt appears to present w/ grossly adequate oropharyngeal phase swallow function w/ No immediate, overt oropharyngeal phase dysphagia noted, no overt neuromuscular deficits noted. Pt consumed po trials w/ no immediate clinical s/s of aspiration during po trials; mild cough noted inconsistently b/t trials as  was noted at Baseline during conversation and OMEs. Pt stated frequently "i just had to cough b/c of the phlegm" and indicated a tickle was felt. He and Wife also endorsed he coughed more when talking more; they suspected dryness. Pt does have a h/o Bell's Palsy from 09/2020 w/ R labial decreased tone, but he denied any swallowing issues; Wife agreed. Stated he was contemplating "botox" possibly and has been followed by Speech therapy services along w/ his MD.     Pt appears at reduced risk for aspiration following general aspiration precautions and taking rest breaks during oral intake d/t decline in current Pulmonary status.   During po trials, pt consumed all consistencies w/ no immedidate coughing, decline in vocal quality, or change in respiratory presentation during/post trials. O2 sats remained ~96-97%. Oral phase appeared [facility] w/ timely bolus management, mastication, and control of bolus propulsion for A-P transfer for swallowing. Oral clearing achieved w/ all trial consistencies. Pt took his time w/ all oral intake not to put too much in his mouth too fast. Encouraged pt to refrain from talking w/ food in mouth and immediately following sips. Rest breaks were encouraged to reduce any WOB from exertion of po tasks. OM Exam c/b Baseline mild R labial decreased tone from Bell's Palsy - unchanged in several months. Speech Clear. Pt fed self w/ setup support.   Recommend a Regular consistency diet w/ well-Cut meats, moistened foods; Thin liquids VIA CUP -- less straw use d/t pulmonary status currently. Recommend general aspiration precautions, REFLUX precautions d/t Baseline GERD, Pills WHOLE in Puree for safer, easier swallowing as pt described Larger pills causing difficulty to swallow. Education given on Pills in Puree; food consistencies and easy to eat options; general aspiration and REFLUX precautions. Pt described GLOBUS sensations when eating "certain foods", this included bread. Encouraged him to f/u  w/ his GI re: Reflux/GERD and Esophageal motility. NSG to reconsult if any new needs arise. NSG agreed. MD updated. SLP Visit Diagnosis: Dysphagia, unspecified (R13.10)    Aspiration Risk  Mild aspiration risk;Risk for inadequate nutrition/hydration (reduced following general precautions)    Diet Recommendation   Regular consistency diet w/ well-Cut meats, moistened foods; Thin liquids VIA CUP -- no straw use d/t pulmonary status currently. Recommend general aspiration precautions, REFLUX precautions d/t Baseline GERD. Rest Breaks during oral intake to lessen any WOB. No talking during oral intake/meals.  Medication Administration: Whole meds with puree (for easier, safer swallowing)    Other  Recommendations Recommended Consults:  (GI -- management of GERD) Oral Care Recommendations: Oral care BID;Oral care before and after PO;Patient independent with oral care Other Recommendations:  (n/a)    Recommendations for follow up therapy are one component of a multi-disciplinary discharge planning process, led by the attending physician.  Recommendations may be updated based on patient status, additional functional criteria and insurance authorization.  Follow up Recommendations No SLP follow up (TBD)      Assistance Recommended at Discharge None  Functional Status Assessment Patient has had a recent  decline in their functional status and demonstrates the ability to make significant improvements in function in a reasonable and predictable amount of time.  Frequency and Duration min 1 x/week  1 week       Prognosis Prognosis for Safe Diet Advancement: Good Barriers to Reach Goals: Time post onset;Severity of deficits Barriers/Prognosis Comment: pulmonary status      Swallow Study   General Date of Onset: 06/29/21 HPI: Pt is a 76 year old Caucasian male with a past medical history including GERD, diverticulitis, diverticulosis, pneumonia, stroke/TIA, goiter, Bell's Palsy, depression,  paroxysmal atrial fibrillation not on chronic anticoagulation, non-insulin-dependent diabetes mellitus type 2, essential hypertension, coronary artery disease on DAPT: Aspirin and Plavix, chronic LBBB.     This patient presented to the emergency department with a 2-day history of body aches, chills, cough and congestion after returning from a cruise to the [place].  He also had a fever of 102 F at home.  Also has shortness of breath.  No chest pain or lower extremity pain/swelling.  Not on oxygen at home.  Initial CXR showed bilateral lower lobe bronchitis and patchy bronchopneumonia   Patient started on Rocephin and azithromycin in ED.  Respiratory status worsened then placed on high flow nasal cannula 15L.  Not in respiratory distress and appears comfortable now on 15L per MD notes.  CT Angio of Chest: "Multifocal pneumonia.  Small bilateral pleural effusions.  Respiratory motion artifact limits evaluation of the segmental and  subsegmental pulmonary arteries.".  Baseline Cough present. Type of Study: Bedside Swallow Evaluation Previous Swallow Assessment: none Diet Prior to this Study: Regular;Thin liquids Temperature Spikes Noted: No (wbc 12.3) Respiratory Status: Nasal cannula;Increased respiratory rate (HFNC 15L; 40%) History of Recent Intubation: No Behavior/Cognition: Alert;Cooperative;Pleasant mood Oral Cavity Assessment: Within Functional Limits Oral Care Completed by SLP: Recent completion by staff Oral Cavity - Dentition: Adequate natural dentition Vision: Functional for self-feeding Self-Feeding Abilities: Able to feed self Patient Positioning: Upright in bed (positioning support more upright and forward in bed) Baseline Vocal Quality: Normal (min WOB w/ exertion of talking) Volitional Cough: Strong (slight phlegm) Volitional Swallow: Able to elicit    Oral/Motor/Sensory Function Overall Oral Motor/Sensory Function: Within functional limits   Ice Chips Ice chips: Within functional  limits Presentation: Spoon (fed; 2 trials)   Thin Liquid Thin Liquid: Within functional limits (inconsistent dry cough b/t trials as at baseline) Presentation: Cup;Self Fed (12+ trials) Other Comments: ~8 ozs consumed    Nectar Thick Nectar Thick Liquid: Within functional limits Presentation: Cup;Self Fed (~2 ozs) Other Comments: mild cough at end   Honey Thick Honey Thick Liquid: Not tested   Puree Puree: Within functional limits Presentation: Self Fed;Spoon (10 trials) Other Comments: mild cough intermittently x2   Solid     Solid: Within functional limits Presentation: Self Fed;Spoon (4 trials) Other Comments: intermittent cough x1-2        Orinda Kenner, MS, CCC-SLP Speech Language Pathologist Rehab Services; [place] [phone] (ascom) Jaise Moser 07/02/2021,4:48 PM

## 2021-07-03 LAB — BASIC METABOLIC PANEL
Anion gap: 12 (ref 5–15)
BUN: 49 mg/dL — ABNORMAL HIGH (ref 8–23)
CO2: 27 mmol/L (ref 22–32)
Calcium: 8.4 mg/dL — ABNORMAL LOW (ref 8.9–10.3)
Chloride: 98 mmol/L (ref 98–111)
Creatinine, Ser: 1.31 mg/dL — ABNORMAL HIGH (ref 0.61–1.24)
GFR, Estimated: 56 mL/min — ABNORMAL LOW (ref >60)
Glucose, Bld: 150 mg/dL — ABNORMAL HIGH (ref 70–99)
Potassium: 4.1 mmol/L (ref 3.5–5.1)
Sodium: 137 mmol/L (ref 135–145)

## 2021-07-03 LAB — CULTURE, RESPIRATORY W GRAM STAIN: Culture: NORMAL

## 2021-07-03 LAB — GLUCOSE, CAPILLARY
Glucose-Capillary: 146 mg/dL — ABNORMAL HIGH (ref 70–99)
Glucose-Capillary: 124 mg/dL — ABNORMAL HIGH (ref 70–99)
Glucose-Capillary: 148 mg/dL — ABNORMAL HIGH (ref 70–99)

## 2021-07-03 LAB — PROCALCITONIN: Procalcitonin: 0.2 ng/mL

## 2021-07-03 LAB — C-REACTIVE PROTEIN: CRP: 10.2 mg/dL — ABNORMAL HIGH (ref <1.0)

## 2021-07-03 MED ORDER — FUROSEMIDE 10 MG/ML IJ SOLN
40.0000 mg | Freq: Two times a day (BID) | INTRAMUSCULAR | Status: DC
  Administered 2021-07-03 – 2021-07-05 (×4): 40 mg via INTRAVENOUS
  Filled 2021-07-03 (×4): qty 4

## 2021-07-03 MED ORDER — ALBUTEROL SULFATE (2.5 MG/3ML) 0.083% IN NEBU
2.5000 mg | INHALATION_SOLUTION | RESPIRATORY_TRACT | Status: DC | PRN
  Administered 2021-07-05: 2.5 mg via RESPIRATORY_TRACT
  Filled 2021-07-03: qty 3

## 2021-07-03 MED ORDER — DIPHENHYDRAMINE HCL 12.5 MG/5ML PO ELIX
25.0000 mg | ORAL_SOLUTION | ORAL | Status: DC | PRN
  Administered 2021-07-03: 25 mg via ORAL
  Filled 2021-07-03 (×2): qty 10

## 2021-07-03 MED ORDER — MENTHOL 3 MG MT LOZG
1.0000 | LOZENGE | OROMUCOSAL | Status: DC | PRN
  Administered 2021-07-04 – 2021-07-05 (×3): 3 mg via ORAL
  Filled 2021-07-03 (×3): qty 9

## 2021-07-03 MED ORDER — INSULIN GLARGINE-YFGN 100 UNIT/ML ~~LOC~~ SOLN
10.0000 [IU] | Freq: Every day | SUBCUTANEOUS | Status: DC
  Administered 2021-07-03 – 2021-07-07 (×4): 10 [IU] via SUBCUTANEOUS
  Filled 2021-07-03 (×6): qty 0.1

## 2021-07-03 MED ORDER — HYDROCOD POLI-CHLORPHE POLI ER 10-8 MG/5ML PO SUER
5.0000 mL | Freq: Two times a day (BID) | ORAL | Status: DC | PRN
Start: 2021-07-03 — End: 2021-07-08
  Administered 2021-07-03 – 2021-07-07 (×6): 5 mL via ORAL
  Filled 2021-07-03 (×6): qty 5

## 2021-07-03 MED ORDER — BROMPHENIRAMINE-PHENYLEPHRINE 2-5 MG/10ML PO LIQD: 10.0000 mL | ORAL | Status: DC | PRN

## 2021-07-03 MED ORDER — ALBUTEROL SULFATE (2.5 MG/3ML) 0.083% IN NEBU
INHALATION_SOLUTION | RESPIRATORY_TRACT | Status: AC
  Administered 2021-07-03: 2.5 mg via RESPIRATORY_TRACT
  Filled 2021-07-03: qty 3

## 2021-07-03 NOTE — Progress Notes (Signed)
PULMONOLOGY         Date: 07/03/2021,   MRN# [id number] Ricardo Nash [facility] 1945/12/05     AdmissionWeight: 92.1 kg                 CurrentWeight: 92.2 kg   Referring physician: Dr Reesa Chew   CHIEF COMPLAINT:   Acute hypoxemic respiratory failure   HISTORY OF PRESENT ILLNESS   This is a 76 yo M with hx of PAF, dm htn came in with day history of body aches, chills, cough and congestion starting on his return from a cruise to the [place]. He also had a fever of 102. He has associated shortness of breath.  States his symptoms are similar to when he had pneumonia sometime ago.  arrival afebrile with pulse 87, BP 142/80, O2 sat 87% on room air improving to 94% on 4 L.  COVID is negative.   He had CT chest which I reviewed independently and is with findings of multifocal bilateral pneumonia.   PCCM consult for severe acute hypoxemic respiratory failure  07/03/21-  patient is on 60L/min HFNC with 45L/min, CRP is trending down with steroids. PCT is trending down with antibiotics.  He is no better today clinically.  He is reciving narcotics and xanax which may hinder his respiratory status and contribute to shallow breathing.   PAST MEDICAL HISTORY   Past Medical History:  Diagnosis Date   C. difficile colitis    Coronary artery disease    Depression    Diabetes mellitus ([place])    Diverticulitis    Diverticulosis    Dysrhythmia    PAF   Environmental allergies    GERD (gastroesophageal reflux disease)    Goiter    intrathoracic, s/p benign biopsy (Dr Harlow Asa)   Hypercholesterolemia    Hypertension    Osteoarthritis    cervical spine, lumbar spine   Paroxysmal atrial fibrillation (HCC)    Pneumonia    PONV (postoperative nausea and vomiting)    Sleep apnea    CPAP @ NIGHT   Stroke [facility])    TIA (transient ischemic attack)      SURGICAL HISTORY   Past Surgical History:  Procedure Laterality Date   BACK SURGERY  8/08   s/p fusion of L4-L5    capsule endoscopy     CATARACT EXTRACTION  2011   Dr. Herbert Deaner   CERVICAL DISC SURGERY  2002   COLONOSCOPY WITH PROPOFOL N/A 07/20/2017   Procedure: COLONOSCOPY WITH PROPOFOL;  Surgeon: Manya Silvas, MD;  Location: [facility] ENDOSCOPY;  Service: Endoscopy;  Laterality: N/A;   EYE SURGERY     cataract bilateral 10/1999   eyelid reduction     Dr. Kerby Less HERNIA REPAIR  1991   Dr, Cassell Clement LAMINECTOMY/DECOMPRESSION MICRODISCECTOMY N/A 10/15/2020   Procedure: Laminectomy - Lumbar Two-Lumbar Three - Lumbar Three-Lumbar Four with sublaminar decompression;  Surgeon: Eustace Moore, MD;  Location: [place];  Service: Neurosurgery;  Laterality: N/A;  Laminectomy - Lumbar Two-Lumbar Three - Lumbar Three-Lumbar Four with sublaminar decompression   NOSE SURGERY     turbinate reduction   SEPTOPLASTY  1975   SHOULDER SURGERY  2000   rotator cuff     FAMILY HISTORY   Family History  Problem Relation Age of Onset   Congestive Heart Failure Father    Heart disease Father        myocardial infarction   Rheumatic fever Father  valvular disease   Heart disease Mother        s/p CABG (age 66)   Kidney disease Sister    Colon cancer Neg Hx    Prostate cancer Neg Hx      SOCIAL HISTORY   Social History   Tobacco Use   Smoking status: Former   Smokeless tobacco: Never  Scientific laboratory technician Use: Never used  Substance Use Topics   Alcohol use: Yes    Alcohol/week: 0.0 standard drinks    Comment: occasional   Drug use: No     MEDICATIONS    Home Medication:    Current Medication:  Current Facility-Administered Medications:    acetaminophen (TYLENOL) tablet 650 mg, 650 mg, Oral, Q6H PRN, 650 mg at 07/03/21 0817 **OR** acetaminophen (TYLENOL) suppository 650 mg, 650 mg, Rectal, Q6H PRN, Athena Masse, MD   albuterol (PROVENTIL) (2.5 MG/3ML) 0.083% nebulizer solution 2.5 mg, 2.5 mg, Nebulization, Q2H PRN, Foust, Katy L, NP, 2.5 mg at 07/03/21  0452   ALPRAZolam (XANAX) tablet 0.5 mg, 0.5 mg, Oral, TID PRN, Renda Rolls, RPH, 0.5 mg at 07/03/21 2751   aspirin EC tablet 81 mg, 81 mg, Oral, QPM, Athena Masse, MD, 81 mg at 07/02/21 1801   atorvastatin (LIPITOR) tablet 20 mg, 20 mg, Oral, Daily, Judd Gaudier V, MD, 20 mg at 07/03/21 7001   azithromycin (ZITHROMAX) tablet 500 mg, 500 mg, Oral, QHS, Benita Gutter, RPH, 500 mg at 07/02/21 2130   carvedilol (COREG) tablet 25 mg, 25 mg, Oral, BID WC, Judd Gaudier V, MD, 25 mg at 07/03/21 7494   ceFEPIme (MAXIPIME) 2 g in sodium chloride 0.9 % 100 mL IVPB, 2 g, Intravenous, Q12H, Lorella Nimrod, MD, Last Rate: 200 mL/hr at 07/03/21 0830, 2 g at 07/03/21 0830   chlorpheniramine-HYDROcodone 10-8 MG/5ML suspension 5 mL, 5 mL, Oral, Q12H PRN, Lorella Nimrod, MD   dextromethorphan-guaiFENesin ([place] DM) 30-600 MG per 12 hr tablet 1 tablet, 1 tablet, Oral, BID PRN, Richarda Osmond, MD, 1 tablet at 07/03/21 0502   diphenhydrAMINE (BENADRYL) 12.5 MG/5ML elixir 25 mg, 25 mg, Oral, Q4H PRN, Renda Rolls, RPH, 25 mg at 07/03/21 0817   enoxaparin (LOVENOX) injection 40 mg, 40 mg, Subcutaneous, Q24H, Judd Gaudier V, MD, 40 mg at 07/03/21 0805   fluticasone (FLONASE) 50 MCG/ACT nasal spray 2 spray, 2 spray, Each Nare, Daily, Dorothe Pea, RPH, 2 spray at 07/03/21 4967   furosemide (LASIX) injection 20 mg, 20 mg, Intravenous, BID, Ottie Glazier, MD, 20 mg at 07/03/21 0806   guaiFENesin (MUCINEX) 12 hr tablet 1,200 mg, 1,200 mg, Oral, BID, Anwar, Shayan S, DO, 1,200 mg at 07/03/21 5916   hydrALAZINE (APRESOLINE) injection 10 mg, 10 mg, Intravenous, Q8H PRN, Lorella Nimrod, MD   insulin aspart (novoLOG) injection 0-15 Units, 0-15 Units, Subcutaneous, TID WC, Judd Gaudier V, MD, 2 Units at 07/03/21 1144   ipratropium-albuterol (DUONEB) 0.5-2.5 (3) MG/3ML nebulizer solution 3 mL, 3 mL, Nebulization, Q6H, Anwar, Shayan S, DO, 3 mL at 07/03/21 0806   loratadine (CLARITIN) tablet  10 mg, 10 mg, Oral, Daily, Dorothe Pea, RPH, 10 mg at 07/03/21 3846   MEDLINE mouth rinse, 15 mL, Mouth Rinse, BID, Judd Gaudier V, MD, 15 mL at 07/03/21 6599   metFORMIN (GLUCOPHAGE) tablet 1,000 mg, 1,000 mg, Oral, BID WC, Doristine Mango L, MD, 1,000 mg at 07/03/21 0807   montelukast (SINGULAIR) tablet 10 mg, 10 mg, Oral, QHS, Richarda Osmond, MD, 10 mg  at 07/02/21 2131   ondansetron (ZOFRAN) tablet 4 mg, 4 mg, Oral, Q6H PRN **OR** ondansetron (ZOFRAN) injection 4 mg, 4 mg, Intravenous, Q6H PRN, Athena Masse, MD, 4 mg at 06/30/21 1112   pantoprazole (PROTONIX) EC tablet 40 mg, 40 mg, Oral, Daily, Richarda Osmond, MD, 40 mg at 07/03/21 1478   pioglitazone (ACTOS) tablet 15 mg, 15 mg, Oral, Daily, Richarda Osmond, MD, 15 mg at 07/03/21 2956   sertraline (ZOLOFT) tablet 100 mg, 100 mg, Oral, Daily, Richarda Osmond, MD, 100 mg at 07/03/21 2130   tamsulosin (FLOMAX) capsule 0.4 mg, 0.4 mg, Oral, Q breakfast, Richarda Osmond, MD, 0.4 mg at 07/03/21 8657   traZODone (DESYREL) tablet 75 mg, 75 mg, Oral, QHS, Richarda Osmond, MD, 75 mg at 07/02/21 2131    ALLERGIES   Penicillins     REVIEW OF SYSTEMS    Review of Systems:  Gen:  Denies  fever, sweats, chills weigh loss  HEENT: Denies blurred vision, double vision, ear pain, eye pain, hearing loss, nose bleeds, sore throat Cardiac:  No dizziness, chest pain or heaviness, chest tightness,edema Resp:   Denies cough or sputum porduction, shortness of breath,wheezing, hemoptysis,  Gi: Denies swallowing difficulty, stomach pain, nausea or vomiting, diarrhea, constipation, bowel incontinence Gu:  Denies bladder incontinence, burning urine Ext:   Denies Joint pain, stiffness or swelling Skin: Denies  skin rash, easy bruising or bleeding or hives Endoc:  Denies polyuria, polydipsia , polyphagia or weight change Psych:   Denies depression, insomnia or hallucinations   Other:  All other systems  negative   VS: BP (!) 144/60 (BP Location: Right Arm)    Pulse 79    Temp 98.7 F (37.1 C)    Resp 20    Ht 5\' 9"  (1.753 m)    Wt 92.2 kg    SpO2 91%    BMI 30.02 kg/m      PHYSICAL EXAM    GENERAL:NAD, no fevers, chills, no weakness no fatigue HEAD: Normocephalic, atraumatic.  EYES: Pupils equal, round, reactive to light. Extraocular muscles intact. No scleral icterus.  MOUTH: Moist mucosal membrane. Dentition intact. No abscess noted.  EAR, NOSE, THROAT: Clear without exudates. No external lesions.  NECK: Supple. No thyromegaly. No nodules. No JVD.  PULMONARY: bilateral rhonchi  CARDIOVASCULAR: S1 and S2. Regular rate and rhythm. No murmurs, rubs, or gallops. No edema. Pedal pulses 2+ bilaterally.  GASTROINTESTINAL: Soft, nontender, nondistended. No masses. Positive bowel sounds. No hepatosplenomegaly.  MUSCULOSKELETAL: No swelling, clubbing, or edema. Range of motion full in all extremities.  NEUROLOGIC: Cranial nerves II through XII are intact. No gross focal neurological deficits. Sensation intact. Reflexes intact.  SKIN: No ulceration, lesions, rashes, or cyanosis. Skin warm and dry. Turgor intact.  PSYCHIATRIC: Mood, affect within normal limits. The patient is awake, alert and oriented x 3. Insight, judgment intact.       IMAGING    DG Chest 2 View  Result Date: 06/29/2021 CLINICAL DATA:  Shortness of breath. Chest congestion, cough and body aches. EXAM: CHEST - 2 VIEW COMPARISON:  10/11/2020 FINDINGS: Heart size upper limits of normal. Aortic atherosclerosis as seen previously. Bronchial thickening with patchy infiltrates in both lower lobes consistent with bronchopneumonia. No effusion. No lobar consolidation or collapse. Chronic compression deformities of the thoracolumbar junction region as seen previously. IMPRESSION: Bilateral lower lobe bronchitis and patchy bronchopneumonia. Electronically Signed   By: Nelson Chimes M.D.   On: 06/29/2021 22:25   CT Angio Chest  Pulmonary Embolism (PE) W or WO Contrast  Result Date: 07/01/2021 CLINICAL DATA:  Pulmonary embolism (PE) suspected, unknown D-dimer EXAM: CT ANGIOGRAPHY CHEST WITH CONTRAST TECHNIQUE: Multidetector CT imaging of the chest was performed using the standard protocol during bolus administration of intravenous contrast. Multiplanar CT image reconstructions and MIPs were obtained to evaluate the vascular anatomy. RADIATION DOSE REDUCTION: This exam was performed according to the departmental dose-optimization program which includes automated exposure control, adjustment of the mA and/or kV according to patient size and/or use of iterative reconstruction technique. CONTRAST:  64mL OMNIPAQUE IOHEXOL 350 MG/ML SOLN COMPARISON:  Radiographs 07/01/2021, 06/29/2021, 10/11/2020 FINDINGS: Cardiovascular: Respiratory motion artifact limits evaluation of the segmental and subsegmental vessels. There is no evidence of pulmonary embolism to the level of the lobar arteries. Mild cardiomegaly. No pericardial disease. Coronary artery calcifications. Mild atherosclerotic calcifications of the aortic arch and descending aorta. Mediastinum/Nodes: There are a few prominent mediastinal and hilar lymph nodes, likely reactive. Enlarged, multinodular goiter with mediastinal extension, increased in size in comparison due prior chest CT and March 2012. Lungs/Pleura: High there is multifocal consolidation and ground-glass opacities bilaterally, most confluent in the lower lobes and right middle lobe into a slightly lesser degree in the upper lobes. Small bilateral pleural effusions. No pneumothorax. Upper Abdomen: Airways are patent. Musculoskeletal: Chronic anterior compression fracture of T12. No acute osseous abnormality. Diffuse idiopathic skeletal hyperostosis. Review of the MIP images confirms the above findings. IMPRESSION: Multifocal pneumonia.  Small bilateral pleural effusions. Respiratory motion artifact limits evaluation of the  segmental and subsegmental pulmonary arteries. Within this limitation there is no evidence of pulmonary embolism to the level of the lobar arteries. Multinodular goiter with intrathoracic extension, increased in size in comparison to prior chest CT in March 2012. Aortic Atherosclerosis (ICD10-I70.0). Electronically Signed   By: Maurine Simmering M.D.   On: 07/01/2021 10:04   DG Chest Port 1 View  Result Date: 07/01/2021 CLINICAL DATA:  Dyspnea. EXAM: PORTABLE CHEST 1 VIEW COMPARISON:  06/29/2021 FINDINGS: Heart size is normal. Interval development of bilateral LOWER lobe infiltrates a RIGHT slightly more confluent than the LEFT. IMPRESSION: New bilateral LOWER lobe infiltrates. Electronically Signed   By: Nolon Nations M.D.   On: 07/01/2021 08:55   ECHOCARDIOGRAM COMPLETE  Result Date: 07/02/2021    ECHOCARDIOGRAM REPORT   Patient Name:   RUFINO STAUP Date of Exam: 07/01/2021 Medical Rec #:  [id number]          Height:       69.0 in Accession #:    [id number]         Weight:       203.0 lb Date of Birth:  [birth year]          BSA:          2.079 m Patient Age:    37 years           BP:           155/60 mmHg Patient Gender: M                  HR:           82 bpm. Exam Location:  [facility] Procedure: 2D Echo, Color Doppler and Cardiac Doppler Indications:     R06.03 Acute respiratory distress  History:         Patient has no prior history of Echocardiogram examinations.                  CAD, Stroke;  Risk Factors:Hypertension, Sleep Apnea, HCL and                  Diabetes.  Sonographer:     Charmayne Sheer Referring Phys:  [id number] Richarda Osmond Diagnosing Phys: Yolonda Kida MD IMPRESSIONS  1. Left ventricular ejection fraction, by estimation, is 40 to 45%. The left ventricle has mildly decreased function. The left ventricle demonstrates global hypokinesis. The left ventricular internal cavity size was mildly to moderately dilated. Left ventricular diastolic parameters were normal.  2. Right ventricular  systolic function is low normal. The right ventricular size is mildly enlarged. Mildly increased right ventricular wall thickness.  3. The mitral valve is grossly normal. Trivial mitral valve regurgitation.  4. The aortic valve is grossly normal. Aortic valve regurgitation is not visualized. FINDINGS  Left Ventricle: Left ventricular ejection fraction, by estimation, is 40 to 45%. The left ventricle has mildly decreased function. The left ventricle demonstrates global hypokinesis. The left ventricular internal cavity size was mildly to moderately dilated. There is no left ventricular hypertrophy. Abnormal (paradoxical) septal motion, consistent with left bundle branch block. Left ventricular diastolic parameters were normal. Right Ventricle: The right ventricular size is mildly enlarged. Mildly increased right ventricular wall thickness. Right ventricular systolic function is low normal. Left Atrium: Left atrial size was normal in size. Right Atrium: Right atrial size was normal in size. Pericardium: There is no evidence of pericardial effusion. Mitral Valve: The mitral valve is grossly normal. Trivial mitral valve regurgitation. MV peak gradient, 4.5 mmHg. The mean mitral valve gradient is 3.0 mmHg. Tricuspid Valve: The tricuspid valve is grossly normal. Tricuspid valve regurgitation is trivial. Aortic Valve: The aortic valve is grossly normal. Aortic valve regurgitation is not visualized. Aortic valve mean gradient measures 5.0 mmHg. Aortic valve peak gradient measures 9.4 mmHg. Aortic valve area, by VTI measures 1.95 cm. Pulmonic Valve: The pulmonic valve was normal in structure. Pulmonic valve regurgitation is not visualized. Aorta: The ascending aorta was not well visualized. IAS/Shunts: No atrial level shunt detected by color flow Doppler.  LEFT VENTRICLE PLAX 2D LVIDd:         5.17 cm   Diastology LVIDs:         3.99 cm   LV e' medial:    6.20 cm/s LV PW:         0.98 cm   LV E/e' medial:  14.6 LV IVS:         0.87 cm   LV e' lateral:   6.96 cm/s LVOT diam:     2.00 cm   LV E/e' lateral: 13.0 LV SV:         54 LV SV Index:   26 LVOT Area:     3.14 cm  RIGHT VENTRICLE RV Basal diam:  3.54 cm LEFT ATRIUM             Index        RIGHT ATRIUM           Index LA diam:        4.50 cm 2.16 cm/m   RA Area:     11.20 cm LA Vol (A2C):   28.3 ml 13.61 ml/m  RA Volume:   19.20 ml  9.23 ml/m LA Vol (A4C):   67.0 ml 32.23 ml/m LA Biplane Vol: 46.6 ml 22.41 ml/m  AORTIC VALVE                    PULMONIC VALVE AV Area (Vmax):  1.90 cm     PV Vmax:          1.27 m/s AV Area (Vmean):   2.04 cm     PV Vmean:         82.700 cm/s AV Area (VTI):     1.95 cm     PV VTI:           0.208 m AV Vmax:           153.00 cm/s  PV Peak grad:     6.5 mmHg AV Vmean:          97.400 cm/s  PV Mean grad:     3.0 mmHg AV VTI:            0.275 m      PR End Diast Vel: 3.05 msec AV Peak Grad:      9.4 mmHg AV Mean Grad:      5.0 mmHg LVOT Vmax:         92.70 cm/s LVOT Vmean:        63.200 cm/s LVOT VTI:          0.171 m LVOT/AV VTI ratio: 0.62  AORTA Ao Root diam: 3.10 cm MITRAL VALVE MV Area (PHT): 4.86 cm    SHUNTS MV Area VTI:   1.77 cm    Systemic VTI:  0.17 m MV Peak grad:  4.5 mmHg    Systemic Diam: 2.00 cm MV Mean grad:  3.0 mmHg MV Vmax:       1.06 m/s MV Vmean:      80.7 cm/s MV Decel Time: 156 msec MV E velocity: 90.80 cm/s MV A velocity: 90.80 cm/s MV E/A ratio:  1.00 Dwayne Prince Rome MD Electronically signed by Yolonda Kida MD Signature Date/Time: 07/02/2021/3:46:32 PM    Final       ASSESSMENT/PLAN   Acute hypoxemic respiratory failure.   Patienthas multifocal pneumonia  - RVP is negative  - he shares recurrent pneumonia   - denies mold or fungal exposure or history   - Rocephin and zithromax    - started BID solumedrol 40 daily   -Procalcitonin is mildly elevated continue antibiotics  -MRSA PCR is negative   Acute on chronic systolic CHF S/p TTE with global hypokinesis - lasix 40 bid for 48h more    Thank  you for allowing me to participate in the care of this patient.   Patient/Family are satisfied with care plan and all questions have been answered.  This document was prepared using Dragon voice recognition software and may include unintentional dictation errors.     Ottie Glazier, M.D.  Division of [facility]

## 2021-07-03 NOTE — Progress Notes (Signed)
SLP Cancellation Note  Patient Details Name: Ricardo Nash MRN: [id number] DOB: 08-31-1945   Cancelled treatment:       Reason Eval/Treat Not Completed: Fatigue/lethargy limiting ability to participate (consulted NSG re: pt's status today) Pt was resting; sign on door. NSG stated pt had been uncomfortable w/ constant coughing during the night while attempting to sleep. She recently gave him medications for his anxiety and to help him sleep to get rest. Noted MD's not indicating Pulmonary input adding lasix and steroid. NSG stated he wanted to sleep this morning. NSG denied any difficulty swallowing Pills in puree or liquid cough medicine, liquids this morning. No further chest imaging in chart today. ST services will f/u w/ toleration of diet and any further needs next 1-2 days. NSG agreed.      Orinda Kenner, MS, CCC-SLP Speech Language Pathologist Rehab Services; [place] [phone] (ascom) Darrill Vreeland 07/03/2021, 3:28 PM

## 2021-07-03 NOTE — Progress Notes (Signed)
Progress Note   Patient: Ricardo Nash IHW:[id number] DOB: 05-24-1945 DOA: 06/29/2021     3 DOS: the patient was seen and examined on 07/03/2021   Brief hospital course: Taken from prior notes.  76 year old Caucasian male with a past medical history significant for paroxysmal atrial fibrillation not on chronic anticoagulation, non-insulin-dependent diabetes mellitus type 2, essential hypertension, coronary artery disease on DAPT: Aspirin and Plavix, chronic LBBB.   This patient presented to the emergency department with a 2-day history of body aches, chills, cough and congestion after returning from a cruise to the [place].  He also had a fever of 102 F at home.  Also has shortness of breath.  No chest pain or lower extremity pain/swelling.  Not on oxygen at home.  On arrival he was afebrile, hypoxic on room temperature at mid 80s, initially requiring 4 L of oxygen.  COVID-19 and influenza PCR negative.  Chest x-ray with bilateral patchy infiltrate consistent with bilateral pneumonia.  Respiratory viral panel negative.  CTA was obtained due to worsening hypoxia which was negative for PE but did show multifocal pneumonia.  Currently on cefepime and Zithromax.  Sputum cultures pending. Saturating well on heated high flow at 45 L. 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  Pulmonology was consulted due to worsening hypoxia-they added low-dose steroid.  2/22: Breathing status remained mostly unchanged, remained on heated high flow at 40 L with FiO2 of 55% this morning.  Having bouts of coughing overnight which interfere with his sleep. Pulmonology added Lasix 40 mg twice daily today.  Procalcitonin improving, Fungitell pending.   Assessment and Plan: * Acute respiratory failure with hypoxia (HCC) O2 sat 87% on room air with increased work of breathing, initially requiring 4 L of oxygen,  worsening hypoxia and currently saturating on 40 L with heated high flow.  No baseline oxygen requirement.  CTA negative for PE. Pulmonology was consulted-appreciate their help. -Supplemental O2 to keep sats over 90%-wean as tolerated -Pulmonology added Lasix 40 mg IV twice daily today -Added low-dose steroid -Incentive spirometry and flutter valve  Pneumonia- (present on admission) Mildly elevated procalcitonin.  Sputum cultures with normal respiratory flora.  MRSA swab negative.  Respiratory viral panel negative.  Antibiotics were broadened to cefepime and Zithromax due to worsening hypoxia.  Procalcitonin trending down now. -Continue cefepime and Zithromax -Supplemental oxygen to keep the saturation above 90% -Continue with supportive care -Added some Tussionex as unable to sleep due to cough -Chest PT  Paroxysmal atrial fibrillation ([place])- (present on admission) -Continue carvedilol and aspirin -Not on any anticoagulation at home  Diabetes mellitus (HCC) CBG within goal. -Sliding scale insulin coverage -Continue Semglee 10 units at bedtime  Hypertension- (present on admission)  Blood pressure within goal. -Continue carvedilol, amlodipine, lisinopril and hydrochlorothiazide  CAD (coronary artery disease), native coronary artery- (present on admission)  No chest pain. -Continue aspirin, atorvastatin and carvedilol and lisinopril  GERD (gastroesophageal reflux disease)- (present on admission) - Continue Protonix  Obstructive sleep apnea- (present on admission) Continue with CPAP at night which he uses at home once hypoxia improves  Subjective: Patient was little somnolent when seen today.  Per nursing staff he received cough medicine and Xanax as he was unable to sleep overnight due to coughing.  Easily arousable, denies any pain.  Wants to sleep.  Physical Exam: Vitals:   07/03/21 0500 07/03/21 0724 07/03/21 0806 07/03/21 1128  BP:  (!) 162/69  Marland Kitchen)  144/60  Pulse:  86  79   Resp:  20  20  Temp:  98.7 F (37.1 C)  98.7 F (37.1 C)  TempSrc:  Oral    SpO2:  91% 90% 91%  Weight: 92.2 kg     Height:       General.  Somnolent gentleman, in no acute distress. Pulmonary.  Lungs clear bilaterally, normal respiratory effort. CV.  Regular rate and rhythm, no JVD, rub or murmur. Abdomen.  Soft, nontender, nondistended, BS positive. CNS.  Somnolent, no apparent focal deficit.  Easily arousable and following commands. Extremities.  No edema, no cyanosis, pulses intact and symmetrical. Psychiatry.  Judgment and insight appears normal.  Data Reviewed: Reviewed prior notes, labs and imaging  Family Communication:   Disposition: Status is: Inpatient Remains inpatient appropriate because: Severity of illness   Planned Discharge Destination: Home with Home Health  DVT prophylaxis.  Lovenox  Time spent: 50 minutes  This record has been created using Systems analyst. Errors have been sought and corrected,but may not always be located. Such creation errors do not reflect on the standard of care.  Author: Lorella Nimrod, MD 07/03/2021 1:21 PM  For on call review [email or web address].

## 2021-07-03 NOTE — Assessment & Plan Note (Signed)
-  Continue carvedilol and aspirin -Not on any anticoagulation at home

## 2021-07-03 NOTE — Assessment & Plan Note (Signed)
O2 sat 87% on room air with increased work of breathing, initially requiring 4 L of oxygen, worsening hypoxia and currently saturating on 40 L with heated high flow.  No baseline oxygen requirement.  CTA negative for PE. Pulmonology was consulted-appreciate their help. -Supplemental O2 to keep sats over 90%-wean as tolerated -Pulmonology added Lasix 40 mg IV twice daily today -Added low-dose steroid -Incentive spirometry and flutter valve

## 2021-07-03 NOTE — Assessment & Plan Note (Addendum)
CBG within goal. -Sliding scale insulin coverage -Continue Semglee 10 units at bedtime

## 2021-07-03 NOTE — Progress Notes (Signed)
Mucinex administered at 23:16 pm 07/02/21 for anxiety  Xanax administered at 01:25 am 07/03/21  Patient complained of Shortness of breath. Breathing treatment performed on Patient. Breathing improved.  Patient had bouts of cough later in shift. Orders placed for Phenylephrine. Administered.  Patient is currently asleep and breathing has improved.  SpO2 in the 90's.

## 2021-07-03 NOTE — Assessment & Plan Note (Signed)
Mildly elevated procalcitonin.  Sputum cultures with normal respiratory flora.  MRSA swab negative.  Respiratory viral panel negative.  Antibiotics were broadened to cefepime and Zithromax due to worsening hypoxia.  Procalcitonin trending down now. -Continue cefepime and Zithromax -Supplemental oxygen to keep the saturation above 90% -Continue with supportive care -Added some Tussionex as unable to sleep due to cough -Chest PT

## 2021-07-04 LAB — C-REACTIVE PROTEIN: CRP: 16.4 mg/dL — ABNORMAL HIGH (ref <1.0)

## 2021-07-04 LAB — BASIC METABOLIC PANEL
Anion gap: 11 (ref 5–15)
BUN: 48 mg/dL — ABNORMAL HIGH (ref 8–23)
CO2: 27 mmol/L (ref 22–32)
Calcium: 8.5 mg/dL — ABNORMAL LOW (ref 8.9–10.3)
Chloride: 100 mmol/L (ref 98–111)
Creatinine, Ser: 1.23 mg/dL (ref 0.61–1.24)
GFR, Estimated: >60 mL/min (ref >60)
Glucose, Bld: 120 mg/dL — ABNORMAL HIGH (ref 70–99)
Potassium: 3.8 mmol/L (ref 3.5–5.1)
Sodium: 138 mmol/L (ref 135–145)

## 2021-07-04 LAB — GLUCOSE, CAPILLARY
Glucose-Capillary: 143 mg/dL — ABNORMAL HIGH (ref 70–99)
Glucose-Capillary: 96 mg/dL (ref 70–99)
Glucose-Capillary: 136 mg/dL — ABNORMAL HIGH (ref 70–99)

## 2021-07-04 LAB — PROCALCITONIN: Procalcitonin: <0.1 ng/mL

## 2021-07-04 MED ORDER — PREDNISONE 20 MG PO TABS
20.0000 mg | ORAL_TABLET | Freq: Every day | ORAL | Status: DC
  Administered 2021-07-05 – 2021-07-08 (×4): 20 mg via ORAL
  Filled 2021-07-04 (×4): qty 1

## 2021-07-04 NOTE — Evaluation (Signed)
Physical Therapy Evaluation Patient Details Name: Ricardo Nash MRN: [id number] DOB: 09-May-1946 Today's Date: 07/04/2021  History of Present Illness  Pt is a 76 yo M came in with day history of body aches, chills, cough and congestion starting on his return from a cruise to the [place]. Workup showed PNA, acute respiratory failure. PMH of PAF, DM, HTN.   Clinical Impression  Patient alert, oriented x4, report some generalized low back pain but did not quantify. The patient reported at baseline  he is independent.   The patient was able to perform supine to sit with supervision. Able to sit EOB for several minutes with good balance, spO2 monitored throughout. Ranging 88-94% on 40L at 40% FiO2. Step pivot to recliner with CGA, and then after a seated rest break sit <> stand from recliner with RW, and ambulated ~58ft. Pt did report fatigue at end of session. PT spent time educating pt/family on importance of continued mobility and repositioning.  Overall the patient demonstrated deficits (see "PT Problem List") that impede the patient's functional abilities, safety, and mobility and would benefit from skilled PT intervention. Recommendation is HHPT with and frequent/constant supervision/assistance.        Recommendations for follow up therapy are one component of a multi-disciplinary discharge planning process, led by the attending physician.  Recommendations may be updated based on patient status, additional functional criteria and insurance authorization.  Follow Up Recommendations Home health PT    Assistance Recommended at Discharge Frequent or constant Supervision/Assistance  Patient can return home with the following  A little help with walking and/or transfers;A little help with bathing/dressing/bathroom;Assistance with cooking/housework;Direct supervision/assist for medications management;Help with stairs or ramp for entrance;Assist for transportation    Equipment Recommendations None  recommended by PT  Recommendations for Other Services       Functional Status Assessment Patient has had a recent decline in their functional status and demonstrates the ability to make significant improvements in function in a reasonable and predictable amount of time.     Precautions / Restrictions Precautions Precautions: Fall Restrictions Weight Bearing Restrictions: No      Mobility  Bed Mobility Overal bed mobility: Needs Assistance Bed Mobility: Supine to Sit     Supine to sit: Supervision, HOB elevated          Transfers Overall transfer level: Needs assistance Equipment used: 1 person hand held assist, Rolling walker (2 wheels) Transfers: Sit to/from Stand, Bed to chair/wheelchair/BSC Sit to Stand: Min guard   Step pivot transfers: Min guard            Ambulation/Gait   Gait Distance (Feet): 7 Feet Assistive device: Rolling walker (2 wheels)         General Gait Details: cued for RW use, unable to ambulate further due to [place]            Wheelchair Mobility    Modified Rankin (Stroke Patients Only)       Balance Overall balance assessment: Needs assistance Sitting-balance support: Feet supported Sitting balance-Leahy Scale: Good       Standing balance-Leahy Scale: Fair Standing balance comment: improved steadiness with RW                             Pertinent Vitals/Pain Pain Assessment Pain Assessment:  (reported some buttocks pain, but did not quantify)    Home Living Family/patient expects to be discharged to:: Private residence Living Arrangements: Spouse/significant other Available  Help at Discharge: Family;Available 24 hours/day Type of Home: House Home Access: Stairs to enter Entrance Stairs-Rails: Can reach both Entrance Stairs-Number of Steps: 2   Home Layout: One level Home Equipment: Shower seat - built in;Grab bars - tub/shower;Hand held shower head;Cane - single point;Rolling Environmental consultant (2  wheels)      Prior Function Prior Level of Function : Independent/Modified Independent                     Hand Dominance   Dominant Hand: Right    Extremity/Trunk Assessment   Upper Extremity Assessment Upper Extremity Assessment: Generalized weakness    Lower Extremity Assessment Lower Extremity Assessment: Generalized weakness    Cervical / Trunk Assessment Cervical / Trunk Assessment: Normal  Communication   Communication: HOH  Cognition Arousal/Alertness: Awake/alert Behavior During Therapy: WFL for tasks assessed/performed Overall Cognitive Status: Within Functional Limits for tasks assessed                                          General Comments      Exercises     Assessment/Plan    PT Assessment Patient needs continued PT services  PT Problem List Decreased strength;Decreased mobility;Decreased activity tolerance;Decreased balance;Cardiopulmonary status limiting activity       PT Treatment Interventions DME instruction;Therapeutic exercise;Gait training;Balance training;Stair training;Neuromuscular re-education;Functional mobility training;Therapeutic activities;Patient/family education    PT Goals (Current goals can be found in the Care Plan section)  Acute Rehab PT Goals Patient Stated Goal: to return to PLOF PT Goal Formulation: With patient Time For Goal Achievement: 07/18/21 Potential to Achieve Goals: Good    Frequency Min 2X/week     Co-evaluation               AM-PAC PT "6 Clicks" Mobility  Outcome Measure Help needed turning from your back to your side while in a flat bed without using bedrails?: A Little Help needed moving from lying on your back to sitting on the side of a flat bed without using bedrails?: A Little Help needed moving to and from a bed to a chair (including a wheelchair)?: A Little Help needed standing up from a chair using your arms (e.g., wheelchair or bedside chair)?: A Little Help  needed to walk in hospital room?: A Little Help needed climbing 3-5 steps with a railing? : A Little 6 Click Score: 18    End of Session Equipment Utilized During Treatment: Oxygen (40L at 40% FiO2) Activity Tolerance: Patient tolerated treatment well Patient left: in chair;with chair alarm set;with call bell/phone within reach;with family/visitor present Nurse Communication: Mobility status PT Visit Diagnosis: Other abnormalities of gait and mobility (R26.89);Difficulty in walking, not elsewhere classified (R26.2);Muscle weakness (generalized) (M62.81)    Time: 3825-0539 PT Time Calculation (min) (ACUTE ONLY): 40 min   Charges:   PT Evaluation $PT Eval Low Complexity: 1 Low PT Treatments $Therapeutic Activity: 23-37 mins      Lieutenant Diego PT, DPT 3:43 PM,07/04/21

## 2021-07-04 NOTE — Assessment & Plan Note (Signed)
O2 sat 87% on room air with increased work of breathing, initially requiring 4 L of oxygen, worsening hypoxia and currently saturating on 40 L with heated high flow.  No baseline oxygen requirement.  CTA negative for PE.  Clinically seems stable. Pulmonology was consulted-appreciate their help. -Supplemental O2 to keep sats over 90%-wean as tolerated -Pulmonology added Lasix 40 mg IV twice daily-diuresing well with some improvement in creatinine. -Added low-dose steroid -Incentive spirometry and flutter valve

## 2021-07-04 NOTE — Progress Notes (Signed)
Progress Note   Patient: Ricardo Nash YOM:[id number] DOB: 1946/04/05 DOA: 06/29/2021     4 DOS: the patient was seen and examined on 07/04/2021   Brief hospital course: Taken from prior notes.  76 year old Caucasian male with a past medical history significant for paroxysmal atrial fibrillation not on chronic anticoagulation, non-insulin-dependent diabetes mellitus type 2, essential hypertension, coronary artery disease on DAPT: Aspirin and Plavix, chronic LBBB.   This patient presented to the emergency department with a 2-day history of body aches, chills, cough and congestion after returning from a cruise to the [place].  He also had a fever of 102 F at home.  Also has shortness of breath.  No chest pain or lower extremity pain/swelling.  Not on oxygen at home.  On arrival he was afebrile, hypoxic on room temperature at mid 80s, initially requiring 4 L of oxygen.  COVID-19 and influenza PCR negative.  Chest x-ray with bilateral patchy infiltrate consistent with bilateral pneumonia.  Respiratory viral panel negative.  CTA was obtained due to worsening hypoxia which was negative for PE but did show multifocal pneumonia.  Currently on cefepime and Zithromax.  Sputum cultures pending. Saturating well on heated high flow at 45 L. 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  Pulmonology was consulted due to worsening hypoxia-they added low-dose steroid.  2/22: Breathing status remained mostly unchanged, remained on heated high flow at 40 L with FiO2 of 55% this morning.  Having bouts of coughing overnight which interfere with his sleep. Pulmonology added Lasix 40 mg twice daily today.  Procalcitonin improving, Fungitell pending.  2/23: Patient appears more comfortable.  Remained on heated high flow at 40 L.  Diuresing well with Lasix with some improvement in creatinine.  Procalcitonin  improving with some worsening of CRP.   Assessment and Plan: * Acute respiratory failure with hypoxia (HCC) O2 sat 87% on room air with increased work of breathing, initially requiring 4 L of oxygen, worsening hypoxia and currently saturating on 40 L with heated high flow.  No baseline oxygen requirement.  CTA negative for PE.  Clinically seems stable. Pulmonology was consulted-appreciate their help. -Supplemental O2 to keep sats over 90%-wean as tolerated -Pulmonology added Lasix 40 mg IV twice daily-diuresing well with some improvement in creatinine. -Added low-dose steroid -Incentive spirometry and flutter valve  Pneumonia- (present on admission) Mildly elevated procalcitonin.  Sputum cultures with normal respiratory flora.  MRSA swab negative.  Respiratory viral panel negative.  Antibiotics were broadened to cefepime and Zithromax due to worsening hypoxia.  Procalcitonin trending down now. -Continue cefepime and Zithromax-to complete a 5-day course. -Supplemental oxygen to keep the saturation above 90% -Continue with supportive care -Continue Tussionex -specially at night to get a better sleep -Chest PT  Paroxysmal atrial fibrillation ([facility])- (present on admission) -Continue carvedilol and aspirin -Not on any anticoagulation at home  Diabetes mellitus (HCC) CBG within goal. -Sliding scale insulin coverage -Continue Semglee 10 units at bedtime  Hypertension- (present on admission)  Blood pressure within goal. -Continue carvedilol, amlodipine, lisinopril and hydrochlorothiazide  CAD (coronary artery disease), native coronary artery- (present on admission)  No chest pain. -Continue aspirin, atorvastatin and carvedilol and lisinopril  GERD (gastroesophageal reflux disease)- (present on admission) - Continue Protonix  Obstructive sleep apnea- (present on admission) Continue with CPAP at night which he uses at home once hypoxia improves   Subjective: Patient appears more  comfortable when seen today.  Per patient  he was able to get some sleep last night as Tussionex help with cough.  Wife at bedside.  Physical Exam: Vitals:   07/04/21 0745 07/04/21 0812 07/04/21 1133 07/04/21 1315  BP:  (!) 148/56 (!) 138/54   Pulse:  91 78   Resp:  (!) 22    Temp:  98.9 F (37.2 C) 98.5 F (36.9 C)   TempSrc:   Oral   SpO2: 91% 91% 95% 91%  Weight:      Height:       General.  Well-developed gentleman, in no acute distress. Pulmonary.  Lungs clear bilaterally, normal respiratory effort.  Coarse breath sounds CV.  Regular rate and rhythm, no JVD, rub or murmur. Abdomen.  Soft, nontender, nondistended, BS positive. CNS.  Alert and oriented .  No focal neurologic deficit. Extremities.  No edema, no cyanosis, pulses intact and symmetrical. Psychiatry.  Judgment and insight appears normal.  Data Reviewed: I reviewed prior notes and labs.  Family Communication: Discussed with wife at bedside  Disposition: Status is: Inpatient Remains inpatient appropriate because: Severity of illness   Planned Discharge Destination: Home with Home Health  DVT prophylaxis.  Lovenox  Time spent: 45 minutes  This record has been created using Systems analyst. Errors have been sought and corrected,but may not always be located. Such creation errors do not reflect on the standard of care.  Author: Lorella Nimrod, MD 07/04/2021 4:41 PM  For on call review [email or web address].

## 2021-07-04 NOTE — Progress Notes (Signed)
PULMONOLOGY         Date: 07/04/2021,   MRN# [id number] [facility] [birth year]     AdmissionWeight: 92.1 kg                 CurrentWeight: 92.2 kg   Referring physician: Dr Reesa Chew   CHIEF COMPLAINT:   Acute hypoxemic respiratory failure   HISTORY OF PRESENT ILLNESS   This is a 76 yo M with hx of PAF, dm htn came in with day history of body aches, chills, cough and congestion starting on his return from a cruise to the [place]. He also had a fever of 102. He has associated shortness of breath.  States his symptoms are similar to when he had pneumonia sometime ago.  arrival afebrile with pulse 87, BP 142/80, O2 sat 87% on room air improving to 94% on 4 L.  COVID is negative.   He had CT chest which I reviewed independently and is with findings of multifocal bilateral pneumonia.   PCCM consult for severe acute hypoxemic respiratory failure  07/03/21-  patient is on 60L/min HFNC with 45L/min, CRP is trending down with steroids. PCT is trending down with antibiotics.  He is no better today clinically.  He is reciving narcotics and xanax which may hinder his respiratory status and contribute to shallow breathing.  07/04/21-  patient is slightly improved.  He is able to speak now and is on less oxygen via HFNC.    PAST MEDICAL HISTORY   Past Medical History:  Diagnosis Date   C. difficile colitis    Coronary artery disease    Depression    Diabetes mellitus ([place])    Diverticulitis    Diverticulosis    Dysrhythmia    PAF   Environmental allergies    GERD (gastroesophageal reflux disease)    Goiter    intrathoracic, s/p benign biopsy (Dr Harlow Asa)   Hypercholesterolemia    Hypertension    Osteoarthritis    cervical spine, lumbar spine   Paroxysmal atrial fibrillation (HCC)    Pneumonia    PONV (postoperative nausea and vomiting)    Sleep apnea    CPAP @ NIGHT   Stroke [facility]    TIA (transient ischemic attack)      SURGICAL HISTORY    Past Surgical History:  Procedure Laterality Date   BACK SURGERY  8/08   s/p fusion of L4-L5   capsule endoscopy     CATARACT EXTRACTION  2011   Dr. Herbert Deaner   CERVICAL DISC SURGERY  2002   COLONOSCOPY WITH PROPOFOL N/A 07/20/2017   Procedure: COLONOSCOPY WITH PROPOFOL;  Surgeon: Manya Silvas, MD;  Location: [facility] ENDOSCOPY;  Service: Endoscopy;  Laterality: N/A;   EYE SURGERY     cataract bilateral 10/1999   eyelid reduction     Dr. Kerby Less HERNIA REPAIR  1991   Dr, Cassell Clement LAMINECTOMY/DECOMPRESSION MICRODISCECTOMY N/A 10/15/2020   Procedure: Laminectomy - Lumbar Two-Lumbar Three - Lumbar Three-Lumbar Four with sublaminar decompression;  Surgeon: Eustace Moore, MD;  Location: [facility];  Service: Neurosurgery;  Laterality: N/A;  Laminectomy - Lumbar Two-Lumbar Three - Lumbar Three-Lumbar Four with sublaminar decompression   NOSE SURGERY     turbinate reduction   SEPTOPLASTY  1975   SHOULDER SURGERY  2000   rotator cuff     FAMILY HISTORY   Family History  Problem Relation Age of Onset   Congestive Heart Failure Father    Heart  disease Father        myocardial infarction   Rheumatic fever Father        valvular disease   Heart disease Mother        s/p CABG (age 86)   Kidney disease Sister    Colon cancer Neg Hx    Prostate cancer Neg Hx      SOCIAL HISTORY   Social History   Tobacco Use   Smoking status: Former   Smokeless tobacco: Never  Scientific laboratory technician Use: Never used  Substance Use Topics   Alcohol use: Yes    Alcohol/week: 0.0 standard drinks    Comment: occasional   Drug use: No     MEDICATIONS    Home Medication:    Current Medication:  Current Facility-Administered Medications:    acetaminophen (TYLENOL) tablet 650 mg, 650 mg, Oral, Q6H PRN, 650 mg at 07/04/21 1245 **OR** acetaminophen (TYLENOL) suppository 650 mg, 650 mg, Rectal, Q6H PRN, Athena Masse, MD   albuterol (PROVENTIL) (2.5 MG/3ML)  0.083% nebulizer solution 2.5 mg, 2.5 mg, Nebulization, Q2H PRN, Foust, Katy L, NP, 2.5 mg at 07/03/21 0452   ALPRAZolam (XANAX) tablet 0.5 mg, 0.5 mg, Oral, TID PRN, Renda Rolls, RPH, 0.5 mg at 07/03/21 6301   aspirin EC tablet 81 mg, 81 mg, Oral, QPM, Athena Masse, MD, 81 mg at 07/03/21 1802   atorvastatin (LIPITOR) tablet 20 mg, 20 mg, Oral, Daily, Judd Gaudier V, MD, 20 mg at 07/04/21 0936   carvedilol (COREG) tablet 25 mg, 25 mg, Oral, BID WC, Judd Gaudier V, MD, 25 mg at 07/04/21 0935   ceFEPIme (MAXIPIME) 2 g in sodium chloride 0.9 % 100 mL IVPB, 2 g, Intravenous, Q12H, Lorella Nimrod, MD, Last Rate: 200 mL/hr at 07/04/21 0947, 2 g at 07/04/21 0947   chlorpheniramine-HYDROcodone 10-8 MG/5ML suspension 5 mL, 5 mL, Oral, Q12H PRN, Lorella Nimrod, MD, 5 mL at 07/04/21 0815   enoxaparin (LOVENOX) injection 40 mg, 40 mg, Subcutaneous, Q24H, Judd Gaudier V, MD, 40 mg at 07/04/21 0936   fluticasone (FLONASE) 50 MCG/ACT nasal spray 2 spray, 2 spray, Each Nare, Daily, Dorothe Pea, RPH, 2 spray at 07/04/21 6010   furosemide (LASIX) injection 40 mg, 40 mg, Intravenous, BID, Ottie Glazier, MD, 40 mg at 07/04/21 0936   guaiFENesin (MUCINEX) 12 hr tablet 1,200 mg, 1,200 mg, Oral, BID, Anwar, Shayan S, DO, 1,200 mg at 07/04/21 0936   hydrALAZINE (APRESOLINE) injection 10 mg, 10 mg, Intravenous, Q8H PRN, Lorella Nimrod, MD   insulin aspart (novoLOG) injection 0-15 Units, 0-15 Units, Subcutaneous, TID WC, Judd Gaudier V, MD, 2 Units at 07/04/21 1246   insulin glargine-yfgn (SEMGLEE) injection 10 Units, 10 Units, Subcutaneous, QHS, Amin, Soundra Pilon, MD, 10 Units at 07/03/21 2130   ipratropium-albuterol (DUONEB) 0.5-2.5 (3) MG/3ML nebulizer solution 3 mL, 3 mL, Nebulization, Q6H, Anwar, Shayan S, DO, 3 mL at 07/04/21 1315   loratadine (CLARITIN) tablet 10 mg, 10 mg, Oral, Daily, Dorothe Pea, RPH, 10 mg at 07/04/21 9323   MEDLINE mouth rinse, 15 mL, Mouth Rinse, BID, Judd Gaudier  V, MD, 15 mL at 07/04/21 5573   menthol-cetylpyridinium (CEPACOL) lozenge 3 mg, 1 lozenge, Oral, PRN, Foust, Katy L, NP, 3 mg at 07/04/21 1246   metFORMIN (GLUCOPHAGE) tablet 1,000 mg, 1,000 mg, Oral, BID WC, Doristine Mango L, MD, 1,000 mg at 07/04/21 0936   ondansetron (ZOFRAN) tablet 4 mg, 4 mg, Oral, Q6H PRN **OR** ondansetron (ZOFRAN) injection 4 mg, 4  mg, Intravenous, Q6H PRN, Athena Masse, MD, 4 mg at 06/30/21 1112   pantoprazole (PROTONIX) EC tablet 40 mg, 40 mg, Oral, Daily, Richarda Osmond, MD, 40 mg at 07/04/21 1610   pioglitazone (ACTOS) tablet 15 mg, 15 mg, Oral, Daily, Richarda Osmond, MD, 15 mg at 07/04/21 9604   sertraline (ZOLOFT) tablet 100 mg, 100 mg, Oral, Daily, Richarda Osmond, MD, 100 mg at 07/04/21 0935   tamsulosin (FLOMAX) capsule 0.4 mg, 0.4 mg, Oral, Q breakfast, Richarda Osmond, MD, 0.4 mg at 07/04/21 5409   traZODone (DESYREL) tablet 75 mg, 75 mg, Oral, QHS, Richarda Osmond, MD, 75 mg at 07/03/21 2129    ALLERGIES   Penicillins     REVIEW OF SYSTEMS    Review of Systems:  Gen:  Denies  fever, sweats, chills weigh loss  HEENT: Denies blurred vision, double vision, ear pain, eye pain, hearing loss, nose bleeds, sore throat Cardiac:  No dizziness, chest pain or heaviness, chest tightness,edema Resp:   Denies cough or sputum porduction, shortness of breath,wheezing, hemoptysis,  Gi: Denies swallowing difficulty, stomach pain, nausea or vomiting, diarrhea, constipation, bowel incontinence Gu:  Denies bladder incontinence, burning urine Ext:   Denies Joint pain, stiffness or swelling Skin: Denies  skin rash, easy bruising or bleeding or hives Endoc:  Denies polyuria, polydipsia , polyphagia or weight change Psych:   Denies depression, insomnia or hallucinations   Other:  All other systems negative   VS: BP (!) 138/54 (BP Location: Right Arm)    Pulse 78    Temp 98.5 F (36.9 C) (Oral)    Resp (!) 22    Ht 5\' 9"  (1.753 m)     Wt 92.2 kg    SpO2 91%    BMI 30.02 kg/m      PHYSICAL EXAM    GENERAL:NAD, no fevers, chills, no weakness no fatigue HEAD: Normocephalic, atraumatic.  EYES: Pupils equal, round, reactive to light. Extraocular muscles intact. No scleral icterus.  MOUTH: Moist mucosal membrane. Dentition intact. No abscess noted.  EAR, NOSE, THROAT: Clear without exudates. No external lesions.  NECK: Supple. No thyromegaly. No nodules. No JVD.  PULMONARY: bilateral rhonchi  CARDIOVASCULAR: S1 and S2. Regular rate and rhythm. No murmurs, rubs, or gallops. No edema. Pedal pulses 2+ bilaterally.  GASTROINTESTINAL: Soft, nontender, nondistended. No masses. Positive bowel sounds. No hepatosplenomegaly.  MUSCULOSKELETAL: No swelling, clubbing, or edema. Range of motion full in all extremities.  NEUROLOGIC: Cranial nerves II through XII are intact. No gross focal neurological deficits. Sensation intact. Reflexes intact.  SKIN: No ulceration, lesions, rashes, or cyanosis. Skin warm and dry. Turgor intact.  PSYCHIATRIC: Mood, affect within normal limits. The patient is awake, alert and oriented x 3. Insight, judgment intact.       IMAGING    DG Chest 2 View  Result Date: 06/29/2021 CLINICAL DATA:  Shortness of breath. Chest congestion, cough and body aches. EXAM: CHEST - 2 VIEW COMPARISON:  10/11/2020 FINDINGS: Heart size upper limits of normal. Aortic atherosclerosis as seen previously. Bronchial thickening with patchy infiltrates in both lower lobes consistent with bronchopneumonia. No effusion. No lobar consolidation or collapse. Chronic compression deformities of the thoracolumbar junction region as seen previously. IMPRESSION: Bilateral lower lobe bronchitis and patchy bronchopneumonia. Electronically Signed   By: Nelson Chimes M.D.   On: 06/29/2021 22:25   CT Angio Chest Pulmonary Embolism (PE) W or WO Contrast  Result Date: 07/01/2021 CLINICAL DATA:  Pulmonary embolism (PE) suspected, unknown D-dimer  EXAM: CT ANGIOGRAPHY CHEST WITH CONTRAST TECHNIQUE: Multidetector CT imaging of the chest was performed using the standard protocol during bolus administration of intravenous contrast. Multiplanar CT image reconstructions and MIPs were obtained to evaluate the vascular anatomy. RADIATION DOSE REDUCTION: This exam was performed according to the departmental dose-optimization program which includes automated exposure control, adjustment of the mA and/or kV according to patient size and/or use of iterative reconstruction technique. CONTRAST:  47mL OMNIPAQUE IOHEXOL 350 MG/ML SOLN COMPARISON:  Radiographs 07/01/2021, 06/29/2021, 10/11/2020 FINDINGS: Cardiovascular: Respiratory motion artifact limits evaluation of the segmental and subsegmental vessels. There is no evidence of pulmonary embolism to the level of the lobar arteries. Mild cardiomegaly. No pericardial disease. Coronary artery calcifications. Mild atherosclerotic calcifications of the aortic arch and descending aorta. Mediastinum/Nodes: There are a few prominent mediastinal and hilar lymph nodes, likely reactive. Enlarged, multinodular goiter with mediastinal extension, increased in size in comparison due prior chest CT and March 2012. Lungs/Pleura: High there is multifocal consolidation and ground-glass opacities bilaterally, most confluent in the lower lobes and right middle lobe into a slightly lesser degree in the upper lobes. Small bilateral pleural effusions. No pneumothorax. Upper Abdomen: Airways are patent. Musculoskeletal: Chronic anterior compression fracture of T12. No acute osseous abnormality. Diffuse idiopathic skeletal hyperostosis. Review of the MIP images confirms the above findings. IMPRESSION: Multifocal pneumonia.  Small bilateral pleural effusions. Respiratory motion artifact limits evaluation of the segmental and subsegmental pulmonary arteries. Within this limitation there is no evidence of pulmonary embolism to the level of the lobar  arteries. Multinodular goiter with intrathoracic extension, increased in size in comparison to prior chest CT in March 2012. Aortic Atherosclerosis (ICD10-I70.0). Electronically Signed   By: Maurine Simmering M.D.   On: 07/01/2021 10:04   DG Chest Port 1 View  Result Date: 07/01/2021 CLINICAL DATA:  Dyspnea. EXAM: PORTABLE CHEST 1 VIEW COMPARISON:  06/29/2021 FINDINGS: Heart size is normal. Interval development of bilateral LOWER lobe infiltrates a RIGHT slightly more confluent than the LEFT. IMPRESSION: New bilateral LOWER lobe infiltrates. Electronically Signed   By: Nolon Nations M.D.   On: 07/01/2021 08:55   ECHOCARDIOGRAM COMPLETE  Result Date: 07/02/2021    ECHOCARDIOGRAM REPORT   Patient Name:   Ricardo Nash Date of Exam: 07/01/2021 Medical Rec #:  [id number]          Height:       69.0 in Accession #:    [id number]         Weight:       203.0 lb Date of Birth:  [birth year]          BSA:          2.079 m Patient Age:    89 years           BP:           155/60 mmHg Patient Gender: M                  HR:           82 bpm. Exam Location:  [facility] Procedure: 2D Echo, Color Doppler and Cardiac Doppler Indications:     R06.03 Acute respiratory distress  History:         Patient has no prior history of Echocardiogram examinations.                  CAD, Stroke; Risk Factors:Hypertension, Sleep Apnea, HCL and  Diabetes.  Sonographer:     Charmayne Sheer Referring Phys:  [id number] Richarda Osmond Diagnosing Phys: Yolonda Kida MD IMPRESSIONS  1. Left ventricular ejection fraction, by estimation, is 40 to 45%. The left ventricle has mildly decreased function. The left ventricle demonstrates global hypokinesis. The left ventricular internal cavity size was mildly to moderately dilated. Left ventricular diastolic parameters were normal.  2. Right ventricular systolic function is low normal. The right ventricular size is mildly enlarged. Mildly increased right ventricular wall thickness.  3. The mitral  valve is grossly normal. Trivial mitral valve regurgitation.  4. The aortic valve is grossly normal. Aortic valve regurgitation is not visualized. FINDINGS  Left Ventricle: Left ventricular ejection fraction, by estimation, is 40 to 45%. The left ventricle has mildly decreased function. The left ventricle demonstrates global hypokinesis. The left ventricular internal cavity size was mildly to moderately dilated. There is no left ventricular hypertrophy. Abnormal (paradoxical) septal motion, consistent with left bundle branch block. Left ventricular diastolic parameters were normal. Right Ventricle: The right ventricular size is mildly enlarged. Mildly increased right ventricular wall thickness. Right ventricular systolic function is low normal. Left Atrium: Left atrial size was normal in size. Right Atrium: Right atrial size was normal in size. Pericardium: There is no evidence of pericardial effusion. Mitral Valve: The mitral valve is grossly normal. Trivial mitral valve regurgitation. MV peak gradient, 4.5 mmHg. The mean mitral valve gradient is 3.0 mmHg. Tricuspid Valve: The tricuspid valve is grossly normal. Tricuspid valve regurgitation is trivial. Aortic Valve: The aortic valve is grossly normal. Aortic valve regurgitation is not visualized. Aortic valve mean gradient measures 5.0 mmHg. Aortic valve peak gradient measures 9.4 mmHg. Aortic valve area, by VTI measures 1.95 cm. Pulmonic Valve: The pulmonic valve was normal in structure. Pulmonic valve regurgitation is not visualized. Aorta: The ascending aorta was not well visualized. IAS/Shunts: No atrial level shunt detected by color flow Doppler.  LEFT VENTRICLE PLAX 2D LVIDd:         5.17 cm   Diastology LVIDs:         3.99 cm   LV e' medial:    6.20 cm/s LV PW:         0.98 cm   LV E/e' medial:  14.6 LV IVS:        0.87 cm   LV e' lateral:   6.96 cm/s LVOT diam:     2.00 cm   LV E/e' lateral: 13.0 LV SV:         54 LV SV Index:   26 LVOT Area:     3.14 cm   RIGHT VENTRICLE RV Basal diam:  3.54 cm LEFT ATRIUM             Index        RIGHT ATRIUM           Index LA diam:        4.50 cm 2.16 cm/m   RA Area:     11.20 cm LA Vol (A2C):   28.3 ml 13.61 ml/m  RA Volume:   19.20 ml  9.23 ml/m LA Vol (A4C):   67.0 ml 32.23 ml/m LA Biplane Vol: 46.6 ml 22.41 ml/m  AORTIC VALVE                    PULMONIC VALVE AV Area (Vmax):    1.90 cm     PV Vmax:          1.27 m/s AV  Area (Vmean):   2.04 cm     PV Vmean:         82.700 cm/s AV Area (VTI):     1.95 cm     PV VTI:           0.208 m AV Vmax:           153.00 cm/s  PV Peak grad:     6.5 mmHg AV Vmean:          97.400 cm/s  PV Mean grad:     3.0 mmHg AV VTI:            0.275 m      PR End Diast Vel: 3.05 msec AV Peak Grad:      9.4 mmHg AV Mean Grad:      5.0 mmHg LVOT Vmax:         92.70 cm/s LVOT Vmean:        63.200 cm/s LVOT VTI:          0.171 m LVOT/AV VTI ratio: 0.62  AORTA Ao Root diam: 3.10 cm MITRAL VALVE MV Area (PHT): 4.86 cm    SHUNTS MV Area VTI:   1.77 cm    Systemic VTI:  0.17 m MV Peak grad:  4.5 mmHg    Systemic Diam: 2.00 cm MV Mean grad:  3.0 mmHg MV Vmax:       1.06 m/s MV Vmean:      80.7 cm/s MV Decel Time: 156 msec MV E velocity: 90.80 cm/s MV A velocity: 90.80 cm/s MV E/A ratio:  1.00 Dwayne Prince Rome MD Electronically signed by Yolonda Kida MD Signature Date/Time: 07/02/2021/3:46:32 PM    Final       ASSESSMENT/PLAN   Acute hypoxemic respiratory failure.   Patienthas multifocal pneumonia  - RVP is negative  - he shares recurrent pneumonia   - denies mold or fungal exposure or history   - Rocephin and zithromax    -Procalcitonin is mildly elevated continue antibiotics  -MRSA PCR is negative -prednisone at 20mg  , CRP increased may need to go back up and reduce slowly   Acute on chronic systolic CHF S/p TTE with global hypokinesis - lasix 40 bid for 48h more     Acute kidney injury - KDIGO 2 -due to acutely ill status with pneumonia and possible ATN  -Resolved after  treatment  Thank you for allowing me to participate in the care of this patient.   Patient/Family are satisfied with care plan and all questions have been answered.  This document was prepared using Dragon voice recognition software and may include unintentional dictation errors.     Ottie Glazier, M.D.  Division of [place]

## 2021-07-04 NOTE — Assessment & Plan Note (Signed)
-  Continue carvedilol and aspirin -Not on any anticoagulation at home

## 2021-07-04 NOTE — Assessment & Plan Note (Signed)
Mildly elevated procalcitonin.  Sputum cultures with normal respiratory flora.  MRSA swab negative.  Respiratory viral panel negative.  Antibiotics were broadened to cefepime and Zithromax due to worsening hypoxia.  Procalcitonin trending down now. -Continue cefepime and Zithromax-to complete a 5-day course. -Supplemental oxygen to keep the saturation above 90% -Continue with supportive care -Continue Tussionex -specially at night to get a better sleep -Chest PT

## 2021-07-05 ENCOUNTER — Inpatient Hospital Stay: Payer: Medicare PPO

## 2021-07-05 LAB — FUNGITELL, SERUM: Fungitell Result: <31 pg/mL (ref <80)

## 2021-07-05 LAB — CBC
HCT: 32.5 % — ABNORMAL LOW (ref 39.0–52.0)
Hemoglobin: 10.6 g/dL — ABNORMAL LOW (ref 13.0–17.0)
MCH: 28.4 pg (ref 26.0–34.0)
MCHC: 32.6 g/dL (ref 30.0–36.0)
MCV: 87.1 fL (ref 80.0–100.0)
Platelets: 287 10*3/uL (ref 150–400)
RBC: 3.73 MIL/uL — ABNORMAL LOW (ref 4.22–5.81)
RDW: 14 % (ref 11.5–15.5)
WBC: 9.2 10*3/uL (ref 4.0–10.5)
nRBC: 0 % (ref 0.0–0.2)

## 2021-07-05 LAB — BASIC METABOLIC PANEL
Anion gap: 10 (ref 5–15)
BUN: 38 mg/dL — ABNORMAL HIGH (ref 8–23)
CO2: 28 mmol/L (ref 22–32)
Calcium: 8.5 mg/dL — ABNORMAL LOW (ref 8.9–10.3)
Chloride: 99 mmol/L (ref 98–111)
Creatinine, Ser: 1 mg/dL (ref 0.61–1.24)
GFR, Estimated: >60 mL/min (ref >60)
Glucose, Bld: 125 mg/dL — ABNORMAL HIGH (ref 70–99)
Potassium: 3.6 mmol/L (ref 3.5–5.1)
Sodium: 137 mmol/L (ref 135–145)

## 2021-07-05 LAB — CULTURE, BLOOD (ROUTINE X 2)
Culture: NO GROWTH
Culture: NO GROWTH
Special Requests: ADEQUATE
Special Requests: ADEQUATE

## 2021-07-05 LAB — GLUCOSE, CAPILLARY
Glucose-Capillary: 138 mg/dL — ABNORMAL HIGH (ref 70–99)
Glucose-Capillary: 172 mg/dL — ABNORMAL HIGH (ref 70–99)
Glucose-Capillary: 268 mg/dL — ABNORMAL HIGH (ref 70–99)
Glucose-Capillary: 96 mg/dL (ref 70–99)

## 2021-07-05 MED ORDER — FUROSEMIDE 10 MG/ML IJ SOLN
40.0000 mg | Freq: Every day | INTRAMUSCULAR | Status: DC
  Administered 2021-07-06 – 2021-07-07 (×2): 40 mg via INTRAVENOUS
  Filled 2021-07-05 (×2): qty 4

## 2021-07-05 MED ORDER — LISINOPRIL 20 MG PO TABS
40.0000 mg | ORAL_TABLET | Freq: Every day | ORAL | Status: DC
  Administered 2021-07-05 – 2021-07-06 (×2): 40 mg via ORAL
  Filled 2021-07-05 (×2): qty 2

## 2021-07-05 NOTE — Assessment & Plan Note (Signed)
Mildly elevated procalcitonin.  Sputum cultures with normal respiratory flora.  MRSA swab negative.  Respiratory viral panel negative.  Antibiotics were broadened to cefepime and Zithromax due to worsening hypoxia.  Procalcitonin trending down now. -Continue cefepime -to complete a 7-day course. -Completed 5-day course of Zithromax -Supplemental oxygen to keep the saturation above 90% -Continue with supportive care -Continue Tussionex -specially at night to get a better sleep -Chest PT

## 2021-07-05 NOTE — Evaluation (Signed)
Occupational Therapy Evaluation Patient Details Name: Ricardo Nash MRN: [id number] DOB: Sep 01, 1945 Today's Date: 07/05/2021   History of Present Illness Pt is a 76 yo M came in with day history of body aches, chills, cough and congestion starting on his return from a cruise to the [place]. Workup showed PNA, acute respiratory failure. PMH of PAF, DM, HTN.   Clinical Impression   Patient presenting with decreased Ind in self care, balance, functional mobility/transfers, endurance, and safety awareness.  Patient reports living at home with wife independently. He is able to perform self care tasks without assistance and he shares IADL responsibilities with wife. Pt reports being very fatigued and initially sleeping soundly at beginning of session. Patient performs bed mobility with supervision to EOB. Pt's gown wet and he washes UB with set up A and dons clean gown with min A. Pt stands with min A and marches in place x 10 reps each. Several side steps taken to the R to get closer to [facility] with min HHA. Pt returning to supine secondary to fatigue. OT to educate pt on energy conservation next session. Patient will benefit from acute OT to increase overall independence in the areas of ADLs, functional mobility, safety awareness in order to safely discharge home with family.     Recommendations for follow up therapy are one component of a multi-disciplinary discharge planning process, led by the attending physician.  Recommendations may be updated based on patient status, additional functional criteria and insurance authorization.   Follow Up Recommendations  Home health OT    Assistance Recommended at Discharge Intermittent Supervision/Assistance  Patient can return home with the following A little help with walking and/or transfers;A little help with bathing/dressing/bathroom;Assistance with cooking/housework;Assistance with feeding;Help with stairs or ramp for entrance;Assist for  transportation    Functional Status Assessment  Patient has had a recent decline in their functional status and demonstrates the ability to make significant improvements in function in a reasonable and predictable amount of time.  Equipment Recommendations  BSC/3in1;Other (comment) (RW)       Precautions / Restrictions Precautions Precautions: Fall Restrictions Weight Bearing Restrictions: No      Mobility Bed Mobility Overal bed mobility: Needs Assistance Bed Mobility: Supine to Sit, Sit to Supine     Supine to sit: Supervision, HOB elevated     General bed mobility comments: cuing for hand placement and technique    Transfers Overall transfer level: Needs assistance Equipment used: 1 person hand held assist Transfers: Sit to/from Stand, Bed to chair/wheelchair/BSC Sit to Stand: Min assist     Step pivot transfers: Min assist            Balance Overall balance assessment: Needs assistance Sitting-balance support: Feet supported Sitting balance-Leahy Scale: Good       Standing balance-Leahy Scale: Fair Standing balance comment: improved steadiness with RW                           ADL either performed or assessed with clinical judgement   ADL Overall ADL's : Needs assistance/impaired         Upper Body Bathing: Minimal assistance;Sitting   Lower Body Bathing: Minimal assistance;Sit to/from stand   Upper Body Dressing : Minimal assistance;Sitting       Toilet Transfer: Minimal assistance Toilet Transfer Details (indicate cue type and reason): simulated                 Vision Patient Visual Report:  No change from baseline Vision Assessment?: No apparent visual deficits            Pertinent Vitals/Pain Pain Assessment Pain Assessment: No/denies pain     Hand Dominance Right   Extremity/Trunk Assessment Upper Extremity Assessment Upper Extremity Assessment: Generalized weakness   Lower Extremity Assessment Lower  Extremity Assessment: Generalized weakness   Cervical / Trunk Assessment Cervical / Trunk Assessment: Normal   Communication Communication Communication: HOH   Cognition Arousal/Alertness: Awake/alert Behavior During Therapy: WFL for tasks assessed/performed Overall Cognitive Status: Within Functional Limits for tasks assessed                                                  Home Living Family/patient expects to be discharged to:: Private residence Living Arrangements: Spouse/significant other Available Help at Discharge: Family;Available 24 hours/day Type of Home: House Home Access: Stairs to enter [employer] of Steps: 2 Entrance Stairs-Rails: Can reach both Home Layout: One level     Bathroom Shower/Tub: Occupational psychologist: Handicapped height     Home Equipment: Shower seat - built in;Grab bars - tub/shower;Hand held shower head;Cane - single point;Rolling Walker (2 wheels)          Prior Functioning/Environment Prior Level of Function : Independent/Modified Independent                        OT Problem List: Decreased strength;Cardiopulmonary status limiting activity;Decreased activity tolerance;Decreased safety awareness;Impaired balance (sitting and/or standing);Decreased knowledge of use of DME or AE;Decreased knowledge of precautions      OT Treatment/Interventions: Self-care/ADL training;Therapeutic exercise;Therapeutic activities;Modalities;Manual therapy;Balance training;Patient/family education;Energy conservation;DME and/or AE instruction    OT Goals(Current goals can be found in the care plan section) Acute Rehab OT Goals Patient Stated Goal: to go home OT Goal Formulation: With patient Time For Goal Achievement: 07/19/21 Potential to Achieve Goals: Fair ADL Goals Pt Will Perform Grooming: with modified independence;standing Pt Will Perform Lower Body Dressing: with modified independence;sit  to/from stand Pt Will Transfer to Toilet: with modified independence;ambulating Pt Will Perform Toileting - Clothing Manipulation and hygiene: with modified independence;sit to/from stand  OT Frequency: Min 2X/week       AM-PAC OT "6 Clicks" Daily Activity     Outcome Measure Help from another person eating meals?: None Help from another person taking care of personal grooming?: None Help from another person toileting, which includes using toliet, bedpan, or urinal?: A Little Help from another person bathing (including washing, rinsing, drying)?: A Little Help from another person to put on and taking off regular upper body clothing?: None Help from another person to put on and taking off regular lower body clothing?: A Little 6 Click Score: 21   End of Session Equipment Utilized During Treatment: Oxygen Nurse Communication: Mobility status  Activity Tolerance: Patient tolerated treatment well;Patient limited by fatigue Patient left: in bed;with call bell/phone within reach;with bed alarm set;with family/visitor present  OT Visit Diagnosis: Unsteadiness on feet (R26.81);Muscle weakness (generalized) (M62.81)                Time: 1020-1050 OT Time Calculation (min): 30 min Charges:  OT General Charges $OT Visit: 1 Visit OT Evaluation $OT Eval Moderate Complexity: 1 Mod OT Treatments $Therapeutic Activity: 8-22 mins  Darleen Crocker, MS, OTR/L , CBIS ascom [phone]  07/05/21, 3:15  PM

## 2021-07-05 NOTE — Assessment & Plan Note (Signed)
O2 sat 87% on room air with increased work of breathing, initially requiring 4 L of oxygen, worsening hypoxia and currently saturating on 40 L with heated high flow, some decrease in FiO2 to 35%.  No baseline oxygen requirement.  CTA negative for PE.  Clinically seems stable.  Repeat chest x-ray with persistent bilateral infiltrate. Pulmonology was consulted-appreciate their help. -Supplemental O2 to keep sats over 90%-wean as tolerated -Lasix dose was increased by pulmonology to once daily -Continue low-dose steroid -Incentive spirometry and flutter valve

## 2021-07-05 NOTE — Progress Notes (Signed)
Progress Note   Patient: Gabriela Giannelli UVO:[id number] DOB: 10-May-1946 DOA: 06/29/2021     5 DOS: the patient was seen and examined on 07/05/2021   Brief hospital course: Taken from prior notes.  76 year old Caucasian male with a past medical history significant for paroxysmal atrial fibrillation not on chronic anticoagulation, non-insulin-dependent diabetes mellitus type 2, essential hypertension, coronary artery disease on DAPT: Aspirin and Plavix, chronic LBBB.   This patient presented to the emergency department with a 2-day history of body aches, chills, cough and congestion after returning from a cruise to the [place].  He also had a fever of 102 F at home.  Also has shortness of breath.  No chest pain or lower extremity pain/swelling.  Not on oxygen at home.  On arrival he was afebrile, hypoxic on room temperature at mid 80s, initially requiring 4 L of oxygen.  COVID-19 and influenza PCR negative.  Chest x-ray with bilateral patchy infiltrate consistent with bilateral pneumonia.  Respiratory viral panel negative.  CTA was obtained due to worsening hypoxia which was negative for PE but did show multifocal pneumonia.  Currently on cefepime and Zithromax.  Sputum cultures pending. Saturating well on heated high flow at 45 L. 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  Pulmonology was consulted due to worsening hypoxia-they added low-dose steroid.  2/22: Breathing status remained mostly unchanged, remained on heated high flow at 40 L with FiO2 of 55% this morning.  Having bouts of coughing overnight which interfere with his sleep. Pulmonology added Lasix 40 mg twice daily today.  Procalcitonin improving, Fungitell pending.  2/23: Patient appears more comfortable.  Remained on heated high flow at 40 L.  Diuresing well with Lasix with some improvement in creatinine.  Procalcitonin  improving with some worsening of CRP.  2/24: Patient remained on 40 L, FiO2 was decreased to 35%.  Clinically seems improving.  Repeat chest x-ray with persistent bilateral infiltrate.  Pulmonology is on board-appreciate their help. Fungitell still pending   Assessment and Plan: * Acute respiratory failure with hypoxia (HCC) O2 sat 87% on room air with increased work of breathing, initially requiring 4 L of oxygen, worsening hypoxia and currently saturating on 40 L with heated high flow, some decrease in FiO2 to 35%.  No baseline oxygen requirement.  CTA negative for PE.  Clinically seems stable.  Repeat chest x-ray with persistent bilateral infiltrate. Pulmonology was consulted-appreciate their help. -Supplemental O2 to keep sats over 90%-wean as tolerated -Lasix dose was increased by pulmonology to once daily -Continue low-dose steroid -Incentive spirometry and flutter valve  Pneumonia- (present on admission) Mildly elevated procalcitonin.  Sputum cultures with normal respiratory flora.  MRSA swab negative.  Respiratory viral panel negative.  Antibiotics were broadened to cefepime and Zithromax due to worsening hypoxia.  Procalcitonin trending down now. -Continue cefepime -to complete a 7-day course. -Completed 5-day course of Zithromax -Supplemental oxygen to keep the saturation above 90% -Continue with supportive care -Continue Tussionex -specially at night to get a better sleep -Chest PT  Paroxysmal atrial fibrillation ([place])- (present on admission) -Continue carvedilol and aspirin -Not on any anticoagulation at home  Diabetes mellitus (HCC) CBG within goal. -Sliding scale insulin coverage -Continue Semglee 10 units at bedtime  Hypertension- (present on admission)  Blood pressure within goal. -Continue carvedilol, amlodipine, lisinopril and hydrochlorothiazide  CAD (coronary artery disease), native coronary artery- (present on admission)  No chest pain. -Continue aspirin,  atorvastatin and  carvedilol and lisinopril  GERD (gastroesophageal reflux disease)- (present on admission) - Continue Protonix  Obstructive sleep apnea- (present on admission) Continue with CPAP at night which he uses at home once hypoxia improves   Subjective: Patient was resting comfortably when seen today.  Wife at bedside.  No new complaints.  Clinically seems improving but oxygen requirement remained at 40 L  Physical Exam: Vitals:   07/05/21 0709 07/05/21 0729 07/05/21 1238 07/05/21 1418  BP:  (!) 154/60 137/71   Pulse:  82 74   Resp:  (!) 25 19   Temp:  98.9 F (37.2 C) 99 F (37.2 C)   TempSrc:   Axillary   SpO2: 90% 95% 94% 90%  Weight:      Height:       General.  Well-developed elderly man, in no acute distress. Pulmonary.coarse breath sounds bilaterally, normal respiratory effort. CV.  Regular rate and rhythm, no JVD, rub or murmur. Abdomen.  Soft, nontender, nondistended, BS positive. CNS.  Alert and oriented .  No focal neurologic deficit. Extremities.  No edema, no cyanosis, pulses intact and symmetrical. Psychiatry.  Judgment and insight appears normal.  Data Reviewed: I reviewed prior notes and labs.  Family Communication: Discussed with wife at bedside  Disposition: Status is: Inpatient Remains inpatient appropriate because: Severity of illness   Planned Discharge Destination: Home with Home Health  DVT prophylaxis.  Lovenox  Time spent: 45 minutes  This record has been created using Systems analyst. Errors have been sought and corrected,but may not always be located. Such creation errors do not reflect on the standard of care.  Author: Lorella Nimrod, MD 07/05/2021 4:31 PM  For on call review [email or web address].

## 2021-07-05 NOTE — Progress Notes (Signed)
Mobility Specialist - Progress Note   07/05/21 1600  Mobility  Activity Ambulated with assistance in room;Dangled on edge of bed;Stood at bedside  Level of Assistance Contact guard assist, steadying assist  Assistive Device None  Distance Ambulated (ft) 8 ft  Activity Response Tolerated well  $Mobility charge 1 Mobility     Pre-mobility: 96 HR, 95% SpO2 During mobility: 117 HR, 90% SpO2 Post-mobility: 80 HR, 97% SpO2   Pt dangling EOB on arrival, utilizing 38% FiO2. Pt performed 3 STS with supervision, and progressed to ambulating in room with CGA. No LOB, mildly unsteady. Mild SOB, O2 maintained >/= 90%. Pt left EOB with family at bedside. No complaints.    Kathee Delton Mobility Specialist 07/05/21, 4:48 PM

## 2021-07-05 NOTE — Progress Notes (Signed)
PULMONOLOGY         Date: 07/05/2021,   MRN# [id number] Ricardo Nash [facility] 19-Jan-1946     AdmissionWeight: 92.1 kg                 CurrentWeight: 92.2 kg   Referring physician: Dr Reesa Chew   CHIEF COMPLAINT:   Acute hypoxemic respiratory failure   HISTORY OF PRESENT ILLNESS   This is a 76 yo M with hx of PAF, dm htn came in with day history of body aches, chills, cough and congestion starting on his return from a cruise to the [place]. He also had a fever of 102. He has associated shortness of breath.  States his symptoms are similar to when he had pneumonia sometime ago.  arrival afebrile with pulse 87, BP 142/80, O2 sat 87% on room air improving to 94% on 4 L.  COVID is negative.   He had CT chest which I reviewed independently and is with findings of multifocal bilateral pneumonia.   PCCM consult for severe acute hypoxemic respiratory failure  07/03/21-  patient is on 60L/min HFNC with 45L/min, CRP is trending down with steroids. PCT is trending down with antibiotics.  He is no better today clinically.  He is reciving narcotics and xanax which may hinder his respiratory status and contribute to shallow breathing.  07/04/21-  patient is slightly improved.  He is able to speak now and is on less oxygen via HFNC.   07/05/21- patient imrpoved with good UOP on bid lasix , will reduce to once daily now. He had reduction of steroids to 20mg  prednisone quickly from IV 40 solumedrol with elevation of CRP, we can try holding this dose at 20 for now. CXR ordered for repeat today.  He is doing PT/OT   PAST MEDICAL HISTORY   Past Medical History:  Diagnosis Date   C. difficile colitis    Coronary artery disease    Depression    Diabetes mellitus ([place])    Diverticulitis    Diverticulosis    Dysrhythmia    PAF   Environmental allergies    GERD (gastroesophageal reflux disease)    Goiter    intrathoracic, s/p benign biopsy (Dr Harlow Asa)   Hypercholesterolemia     Hypertension    Osteoarthritis    cervical spine, lumbar spine   Paroxysmal atrial fibrillation (HCC)    Pneumonia    PONV (postoperative nausea and vomiting)    Sleep apnea    CPAP @ NIGHT   Stroke [facility])    TIA (transient ischemic attack)      SURGICAL HISTORY   Past Surgical History:  Procedure Laterality Date   BACK SURGERY  8/08   s/p fusion of L4-L5   capsule endoscopy     CATARACT EXTRACTION  2011   Dr. Herbert Deaner   CERVICAL DISC SURGERY  2002   COLONOSCOPY WITH PROPOFOL N/A 07/20/2017   Procedure: COLONOSCOPY WITH PROPOFOL;  Surgeon: Manya Silvas, MD;  Location: [facility] ENDOSCOPY;  Service: Endoscopy;  Laterality: N/A;   EYE SURGERY     cataract bilateral 10/1999   eyelid reduction     Dr. Kerby Less HERNIA REPAIR  1991   Dr, Cassell Clement LAMINECTOMY/DECOMPRESSION MICRODISCECTOMY N/A 10/15/2020   Procedure: Laminectomy - Lumbar Two-Lumbar Three - Lumbar Three-Lumbar Four with sublaminar decompression;  Surgeon: Eustace Moore, MD;  Location: [place];  Service: Neurosurgery;  Laterality: N/A;  Laminectomy - Lumbar Two-Lumbar Three - Lumbar Three-Lumbar Four  with sublaminar decompression   NOSE SURGERY     turbinate reduction   SEPTOPLASTY  1975   SHOULDER SURGERY  2000   rotator cuff     FAMILY HISTORY   Family History  Problem Relation Age of Onset   Congestive Heart Failure Father    Heart disease Father        myocardial infarction   Rheumatic fever Father        valvular disease   Heart disease Mother        s/p CABG (age 51)   Kidney disease Sister    Colon cancer Neg Hx    Prostate cancer Neg Hx      SOCIAL HISTORY   Social History   Tobacco Use   Smoking status: Former   Smokeless tobacco: Never  Scientific laboratory technician Use: Never used  Substance Use Topics   Alcohol use: Yes    Alcohol/week: 0.0 standard drinks    Comment: occasional   Drug use: No     MEDICATIONS    Home Medication:    Current  Medication:  Current Facility-Administered Medications:    acetaminophen (TYLENOL) tablet 650 mg, 650 mg, Oral, Q6H PRN, 650 mg at 07/05/21 0655 **OR** acetaminophen (TYLENOL) suppository 650 mg, 650 mg, Rectal, Q6H PRN, Athena Masse, MD   albuterol (PROVENTIL) (2.5 MG/3ML) 0.083% nebulizer solution 2.5 mg, 2.5 mg, Nebulization, Q2H PRN, Foust, Katy L, NP, 2.5 mg at 07/03/21 0452   ALPRAZolam (XANAX) tablet 0.5 mg, 0.5 mg, Oral, TID PRN, Renda Rolls, RPH, 0.5 mg at 07/04/21 2147   aspirin EC tablet 81 mg, 81 mg, Oral, QPM, Athena Masse, MD, 81 mg at 07/04/21 1757   atorvastatin (LIPITOR) tablet 20 mg, 20 mg, Oral, Daily, Judd Gaudier V, MD, 20 mg at 07/05/21 0847   carvedilol (COREG) tablet 25 mg, 25 mg, Oral, BID WC, Judd Gaudier V, MD, 25 mg at 07/05/21 0847   ceFEPIme (MAXIPIME) 2 g in sodium chloride 0.9 % 100 mL IVPB, 2 g, Intravenous, Q12H, Lorella Nimrod, MD, Last Rate: 200 mL/hr at 07/05/21 0901, 2 g at 07/05/21 0901   chlorpheniramine-HYDROcodone 10-8 MG/5ML suspension 5 mL, 5 mL, Oral, Q12H PRN, Lorella Nimrod, MD, 5 mL at 07/04/21 2149   enoxaparin (LOVENOX) injection 40 mg, 40 mg, Subcutaneous, Q24H, Judd Gaudier V, MD, 40 mg at 07/05/21 0848   fluticasone (FLONASE) 50 MCG/ACT nasal spray 2 spray, 2 spray, Each Nare, Daily, Dorothe Pea, RPH, 2 spray at 07/05/21 0848   [START ON 07/06/2021] furosemide (LASIX) injection 40 mg, 40 mg, Intravenous, Daily, Lanney Gins, Sharlett Lienemann, MD   guaiFENesin (MUCINEX) 12 hr tablet 1,200 mg, 1,200 mg, Oral, BID, Anwar, Shayan S, DO, 1,200 mg at 07/05/21 0847   hydrALAZINE (APRESOLINE) injection 10 mg, 10 mg, Intravenous, Q8H PRN, Lorella Nimrod, MD   insulin aspart (novoLOG) injection 0-15 Units, 0-15 Units, Subcutaneous, TID WC, Judd Gaudier V, MD, 3 Units at 07/05/21 1156   insulin glargine-yfgn (SEMGLEE) injection 10 Units, 10 Units, Subcutaneous, QHS, Amin, Soundra Pilon, MD, 10 Units at 07/04/21 2149   ipratropium-albuterol  (DUONEB) 0.5-2.5 (3) MG/3ML nebulizer solution 3 mL, 3 mL, Nebulization, Q6H, Anwar, Shayan S, DO, 3 mL at 07/05/21 0709   loratadine (CLARITIN) tablet 10 mg, 10 mg, Oral, Daily, Dorothe Pea, RPH, 10 mg at 07/05/21 0847   MEDLINE mouth rinse, 15 mL, Mouth Rinse, BID, Judd Gaudier V, MD, 15 mL at 07/05/21 0849   menthol-cetylpyridinium (CEPACOL) lozenge 3 mg, 1  lozenge, Oral, PRN, Foust, Katy L, NP, 3 mg at 07/04/21 1246   metFORMIN (GLUCOPHAGE) tablet 1,000 mg, 1,000 mg, Oral, BID WC, Doristine Mango L, MD, 1,000 mg at 07/05/21 0846   ondansetron (ZOFRAN) tablet 4 mg, 4 mg, Oral, Q6H PRN **OR** ondansetron (ZOFRAN) injection 4 mg, 4 mg, Intravenous, Q6H PRN, Athena Masse, MD, 4 mg at 06/30/21 1112   pantoprazole (PROTONIX) EC tablet 40 mg, 40 mg, Oral, Daily, Richarda Osmond, MD, 40 mg at 07/05/21 0847   pioglitazone (ACTOS) tablet 15 mg, 15 mg, Oral, Daily, Richarda Osmond, MD, 15 mg at 07/05/21 0846   predniSONE (DELTASONE) tablet 20 mg, 20 mg, Oral, Q breakfast, Lorella Nimrod, MD, 20 mg at 07/05/21 0847   sertraline (ZOLOFT) tablet 100 mg, 100 mg, Oral, Daily, Richarda Osmond, MD, 100 mg at 07/05/21 0847   tamsulosin (FLOMAX) capsule 0.4 mg, 0.4 mg, Oral, Q breakfast, Richarda Osmond, MD, 0.4 mg at 07/05/21 0847   traZODone (DESYREL) tablet 75 mg, 75 mg, Oral, QHS, Richarda Osmond, MD, 75 mg at 07/04/21 2147    ALLERGIES   Penicillins     REVIEW OF SYSTEMS    Review of Systems:  Gen:  Denies  fever, sweats, chills weigh loss  HEENT: Denies blurred vision, double vision, ear pain, eye pain, hearing loss, nose bleeds, sore throat Cardiac:  No dizziness, chest pain or heaviness, chest tightness,edema Resp:   Denies cough or sputum porduction, shortness of breath,wheezing, hemoptysis,  Gi: Denies swallowing difficulty, stomach pain, nausea or vomiting, diarrhea, constipation, bowel incontinence Gu:  Denies bladder incontinence, burning  urine Ext:   Denies Joint pain, stiffness or swelling Skin: Denies  skin rash, easy bruising or bleeding or hives Endoc:  Denies polyuria, polydipsia , polyphagia or weight change Psych:   Denies depression, insomnia or hallucinations   Other:  All other systems negative   VS: BP (!) 154/60 (BP Location: Right Arm)    Pulse 82    Temp 98.9 F (37.2 C)    Resp (!) 25    Ht 5\' 9"  (1.753 m)    Wt 92.2 kg    SpO2 95%    BMI 30.02 kg/m      PHYSICAL EXAM    GENERAL:NAD, no fevers, chills, no weakness no fatigue HEAD: Normocephalic, atraumatic.  EYES: Pupils equal, round, reactive to light. Extraocular muscles intact. No scleral icterus.  MOUTH: Moist mucosal membrane. Dentition intact. No abscess noted.  EAR, NOSE, THROAT: Clear without exudates. No external lesions.  NECK: Supple. No thyromegaly. No nodules. No JVD.  PULMONARY: bilateral rhonchi  CARDIOVASCULAR: S1 and S2. Regular rate and rhythm. No murmurs, rubs, or gallops. No edema. Pedal pulses 2+ bilaterally.  GASTROINTESTINAL: Soft, nontender, nondistended. No masses. Positive bowel sounds. No hepatosplenomegaly.  MUSCULOSKELETAL: No swelling, clubbing, or edema. Range of motion full in all extremities.  NEUROLOGIC: Cranial nerves II through XII are intact. No gross focal neurological deficits. Sensation intact. Reflexes intact.  SKIN: No ulceration, lesions, rashes, or cyanosis. Skin warm and dry. Turgor intact.  PSYCHIATRIC: Mood, affect within normal limits. The patient is awake, alert and oriented x 3. Insight, judgment intact.       IMAGING    DG Chest 2 View  Result Date: 06/29/2021 CLINICAL DATA:  Shortness of breath. Chest congestion, cough and body aches. EXAM: CHEST - 2 VIEW COMPARISON:  10/11/2020 FINDINGS: Heart size upper limits of normal. Aortic atherosclerosis as seen previously. Bronchial thickening with patchy infiltrates in both  lower lobes consistent with bronchopneumonia. No effusion. No lobar  consolidation or collapse. Chronic compression deformities of the thoracolumbar junction region as seen previously. IMPRESSION: Bilateral lower lobe bronchitis and patchy bronchopneumonia. Electronically Signed   By: Nelson Chimes M.D.   On: 06/29/2021 22:25   CT Angio Chest Pulmonary Embolism (PE) W or WO Contrast  Result Date: 07/01/2021 CLINICAL DATA:  Pulmonary embolism (PE) suspected, unknown D-dimer EXAM: CT ANGIOGRAPHY CHEST WITH CONTRAST TECHNIQUE: Multidetector CT imaging of the chest was performed using the standard protocol during bolus administration of intravenous contrast. Multiplanar CT image reconstructions and MIPs were obtained to evaluate the vascular anatomy. RADIATION DOSE REDUCTION: This exam was performed according to the departmental dose-optimization program which includes automated exposure control, adjustment of the mA and/or kV according to patient size and/or use of iterative reconstruction technique. CONTRAST:  54mL OMNIPAQUE IOHEXOL 350 MG/ML SOLN COMPARISON:  Radiographs 07/01/2021, 06/29/2021, 10/11/2020 FINDINGS: Cardiovascular: Respiratory motion artifact limits evaluation of the segmental and subsegmental vessels. There is no evidence of pulmonary embolism to the level of the lobar arteries. Mild cardiomegaly. No pericardial disease. Coronary artery calcifications. Mild atherosclerotic calcifications of the aortic arch and descending aorta. Mediastinum/Nodes: There are a few prominent mediastinal and hilar lymph nodes, likely reactive. Enlarged, multinodular goiter with mediastinal extension, increased in size in comparison due prior chest CT and March 2012. Lungs/Pleura: High there is multifocal consolidation and ground-glass opacities bilaterally, most confluent in the lower lobes and right middle lobe into a slightly lesser degree in the upper lobes. Small bilateral pleural effusions. No pneumothorax. Upper Abdomen: Airways are patent. Musculoskeletal: Chronic anterior  compression fracture of T12. No acute osseous abnormality. Diffuse idiopathic skeletal hyperostosis. Review of the MIP images confirms the above findings. IMPRESSION: Multifocal pneumonia.  Small bilateral pleural effusions. Respiratory motion artifact limits evaluation of the segmental and subsegmental pulmonary arteries. Within this limitation there is no evidence of pulmonary embolism to the level of the lobar arteries. Multinodular goiter with intrathoracic extension, increased in size in comparison to prior chest CT in March 2012. Aortic Atherosclerosis (ICD10-I70.0). Electronically Signed   By: Maurine Simmering M.D.   On: 07/01/2021 10:04   DG Chest Port 1 View  Result Date: 07/01/2021 CLINICAL DATA:  Dyspnea. EXAM: PORTABLE CHEST 1 VIEW COMPARISON:  06/29/2021 FINDINGS: Heart size is normal. Interval development of bilateral LOWER lobe infiltrates a RIGHT slightly more confluent than the LEFT. IMPRESSION: New bilateral LOWER lobe infiltrates. Electronically Signed   By: Nolon Nations M.D.   On: 07/01/2021 08:55   ECHOCARDIOGRAM COMPLETE  Result Date: 07/02/2021    ECHOCARDIOGRAM REPORT   Patient Name:   TAVONTE SEYBOLD Date of Exam: 07/01/2021 Medical Rec #:  [id number]          Height:       69.0 in Accession #:    [id number]         Weight:       203.0 lb Date of Birth:  1945-06-06          BSA:          2.079 m Patient Age:    31 years           BP:           155/60 mmHg Patient Gender: M                  HR:           82 bpm. Exam Location:  [facility] Procedure: 2D Echo,  Color Doppler and Cardiac Doppler Indications:     R06.03 Acute respiratory distress  History:         Patient has no prior history of Echocardiogram examinations.                  CAD, Stroke; Risk Factors:Hypertension, Sleep Apnea, HCL and                  Diabetes.  Sonographer:     Charmayne Sheer Referring Phys:  [id number] Richarda Osmond Diagnosing Phys: Yolonda Kida MD IMPRESSIONS  1. Left ventricular ejection fraction, by  estimation, is 40 to 45%. The left ventricle has mildly decreased function. The left ventricle demonstrates global hypokinesis. The left ventricular internal cavity size was mildly to moderately dilated. Left ventricular diastolic parameters were normal.  2. Right ventricular systolic function is low normal. The right ventricular size is mildly enlarged. Mildly increased right ventricular wall thickness.  3. The mitral valve is grossly normal. Trivial mitral valve regurgitation.  4. The aortic valve is grossly normal. Aortic valve regurgitation is not visualized. FINDINGS  Left Ventricle: Left ventricular ejection fraction, by estimation, is 40 to 45%. The left ventricle has mildly decreased function. The left ventricle demonstrates global hypokinesis. The left ventricular internal cavity size was mildly to moderately dilated. There is no left ventricular hypertrophy. Abnormal (paradoxical) septal motion, consistent with left bundle branch block. Left ventricular diastolic parameters were normal. Right Ventricle: The right ventricular size is mildly enlarged. Mildly increased right ventricular wall thickness. Right ventricular systolic function is low normal. Left Atrium: Left atrial size was normal in size. Right Atrium: Right atrial size was normal in size. Pericardium: There is no evidence of pericardial effusion. Mitral Valve: The mitral valve is grossly normal. Trivial mitral valve regurgitation. MV peak gradient, 4.5 mmHg. The mean mitral valve gradient is 3.0 mmHg. Tricuspid Valve: The tricuspid valve is grossly normal. Tricuspid valve regurgitation is trivial. Aortic Valve: The aortic valve is grossly normal. Aortic valve regurgitation is not visualized. Aortic valve mean gradient measures 5.0 mmHg. Aortic valve peak gradient measures 9.4 mmHg. Aortic valve area, by VTI measures 1.95 cm. Pulmonic Valve: The pulmonic valve was normal in structure. Pulmonic valve regurgitation is not visualized. Aorta: The  ascending aorta was not well visualized. IAS/Shunts: No atrial level shunt detected by color flow Doppler.  LEFT VENTRICLE PLAX 2D LVIDd:         5.17 cm   Diastology LVIDs:         3.99 cm   LV e' medial:    6.20 cm/s LV PW:         0.98 cm   LV E/e' medial:  14.6 LV IVS:        0.87 cm   LV e' lateral:   6.96 cm/s LVOT diam:     2.00 cm   LV E/e' lateral: 13.0 LV SV:         54 LV SV Index:   26 LVOT Area:     3.14 cm  RIGHT VENTRICLE RV Basal diam:  3.54 cm LEFT ATRIUM             Index        RIGHT ATRIUM           Index LA diam:        4.50 cm 2.16 cm/m   RA Area:     11.20 cm LA Vol (A2C):   28.3 ml 13.61 ml/m  RA  Volume:   19.20 ml  9.23 ml/m LA Vol (A4C):   67.0 ml 32.23 ml/m LA Biplane Vol: 46.6 ml 22.41 ml/m  AORTIC VALVE                    PULMONIC VALVE AV Area (Vmax):    1.90 cm     PV Vmax:          1.27 m/s AV Area (Vmean):   2.04 cm     PV Vmean:         82.700 cm/s AV Area (VTI):     1.95 cm     PV VTI:           0.208 m AV Vmax:           153.00 cm/s  PV Peak grad:     6.5 mmHg AV Vmean:          97.400 cm/s  PV Mean grad:     3.0 mmHg AV VTI:            0.275 m      PR End Diast Vel: 3.05 msec AV Peak Grad:      9.4 mmHg AV Mean Grad:      5.0 mmHg LVOT Vmax:         92.70 cm/s LVOT Vmean:        63.200 cm/s LVOT VTI:          0.171 m LVOT/AV VTI ratio: 0.62  AORTA Ao Root diam: 3.10 cm MITRAL VALVE MV Area (PHT): 4.86 cm    SHUNTS MV Area VTI:   1.77 cm    Systemic VTI:  0.17 m MV Peak grad:  4.5 mmHg    Systemic Diam: 2.00 cm MV Mean grad:  3.0 mmHg MV Vmax:       1.06 m/s MV Vmean:      80.7 cm/s MV Decel Time: 156 msec MV E velocity: 90.80 cm/s MV A velocity: 90.80 cm/s MV E/A ratio:  1.00 Dwayne Prince Rome MD Electronically signed by Yolonda Kida MD Signature Date/Time: 07/02/2021/3:46:32 PM    Final       ASSESSMENT/PLAN   Acute hypoxemic respiratory failure.   Patienthas multifocal pneumonia  - RVP is negative  - he shares recurrent pneumonia   - denies mold or  fungal exposure or history   - Rocephin and zithromax    -Procalcitonin is mildly elevated continue antibiotics  -MRSA PCR is negative -prednisone at 20mg  , CRP increased may need to go back up and reduce slowly   Acute on chronic systolic CHF S/p TTE with global hypokinesis - lasix 40 bid for 48h more     Acute kidney injury - KDIGO 2 -due to acutely ill status with pneumonia and possible ATN  -Resolved after treatment  Thank you for allowing me to participate in the care of this patient.   Patient/Family are satisfied with care plan and all questions have been answered.  This document was prepared using Dragon voice recognition software and may include unintentional dictation errors.     Ottie Glazier, M.D.  Division of [facility]

## 2021-07-06 LAB — BASIC METABOLIC PANEL
Anion gap: 12 (ref 5–15)
BUN: 34 mg/dL — ABNORMAL HIGH (ref 8–23)
CO2: 29 mmol/L (ref 22–32)
Calcium: 8.4 mg/dL — ABNORMAL LOW (ref 8.9–10.3)
Chloride: 99 mmol/L (ref 98–111)
Creatinine, Ser: 0.97 mg/dL (ref 0.61–1.24)
GFR, Estimated: >60 mL/min (ref >60)
Glucose, Bld: 118 mg/dL — ABNORMAL HIGH (ref 70–99)
Potassium: 3.5 mmol/L (ref 3.5–5.1)
Sodium: 140 mmol/L (ref 135–145)

## 2021-07-06 LAB — GLUCOSE, CAPILLARY
Glucose-Capillary: 151 mg/dL — ABNORMAL HIGH (ref 70–99)
Glucose-Capillary: 157 mg/dL — ABNORMAL HIGH (ref 70–99)
Glucose-Capillary: 179 mg/dL — ABNORMAL HIGH (ref 70–99)
Glucose-Capillary: 159 mg/dL — ABNORMAL HIGH (ref 70–99)

## 2021-07-06 LAB — C-REACTIVE PROTEIN: CRP: 12.3 mg/dL — ABNORMAL HIGH (ref <1.0)

## 2021-07-06 MED ORDER — TRIAMTERENE-HCTZ 37.5-25 MG PO TABS
2.0000 | ORAL_TABLET | Freq: Every day | ORAL | Status: DC
Start: 2021-07-06 — End: 2021-07-08
  Administered 2021-07-06 – 2021-07-08 (×3): 2 via ORAL
  Filled 2021-07-06 (×3): qty 2

## 2021-07-06 MED ORDER — IPRATROPIUM-ALBUTEROL 0.5-2.5 (3) MG/3ML IN SOLN
3.0000 mL | Freq: Three times a day (TID) | RESPIRATORY_TRACT | Status: DC
  Administered 2021-07-06 – 2021-07-08 (×5): 3 mL via RESPIRATORY_TRACT
  Filled 2021-07-06 (×5): qty 3

## 2021-07-06 NOTE — Progress Notes (Signed)
SLP F/U Note  Patient Details Name: Orman Matsumura MRN: [id number] DOB: [birth year]   Cancelled treatment:       Reason Eval/Treat Not Completed: SLP screened, no needs identified, will sign off (chart reviewed; consulted NSG/MD then met w/ pt and WIfe) Pt is improving daily now per MD. Pt is weaned to [place] o2 support; off HFNC. Multifocal pneumonia improving; new meds/antibiotics added. Pt is using his incentive spirometer and recalled need for general aspiration precautions w/ any oral intake; Wife stated they were following recommendations. Encouraged more support behind Lower back; pt agreed. No further skilled ST services indicated at this time; MD to reconsult if new concern or further needs while admitted.     Orinda Kenner, MS, CCC-SLP Speech Language Pathologist Rehab Services; [place] [phone] (ascom) Ayjah Show 07/06/2021, 3:45 PM

## 2021-07-06 NOTE — Progress Notes (Signed)
PULMONOLOGY         Date: 07/06/2021,   MRN# [id number] Ricardo Nash [facility] 15-Nov-1945     AdmissionWeight: 92.1 kg                 CurrentWeight: 92.2 kg   Referring physician: Dr Reesa Chew   CHIEF COMPLAINT:   Acute hypoxemic respiratory failure   HISTORY OF PRESENT ILLNESS   This is a 76 yo M with hx of PAF, dm htn came in with day history of body aches, chills, cough and congestion starting on his return from a cruise to the [place]. He also had a fever of 102. He has associated shortness of breath.  States his symptoms are similar to when he had pneumonia sometime ago.  arrival afebrile with pulse 87, BP 142/80, O2 sat 87% on room air improving to 94% on 4 L.  COVID is negative.   He had CT chest which I reviewed independently and is with findings of multifocal bilateral pneumonia.   PCCM consult for severe acute hypoxemic respiratory failure  07/03/21-  patient is on 60L/min HFNC with 45L/min, CRP is trending down with steroids. PCT is trending down with antibiotics.  He is no better today clinically.  He is reciving narcotics and xanax which may hinder his respiratory status and contribute to shallow breathing.  07/05/21- patient is improved, he is negative 5 liters and CRP is coming down nicely.   Patient does have dry cough he likely has this from pna but id like to remove lisinopril and exchange for maxizide for additional diuresis and antihypertensive therapy with hopefully less cough.   PAST MEDICAL HISTORY   Past Medical History:  Diagnosis Date   C. difficile colitis    Coronary artery disease    Depression    Diabetes mellitus ([place])    Diverticulitis    Diverticulosis    Dysrhythmia    PAF   Environmental allergies    GERD (gastroesophageal reflux disease)    Goiter    intrathoracic, s/p benign biopsy (Dr Harlow Asa)   Hypercholesterolemia    Hypertension    Osteoarthritis    cervical spine, lumbar spine   Paroxysmal atrial fibrillation  (HCC)    Pneumonia    PONV (postoperative nausea and vomiting)    Sleep apnea    CPAP @ NIGHT   Stroke [facility])    TIA (transient ischemic attack)      SURGICAL HISTORY   Past Surgical History:  Procedure Laterality Date   BACK SURGERY  8/08   s/p fusion of L4-L5   capsule endoscopy     CATARACT EXTRACTION  2011   Dr. Herbert Deaner   CERVICAL DISC SURGERY  2002   COLONOSCOPY WITH PROPOFOL N/A 07/20/2017   Procedure: COLONOSCOPY WITH PROPOFOL;  Surgeon: Manya Silvas, MD;  [facility] END[facility]COPY;  Service: Endoscopy;  Laterality: N/A;   EYE SURGERY     cataract bilateral 10/1999   eyelid reduction     Dr. Kerby Less HERNIA REPAIR  1991   Dr, Cassell Clement LAMINECTOMY/DECOMPRESSION MICRODISCECTOMY N/A 10/15/2020   Procedure: Laminectomy - Lumbar Two-Lumbar Three - Lumbar Three-Lumbar Four with sublaminar decompression;  Surgeon: Eustace Moore, MD;  Location: [place];  Service: Neurosurgery;  Laterality: N/A;  Laminectomy - Lumbar Two-Lumbar Three - Lumbar Three-Lumbar Four with sublaminar decompression   NOSE SURGERY     turbinate reduction   SEPTOPLASTY  1975   SHOULDER SURGERY  2000   rotator  cuff     FAMILY HISTORY   Family History  Problem Relation Age of Onset   Congestive Heart Failure Father    Heart disease Father        myocardial infarction   Rheumatic fever Father        valvular disease   Heart disease Mother        s/p CABG (age 90)   Kidney disease Sister    Colon cancer Neg Hx    Prostate cancer Neg Hx      SOCIAL HISTORY   Social History   Tobacco Use   Smoking status: Former   Smokeless tobacco: Never  Scientific laboratory technician Use: Never used  Substance Use Topics   Alcohol use: Yes    Alcohol/week: 0.0 standard drinks    Comment: occasional   Drug use: No     MEDICATIONS    Home Medication:    Current Medication:  Current Facility-Administered Medications:    acetaminophen (TYLENOL) tablet 650 mg,  650 mg, Oral, Q6H PRN, 650 mg at 07/05/21 0655 **OR** acetaminophen (TYLENOL) suppository 650 mg, 650 mg, Rectal, Q6H PRN, Athena Masse, MD   albuterol (PROVENTIL) (2.5 MG/3ML) 0.083% nebulizer solution 2.5 mg, 2.5 mg, Nebulization, Q2H PRN, Foust, Katy L, NP, 2.5 mg at 07/05/21 1845   ALPRAZolam (XANAX) tablet 0.5 mg, 0.5 mg, Oral, TID PRN, Renda Rolls, RPH, 0.5 mg at 07/04/21 2147   aspirin EC tablet 81 mg, 81 mg, Oral, QPM, Athena Masse, MD, 81 mg at 07/05/21 1733   atorvastatin (LIPITOR) tablet 20 mg, 20 mg, Oral, Daily, Judd Gaudier V, MD, 20 mg at 07/06/21 3007   carvedilol (COREG) tablet 25 mg, 25 mg, Oral, BID WC, Judd Gaudier V, MD, 25 mg at 07/06/21 6226   ceFEPIme (MAXIPIME) 2 g in sodium chloride 0.9 % 100 mL IVPB, 2 g, Intravenous, Q12H, Lorella Nimrod, MD, Last Rate: 200 mL/hr at 07/06/21 0835, 2 g at 07/06/21 0835   chlorpheniramine-HYDROcodone 10-8 MG/5ML suspension 5 mL, 5 mL, Oral, Q12H PRN, Lorella Nimrod, MD, 5 mL at 07/05/21 1829   enoxaparin (LOVENOX) injection 40 mg, 40 mg, Subcutaneous, Q24H, Judd Gaudier V, MD, 40 mg at 07/06/21 0832   fluticasone (FLONASE) 50 MCG/ACT nasal spray 2 spray, 2 spray, Each Nare, Daily, Dorothe Pea, RPH, 2 spray at 07/06/21 0834   furosemide (LASIX) injection 40 mg, 40 mg, Intravenous, Daily, Ottie Glazier, MD, 40 mg at 07/06/21 0833   guaiFENesin (MUCINEX) 12 hr tablet 1,200 mg, 1,200 mg, Oral, BID, Anwar, Shayan S, DO, 1,200 mg at 07/06/21 3335   hydrALAZINE (APRESOLINE) injection 10 mg, 10 mg, Intravenous, Q8H PRN, Lorella Nimrod, MD   insulin aspart (novoLOG) injection 0-15 Units, 0-15 Units, Subcutaneous, TID WC, Judd Gaudier V, MD, 3 Units at 07/06/21 1146   insulin glargine-yfgn (SEMGLEE) injection 10 Units, 10 Units, Subcutaneous, QHS, Amin, Soundra Pilon, MD, 10 Units at 07/04/21 2149   ipratropium-albuterol (DUONEB) 0.5-2.5 (3) MG/3ML nebulizer solution 3 mL, 3 mL, Nebulization, Q6H, Anwar, Shayan S, DO, 3 mL at  07/06/21 1323   lisinopril (ZESTRIL) tablet 40 mg, 40 mg, Oral, Daily, Amin, Soundra Pilon, MD, 40 mg at 07/06/21 0833   loratadine (CLARITIN) tablet 10 mg, 10 mg, Oral, Daily, Dorothe Pea, RPH, 10 mg at 07/06/21 4562   MEDLINE mouth rinse, 15 mL, Mouth Rinse, BID, Judd Gaudier V, MD, 15 mL at 07/06/21 0835   menthol-cetylpyridinium (CEPACOL) lozenge 3 mg, 1 lozenge, Oral, PRN, Foust, Katy L,  NP, 3 mg at 07/05/21 1827   metFORMIN (GLUCOPHAGE) tablet 1,000 mg, 1,000 mg, Oral, BID WC, Richarda Osmond, MD, 1,000 mg at 07/06/21 0833   ondansetron (ZOFRAN) tablet 4 mg, 4 mg, Oral, Q6H PRN **OR** ondansetron (ZOFRAN) injection 4 mg, 4 mg, Intravenous, Q6H PRN, Athena Masse, MD, 4 mg at 06/30/21 1112   pantoprazole (PROTONIX) EC tablet 40 mg, 40 mg, Oral, Daily, Richarda Osmond, MD, 40 mg at 07/06/21 8295   pioglitazone (ACTOS) tablet 15 mg, 15 mg, Oral, Daily, Richarda Osmond, MD, 15 mg at 07/06/21 6213   predniSONE (DELTASONE) tablet 20 mg, 20 mg, Oral, Q breakfast, Lorella Nimrod, MD, 20 mg at 07/06/21 0865   sertraline (ZOLOFT) tablet 100 mg, 100 mg, Oral, Daily, Richarda Osmond, MD, 100 mg at 07/06/21 7846   tamsulosin (FLOMAX) capsule 0.4 mg, 0.4 mg, Oral, Q breakfast, Richarda Osmond, MD, 0.4 mg at 07/06/21 9629   traZODone (DESYREL) tablet 75 mg, 75 mg, Oral, QHS, Richarda Osmond, MD, 75 mg at 07/05/21 2109    ALLERGIES   Penicillins     REVIEW OF SYSTEMS    Review of Systems:  Gen:  Denies  fever, sweats, chills weigh loss  HEENT: Denies blurred vision, double vision, ear pain, eye pain, hearing loss, nose bleeds, sore throat Cardiac:  No dizziness, chest pain or heaviness, chest tightness,edema Resp:   Denies cough or sputum porduction, shortness of breath,wheezing, hemoptysis,  Gi: Denies swallowing difficulty, stomach pain, nausea or vomiting, diarrhea, constipation, bowel incontinence Gu:  Denies bladder incontinence, burning urine Ext:    Denies Joint pain, stiffness or swelling Skin: Denies  skin rash, easy bruising or bleeding or hives Endoc:  Denies polyuria, polydipsia , polyphagia or weight change Psych:   Denies depression, insomnia or hallucinations   Other:  All other systems negative   VS: BP (!) 142/51 (BP Location: Right Arm)    Pulse 77    Temp 98 F (36.7 C)    Resp 19    Ht 5\' 9"  (1.753 m)    Wt 92.2 kg    SpO2 97%    BMI 30.02 kg/m      PHYSICAL EXAM    GENERAL:NAD, no fevers, chills, no weakness no fatigue HEAD: Normocephalic, atraumatic.  EYES: Pupils equal, round, reactive to light. Extraocular muscles intact. No scleral icterus.  MOUTH: Moist mucosal membrane. Dentition intact. No abscess noted.  EAR, NOSE, THROAT: Clear without exudates. No external lesions.  NECK: Supple. No thyromegaly. No nodules. No JVD.  PULMONARY: bilateral rhonchi improved from prior CARDIOVASCULAR: S1 and S2. Regular rate and rhythm. No murmurs, rubs, or gallops. No edema. Pedal pulses 2+ bilaterally.  GASTROINTESTINAL: Soft, nontender, nondistended. No masses. Positive bowel sounds. No hepatosplenomegaly.  MUSCULOSKELETAL: No swelling, clubbing, or edema. Range of motion full in all extremities.  NEUROLOGIC: Cranial nerves II through XII are intact. No gross focal neurological deficits. Sensation intact. Reflexes intact.  SKIN: No ulceration, lesions, rashes, or cyanosis. Skin warm and dry. Turgor intact.  PSYCHIATRIC: Mood, affect within normal limits. The patient is awake, alert and oriented x 3. Insight, judgment intact.       IMAGING    DG Chest 2 View  Result Date: 06/29/2021 CLINICAL DATA:  Shortness of breath. Chest congestion, cough and body aches. EXAM: CHEST - 2 VIEW COMPARISON:  10/11/2020 FINDINGS: Heart size upper limits of normal. Aortic atherosclerosis as seen previously. Bronchial thickening with patchy infiltrates in both lower lobes consistent with bronchopneumonia.  No effusion. No lobar  consolidation or collapse. Chronic compression deformities of the thoracolumbar junction region as seen previously. IMPRESSION: Bilateral lower lobe bronchitis and patchy bronchopneumonia. Electronically Signed   By: Nelson Chimes M.D.   On: 06/29/2021 22:25   CT Angio Chest Pulmonary Embolism (PE) W or WO Contrast  Result Date: 07/01/2021 CLINICAL DATA:  Pulmonary embolism (PE) suspected, unknown D-dimer EXAM: CT ANGIOGRAPHY CHEST WITH CONTRAST TECHNIQUE: Multidetector CT imaging of the chest was performed using the standard protocol during bolus administration of intravenous contrast. Multiplanar CT image reconstructions and MIPs were obtained to evaluate the vascular anatomy. RADIATION DOSE REDUCTION: This exam was performed according to the departmental dose-optimization program which includes automated exposure control, adjustment of the mA and/or kV according to patient size and/or use of iterative reconstruction technique. CONTRAST:  22mL OMNIPAQUE IOHEXOL 350 MG/ML SOLN COMPARISON:  Radiographs 07/01/2021, 06/29/2021, 10/11/2020 FINDINGS: Cardiovascular: Respiratory motion artifact limits evaluation of the segmental and subsegmental vessels. There is no evidence of pulmonary embolism to the level of the lobar arteries. Mild cardiomegaly. No pericardial disease. Coronary artery calcifications. Mild atherosclerotic calcifications of the aortic arch and descending aorta. Mediastinum/Nodes: There are a few prominent mediastinal and hilar lymph nodes, likely reactive. Enlarged, multinodular goiter with mediastinal extension, increased in size in comparison due prior chest CT and March 2012. Lungs/Pleura: High there is multifocal consolidation and ground-glass opacities bilaterally, most confluent in the lower lobes and right middle lobe into a slightly lesser degree in the upper lobes. Small bilateral pleural effusions. No pneumothorax. Upper Abdomen: Airways are patent. Musculoskeletal: Chronic anterior  compression fracture of T12. No acute osseous abnormality. Diffuse idiopathic skeletal hyperostosis. Review of the MIP images confirms the above findings. IMPRESSION: Multifocal pneumonia.  Small bilateral pleural effusions. Respiratory motion artifact limits evaluation of the segmental and subsegmental pulmonary arteries. Within this limitation there is no evidence of pulmonary embolism to the level of the lobar arteries. Multinodular goiter with intrathoracic extension, increased in size in comparison to prior chest CT in March 2012. Aortic Atherosclerosis (ICD10-I70.0). Electronically Signed   By: Maurine Simmering M.D.   On: 07/01/2021 10:04   DG Chest Port 1 View  Result Date: 07/05/2021 CLINICAL DATA:  Body aches with chills, cough and congestion after recent cruise. Fever 102. EXAM: PORTABLE CHEST 1 VIEW COMPARISON:  07/01/2021 FINDINGS: Lungs are adequately inflated with persistent bilateral patchy airspace opacification most prominent over the mid lungs. No effusion. Cardiomediastinal silhouette and remainder of the exam is unchanged. IMPRESSION: Persistent bilateral patchy airspace process likely multifocal pneumonia. Persistent bilateral airspace process likely multifocal pneumonia. Electronically Signed   By: Marin Olp M.D.   On: 07/05/2021 13:51   DG Chest Port 1 View  Result Date: 07/01/2021 CLINICAL DATA:  Dyspnea. EXAM: PORTABLE CHEST 1 VIEW COMPARISON:  06/29/2021 FINDINGS: Heart size is normal. Interval development of bilateral LOWER lobe infiltrates a RIGHT slightly more confluent than the LEFT. IMPRESSION: New bilateral LOWER lobe infiltrates. Electronically Signed   By: Nolon Nations M.D.   On: 07/01/2021 08:55   ECHOCARDIOGRAM COMPLETE  Result Date: 07/02/2021    ECHOCARDIOGRAM REPORT   Patient Name:   Ricardo Nash Date of Exam: 07/01/2021 Medical Rec #:  [id number]          Height:       69.0 in Accession #:    [id number]         Weight:       203.0 lb Date of Birth:   [birth year]  BSA:          2.079 m Patient Age:    72 years           BP:           155/60 mmHg Patient Gender: M                  HR:           82 bpm. Exam Location:  [facility] Procedure: 2D Echo, Color Doppler and Cardiac Doppler Indications:     R06.03 Acute respiratory distress  History:         Patient has no prior history of Echocardiogram examinations.                  CAD, Stroke; Risk Factors:Hypertension, Sleep Apnea, HCL and                  Diabetes.  Sonographer:     Charmayne Sheer Referring Phys:  [id number] Richarda Osmond Diagnosing Phys: Yolonda Kida MD IMPRESSIONS  1. Left ventricular ejection fraction, by estimation, is 40 to 45%. The left ventricle has mildly decreased function. The left ventricle demonstrates global hypokinesis. The left ventricular internal cavity size was mildly to moderately dilated. Left ventricular diastolic parameters were normal.  2. Right ventricular systolic function is low normal. The right ventricular size is mildly enlarged. Mildly increased right ventricular wall thickness.  3. The mitral valve is grossly normal. Trivial mitral valve regurgitation.  4. The aortic valve is grossly normal. Aortic valve regurgitation is not visualized. FINDINGS  Left Ventricle: Left ventricular ejection fraction, by estimation, is 40 to 45%. The left ventricle has mildly decreased function. The left ventricle demonstrates global hypokinesis. The left ventricular internal cavity size was mildly to moderately dilated. There is no left ventricular hypertrophy. Abnormal (paradoxical) septal motion, consistent with left bundle branch block. Left ventricular diastolic parameters were normal. Right Ventricle: The right ventricular size is mildly enlarged. Mildly increased right ventricular wall thickness. Right ventricular systolic function is low normal. Left Atrium: Left atrial size was normal in size. Right Atrium: Right atrial size was normal in size. Pericardium: There is no evidence  of pericardial effusion. Mitral Valve: The mitral valve is grossly normal. Trivial mitral valve regurgitation. MV peak gradient, 4.5 mmHg. The mean mitral valve gradient is 3.0 mmHg. Tricuspid Valve: The tricuspid valve is grossly normal. Tricuspid valve regurgitation is trivial. Aortic Valve: The aortic valve is grossly normal. Aortic valve regurgitation is not visualized. Aortic valve mean gradient measures 5.0 mmHg. Aortic valve peak gradient measures 9.4 mmHg. Aortic valve area, by VTI measures 1.95 cm. Pulmonic Valve: The pulmonic valve was normal in structure. Pulmonic valve regurgitation is not visualized. Aorta: The ascending aorta was not well visualized. IAS/Shunts: No atrial level shunt detected by color flow Doppler.  LEFT VENTRICLE PLAX 2D LVIDd:         5.17 cm   Diastology LVIDs:         3.99 cm   LV e' medial:    6.20 cm/s LV PW:         0.98 cm   LV E/e' medial:  14.6 LV IVS:        0.87 cm   LV e' lateral:   6.96 cm/s LVOT diam:     2.00 cm   LV E/e' lateral: 13.0 LV SV:         54 LV SV Index:   26 LVOT Area:  3.14 cm  RIGHT VENTRICLE RV Basal diam:  3.54 cm LEFT ATRIUM             Index        RIGHT ATRIUM           Index LA diam:        4.50 cm 2.16 cm/m   RA Area:     11.20 cm LA Vol (A2C):   28.3 ml 13.61 ml/m  RA Volume:   19.20 ml  9.23 ml/m LA Vol (A4C):   67.0 ml 32.23 ml/m LA Biplane Vol: 46.6 ml 22.41 ml/m  AORTIC VALVE                    PULMONIC VALVE AV Area (Vmax):    1.90 cm     PV Vmax:          1.27 m/s AV Area (Vmean):   2.04 cm     PV Vmean:         82.700 cm/s AV Area (VTI):     1.95 cm     PV VTI:           0.208 m AV Vmax:           153.00 cm/s  PV Peak grad:     6.5 mmHg AV Vmean:          97.400 cm/s  PV Mean grad:     3.0 mmHg AV VTI:            0.275 m      PR End Diast Vel: 3.05 msec AV Peak Grad:      9.4 mmHg AV Mean Grad:      5.0 mmHg LVOT Vmax:         92.70 cm/s LVOT Vmean:        63.200 cm/s LVOT VTI:          0.171 m LVOT/AV VTI ratio: 0.62  AORTA  Ao Root diam: 3.10 cm MITRAL VALVE MV Area (PHT): 4.86 cm    SHUNTS MV Area VTI:   1.77 cm    Systemic VTI:  0.17 m MV Peak grad:  4.5 mmHg    Systemic Diam: 2.00 cm MV Mean grad:  3.0 mmHg MV Vmax:       1.06 m/s MV Vmean:      80.7 cm/s MV Decel Time: 156 msec MV E velocity: 90.80 cm/s MV A velocity: 90.80 cm/s MV E/A ratio:  1.00 Dwayne Prince Rome MD Electronically signed by Yolonda Kida MD Signature Date/Time: 07/02/2021/3:46:32 PM    Final          ASSESSMENT/PLAN   Acute hypoxemic respiratory failure.   Patienthas multifocal pneumonia  - RVP is negative  - he shares recurrent pneumonia   - denies mold or fungal exposure or history   - Rocephin and zithromax    -Procalcitonin is mildly elevated continue antibiotics  -MRSA PCR is negative -prednisone at 20mg  , CRP iimproved  Acute on chronic systolic CHF S/p TTE with global hypokinesis - lasix 40 daily for 48h more    Acute kidney injury - KDIGO 2- resolved -due to acutely ill status with pneumonia and possible ATN  -Resolved after treatment  Thank you for allowing me to participate in the care of this patient.   Patient/Family are satisfied with care plan and all questions have been answered.  This document was prepared using Dragon voice recognition software and may include unintentional dictation  errors.     Ottie Glazier, M.D.  Division of [place]

## 2021-07-06 NOTE — Assessment & Plan Note (Signed)
Blood pressure within goal. -Continue carvedilol, amlodipine. -Home dose of lisinopril was switched with Maxide for concern of dry cough by pulmonology.

## 2021-07-06 NOTE — Progress Notes (Signed)
Progress Note   Patient: Ricardo Nash NIO:[id number] DOB: 1945/09/03 DOA: 06/29/2021     6 DOS: the patient was seen and examined on 07/06/2021   Brief hospital course: Taken from prior notes.  76 year old Caucasian male with a past medical history significant for paroxysmal atrial fibrillation not on chronic anticoagulation, non-insulin-dependent diabetes mellitus type 2, essential hypertension, coronary artery disease on DAPT: Aspirin and Plavix, chronic LBBB.   This patient presented to the emergency department with a 2-day history of body aches, chills, cough and congestion after returning from a cruise to the [place].  He also had a fever of 102 F at home.  Also has shortness of breath.  No chest pain or lower extremity pain/swelling.  Not on oxygen at home.  On arrival he was afebrile, hypoxic on room temperature at mid 80s, initially requiring 4 L of oxygen.  COVID-19 and influenza PCR negative.  Chest x-ray with bilateral patchy infiltrate consistent with bilateral pneumonia.  Respiratory viral panel negative.  CTA was obtained due to worsening hypoxia which was negative for PE but did show multifocal pneumonia.  Currently on cefepime and Zithromax.  Sputum cultures pending. Saturating well on heated high flow at 45 L. 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  Pulmonology was consulted due to worsening hypoxia-they added low-dose steroid.  2/22: Breathing status remained mostly unchanged, remained on heated high flow at 40 L with FiO2 of 55% this morning.  Having bouts of coughing overnight which interfere with his sleep. Pulmonology added Lasix 40 mg twice daily today.  Procalcitonin improving, Fungitell pending.  2/23: Patient appears more comfortable.  Remained on heated high flow at 40 L.  Diuresing well with Lasix with some improvement in creatinine.  Procalcitonin  improving with some worsening of CRP.  2/24: Patient remained on 40 L, FiO2 was decreased to 35%.  Clinically seems improving.  Repeat chest x-ray with persistent bilateral infiltrate.  Pulmonology is on board-appreciate their help. Fungitell still pending.  2/25: Patient continued to improve, now on 12 L of oxygen.  Pulmonology switched lisinopril with Maxide for some added diuresis and for some concern of dry cough.  CRP trending down.    Assessment and Plan: * Acute respiratory failure with hypoxia (HCC) Improving, oxygen requirement decreased to 12 L.  No baseline oxygen requirement.  CTA negative for PE.  Clinically seems stable.  Repeat chest x-ray with persistent bilateral infiltrate. Pulmonology was consulted-appreciate their help. -Supplemental O2 to keep sats over 90%-wean as tolerated -Continue daily Lasix -Continue low-dose steroid -Incentive spirometry and flutter valve  Pneumonia- (present on admission) Mildly elevated procalcitonin.  Sputum cultures with normal respiratory flora.  MRSA swab negative.  Respiratory viral panel negative.  Antibiotics were broadened to cefepime and Zithromax due to worsening hypoxia.  Procalcitonin trending down now. -Continue cefepime -to complete a 7-day course. -Completed 5-day course of Zithromax -Supplemental oxygen to keep the saturation above 90% -Continue with supportive care -Continue Tussionex -specially at night to get a better sleep -Chest PT  Paroxysmal atrial fibrillation ([place])- (present on admission) -Continue carvedilol and aspirin -Not on any anticoagulation at home  Diabetes mellitus (HCC) CBG within goal. -Sliding scale insulin coverage -Continue Semglee 10 units at bedtime  Hypertension- (present on admission)  Blood pressure within goal. -Continue carvedilol, amlodipine. -Home dose of lisinopril was switched with Maxide for concern of dry cough by pulmonology.  CAD (coronary artery disease), native coronary  artery- (present on admission)  No chest pain. -Continue aspirin, atorvastatin and carvedilol and lisinopril  GERD (gastroesophageal reflux disease)- (present on admission) - Continue Protonix  Obstructive sleep apnea- (present on admission) Continue with CPAP at night which he uses at home once hypoxia improves   Subjective: Patient seems improving.  Continues to have some dry cough but much less than before.  Currently on 12 L of oxygen.  No other complaints.  Physical Exam: Vitals:   07/06/21 0806 07/06/21 1124 07/06/21 1323 07/06/21 1457  BP:  (!) 149/55  (!) 142/51  Pulse:  78  77  Resp:  19  19  Temp:  98.3 F (36.8 C)  98 F (36.7 C)  TempSrc:  Oral    SpO2: 96% 98% 96% 97%  Weight:      Height:       General.  Well-developed elderly man, in no acute distress. Pulmonary.  Lungs clear bilaterally, normal respiratory effort. CV.  Regular rate and rhythm, no JVD, rub or murmur. Abdomen.  Soft, nontender, nondistended, BS positive. CNS.  Alert and oriented .  No focal neurologic deficit. Extremities.  No edema, no cyanosis, pulses intact and symmetrical. Psychiatry.  Judgment and insight appears normal.  Data Reviewed: Reviewed prior notes and labs.  Family Communication:   Disposition: Status is: Inpatient Remains inpatient appropriate because: Severity of illness  Planned Discharge Destination: Home with Home Health  DVT prophylaxis.  Lovenox  Time spent: 43 minutes  This record has been created using Systems analyst. Errors have been sought and corrected,but may not always be located. Such creation errors do not reflect on the standard of care.  Author: Lorella Nimrod, MD 07/06/2021 3:41 PM  For on call review [email or web address].

## 2021-07-06 NOTE — Assessment & Plan Note (Signed)
Improving, oxygen requirement decreased to 12 L.  No baseline oxygen requirement.  CTA negative for PE.  Clinically seems stable.  Repeat chest x-ray with persistent bilateral infiltrate. Pulmonology was consulted-appreciate their help. -Supplemental O2 to keep sats over 90%-wean as tolerated -Continue daily Lasix -Continue low-dose steroid -Incentive spirometry and flutter valve

## 2021-07-06 NOTE — Assessment & Plan Note (Signed)
Mildly elevated procalcitonin.  Sputum cultures with normal respiratory flora.  MRSA swab negative.  Respiratory viral panel negative.  Antibiotics were broadened to cefepime and Zithromax due to worsening hypoxia.  Procalcitonin trending down now. -Continue cefepime -to complete a 7-day course. -Completed 5-day course of Zithromax -Supplemental oxygen to keep the saturation above 90% -Continue with supportive care -Continue Tussionex -specially at night to get a better sleep -Chest PT

## 2021-07-07 ENCOUNTER — Inpatient Hospital Stay: Payer: Medicare PPO

## 2021-07-07 LAB — BASIC METABOLIC PANEL
Anion gap: 9 (ref 5–15)
BUN: 27 mg/dL — ABNORMAL HIGH (ref 8–23)
CO2: 30 mmol/L (ref 22–32)
Calcium: 8.7 mg/dL — ABNORMAL LOW (ref 8.9–10.3)
Chloride: 100 mmol/L (ref 98–111)
Creatinine, Ser: 0.91 mg/dL (ref 0.61–1.24)
GFR, Estimated: >60 mL/min (ref >60)
Glucose, Bld: 115 mg/dL — ABNORMAL HIGH (ref 70–99)
Potassium: 3.6 mmol/L (ref 3.5–5.1)
Sodium: 139 mmol/L (ref 135–145)

## 2021-07-07 LAB — GLUCOSE, CAPILLARY
Glucose-Capillary: 119 mg/dL — ABNORMAL HIGH (ref 70–99)
Glucose-Capillary: 209 mg/dL — ABNORMAL HIGH (ref 70–99)

## 2021-07-07 MED ORDER — BENZONATATE 100 MG PO CAPS
100.0000 mg | ORAL_CAPSULE | Freq: Three times a day (TID) | ORAL | Status: DC | PRN
  Administered 2021-07-07: 100 mg via ORAL
  Filled 2021-07-07: qty 1

## 2021-07-07 MED ORDER — DOXYCYCLINE HYCLATE 100 MG PO TABS
100.0000 mg | ORAL_TABLET | Freq: Two times a day (BID) | ORAL | Status: DC
  Administered 2021-07-07 – 2021-07-08 (×2): 100 mg via ORAL
  Filled 2021-07-07 (×2): qty 1

## 2021-07-07 MED ORDER — FUROSEMIDE 10 MG/ML IJ SOLN
20.0000 mg | Freq: Every day | INTRAMUSCULAR | Status: DC
Start: 2021-07-08 — End: 2021-07-08
  Administered 2021-07-08: 20 mg via INTRAVENOUS
  Filled 2021-07-07: qty 2

## 2021-07-07 NOTE — TOC Initial Note (Signed)
Transition of Care [facility]) - Initial/Assessment Note    Patient Details  Name: Ricardo Nash MRN: [id number] Date of Birth: 03-May-1946  Transition of [facility] CM/SW Contact:    Magnus Ivan, LCSW Phone Number: 07/07/2021, 10:14 AM  Clinical Narrative:                Patient lives with his wife who will be providing transportation.  PCP is Dr. Nicki Reaper. Pharmacy is [facility].  Patient had [facility] in the past, unsure of agency. Has also used [facility] several times. Home address is [address] [place], [place]. Patient has a RW, raised toilet, walk in shower with seat, and grab bars in the bathroom. Discussed PT/OT recs for [facility] and 3 in 1. Patient and spouse agreed to let staff know if they want a 3 in 1 prior to DC. Agreeable to [facility], no agency preference, referral made to [facility] with Alvis Lemmings for PT, OT, RN. TOC will monitor and set up home oxygen if needed.    Expected Discharge Plan: [place] Barriers to Discharge: Continued Medical Work up   Patient Goals and CMS Choice Patient states their goals for this hospitalization and ongoing recovery are:: home health CMS Medicare.gov Compare Post Acute Care list provided to:: Patient Choice offered to / list presented to : Spouse  Expected Discharge Plan and Services Expected Discharge Plan: [place]       Living arrangements for the past 2 months: Single Family Home                           HH Arranged: PT, OT, RN [facility] Agency: [facility] Date [facility] Agency Contacted: 07/07/21   Representative spoke with at [place]: Tommi Rumps  Prior Living Arrangements/Services Living arrangements for the past 2 months: [place] Lives with:: Spouse Patient language and need for interpreter reviewed:: Yes Do you feel safe going back to the place where you live?: Yes      Need for Family Participation in Patient Care: Yes (Comment) Care giver support system in place?: Yes  (comment) Current home services: DME Criminal Activity/Legal Involvement Pertinent to Current Situation/Hospitalization: No - Comment as needed  Activities of Daily Living Home Assistive Devices/Equipment: CPAP ADL Screening (condition at time of admission) Patient's cognitive ability adequate to safely complete daily activities?: Yes Is the patient deaf or have difficulty hearing?: Yes Does the patient have difficulty seeing, even when wearing glasses/contacts?: No Does the patient have difficulty concentrating, remembering, or making decisions?: No Patient able to express need for assistance with ADLs?: Yes Does the patient have difficulty dressing or bathing?: No Independently performs ADLs?: Yes (appropriate for developmental age) Does the patient have difficulty walking or climbing stairs?: No Weakness of Legs: None Weakness of Arms/Hands: None  Permission Sought/Granted Permission sought to share information with : Chartered certified accountant granted to share information with : Yes, Verbal Permission Granted     Permission granted to share info w AGENCY: [place] and DME agencies        Emotional Assessment       Orientation: : Oriented to Self, Oriented to Place, Oriented to  Time, Oriented to Situation Alcohol / Substance Use: Not Applicable Psych Involvement: No (comment)  Admission diagnosis:  Pneumonia [J18.9] Acute respiratory failure with hypoxia ([place]) [J96.01] Community acquired bilateral lower lobe pneumonia [J18.9] Patient Active Problem List   Diagnosis Date  Noted   Pneumonia 06/30/2021   Acute respiratory failure with hypoxia [facility])    History of lumbar laminectomy 10/15/2020   Aortic atherosclerosis ([place]) 10/14/2020   Right hip pain 10/23/2019   Left knee pain 07/29/2019   Anxiety 10/10/2018   Left hand pain 08/09/2018   Left carotid bruit 04/11/2018   Diarrhea 11/17/2017   Near syncope 11/17/2017   Change in bowel habits 06/25/2017    Abdominal pain 06/11/2017   Long-term use of high-risk medication 04/08/2017   Gout 02/16/2017   Frequent sinus infections 09/13/2016   Drooping eyelid 09/13/2016   Acute recurrent maxillary sinusitis 07/24/2016   Wheezing 05/29/2016   Stress 11/24/2015   Fatigue 09/09/2015   Lesion of soft tissue of lower leg and ankle 04/07/2015   Anemia, iron deficiency 08/28/2014   Health care maintenance 06/25/2014   BMI 33.0-33.9,adult 04/17/2014   Difficulty sleeping 04/17/2014   Acute cystitis without hematuria 03/27/2014   Pyuria 03/24/2014   Incomplete emptying of bladder 01/18/2014   Increased frequency of urination 01/18/2014   Back pain 11/21/2013   Lumbar radiculitis 10/11/2013   Paroxysmal atrial fibrillation ([place]) 09/28/2013   Benign essential hypertension 09/28/2013   CAD (coronary artery disease), native coronary artery 09/28/2013   Nontoxic uninodular goiter 09/28/2013   Obstructive sleep apnea 07/19/2012   Benign localized hyperplasia of prostate with urinary obstruction 05/24/2012   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

## 2021-07-07 NOTE — Progress Notes (Signed)
Mobility Specialist - Progress Note   07/07/21 1531  Mobility  Activity Refused mobility   Pt supine upon arrival using 4L. Refused session d/t recent activity and being fatigued. Will attempt at another date and time.  Merrily Brittle Mobility Specialist 07/07/21, 3:32 PM

## 2021-07-07 NOTE — Assessment & Plan Note (Signed)
Improving, oxygen requirement decreased to 4 L.  No baseline oxygen requirement.  CTA negative for PE.  Clinically seems stable.  Repeat chest x-ray with persistent bilateral infiltrate. Pulmonology was consulted-appreciate their help. -Supplemental O2 to keep sats over 90%-wean as tolerated -Continue daily Lasix-dose was decreased to 20 mg p.o. -Continue low-dose steroid-will need a slow taper, decreasing 10 mg 3 days. -Incentive spirometry and flutter valve

## 2021-07-07 NOTE — Progress Notes (Signed)
PULMONOLOGY         Date: 07/07/2021,   MRN# [id number] Ricardo Nash [facility] 03-13-1946     AdmissionWeight: 92.1 kg                 CurrentWeight: 92.2 kg   Referring physician: Dr Reesa Chew   CHIEF COMPLAINT:   Acute hypoxemic respiratory failure   HISTORY OF PRESENT ILLNESS   This is a 76 yo M with hx of PAF, dm htn came in with day history of body aches, chills, cough and congestion starting on his return from a cruise to the [place]. He also had a fever of 102. He has associated shortness of breath.  States his symptoms are similar to when he had pneumonia sometime ago.  arrival afebrile with pulse 87, BP 142/80, O2 sat 87% on room air improving to 94% on 4 L.  COVID is negative.   He had CT chest which I reviewed independently and is with findings of multifocal bilateral pneumonia.   PCCM consult for severe acute hypoxemic respiratory failure  07/03/21-  patient is on 60L/min HFNC with 45L/min, CRP is trending down with steroids. PCT is trending down with antibiotics.  He is no better today clinically.  He is reciving narcotics and xanax which may hinder his respiratory status and contribute to shallow breathing.  07/05/21- patient is improved, he is negative 5 liters and CRP is coming down nicely.   Patient does have dry cough he likely has this from pna but id like to remove lisinopril and exchange for maxizide for additional diuresis and antihypertensive therapy with hopefully less cough.  07/06/21 - patient is improved and is oxygenating well on 2L/min.  From pulmonary patient is cleared to dc home in am. Dcd cefepime today starting doxycycline bid  x 6 more days.  Prednisone 20mg  to be continued for 6 more days coupled with antibiotics then 10mg  for 3 days to taper then 5mg  for 3 days. Reduced lasix to 20mg  can dc on lasix 20mg  daily until he is seen in office within 7 days of dc from hospital   PAST MEDICAL HISTORY   Past Medical History:  Diagnosis Date   C.  difficile colitis    Coronary artery disease    Depression    Diabetes mellitus ([place])    Diverticulitis    Diverticulosis    Dysrhythmia    PAF   Environmental allergies    GERD (gastroesophageal reflux disease)    Goiter    intrathoracic, s/p benign biopsy (Dr Harlow Asa)   Hypercholesterolemia    Hypertension    Osteoarthritis    cervical spine, lumbar spine   Paroxysmal atrial fibrillation (H[facility])    Pneumonia    PONV (postoperative nausea and vomiting)    Sleep apnea    CPAP @ NIGHT   Stroke [facility])    TIA (transient ischemic attack)      SURGICAL HISTORY   Past Surgical History:  Procedure Laterality Date   BACK SURGERY  8/08   s/p fusion of L4-L5   capsule endoscopy     CATARACT EXTRACTION  2011   Dr. Herbert Deaner   CERVICAL DISC SURGERY  2002   COLONOSCOPY WITH PROPOFOL N/A 07/20/2017   Procedure: COLONOSCOPY WITH PROPOFOL;  Surgeon: Manya Silvas, MD;  Location: [facility] ENDOSCOPY;  Service: Endoscopy;  Laterality: N/A;   EYE SURGERY     cataract bilateral 10/1999   eyelid reduction     Dr. Carlis Abbott   INGUINAL HERNIA  REPAIR  1991   Dr, Tamala Julian   LUMBAR LAMINECTOMY/DECOMPRESSION MICRODISCECTOMY N/A 10/15/2020   Procedure: Laminectomy - Lumbar Two-Lumbar Three - Lumbar Three-Lumbar Four with sublaminar decompression;  Surgeon: Eustace Moore, MD;  Location: [facility];  Service: Neurosurgery;  Laterality: N/A;  Laminectomy - Lumbar Two-Lumbar Three - Lumbar Three-Lumbar Four with sublaminar decompression   NOSE SURGERY     turbinate reduction   SEPTOPLASTY  1975   SHOULDER SURGERY  2000   rotator cuff     FAMILY HISTORY   Family History  Problem Relation Age of Onset   Congestive Heart Failure Father    Heart disease Father        myocardial infarction   Rheumatic fever Father        valvular disease   Heart disease Mother        s/p CABG (age 73)   Kidney disease Sister    Colon cancer Neg Hx    Prostate cancer Neg Hx      SOCIAL  HISTORY   Social History   Tobacco Use   Smoking status: Former   Smokeless tobacco: Never  Vaping Use   Vaping Use: Never used  Substance Use Topics   Alcohol use: Yes    Alcohol/week: 0.0 standard drinks    Comment: occasional   Drug use: No     MEDICATIONS    Home Medication:    Current Medication:  Current Facility-Administered Medications:    acetaminophen (TYLENOL) tablet 650 mg, 650 mg, Oral, Q6H PRN, 650 mg at 07/06/21 1200 **OR** acetaminophen (TYLENOL) suppository 650 mg, 650 mg, Rectal, Q6H PRN, Athena Masse, MD   albuterol (PROVENTIL) (2.5 MG/3ML) 0.083% nebulizer solution 2.5 mg, 2.5 mg, Nebulization, Q2H PRN, Foust, Katy L, NP, 2.5 mg at 07/05/21 1845   ALPRAZolam (XANAX) tablet 0.5 mg, 0.5 mg, Oral, TID PRN, Renda Rolls, RPH, 0.5 mg at 07/07/21 0000   aspirin EC tablet 81 mg, 81 mg, Oral, QPM, Athena Masse, MD, 81 mg at 07/06/21 1636   atorvastatin (LIPITOR) tablet 20 mg, 20 mg, Oral, Daily, Judd Gaudier V, MD, 20 mg at 07/07/21 0910   carvedilol (COREG) tablet 25 mg, 25 mg, Oral, BID WC, Judd Gaudier V, MD, 25 mg at 07/07/21 0910   ceFEPIme (MAXIPIME) 2 g in sodium chloride 0.9 % 100 mL IVPB, 2 g, Intravenous, Q12H, Amin, Soundra Pilon, MD, Last Rate: 200 mL/hr at 07/07/21 0911, 2 g at 07/07/21 0911   chlorpheniramine-HYDROcodone 10-8 MG/5ML suspension 5 mL, 5 mL, Oral, Q12H PRN, Lorella Nimrod, MD, 5 mL at 07/07/21 1045   enoxaparin (LOVENOX) injection 40 mg, 40 mg, Subcutaneous, Q24H, Judd Gaudier V, MD, 40 mg at 07/07/21 0912   fluticasone (FLONASE) 50 MCG/ACT nasal spray 2 spray, 2 spray, Each Nare, Daily, Dorothe Pea, RPH, 2 spray at 07/07/21 0912   furosemide (LASIX) injection 40 mg, 40 mg, Intravenous, Daily, Ottie Glazier, MD, 40 mg at 07/07/21 0910   guaiFENesin (MUCINEX) 12 hr tablet 1,200 mg, 1,200 mg, Oral, BID, Anwar, Shayan S, DO, 1,200 mg at 07/07/21 0910   hydrALAZINE (APRESOLINE) injection 10 mg, 10 mg, Intravenous,  Q8H PRN, Lorella Nimrod, MD   insulin aspart (novoLOG) injection 0-15 Units, 0-15 Units, Subcutaneous, TID WC, Athena Masse, MD, 3 Units at 07/06/21 1636   insulin glargine-yfgn (SEMGLEE) injection 10 Units, 10 Units, Subcutaneous, QHS, Amin, Soundra Pilon, MD, 10 Units at 07/06/21 2132   ipratropium-albuterol (DUONEB) 0.5-2.5 (3) MG/3ML nebulizer solution 3 mL, 3 mL, Nebulization,  TID, Imagene Sheller S, DO, 3 mL at 07/07/21 1347   loratadine (CLARITIN) tablet 10 mg, 10 mg, Oral, Daily, Dorothe Pea, RPH, 10 mg at 07/07/21 9604   MEDLINE mouth rinse, 15 mL, Mouth Rinse, BID, Athena Masse, MD, 15 mL at 07/07/21 0912   menthol-cetylpyridinium (CEPACOL) lozenge 3 mg, 1 lozenge, Oral, PRN, Foust, Katy L, NP, 3 mg at 07/05/21 1827   metFORMIN (GLUCOPHAGE) tablet 1,000 mg, 1,000 mg, Oral, BID WC, Richarda Osmond, MD, 1,000 mg at 07/07/21 0910   ondansetron (ZOFRAN) tablet 4 mg, 4 mg, Oral, Q6H PRN **OR** ondansetron (ZOFRAN) injection 4 mg, 4 mg, Intravenous, Q6H PRN, Athena Masse, MD, 4 mg at 06/30/21 1112   pantoprazole (PROTONIX) EC tablet 40 mg, 40 mg, Oral, Daily, Richarda Osmond, MD, 40 mg at 07/07/21 0912   pioglitazone (ACTOS) tablet 15 mg, 15 mg, Oral, Daily, Richarda Osmond, MD, 15 mg at 07/07/21 5409   predniSONE (DELTASONE) tablet 20 mg, 20 mg, Oral, Q breakfast, Lorella Nimrod, MD, 20 mg at 07/07/21 8119   sertraline (ZOLOFT) tablet 100 mg, 100 mg, Oral, Daily, Richarda Osmond, MD, 100 mg at 07/07/21 0910   tamsulosin (FLOMAX) capsule 0.4 mg, 0.4 mg, Oral, Q breakfast, Richarda Osmond, MD, 0.4 mg at 07/07/21 1478   traZODone (DESYREL) tablet 75 mg, 75 mg, Oral, QHS, Richarda Osmond, MD, 75 mg at 07/06/21 2125   triamterene-hydrochlorothiazide (MAXZIDE-25) 37.5-25 MG per tablet 2 tablet, 2 tablet, Oral, Daily, Ottie Glazier, MD, 2 tablet at 07/07/21 0911    ALLERGIES   Penicillins     REVIEW OF SYSTEMS    Review of Systems:  Gen:   Denies  fever, sweats, chills weigh loss  HEENT: Denies blurred vision, double vision, ear pain, eye pain, hearing loss, nose bleeds, sore throat Cardiac:  No dizziness, chest pain or heaviness, chest tightness,edema Resp:   Denies cough or sputum porduction, shortness of breath,wheezing, hemoptysis,  Gi: Denies swallowing difficulty, stomach pain, nausea or vomiting, diarrhea, constipation, bowel incontinence Gu:  Denies bladder incontinence, burning urine Ext:   Denies Joint pain, stiffness or swelling Skin: Denies  skin rash, easy bruising or bleeding or hives Endoc:  Denies polyuria, polydipsia , polyphagia or weight change Psych:   Denies depression, insomnia or hallucinations   Other:  All other systems negative   VS: BP 132/66    Pulse 67    Temp 98 F (36.7 C)    Resp 19    Ht 5\' 9"  (1.753 m)    Wt 92.2 kg    SpO2 93%    BMI 30.02 kg/m      PHYSICAL EXAM    GENERAL:NAD, no fevers, chills, no weakness no fatigue HEAD: Normocephalic, atraumatic.  EYES: Pupils equal, round, reactive to light. Extraocular muscles intact. No scleral icterus.  MOUTH: Moist mucosal membrane. Dentition intact. No abscess noted.  EAR, NOSE, THROAT: Clear without exudates. No external lesions.  NECK: Supple. No thyromegaly. No nodules. No JVD.  PULMONARY: bilateral rhonchi improved from prior CARDIOVASCULAR: S1 and S2. Regular rate and rhythm. No murmurs, rubs, or gallops. No edema. Pedal pulses 2+ bilaterally.  GASTROINTESTINAL: Soft, nontender, nondistended. No masses. Positive bowel sounds. No hepatosplenomegaly.  MUSCULOSKELETAL: No swelling, clubbing, or edema. Range of motion full in all extremities.  NEUROLOGIC: Cranial nerves II through XII are intact. No gross focal neurological deficits. Sensation intact. Reflexes intact.  SKIN: No ulceration, lesions, rashes, or cyanosis. Skin warm and dry. Turgor intact.  PSYCHIATRIC: Mood, affect within normal limits. The patient is awake, alert and  oriented x 3. Insight, judgment intact.       IMAGING    DG Chest 2 View  Result Date: 06/29/2021 CLINICAL DATA:  Shortness of breath. Chest congestion, cough and body aches. EXAM: CHEST - 2 VIEW COMPARISON:  10/11/2020 FINDINGS: Heart size upper limits of normal. Aortic atherosclerosis as seen previously. Bronchial thickening with patchy infiltrates in both lower lobes consistent with bronchopneumonia. No effusion. No lobar consolidation or collapse. Chronic compression deformities of the thoracolumbar junction region as seen previously. IMPRESSION: Bilateral lower lobe bronchitis and patchy bronchopneumonia. Electronically Signed   By: Nelson Chimes M.D.   On: 06/29/2021 22:25   CT Angio Chest Pulmonary Embolism (PE) W or WO Contrast  Result Date: 07/01/2021 CLINICAL DATA:  Pulmonary embolism (PE) suspected, unknown D-dimer EXAM: CT ANGIOGRAPHY CHEST WITH CONTRAST TECHNIQUE: Multidetector CT imaging of the chest was performed using the standard protocol during bolus administration of intravenous contrast. Multiplanar CT image reconstructions and MIPs were obtained to evaluate the vascular anatomy. RADIATION DOSE REDUCTION: This exam was performed according to the departmental dose-optimization program which includes automated exposure control, adjustment of the mA and/or kV according to patient size and/or use of iterative reconstruction technique. CONTRAST:  75mL OMNIPAQUE IOHEXOL 350 MG/ML SOLN COMPARISON:  Radiographs 07/01/2021, 06/29/2021, 10/11/2020 FINDINGS: Cardiovascular: Respiratory motion artifact limits evaluation of the segmental and subsegmental vessels. There is no evidence of pulmonary embolism to the level of the lobar arteries. Mild cardiomegaly. No pericardial disease. Coronary artery calcifications. Mild atherosclerotic calcifications of the aortic arch and descending aorta. Mediastinum/Nodes: There are a few prominent mediastinal and hilar lymph nodes, likely reactive. Enlarged,  multinodular goiter with mediastinal extension, increased in size in comparison due prior chest CT and March 2012. Lungs/Pleura: High there is multifocal consolidation and ground-glass opacities bilaterally, most confluent in the lower lobes and right middle lobe into a slightly lesser degree in the upper lobes. Small bilateral pleural effusions. No pneumothorax. Upper Abdomen: Airways are patent. Musculoskeletal: Chronic anterior compression fracture of T12. No acute osseous abnormality. Diffuse idiopathic skeletal hyperostosis. Review of the MIP images confirms the above findings. IMPRESSION: Multifocal pneumonia.  Small bilateral pleural effusions. Respiratory motion artifact limits evaluation of the segmental and subsegmental pulmonary arteries. Within this limitation there is no evidence of pulmonary embolism to the level of the lobar arteries. Multinodular goiter with intrathoracic extension, increased in size in comparison to prior chest CT in March 2012. Aortic Atherosclerosis (ICD10-I70.0). Electronically Signed   By: Maurine Simmering M.D.   On: 07/01/2021 10:04   DG Chest Port 1 View  Result Date: 07/05/2021 CLINICAL DATA:  Body aches with chills, cough and congestion after recent cruise. Fever 102. EXAM: PORTABLE CHEST 1 VIEW COMPARISON:  07/01/2021 FINDINGS: Lungs are adequately inflated with persistent bilateral patchy airspace opacification most prominent over the mid lungs. No effusion. Cardiomediastinal silhouette and remainder of the exam is unchanged. IMPRESSION: Persistent bilateral patchy airspace process likely multifocal pneumonia. Persistent bilateral airspace process likely multifocal pneumonia. Electronically Signed   By: Marin Olp M.D.   On: 07/05/2021 13:51   DG Chest Port 1 View  Result Date: 07/01/2021 CLINICAL DATA:  Dyspnea. EXAM: PORTABLE CHEST 1 VIEW COMPARISON:  06/29/2021 FINDINGS: Heart size is normal. Interval development of bilateral LOWER lobe infiltrates a RIGHT  slightly more confluent than the LEFT. IMPRESSION: New bilateral LOWER lobe infiltrates. Electronically Signed   By: Nolon Nations M.D.   On: 07/01/2021 08:55  ECHOCARDIOGRAM COMPLETE  Result Date: 07/02/2021    ECHOCARDIOGRAM REPORT   Patient Name:   ELLEN MAYOL Date of Exam: 07/01/2021 Medical Rec #:  [id number]          Height:       69.0 in Accession #:    [id number]         Weight:       203.0 lb Date of Birth:  05-29-1945          BSA:          2.079 m Patient Age:    14 years           BP:           155/60 mmHg Patient Gender: M                  HR:           82 bpm. Exam Location:  [facility] Procedure: 2D Echo, Color Doppler and Cardiac Doppler Indications:     R06.03 Acute respiratory distress  History:         Patient has no prior history of Echocardiogram examinations.                  CAD, Stroke; Risk Factors:Hypertension, Sleep Apnea, HCL and                  Diabetes.  Sonographer:     Charmayne Sheer Referring Phys:  [id number] Richarda Osmond Diagnosing Phys: Yolonda Kida MD IMPRESSIONS  1. Left ventricular ejection fraction, by estimation, is 40 to 45%. The left ventricle has mildly decreased function. The left ventricle demonstrates global hypokinesis. The left ventricular internal cavity size was mildly to moderately dilated. Left ventricular diastolic parameters were normal.  2. Right ventricular systolic function is low normal. The right ventricular size is mildly enlarged. Mildly increased right ventricular wall thickness.  3. The mitral valve is grossly normal. Trivial mitral valve regurgitation.  4. The aortic valve is grossly normal. Aortic valve regurgitation is not visualized. FINDINGS  Left Ventricle: Left ventricular ejection fraction, by estimation, is 40 to 45%. The left ventricle has mildly decreased function. The left ventricle demonstrates global hypokinesis. The left ventricular internal cavity size was mildly to moderately dilated. There is no left ventricular  hypertrophy. Abnormal (paradoxical) septal motion, consistent with left bundle branch block. Left ventricular diastolic parameters were normal. Right Ventricle: The right ventricular size is mildly enlarged. Mildly increased right ventricular wall thickness. Right ventricular systolic function is low normal. Left Atrium: Left atrial size was normal in size. Right Atrium: Right atrial size was normal in size. Pericardium: There is no evidence of pericardial effusion. Mitral Valve: The mitral valve is grossly normal. Trivial mitral valve regurgitation. MV peak gradient, 4.5 mmHg. The mean mitral valve gradient is 3.0 mmHg. Tricuspid Valve: The tricuspid valve is grossly normal. Tricuspid valve regurgitation is trivial. Aortic Valve: The aortic valve is grossly normal. Aortic valve regurgitation is not visualized. Aortic valve mean gradient measures 5.0 mmHg. Aortic valve peak gradient measures 9.4 mmHg. Aortic valve area, by VTI measures 1.95 cm. Pulmonic Valve: The pulmonic valve was normal in structure. Pulmonic valve regurgitation is not visualized. Aorta: The ascending aorta was not well visualized. IAS/Shunts: No atrial level shunt detected by color flow Doppler.  LEFT VENTRICLE PLAX 2D LVIDd:         5.17 cm   Diastology LVIDs:         3.99 cm  LV e' medial:    6.20 cm/s LV PW:         0.98 cm   LV E/e' medial:  14.6 LV IVS:        0.87 cm   LV e' lateral:   6.96 cm/s LVOT diam:     2.00 cm   LV E/e' lateral: 13.0 LV SV:         54 LV SV Index:   26 LVOT Area:     3.14 cm  RIGHT VENTRICLE RV Basal diam:  3.54 cm LEFT ATRIUM             Index        RIGHT ATRIUM           Index LA diam:        4.50 cm 2.16 cm/m   RA Area:     11.20 cm LA Vol (A2C):   28.3 ml 13.61 ml/m  RA Volume:   19.20 ml  9.23 ml/m LA Vol (A4C):   67.0 ml 32.23 ml/m LA Biplane Vol: 46.6 ml 22.41 ml/m  AORTIC VALVE                    PULMONIC VALVE AV Area (Vmax):    1.90 cm     PV Vmax:          1.27 m/s AV Area (Vmean):   2.04 cm      PV Vmean:         82.700 cm/s AV Area (VTI):     1.95 cm     PV VTI:           0.208 m AV Vmax:           153.00 cm/s  PV Peak grad:     6.5 mmHg AV Vmean:          97.400 cm/s  PV Mean grad:     3.0 mmHg AV VTI:            0.275 m      PR End Diast Vel: 3.05 msec AV Peak Grad:      9.4 mmHg AV Mean Grad:      5.0 mmHg LVOT Vmax:         92.70 cm/s LVOT Vmean:        63.200 cm/s LVOT VTI:          0.171 m LVOT/AV VTI ratio: 0.62  AORTA Ao Root diam: 3.10 cm MITRAL VALVE MV Area (PHT): 4.86 cm    SHUNTS MV Area VTI:   1.77 cm    Systemic VTI:  0.17 m MV Peak grad:  4.5 mmHg    Systemic Diam: 2.00 cm MV Mean grad:  3.0 mmHg MV Vmax:       1.06 m/s MV Vmean:      80.7 cm/s MV Decel Time: 156 msec MV E velocity: 90.80 cm/s MV A velocity: 90.80 cm/s MV E/A ratio:  1.00 Dwayne Prince Rome MD Electronically signed by Yolonda Kida MD Signature Date/Time: 07/02/2021/3:46:32 PM    Final          ASSESSMENT/PLAN   Acute hypoxemic respiratory failure.   Patienthas multifocal pneumonia  - RVP is negative  - he shares recurrent pneumonia   - denies mold or fungal exposure or history   - Rcefepime to doxy bid 100mg    -Procalcitonin is mildly elevated continue antibiotics  -MRSA PCR is negative -prednisone at 20mg  , CRP iimproved  Acute on chronic systolic CHF S/p TTE with global hypokinesis - lasix 20mg  daily long term    Acute kidney injury - KDIGO 2- resolved -due to acutely ill status with pneumonia and possible ATN  -Resolved after treatment -continue lasix 20mg  daily  Thank you for allowing me to participate in the care of this patient.   Patient/Family are satisfied with care plan and all questions have been answered.  This document was prepared using Dragon voice recognition software and may include unintentional dictation errors.     Ottie Glazier, M.D.  Division of [place]

## 2021-07-07 NOTE — Progress Notes (Signed)
Xanax administered at 11:58pm

## 2021-07-07 NOTE — Progress Notes (Signed)
Progress Note   Patient: Ricardo Nash WUJ:[id number] DOB: Oct 01, 1945 DOA: 06/29/2021     7 DOS: the patient was seen and examined on 07/07/2021   Brief hospital course: Taken from prior notes.  76 year old Caucasian male with a past medical history significant for paroxysmal atrial fibrillation not on chronic anticoagulation, non-insulin-dependent diabetes mellitus type 2, essential hypertension, coronary artery disease on DAPT: Aspirin and Plavix, chronic LBBB.   This patient presented to the emergency department with a 2-day history of body aches, chills, cough and congestion after returning from a cruise to the [place].  He also had a fever of 102 F at home.  Also has shortness of breath.  No chest pain or lower extremity pain/swelling.  Not on oxygen at home.  On arrival he was afebrile, hypoxic on room temperature at mid 80s, initially requiring 4 L of oxygen.  COVID-19 and influenza PCR negative.  Chest x-ray with bilateral patchy infiltrate consistent with bilateral pneumonia.  Respiratory viral panel negative.  CTA was obtained due to worsening hypoxia which was negative for PE but did show multifocal pneumonia.  Currently on cefepime and Zithromax.  Sputum cultures pending. Saturating well on heated high flow at 45 L. 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  Pulmonology was consulted due to worsening hypoxia-they added low-dose steroid.  2/22: Breathing status remained mostly unchanged, remained on heated high flow at 40 L with FiO2 of 55% this morning.  Having bouts of coughing overnight which interfere with his sleep. Pulmonology added Lasix 40 mg twice daily today.  Procalcitonin improving, Fungitell pending.  2/23: Patient appears more comfortable.  Remained on heated high flow at 40 L.  Diuresing well with Lasix with some improvement in creatinine.  Procalcitonin  improving with some worsening of CRP.  2/24: Patient remained on 40 L, FiO2 was decreased to 35%.  Clinically seems improving.  Repeat chest x-ray with persistent bilateral infiltrate.  Pulmonology is on board-appreciate their help. Fungitell still pending.  2/25: Patient continued to improve, now on 12 L of oxygen.  Pulmonology switched lisinopril with Maxide for some added diuresis and for some concern of dry cough.  CRP trending down.  2/26: Significant improvement in oxygenation, now on 4 L of oxygen.  Pulmonology is recommending 6 more days of doxycycline, a slow taper of prednisone and continue with 20 mg of p.o. Lasix until he will see him as an outpatient in 1 week. Most likely will be going home tomorrow.     Assessment and Plan: * Acute respiratory failure with hypoxia (HCC) Improving, oxygen requirement decreased to 4 L.  No baseline oxygen requirement.  CTA negative for PE.  Clinically seems stable.  Repeat chest x-ray with persistent bilateral infiltrate. Pulmonology was consulted-appreciate their help. -Supplemental O2 to keep sats over 90%-wean as tolerated -Continue daily Lasix-dose was decreased to 20 mg p.o. -Continue low-dose steroid-will need a slow taper, decreasing 10 mg 3 days. -Incentive spirometry and flutter valve  Pneumonia- (present on admission) Mildly elevated procalcitonin.  Sputum cultures with normal respiratory flora.  MRSA swab negative.  Respiratory viral panel negative.  Antibiotics were broadened to cefepime and Zithromax due to worsening hypoxia.  Procalcitonin trending down now.  Completed a course of cefepime and Zithromax -Started on doxycycline for 6 more days by pulmonology -Supplemental oxygen to keep the saturation above 90% -Continue with supportive care -Continue Tussionex -specially at night to get a better sleep -  Chest PT  Paroxysmal atrial fibrillation ([place])- (present on admission) -Continue carvedilol and aspirin -Not on any  anticoagulation at home  Diabetes mellitus (HCC) CBG within goal. -Sliding scale insulin coverage -Continue Semglee 10 units at bedtime  Hypertension- (present on admission)  Blood pressure within goal. -Continue carvedilol, amlodipine. -Home dose of lisinopril was switched with Maxide for concern of dry cough by pulmonology.  CAD (coronary artery disease), native coronary artery- (present on admission)  No chest pain. -Continue aspirin, atorvastatin and carvedilol and lisinopril  GERD (gastroesophageal reflux disease)- (present on admission) - Continue Protonix  Obstructive sleep apnea- (present on admission) Continue with CPAP at night which he uses at home once hypoxia improves   Subjective: Patient was seen and examined today.  Continues to have some cough but overall improving.  Wife at bedside.  Wants to go home.  Physical Exam: Vitals:   07/07/21 1149 07/07/21 1347 07/07/21 1459 07/07/21 1500  BP: 138/62   132/66  Pulse: 79   67  Resp:    19  Temp: 98.1 F (36.7 C)   98 F (36.7 C)  TempSrc: Oral     SpO2: 98% 93% 93% 93%  Weight:      Height:       General.  Well-developed gentleman in no acute distress. Pulmonary.  Lungs clear bilaterally, normal respiratory effort. CV.  Regular rate and rhythm, no JVD, rub or murmur. Abdomen.  Soft, nontender, nondistended, BS positive. CNS.  Alert and oriented x3.  No focal neurologic deficit. Extremities.  No edema, no cyanosis, pulses intact and symmetrical. Psychiatry.  Judgment and insight appears normal.  Data Reviewed: Prior notes, labs and images reviewed.  Family Communication: Discussed with wife at bedside  Disposition: Status is: Inpatient Remains inpatient appropriate because: Severity of illness  Planned Discharge Destination: Home with Home Health  DVT prophylaxis.  Lovenox  Time spent: 43 minutes  This record has been created using Systems analyst. Errors have been sought and  corrected,but may not always be located. Such creation errors do not reflect on the standard of care.  Author: Lorella Nimrod, MD 07/07/2021 3:37 PM  For on call review [email or web address].

## 2021-07-07 NOTE — Assessment & Plan Note (Signed)
Mildly elevated procalcitonin.  Sputum cultures with normal respiratory flora.  MRSA swab negative.  Respiratory viral panel negative.  Antibiotics were broadened to cefepime and Zithromax due to worsening hypoxia.  Procalcitonin trending down now.  Completed a course of cefepime and Zithromax -Started on doxycycline for 6 more days by pulmonology -Supplemental oxygen to keep the saturation above 90% -Continue with supportive care -Continue Tussionex -specially at night to get a better sleep -Chest PT

## 2021-07-08 LAB — BASIC METABOLIC PANEL
Anion gap: 14 (ref 5–15)
BUN: 29 mg/dL — ABNORMAL HIGH (ref 8–23)
CO2: 29 mmol/L (ref 22–32)
Calcium: 8.9 mg/dL (ref 8.9–10.3)
Chloride: 97 mmol/L — ABNORMAL LOW (ref 98–111)
Creatinine, Ser: 0.96 mg/dL (ref 0.61–1.24)
GFR, Estimated: >60 mL/min (ref >60)
Glucose, Bld: 107 mg/dL — ABNORMAL HIGH (ref 70–99)
Potassium: 3.5 mmol/L (ref 3.5–5.1)
Sodium: 140 mmol/L (ref 135–145)

## 2021-07-08 LAB — GLUCOSE, CAPILLARY: Glucose-Capillary: 127 mg/dL — ABNORMAL HIGH (ref 70–99)

## 2021-07-08 MED ORDER — LISINOPRIL 20 MG PO TABS
40.0000 mg | ORAL_TABLET | Freq: Every day | ORAL | Status: DC
  Administered 2021-07-08: 40 mg via ORAL
  Filled 2021-07-08: qty 2

## 2021-07-08 MED ORDER — HYDROCOD POLI-CHLORPHE POLI ER 10-8 MG/5ML PO SUER: 5.0000 mL | Freq: Two times a day (BID) | ORAL | 0 refills | Status: DC | PRN

## 2021-07-08 MED ORDER — BENZONATATE 100 MG PO CAPS: 100.0000 mg | ORAL_CAPSULE | Freq: Three times a day (TID) | ORAL | 0 refills | Status: DC | PRN

## 2021-07-08 MED ORDER — DOXYCYCLINE HYCLATE 100 MG PO TABS: 100.0000 mg | ORAL_TABLET | Freq: Two times a day (BID) | ORAL | 0 refills | Status: AC

## 2021-07-08 MED ORDER — GUAIFENESIN ER 600 MG PO TB12: 1200.0000 mg | ORAL_TABLET | Freq: Two times a day (BID) | ORAL | 0 refills | Status: DC

## 2021-07-08 MED ORDER — PREDNISONE 10 MG PO TABS: 20.0000 mg | ORAL_TABLET | Freq: Every day | ORAL | 0 refills | Status: DC

## 2021-07-08 MED ORDER — TRIAMTERENE-HCTZ 37.5-25 MG PO TABS: 2.0000 | ORAL_TABLET | Freq: Every day | ORAL | 1 refills | Status: DC

## 2021-07-08 NOTE — Progress Notes (Signed)
Occupational Therapy Treatment Patient Details Name: Yasser Hepp MRN: [id number] DOB: 1946-02-25 Today's Date: 07/08/2021   History of present illness Pt is a 76 yo M came in with day history of body aches, chills, cough and congestion starting on his return from a cruise to the [place]. Workup showed PNA, acute respiratory failure. PMH of PAF, DM, HTN.   OT comments  Pt seen for OT treatment on this date. Upon arrival to room, pt awake and seated upright EOB with PT present. Pt and pt's wife reporting that pt just walked with RN and performed seated therapy exercises with PT, however agreeable to OT tx.  Pt educated in energy conservation strategies including pursed lip breathing, activity pacing, home/routines modifications, AE/DME, prioritizing of meaningful occupations, and falls prevention; handout provided and pt and family verbalized understanding. Pt performed functional mobility of short household distances with RW and toilet transfer, requiring MIN GUARD and cues for initiating energy conservation strategies during functional tasks. Pt left in bed, with SpO2 92% on RA and pt with all needs within reach. Pt is making good progress toward goals and continues to benefit from skilled OT services to maximize return to PLOF and minimize risk of future falls, injury, caregiver burden, and readmission. Will continue to follow POC. Discharge recommendation remains appropriate.     Recommendations for follow up therapy are one component of a multi-disciplinary discharge planning process, led by the attending physician.  Recommendations may be updated based on patient status, additional functional criteria and insurance authorization.    Follow Up Recommendations  Home health OT    Assistance Recommended at Discharge Intermittent Supervision/Assistance  Patient can return home with the following  A little help with walking and/or transfers;A little help with  bathing/dressing/bathroom;Assistance with cooking/housework;Assistance with feeding;Help with stairs or ramp for entrance;Assist for transportation   Equipment Recommendations  BSC/3in1;Other (comment) (2ww)       Precautions / Restrictions Precautions Precautions: Fall Restrictions Weight Bearing Restrictions: No       Mobility Bed Mobility Overal bed mobility: Needs Assistance Bed Mobility: Sit to Supine       Sit to supine: Supervision, HOB elevated        Transfers Overall transfer level: Needs assistance Equipment used: Rolling walker (2 wheels) Transfers: Sit to/from Stand Sit to Stand: Min guard                 Balance Overall balance assessment: Needs assistance Sitting-balance support: Feet supported, No upper extremity supported Sitting balance-Leahy Scale: Good     Standing balance support: Bilateral upper extremity supported, During functional activity Standing balance-Leahy Scale: Fair Standing balance comment: improved steadiness with RW                           ADL either performed or assessed with clinical judgement   ADL Overall ADL's : Needs assistance/impaired                         Toilet Transfer: Min guard;BSC/3in1;Rolling walker (2 wheels);Ambulation           Functional mobility during ADLs: Min guard;Rolling walker (2 wheels)        Cognition Arousal/Alertness: Awake/alert Behavior During Therapy: WFL for tasks assessed/performed Overall Cognitive Status: Within Functional Limits for tasks assessed  Exercises Other Exercises Other Exercises: Pt educated in energy conservation strategies including pursed lip breathing, activity pacing, home/routines modifications, AE/DME, prioritizing of meaningful occupations, and falls prevention; handout provided and pt and family verbalized understanding.            Pertinent Vitals/ Pain       Pain  Assessment Pain Assessment: No/denies pain         Frequency  Min 2X/week        Progress Toward Goals  OT Goals(current goals can now be found in the care plan section)  Progress towards OT goals: Progressing toward goals  Acute Rehab OT Goals Patient Stated Goal: to go home OT Goal Formulation: With patient Time For Goal Achievement: 07/19/21 Potential to Achieve Goals: [place] Discharge plan remains appropriate;Frequency remains appropriate       AM-PAC OT "6 Clicks" Daily Activity     Outcome Measure   Help from another person eating meals?: None Help from another person taking care of personal grooming?: None Help from another person toileting, which includes using toliet, bedpan, or urinal?: A Little Help from another person bathing (including washing, rinsing, drying)?: A Little Help from another person to put on and taking off regular upper body clothing?: None Help from another person to put on and taking off regular lower body clothing?: A Little 6 Click Score: 21    End of Session Equipment Utilized During Treatment: Oxygen;Rolling walker (2 wheels)  OT Visit Diagnosis: Unsteadiness on feet (R26.81);Muscle weakness (generalized) (M62.81)   Activity Tolerance Patient tolerated treatment well   Patient Left in bed;with call bell/phone within reach;with family/visitor present   Nurse Communication Mobility status        Time: 1100-1119 OT Time Calculation (min): 19 min  Charges: OT General Charges $OT Visit: 1 Visit OT Treatments $Self Care/Home Management : 8-22 mins  Fredirick Maudlin, OTR/L [place]

## 2021-07-08 NOTE — Discharge Summary (Signed)
Physician Discharge Summary   Patient: Ricardo Nash MRN: [id number] DOB: [birth year]  Admit date:     06/29/2021  Discharge date: 07/08/21  Discharge Physician: Lorella Nimrod   PCP: Einar Pheasant, MD   Recommendations at discharge:  Please obtain CBC and BMP in 1 week Follow-up with pulmonology within a week Follow-up with primary care provider  Discharge Diagnoses: Principal Problem:   Acute respiratory failure with hypoxia [facility]) Active Problems:   Pneumonia   Paroxysmal atrial fibrillation ([place])   Diabetes mellitus ([place])   Hypertension   CAD (coronary artery disease), native coronary artery   GERD (gastroesophageal reflux disease)   Obstructive sleep apnea   History of lumbar laminectomy  Resolved Problems:   * No resolved hospital problems. [facility] Course: Taken from prior notes.  76 year old Caucasian male with a past medical history significant for paroxysmal atrial fibrillation not on chronic anticoagulation, non-insulin-dependent diabetes mellitus type 2, essential hypertension, coronary artery disease on DAPT: Aspirin and Plavix, chronic LBBB.   This patient presented to the emergency department with a 2-day history of body aches, chills, cough and congestion after returning from a cruise to the [place].  He also had a fever of 102 F at home.  Also has shortness of breath.  No chest pain or lower extremity pain/swelling.  Not on oxygen at home.  On arrival he was afebrile, hypoxic on room temperature at mid 80s, initially requiring 4 L of oxygen.  COVID-19 and influenza PCR negative.  Chest x-ray with bilateral patchy infiltrate consistent with bilateral pneumonia.  Respiratory viral panel negative.  CTA was obtained due to worsening hypoxia which was negative for PE but did show multifocal pneumonia.  Currently on cefepime and Zithromax.  Sputum cultures pending. Saturating well on heated high flow at 45 L. 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  Pulmonology was consulted due to worsening hypoxia-they added low-dose steroid.  2/22: Breathing status remained mostly unchanged, remained on heated high flow at 40 L with FiO2 of 55% this morning.  Having bouts of coughing overnight which interfere with his sleep. Pulmonology added Lasix 40 mg twice daily today.  Procalcitonin improving, Fungitell pending.  2/23: Patient appears more comfortable.  Remained on heated high flow at 40 L.  Diuresing well with Lasix with some improvement in creatinine.  Procalcitonin improving with some worsening of CRP.  2/24: Patient remained on 40 L, FiO2 was decreased to 35%.  Clinically seems improving.  Repeat chest x-ray with persistent bilateral infiltrate.  Pulmonology is on board-appreciate their help. Fungitell still pending.  2/25: Patient continued to improve, now on 12 L of oxygen.  Pulmonology switched lisinopril with Maxide for some added diuresis and for some concern of dry cough.  CRP trending down.  2/26: Significant improvement in oxygenation, now on 4 L of oxygen.  Pulmonology is recommending 6 more days of doxycycline, a slow taper of prednisone and continue with 20 mg of p.o. Lasix until he will see him as an outpatient in 1 week. Most likely will be going home tomorrow.  Patient remained stable.  Able to wean off to room air.  We did ambulate him with pulse ox and he maintained his saturation above 90%.  No need for home oxygen at this time.  He was discharged home on 6 more days of doxycycline and a prednisone taper.  He will follow-up closely with pulmonology for further recommendations.  Patient will continue the rest of his home medications and follow-up with his providers.  Assessment and Plan: * Acute respiratory failure with hypoxia (HCC) Improving, oxygen requirement decreased to 4 L.  No baseline oxygen requirement.  CTA negative  for PE.  Clinically seems stable.  Repeat chest x-ray with persistent bilateral infiltrate. Pulmonology was consulted-appreciate their help. -Supplemental O2 to keep sats over 90%-wean as tolerated -Continue daily Lasix-dose was decreased to 20 mg p.o. -Continue low-dose steroid-will need a slow taper, decreasing 10 mg 3 days. -Incentive spirometry and flutter valve  Pneumonia- (present on admission) Mildly elevated procalcitonin.  Sputum cultures with normal respiratory flora.  MRSA swab negative.  Respiratory viral panel negative.  Antibiotics were broadened to cefepime and Zithromax due to worsening hypoxia.  Procalcitonin trending down now.  Completed a course of cefepime and Zithromax -Started on doxycycline for 6 more days by pulmonology -Supplemental oxygen to keep the saturation above 90% -Continue with supportive care -Continue Tussionex -specially at night to get a better sleep -Chest PT  Paroxysmal atrial fibrillation ([place])- (present on admission) -Continue carvedilol and aspirin -Not on any anticoagulation at home  Diabetes mellitus (HCC) CBG within goal. -Sliding scale insulin coverage -Continue Semglee 10 units at bedtime  Hypertension- (present on admission)  Blood pressure within goal. -Continue carvedilol, amlodipine. -Home dose of lisinopril was switched with Maxide for concern of dry cough by pulmonology.  CAD (coronary artery disease), native coronary artery- (present on admission)  No chest pain. -Continue aspirin, atorvastatin and carvedilol and lisinopril  GERD (gastroesophageal reflux disease)- (present on admission) - Continue Protonix  Obstructive sleep apnea- (present on admission) Continue with CPAP at night which he uses at home once hypoxia improves  Consultants: Pulmonology Procedures performed: None Disposition: Home health Diet recommendation:  Discharge Diet Orders (From admission, onward)     Start     Ordered   07/08/21 0000  Diet -  low sodium heart healthy        07/08/21 1116           Cardiac and Carb modified diet  DISCHARGE MEDICATION: Allergies as of 07/08/2021       Reactions   Penicillins Hives   Childhood allergy (age 52)        Medication List     STOP taking these medications    hydrochlorothiazide 50 MG tablet Commonly known as: HYDRODIURIL       TAKE these medications    Accu-Chek Guide test strip Generic drug: glucose blood USE AS DIRECTED TO TEST BLOOD GLUCOSE LEVELS TWICE DAILY   ALPRAZolam 0.25 MG tablet Commonly known as: XANAX TAKE 1 TABLET BY MOUTH ONCE DAILY AS NEEDED.   amLODipine 10 MG tablet Commonly known as: NORVASC TAKE 1 TABLET BY MOUTH ONCE DAILY   aspirin EC 81 MG tablet Take 81 mg by mouth every evening. Swallow whole.   atorvastatin 20 MG tablet Commonly known as: LIPITOR Take 1 tablet (20 mg total) by mouth daily.   Azelastine-Fluticasone 137-50 MCG/ACT Susp Commonly known as: Dymista Place 1 spray into the nose in the morning and at bedtime.   benzonatate 100 MG capsule Commonly known as: TESSALON Take 1 capsule (100 mg total) by mouth 3 (three) times daily as needed for cough.   blood glucose meter kit and supplies Dispense based on patient and insurance preference. Use up to four times daily as directed. (FOR ICD-10 E10.9, E11.9).   carvedilol 25 MG tablet Commonly known as: COREG Take 1 tablet (25 mg  total) by mouth 2 (two) times daily with a meal.   chlorpheniramine-HYDROcodone 10-8 MG/5ML Take 5 mLs by mouth every 12 (twelve) hours as needed for cough.   doxycycline 100 MG tablet Commonly known as: VIBRA-TABS Take 1 tablet (100 mg total) by mouth every 12 (twelve) hours for 6 days.   guaiFENesin 600 MG 12 hr tablet Commonly known as: MUCINEX Take 2 tablets (1,200 mg total) by mouth 2 (two) times daily.   levocetirizine 5 MG tablet Commonly known as: XYZAL Take 1 tablet (5 mg total) by mouth every evening.   lisinopril 40 MG  tablet Commonly known as: ZESTRIL TAKE 1 TABLET BY MOUTH ONCE DAILY   meloxicam 15 MG tablet Commonly known as: MOBIC Take 15 mg by mouth daily.   metFORMIN 1000 MG tablet Commonly known as: GLUCOPHAGE TAKE 1 TABLET BY MOUTH TWICE DAILY WITH MEALS   montelukast 10 MG tablet Commonly known as: SINGULAIR Take 1 tablet (10 mg total) by mouth at bedtime.   multivitamin with minerals Tabs tablet Take 1 tablet by mouth in the morning.   pantoprazole 40 MG tablet Commonly known as: PROTONIX TAKE 1 TABLET BY MOUTH ONCE DAILY   pioglitazone 15 MG tablet Commonly known as: ACTOS Take 1 tablet (15 mg total) by mouth daily.   predniSONE 10 MG tablet Commonly known as: DELTASONE Take 2 tablets (20 mg total) by mouth daily with breakfast. For 3 days, then take 1 tablet for 3 days and then stop it. Start taking on: July 09, 2021   sertraline 100 MG tablet Commonly known as: ZOLOFT Take 1 tablet (100 mg total) by mouth daily.   tamsulosin 0.4 MG Caps capsule Commonly known as: FLOMAX Take 1 capsule (0.4 mg total) by mouth daily with breakfast.   testosterone cypionate 200 MG/ML injection Commonly known as: DEPOTESTOSTERONE CYPIONATE Inject 1 mL (200 mg total) into the muscle every 14 (fourteen) days.   traZODone 50 MG tablet Commonly known as: DESYREL Take 1.5 tablets (75 mg total) by mouth at bedtime.   triamterene-hydrochlorothiazide 37.5-25 MG tablet Commonly known as: MAXZIDE-25 Take 2 tablets by mouth daily. Start taking on: July 09, 7856   Trulicity 8.50 YD/7.4JO Sopn Generic drug: Dulaglutide INJECT 0.75MG SUBQ ONCE A WEEK   vitamin C 100 MG tablet Take 100 mg by mouth in the morning.        Follow-up Information     Einar Pheasant, MD. Schedule an appointment as soon as possible for a visit in 1 week(s).   Specialty: Internal Medicine Contact information: [address] [place] [address] [phone]          Ottie Glazier, MD. Schedule an appointment as soon as possible for a visit in 3 day(s).   Specialty: Pulmonary Disease Why: Please make appointment to be seen towards the end of this week Contact information: [place] [address] [phone]                 Discharge Exam: Danley Danker Weights   07/01/21 2000 07/02/21 1956 07/03/21 0500  Weight: 92.1 kg 92.1 kg 92.2 kg   General.     In no acute distress. Pulmonary.  Lungs clear bilaterally, normal respiratory effort. CV.  Regular rate and rhythm, no JVD, rub or murmur. Abdomen.  Soft, nontender, nondistended, BS positive. CNS.  Alert and oriented x3.  No focal neurologic deficit. Extremities.  No edema, no cyanosis, pulses intact and symmetrical. Psychiatry.  Judgment and insight appears normal.  Condition at discharge: stable  The results of significant diagnostics from this hospitalization (including imaging, microbiology, ancillary and laboratory) are listed below for reference.   Imaging Studies: DG Chest 2 View  Result Date: 06/29/2021 CLINICAL DATA:  Shortness of breath. Chest congestion, cough and body aches. EXAM: CHEST - 2 VIEW COMPARISON:  10/11/2020 FINDINGS: Heart size upper limits of normal. Aortic atherosclerosis as seen previously. Bronchial thickening with patchy infiltrates in both lower lobes consistent with bronchopneumonia. No effusion. No lobar consolidation or collapse. Chronic compression deformities of the thoracolumbar junction region as seen previously. IMPRESSION: Bilateral lower lobe bronchitis and patchy bronchopneumonia. Electronically Signed   By: Nelson Chimes M.D.   On: 06/29/2021 22:25   CT Angio Chest Pulmonary Embolism (PE) W or WO Contrast  Result Date: 07/01/2021 CLINICAL DATA:  Pulmonary embolism (PE) suspected, unknown D-dimer EXAM: CT ANGIOGRAPHY CHEST WITH CONTRAST TECHNIQUE: Multidetector CT imaging of the chest was performed using the standard protocol during  bolus administration of intravenous contrast. Multiplanar CT image reconstructions and MIPs were obtained to evaluate the vascular anatomy. RADIATION DOSE REDUCTION: This exam was performed according to the departmental dose-optimization program which includes automated exposure control, adjustment of the mA and/or kV according to patient size and/or use of iterative reconstruction technique. CONTRAST:  39m OMNIPAQUE IOHEXOL 350 MG/ML SOLN COMPARISON:  Radiographs 07/01/2021, 06/29/2021, 10/11/2020 FINDINGS: Cardiovascular: Respiratory motion artifact limits evaluation of the segmental and subsegmental vessels. There is no evidence of pulmonary embolism to the level of the lobar arteries. Mild cardiomegaly. No pericardial disease. Coronary artery calcifications. Mild atherosclerotic calcifications of the aortic arch and descending aorta. Mediastinum/Nodes: There are a few prominent mediastinal and hilar lymph nodes, likely reactive. Enlarged, multinodular goiter with mediastinal extension, increased in size in comparison due prior chest CT and March 2012. Lungs/Pleura: High there is multifocal consolidation and ground-glass opacities bilaterally, most confluent in the lower lobes and right middle lobe into a slightly lesser degree in the upper lobes. Small bilateral pleural effusions. No pneumothorax. Upper Abdomen: Airways are patent. Musculoskeletal: Chronic anterior compression fracture of T12. No acute osseous abnormality. Diffuse idiopathic skeletal hyperostosis. Review of the MIP images confirms the above findings. IMPRESSION: Multifocal pneumonia.  Small bilateral pleural effusions. Respiratory motion artifact limits evaluation of the segmental and subsegmental pulmonary arteries. Within this limitation there is no evidence of pulmonary embolism to the level of the lobar arteries. Multinodular goiter with intrathoracic extension, increased in size in comparison to prior chest CT in March 2012. Aortic  Atherosclerosis (ICD10-I70.0). Electronically Signed   By: JMaurine SimmeringM.D.   On: 07/01/2021 10:04   DG Chest Port 1 View  Result Date: 07/07/2021 CLINICAL DATA:  Pneumonia EXAM: PORTABLE CHEST 1 VIEW COMPARISON:  Previous studies including the examination of 07/05/2021 FINDINGS: Transverse diameter of heart is increased. There are patchy infiltrates in both parahilar regions and both lower lung fields with no significant interval change. Costophrenic angles are clear. There is no pneumothorax. There is previous surgical fusion in the lower cervical spine. Old malunited fracture is seen in the left clavicle. IMPRESSION: Patchy infiltrates in the mid and lower lung fields on both sides have not changed significantly suggesting multifocal pneumonia. Electronically Signed   By: PElmer PickerM.D.   On: 07/07/2021 16:38   DG Chest Port 1 View  Result Date: 07/05/2021 CLINICAL DATA:  Body aches with chills, cough and congestion after recent cruise. Fever 102. EXAM: PORTABLE CHEST 1 VIEW COMPARISON:  07/01/2021 FINDINGS: Lungs are adequately inflated with persistent  bilateral patchy airspace opacification most prominent over the mid lungs. No effusion. Cardiomediastinal silhouette and remainder of the exam is unchanged. IMPRESSION: Persistent bilateral patchy airspace process likely multifocal pneumonia. Persistent bilateral airspace process likely multifocal pneumonia. Electronically Signed   By: Marin Olp M.D.   On: 07/05/2021 13:51   DG Chest Port 1 View  Result Date: 07/01/2021 CLINICAL DATA:  Dyspnea. EXAM: PORTABLE CHEST 1 VIEW COMPARISON:  06/29/2021 FINDINGS: Heart size is normal. Interval development of bilateral LOWER lobe infiltrates a RIGHT slightly more confluent than the LEFT. IMPRESSION: New bilateral LOWER lobe infiltrates. Electronically Signed   By: Nolon Nations M.D.   On: 07/01/2021 08:55   ECHOCARDIOGRAM COMPLETE  Result Date: 07/02/2021    ECHOCARDIOGRAM REPORT   Patient  Name:   Ricardo Nash Date of Exam: 07/01/2021 Medical Rec #:  [id number]          Height:       69.0 in Accession #:    [id number]         Weight:       203.0 lb Date of Birth:  1945/08/26          BSA:          2.079 m Patient Age:    29 years           BP:           155/60 mmHg Patient Gender: M                  HR:           82 bpm. Exam Location:  [facility] Procedure: 2D Echo, Color Doppler and Cardiac Doppler Indications:     R06.03 Acute respiratory distress  History:         Patient has no prior history of Echocardiogram examinations.                  CAD, Stroke; Risk Factors:Hypertension, Sleep Apnea, HCL and                  Diabetes.  Sonographer:     Charmayne Sheer Referring Phys:  [id number] Richarda Osmond Diagnosing Phys: Yolonda Kida MD IMPRESSIONS  1. Left ventricular ejection fraction, by estimation, is 40 to 45%. The left ventricle has mildly decreased function. The left ventricle demonstrates global hypokinesis. The left ventricular internal cavity size was mildly to moderately dilated. Left ventricular diastolic parameters were normal.  2. Right ventricular systolic function is low normal. The right ventricular size is mildly enlarged. Mildly increased right ventricular wall thickness.  3. The mitral valve is grossly normal. Trivial mitral valve regurgitation.  4. The aortic valve is grossly normal. Aortic valve regurgitation is not visualized. FINDINGS  Left Ventricle: Left ventricular ejection fraction, by estimation, is 40 to 45%. The left ventricle has mildly decreased function. The left ventricle demonstrates global hypokinesis. The left ventricular internal cavity size was mildly to moderately dilated. There is no left ventricular hypertrophy. Abnormal (paradoxical) septal motion, consistent with left bundle branch block. Left ventricular diastolic parameters were normal. Right Ventricle: The right ventricular size is mildly enlarged. Mildly increased right ventricular wall thickness.  Right ventricular systolic function is low normal. Left Atrium: Left atrial size was normal in size. Right Atrium: Right atrial size was normal in size. Pericardium: There is no evidence of pericardial effusion. Mitral Valve: The mitral valve is grossly normal. Trivial mitral valve regurgitation. MV peak gradient, 4.5 mmHg. The mean mitral valve  gradient is 3.0 mmHg. Tricuspid Valve: The tricuspid valve is grossly normal. Tricuspid valve regurgitation is trivial. Aortic Valve: The aortic valve is grossly normal. Aortic valve regurgitation is not visualized. Aortic valve mean gradient measures 5.0 mmHg. Aortic valve peak gradient measures 9.4 mmHg. Aortic valve area, by VTI measures 1.95 cm. Pulmonic Valve: The pulmonic valve was normal in structure. Pulmonic valve regurgitation is not visualized. Aorta: The ascending aorta was not well visualized. IAS/Shunts: No atrial level shunt detected by color flow Doppler.  LEFT VENTRICLE PLAX 2D LVIDd:         5.17 cm   Diastology LVIDs:         3.99 cm   LV e' medial:    6.20 cm/s LV PW:         0.98 cm   LV E/e' medial:  14.6 LV IVS:        0.87 cm   LV e' lateral:   6.96 cm/s LVOT diam:     2.00 cm   LV E/e' lateral: 13.0 LV SV:         54 LV SV Index:   26 LVOT Area:     3.14 cm  RIGHT VENTRICLE RV Basal diam:  3.54 cm LEFT ATRIUM             Index        RIGHT ATRIUM           Index LA diam:        4.50 cm 2.16 cm/m   RA Area:     11.20 cm LA Vol (A2C):   28.3 ml 13.61 ml/m  RA Volume:   19.20 ml  9.23 ml/m LA Vol (A4C):   67.0 ml 32.23 ml/m LA Biplane Vol: 46.6 ml 22.41 ml/m  AORTIC VALVE                    PULMONIC VALVE AV Area (Vmax):    1.90 cm     PV Vmax:          1.27 m/s AV Area (Vmean):   2.04 cm     PV Vmean:         82.700 cm/s AV Area (VTI):     1.95 cm     PV VTI:           0.208 m AV Vmax:           153.00 cm/s  PV Peak grad:     6.5 mmHg AV Vmean:          97.400 cm/s  PV Mean grad:     3.0 mmHg AV VTI:            0.275 m      PR End Diast Vel:  3.05 msec AV Peak Grad:      9.4 mmHg AV Mean Grad:      5.0 mmHg LVOT Vmax:         92.70 cm/s LVOT Vmean:        63.200 cm/s LVOT VTI:          0.171 m LVOT/AV VTI ratio: 0.62  AORTA Ao Root diam: 3.10 cm MITRAL VALVE MV Area (PHT): 4.86 cm    SHUNTS MV Area VTI:   1.77 cm    Systemic VTI:  0.17 m MV Peak grad:  4.5 mmHg    Systemic Diam: 2.00 cm MV Mean grad:  3.0 mmHg MV Vmax:       1.06  m/s MV Vmean:      80.7 cm/s MV Decel Time: 156 msec MV E velocity: 90.80 cm/s MV A velocity: 90.80 cm/s MV E/A ratio:  1.00 Yolonda Kida MD Electronically signed by Yolonda Kida MD Signature Date/Time: 07/02/2021/3:46:32 PM    Final     Microbiology: Results for orders placed or performed during the hospital encounter of 06/29/21  Resp Panel by RT-PCR (Flu A&B, Covid) Nasopharyngeal Swab     Status: None   Collection Time: 06/29/21 10:00 PM   Specimen: Nasopharyngeal Swab; Nasopharyngeal(NP) swabs in vial transport medium  Result Value Ref Range Status   SARS Coronavirus 2 by RT PCR NEGATIVE NEGATIVE Final    Comment: (NOTE) SARS-CoV-2 target nucleic acids are NOT DETECTED.  The SARS-CoV-2 RNA is generally detectable in upper respiratory specimens during the acute phase of infection. The lowest concentration of SARS-CoV-2 viral copies this assay can detect is 138 copies/mL. A negative result does not preclude SARS-Cov-2 infection and should not be used as the sole basis for treatment or other patient management decisions. A negative result may occur with  improper specimen collection/handling, submission of specimen other than nasopharyngeal swab, presence of viral mutation(s) within the areas targeted by this assay, and inadequate number of viral copies(<138 copies/mL). A negative result must be combined with clinical observations, patient history, and epidemiological information. The expected result is Negative.  Fact Sheet for Patients:  EntrepreneurPulse.com.au  Fact  Sheet for Healthcare Providers:  IncredibleEmployment.be  This test is no t yet approved or cleared by the [place] FDA and  has been authorized for detection and/or diagnosis of SARS-CoV-2 by FDA under an Emergency Use Authorization (EUA). This EUA will remain  in effect (meaning this test can be used) for the duration of the COVID-19 declaration under Section 564(b)(1) of the Act, 21 U.S.C.section 360bbb-3(b)(1), unless the authorization is terminated  or revoked sooner.       Influenza A by PCR NEGATIVE NEGATIVE Final   Influenza B by PCR NEGATIVE NEGATIVE Final    Comment: (NOTE) The Xpert Xpress SARS-CoV-2/FLU/RSV plus assay is intended as an aid in the diagnosis of influenza from Nasopharyngeal swab specimens and should not be used as a sole basis for treatment. Nasal washings and aspirates are unacceptable for Xpert Xpress SARS-CoV-2/FLU/RSV testing.  Fact Sheet for Patients: EntrepreneurPulse.com.au  Fact Sheet for Healthcare Providers: IncredibleEmployment.be  This test is not yet approved or cleared by the [place] FDA and has been authorized for detection and/or diagnosis of SARS-CoV-2 by FDA under an Emergency Use Authorization (EUA). This EUA will remain in effect (meaning this test can be used) for the duration of the COVID-19 declaration under Section 564(b)(1) of the Act, 21 U.S.C. section 360bbb-3(b)(1), unless the authorization is terminated or revoked.  Performed at [facility], [place], [place] [address]   Respiratory (~20 pathogens) panel by PCR     Status: None   Collection Time: 06/29/21 10:00 PM   Specimen: Nasopharyngeal Swab; Respiratory  Result Value Ref Range Status   Adenovirus NOT DETECTED NOT DETECTED Final   Coronavirus 229E NOT DETECTED NOT DETECTED Final    Comment: (NOTE) The Coronavirus on the Respiratory Panel, DOES NOT test for the novel   Coronavirus (2019 nCoV)    Coronavirus HKU1 NOT DETECTED NOT DETECTED Final   Coronavirus NL63 NOT DETECTED NOT DETECTED Final   Coronavirus OC43 NOT DETECTED NOT DETECTED Final   Metapneumovirus NOT DETECTED NOT DETECTED Final   Rhinovirus /  Enterovirus NOT DETECTED NOT DETECTED Final   Influenza A NOT DETECTED NOT DETECTED Final   Influenza B NOT DETECTED NOT DETECTED Final   Parainfluenza Virus 1 NOT DETECTED NOT DETECTED Final   Parainfluenza Virus 2 NOT DETECTED NOT DETECTED Final   Parainfluenza Virus 3 NOT DETECTED NOT DETECTED Final   Parainfluenza Virus 4 NOT DETECTED NOT DETECTED Final   Respiratory Syncytial Virus NOT DETECTED NOT DETECTED Final   Bordetella pertussis NOT DETECTED NOT DETECTED Final   Bordetella Parapertussis NOT DETECTED NOT DETECTED Final   Chlamydophila pneumoniae NOT DETECTED NOT DETECTED Final   Mycoplasma pneumoniae NOT DETECTED NOT DETECTED Final    Comment: Performed at [facility], [place] [address] [place], [place] [address]  Culture, blood (Routine X 2) w Reflex to ID Panel     Status: None   Collection Time: 06/29/21 11:50 PM   Specimen: Left Antecubital; Blood  Result Value Ref Range Status   Specimen Description LEFT ANTECUBITAL  Final   Special Requests   Final    BOTTLES DRAWN AEROBIC AND ANAEROBIC Blood Culture adequate volume   Culture   Final    NO GROWTH 5 DAYS Performed at [facility], [address] [place], [place] [address]    Report Status 07/05/2021 FINAL  Final  Culture, blood (Routine X 2) w Reflex to ID Panel     Status: None   Collection Time: 06/29/21 11:50 PM   Specimen: BLOOD LEFT HAND  Result Value Ref Range Status   Specimen Description BLOOD LEFT HAND  Final   Special Requests   Final    BOTTLES DRAWN AEROBIC AND ANAEROBIC Blood Culture adequate volume   Culture   Final    NO GROWTH 5 DAYS Performed at [facility], [address], [place], [place] [address]    Report Status  07/05/2021 FINAL  Final  Expectorated Sputum Assessment w Gram Stain, Rflx to Resp Cult     Status: None   Collection Time: 06/30/21  1:57 AM   Specimen: Sputum  Result Value Ref Range Status   Specimen Description SPUTUM  Final   Special Requests NONE  Final   Sputum evaluation   Final    Sputum specimen not acceptable for testing.  Please recollect.   RESULT CALLED TO, READ BACK BY AND VERIFIED WITH: MAXUM OMBALA 06/30/21 @ 0623 BY SB Performed at [facility], [address] [place], [place] [address]    Report Status 06/30/2021 FINAL  Final  Expectorated Sputum Assessment w Gram Stain, Rflx to Resp Cult     Status: None   Collection Time: 07/01/21  8:28 AM   Specimen: Expectorated Sputum  Result Value Ref Range Status   Specimen Description EXPECTORATED SPUTUM  Final   Special Requests NONE  Final   Sputum evaluation   Final    THIS SPECIMEN IS ACCEPTABLE FOR SPUTUM CULTURE Performed at [facility], [address] [place], [place] [address]    Report Status 07/01/2021 FINAL  Final  Culture, Respiratory w Gram Stain     Status: None   Collection Time: 07/01/21  8:28 AM  Result Value Ref Range Status   Specimen Description   Final    EXPECTORATED SPUTUM Performed at [facility], [address], [place], [place] [address]    Special Requests   Final    NONE Reflexed from [id number] Performed at [facility], [address], [place], [place] [address]    Gram  Stain   Final    FEW SQUAMOUS EPITHELIAL CELLS PRESENT MODERATE WBC PRESENT, PREDOMINANTLY PMN FEW GRAM POSITIVE COCCI IN PAIRS FEW GRAM POSITIVE RODS    Culture   Final    MODERATE Normal respiratory flora-no Staph aureus or Pseudomonas seen Performed at [facility], [address], [place], [place] [address]    Report Status 07/03/2021 FINAL  Final  MRSA Next Gen by PCR, Nasal     Status: None   Collection Time: 07/01/21  9:32 AM   Specimen: Nasal Mucosa; Nasal Swab   Result Value Ref Range Status   MRSA by PCR Next Gen NOT DETECTED NOT DETECTED Final    Comment: (NOTE) The GeneXpert MRSA Assay (FDA approved for NASAL specimens only), is one component of a comprehensive MRSA colonization surveillance program. It is not intended to diagnose MRSA infection nor to guide or monitor treatment for MRSA infections. Test performance is not FDA approved in patients less than 14 years old. Performed at [facility], [address] [place], [place] [address]     Labs: CBC: Recent Labs  Lab 07/02/21 [id number] 07/05/21 0615  WBC 12.3* 9.2  HGB 11.3* 10.6*  HCT 34.7* 32.5*  MCV 87.8 87.1  PLT 227 623   Basic Metabolic Panel: Recent Labs  Lab 07/04/21 0633 07/05/21 0615 07/06/21 0418 07/07/21 0641 07/08/21 0414  NA 138 137 140 139 140  K 3.8 3.6 3.5 3.6 3.5  CL 100 99 99 100 97*  CO2 _0 GLUCOSE 120* 125* 118* 115* 64*  BUN 48* 38* 34* 27* 29*  CREATININE 1.23 1.00 0.97 0.91 0.96  CALCIUM 8.5* 8.5* 8.4* 8.7* 8.9   Liver Function Tests: No results for input(s): AST, ALT, ALKPHOS, BILITOT, PROT, ALBUMIN in the last 168 hours. CBG: Recent Labs  Lab 07/06/21 1626 07/06/21 2035 07/07/21 0557 07/07/21 1718 07/08/21 0823  GLUCAP 179* 159* 119* 209* 127*    Discharge time spent: greater than 30 minutes.  Signed: Lorella Nimrod, MD [facility] 07/08/2021

## 2021-07-08 NOTE — Progress Notes (Signed)
SATURATION QUALIFICATIONS: (This note is used to comply with regulatory documentation for home oxygen)  Patient Saturations on Room Air at Rest = 93%  Patient Saturations on Room Air while Ambulating = 94%  Patient Saturations on 0 Liters of oxygen while Ambulating = 94%  Please briefly explain why patient needs home oxygen:

## 2021-07-08 NOTE — TOC Transition Note (Signed)
Transition of Care [facility]) - CM/SW Discharge Note   Patient Details  Name: Ricardo Nash MRN: [id number] Date of Birth: Mar 16, 1946  Transition of Care [facility]) CM/SW Contact:  Kerin Salen, RN Phone Number: 07/08/2021, 11:50 AM   Clinical Narrative: Patient to discharge home today with wife Ricardo Nash. Patient and family voices having all home equipment needed to include walker, commode riser, bathroom equipped with shower seat, rails and walk in access. [facility] given physical home address [address], [place]      Final next level of care: [place] Barriers to Discharge: Barriers Resolved   Patient Goals and CMS Choice Patient states their goals for this hospitalization and ongoing recovery are:: To return home. CMS Medicare.gov Compare Post Acute Care list provided to:: Patient Choice offered to / list presented to : Spouse  Discharge Placement                  Name of family member notified: Wife Ricardo Nash Patient and family notified of of transfer: 07/08/21  Discharge Plan and Services                          HH Arranged: PT, OT, RN [place] Agency: [place] Date [facility] Agency Contacted: 07/08/21 Time HH Agency Contacted: 1000 Representative spoke with at [place]: [place] ([place]) Interventions     Readmission Risk Interventions No flowsheet data found.

## 2021-07-08 NOTE — Care Management Important Message (Signed)
Important Message  Patient Details  Name: Ricardo Nash MRN: [id number] Date of Birth: 27-Apr-1946   Medicare Important Message Given:  Yes     Juliann Pulse A Arlanda Shiplett 07/08/2021, 12:23 PM

## 2021-07-08 NOTE — Progress Notes (Signed)
Physical Therapy Treatment Patient Details Name: Ricardo Nash MRN: [id number] DOB: 05/02/1946 Today's Date: 07/08/2021   History of Present Illness Pt is a 76 yo M came in with day history of body aches, chills, cough and congestion starting on his return from a cruise to the [place]. Workup showed PNA, acute respiratory failure. PMH of PAF, DM, HTN.    PT Comments    Patient is agreeable to PT. He reports just walking with nursing in hallway. Sp02 92% on room air after LE exercise. Patient educated on mobility tips, using rolling walker with ambulation, energy conservation techniques, and walking schedule.  Patient anticipated to d/c home later today. Recommend HHPT follow up.    Recommendations for follow up therapy are one component of a multi-disciplinary discharge planning process, led by the attending physician.  Recommendations may be updated based on patient status, additional functional criteria and insurance authorization.  Follow Up Recommendations  Home health PT     Assistance Recommended at Discharge PRN  Patient can return home with the following A little help with walking and/or transfers;A little help with bathing/dressing/bathroom;Assistance with cooking/housework;Direct supervision/assist for medications management;Help with stairs or ramp for entrance;Assist for transportation   Equipment Recommendations  None recommended by PT    Recommendations for Other Services       Precautions / Restrictions Precautions Precautions: Fall Restrictions Weight Bearing Restrictions: No     Mobility  Bed Mobility               General bed mobility comments: not addressed as patient sitting up on arrival and post session    Transfers                        Ambulation/Gait               General Gait Details: patient declined as he just ambulated in hallway with nursing staff   Stairs             Wheelchair Mobility    Modified  Rankin (Stroke Patients Only)       Balance                                            Cognition Arousal/Alertness: Awake/alert Behavior During Therapy: WFL for tasks assessed/performed Overall Cognitive Status: Within Functional Limits for tasks assessed                                          Exercises General Exercises - Lower Extremity Ankle Circles/Pumps: AROM, Strengthening, Both, 10 reps, Seated Long Arc Quad: AAROM, Strengthening, Both, 10 reps, Seated Other Exercises Other Exercises: cues for exercise technique for strenthening. Sp02 92% on room air after exercises    General Comments General comments (skin integrity, edema, etc.): patient educated on tips for getting in and out of bed at home with and without bed rail (which has been purchased for home per wife). educated on energy conservation techniques and walking for endurance, use of rolling walker for safety.      Pertinent Vitals/Pain Pain Assessment Pain Assessment: No/denies pain    Home Living  Prior Function            PT Goals (current goals can now be found in the care plan section) Acute Rehab PT Goals Patient Stated Goal: return to baseline PT Goal Formulation: With patient Time For Goal Achievement: 07/18/21 Potential to Achieve Goals: Good Progress towards PT goals: Progressing toward goals    Frequency    Min 2X/week      PT Plan Current plan remains appropriate    Co-evaluation              AM-PAC PT "6 Clicks" Mobility   Outcome Measure  Help needed turning from your back to your side while in a flat bed without using bedrails?: A Little Help needed moving from lying on your back to sitting on the side of a flat bed without using bedrails?: A Little Help needed moving to and from a bed to a chair (including a wheelchair)?: A Little Help needed standing up from a chair using your arms (e.g.,  wheelchair or bedside chair)?: A Little Help needed to walk in hospital room?: A Little Help needed climbing 3-5 steps with a railing? : A Little 6 Click Score: 18    End of Session   Activity Tolerance: Patient tolerated treatment well Patient left: with family/visitor present (sitting up on edge of bed, OT present.)   PT Visit Diagnosis: Other abnormalities of gait and mobility (R26.89);Difficulty in walking, not elsewhere classified (R26.2);Muscle weakness (generalized) (M62.81)     Time: 1043-1100 PT Time Calculation (min) (ACUTE ONLY): 17 min  Charges:  $Therapeutic Activity: 8-22 mins                    Ricardo Nash, PT, MPT    Ricardo Nash 07/08/2021, 12:47 PM

## 2021-07-09 ENCOUNTER — Telehealth: Payer: Self-pay

## 2021-07-09 NOTE — Telephone Encounter (Signed)
Transition Care Management Unsuccessful Follow-up Telephone Call  Date of discharge and from where:  07/08/21 [facility]  Attempts:  1st Attempt  Reason for unsuccessful TCM follow-up call:  Unable to reach patient. HFU appointment scheduled 07/11/21 @ 8:30. Will follow.

## 2021-07-10 NOTE — Telephone Encounter (Signed)
Transition Care Management Follow-up Telephone Call ?Date of discharge and from where: 07/08/21 [facility]. ?How have you been since you were released from the hospital? Monitoring oxygen level often, 90 room air or higher. Weak in the legs. Sleeping real good.  Productive cough with clear to yellow phlegm. Spirometer in use 5x daily. Appetite decreased but eating. Staying hydrated. Urinating frequently, slow to start. Denies chest pain, n/v/d, fever, shortness of breath when resting and all other alarming symptoms.  ?Any questions or concerns? No ? ?Items Reviewed: ?Did the pt receive and understand the discharge instructions provided? Yes  ?Medications obtained and verified? Yes  ?Any new allergies since your discharge? No  ?Dietary orders reviewed? Yes ?Do you have support at home? Yes  ? ?Home Care and Equipment/Supplies: ?Were home health services ordered? Yes ?If so, what is the name of the agency? OT/PT started today. [facility] ? ?Functional Questionnaire: (I = Independent and D = Dependent) ?ADLs: Wife assist as needed ? ?Bathing/Dressing- I ? ?Meal Prep- wife  ? ?Eating- I ? ?Maintaining continence- I ? ?Transferring/Ambulation- Walker, cane ? ?Managing Meds- wife assist ? ?Follow up appointments reviewed: ? ?PCP Hospital f/u appt confirmed? Yes  Scheduled to see PCP on 07/11/21 @ 8:30. ?[facility] f/u appt confirmed? Yes  Scheduled to see Pulmonary on 07/12/21. ?Are transportation arrangements needed? No  ?If their condition worsens, is the pt aware to call PCP or go to the Emergency Dept.? Yes ?Was the patient provided with contact information for the PCP's office or ED? Yes ?Was to pt encouraged to call back with questions or concerns? Yes  ?

## 2021-07-11 ENCOUNTER — Other Ambulatory Visit: Payer: Self-pay

## 2021-07-11 ENCOUNTER — Encounter: Payer: Self-pay | Admitting: Internal Medicine

## 2021-07-11 ENCOUNTER — Ambulatory Visit: Payer: Medicare PPO | Admitting: Internal Medicine

## 2021-07-11 VITALS — BP 144/84 | HR 76 | Temp 98.2°F | Resp 12 | Ht 69.0 in | Wt 203.0 lb

## 2021-07-11 DIAGNOSIS — J189 Pneumonia, unspecified organism: Secondary | ICD-10-CM | POA: Diagnosis not present

## 2021-07-11 DIAGNOSIS — I2510 Atherosclerotic heart disease of native coronary artery without angina pectoris: Secondary | ICD-10-CM | POA: Diagnosis not present

## 2021-07-11 DIAGNOSIS — R197 Diarrhea, unspecified: Secondary | ICD-10-CM

## 2021-07-11 DIAGNOSIS — I480 Paroxysmal atrial fibrillation: Secondary | ICD-10-CM

## 2021-07-11 DIAGNOSIS — I10 Essential (primary) hypertension: Secondary | ICD-10-CM | POA: Diagnosis not present

## 2021-07-11 DIAGNOSIS — I700 Atherosclerosis of aorta: Secondary | ICD-10-CM

## 2021-07-11 DIAGNOSIS — E7800 Pure hypercholesterolemia, unspecified: Secondary | ICD-10-CM

## 2021-07-11 DIAGNOSIS — G4733 Obstructive sleep apnea (adult) (pediatric): Secondary | ICD-10-CM | POA: Diagnosis not present

## 2021-07-11 DIAGNOSIS — R531 Weakness: Secondary | ICD-10-CM | POA: Diagnosis not present

## 2021-07-11 DIAGNOSIS — E1159 Type 2 diabetes mellitus with other circulatory complications: Secondary | ICD-10-CM

## 2021-07-11 DIAGNOSIS — F439 Reaction to severe stress, unspecified: Secondary | ICD-10-CM

## 2021-07-11 DIAGNOSIS — K219 Gastro-esophageal reflux disease without esophagitis: Secondary | ICD-10-CM

## 2021-07-11 DIAGNOSIS — E049 Nontoxic goiter, unspecified: Secondary | ICD-10-CM

## 2021-07-11 LAB — CBC WITH DIFFERENTIAL/PLATELET
Basophils Absolute: 0.1 10*3/uL (ref 0.0–0.1)
Basophils Relative: 0.6 % (ref 0.0–3.0)
Eosinophils Absolute: 0.1 10*3/uL (ref 0.0–0.7)
Eosinophils Relative: 0.8 % (ref 0.0–5.0)
HCT: 35 % — ABNORMAL LOW (ref 39.0–52.0)
Hemoglobin: 11.7 g/dL — ABNORMAL LOW (ref 13.0–17.0)
Lymphocytes Relative: 14.5 % (ref 12.0–46.0)
Lymphs Abs: 1.8 10*3/uL (ref 0.7–4.0)
MCHC: 33.5 g/dL (ref 30.0–36.0)
MCV: 87.3 fl (ref 78.0–100.0)
Monocytes Absolute: 0.5 10*3/uL (ref 0.1–1.0)
Monocytes Relative: 4.3 % (ref 3.0–12.0)
Neutro Abs: 10.1 10*3/uL — ABNORMAL HIGH (ref 1.4–7.7)
Neutrophils Relative %: 79.8 % — ABNORMAL HIGH (ref 43.0–77.0)
Platelets: 491 10*3/uL — ABNORMAL HIGH (ref 150.0–400.0)
RBC: 4.01 Mil/uL — ABNORMAL LOW (ref 4.22–5.81)
RDW: 14.3 % (ref 11.5–15.5)
WBC: 12.7 10*3/uL — ABNORMAL HIGH (ref 4.0–10.5)

## 2021-07-11 LAB — TSH: TSH: 1.1 u[IU]/mL (ref 0.35–5.50)

## 2021-07-11 LAB — BASIC METABOLIC PANEL
BUN: 25 mg/dL — ABNORMAL HIGH (ref 6–23)
CO2: 27 mEq/L (ref 19–32)
Calcium: 9.7 mg/dL (ref 8.4–10.5)
Chloride: 101 mEq/L (ref 96–112)
Creatinine, Ser: 1.12 mg/dL (ref 0.40–1.50)
GFR: 63.54 mL/min (ref >60.00)
Glucose, Bld: 162 mg/dL — ABNORMAL HIGH (ref 70–99)
Potassium: 4.5 mEq/L (ref 3.5–5.1)
Sodium: 140 mEq/L (ref 135–145)

## 2021-07-11 NOTE — Progress Notes (Signed)
Patient ID: Dorin Stooksbury, male   DOB: 10/07/1945, 76 y.o.   MRN: [id number]   Subjective:    Patient ID: Jakayden Cancio, male    DOB: 1945-11-21, 76 y.o.   MRN: [id number]  This visit occurred during the SARS-CoV-2 public health emergency.  Safety protocols were in place, including screening questions prior to the visit, additional usage of staff PPE, and extensive cleaning of exam room while observing appropriate contact time as indicated for disinfecting solutions.   Chief Complaint  Patient presents with   Hospitalization Follow-up    Double Pneumonia    .   HPI Here for hospital follow up.  Admitted 06/29/21 - 07/08/21 - with acute hypoxic respiratory failure and diagnosed with pneumonia.  CTA: Multifocal pneumonia.  Small bilateral pleural effusions. Respiratory motion artifact limits evaluation of the segmental and subsegmental pulmonary arteries. Within this limitation there is no evidence of pulmonary embolism to the level of the lobar arteries. Multinodular goiter with intrathoracic extension, increased in size in comparison to prior chest CT in March 2012. Treated with cefepime and zithromax.  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  Pulmonary consulted.  Added low dose steroid and lasix.  Medication adjusted - per note - switched lisionpril with maxide.  Discharged on doxycycline and oral prednisone.  Also discharged on lasix.  Oxygen weaned. Also recommended spirometry and flutter valve.  Sputum cultures - normal respiratory flora.  MRSA swab negative. Respiratory viral panel negative. Sugars elevated - was prescribed semglee at bedtime - while in the hospital.  Not taking now. Also recommended continuing cpap.  He reports he is doing better.  Breathing is better.  Increased fatigue.  Home health nurse and PT have evaluated.  Discussed plan to continue.  States not taking lasix now.  Is taking triam/hctz.  Has f/u with pulmonary tomorrow.     Past Medical History:   Diagnosis Date   C. difficile colitis    Coronary artery disease    Depression    Diabetes mellitus ([place])    Diverticulitis    Diverticulosis    Dysrhythmia    PAF   Environmental allergies    GERD (gastroesophageal reflux disease)    Goiter    intrathoracic, s/p benign biopsy (Dr Harlow Asa)   Hypercholesterolemia    Hypertension    Osteoarthritis    cervical spine, lumbar spine   Paroxysmal atrial fibrillation ([facility])    Pneumonia    PONV (postoperative nausea and vomiting)    Sleep apnea    CPAP @ NIGHT   Stroke [facility])    TIA (transient ischemic attack)    Past Surgical History:  Procedure Laterality Date   BACK SURGERY  8/08   s/p fusion of L4-L5   capsule endoscopy     CATARACT EXTRACTION  2011   Dr. Herbert Deaner   CERVICAL DISC SURGERY  2002   COLONOSCOPY WITH PROPOFOL N/A 07/20/2017   Procedure: COLONOSCOPY WITH PROPOFOL;  Surgeon: Manya Silvas, MD;  Location: [facility] ENDOSCOPY;  Service: Endoscopy;  Laterality: N/A;   EYE SURGERY     cataract bilateral 10/1999   eyelid reduction     Dr. Kerby Less HERNIA REPAIR  1991   Dr, Cassell Clement LAMINECTOMY/DECOMPRESSION MICRODISCECTOMY N/A 10/15/2020   Procedure: Laminectomy - Lumbar Two-Lumbar Three - Lumbar Three-Lumbar Four with sublaminar decompression;  Surgeon: Eustace Moore, MD;  Location: [facility];  Service: Neurosurgery;  Laterality: N/A;  Laminectomy - Lumbar Two-Lumbar Three - Lumbar Three-Lumbar  Four with sublaminar decompression   NOSE SURGERY     turbinate reduction   SEPTOPLASTY  1975   SHOULDER SURGERY  2000   rotator cuff   Family History  Problem Relation Age of Onset   Congestive Heart Failure Father    Heart disease Father        myocardial infarction   Rheumatic fever Father        valvular disease   Heart disease Mother        s/p CABG (age 53)   Kidney disease Sister    Colon cancer Neg Hx    Prostate cancer Neg Hx    Social History   Socioeconomic History   Marital status: Married     Spouse name: Not on file   Number of children: 2   Years of education: Not on file   Highest education level: Not on file  Occupational History   Occupation: retired Pharmacist, hospital  Tobacco Use   Smoking status: Former   Smokeless tobacco: Never  Scientific laboratory technician Use: Never used  Substance and Sexual Activity   Alcohol use: Yes    Alcohol/week: 0.0 standard drinks    Comment: occasional   Drug use: No   Sexual activity: Not Currently  Other Topics Concern   Not on file  Social History Narrative   Not on file   Social Determinants of Health   Financial Resource Strain: Low Risk    Difficulty of Paying Living Expenses: Not hard at all  Food Insecurity: No Food Insecurity   Worried About Charity fundraiser in the Last Year: Never true   Bear in the Last Year: Never true  Transportation Needs: No Transportation Needs   Lack of Transportation (Medical): No   Lack of Transportation (Non-Medical): No  Physical Activity: Sufficiently Active   Days of Exercise per Week: 5 days   Minutes of Exercise per Session: 60 min  Stress: No Stress Concern Present   Feeling of Stress : Not at all  Social Connections: Unknown   Frequency of Communication with Friends and Family: Not on file   Frequency of Social Gatherings with Friends and Family: Not on file   Attends Religious Services: Not on file   Active Member of Clubs or Organizations: Not on file   Attends Archivist Meetings: Not on file   Marital Status: Married     Review of Systems  Constitutional:  Positive for appetite change and fatigue.       Decrease weight.   HENT:  Negative for sinus pressure.   Respiratory:  Positive for cough. Negative for chest tightness.        Breathing better.   Cardiovascular:  Negative for chest pain, palpitations and leg swelling.  Gastrointestinal:  Negative for abdominal pain, nausea and vomiting.       Loose stool over the last couple of days.   Genitourinary:   Negative for difficulty urinating and dysuria.  Musculoskeletal:  Negative for joint swelling and myalgias.  Skin:  Negative for color change and rash.  Neurological:  Negative for dizziness, light-headedness and headaches.  Psychiatric/Behavioral:  Negative for agitation and dysphoric mood.       Objective:     BP (!) 144/84    Pulse 76    Temp 98.2 F (36.8 C) (Oral)    Resp 12    Ht '5\' 9"'  (1.753 m)    Wt 203 lb (92.1 kg)  SpO2 96%    BMI 29.98 kg/m  Wt Readings from Last 3 Encounters:  07/11/21 203 lb (92.1 kg)  07/03/21 203 lb 4.2 oz (92.2 kg)  06/21/21 203 lb (92.1 kg)    Physical Exam Constitutional:      General: He is not in acute distress.    Appearance: Normal appearance. He is well-developed.  HENT:     Head: Normocephalic and atraumatic.     Right Ear: External ear normal.     Left Ear: External ear normal.  Eyes:     General: No scleral icterus.       Right eye: No discharge.        Left eye: No discharge.  Cardiovascular:     Rate and Rhythm: Normal rate and regular rhythm.  Pulmonary:     Effort: Pulmonary effort is normal. No respiratory distress.     Breath sounds: Normal breath sounds.  Abdominal:     General: Bowel sounds are normal.     Palpations: Abdomen is soft.     Tenderness: There is no abdominal tenderness.  Musculoskeletal:        General: No swelling or tenderness.     Cervical back: Neck supple. No tenderness.  Lymphadenopathy:     Cervical: No cervical adenopathy.  Skin:    Findings: No erythema or rash.  Neurological:     Mental Status: He is alert.  Psychiatric:        Mood and Affect: Mood normal.        Behavior: Behavior normal.     Outpatient Encounter Medications as of 07/11/2021  Medication Sig   ACCU-CHEK GUIDE test strip USE AS DIRECTED TO TEST BLOOD GLUCOSE LEVELS TWICE DAILY   ALPRAZolam (XANAX) 0.25 MG tablet TAKE 1 TABLET BY MOUTH ONCE DAILY AS NEEDED.   amLODipine (NORVASC) 10 MG tablet TAKE 1 TABLET BY MOUTH  ONCE DAILY   Ascorbic Acid (VITAMIN C) 100 MG tablet Take 100 mg by mouth in the morning.   aspirin EC 81 MG tablet Take 81 mg by mouth every evening. Swallow whole.   atorvastatin (LIPITOR) 20 MG tablet Take 1 tablet (20 mg total) by mouth daily.   Azelastine-Fluticasone (DYMISTA) 137-50 MCG/ACT SUSP Place 1 spray into the nose in the morning and at bedtime.   benzonatate (TESSALON) 100 MG capsule Take 1 capsule (100 mg total) by mouth 3 (three) times daily as needed for cough.   blood glucose meter kit and supplies Dispense based on patient and insurance preference. Use up to four times daily as directed. (FOR ICD-10 E10.9, E11.9).   carvedilol (COREG) 25 MG tablet Take 1 tablet (25 mg total) by mouth 2 (two) times daily with a meal.   chlorpheniramine-HYDROcodone 10-8 MG/5ML Take 5 mLs by mouth every 12 (twelve) hours as needed for cough.   doxycycline (VIBRA-TABS) 100 MG tablet Take 1 tablet (100 mg total) by mouth every 12 (twelve) hours for 6 days.   Dulaglutide (TRULICITY) 1.61 WR/6.0AV SOPN INJECT 0.75MG SUBQ ONCE A WEEK   guaiFENesin (MUCINEX) 600 MG 12 hr tablet Take 2 tablets (1,200 mg total) by mouth 2 (two) times daily. (Patient taking differently: Take 600 mg by mouth 2 (two) times daily.)   levocetirizine (XYZAL) 5 MG tablet Take 1 tablet (5 mg total) by mouth every evening.   lisinopril (ZESTRIL) 40 MG tablet TAKE 1 TABLET BY MOUTH ONCE DAILY   meloxicam (MOBIC) 15 MG tablet Take 15 mg by mouth daily.   metFORMIN (GLUCOPHAGE)  1000 MG tablet TAKE 1 TABLET BY MOUTH TWICE DAILY WITH MEALS   montelukast (SINGULAIR) 10 MG tablet Take 1 tablet (10 mg total) by mouth at bedtime.   Multiple Vitamin (MULTIVITAMIN WITH MINERALS) TABS tablet Take 1 tablet by mouth in the morning.   pantoprazole (PROTONIX) 40 MG tablet TAKE 1 TABLET BY MOUTH ONCE DAILY   pioglitazone (ACTOS) 15 MG tablet Take 1 tablet (15 mg total) by mouth daily.   predniSONE (DELTASONE) 10 MG tablet Take 2 tablets (20 mg  total) by mouth daily with breakfast. For 3 days, then take 1 tablet for 3 days and then stop it.   sertraline (ZOLOFT) 100 MG tablet Take 1 tablet (100 mg total) by mouth daily.   tamsulosin (FLOMAX) 0.4 MG CAPS capsule Take 1 capsule (0.4 mg total) by mouth daily with breakfast.   testosterone cypionate (DEPOTESTOSTERONE CYPIONATE) 200 MG/ML injection Inject 1 mL (200 mg total) into the muscle every 14 (fourteen) days.   traZODone (DESYREL) 50 MG tablet Take 1.5 tablets (75 mg total) by mouth at bedtime.   triamterene-hydrochlorothiazide (MAXZIDE-25) 37.5-25 MG tablet Take 2 tablets by mouth daily.   No facility-administered encounter medications on file as of 07/11/2021.     Lab Results  Component Value Date   WBC 12.7 (H) 07/11/2021   HGB 11.7 (L) 07/11/2021   HCT 35.0 (L) 07/11/2021   PLT 491.0 (H) 07/11/2021   GLUCOSE 162 (H) 07/11/2021   CHOL 152 06/10/2021   TRIG 251.0 (H) 06/10/2021   HDL 34.30 (L) 06/10/2021   LDLDIRECT 91.0 06/10/2021   LDLCALC 58 11/21/2013   ALT 25 06/29/2021   AST 28 06/29/2021   NA 140 07/11/2021   K 4.5 07/11/2021   CL 101 07/11/2021   CREATININE 1.12 07/11/2021   BUN 25 (H) 07/11/2021   CO2 27 07/11/2021   TSH 1.10 07/11/2021   PSA 0.82 06/10/2019   INR 0.9 10/11/2020   HGBA1C 7.0 (H) 06/10/2021   MICROALBUR 1.5 10/18/2019    CT Angio Chest Pulmonary Embolism (PE) W or WO Contrast  Result Date: 07/01/2021 CLINICAL DATA:  Pulmonary embolism (PE) suspected, unknown D-dimer EXAM: CT ANGIOGRAPHY CHEST WITH CONTRAST TECHNIQUE: Multidetector CT imaging of the chest was performed using the standard protocol during bolus administration of intravenous contrast. Multiplanar CT image reconstructions and MIPs were obtained to evaluate the vascular anatomy. RADIATION DOSE REDUCTION: This exam was performed according to the departmental dose-optimization program which includes automated exposure control, adjustment of the mA and/or kV according to patient size  and/or use of iterative reconstruction technique. CONTRAST:  29m OMNIPAQUE IOHEXOL 350 MG/ML SOLN COMPARISON:  Radiographs 07/01/2021, 06/29/2021, 10/11/2020 FINDINGS: Cardiovascular: Respiratory motion artifact limits evaluation of the segmental and subsegmental vessels. There is no evidence of pulmonary embolism to the level of the lobar arteries. Mild cardiomegaly. No pericardial disease. Coronary artery calcifications. Mild atherosclerotic calcifications of the aortic arch and descending aorta. Mediastinum/Nodes: There are a few prominent mediastinal and hilar lymph nodes, likely reactive. Enlarged, multinodular goiter with mediastinal extension, increased in size in comparison due prior chest CT and March 2012. Lungs/Pleura: High there is multifocal consolidation and ground-glass opacities bilaterally, most confluent in the lower lobes and right middle lobe into a slightly lesser degree in the upper lobes. Small bilateral pleural effusions. No pneumothorax. Upper Abdomen: Airways are patent. Musculoskeletal: Chronic anterior compression fracture of T12. No acute osseous abnormality. Diffuse idiopathic skeletal hyperostosis. Review of the MIP images confirms the above findings. IMPRESSION: Multifocal pneumonia.  Small bilateral  pleural effusions. Respiratory motion artifact limits evaluation of the segmental and subsegmental pulmonary arteries. Within this limitation there is no evidence of pulmonary embolism to the level of the lobar arteries. Multinodular goiter with intrathoracic extension, increased in size in comparison to prior chest CT in March 2012. Aortic Atherosclerosis (ICD10-I70.0). Electronically Signed   By: Maurine Simmering M.D.   On: 07/01/2021 10:04   DG Chest Port 1 View  Result Date: 07/01/2021 CLINICAL DATA:  Dyspnea. EXAM: PORTABLE CHEST 1 VIEW COMPARISON:  06/29/2021 FINDINGS: Heart size is normal. Interval development of bilateral LOWER lobe infiltrates a RIGHT slightly more confluent than  the LEFT. IMPRESSION: New bilateral LOWER lobe infiltrates. Electronically Signed   By: Nolon Nations M.D.   On: 07/01/2021 08:55   ECHOCARDIOGRAM COMPLETE  Result Date: 07/02/2021    ECHOCARDIOGRAM REPORT   Patient Name:   THOMPSON MCKIM Date of Exam: 07/01/2021 Medical Rec #:  [id number]          Height:       69.0 in Accession #:    [id number]         Weight:       203.0 lb Date of Birth:  06-Oct-1945          BSA:          2.079 m Patient Age:    60 years           BP:           155/60 mmHg Patient Gender: M                  HR:           82 bpm. Exam Location:  [facility] Procedure: 2D Echo, Color Doppler and Cardiac Doppler Indications:     R06.03 Acute respiratory distress  History:         Patient has no prior history of Echocardiogram examinations.                  CAD, Stroke; Risk Factors:Hypertension, Sleep Apnea, HCL and                  Diabetes.  Sonographer:     Charmayne Sheer Referring Phys:  [id number] Richarda Osmond Diagnosing Phys: Yolonda Kida MD IMPRESSIONS  1. Left ventricular ejection fraction, by estimation, is 40 to 45%. The left ventricle has mildly decreased function. The left ventricle demonstrates global hypokinesis. The left ventricular internal cavity size was mildly to moderately dilated. Left ventricular diastolic parameters were normal.  2. Right ventricular systolic function is low normal. The right ventricular size is mildly enlarged. Mildly increased right ventricular wall thickness.  3. The mitral valve is grossly normal. Trivial mitral valve regurgitation.  4. The aortic valve is grossly normal. Aortic valve regurgitation is not visualized. FINDINGS  Left Ventricle: Left ventricular ejection fraction, by estimation, is 40 to 45%. The left ventricle has mildly decreased function. The left ventricle demonstrates global hypokinesis. The left ventricular internal cavity size was mildly to moderately dilated. There is no left ventricular hypertrophy. Abnormal (paradoxical)  septal motion, consistent with left bundle branch block. Left ventricular diastolic parameters were normal. Right Ventricle: The right ventricular size is mildly enlarged. Mildly increased right ventricular wall thickness. Right ventricular systolic function is low normal. Left Atrium: Left atrial size was normal in size. Right Atrium: Right atrial size was normal in size. Pericardium: There is no evidence of pericardial effusion. Mitral Valve: The mitral valve is grossly  normal. Trivial mitral valve regurgitation. MV peak gradient, 4.5 mmHg. The mean mitral valve gradient is 3.0 mmHg. Tricuspid Valve: The tricuspid valve is grossly normal. Tricuspid valve regurgitation is trivial. Aortic Valve: The aortic valve is grossly normal. Aortic valve regurgitation is not visualized. Aortic valve mean gradient measures 5.0 mmHg. Aortic valve peak gradient measures 9.4 mmHg. Aortic valve area, by VTI measures 1.95 cm. Pulmonic Valve: The pulmonic valve was normal in structure. Pulmonic valve regurgitation is not visualized. Aorta: The ascending aorta was not well visualized. IAS/Shunts: No atrial level shunt detected by color flow Doppler.  LEFT VENTRICLE PLAX 2D LVIDd:         5.17 cm   Diastology LVIDs:         3.99 cm   LV e' medial:    6.20 cm/s LV PW:         0.98 cm   LV E/e' medial:  14.6 LV IVS:        0.87 cm   LV e' lateral:   6.96 cm/s LVOT diam:     2.00 cm   LV E/e' lateral: 13.0 LV SV:         54 LV SV Index:   26 LVOT Area:     3.14 cm  RIGHT VENTRICLE RV Basal diam:  3.54 cm LEFT ATRIUM             Index        RIGHT ATRIUM           Index LA diam:        4.50 cm 2.16 cm/m   RA Area:     11.20 cm LA Vol (A2C):   28.3 ml 13.61 ml/m  RA Volume:   19.20 ml  9.23 ml/m LA Vol (A4C):   67.0 ml 32.23 ml/m LA Biplane Vol: 46.6 ml 22.41 ml/m  AORTIC VALVE                    PULMONIC VALVE AV Area (Vmax):    1.90 cm     PV Vmax:          1.27 m/s AV Area (Vmean):   2.04 cm     PV Vmean:         82.700 cm/s AV  Area (VTI):     1.95 cm     PV VTI:           0.208 m AV Vmax:           153.00 cm/s  PV Peak grad:     6.5 mmHg AV Vmean:          97.400 cm/s  PV Mean grad:     3.0 mmHg AV VTI:            0.275 m      PR End Diast Vel: 3.05 msec AV Peak Grad:      9.4 mmHg AV Mean Grad:      5.0 mmHg LVOT Vmax:         92.70 cm/s LVOT Vmean:        63.200 cm/s LVOT VTI:          0.171 m LVOT/AV VTI ratio: 0.62  AORTA Ao Root diam: 3.10 cm MITRAL VALVE MV Area (PHT): 4.86 cm    SHUNTS MV Area VTI:   1.77 cm    Systemic VTI:  0.17 m MV Peak grad:  4.5 mmHg    Systemic Diam: 2.00 cm MV  Mean grad:  3.0 mmHg MV Vmax:       1.06 m/s MV Vmean:      80.7 cm/s MV Decel Time: 156 msec MV E velocity: 90.80 cm/s MV A velocity: 90.80 cm/s MV E/A ratio:  1.00 Dwayne Prince Rome MD Electronically signed by Yolonda Kida MD Signature Date/Time: 07/02/2021/3:46:32 PM    Final        Assessment & Plan:   Problem List Items Addressed This Visit     Aortic atherosclerosis ([place])    Continues on lipitor.       CAD (coronary artery disease), native coronary artery    Continue aspirin, lipitor and carvedilol.  No chest paion.       Diabetes mellitus ([place])    Reports sugars have been elevated since being sick and on prednisone.  Was covered with sliding scale insulin in the hospital. Continue metformin and trulicity.  Follow sugars.  Hold on making adjustments in medication.  Tapering off prednisone.        Diarrhea    Start probiotics.  Has been on increased amount of abx.  Follow. Notify me if persistent.        GERD (gastroesophageal reflux disease)    No upper symptoms reported.  Continue protonix.       Hypercholesterolemia    Continue lipitor.  Low cholesterol diet and exercise.  Follow lipid panel and liver function tests.        Hypertension    Blood pressure elevated.  Recheck improved.  Continue carvedilol and amlodipine.  Lisinopril was changed to triam/hctz in hospital.  Will continue for now.  Has f/u  with pulmonary tomorrow.  Plans to change back to ACE inhibitor as outpatient.  Follow pressures.  Follow metabolic panel.       Intrathoracic goiter    Recent CT -revealed multinodular goiter with intrathoracic extension, increased in size in comparison to prior chest CT in March 2012.  Had been evaluated by Dr. Harlow Asa previously.  Biopsy negative.  Once above acute issues sorted through we will have him follow-up regarding his thyroid.      Obstructive sleep apnea    Continue with CPAP.      Paroxysmal atrial fibrillation ([facility])    Apparently documented in hospital. Is on carvedilol and aspirin.  Due to f/u with cardiology.  Appears to be in SR today.  Follow.       Pneumonia - Primary    Sputum cultures revealed normal respiratory flora.  MRSA swab negative.  Respiratory viral panel negative.  Treated with cefepime and Zithromax.  Was discharged on doxycycline and steroids.  Breathing is better.  Off oxygen.  Has follow-up with pulmonary tomorrow.      Relevant Orders   CBC w/Diff (Completed)   Basic Metabolic Panel (BMET) (Completed)   TSH (Completed)   Stress    Continue zoloft.  Increased stress related to recent admission, being sick, etc.  Follow.        Weakness    Increased weakness.  Discussed.  Recent hospitalization, pneumonia, etc.  Discussed home health PT.  Follow.         Einar Pheasant, MD

## 2021-07-12 ENCOUNTER — Other Ambulatory Visit: Payer: Self-pay | Admitting: Internal Medicine

## 2021-07-12 DIAGNOSIS — D509 Iron deficiency anemia, unspecified: Secondary | ICD-10-CM

## 2021-07-12 DIAGNOSIS — I10 Essential (primary) hypertension: Secondary | ICD-10-CM

## 2021-07-12 DIAGNOSIS — R0602 Shortness of breath: Secondary | ICD-10-CM | POA: Diagnosis not present

## 2021-07-12 DIAGNOSIS — J189 Pneumonia, unspecified organism: Secondary | ICD-10-CM | POA: Diagnosis not present

## 2021-07-12 NOTE — Progress Notes (Signed)
Orders placed for f/u labs.  

## 2021-07-14 ENCOUNTER — Encounter: Payer: Self-pay | Admitting: Internal Medicine

## 2021-07-14 DIAGNOSIS — R531 Weakness: Secondary | ICD-10-CM | POA: Insufficient documentation

## 2021-07-14 NOTE — Assessment & Plan Note (Signed)
Continue with CPAP

## 2021-07-14 NOTE — Assessment & Plan Note (Signed)
Start probiotics.  Has been on increased amount of abx.  Follow. Notify me if persistent.   ?

## 2021-07-14 NOTE — Assessment & Plan Note (Signed)
Continue lipitor.  Low cholesterol diet and exercise.  Follow lipid panel and liver function tests.   

## 2021-07-14 NOTE — Assessment & Plan Note (Addendum)
Apparently documented in hospital. Is on carvedilol and aspirin.  Due to f/u with cardiology.  Appears to be in SR today.  Follow.  ?

## 2021-07-14 NOTE — Assessment & Plan Note (Signed)
Reports sugars have been elevated since being sick and on prednisone.  Was covered with sliding scale insulin in the hospital. Continue metformin and trulicity.  Follow sugars.  Hold on making adjustments in medication.  Tapering off prednisone.   ?

## 2021-07-14 NOTE — Assessment & Plan Note (Signed)
Recent CT -revealed multinodular goiter with intrathoracic extension, increased in size in comparison to prior chest CT in March 2012.  Had been evaluated by Dr. Harlow Asa previously.  Biopsy negative.  Once above acute issues sorted through we will have him follow-up regarding his thyroid. ?

## 2021-07-14 NOTE — Assessment & Plan Note (Signed)
Continue zoloft.  Increased stress related to recent admission, being sick, etc.  Follow.   ?

## 2021-07-14 NOTE — Assessment & Plan Note (Signed)
Sputum cultures revealed normal respiratory flora.  MRSA swab negative.  Respiratory viral panel negative.  Treated with cefepime and Zithromax.  Was discharged on doxycycline and steroids.  Breathing is better.  Off oxygen.  Has follow-up with pulmonary tomorrow. ?

## 2021-07-14 NOTE — Assessment & Plan Note (Signed)
Blood pressure elevated.  Recheck improved.  Continue carvedilol and amlodipine.  Lisinopril was changed to triam/hctz in hospital.  Will continue for now.  Has f/u with pulmonary tomorrow.  Plans to change back to ACE inhibitor as outpatient.  Follow pressures.  Follow metabolic panel.  ?

## 2021-07-14 NOTE — Assessment & Plan Note (Signed)
Continues on lipitor.  

## 2021-07-14 NOTE — Assessment & Plan Note (Signed)
No upper symptoms reported.  Continue protonix.  

## 2021-07-14 NOTE — Assessment & Plan Note (Signed)
Increased weakness.  Discussed.  Recent hospitalization, pneumonia, etc.  Discussed home health PT.  Follow.  ?

## 2021-07-14 NOTE — Assessment & Plan Note (Signed)
Continue aspirin, lipitor and carvedilol.  No chest paion.  ?

## 2021-07-16 DIAGNOSIS — R0602 Shortness of breath: Secondary | ICD-10-CM | POA: Diagnosis not present

## 2021-07-18 DIAGNOSIS — G5133 Clonic hemifacial spasm, bilateral: Secondary | ICD-10-CM | POA: Diagnosis not present

## 2021-07-18 DIAGNOSIS — R1311 Dysphagia, oral phase: Secondary | ICD-10-CM | POA: Diagnosis not present

## 2021-07-18 DIAGNOSIS — R471 Dysarthria and anarthria: Secondary | ICD-10-CM | POA: Diagnosis not present

## 2021-07-18 DIAGNOSIS — G510 Bell's palsy: Secondary | ICD-10-CM | POA: Diagnosis not present

## 2021-07-20 ENCOUNTER — Other Ambulatory Visit: Payer: Self-pay | Admitting: Internal Medicine

## 2021-07-25 DIAGNOSIS — R0989 Other specified symptoms and signs involving the circulatory and respiratory systems: Secondary | ICD-10-CM | POA: Diagnosis not present

## 2021-07-25 DIAGNOSIS — I10 Essential (primary) hypertension: Secondary | ICD-10-CM | POA: Diagnosis not present

## 2021-07-25 DIAGNOSIS — E118 Type 2 diabetes mellitus with unspecified complications: Secondary | ICD-10-CM | POA: Diagnosis not present

## 2021-07-25 DIAGNOSIS — Z7985 Long-term (current) use of injectable non-insulin antidiabetic drugs: Secondary | ICD-10-CM

## 2021-07-25 DIAGNOSIS — Z9981 Dependence on supplemental oxygen: Secondary | ICD-10-CM

## 2021-07-25 DIAGNOSIS — J182 Hypostatic pneumonia, unspecified organism: Secondary | ICD-10-CM | POA: Diagnosis not present

## 2021-07-25 DIAGNOSIS — Z8719 Personal history of other diseases of the digestive system: Secondary | ICD-10-CM

## 2021-07-25 DIAGNOSIS — Z9181 History of falling: Secondary | ICD-10-CM

## 2021-07-25 DIAGNOSIS — Z7984 Long term (current) use of oral hypoglycemic drugs: Secondary | ICD-10-CM

## 2021-07-25 DIAGNOSIS — Z7982 Long term (current) use of aspirin: Secondary | ICD-10-CM

## 2021-07-25 DIAGNOSIS — G4733 Obstructive sleep apnea (adult) (pediatric): Secondary | ICD-10-CM | POA: Diagnosis not present

## 2021-07-25 DIAGNOSIS — Z87891 Personal history of nicotine dependence: Secondary | ICD-10-CM

## 2021-07-25 DIAGNOSIS — K5790 Diverticulosis of intestine, part unspecified, without perforation or abscess without bleeding: Secondary | ICD-10-CM

## 2021-07-25 DIAGNOSIS — M47816 Spondylosis without myelopathy or radiculopathy, lumbar region: Secondary | ICD-10-CM

## 2021-07-25 DIAGNOSIS — E7800 Pure hypercholesterolemia, unspecified: Secondary | ICD-10-CM

## 2021-07-25 DIAGNOSIS — E119 Type 2 diabetes mellitus without complications: Secondary | ICD-10-CM | POA: Diagnosis not present

## 2021-07-25 DIAGNOSIS — Z981 Arthrodesis status: Secondary | ICD-10-CM

## 2021-07-25 DIAGNOSIS — M47812 Spondylosis without myelopathy or radiculopathy, cervical region: Secondary | ICD-10-CM

## 2021-07-25 DIAGNOSIS — Z792 Long term (current) use of antibiotics: Secondary | ICD-10-CM

## 2021-07-25 DIAGNOSIS — Z8673 Personal history of transient ischemic attack (TIA), and cerebral infarction without residual deficits: Secondary | ICD-10-CM

## 2021-07-25 DIAGNOSIS — I447 Left bundle-branch block, unspecified: Secondary | ICD-10-CM | POA: Diagnosis not present

## 2021-07-25 DIAGNOSIS — J9601 Acute respiratory failure with hypoxia: Secondary | ICD-10-CM | POA: Diagnosis not present

## 2021-07-25 DIAGNOSIS — F32A Depression, unspecified: Secondary | ICD-10-CM | POA: Diagnosis not present

## 2021-07-25 DIAGNOSIS — Z7952 Long term (current) use of systemic steroids: Secondary | ICD-10-CM

## 2021-07-25 DIAGNOSIS — K219 Gastro-esophageal reflux disease without esophagitis: Secondary | ICD-10-CM

## 2021-07-25 DIAGNOSIS — I2510 Atherosclerotic heart disease of native coronary artery without angina pectoris: Secondary | ICD-10-CM | POA: Diagnosis not present

## 2021-07-25 DIAGNOSIS — E049 Nontoxic goiter, unspecified: Secondary | ICD-10-CM

## 2021-07-25 DIAGNOSIS — I480 Paroxysmal atrial fibrillation: Secondary | ICD-10-CM | POA: Diagnosis not present

## 2021-07-29 ENCOUNTER — Other Ambulatory Visit (INDEPENDENT_AMBULATORY_CARE_PROVIDER_SITE_OTHER): Payer: Medicare PPO

## 2021-07-29 ENCOUNTER — Other Ambulatory Visit: Payer: Self-pay

## 2021-07-29 DIAGNOSIS — E7800 Pure hypercholesterolemia, unspecified: Secondary | ICD-10-CM | POA: Diagnosis not present

## 2021-07-29 DIAGNOSIS — I10 Essential (primary) hypertension: Secondary | ICD-10-CM

## 2021-07-29 DIAGNOSIS — D509 Iron deficiency anemia, unspecified: Secondary | ICD-10-CM

## 2021-07-29 DIAGNOSIS — E1159 Type 2 diabetes mellitus with other circulatory complications: Secondary | ICD-10-CM

## 2021-07-29 LAB — CBC WITH DIFFERENTIAL/PLATELET
Basophils Absolute: 0.1 10*3/uL (ref 0.0–0.1)
Basophils Relative: 1.2 % (ref 0.0–3.0)
Eosinophils Absolute: 0.2 10*3/uL (ref 0.0–0.7)
Eosinophils Relative: 2.8 % (ref 0.0–5.0)
HCT: 36.8 % — ABNORMAL LOW (ref 39.0–52.0)
Hemoglobin: 12.1 g/dL — ABNORMAL LOW (ref 13.0–17.0)
Lymphocytes Relative: 20.4 % (ref 12.0–46.0)
Lymphs Abs: 1.3 10*3/uL (ref 0.7–4.0)
MCHC: 33 g/dL (ref 30.0–36.0)
MCV: 88.5 fl (ref 78.0–100.0)
Monocytes Absolute: 0.3 10*3/uL (ref 0.1–1.0)
Monocytes Relative: 5.2 % (ref 3.0–12.0)
Neutro Abs: 4.5 10*3/uL (ref 1.4–7.7)
Neutrophils Relative %: 70.4 % (ref 43.0–77.0)
Platelets: 198 10*3/uL (ref 150.0–400.0)
RBC: 4.16 Mil/uL — ABNORMAL LOW (ref 4.22–5.81)
RDW: 14.7 % (ref 11.5–15.5)
WBC: 6.4 10*3/uL (ref 4.0–10.5)

## 2021-07-29 LAB — VITAMIN B12: Vitamin B-12: 317 pg/mL (ref 211–911)

## 2021-07-29 LAB — HEPATIC FUNCTION PANEL
ALT: 21 U/L (ref 0–53)
AST: 16 U/L (ref 0–37)
Albumin: 4 g/dL (ref 3.5–5.2)
Alkaline Phosphatase: 55 U/L (ref 39–117)
Bilirubin, Direct: 0.1 mg/dL (ref 0.0–0.3)
Total Bilirubin: 0.5 mg/dL (ref 0.2–1.2)
Total Protein: 6.3 g/dL (ref 6.0–8.3)

## 2021-07-29 LAB — LIPID PANEL
Cholesterol: 120 mg/dL (ref 0–200)
HDL: 35.1 mg/dL — ABNORMAL LOW (ref >39.00)
NonHDL: 85.07
Total CHOL/HDL Ratio: 3
Triglycerides: 274 mg/dL — ABNORMAL HIGH (ref 0.0–149.0)
VLDL: 54.8 mg/dL — ABNORMAL HIGH (ref 0.0–40.0)

## 2021-07-29 LAB — IBC + FERRITIN
Ferritin: 75.7 ng/mL (ref 22.0–322.0)
Iron: 61 ug/dL (ref 42–165)
Saturation Ratios: 17.6 % — ABNORMAL LOW (ref 20.0–50.0)
TIBC: 347.2 ug/dL (ref 250.0–450.0)
Transferrin: 248 mg/dL (ref 212.0–360.0)

## 2021-07-29 LAB — BASIC METABOLIC PANEL
BUN: 27 mg/dL — ABNORMAL HIGH (ref 6–23)
CO2: 23 mEq/L (ref 19–32)
Calcium: 9.2 mg/dL (ref 8.4–10.5)
Chloride: 108 mEq/L (ref 96–112)
Creatinine, Ser: 1.04 mg/dL (ref 0.40–1.50)
GFR: 69.43 mL/min (ref >60.00)
Glucose, Bld: 200 mg/dL — ABNORMAL HIGH (ref 70–99)
Potassium: 4.7 mEq/L (ref 3.5–5.1)
Sodium: 141 mEq/L (ref 135–145)

## 2021-07-29 LAB — HEMOGLOBIN A1C: Hgb A1c MFr Bld: 7.3 % — ABNORMAL HIGH (ref 4.6–6.5)

## 2021-07-29 LAB — LDL CHOLESTEROL, DIRECT: Direct LDL: 65 mg/dL

## 2021-08-02 ENCOUNTER — Ambulatory Visit: Payer: Medicare PPO | Admitting: Internal Medicine

## 2021-08-02 ENCOUNTER — Other Ambulatory Visit: Payer: Self-pay

## 2021-08-02 DIAGNOSIS — D509 Iron deficiency anemia, unspecified: Secondary | ICD-10-CM

## 2021-08-02 DIAGNOSIS — I10 Essential (primary) hypertension: Secondary | ICD-10-CM | POA: Diagnosis not present

## 2021-08-02 DIAGNOSIS — I700 Atherosclerosis of aorta: Secondary | ICD-10-CM

## 2021-08-02 DIAGNOSIS — R5383 Other fatigue: Secondary | ICD-10-CM

## 2021-08-02 DIAGNOSIS — E049 Nontoxic goiter, unspecified: Secondary | ICD-10-CM | POA: Diagnosis not present

## 2021-08-02 DIAGNOSIS — I2510 Atherosclerotic heart disease of native coronary artery without angina pectoris: Secondary | ICD-10-CM | POA: Diagnosis not present

## 2021-08-02 DIAGNOSIS — E7800 Pure hypercholesterolemia, unspecified: Secondary | ICD-10-CM

## 2021-08-02 DIAGNOSIS — K219 Gastro-esophageal reflux disease without esophagitis: Secondary | ICD-10-CM

## 2021-08-02 DIAGNOSIS — E1159 Type 2 diabetes mellitus with other circulatory complications: Secondary | ICD-10-CM

## 2021-08-02 DIAGNOSIS — I480 Paroxysmal atrial fibrillation: Secondary | ICD-10-CM

## 2021-08-02 DIAGNOSIS — F419 Anxiety disorder, unspecified: Secondary | ICD-10-CM

## 2021-08-02 DIAGNOSIS — G4733 Obstructive sleep apnea (adult) (pediatric): Secondary | ICD-10-CM

## 2021-08-02 NOTE — Progress Notes (Signed)
Patient ID: Ricardo Nash, male   DOB: Apr 08, 1946, 76 y.o.   MRN: [id number] ? ? ?Subjective:  ? ? Patient ID: Ricardo Nash, male    DOB: [birth year], 76 y.o.   MRN: [id number] ? ?This visit occurred during the SARS-CoV-2 public health emergency.  Safety protocols were in place, including screening questions prior to the visit, additional usage of staff PPE, and extensive cleaning of exam room while observing appropriate contact time as indicated for disinfecting solutions.  ? ?Patient here for a scheduled follow up.  ? ?Chief Complaint  ?Patient presents with  ? Hypertension  ? Hyperlipidemia  ? .  ? ?HPI ?Recently hospitalized with bilateral pneumonia and acute hypoxic respiratory failure.  ECHO - when ill - global systolic dysfunction and moderately reduced EF. Treated with broad spectrum abx and gentle diuresis with noted improvement.  Since discharged, he has continued to gradually improve.  Working with PT.  Doing exercises on his own as well.  Feeling better.  No increased cough or congestion.  Discussed recent labs.  A1c increased 7.3.  discussed medication.  Reviewed sugars.  AM sugars this week (now off prednisone and feeling some better) - 130s.  PM sugars after eating remaining less than 180.  No nausea or vomiting.  No chest pain.   ? ?Past Medical History:  ?Diagnosis Date  ? C. difficile colitis   ? Coronary artery disease   ? Depression   ? Diabetes mellitus ([name])   ? Diverticulitis   ? Diverticulosis   ? Dysrhythmia   ? PAF  ? Environmental allergies   ? GERD (gastroesophageal reflux disease)   ? Goiter   ? intrathoracic, s/p benign biopsy (Dr Harlow Asa)  ? Hypercholesterolemia   ? Hypertension   ? Osteoarthritis   ? cervical spine, lumbar spine  ? Paroxysmal atrial fibrillation ([facility])   ? Pneumonia   ? PONV (postoperative nausea and vomiting)   ? Sleep apnea   ? CPAP @ NIGHT  ? Stroke [facility])   ? TIA (transient ischemic attack)   ? ?Past Surgical History:  ?Procedure Laterality Date  ? BACK SURGERY   8/08  ? s/p fusion of L4-L5  ? capsule endoscopy    ? CATARACT EXTRACTION  2011  ? Dr. Herbert Deaner  ? [place] SURGERY  2002  ? COLONOSCOPY WITH PROPOFOL N/A 07/20/2017  ? Procedure: COLONOSCOPY WITH PROPOFOL;  Surgeon: Manya Silvas, MD;  Location: [facility] ENDOSCOPY;  Service: Endoscopy;  Laterality: N/A;  ? EYE SURGERY    ? cataract bilateral 10/1999  ? eyelid reduction    ? Dr. Carlis Abbott  ? INGUINAL HERNIA REPAIR  1991  ? Dr, Tamala Julian  ? LUMBAR LAMINECTOMY/DECOMPRESSION MICRODISCECTOMY N/A 10/15/2020  ? Procedure: Laminectomy - Lumbar Two-Lumbar Three - Lumbar Three-Lumbar Four with sublaminar decompression;  Surgeon: Eustace Moore, MD;  Location: [place];  Service: Neurosurgery;  Laterality: N/A;  Laminectomy - Lumbar Two-Lumbar Three - Lumbar Three-Lumbar Four with sublaminar decompression  ? NOSE SURGERY    ? turbinate reduction  ? SEPTOPLASTY  1975  ? SHOULDER SURGERY  2000  ? rotator cuff  ? ?Family History  ?Problem Relation Age of Onset  ? Congestive Heart Failure Father   ? Heart disease Father   ?     myocardial infarction  ? Rheumatic fever Father   ?     valvular disease  ? Heart disease Mother   ?     s/p CABG (age 13)  ? Kidney disease Sister   ?  Colon cancer Neg Hx   ? Prostate cancer Neg Hx   ? ?Social History  ? ?Socioeconomic History  ? Marital status: Married  ?  Spouse name: Not on file  ? Number of children: 2  ? Years of education: Not on file  ? Highest education level: Not on file  ?Occupational History  ? Occupation: retired Pharmacist, hospital  ?Tobacco Use  ? Smoking status: Former  ? Smokeless tobacco: Never  ?Vaping Use  ? Vaping Use: Never used  ?Substance and Sexual Activity  ? Alcohol use: Yes  ?  Alcohol/week: 0.0 standard drinks  ?  Comment: occasional  ? Drug use: No  ? Sexual activity: Not Currently  ?Other Topics Concern  ? Not on file  ?Social History Narrative  ? Not on file  ? ?Social Determinants of Health  ? ?Financial Resource Strain: Low Risk   ? Difficulty of Paying Living Expenses: Not hard  at all  ?Food Insecurity: No Food Insecurity  ? Worried About Charity fundraiser in the Last Year: Never true  ? Ran Out of Food in the Last Year: Never true  ?Transportation Needs: No Transportation Needs  ? Lack of Transportation (Medical): No  ? Lack of Transportation (Non-Medical): No  ?Physical Activity: Sufficiently Active  ? Days of Exercise per Week: 5 days  ? Minutes of Exercise per Session: 60 min  ?Stress: No Stress Concern Present  ? Feeling of Stress : Not at all  ?Social Connections: Unknown  ? Frequency of Communication with Friends and Family: Not on file  ? Frequency of Social Gatherings with Friends and Family: Not on file  ? Attends Religious Services: Not on file  ? Active Member of Clubs or Organizations: Not on file  ? Attends Archivist Meetings: Not on file  ? Marital Status: Married  ? ? ? ?Review of Systems  ?Constitutional:  Positive for fatigue. Negative for appetite change and unexpected weight change.  ?HENT:  Negative for congestion and sinus pressure.   ?Respiratory:  Negative for cough and chest tightness.   ?     Breathing improving.   ?Cardiovascular:  Negative for chest pain, palpitations and leg swelling.  ?Gastrointestinal:  Negative for abdominal pain, diarrhea, nausea and vomiting.  ?Genitourinary:  Negative for difficulty urinating and dysuria.  ?Musculoskeletal:  Negative for joint swelling and myalgias.  ?Skin:  Negative for color change and rash.  ?Neurological:  Negative for dizziness, light-headedness and headaches.  ?Psychiatric/Behavioral:  Negative for agitation and dysphoric mood.   ? ?   ?Objective:  ?  ? ?BP 130/70   Pulse 71   Temp 97.8 ?F (36.6 ?C)   Resp 16   Ht '5\' 9"'  (1.753 m)   Wt 193 lb 9.6 oz (87.8 kg)   SpO2 97%   BMI 28.59 kg/m?  ?Wt Readings from Last 3 Encounters:  ?08/02/21 193 lb 9.6 oz (87.8 kg)  ?07/11/21 203 lb (92.1 kg)  ?07/03/21 203 lb 4.2 oz (92.2 kg)  ? ? ?Physical Exam ?Constitutional:   ?   General: He is not in acute  distress. ?   Appearance: Normal appearance. He is well-developed.  ?HENT:  ?   Head: Normocephalic and atraumatic.  ?   Right Ear: External ear normal.  ?   Left Ear: External ear normal.  ?Eyes:  ?   General: No scleral icterus.    ?   Right eye: No discharge.     ?   Left eye: No  discharge.  ?Cardiovascular:  ?   Rate and Rhythm: Normal rate and regular rhythm.  ?Pulmonary:  ?   Effort: Pulmonary effort is normal. No respiratory distress.  ?   Breath sounds: Normal breath sounds.  ?Abdominal:  ?   General: Bowel sounds are normal.  ?   Palpations: Abdomen is soft.  ?   Tenderness: There is no abdominal tenderness.  ?Musculoskeletal:     ?   General: No swelling or tenderness.  ?   Cervical back: Neck supple. No tenderness.  ?Lymphadenopathy:  ?   Cervical: No cervical adenopathy.  ?Skin: ?   Findings: No erythema or rash.  ?Neurological:  ?   Mental Status: He is alert.  ?Psychiatric:     ?   Mood and Affect: Mood normal.     ?   Behavior: Behavior normal.  ? ? ? ?Outpatient Encounter Medications as of 08/02/2021  ?Medication Sig  ? ACCU-CHEK GUIDE test strip USE AS DIRECTED TO TEST BLOOD GLUCOSE LEVELS TWICE DAILY  ? ALPRAZolam (XANAX) 0.25 MG tablet TAKE 1 TABLET BY MOUTH ONCE DAILY AS NEEDED.  ? amLODipine (NORVASC) 10 MG tablet TAKE 1 TABLET BY MOUTH ONCE DAILY  ? Ascorbic Acid (VITAMIN C) 100 MG tablet Take 100 mg by mouth in the morning.  ? aspirin EC 81 MG tablet Take 81 mg by mouth every evening. Swallow whole.  ? atorvastatin (LIPITOR) 20 MG tablet Take 1 tablet (20 mg total) by mouth daily.  ? Azelastine-Fluticasone (DYMISTA) 137-50 MCG/ACT SUSP Place 1 spray into the nose in the morning and at bedtime.  ? blood glucose meter kit and supplies Dispense based on patient and insurance preference. Use up to four times daily as directed. (FOR ICD-10 E10.9, E11.9).  ? carvedilol (COREG) 25 MG tablet Take 1 tablet (25 mg total) by mouth 2 (two) times daily with a meal.  ? Dulaglutide (TRULICITY) 3.60 OV/7.0HE  SOPN INJECT 0.75MG SUBQ ONCE A WEEK  ? guaiFENesin (MUCINEX) 600 MG 12 hr tablet Take 2 tablets (1,200 mg total) by mouth 2 (two) times daily. (Patient taking differently: Take 600 mg by mouth 2 (two) times daily

## 2021-08-03 ENCOUNTER — Encounter: Payer: Self-pay | Admitting: Internal Medicine

## 2021-08-03 NOTE — Assessment & Plan Note (Signed)
Apparently documented in hospital. Is on carvedilol and aspirin.   Appears to be in SR today.  Follow.  

## 2021-08-03 NOTE — Assessment & Plan Note (Signed)
Still with some fatigue.  Working with therapy.  Exercising.  Follow.  ?

## 2021-08-03 NOTE — Assessment & Plan Note (Signed)
Continue lipitor.  Low cholesterol diet and exercise.  Follow lipid panel and liver function tests.   

## 2021-08-03 NOTE — Assessment & Plan Note (Signed)
Recent CT -revealed multinodular goiter with intrathoracic extension, increased in size in comparison to prior chest CT in March 2012.  Had been evaluated by Dr. Harlow Asa previously.  Biopsy negative.  Once above acute issues sorted through we will have him follow-up regarding his thyroid. ?

## 2021-08-03 NOTE — Assessment & Plan Note (Signed)
On zoloft.  Follow.  Doing better.   

## 2021-08-03 NOTE — Assessment & Plan Note (Signed)
Continue carvedilol and amlodipine.  Blood pressures as outlined.  Outside checks doing well.  Follow pressures.  Follow metabolic panel.  ?

## 2021-08-03 NOTE — Assessment & Plan Note (Signed)
Continue with CPAP

## 2021-08-03 NOTE — Assessment & Plan Note (Signed)
Continues on lipitor.  

## 2021-08-03 NOTE — Assessment & Plan Note (Signed)
No upper symptoms reported.  Continue protonix.  

## 2021-08-03 NOTE — Assessment & Plan Note (Signed)
Sugars as outlined.  Recent a1c 7.3.  Discussed.  Will hold on making changes.  He is feeling better.  Getting back to some exercise.  Diet.  Off prednisone.  Follow.  If persistent elevation, will need to adjust medication.  Low carb diet.  Follow met b and a1c.  ?

## 2021-08-03 NOTE — Assessment & Plan Note (Signed)
Continue aspirin, lipitor and carvedilol.  No chest paion.  ?

## 2021-08-03 NOTE — Assessment & Plan Note (Signed)
Follow cbc.  

## 2021-08-05 ENCOUNTER — Other Ambulatory Visit: Payer: Self-pay | Admitting: Internal Medicine

## 2021-08-09 ENCOUNTER — Other Ambulatory Visit: Payer: Self-pay

## 2021-08-09 ENCOUNTER — Telehealth: Payer: Self-pay | Admitting: Internal Medicine

## 2021-08-09 MED ORDER — TRIAMTERENE-HCTZ 37.5-25 MG PO TABS: 1.0000 | ORAL_TABLET | Freq: Every day | ORAL | 1 refills | Status: DC

## 2021-08-09 NOTE — Telephone Encounter (Signed)
Pt need refill on triamterene-hydrochlorothiazide sent to [facility] in [place]. Pt is completely out  ?

## 2021-08-09 NOTE — Telephone Encounter (Signed)
Patient is aware of below. 

## 2021-08-09 NOTE — Telephone Encounter (Signed)
Pt is taking two a day by mouth once daily. Pt want to make it a 90 day supply  ?

## 2021-08-09 NOTE — Telephone Encounter (Signed)
LMTCB. NEED TO CONFIRM HOW PATIENT IS TAKING ?

## 2021-08-09 NOTE — Telephone Encounter (Signed)
BP running 120s/60s. Per Dr Nicki Reaper changed dose to 1 tablet per day and sent in 90 day supply per patient request. ?

## 2021-08-13 DIAGNOSIS — G510 Bell's palsy: Secondary | ICD-10-CM | POA: Diagnosis not present

## 2021-08-13 DIAGNOSIS — R471 Dysarthria and anarthria: Secondary | ICD-10-CM | POA: Diagnosis not present

## 2021-08-13 DIAGNOSIS — R1311 Dysphagia, oral phase: Secondary | ICD-10-CM | POA: Diagnosis not present

## 2021-08-19 ENCOUNTER — Other Ambulatory Visit: Payer: Medicare PPO

## 2021-08-19 DIAGNOSIS — R32 Unspecified urinary incontinence: Secondary | ICD-10-CM | POA: Diagnosis not present

## 2021-08-19 DIAGNOSIS — E291 Testicular hypofunction: Secondary | ICD-10-CM | POA: Diagnosis not present

## 2021-08-20 LAB — PSA: Prostate Specific Ag, Serum: 1 ng/mL (ref 0.0–4.0)

## 2021-08-20 LAB — HEMOGLOBIN AND HEMATOCRIT, BLOOD
Hematocrit: 39.6 % (ref 37.5–51.0)
Hemoglobin: 13.2 g/dL (ref 13.0–17.7)

## 2021-08-20 LAB — TESTOSTERONE: Testosterone: 134 ng/dL — ABNORMAL LOW (ref 264–916)

## 2021-08-21 ENCOUNTER — Encounter: Payer: Self-pay | Admitting: Urology

## 2021-08-21 ENCOUNTER — Ambulatory Visit (INDEPENDENT_AMBULATORY_CARE_PROVIDER_SITE_OTHER): Payer: Medicare PPO | Admitting: Urology

## 2021-08-21 VITALS — BP 150/65 | HR 79 | Ht 69.0 in | Wt 193.0 lb

## 2021-08-21 DIAGNOSIS — N529 Male erectile dysfunction, unspecified: Secondary | ICD-10-CM

## 2021-08-21 DIAGNOSIS — N138 Other obstructive and reflux uropathy: Secondary | ICD-10-CM | POA: Diagnosis not present

## 2021-08-21 DIAGNOSIS — E291 Testicular hypofunction: Secondary | ICD-10-CM | POA: Diagnosis not present

## 2021-08-21 DIAGNOSIS — N401 Enlarged prostate with lower urinary tract symptoms: Secondary | ICD-10-CM

## 2021-08-21 MED ORDER — TESTOSTERONE CYPIONATE 200 MG/ML IM SOLN: 200.0000 mg | INTRAMUSCULAR | 2 refills | Status: DC

## 2021-08-21 MED ORDER — TAMSULOSIN HCL 0.4 MG PO CAPS: 0.8000 mg | ORAL_CAPSULE | Freq: Every day | ORAL | 6 refills | Status: DC

## 2021-08-21 NOTE — Progress Notes (Signed)
? ?  08/21/2021 ?9:38 AM  ? ?Ricardo Nash ?07-26-1945 ?[id number] ? ?Reason for visit: Follow up low testosterone, ED, urinary symptoms/BPH ? ?HPI: ?Comorbid 76 year old male previously followed at [facility] urology for the above issues.  Comorbidities include sleep apnea, diabetes, hypertension, depression.  He has been on testosterone injections every 2 weeks long-term and PSA has remained low, less than 1.  He reports significant improvement on the testosterone and his mood, energy, and overall quality of life.  He has failed PDE 5 inhibitors and injections with Trimix for ED, and he is no longer interested in pursuing treatment option for ED.  Urinary symptoms have worsened slightly with taking the Flomax once daily in the morning, and he is interested in changing the dosing.  His primary urinary complaint is urinary frequency and weak stream in the morning.  He does not have any nocturia. ? ?He was recently hospitalized for a week with bilateral pneumonia, and still feels weak and that he is recovering. ? ?He continues to use his testosterone injections.  I reviewed his recent labs from 08/19/2021 showing a low testosterone of 134, normal PSA of 1.0, and normal hematocrit of 39.6.  He reports that before he started testosterone his values were usually below 50.  He still feels the testosterone is working well.  We discussed the testosterone may be on the lower side after his recent long hospitalization for pneumonia. ? ?-I recommended increasing the Flomax to 0.8 mg nightly, and we discussed considering cystoscopy/TRUS in the future if he has worsening urinary symptoms ?-Testosterone refilled ?-RTC 6 months PSA, testosterone, H/H, and PVR ? ? ?Billey Co, MD ? ?[facility] ?[address] ?[place], [place] [address] ?[phone] ? ? ?

## 2021-08-22 ENCOUNTER — Ambulatory Visit: Payer: Medicare PPO | Admitting: Urology

## 2021-09-09 ENCOUNTER — Other Ambulatory Visit: Payer: Self-pay | Admitting: Internal Medicine

## 2021-09-19 ENCOUNTER — Other Ambulatory Visit: Payer: Self-pay | Admitting: Internal Medicine

## 2021-09-30 ENCOUNTER — Telehealth: Payer: Self-pay | Admitting: Internal Medicine

## 2021-09-30 ENCOUNTER — Other Ambulatory Visit: Payer: Self-pay

## 2021-09-30 ENCOUNTER — Telehealth: Payer: Self-pay

## 2021-09-30 DIAGNOSIS — G4733 Obstructive sleep apnea (adult) (pediatric): Secondary | ICD-10-CM

## 2021-09-30 NOTE — Telephone Encounter (Signed)
Ok

## 2021-09-30 NOTE — Telephone Encounter (Signed)
Patient called and said his CPAP  machine broke over the weekend. Patient called his insurance, [employer] needs a prescription form Dr Nicki Reaper.

## 2021-10-04 ENCOUNTER — Other Ambulatory Visit (INDEPENDENT_AMBULATORY_CARE_PROVIDER_SITE_OTHER): Payer: Medicare PPO

## 2021-10-04 DIAGNOSIS — E1159 Type 2 diabetes mellitus with other circulatory complications: Secondary | ICD-10-CM | POA: Diagnosis not present

## 2021-10-04 DIAGNOSIS — E7800 Pure hypercholesterolemia, unspecified: Secondary | ICD-10-CM | POA: Diagnosis not present

## 2021-10-04 LAB — CBC WITH DIFFERENTIAL/PLATELET
Basophils Absolute: 0.1 10*3/uL (ref 0.0–0.1)
Basophils Relative: 0.8 % (ref 0.0–3.0)
Eosinophils Absolute: 0.1 10*3/uL (ref 0.0–0.7)
Eosinophils Relative: 1.3 % (ref 0.0–5.0)
HCT: 41.5 % (ref 39.0–52.0)
Hemoglobin: 13.6 g/dL (ref 13.0–17.0)
Lymphocytes Relative: 20.3 % (ref 12.0–46.0)
Lymphs Abs: 1.6 10*3/uL (ref 0.7–4.0)
MCHC: 32.8 g/dL (ref 30.0–36.0)
MCV: 88.9 fl (ref 78.0–100.0)
Monocytes Absolute: 0.4 10*3/uL (ref 0.1–1.0)
Monocytes Relative: 5.5 % (ref 3.0–12.0)
Neutro Abs: 5.7 10*3/uL (ref 1.4–7.7)
Neutrophils Relative %: 72.1 % (ref 43.0–77.0)
Platelets: 250 10*3/uL (ref 150.0–400.0)
RBC: 4.67 Mil/uL (ref 4.22–5.81)
RDW: 15.1 % (ref 11.5–15.5)
WBC: 7.9 10*3/uL (ref 4.0–10.5)

## 2021-10-04 LAB — BASIC METABOLIC PANEL
BUN: 20 mg/dL (ref 6–23)
CO2: 29 mEq/L (ref 19–32)
Calcium: 10.4 mg/dL (ref 8.4–10.5)
Chloride: 103 mEq/L (ref 96–112)
Creatinine, Ser: 1.15 mg/dL (ref 0.40–1.50)
GFR: 61.46 mL/min (ref >60.00)
Glucose, Bld: 144 mg/dL — ABNORMAL HIGH (ref 70–99)
Potassium: 5.5 mEq/L — ABNORMAL HIGH (ref 3.5–5.1)
Sodium: 140 mEq/L (ref 135–145)

## 2021-10-04 NOTE — Telephone Encounter (Signed)
S/w pt wife re: CPAP machine. Where are we sending rx ?   Mrs Bejar will find out and call me back.

## 2021-10-08 ENCOUNTER — Other Ambulatory Visit
Admission: RE | Admit: 2021-10-08 | Discharge: 2021-10-08 | Disposition: A | Discharge status: Home / Self Care | Payer: Medicare PPO | Source: Ambulatory Visit | Attending: Internal Medicine | Admitting: Internal Medicine

## 2021-10-08 ENCOUNTER — Encounter: Payer: Self-pay | Admitting: Internal Medicine

## 2021-10-08 ENCOUNTER — Ambulatory Visit: Payer: Medicare PPO | Admitting: Internal Medicine

## 2021-10-08 DIAGNOSIS — J189 Pneumonia, unspecified organism: Secondary | ICD-10-CM

## 2021-10-08 DIAGNOSIS — F419 Anxiety disorder, unspecified: Secondary | ICD-10-CM

## 2021-10-08 DIAGNOSIS — E1159 Type 2 diabetes mellitus with other circulatory complications: Secondary | ICD-10-CM | POA: Diagnosis not present

## 2021-10-08 DIAGNOSIS — M79671 Pain in right foot: Secondary | ICD-10-CM

## 2021-10-08 DIAGNOSIS — E875 Hyperkalemia: Secondary | ICD-10-CM | POA: Insufficient documentation

## 2021-10-08 DIAGNOSIS — D509 Iron deficiency anemia, unspecified: Secondary | ICD-10-CM

## 2021-10-08 DIAGNOSIS — I2510 Atherosclerotic heart disease of native coronary artery without angina pectoris: Secondary | ICD-10-CM | POA: Diagnosis not present

## 2021-10-08 DIAGNOSIS — K219 Gastro-esophageal reflux disease without esophagitis: Secondary | ICD-10-CM

## 2021-10-08 DIAGNOSIS — G4733 Obstructive sleep apnea (adult) (pediatric): Secondary | ICD-10-CM | POA: Diagnosis not present

## 2021-10-08 DIAGNOSIS — E049 Nontoxic goiter, unspecified: Secondary | ICD-10-CM

## 2021-10-08 DIAGNOSIS — I700 Atherosclerosis of aorta: Secondary | ICD-10-CM

## 2021-10-08 DIAGNOSIS — E7800 Pure hypercholesterolemia, unspecified: Secondary | ICD-10-CM

## 2021-10-08 DIAGNOSIS — I480 Paroxysmal atrial fibrillation: Secondary | ICD-10-CM | POA: Diagnosis not present

## 2021-10-08 DIAGNOSIS — I10 Essential (primary) hypertension: Secondary | ICD-10-CM | POA: Diagnosis not present

## 2021-10-08 LAB — POTASSIUM: Potassium: 4.5 mmol/L (ref 3.5–5.1)

## 2021-10-08 NOTE — Progress Notes (Addendum)
Patient ID: Ricardo Nash, male   DOB: [birth year], 76 y.o.   MRN: [id number]   Subjective:    Patient ID: Ricardo Nash, male    DOB: June 10, 1945, 76 y.o.   MRN: [id number]   Patient here for a scheduled follow up.   Chief Complaint  Patient presents with   Follow-up    50mofollow up   Hypertension   Diabetes   .   HPI Reports he is doing relatively well.  Feeling better.  Energy is improving.  He is back exercising.  Going to the gym.  Feels is getting stronger. Breathing better.  No chest pain or sob reported.  No acid reflux.  No abdominal pain or bowel change.  No increased cough.  States cpap broke 2 weeks ago.  In the process of getting another one.  [place]  Uses cpap regularly.  Has been using a portable one while trying to get a new machine.  Does report some right foot/great toe issues.  Request referral to podiatry.    Past Medical History:  Diagnosis Date   C. difficile colitis    Coronary artery disease    Depression    Diabetes mellitus ([place]    Diverticulitis    Diverticulosis    Dysrhythmia    PAF   Environmental allergies    GERD (gastroesophageal reflux disease)    Goiter    intrathoracic, s/p benign biopsy (Dr GHarlow Asa   Hypercholesterolemia    Hypertension    Osteoarthritis    cervical spine, lumbar spine   Paroxysmal atrial fibrillation (HCC)    Pneumonia    PONV (postoperative nausea and vomiting)    Sleep apnea    CPAP @ NIGHT   Stroke ([facility]    TIA (transient ischemic attack)    Past Surgical History:  Procedure Laterality Date   BACK SURGERY  8/08   s/p fusion of L4-L5   capsule endoscopy     CATARACT EXTRACTION  2011   Dr. HHerbert Deaner  CERVICAL DISC SURGERY  2002   COLONOSCOPY WITH PROPOFOL N/A 07/20/2017   Procedure: COLONOSCOPY WITH PROPOFOL;  Surgeon: EManya Silvas MD;  [facility]  Service: Endoscopy;  Laterality: N/A;   EYE SURGERY     cataract bilateral 10/1999   eyelid  reduction     Dr. CKerby LessHERNIA REPAIR  1991   Dr, SCassell ClementLAMINECTOMY/DECOMPRESSION MICRODISCECTOMY N/A 10/15/2020   Procedure: Laminectomy - Lumbar Two-Lumbar Three - Lumbar Three-Lumbar Four with sublaminar decompression;  Surgeon: JEustace Moore MD;  [place]  Service: Neurosurgery;  Laterality: N/A;  Laminectomy - Lumbar Two-Lumbar Three - Lumbar Three-Lumbar Four with sublaminar decompression   NOSE SURGERY     turbinate reduction   SEPTOPLASTY  1975   SHOULDER SURGERY  2000   rotator cuff   Family History  Problem Relation Age of Onset   Congestive Heart Failure Father    Heart disease Father        myocardial infarction   Rheumatic fever Father        valvular disease   Heart disease Mother        s/p CABG (age 77   Kidney disease Sister    Colon cancer Neg Hx    Prostate cancer Neg Hx    Social History   Socioeconomic History   Marital status: Married    Spouse name: Not on file   Number of children:  2   Years of education: Not on file   Highest education level: Not on file  Occupational History   Occupation: retired Pharmacist, hospital  Tobacco Use   Smoking status: Former   Smokeless tobacco: Never  Scientific laboratory technician Use: Never used  Substance and Sexual Activity   Alcohol use: Yes    Alcohol/week: 0.0 standard drinks of alcohol    Comment: occasional   Drug use: No   Sexual activity: Not Currently  Other Topics Concern   Not on file  Social History Narrative   Not on file   Social Determinants of Health   Financial Resource Strain: Low Risk  (06/21/2021)   Overall Financial Resource Strain (CARDIA)    Difficulty of Paying Living Expenses: Not hard at all  Food Insecurity: No Food Insecurity (06/21/2021)   Hunger Vital Sign    Worried About Running Out of Food in the Last Year: Never true    Ran Out of Food in the Last Year: Never true  Transportation Needs: No Transportation Needs (06/21/2021)   PRAPARE - Radiographer, therapeutic (Medical): No    Lack of Transportation (Non-Medical): No  Physical Activity: Sufficiently Active (06/21/2021)   Exercise Vital Sign    Days of Exercise per Week: 5 days    Minutes of Exercise per Session: 60 min  Stress: No Stress Concern Present (06/21/2021)   [place]    Feeling of Stress : Not at all  Social Connections: Unknown (06/21/2021)   Social Connection and Isolation Panel [NHANES]    Frequency of Communication with Friends and Family: Not on file    Frequency of Social Gatherings with Friends and Family: Not on file    Attends Religious Services: Not on file    Active Member of Clubs or Organizations: Not on file    Attends Archivist Meetings: Not on file    Marital Status: Married     Review of Systems  Constitutional:  Negative for appetite change and unexpected weight change.  HENT:  Negative for congestion and sinus pressure.   Respiratory:  Negative for cough, chest tightness and shortness of breath.   Cardiovascular:  Negative for chest pain, palpitations and leg swelling.  Gastrointestinal:  Negative for abdominal pain, diarrhea, nausea and vomiting.  Genitourinary:  Negative for difficulty urinating and dysuria.  Musculoskeletal:  Negative for joint swelling and myalgias.  Skin:  Negative for color change and rash.  Neurological:  Negative for dizziness, light-headedness and headaches.  Psychiatric/Behavioral:  Negative for agitation and dysphoric mood.        Objective:     BP 138/64 (BP Location: Left Arm, Patient Position: Sitting, Cuff Size: Small)   Pulse 72   Temp 97.8 F (36.6 C) (Temporal)   Resp 18   Ht '5\' 9"'  (1.753 m)   Wt 203 lb 3.2 oz (92.2 kg)   SpO2 96%   BMI 30.01 kg/m  Wt Readings from Last 3 Encounters:  10/08/21 203 lb 3.2 oz (92.2 kg)  08/21/21 193 lb (87.5 kg)  08/02/21 193 lb 9.6 oz (87.8 kg)    Physical Exam Constitutional:       General: He is not in acute distress.    Appearance: Normal appearance. He is well-developed.  HENT:     Head: Normocephalic and atraumatic.     Right Ear: External ear normal.     Left Ear: External ear normal.  Eyes:  General: No scleral icterus.       Right eye: No discharge.        Left eye: No discharge.  Cardiovascular:     Rate and Rhythm: Normal rate and regular rhythm.  Pulmonary:     Effort: Pulmonary effort is normal. No respiratory distress.     Breath sounds: Normal breath sounds.  Abdominal:     General: Bowel sounds are normal.     Palpations: Abdomen is soft.     Tenderness: There is no abdominal tenderness.  Musculoskeletal:        General: No swelling or tenderness.     Cervical back: Neck supple. No tenderness.  Lymphadenopathy:     Cervical: No cervical adenopathy.  Skin:    Findings: No erythema or rash.  Neurological:     Mental Status: He is alert.  Psychiatric:        Mood and Affect: Mood normal.        Behavior: Behavior normal.      Outpatient Encounter Medications as of 10/08/2021  Medication Sig   ACCU-CHEK GUIDE test strip USE AS DIRECTED TO TEST BLOOD GLUCOSE LEVELS TWICE DAILY   ALPRAZolam (XANAX) 0.25 MG tablet TAKE 1 TABLET BY MOUTH ONCE DAILY AS NEEDED.   amLODipine (NORVASC) 10 MG tablet TAKE 1 TABLET BY MOUTH ONCE DAILY   Ascorbic Acid (VITAMIN C) 100 MG tablet Take 100 mg by mouth in the morning.   aspirin EC 81 MG tablet Take 81 mg by mouth every evening. Swallow whole.   atorvastatin (LIPITOR) 20 MG tablet TAKE 1 TABLET BY MOUTH ONCE EVERY EVENING   Azelastine-Fluticasone (DYMISTA) 137-50 MCG/ACT SUSP Place 1 spray into the nose in the morning and at bedtime.   blood glucose meter kit and supplies Dispense based on patient and insurance preference. Use up to four times daily as directed. (FOR ICD-10 E10.9, E11.9).   carvedilol (COREG) 25 MG tablet TAKE 1 TABLET BY MOUTH TWICE DAILY WITH A MEAL   Dulaglutide (TRULICITY) 3.21  YY/4.8GN SOPN INJECT 0.75MG SUBQ ONCE A WEEK   levocetirizine (XYZAL) 5 MG tablet TAKE 1 TABLET BY MOUTH ONCE EVERY EVENING   lisinopril (ZESTRIL) 40 MG tablet TAKE 1 TABLET BY MOUTH ONCE DAILY   meloxicam (MOBIC) 15 MG tablet Take 15 mg by mouth daily.   metFORMIN (GLUCOPHAGE) 1000 MG tablet TAKE 1 TABLET BY MOUTH TWICE DAILY WITH MEALS   montelukast (SINGULAIR) 10 MG tablet TAKE 1 TABLET BY MOUTH AT BEDTIME   Multiple Vitamin (MULTIVITAMIN WITH MINERALS) TABS tablet Take 1 tablet by mouth in the morning.   pioglitazone (ACTOS) 15 MG tablet TAKE 1 TABLET BY MOUTH ONCE DAILY   sertraline (ZOLOFT) 100 MG tablet TAKE 1 TABLET BY MOUTH ONCE DAILY   tamsulosin (FLOMAX) 0.4 MG CAPS capsule Take 2 capsules (0.8 mg total) by mouth daily after supper.   testosterone cypionate (DEPOTESTOSTERONE CYPIONATE) 200 MG/ML injection Inject 1 mL (200 mg total) into the muscle every 14 (fourteen) days.   traZODone (DESYREL) 50 MG tablet Take 1.5 tablets (75 mg total) by mouth at bedtime.   [DISCONTINUED] pantoprazole (PROTONIX) 40 MG tablet TAKE 1 TABLET BY MOUTH ONCE DAILY   [DISCONTINUED] triamterene-hydrochlorothiazide (MAXZIDE-25) 37.5-25 MG tablet Take 1 tablet by mouth daily.   [DISCONTINUED] guaiFENesin (MUCINEX) 600 MG 12 hr tablet Take 2 tablets (1,200 mg total) by mouth 2 (two) times daily. (Patient not taking: Reported on 10/08/2021)   No facility-administered encounter medications on file as of 10/08/2021.  Lab Results  Component Value Date   WBC 7.9 10/04/2021   HGB 13.6 10/04/2021   HCT 41.5 10/04/2021   PLT 250.0 10/04/2021   GLUCOSE 144 (H) 10/04/2021   CHOL 120 07/29/2021   TRIG 274.0 (H) 07/29/2021   HDL 35.10 (L) 07/29/2021   LDLDIRECT 65.0 07/29/2021   LDLCALC 58 11/21/2013   ALT 21 07/29/2021   AST 16 07/29/2021   NA 140 10/04/2021   K 4.5 10/08/2021   CL 103 10/04/2021   CREATININE 1.15 10/04/2021   BUN 20 10/04/2021   CO2 29 10/04/2021   TSH 1.10 07/11/2021   PSA 0.82  06/10/2019   INR 0.9 10/11/2020   HGBA1C 7.3 (H) 07/29/2021   MICROALBUR 1.5 10/18/2019    CT Angio Chest Pulmonary Embolism (PE) W or WO Contrast  Result Date: 07/01/2021 CLINICAL DATA:  Pulmonary embolism (PE) suspected, unknown D-dimer EXAM: CT ANGIOGRAPHY CHEST WITH CONTRAST TECHNIQUE: Multidetector CT imaging of the chest was performed using the standard protocol during bolus administration of intravenous contrast. Multiplanar CT image reconstructions and MIPs were obtained to evaluate the vascular anatomy. RADIATION DOSE REDUCTION: This exam was performed according to the departmental dose-optimization program which includes automated exposure control, adjustment of the mA and/or kV according to patient size and/or use of iterative reconstruction technique. CONTRAST:  36m OMNIPAQUE IOHEXOL 350 MG/ML SOLN COMPARISON:  Radiographs 07/01/2021, 06/29/2021, 10/11/2020 FINDINGS: Cardiovascular: Respiratory motion artifact limits evaluation of the segmental and subsegmental vessels. There is no evidence of pulmonary embolism to the level of the lobar arteries. Mild cardiomegaly. No pericardial disease. Coronary artery calcifications. Mild atherosclerotic calcifications of the aortic arch and descending aorta. Mediastinum/Nodes: There are a few prominent mediastinal and hilar lymph nodes, likely reactive. Enlarged, multinodular goiter with mediastinal extension, increased in size in comparison due prior chest CT and March 2012. Lungs/Pleura: High there is multifocal consolidation and ground-glass opacities bilaterally, most confluent in the lower lobes and right middle lobe into a slightly lesser degree in the upper lobes. Small bilateral pleural effusions. No pneumothorax. Upper Abdomen: Airways are patent. Musculoskeletal: Chronic anterior compression fracture of T12. No acute osseous abnormality. Diffuse idiopathic skeletal hyperostosis. Review of the MIP images confirms the above findings. IMPRESSION:  Multifocal pneumonia.  Small bilateral pleural effusions. Respiratory motion artifact limits evaluation of the segmental and subsegmental pulmonary arteries. Within this limitation there is no evidence of pulmonary embolism to the level of the lobar arteries. Multinodular goiter with intrathoracic extension, increased in size in comparison to prior chest CT in March 2012. Aortic Atherosclerosis (ICD10-I70.0). Electronically Signed   By: JMaurine SimmeringM.D.   On: 07/01/2021 10:04   DG Chest Port 1 View  Result Date: 07/01/2021 CLINICAL DATA:  Dyspnea. EXAM: PORTABLE CHEST 1 VIEW COMPARISON:  06/29/2021 FINDINGS: Heart size is normal. Interval development of bilateral LOWER lobe infiltrates a RIGHT slightly more confluent than the LEFT. IMPRESSION: New bilateral LOWER lobe infiltrates. Electronically Signed   By: ENolon NationsM.D.   On: 07/01/2021 08:55   ECHOCARDIOGRAM COMPLETE  Result Date: 07/02/2021    ECHOCARDIOGRAM REPORT   Patient Name:   HJOVEN MOMDate of Exam: 07/01/2021 Medical Rec #:  [id number]         Height:       69.0 in Accession #:    [id number]        Weight:       203.0 lb Date of Birth:  107/28/1947         BSA:  2.079 m Patient Age:    61 years           BP:           155/60 mmHg Patient Gender: M                  HR:           82 bpm. Exam Location:  [facility] Procedure: 2D Echo, Color Doppler and Cardiac Doppler Indications:     R06.03 Acute respiratory distress  History:         Patient has no prior history of Echocardiogram examinations.                  CAD, Stroke; Risk Factors:Hypertension, Sleep Apnea, HCL and                  Diabetes.  Sonographer:     Charmayne Sheer Referring Phys:  [id number] Richarda Osmond Diagnosing Phys: Yolonda Kida MD IMPRESSIONS  1. Left ventricular ejection fraction, by estimation, is 40 to 45%. The left ventricle has mildly decreased function. The left ventricle demonstrates global hypokinesis. The left ventricular internal cavity size  was mildly to moderately dilated. Left ventricular diastolic parameters were normal.  2. Right ventricular systolic function is low normal. The right ventricular size is mildly enlarged. Mildly increased right ventricular wall thickness.  3. The mitral valve is grossly normal. Trivial mitral valve regurgitation.  4. The aortic valve is grossly normal. Aortic valve regurgitation is not visualized. FINDINGS  Left Ventricle: Left ventricular ejection fraction, by estimation, is 40 to 45%. The left ventricle has mildly decreased function. The left ventricle demonstrates global hypokinesis. The left ventricular internal cavity size was mildly to moderately dilated. There is no left ventricular hypertrophy. Abnormal (paradoxical) septal motion, consistent with left bundle branch block. Left ventricular diastolic parameters were normal. Right Ventricle: The right ventricular size is mildly enlarged. Mildly increased right ventricular wall thickness. Right ventricular systolic function is low normal. Left Atrium: Left atrial size was normal in size. Right Atrium: Right atrial size was normal in size. Pericardium: There is no evidence of pericardial effusion. Mitral Valve: The mitral valve is grossly normal. Trivial mitral valve regurgitation. MV peak gradient, 4.5 mmHg. The mean mitral valve gradient is 3.0 mmHg. Tricuspid Valve: The tricuspid valve is grossly normal. Tricuspid valve regurgitation is trivial. Aortic Valve: The aortic valve is grossly normal. Aortic valve regurgitation is not visualized. Aortic valve mean gradient measures 5.0 mmHg. Aortic valve peak gradient measures 9.4 mmHg. Aortic valve area, by VTI measures 1.95 cm. Pulmonic Valve: The pulmonic valve was normal in structure. Pulmonic valve regurgitation is not visualized. Aorta: The ascending aorta was not well visualized. IAS/Shunts: No atrial level shunt detected by color flow Doppler.  LEFT VENTRICLE PLAX 2D LVIDd:         5.17 cm   Diastology  LVIDs:         3.99 cm   LV e' medial:    6.20 cm/s LV PW:         0.98 cm   LV E/e' medial:  14.6 LV IVS:        0.87 cm   LV e' lateral:   6.96 cm/s LVOT diam:     2.00 cm   LV E/e' lateral: 13.0 LV SV:         54 LV SV Index:   26 LVOT Area:     3.14 cm  RIGHT VENTRICLE RV Basal diam:  3.54 cm LEFT ATRIUM             Index        RIGHT ATRIUM           Index LA diam:        4.50 cm 2.16 cm/m   RA Area:     11.20 cm LA Vol (A2C):   28.3 ml 13.61 ml/m  RA Volume:   19.20 ml  9.23 ml/m LA Vol (A4C):   67.0 ml 32.23 ml/m LA Biplane Vol: 46.6 ml 22.41 ml/m  AORTIC VALVE                    PULMONIC VALVE AV Area (Vmax):    1.90 cm     PV Vmax:          1.27 m/s AV Area (Vmean):   2.04 cm     PV Vmean:         82.700 cm/s AV Area (VTI):     1.95 cm     PV VTI:           0.208 m AV Vmax:           153.00 cm/s  PV Peak grad:     6.5 mmHg AV Vmean:          97.400 cm/s  PV Mean grad:     3.0 mmHg AV VTI:            0.275 m      PR End Diast Vel: 3.05 msec AV Peak Grad:      9.4 mmHg AV Mean Grad:      5.0 mmHg LVOT Vmax:         92.70 cm/s LVOT Vmean:        63.200 cm/s LVOT VTI:          0.171 m LVOT/AV VTI ratio: 0.62  AORTA Ao Root diam: 3.10 cm MITRAL VALVE MV Area (PHT): 4.86 cm    SHUNTS MV Area VTI:   1.77 cm    Systemic VTI:  0.17 m MV Peak grad:  4.5 mmHg    Systemic Diam: 2.00 cm MV Mean grad:  3.0 mmHg MV Vmax:       1.06 m/s MV Vmean:      80.7 cm/s MV Decel Time: 156 msec MV E velocity: 90.80 cm/s MV A velocity: 90.80 cm/s MV E/A ratio:  1.00 Dwayne D Callwood MD Electronically signed by Yolonda Kida MD Signature Date/Time: 07/02/2021/3:46:32 PM    Final        Assessment & Plan:   Problem List Items Addressed This Visit     Anemia, iron deficiency    Follow cbc and iron studies.       Anxiety    On zoloft.  Follow.  Doing better.        Aortic atherosclerosis (HCC)    Continues on lipitor.       CAD (coronary artery disease), native coronary artery    Continue aspirin,  lipitor and carvedilol.  No chest pain.       Diabetes mellitus ([place])    Reviewed outside sugars.  Overall improved - averaging am 120-130s and pm 130-160s.  Continue diet and exercise.  Follow met b and a1c.       Relevant Orders   Hemoglobin A1c   Microalbumin / creatinine urine ratio   Foot pain, right    Having issues with his right foot/great toe.  Request referral to podiatry.        Relevant Orders   Ambulatory referral to Podiatry   GERD (gastroesophageal reflux disease)    No upper symptoms reported.  Continue protonix.       Hypercholesterolemia    Continue lipitor.  Low cholesterol diet and exercise.  Follow lipid panel and liver function tests.        Relevant Orders   Hepatic function panel   Lipid panel   Hyperkalemia    Elevated potassium on recent check.  Recheck stat potassium today.       Relevant Orders   Potassium (Completed)   Hypertension    Continue carvedilol and amlodipine.  Outside checks doing well - reviewed - averaging 110-120s/60-70s. Follow pressures.  Follow metabolic panel.       Relevant Orders   Basic metabolic panel   Intrathoracic goiter    Recent CT -revealed multinodular goiter with intrathoracic extension, increased in size in comparison to prior chest CT in March 2012.  Had been evaluated by Dr. Harlow Asa previously.  Biopsy negative.  Has seen Dr Harlow Asa previously.  Will refer back to confirm if any further intervention warranted.  Follow thyroid function tests.       Relevant Orders   Ambulatory referral to General Surgery   Obstructive sleep apnea    Continue cpap.  In the process of getting a new machine.  Follow.  Addendum:  Has been using cpap regularly and benefiting from continued use.       Paroxysmal atrial fibrillation (HCC)    Apparently documented in hospital. Is on carvedilol and aspirin.   Appears to be in SR today.  Follow.       Pneumonia    Recently admitted with pneumonia.  Breathing better.  Back at the  gym.  Exercising.  Seeing pulmonary.  Planning for f/u echo and cxr - has f/u scheduled 10/23/21.          Einar Pheasant, MD

## 2021-10-09 ENCOUNTER — Encounter: Payer: Self-pay | Admitting: Internal Medicine

## 2021-10-09 ENCOUNTER — Telehealth: Payer: Self-pay | Admitting: Internal Medicine

## 2021-10-09 DIAGNOSIS — M79671 Pain in right foot: Secondary | ICD-10-CM | POA: Insufficient documentation

## 2021-10-09 NOTE — Telephone Encounter (Signed)
Pt wife returning call... Pt wife is requesting a callback at [phone].Marland KitchenMarland Kitchen

## 2021-10-09 NOTE — Telephone Encounter (Signed)
Please let him know.  He was asking me yesterday at his appt.

## 2021-10-09 NOTE — Telephone Encounter (Signed)
During his appt yesterday, he mentioned that his cpap machine broke.  He needs rx faxed.  He is apparently using [employer] in [place].  He states he left a fax number - Friday 10/08/21.  Please send order or find out what we need to do to get this ordered.  If questions about where to send, please contact Mr Mckeithan for information.  Let me know if a problem and let me now if I need to do something.

## 2021-10-09 NOTE — Assessment & Plan Note (Signed)
Recent CT -revealed multinodular goiter with intrathoracic extension, increased in size in comparison to prior chest CT in March 2012.  Had been evaluated by Dr. Harlow Asa previously.  Biopsy negative.  Has seen Dr Harlow Asa previously.  Will refer back to confirm if any further intervention warranted.  Follow thyroid function tests.

## 2021-10-09 NOTE — Assessment & Plan Note (Signed)
Having issues with his right foot/great toe.  Request referral to podiatry.

## 2021-10-09 NOTE — Assessment & Plan Note (Signed)
No upper symptoms reported.  Continue protonix.  

## 2021-10-09 NOTE — Assessment & Plan Note (Signed)
Continues on lipitor.  

## 2021-10-09 NOTE — Assessment & Plan Note (Signed)
Reviewed outside sugars.  Overall improved - averaging am 120-130s and pm 130-160s.  Continue diet and exercise.  Follow met b and a1c.  

## 2021-10-09 NOTE — Assessment & Plan Note (Signed)
Continue carvedilol and amlodipine.  Outside checks doing well - reviewed - averaging 110-120s/60-70s. Follow pressures.  Follow metabolic panel.  

## 2021-10-09 NOTE — Assessment & Plan Note (Addendum)
Continue aspirin, lipitor and carvedilol.  No chest pain.  

## 2021-10-09 NOTE — Assessment & Plan Note (Signed)
Elevated potassium on recent check.  Recheck stat potassium today.

## 2021-10-09 NOTE — Telephone Encounter (Signed)
Lm for pt to cb.

## 2021-10-09 NOTE — Assessment & Plan Note (Signed)
Recently admitted with pneumonia.  Breathing better.  Back at the gym.  Exercising.  Seeing pulmonary.  Planning for f/u echo and cxr - has f/u scheduled 10/23/21.

## 2021-10-09 NOTE — Assessment & Plan Note (Signed)
Apparently documented in hospital. Is on carvedilol and aspirin.   Appears to be in SR today.  Follow.  

## 2021-10-09 NOTE — Assessment & Plan Note (Signed)
Continue lipitor.  Low cholesterol diet and exercise.  Follow lipid panel and liver function tests.   

## 2021-10-09 NOTE — Assessment & Plan Note (Signed)
On zoloft.  Follow.  Doing better.   

## 2021-10-09 NOTE — Telephone Encounter (Signed)
Yes, taken care of already. DME faxed to [facility], I spoke with him and his wife Friday.

## 2021-10-09 NOTE — Telephone Encounter (Signed)
S/w mrs Gorton - [facility] will not fill without updated sleep study. Will try to place order for new cpap through original company, [employer].  Orders faxed to [facility].

## 2021-10-09 NOTE — Assessment & Plan Note (Signed)
Follow cbc and iron studies.  

## 2021-10-09 NOTE — Assessment & Plan Note (Addendum)
Continue cpap.  In the process of getting a new machine.  Follow.  Addendum:  Has been using cpap regularly and benefiting from continued use.

## 2021-10-09 NOTE — Telephone Encounter (Signed)
Noted. Thanks.

## 2021-10-14 NOTE — Telephone Encounter (Signed)
Wife has another questions about patient's CPAP machine. Please call her.

## 2021-10-15 NOTE — Telephone Encounter (Signed)
S/w Mr Chait - has not heard from [employer] as of yet - will f/u and call him back

## 2021-10-15 NOTE — Telephone Encounter (Signed)
S/w Sandy at [employer] received - being reviewed Per Lovey Newcomer, Mr Cassis should hear from CPAP team this week.  Pt and pt wife advised

## 2021-10-21 DIAGNOSIS — M79671 Pain in right foot: Secondary | ICD-10-CM | POA: Diagnosis not present

## 2021-10-21 DIAGNOSIS — M10071 Idiopathic gout, right ankle and foot: Secondary | ICD-10-CM | POA: Diagnosis not present

## 2021-10-21 DIAGNOSIS — E118 Type 2 diabetes mellitus with unspecified complications: Secondary | ICD-10-CM | POA: Diagnosis not present

## 2021-10-21 NOTE — Telephone Encounter (Signed)
Pt wife called stating they have not heard anything about cpap machine

## 2021-10-22 NOTE — Telephone Encounter (Signed)
S/w [employer] - order has been placed, [employer] looking at insurance coverage and if any other documentation is needed from [place]. If [employer] needs further information from [place], they will send a fax.  Pt wife advised of all.

## 2021-10-23 DIAGNOSIS — R0602 Shortness of breath: Secondary | ICD-10-CM | POA: Diagnosis not present

## 2021-10-24 DIAGNOSIS — G5133 Clonic hemifacial spasm, bilateral: Secondary | ICD-10-CM | POA: Diagnosis not present

## 2021-10-24 DIAGNOSIS — G510 Bell's palsy: Secondary | ICD-10-CM | POA: Diagnosis not present

## 2021-10-28 NOTE — Telephone Encounter (Signed)
Clinical notes faxed in to [employer] per faxed request

## 2021-11-02 ENCOUNTER — Other Ambulatory Visit: Payer: Self-pay | Admitting: Internal Medicine

## 2021-11-04 NOTE — Telephone Encounter (Signed)
Pt wife called in requesting callback... Pt stated that she wanted to give you an update... Pt wife requesting callback

## 2021-11-06 ENCOUNTER — Telehealth: Payer: Self-pay

## 2021-11-06 NOTE — Telephone Encounter (Signed)
Mary called from [employer] to request prescription for dehumidifier and unheated tubing for patient.  Stanton Kidney states she would also like to verify the pressure setting for patient's CPAP machine.  Stanton Kidney states her fax number is:  [phone]

## 2021-11-06 NOTE — Telephone Encounter (Signed)
Lm for Ricardo Nash to cb

## 2021-11-13 NOTE — Telephone Encounter (Signed)
Rx faxed

## 2021-11-15 NOTE — Telephone Encounter (Signed)
Ash From [employer] called in stating that they need addendum clinical notes for pt cpap machine... Ash stated to call [phone] and use [phone].Marland KitchenMarland Kitchen

## 2021-11-18 ENCOUNTER — Other Ambulatory Visit: Payer: Self-pay | Admitting: Internal Medicine

## 2021-11-18 ENCOUNTER — Other Ambulatory Visit: Payer: Self-pay | Admitting: Family

## 2021-11-19 DIAGNOSIS — E049 Nontoxic goiter, unspecified: Secondary | ICD-10-CM | POA: Diagnosis not present

## 2021-11-19 DIAGNOSIS — E042 Nontoxic multinodular goiter: Secondary | ICD-10-CM | POA: Diagnosis not present

## 2021-11-20 ENCOUNTER — Other Ambulatory Visit: Payer: Self-pay | Admitting: Surgery

## 2021-11-20 DIAGNOSIS — E049 Nontoxic goiter, unspecified: Secondary | ICD-10-CM

## 2021-11-20 DIAGNOSIS — E042 Nontoxic multinodular goiter: Secondary | ICD-10-CM

## 2021-11-20 NOTE — Telephone Encounter (Signed)
Pt wife called in requesting to speak with you about CPAP machine... Pt wife is requesting callback

## 2021-11-20 NOTE — Telephone Encounter (Signed)
S/w pt wife - states she was advised by CPAP company that paper being faxed that needs to be filled out for pt's CPAP. Once received, will fill out and send back

## 2021-11-21 ENCOUNTER — Ambulatory Visit
Admission: RE | Admit: 2021-11-21 | Discharge: 2021-11-21 | Disposition: A | Discharge status: Home / Self Care | Payer: Medicare PPO | Source: Ambulatory Visit | Attending: Surgery | Admitting: Surgery

## 2021-11-21 DIAGNOSIS — G510 Bell's palsy: Secondary | ICD-10-CM | POA: Diagnosis not present

## 2021-11-21 DIAGNOSIS — E049 Nontoxic goiter, unspecified: Secondary | ICD-10-CM

## 2021-11-21 DIAGNOSIS — R1311 Dysphagia, oral phase: Secondary | ICD-10-CM | POA: Diagnosis not present

## 2021-11-21 DIAGNOSIS — R471 Dysarthria and anarthria: Secondary | ICD-10-CM | POA: Diagnosis not present

## 2021-11-21 DIAGNOSIS — E042 Nontoxic multinodular goiter: Secondary | ICD-10-CM | POA: Diagnosis not present

## 2021-11-22 NOTE — Telephone Encounter (Signed)
Addendum added to note

## 2021-11-22 NOTE — Telephone Encounter (Signed)
S/w [employer] - no form needed - need addendum for note 5/30 stating pt is using and benefiting form CPAP use.

## 2021-11-23 NOTE — Progress Notes (Signed)
Overall the USN is relatively stable.  There is a nodule on the left that has increased in size and will require biopsy.  Claiborne Billings - please order USN guided FNA biopsy of nodule #5, 4.0 cm, left superior thyroid lobe.  I will contact the patient with the results when available.  [place], MD [facility] [place] practice Office: [phone]

## 2021-11-25 NOTE — Telephone Encounter (Signed)
Rx ok'd for xanax #30 with no refills.  ?

## 2021-11-25 NOTE — Telephone Encounter (Signed)
Pt called in requesting refill on medication (ALPRAZolam (XANAX) 0.25 MG tablet)... Pt requesting callback.Marland KitchenMarland Kitchen

## 2021-11-25 NOTE — Telephone Encounter (Signed)
Addended note faxed.

## 2021-11-27 DIAGNOSIS — G510 Bell's palsy: Secondary | ICD-10-CM | POA: Diagnosis not present

## 2021-11-27 DIAGNOSIS — J329 Chronic sinusitis, unspecified: Secondary | ICD-10-CM | POA: Diagnosis not present

## 2021-12-02 ENCOUNTER — Other Ambulatory Visit: Payer: Self-pay | Admitting: Family

## 2021-12-05 ENCOUNTER — Other Ambulatory Visit: Payer: Self-pay | Admitting: Surgery

## 2021-12-05 DIAGNOSIS — E042 Nontoxic multinodular goiter: Secondary | ICD-10-CM

## 2021-12-11 ENCOUNTER — Other Ambulatory Visit: Payer: Self-pay | Admitting: Internal Medicine

## 2021-12-18 ENCOUNTER — Ambulatory Visit
Admission: RE | Admit: 2021-12-18 | Discharge: 2021-12-18 | Disposition: A | Discharge status: Home / Self Care | Payer: Medicare PPO | Source: Ambulatory Visit | Attending: Surgery | Admitting: Surgery

## 2021-12-18 ENCOUNTER — Other Ambulatory Visit (HOSPITAL_COMMUNITY)
Admission: RE | Admit: 2021-12-18 | Discharge: 2021-12-18 | Disposition: A | Discharge status: Home / Self Care | Payer: Medicare PPO | Source: Ambulatory Visit | Attending: Surgery | Admitting: Surgery

## 2021-12-18 DIAGNOSIS — E041 Nontoxic single thyroid nodule: Secondary | ICD-10-CM | POA: Diagnosis not present

## 2021-12-18 DIAGNOSIS — E042 Nontoxic multinodular goiter: Secondary | ICD-10-CM | POA: Diagnosis not present

## 2021-12-19 LAB — CYTOLOGY - NON PAP

## 2021-12-24 NOTE — Progress Notes (Signed)
Biopsy is benign.  Changes in the USN year-to-year were not significant.  Will plan to see him back in one year with a repeat USN and TSH level.  Claiborne Billings - please arrange follow up and notify patient.  Thanks.  [place], MD [facility] [place] practice Office: [phone]

## 2021-12-25 DIAGNOSIS — R0989 Other specified symptoms and signs involving the circulatory and respiratory systems: Secondary | ICD-10-CM | POA: Diagnosis not present

## 2021-12-25 DIAGNOSIS — I480 Paroxysmal atrial fibrillation: Secondary | ICD-10-CM | POA: Diagnosis not present

## 2021-12-25 DIAGNOSIS — I10 Essential (primary) hypertension: Secondary | ICD-10-CM | POA: Diagnosis not present

## 2021-12-25 DIAGNOSIS — E118 Type 2 diabetes mellitus with unspecified complications: Secondary | ICD-10-CM | POA: Diagnosis not present

## 2021-12-25 DIAGNOSIS — G4733 Obstructive sleep apnea (adult) (pediatric): Secondary | ICD-10-CM | POA: Diagnosis not present

## 2021-12-31 DIAGNOSIS — H903 Sensorineural hearing loss, bilateral: Secondary | ICD-10-CM | POA: Diagnosis not present

## 2022-02-05 ENCOUNTER — Other Ambulatory Visit: Payer: Medicare PPO

## 2022-02-06 ENCOUNTER — Other Ambulatory Visit (INDEPENDENT_AMBULATORY_CARE_PROVIDER_SITE_OTHER): Payer: Medicare PPO

## 2022-02-06 DIAGNOSIS — E7800 Pure hypercholesterolemia, unspecified: Secondary | ICD-10-CM

## 2022-02-06 DIAGNOSIS — E1159 Type 2 diabetes mellitus with other circulatory complications: Secondary | ICD-10-CM

## 2022-02-06 DIAGNOSIS — I10 Essential (primary) hypertension: Secondary | ICD-10-CM

## 2022-02-06 LAB — HEPATIC FUNCTION PANEL
ALT: 20 U/L (ref 0–53)
AST: 18 U/L (ref 0–37)
Albumin: 4.1 g/dL (ref 3.5–5.2)
Alkaline Phosphatase: 52 U/L (ref 39–117)
Bilirubin, Direct: 0.1 mg/dL (ref 0.0–0.3)
Total Bilirubin: 0.6 mg/dL (ref 0.2–1.2)
Total Protein: 6.4 g/dL (ref 6.0–8.3)

## 2022-02-06 LAB — HEMOGLOBIN A1C: Hgb A1c MFr Bld: 7.3 % — ABNORMAL HIGH (ref 4.6–6.5)

## 2022-02-06 LAB — LIPID PANEL
Cholesterol: 157 mg/dL (ref 0–200)
HDL: 31.6 mg/dL — ABNORMAL LOW (ref >39.00)
Total CHOL/HDL Ratio: 5
Triglycerides: 469 mg/dL — ABNORMAL HIGH (ref 0.0–149.0)

## 2022-02-06 LAB — MICROALBUMIN / CREATININE URINE RATIO
Creatinine,U: 123.5 mg/dL
Microalb Creat Ratio: 2.6 mg/g (ref 0.0–30.0)
Microalb, Ur: 3.2 mg/dL — ABNORMAL HIGH (ref 0.0–1.9)

## 2022-02-06 LAB — BASIC METABOLIC PANEL
BUN: 22 mg/dL (ref 6–23)
CO2: 26 mEq/L (ref 19–32)
Calcium: 9.4 mg/dL (ref 8.4–10.5)
Chloride: 105 mEq/L (ref 96–112)
Creatinine, Ser: 1.16 mg/dL (ref 0.40–1.50)
GFR: 60.68 mL/min (ref >60.00)
Glucose, Bld: 158 mg/dL — ABNORMAL HIGH (ref 70–99)
Potassium: 4.9 mEq/L (ref 3.5–5.1)
Sodium: 139 mEq/L (ref 135–145)

## 2022-02-06 LAB — LDL CHOLESTEROL, DIRECT: Direct LDL: 80 mg/dL

## 2022-02-10 ENCOUNTER — Ambulatory Visit: Payer: Medicare PPO | Admitting: Internal Medicine

## 2022-02-10 ENCOUNTER — Encounter: Payer: Self-pay | Admitting: Internal Medicine

## 2022-02-10 ENCOUNTER — Other Ambulatory Visit: Payer: Self-pay | Admitting: Internal Medicine

## 2022-02-10 VITALS — BP 130/60 | HR 67 | Temp 98.1°F | Ht 69.0 in | Wt 205.0 lb

## 2022-02-10 DIAGNOSIS — I480 Paroxysmal atrial fibrillation: Secondary | ICD-10-CM

## 2022-02-10 DIAGNOSIS — I2510 Atherosclerotic heart disease of native coronary artery without angina pectoris: Secondary | ICD-10-CM

## 2022-02-10 DIAGNOSIS — E1159 Type 2 diabetes mellitus with other circulatory complications: Secondary | ICD-10-CM | POA: Diagnosis not present

## 2022-02-10 DIAGNOSIS — D509 Iron deficiency anemia, unspecified: Secondary | ICD-10-CM

## 2022-02-10 DIAGNOSIS — Z23 Encounter for immunization: Secondary | ICD-10-CM | POA: Diagnosis not present

## 2022-02-10 DIAGNOSIS — E041 Nontoxic single thyroid nodule: Secondary | ICD-10-CM

## 2022-02-10 DIAGNOSIS — G4733 Obstructive sleep apnea (adult) (pediatric): Secondary | ICD-10-CM | POA: Diagnosis not present

## 2022-02-10 DIAGNOSIS — K219 Gastro-esophageal reflux disease without esophagitis: Secondary | ICD-10-CM

## 2022-02-10 DIAGNOSIS — I700 Atherosclerosis of aorta: Secondary | ICD-10-CM

## 2022-02-10 DIAGNOSIS — I10 Essential (primary) hypertension: Secondary | ICD-10-CM | POA: Diagnosis not present

## 2022-02-10 DIAGNOSIS — E7800 Pure hypercholesterolemia, unspecified: Secondary | ICD-10-CM

## 2022-02-10 DIAGNOSIS — F419 Anxiety disorder, unspecified: Secondary | ICD-10-CM

## 2022-02-10 DIAGNOSIS — M545 Low back pain, unspecified: Secondary | ICD-10-CM

## 2022-02-10 MED ORDER — AMLODIPINE BESYLATE 10 MG PO TABS: 10.0000 mg | ORAL_TABLET | Freq: Every day | ORAL | 2 refills | Status: DC

## 2022-02-10 MED ORDER — CARVEDILOL 25 MG PO TABS: 25.0000 mg | ORAL_TABLET | Freq: Two times a day (BID) | ORAL | 3 refills | Status: DC

## 2022-02-10 MED ORDER — SERTRALINE HCL 100 MG PO TABS: 100.0000 mg | ORAL_TABLET | Freq: Every day | ORAL | 1 refills | Status: DC

## 2022-02-10 MED ORDER — PIOGLITAZONE HCL 15 MG PO TABS: 15.0000 mg | ORAL_TABLET | Freq: Every day | ORAL | 1 refills | Status: DC

## 2022-02-10 MED ORDER — TRULICITY 1.5 MG/0.5ML ~~LOC~~ SOAJ: 1.5000 mg | SUBCUTANEOUS | 3 refills | Status: DC

## 2022-02-10 MED ORDER — PANTOPRAZOLE SODIUM 40 MG PO TBEC: 40.0000 mg | DELAYED_RELEASE_TABLET | Freq: Every day | ORAL | 1 refills | Status: DC

## 2022-02-10 NOTE — Progress Notes (Signed)
Patient ID: Ricardo Nash, male   DOB: [birth year], 76 y.o.   MRN: [id number]   Subjective:    Patient ID: Ricardo Nash, male    DOB: 24-Nov-1945, 76 y.o.   MRN: [id number]   Patient here for  Chief Complaint  Patient presents with   Follow-up    4 month follow up   .   HPI Here to follow up regarding his blood pressure, blood sugar and cholesterol.  Saw Dr Ubaldo Glassing 12/25/21.  Carvedilol increased to 12.4m bid.  Saw Dr GHarlow Asa S/p thyroid biopsy - benign.  Recommended f/u in one year with ultrasound and TSH. Had seen pulmonary 10/2021.  Recommended cxr and PFTs in 3 months.    Past Medical History:  Diagnosis Date   C. difficile colitis    Coronary artery disease    Depression    Diabetes mellitus ([facility]    Diverticulitis    Diverticulosis    Dysrhythmia    PAF   Environmental allergies    GERD (gastroesophageal reflux disease)    Goiter    intrathoracic, s/p benign biopsy (Dr GHarlow Asa   Hypercholesterolemia    Hypertension    Osteoarthritis    cervical spine, lumbar spine   Paroxysmal atrial fibrillation (HCC)    Pneumonia    PONV (postoperative nausea and vomiting)    Sleep apnea    CPAP @ NIGHT   Stroke ([facility]    TIA (transient ischemic attack)    Past Surgical History:  Procedure Laterality Date   BACK SURGERY  8/08   s/p fusion of L4-L5   capsule endoscopy     CATARACT EXTRACTION  2011   Dr. HHerbert Deaner  CERVICAL DISC SURGERY  2002   COLONOSCOPY WITH PROPOFOL N/A 07/20/2017   Procedure: COLONOSCOPY WITH PROPOFOL;  Surgeon: EManya Silvas MD;  [facility]  Service: Endoscopy;  Laterality: N/A;   EYE SURGERY     cataract bilateral 10/1999   eyelid reduction     Dr. CKerby LessHERNIA REPAIR  1991   Dr, SCassell ClementLAMINECTOMY/DECOMPRESSION MICRODISCECTOMY N/A 10/15/2020   Procedure: Laminectomy - Lumbar Two-Lumbar Three - Lumbar Three-Lumbar Four with sublaminar decompression;  Surgeon: JEustace Moore MD;  [place] [facility] [place]  Service:  Neurosurgery;  Laterality: N/A;  Laminectomy - Lumbar Two-Lumbar Three - Lumbar Three-Lumbar Four with sublaminar decompression   NOSE SURGERY     turbinate reduction   SEPTOPLASTY  1975   SHOULDER SURGERY  2000   rotator cuff   Family History  Problem Relation Age of Onset   Congestive Heart Failure Father    Heart disease Father        myocardial infarction   Rheumatic fever Father        valvular disease   Heart disease Mother        s/p CABG (age 369   Kidney disease Sister    Colon cancer Neg Hx    Prostate cancer Neg Hx    Social History   Socioeconomic History   Marital status: Married    Spouse name: Not on file   Number of children: 2   Years of education: Not on file   Highest education level: Not on file  Occupational History   Occupation: retired tPharmacist, hospital Tobacco Use   Smoking status: Former   Smokeless tobacco: Never  VScientific laboratory technicianUse: Never used  Substance and Sexual Activity   Alcohol use: Yes    Alcohol/week:  0.0 standard drinks of alcohol    Comment: occasional   Drug use: No   Sexual activity: Not Currently  Other Topics Concern   Not on file  Social History Narrative   Not on file   Social Determinants of Health   Financial Resource Strain: Low Risk  (06/21/2021)   Overall Financial Resource Strain (CARDIA)    Difficulty of Paying Living Expenses: Not hard at all  Food Insecurity: No Food Insecurity (06/21/2021)   Hunger Vital Sign    Worried About Running Out of Food in the Last Year: Never true    Ran Out of Food in the Last Year: Never true  Transportation Needs: No Transportation Needs (06/21/2021)   PRAPARE - Hydrologist (Medical): No    Lack of Transportation (Non-Medical): No  Physical Activity: Sufficiently Active (06/21/2021)   Exercise Vital Sign    Days of Exercise per Week: 5 days    Minutes of Exercise per Session: 60 min  Stress: No Stress Concern Present (06/21/2021)   [place]    Feeling of Stress : Not at all  Social Connections: Unknown (06/21/2021)   Social Connection and Isolation Panel [NHANES]    Frequency of Communication with Friends and Family: Not on file    Frequency of Social Gatherings with Friends and Family: Not on file    Attends Religious Services: Not on file    Active Member of Clubs or Organizations: Not on file    Attends Archivist Meetings: Not on file    Marital Status: Married     Review of Systems     Objective:     BP 130/60 (BP Location: Left Arm, Patient Position: Sitting, Cuff Size: Normal)   Pulse 67   Temp 98.1 F (36.7 C) (Oral)   Ht _0  (1.753 m)   Wt 205 lb (93 kg)   SpO2 97%   BMI 30.27 kg/m  Wt Readings from Last 3 Encounters:  02/10/22 205 lb (93 kg)  10/08/21 203 lb 3.2 oz (92.2 kg)  08/21/21 193 lb (87.5 kg)    Physical Exam   Outpatient Encounter Medications as of 02/10/2022  Medication Sig   ACCU-CHEK GUIDE test strip USE AS DIRECTED TO TEST BLOOD GLUCOSE LEVELS TWICE DAILY   ALPRAZolam (XANAX) 0.25 MG tablet TAKE 1 TABLET BY MOUTH ONCE DAILY AS NEEDED.   amLODipine (NORVASC) 10 MG tablet Take 1 tablet (10 mg total) by mouth daily.   Ascorbic Acid (VITAMIN C) 100 MG tablet Take 100 mg by mouth in the morning.   aspirin EC 81 MG tablet Take 81 mg by mouth every evening. Swallow whole.   atorvastatin (LIPITOR) 20 MG tablet TAKE 1 TABLET BY MOUTH ONCE EVERY EVENING   Azelastine-Fluticasone 137-50 MCG/ACT SUSP PLACE 1 SPRAY INTO EACH NOSTRIL ONCE EVERY MORNING AND AT BEDTIME   blood glucose meter kit and supplies Dispense based on patient and insurance preference. Use up to four times daily as directed. (FOR ICD-10 E10.9, E11.9).   carvedilol (COREG) 25 MG tablet Take 1 tablet (25 mg total) by mouth 2 (two) times daily with a meal.   Dulaglutide (TRULICITY) 3.00 TM/2.2QJ SOPN INJECT 0.75MG SUBQ ONCE A WEEK   levocetirizine (XYZAL) 5 MG  tablet TAKE 1 TABLET BY MOUTH ONCE EVERY EVENING   lisinopril (ZESTRIL) 40 MG tablet TAKE 1 TABLET BY MOUTH ONCE DAILY   meloxicam (MOBIC) 15 MG tablet Take 15  mg by mouth daily.   metFORMIN (GLUCOPHAGE) 1000 MG tablet TAKE 1 TABLET BY MOUTH TWICE DAILY WITH MEALS   montelukast (SINGULAIR) 10 MG tablet TAKE 1 TABLET BY MOUTH AT BEDTIME   Multiple Vitamin (MULTIVITAMIN WITH MINERALS) TABS tablet Take 1 tablet by mouth in the morning.   pantoprazole (PROTONIX) 40 MG tablet Take 1 tablet (40 mg total) by mouth daily.   pioglitazone (ACTOS) 15 MG tablet Take 1 tablet (15 mg total) by mouth daily.   sertraline (ZOLOFT) 100 MG tablet Take 1 tablet (100 mg total) by mouth daily.   tamsulosin (FLOMAX) 0.4 MG CAPS capsule Take 2 capsules (0.8 mg total) by mouth daily after supper.   testosterone cypionate (DEPOTESTOSTERONE CYPIONATE) 200 MG/ML injection Inject 1 mL (200 mg total) into the muscle every 14 (fourteen) days.   traZODone (DESYREL) 50 MG tablet TAKE 1 and 1/2 TABLETS BY MOUTH AT BEDTIME   triamterene-hydrochlorothiazide (MAXZIDE-25) 37.5-25 MG tablet TAKE 1 TABLET BY MOUTH ONCE DAILY   [DISCONTINUED] amLODipine (NORVASC) 10 MG tablet TAKE 1 TABLET BY MOUTH ONCE DAILY   [DISCONTINUED] Azelastine-Fluticasone (DYMISTA) 137-50 MCG/ACT SUSP Place 1 spray into the nose in the morning and at bedtime.   [DISCONTINUED] carvedilol (COREG) 25 MG tablet TAKE 1 TABLET BY MOUTH TWICE DAILY WITH A MEAL   [DISCONTINUED] pantoprazole (PROTONIX) 40 MG tablet TAKE 1 TABLET BY MOUTH ONCE DAILY   [DISCONTINUED] pioglitazone (ACTOS) 15 MG tablet TAKE 1 TABLET BY MOUTH ONCE DAILY   [DISCONTINUED] sertraline (ZOLOFT) 100 MG tablet TAKE 1 TABLET BY MOUTH ONCE DAILY   No facility-administered encounter medications on file as of 02/10/2022.     Lab Results  Component Value Date   WBC 7.9 10/04/2021   HGB 13.6 10/04/2021   HCT 41.5 10/04/2021   PLT 250.0 10/04/2021   GLUCOSE 158 (H) 02/06/2022   CHOL 157  02/06/2022   TRIG (H) 02/06/2022    469.0 Triglyceride is over 400; calculations on Lipids are invalid.   HDL 31.60 (L) 02/06/2022   LDLDIRECT 80.0 02/06/2022   LDLCALC 58 11/21/2013   ALT 20 02/06/2022   AST 18 02/06/2022   NA 139 02/06/2022   K 4.9 02/06/2022   CL 105 02/06/2022   CREATININE 1.16 02/06/2022   BUN 22 02/06/2022   CO2 26 02/06/2022   TSH 1.10 07/11/2021   PSA 0.82 06/10/2019   INR 0.9 10/11/2020   HGBA1C 7.3 (H) 02/06/2022   MICROALBUR 3.2 (H) 02/06/2022    [place] FNA BX THYROID 1ST LESION AFIRMA  Result Date: 12/18/2021 INDICATION: Left superior thyroid nodule 4.0 cm EXAM: ULTRASOUND GUIDED FINE NEEDLE ASPIRATION OF INDETERMINATE THYROID NODULE COMPARISON:  US Thyroid 11/22/21 Previous biopsies of nodule #2 and nodule #8 02/2008:  Benign MEDICATIONS: 10 cc 1% lidocaine COMPLICATIONS: None immediate. TECHNIQUE: Informed written consent was obtained from the patient after a discussion of the risks, benefits and alternatives to treatment. Questions regarding the procedure were encouraged and answered. A timeout was performed prior to the initiation of the procedure. Pre-procedural ultrasound scanning demonstrated unchanged size and appearance of the indeterminate nodule within the left thyroid The procedure was planned. The neck was prepped in the usual sterile fashion, and a sterile drape was applied covering the operative field. A timeout was performed prior to the initiation of the procedure. Local anesthesia was provided with 1% lidocaine. Under direct ultrasound guidance, 5 FNA biopsies were performed of the left superior thyroid nodule with a 27 gauge needle. 2 samples were obtained for [facility] Multiple  ultrasound images were saved for procedural documentation purposes. The samples were prepared and submitted to pathology. Limited post procedural scanning was negative for hematoma or additional complication. Dressings were placed. The patient tolerated the above procedures  procedure well without immediate postprocedural complication. FINDINGS: Nodule reference number based on prior diagnostic ultrasound: 5 Maximum size: 4.0 cm Location: Left; Superior ACR TI-RADS risk category: TR3 (3 points) Reason for biopsy: meets ACR TI-RADS criteria Ultrasound imaging confirms appropriate placement of the needles within the thyroid nodule. IMPRESSION: Technically successful ultrasound guided fine needle aspiration of left superior thyroid nodule Read by Lavonia Drafts [facility] Electronically Signed   By: Aletta Edouard M.D.   On: 12/18/2021 16:12       Assessment & Plan:   Problem List Items Addressed This Visit   None    Einar Pheasant, MD

## 2022-02-11 ENCOUNTER — Telehealth: Payer: Self-pay

## 2022-02-11 NOTE — Telephone Encounter (Signed)
See me before calling him.   either one is fine ,  the doses are different- and also getting it in the same arm helped to have a robust response.  Some prefer to get the same on, but either one is fine.

## 2022-02-11 NOTE — Telephone Encounter (Signed)
Patient states he has had the pfizer covid vaccines and boosters in the past.  Patient states [facility] has the moderna covid vaccine.  Patient states he would like to know if he should wait and get a pfizer vaccine or would it be safe for him to have the moderna vaccine.

## 2022-02-12 NOTE — Telephone Encounter (Signed)
Patient called and advised of response. He stated that he felt okay with getting the [place] would schedule.

## 2022-02-13 ENCOUNTER — Encounter: Payer: Self-pay | Admitting: Internal Medicine

## 2022-02-13 NOTE — Assessment & Plan Note (Signed)
Continue cpap.  

## 2022-02-13 NOTE — Assessment & Plan Note (Signed)
Continue carvedilol and amlodipine.  Outside checks doing well - reviewed - averaging 110-120s/60-70s. Follow pressures.  Follow metabolic panel.

## 2022-02-13 NOTE — Assessment & Plan Note (Signed)
Apparently documented in hospital. Is on carvedilol and aspirin.   Appears to be in SR today.  Follow.  

## 2022-02-13 NOTE — Assessment & Plan Note (Signed)
Persistent back pain and hip, shoulder, finger and hand pain.  Request referral to Dr Sharlet Salina.

## 2022-02-13 NOTE — Assessment & Plan Note (Signed)
Continue lipitor.  Low cholesterol diet and exercise.  Follow lipid panel and liver function tests.   

## 2022-02-13 NOTE — Assessment & Plan Note (Signed)
Saw Dr Gerkin:  (7/023) -Overall the USN is relatively stable.  There is a nodule on the left that has increased in size and will require biopsy. Kelly - please order USN guided FNA biopsy of nodule.  12/24/21 - Biopsy is benign.  Changes in the USN year-to-year were not significant.  Will plan to see him back in one year with a repeat USN and TSH level. 

## 2022-02-13 NOTE — Assessment & Plan Note (Signed)
Follow cbc and iron studies.  

## 2022-02-13 NOTE — Assessment & Plan Note (Signed)
Continue aspirin, lipitor and carvedilol.  No chest pain.  

## 2022-02-13 NOTE — Assessment & Plan Note (Addendum)
Reviewed outside sugars - attached. Continue diet and exercise.  Follow met b and a1c. Low carb diet and exercise.

## 2022-02-13 NOTE — Assessment & Plan Note (Signed)
On zoloft.  Follow.  Doing better.

## 2022-02-13 NOTE — Assessment & Plan Note (Signed)
No upper symptoms reported.  Continue protonix.  

## 2022-02-13 NOTE — Assessment & Plan Note (Signed)
Continues on lipitor.  

## 2022-02-21 ENCOUNTER — Other Ambulatory Visit: Payer: Medicare PPO

## 2022-02-21 DIAGNOSIS — E291 Testicular hypofunction: Secondary | ICD-10-CM | POA: Diagnosis not present

## 2022-02-22 LAB — PSA: Prostate Specific Ag, Serum: 0.9 ng/mL (ref 0.0–4.0)

## 2022-02-22 LAB — HEMOGLOBIN AND HEMATOCRIT, BLOOD
Hematocrit: 43.5 % (ref 37.5–51.0)
Hemoglobin: 14.3 g/dL (ref 13.0–17.7)

## 2022-02-22 LAB — TESTOSTERONE: Testosterone: 103 ng/dL — ABNORMAL LOW (ref 264–916)

## 2022-02-27 ENCOUNTER — Ambulatory Visit (INDEPENDENT_AMBULATORY_CARE_PROVIDER_SITE_OTHER): Payer: Medicare PPO | Admitting: Urology

## 2022-02-27 VITALS — BP 153/70 | HR 70 | Ht 69.0 in | Wt 200.0 lb

## 2022-02-27 DIAGNOSIS — E291 Testicular hypofunction: Secondary | ICD-10-CM

## 2022-02-27 DIAGNOSIS — R32 Unspecified urinary incontinence: Secondary | ICD-10-CM

## 2022-02-27 DIAGNOSIS — N3281 Overactive bladder: Secondary | ICD-10-CM | POA: Diagnosis not present

## 2022-02-27 DIAGNOSIS — N138 Other obstructive and reflux uropathy: Secondary | ICD-10-CM

## 2022-02-27 DIAGNOSIS — R3915 Urgency of urination: Secondary | ICD-10-CM

## 2022-02-27 DIAGNOSIS — N401 Enlarged prostate with lower urinary tract symptoms: Secondary | ICD-10-CM

## 2022-02-27 LAB — BLADDER SCAN AMB NON-IMAGING: Scan Result: 0

## 2022-02-27 MED ORDER — TAMSULOSIN HCL 0.4 MG PO CAPS: 0.8000 mg | ORAL_CAPSULE | Freq: Every day | ORAL | 7 refills | Status: DC

## 2022-02-27 MED ORDER — TESTOSTERONE CYPIONATE 200 MG/ML IM SOLN: 200.0000 mg | INTRAMUSCULAR | 1 refills | Status: DC

## 2022-02-27 NOTE — Progress Notes (Signed)
   02/27/2022 1:43 PM   [place] [birth year] [id number]  Reason for visit: Follow up low testosterone, ED, urinary symptoms/BPH/OAB  HPI: Comorbid 76 year old male previously followed at [facility] urology for the above issues.  Comorbidities include sleep apnea, diabetes(recent hemoglobin A1c 7.3), hypertension, depression.  He has been on testosterone injections every 2 weeks long-term and PSA has remained low, less than 1.  He reports significant improvement on the testosterone and his mood, energy, and overall quality of life.  He has failed PDE 5 inhibitors and injections with Trimix for ED, and he is no longer interested in pursuing treatment option for ED. At our last visit we changed his Flomax to the evening, and increase the dose to 0.8 mg for weak stream.  He noticed significant improvement with that change in the medication.  He does not have any nocturia.  Only urinary complaint is some urgency and frequency during the day.  He drinks primarily water.  We reviewed behavioral strategies today including timed voiding and avoiding bladder irritants, as well as improve diabetes control.  PVR today 40m.  He continues to use his testosterone injections.  I reviewed his recent labs from 02/21/2022 showing a low testosterone of 103, normal PSA of 0.9, and normal hematocrit of 43.5.  He reports that before he started testosterone his values were usually below 50.  He still feels the testosterone is working well.    -Flowmax 0.8 mg refilled -Testosterone refilled -RTC lab visit 6 months testosterone, H/H, call results -Plan to continue lab work every 6 months, in person visits yearly for ongoing management of LUTS and low testosterone   [place] SHomeland ParkBV[place] [phone]

## 2022-03-17 ENCOUNTER — Other Ambulatory Visit: Payer: Self-pay | Admitting: Internal Medicine

## 2022-03-27 DIAGNOSIS — M545 Low back pain, unspecified: Secondary | ICD-10-CM | POA: Diagnosis not present

## 2022-03-27 DIAGNOSIS — M25552 Pain in left hip: Secondary | ICD-10-CM | POA: Diagnosis not present

## 2022-03-27 DIAGNOSIS — M25559 Pain in unspecified hip: Secondary | ICD-10-CM | POA: Diagnosis not present

## 2022-03-27 DIAGNOSIS — M25551 Pain in right hip: Secondary | ICD-10-CM | POA: Diagnosis not present

## 2022-04-18 DIAGNOSIS — M533 Sacrococcygeal disorders, not elsewhere classified: Secondary | ICD-10-CM | POA: Diagnosis not present

## 2022-04-18 DIAGNOSIS — M25559 Pain in unspecified hip: Secondary | ICD-10-CM | POA: Diagnosis not present

## 2022-04-18 DIAGNOSIS — M545 Low back pain, unspecified: Secondary | ICD-10-CM | POA: Diagnosis not present

## 2022-04-21 ENCOUNTER — Telehealth: Payer: Self-pay

## 2022-04-21 DIAGNOSIS — M6281 Muscle weakness (generalized): Secondary | ICD-10-CM | POA: Diagnosis not present

## 2022-04-21 DIAGNOSIS — M5459 Other low back pain: Secondary | ICD-10-CM | POA: Diagnosis not present

## 2022-04-21 DIAGNOSIS — R2689 Other abnormalities of gait and mobility: Secondary | ICD-10-CM | POA: Diagnosis not present

## 2022-04-21 NOTE — Telephone Encounter (Signed)
Patient states his Dulaglutide (TRULICITY) 1.5 GE/4.0TV SOPN is doing well.  Patient states his blood sugar is down and next time we refill he would like to switch to 27-monthsupply.  Patient states he has enough left for a week.  *Patient states his [place] [facility] [place]

## 2022-04-22 ENCOUNTER — Other Ambulatory Visit: Payer: Self-pay | Admitting: Family

## 2022-04-22 ENCOUNTER — Other Ambulatory Visit: Payer: Self-pay | Admitting: Internal Medicine

## 2022-04-22 ENCOUNTER — Other Ambulatory Visit: Payer: Self-pay

## 2022-04-22 MED ORDER — TRULICITY 1.5 MG/0.5ML ~~LOC~~ SOAJ: 1.5000 mg | SUBCUTANEOUS | 1 refills | Status: DC

## 2022-04-22 NOTE — Telephone Encounter (Signed)
sent 

## 2022-04-25 DIAGNOSIS — R2689 Other abnormalities of gait and mobility: Secondary | ICD-10-CM | POA: Diagnosis not present

## 2022-04-25 DIAGNOSIS — M5459 Other low back pain: Secondary | ICD-10-CM | POA: Diagnosis not present

## 2022-04-25 DIAGNOSIS — M6281 Muscle weakness (generalized): Secondary | ICD-10-CM | POA: Diagnosis not present

## 2022-04-28 DIAGNOSIS — M5459 Other low back pain: Secondary | ICD-10-CM | POA: Diagnosis not present

## 2022-04-28 DIAGNOSIS — M6281 Muscle weakness (generalized): Secondary | ICD-10-CM | POA: Diagnosis not present

## 2022-04-28 DIAGNOSIS — R2689 Other abnormalities of gait and mobility: Secondary | ICD-10-CM | POA: Diagnosis not present

## 2022-04-29 DIAGNOSIS — H04129 Dry eye syndrome of unspecified lacrimal gland: Secondary | ICD-10-CM | POA: Diagnosis not present

## 2022-04-29 DIAGNOSIS — E119 Type 2 diabetes mellitus without complications: Secondary | ICD-10-CM | POA: Diagnosis not present

## 2022-04-29 DIAGNOSIS — H353131 Nonexudative age-related macular degeneration, bilateral, early dry stage: Secondary | ICD-10-CM | POA: Diagnosis not present

## 2022-04-29 DIAGNOSIS — H1045 Other chronic allergic conjunctivitis: Secondary | ICD-10-CM | POA: Diagnosis not present

## 2022-04-29 DIAGNOSIS — G510 Bell's palsy: Secondary | ICD-10-CM | POA: Diagnosis not present

## 2022-04-29 LAB — HM DIABETES EYE EXAM

## 2022-05-02 DIAGNOSIS — D23112 Other benign neoplasm of skin of right lower eyelid, including canthus: Secondary | ICD-10-CM | POA: Diagnosis not present

## 2022-05-02 DIAGNOSIS — L590 Erythema ab igne [dermatitis ab igne]: Secondary | ICD-10-CM | POA: Diagnosis not present

## 2022-05-02 DIAGNOSIS — L821 Other seborrheic keratosis: Secondary | ICD-10-CM | POA: Diagnosis not present

## 2022-05-06 ENCOUNTER — Other Ambulatory Visit: Payer: Self-pay | Admitting: Family

## 2022-05-07 ENCOUNTER — Other Ambulatory Visit: Payer: Self-pay

## 2022-05-07 DIAGNOSIS — E119 Type 2 diabetes mellitus without complications: Secondary | ICD-10-CM | POA: Diagnosis not present

## 2022-05-07 DIAGNOSIS — M533 Sacrococcygeal disorders, not elsewhere classified: Secondary | ICD-10-CM | POA: Diagnosis not present

## 2022-05-12 HISTORY — PX: BACK SURGERY: SHX140

## 2022-05-12 HISTORY — PX: CHOLECYSTECTOMY: SHX55

## 2022-05-14 DIAGNOSIS — M5459 Other low back pain: Secondary | ICD-10-CM | POA: Diagnosis not present

## 2022-05-14 DIAGNOSIS — M6281 Muscle weakness (generalized): Secondary | ICD-10-CM | POA: Diagnosis not present

## 2022-05-14 DIAGNOSIS — R2689 Other abnormalities of gait and mobility: Secondary | ICD-10-CM | POA: Diagnosis not present

## 2022-05-19 DIAGNOSIS — R2689 Other abnormalities of gait and mobility: Secondary | ICD-10-CM | POA: Diagnosis not present

## 2022-05-19 DIAGNOSIS — M5459 Other low back pain: Secondary | ICD-10-CM | POA: Diagnosis not present

## 2022-05-19 DIAGNOSIS — M6281 Muscle weakness (generalized): Secondary | ICD-10-CM | POA: Diagnosis not present

## 2022-05-23 DIAGNOSIS — M5459 Other low back pain: Secondary | ICD-10-CM | POA: Diagnosis not present

## 2022-05-23 DIAGNOSIS — M6281 Muscle weakness (generalized): Secondary | ICD-10-CM | POA: Diagnosis not present

## 2022-05-23 DIAGNOSIS — R2689 Other abnormalities of gait and mobility: Secondary | ICD-10-CM | POA: Diagnosis not present

## 2022-05-24 ENCOUNTER — Other Ambulatory Visit: Payer: Self-pay | Admitting: Internal Medicine

## 2022-05-26 DIAGNOSIS — R2689 Other abnormalities of gait and mobility: Secondary | ICD-10-CM | POA: Diagnosis not present

## 2022-05-26 DIAGNOSIS — M5459 Other low back pain: Secondary | ICD-10-CM | POA: Diagnosis not present

## 2022-05-26 DIAGNOSIS — M6281 Muscle weakness (generalized): Secondary | ICD-10-CM | POA: Diagnosis not present

## 2022-05-28 DIAGNOSIS — M5459 Other low back pain: Secondary | ICD-10-CM | POA: Diagnosis not present

## 2022-05-28 DIAGNOSIS — M6281 Muscle weakness (generalized): Secondary | ICD-10-CM | POA: Diagnosis not present

## 2022-05-28 DIAGNOSIS — R2689 Other abnormalities of gait and mobility: Secondary | ICD-10-CM | POA: Diagnosis not present

## 2022-05-31 DIAGNOSIS — G4733 Obstructive sleep apnea (adult) (pediatric): Secondary | ICD-10-CM | POA: Diagnosis not present

## 2022-06-02 DIAGNOSIS — M6281 Muscle weakness (generalized): Secondary | ICD-10-CM | POA: Diagnosis not present

## 2022-06-02 DIAGNOSIS — M5459 Other low back pain: Secondary | ICD-10-CM | POA: Diagnosis not present

## 2022-06-02 DIAGNOSIS — R2689 Other abnormalities of gait and mobility: Secondary | ICD-10-CM | POA: Diagnosis not present

## 2022-06-04 DIAGNOSIS — M5459 Other low back pain: Secondary | ICD-10-CM | POA: Diagnosis not present

## 2022-06-04 DIAGNOSIS — M6281 Muscle weakness (generalized): Secondary | ICD-10-CM | POA: Diagnosis not present

## 2022-06-04 DIAGNOSIS — R2689 Other abnormalities of gait and mobility: Secondary | ICD-10-CM | POA: Diagnosis not present

## 2022-06-04 DIAGNOSIS — G4733 Obstructive sleep apnea (adult) (pediatric): Secondary | ICD-10-CM | POA: Diagnosis not present

## 2022-06-09 DIAGNOSIS — R2689 Other abnormalities of gait and mobility: Secondary | ICD-10-CM | POA: Diagnosis not present

## 2022-06-09 DIAGNOSIS — M5459 Other low back pain: Secondary | ICD-10-CM | POA: Diagnosis not present

## 2022-06-09 DIAGNOSIS — M6281 Muscle weakness (generalized): Secondary | ICD-10-CM | POA: Diagnosis not present

## 2022-06-10 ENCOUNTER — Other Ambulatory Visit (INDEPENDENT_AMBULATORY_CARE_PROVIDER_SITE_OTHER): Payer: Medicare PPO

## 2022-06-10 DIAGNOSIS — E7800 Pure hypercholesterolemia, unspecified: Secondary | ICD-10-CM | POA: Diagnosis not present

## 2022-06-10 DIAGNOSIS — E1159 Type 2 diabetes mellitus with other circulatory complications: Secondary | ICD-10-CM | POA: Diagnosis not present

## 2022-06-10 LAB — HEPATIC FUNCTION PANEL
ALT: 20 U/L (ref 0–53)
AST: 15 U/L (ref 0–37)
Albumin: 4.3 g/dL (ref 3.5–5.2)
Alkaline Phosphatase: 46 U/L (ref 39–117)
Bilirubin, Direct: 0.1 mg/dL (ref 0.0–0.3)
Total Bilirubin: 0.7 mg/dL (ref 0.2–1.2)
Total Protein: 6.6 g/dL (ref 6.0–8.3)

## 2022-06-10 LAB — LIPID PANEL
Cholesterol: 162 mg/dL (ref 0–200)
HDL: 36 mg/dL — ABNORMAL LOW (ref >39.00)
NonHDL: 126.09
Total CHOL/HDL Ratio: 5
Triglycerides: 338 mg/dL — ABNORMAL HIGH (ref 0.0–149.0)
VLDL: 67.6 mg/dL — ABNORMAL HIGH (ref 0.0–40.0)

## 2022-06-10 LAB — BASIC METABOLIC PANEL
BUN: 21 mg/dL (ref 6–23)
CO2: 25 mEq/L (ref 19–32)
Calcium: 9.7 mg/dL (ref 8.4–10.5)
Chloride: 103 mEq/L (ref 96–112)
Creatinine, Ser: 1.06 mg/dL (ref 0.40–1.50)
GFR: 67.45 mL/min (ref >60.00)
Glucose, Bld: 136 mg/dL — ABNORMAL HIGH (ref 70–99)
Potassium: 5.2 mEq/L — ABNORMAL HIGH (ref 3.5–5.1)
Sodium: 138 mEq/L (ref 135–145)

## 2022-06-10 LAB — LDL CHOLESTEROL, DIRECT: Direct LDL: 89 mg/dL

## 2022-06-10 LAB — HEMOGLOBIN A1C: Hgb A1c MFr Bld: 7.2 % — ABNORMAL HIGH (ref 4.6–6.5)

## 2022-06-11 ENCOUNTER — Other Ambulatory Visit: Payer: Self-pay

## 2022-06-11 ENCOUNTER — Other Ambulatory Visit: Payer: Self-pay | Admitting: Internal Medicine

## 2022-06-11 DIAGNOSIS — R2689 Other abnormalities of gait and mobility: Secondary | ICD-10-CM | POA: Diagnosis not present

## 2022-06-11 DIAGNOSIS — M6281 Muscle weakness (generalized): Secondary | ICD-10-CM | POA: Diagnosis not present

## 2022-06-11 DIAGNOSIS — E875 Hyperkalemia: Secondary | ICD-10-CM

## 2022-06-11 DIAGNOSIS — M5459 Other low back pain: Secondary | ICD-10-CM | POA: Diagnosis not present

## 2022-06-13 ENCOUNTER — Encounter: Payer: Self-pay | Admitting: Internal Medicine

## 2022-06-13 ENCOUNTER — Ambulatory Visit (INDEPENDENT_AMBULATORY_CARE_PROVIDER_SITE_OTHER): Payer: Medicare PPO | Admitting: Internal Medicine

## 2022-06-13 VITALS — BP 128/68 | HR 82 | Temp 97.8°F | Resp 16 | Ht 69.0 in | Wt 198.0 lb

## 2022-06-13 DIAGNOSIS — I700 Atherosclerosis of aorta: Secondary | ICD-10-CM | POA: Diagnosis not present

## 2022-06-13 DIAGNOSIS — E875 Hyperkalemia: Secondary | ICD-10-CM | POA: Diagnosis not present

## 2022-06-13 DIAGNOSIS — E1159 Type 2 diabetes mellitus with other circulatory complications: Secondary | ICD-10-CM | POA: Diagnosis not present

## 2022-06-13 DIAGNOSIS — F439 Reaction to severe stress, unspecified: Secondary | ICD-10-CM

## 2022-06-13 DIAGNOSIS — D509 Iron deficiency anemia, unspecified: Secondary | ICD-10-CM | POA: Diagnosis not present

## 2022-06-13 DIAGNOSIS — M545 Low back pain, unspecified: Secondary | ICD-10-CM

## 2022-06-13 DIAGNOSIS — E049 Nontoxic goiter, unspecified: Secondary | ICD-10-CM

## 2022-06-13 DIAGNOSIS — G4733 Obstructive sleep apnea (adult) (pediatric): Secondary | ICD-10-CM

## 2022-06-13 DIAGNOSIS — I480 Paroxysmal atrial fibrillation: Secondary | ICD-10-CM

## 2022-06-13 DIAGNOSIS — E7800 Pure hypercholesterolemia, unspecified: Secondary | ICD-10-CM

## 2022-06-13 DIAGNOSIS — I2510 Atherosclerotic heart disease of native coronary artery without angina pectoris: Secondary | ICD-10-CM

## 2022-06-13 DIAGNOSIS — Z0000 Encounter for general adult medical examination without abnormal findings: Secondary | ICD-10-CM | POA: Diagnosis not present

## 2022-06-13 DIAGNOSIS — I10 Essential (primary) hypertension: Secondary | ICD-10-CM

## 2022-06-13 DIAGNOSIS — K219 Gastro-esophageal reflux disease without esophagitis: Secondary | ICD-10-CM

## 2022-06-13 DIAGNOSIS — R0981 Nasal congestion: Secondary | ICD-10-CM

## 2022-06-13 DIAGNOSIS — F419 Anxiety disorder, unspecified: Secondary | ICD-10-CM | POA: Diagnosis not present

## 2022-06-13 DIAGNOSIS — E041 Nontoxic single thyroid nodule: Secondary | ICD-10-CM

## 2022-06-13 LAB — POTASSIUM: Potassium: 5 mEq/L (ref 3.5–5.1)

## 2022-06-13 MED ORDER — PANTOPRAZOLE SODIUM 40 MG PO TBEC: 40.0000 mg | DELAYED_RELEASE_TABLET | Freq: Every day | ORAL | 1 refills | Status: DC

## 2022-06-13 MED ORDER — SERTRALINE HCL 100 MG PO TABS: 100.0000 mg | ORAL_TABLET | Freq: Every day | ORAL | 1 refills | Status: DC

## 2022-06-13 MED ORDER — PIOGLITAZONE HCL 15 MG PO TABS: 15.0000 mg | ORAL_TABLET | Freq: Every day | ORAL | 1 refills | Status: DC

## 2022-06-13 MED ORDER — ATORVASTATIN CALCIUM 20 MG PO TABS: ORAL_TABLET | ORAL | 1 refills | Status: DC

## 2022-06-13 MED ORDER — TRIAMTERENE-HCTZ 37.5-25 MG PO TABS: 1.0000 | ORAL_TABLET | Freq: Every day | ORAL | 1 refills | Status: DC

## 2022-06-13 MED ORDER — AZITHROMYCIN 250 MG PO TABS: ORAL_TABLET | ORAL | 0 refills | Status: AC

## 2022-06-13 MED ORDER — METFORMIN HCL 1000 MG PO TABS: 1000.0000 mg | ORAL_TABLET | Freq: Two times a day (BID) | ORAL | 1 refills | Status: DC

## 2022-06-13 MED ORDER — TRAZODONE HCL 50 MG PO TABS: ORAL_TABLET | ORAL | 1 refills | Status: DC

## 2022-06-13 NOTE — Progress Notes (Unsigned)
Subjective:    Patient ID: Ricardo Nash, male    DOB: 05-04-1946, 77 y.o.   MRN: [id number]  Patient here for  Chief Complaint  Patient presents with   Annual Exam    HPI Here for physical. Saw Dr Ubaldo Glassing 12/25/21. Carvedilol increased to 12.'5mg'$  bid. Saw Dr Harlow Asa. S/p thyroid biopsy - benign. Recommended f/u in one year with ultrasound and TSH. Had f/u with Dr Diamantina Providence 02/27/22 - recommended continuing testosterone and flomax with f/u in 6 months.  No chest pain or sob reported.  Main concern is back pain.  Left low back pain.  Seeing [facility].  Seeing Pain clinic through [facility].  Going to therapy. Doing exercises as home.  Still going to the [facility] and riding bike.  Blood pressures (outside) - reviewed. Blood sugars reviewed.  Reports Thanksgiving - "cold".  Is having now - head and chest congestion.  Increased drainage.  Colored mucus production.  No chest pain or sob reported.  No vomiting.  Bowels stable.    Past Medical History:  Diagnosis Date   C. difficile colitis    Coronary artery disease    Depression    Diabetes mellitus ([place])    Diverticulitis    Diverticulosis    Dysrhythmia    PAF   Environmental allergies    GERD (gastroesophageal reflux disease)    Goiter    intrathoracic, s/p benign biopsy (Dr Harlow Asa)   Hypercholesterolemia    Hypertension    Osteoarthritis    cervical spine, lumbar spine   Paroxysmal atrial fibrillation (HCC)    Pneumonia    PONV (postoperative nausea and vomiting)    Sleep apnea    CPAP @ NIGHT   Stroke [facility])    TIA (transient ischemic attack)    Past Surgical History:  Procedure Laterality Date   BACK SURGERY  8/08   s/p fusion of L4-L5   capsule endoscopy     CATARACT EXTRACTION  2011   Dr. Herbert Deaner   CERVICAL DISC SURGERY  2002   COLONOSCOPY WITH PROPOFOL N/A 07/20/2017   Procedure: COLONOSCOPY WITH PROPOFOL;  Surgeon: Manya Silvas, MD;  Location: [facility] ENDOSCOPY;  Service: Endoscopy;  Laterality: N/A;   EYE SURGERY      cataract bilateral 10/1999   eyelid reduction     Dr. Kerby Less HERNIA REPAIR  1991   Dr, Cassell Clement LAMINECTOMY/DECOMPRESSION MICRODISCECTOMY N/A 10/15/2020   Procedure: Laminectomy - Lumbar Two-Lumbar Three - Lumbar Three-Lumbar Four with sublaminar decompression;  Surgeon: Eustace Moore, MD;  Location: [place];  Service: Neurosurgery;  Laterality: N/A;  Laminectomy - Lumbar Two-Lumbar Three - Lumbar Three-Lumbar Four with sublaminar decompression   NOSE SURGERY     turbinate reduction   SEPTOPLASTY  1975   SHOULDER SURGERY  2000   rotator cuff   Family History  Problem Relation Age of Onset   Congestive Heart Failure Father    Heart disease Father        myocardial infarction   Rheumatic fever Father        valvular disease   Heart disease Mother        s/p CABG (age 52)   Kidney disease Sister    Colon cancer Neg Hx    Prostate cancer Neg Hx    Social History   Socioeconomic History   Marital status: Married    Spouse name: Not on file   Number of children: 2   Years of education: Not on  file   Highest education level: Not on file  Occupational History   Occupation: retired Pharmacist, hospital  Tobacco Use   Smoking status: Former   Smokeless tobacco: Never  Scientific laboratory technician Use: Never used  Substance and Sexual Activity   Alcohol use: Yes    Alcohol/week: 0.0 standard drinks of alcohol    Comment: occasional   Drug use: No   Sexual activity: Not Currently  Other Topics Concern   Not on file  Social History Narrative   Not on file   Social Determinants of Health   Financial Resource Strain: Low Risk  (06/21/2021)   Overall Financial Resource Strain (CARDIA)    Difficulty of Paying Living Expenses: Not hard at all  Food Insecurity: No Food Insecurity (06/21/2021)   Hunger Vital Sign    Worried About Running Out of Food in the Last Year: Never true    Ran Out of Food in the Last Year: Never true  Transportation Needs: No Transportation Needs (06/21/2021)    PRAPARE - Hydrologist (Medical): No    Lack of Transportation (Non-Medical): No  Physical Activity: Sufficiently Active (06/21/2021)   Exercise Vital Sign    Days of Exercise per Week: 5 days    Minutes of Exercise per Session: 60 min  Stress: No Stress Concern Present (06/21/2021)   [place]    Feeling of Stress : Not at all  Social Connections: Unknown (06/21/2021)   Social Connection and Isolation Panel [NHANES]    Frequency of Communication with Friends and Family: Not on file    Frequency of Social Gatherings with Friends and Family: Not on file    Attends Religious Services: Not on file    Active Member of Clubs or Organizations: Not on file    Attends Archivist Meetings: Not on file    Marital Status: Married     Review of Systems  Constitutional:  Negative for appetite change and unexpected weight change.  HENT:  Positive for congestion, postnasal drip and sinus pressure. Negative for sore throat.   Eyes:  Negative for pain and visual disturbance.  Respiratory:  Negative for chest tightness and shortness of breath.        No increased cough.   Cardiovascular:  Negative for chest pain and palpitations.  Gastrointestinal:  Negative for abdominal pain, diarrhea, nausea and vomiting.  Genitourinary:  Negative for difficulty urinating and dysuria.  Musculoskeletal:  Positive for back pain. Negative for joint swelling and myalgias.  Skin:  Negative for color change and rash.  Neurological:  Negative for dizziness and headaches.  Hematological:  Negative for adenopathy. Does not bruise/bleed easily.  Psychiatric/Behavioral:  Negative for agitation, decreased concentration and dysphoric mood.        Objective:     BP 128/68   Pulse 82   Temp 97.8 F (36.6 C)   Resp 16   Ht '5\' 9"'$  (1.753 m)   Wt 198 lb (89.8 kg)   SpO2 97%   BMI 29.24 kg/m  Wt Readings from Last 3  Encounters:  06/13/22 198 lb (89.8 kg)  02/27/22 200 lb (90.7 kg)  02/10/22 205 lb (93 kg)    Physical Exam Constitutional:      General: He is not in acute distress.    Appearance: Normal appearance. He is well-developed.  HENT:     Head: Normocephalic and atraumatic.     Comments: Increased sinus pressure  to palpation.     Right Ear: External ear normal.     Left Ear: External ear normal.  Eyes:     General: No scleral icterus.       Right eye: No discharge.        Left eye: No discharge.     Conjunctiva/sclera: Conjunctivae normal.  Neck:     Thyroid: No thyromegaly.  Cardiovascular:     Rate and Rhythm: Normal rate and regular rhythm.  Pulmonary:     Effort: No respiratory distress.     Breath sounds: Normal breath sounds. No wheezing.  Abdominal:     General: Bowel sounds are normal.     Palpations: Abdomen is soft.     Tenderness: There is no abdominal tenderness.  Musculoskeletal:        General: No swelling or tenderness.     Cervical back: Neck supple. No tenderness.  Lymphadenopathy:     Cervical: No cervical adenopathy.  Skin:    Findings: No erythema or rash.  Neurological:     Mental Status: He is alert and oriented to person, place, and time.  Psychiatric:        Mood and Affect: Mood normal.        Behavior: Behavior normal.      Outpatient Encounter Medications as of 06/13/2022  Medication Sig   azithromycin (ZITHROMAX) 250 MG tablet Take 2 tablets on day 1, then 1 tablet daily on days 2 through 5   ACCU-CHEK GUIDE test strip USE AS DIRECTED TO TEST BLOOD GLUCOSE LEVELS TWICE DAILY   ALPRAZolam (XANAX) 0.25 MG tablet TAKE 1 TABLET BY MOUTH ONCE DAILY AS NEEDED.   amLODipine (NORVASC) 10 MG tablet Take 1 tablet (10 mg total) by mouth daily.   Ascorbic Acid (VITAMIN C) 100 MG tablet Take 100 mg by mouth in the morning.   aspirin EC 81 MG tablet Take 81 mg by mouth every evening. Swallow whole.   atorvastatin (LIPITOR) 20 MG tablet TAKE 1 TABLET BY  MOUTH ONCE EVERY EVENING   Azelastine-Fluticasone 137-50 MCG/ACT SUSP PLACE 1 SPRAY INTO EACH NOSTRIL ONCE EVERY MORNING AND AT BEDTIME   blood glucose meter kit and supplies Dispense based on patient and insurance preference. Use up to four times daily as directed. (FOR ICD-10 E10.9, E11.9).   carvedilol (COREG) 25 MG tablet TAKE 1 TABLET BY MOUTH TWICE DAILY WITH MEALS   Dulaglutide (TRULICITY) 1.5 CB/6.3AG SOPN Inject 1.5 mg into the skin once a week.   levocetirizine (XYZAL) 5 MG tablet TAKE 1 TABLET BY MOUTH ONCE EVERY EVENING   lisinopril (ZESTRIL) 40 MG tablet TAKE 1 TABLET BY MOUTH ONCE DAILY   meloxicam (MOBIC) 15 MG tablet Take 15 mg by mouth daily.   metFORMIN (GLUCOPHAGE) 1000 MG tablet Take 1 tablet (1,000 mg total) by mouth 2 (two) times daily with a meal.   montelukast (SINGULAIR) 10 MG tablet TAKE 1 TABLET BY MOUTH AT BEDTIME   Multiple Vitamin (MULTIVITAMIN WITH MINERALS) TABS tablet Take 1 tablet by mouth in the morning.   pantoprazole (PROTONIX) 40 MG tablet Take 1 tablet (40 mg total) by mouth daily.   pioglitazone (ACTOS) 15 MG tablet Take 1 tablet (15 mg total) by mouth daily.   sertraline (ZOLOFT) 100 MG tablet Take 1 tablet (100 mg total) by mouth daily.   tamsulosin (FLOMAX) 0.4 MG CAPS capsule Take 2 capsules (0.8 mg total) by mouth daily after supper.   testosterone cypionate (DEPOTESTOSTERONE CYPIONATE) 200 MG/ML injection Inject 1  mL (200 mg total) into the muscle every 14 (fourteen) days.   traZODone (DESYREL) 50 MG tablet TAKE 1 and 1/2 TABLETS BY MOUTH AT BEDTIME   triamterene-hydrochlorothiazide (MAXZIDE-25) 37.5-25 MG tablet Take 1 tablet by mouth daily.   [DISCONTINUED] atorvastatin (LIPITOR) 20 MG tablet TAKE 1 TABLET BY MOUTH ONCE EVERY EVENING   [DISCONTINUED] metFORMIN (GLUCOPHAGE) 1000 MG tablet TAKE 1 TABLET BY MOUTH TWICE DAILY WITH MEALS   [DISCONTINUED] pantoprazole (PROTONIX) 40 MG tablet Take 1 tablet (40 mg total) by mouth daily.   [DISCONTINUED]  pioglitazone (ACTOS) 15 MG tablet Take 1 tablet (15 mg total) by mouth daily.   [DISCONTINUED] sertraline (ZOLOFT) 100 MG tablet Take 1 tablet (100 mg total) by mouth daily.   [DISCONTINUED] traZODone (DESYREL) 50 MG tablet TAKE 1 and 1/2 TABLETS BY MOUTH AT BEDTIME   [DISCONTINUED] triamterene-hydrochlorothiazide (MAXZIDE-25) 37.5-25 MG tablet TAKE 1 TABLET BY MOUTH ONCE DAILY   No facility-administered encounter medications on file as of 06/13/2022.     Lab Results  Component Value Date   WBC 7.9 10/04/2021   HGB 14.3 02/21/2022   HCT 43.5 02/21/2022   PLT 250.0 10/04/2021   GLUCOSE 136 (H) 06/10/2022   CHOL 162 06/10/2022   TRIG 338.0 (H) 06/10/2022   HDL 36.00 (L) 06/10/2022   LDLDIRECT 89.0 06/10/2022   LDLCALC 58 11/21/2013   ALT 20 06/10/2022   AST 15 06/10/2022   NA 138 06/10/2022   K 5.0 06/13/2022   CL 103 06/10/2022   CREATININE 1.06 06/10/2022   BUN 21 06/10/2022   CO2 25 06/10/2022   TSH 1.10 07/11/2021   PSA 0.82 06/10/2019   INR 0.9 10/11/2020   HGBA1C 7.2 (H) 06/10/2022   MICROALBUR 3.2 (H) 02/06/2022    [place] FNA BX THYROID 1ST LESION AFIRMA  Result Date: 12/18/2021 INDICATION: Left superior thyroid nodule 4.0 cm EXAM: ULTRASOUND GUIDED FINE NEEDLE ASPIRATION OF INDETERMINATE THYROID NODULE COMPARISON:  US Thyroid 11/22/21 Previous biopsies of nodule #2 and nodule #8 02/2008:  Benign MEDICATIONS: 10 cc 1% lidocaine COMPLICATIONS: None immediate. TECHNIQUE: Informed written consent was obtained from the patient after a discussion of the risks, benefits and alternatives to treatment. Questions regarding the procedure were encouraged and answered. A timeout was performed prior to the initiation of the procedure. Pre-procedural ultrasound scanning demonstrated unchanged size and appearance of the indeterminate nodule within the left thyroid The procedure was planned. The neck was prepped in the usual sterile fashion, and a sterile drape was applied covering the operative  field. A timeout was performed prior to the initiation of the procedure. Local anesthesia was provided with 1% lidocaine. Under direct ultrasound guidance, 5 FNA biopsies were performed of the left superior thyroid nodule with a 27 gauge needle. 2 samples were obtained for [facility] Multiple ultrasound images were saved for procedural documentation purposes. The samples were prepared and submitted to pathology. Limited post procedural scanning was negative for hematoma or additional complication. Dressings were placed. The patient tolerated the above procedures procedure well without immediate postprocedural complication. FINDINGS: Nodule reference number based on prior diagnostic ultrasound: 5 Maximum size: 4.0 cm Location: Left; Superior ACR TI-RADS risk category: TR3 (3 points) Reason for biopsy: meets ACR TI-RADS criteria Ultrasound imaging confirms appropriate placement of the needles within the thyroid nodule. IMPRESSION: Technically successful ultrasound guided fine needle aspiration of left superior thyroid nodule Read by Lavonia Drafts [facility] Electronically Signed   By: Aletta Edouard M.D.   On: 12/18/2021 16:12  Assessment & Plan:  Routine general medical examination at a health care facility  Type 2 diabetes mellitus with other circulatory complication, without long-term current use of insulin ([place]) Assessment & Plan: Reviewed outside sugars - attached. Continue diet and exercise.  Follow met b and a1c. Low carb diet and exercise.    Orders: -     Hemoglobin A1c; Future  Hypercholesterolemia Assessment & Plan: Continue lipitor.  Low cholesterol diet and exercise.  Follow lipid panel and liver function tests.    Orders: -     Lipid panel; Future -     Hepatic function panel; Future -     Basic metabolic panel; Future  Primary hypertension Assessment & Plan: Continue carvedilol and amlodipine.  Outside checks doing well - reviewed.  Follow pressures.  Follow metabolic panel.    Orders: -     CBC with Differential/Platelet; Future -     TSH; Future  Health care maintenance Assessment & Plan: Physical today 06/12/22.  Followed by urology for prostate checks.  Colonoscopy 07/2017 - tubular adenomatous polyps    Hyperkalemia Assessment & Plan: Slight elevation on recent check.  Recheck today.   Orders: -     Potassium  Iron deficiency anemia, unspecified iron deficiency anemia type Assessment & Plan: Follow cbc and iron studies.    Anxiety Assessment & Plan: On zoloft.  Follow.  Stable.      Aortic atherosclerosis ([place]) Assessment & Plan: Continues on lipitor.    Low back pain without sciatica, unspecified back pain laterality, unspecified chronicity Assessment & Plan: Persistent back pain.  Being followed pain clinic - [facility].    Coronary artery disease involving native coronary artery of native heart without angina pectoris Assessment & Plan: Continue aspirin, lipitor and carvedilol.  No chest pain.    Gastroesophageal reflux disease, unspecified whether esophagitis present Assessment & Plan: No upper symptoms reported.  Continue protonix.    Intrathoracic goiter Assessment & Plan:  Saw Dr Harlow Asa. S/p thyroid biopsy - benign. Recommended f/u in one year with ultrasound and TSH.   Nontoxic uninodular goiter Assessment & Plan: Saw Dr Harlow Asa:  (7/023) -Overall the USN is relatively stable.  There is a nodule on the left that has increased in size and will require biopsy. Claiborne Billings - please order USN guided FNA biopsy of nodule.  12/24/21 - Biopsy is benign.  Changes in the USN year-to-year were not significant.  Will plan to see him back in one year with a repeat USN and TSH level.   Obstructive sleep apnea Assessment & Plan: Continue cpap.    Paroxysmal atrial fibrillation (HCC) Assessment & Plan: Apparently documented in hospital. Is on carvedilol and aspirin.   Appears to be in SR today.  Follow.    Stress Assessment &  Plan: Continue zoloft.  Overall appears to be handling things relatively well.  Follow.      Congestion of nasal sinus Assessment & Plan: Head and chest congestion as outlined.  No chest pain or sob.  Saline nasal spray and steroid nasal spray.  Robitussin DM.  Zpak as directed.  Follow.  Call with update.    Other orders -     Atorvastatin Calcium; TAKE 1 TABLET BY MOUTH ONCE EVERY EVENING  Dispense: 90 tablet; Refill: 1 -     metFORMIN HCl; Take 1 tablet (1,000 mg total) by mouth 2 (two) times daily with a meal.  Dispense: 180 tablet; Refill: 1 -     Pantoprazole Sodium; Take 1 tablet (40  mg total) by mouth daily.  Dispense: 90 tablet; Refill: 1 -     Pioglitazone HCl; Take 1 tablet (15 mg total) by mouth daily.  Dispense: 90 tablet; Refill: 1 -     Sertraline HCl; Take 1 tablet (100 mg total) by mouth daily.  Dispense: 90 tablet; Refill: 1 -     traZODone HCl; TAKE 1 and 1/2 TABLETS BY MOUTH AT BEDTIME  Dispense: 135 tablet; Refill: 1 -     Triamterene-HCTZ; Take 1 tablet by mouth daily.  Dispense: 90 tablet; Refill: 1 -     Azithromycin; Take 2 tablets on day 1, then 1 tablet daily on days 2 through 5  Dispense: 6 tablet; Refill: 0     Einar Pheasant, MD

## 2022-06-13 NOTE — Assessment & Plan Note (Signed)
Physical today 06/12/22.  Followed by urology for prostate checks.  Colonoscopy 07/2017 - tubular adenomatous polyps

## 2022-06-15 ENCOUNTER — Encounter: Payer: Self-pay | Admitting: Internal Medicine

## 2022-06-15 DIAGNOSIS — R0981 Nasal congestion: Secondary | ICD-10-CM | POA: Insufficient documentation

## 2022-06-15 NOTE — Assessment & Plan Note (Signed)
Saw Dr Harlow Asa:  (7/023) -Overall the USN is relatively stable.  There is a nodule on the left that has increased in size and will require biopsy. Ricardo Nash - please order USN guided FNA biopsy of nodule.  12/24/21 - Biopsy is benign.  Changes in the USN year-to-year were not significant.  Will plan to see him back in one year with a repeat USN and TSH level.

## 2022-06-15 NOTE — Assessment & Plan Note (Signed)
Head and chest congestion as outlined.  No chest pain or sob.  Saline nasal spray and steroid nasal spray.  Robitussin DM.  Zpak as directed.  Follow.  Call with update.

## 2022-06-15 NOTE — Assessment & Plan Note (Signed)
Saw Dr Harlow Asa. S/p thyroid biopsy - benign. Recommended f/u in one year with ultrasound and TSH.

## 2022-06-15 NOTE — Assessment & Plan Note (Signed)
Continue zoloft.  Overall appears to be handling things relatively well.  Follow.

## 2022-06-15 NOTE — Assessment & Plan Note (Signed)
Continue aspirin, lipitor and carvedilol.  No chest pain.

## 2022-06-15 NOTE — Assessment & Plan Note (Signed)
Slight elevation on recent check.  Recheck today.

## 2022-06-15 NOTE — Assessment & Plan Note (Signed)
Continue lipitor.  Low cholesterol diet and exercise.  Follow lipid panel and liver function tests.

## 2022-06-15 NOTE — Assessment & Plan Note (Signed)
On zoloft.  Follow.  Stable.

## 2022-06-15 NOTE — Assessment & Plan Note (Signed)
Continues on lipitor.

## 2022-06-15 NOTE — Assessment & Plan Note (Signed)
Continue carvedilol and amlodipine.  Outside checks doing well - reviewed.  Follow pressures.  Follow metabolic panel.

## 2022-06-15 NOTE — Assessment & Plan Note (Signed)
No upper symptoms reported.  Continue protonix.  

## 2022-06-15 NOTE — Assessment & Plan Note (Signed)
Continue cpap.  

## 2022-06-15 NOTE — Assessment & Plan Note (Addendum)
Persistent back pain.  Being followed pain clinic - [facility].

## 2022-06-15 NOTE — Assessment & Plan Note (Signed)
Reviewed outside sugars - attached. Continue diet and exercise.  Follow met b and a1c. Low carb diet and exercise.   

## 2022-06-15 NOTE — Assessment & Plan Note (Signed)
Follow cbc and iron studies.  

## 2022-06-15 NOTE — Assessment & Plan Note (Signed)
Apparently documented in hospital. Is on carvedilol and aspirin.   Appears to be in SR today.  Follow.

## 2022-06-16 DIAGNOSIS — R2689 Other abnormalities of gait and mobility: Secondary | ICD-10-CM | POA: Diagnosis not present

## 2022-06-16 DIAGNOSIS — M5459 Other low back pain: Secondary | ICD-10-CM | POA: Diagnosis not present

## 2022-06-16 DIAGNOSIS — M6281 Muscle weakness (generalized): Secondary | ICD-10-CM | POA: Diagnosis not present

## 2022-06-17 ENCOUNTER — Other Ambulatory Visit: Payer: Self-pay | Admitting: Internal Medicine

## 2022-06-18 DIAGNOSIS — M5459 Other low back pain: Secondary | ICD-10-CM | POA: Diagnosis not present

## 2022-06-18 DIAGNOSIS — R2689 Other abnormalities of gait and mobility: Secondary | ICD-10-CM | POA: Diagnosis not present

## 2022-06-18 DIAGNOSIS — M6281 Muscle weakness (generalized): Secondary | ICD-10-CM | POA: Diagnosis not present

## 2022-06-23 DIAGNOSIS — R2689 Other abnormalities of gait and mobility: Secondary | ICD-10-CM | POA: Diagnosis not present

## 2022-06-23 DIAGNOSIS — M6281 Muscle weakness (generalized): Secondary | ICD-10-CM | POA: Diagnosis not present

## 2022-06-23 DIAGNOSIS — M5459 Other low back pain: Secondary | ICD-10-CM | POA: Diagnosis not present

## 2022-06-30 ENCOUNTER — Ambulatory Visit (INDEPENDENT_AMBULATORY_CARE_PROVIDER_SITE_OTHER): Payer: Medicare PPO

## 2022-06-30 VITALS — Ht 69.0 in | Wt 198.0 lb

## 2022-06-30 DIAGNOSIS — M6281 Muscle weakness (generalized): Secondary | ICD-10-CM | POA: Diagnosis not present

## 2022-06-30 DIAGNOSIS — Z0000 Encounter for general adult medical examination without abnormal findings: Secondary | ICD-10-CM

## 2022-06-30 DIAGNOSIS — M5459 Other low back pain: Secondary | ICD-10-CM | POA: Diagnosis not present

## 2022-06-30 DIAGNOSIS — R2689 Other abnormalities of gait and mobility: Secondary | ICD-10-CM | POA: Diagnosis not present

## 2022-06-30 NOTE — Progress Notes (Signed)
Subjective:   Ricardo Nash is a 77 y.o. male who presents for Medicare Annual/Subsequent preventive examination.  Review of Systems    No ROS.  Medicare Wellness Virtual Visit.  Visual/audio telehealth visit, UTA vital signs.   See social history for additional risk factors.   Cardiac Risk Factors include: advanced age (>36mn, >>62women);male gender;diabetes mellitus     Objective:    Today's Vitals   06/30/22 1348  Weight: 198 lb (89.8 kg)  Height: 5' 9"$  (1.753 m)   Body mass index is 29.24 kg/m.     06/30/2022    1:51 PM 07/06/2021    7:30 PM 07/02/2021    7:56 PM 07/02/2021   12:00 AM 06/29/2021    9:51 PM 06/21/2021   10:43 AM 10/15/2020   11:10 AM  Advanced Directives  Does Patient Have a Medical Advance Directive? Yes Yes Yes Yes No;Yes Yes No  Type of AParamedicof [place] will Healthcare Power of [place] facility DNR (pink MOST or yellow form);Living will;Healthcare Power of [place]oneLiving will [place] will  Does patient want to make changes to medical advance directive? No - Patient declined No - Patient declined No - Patient declined No - Patient declined  No - Patient declined   Copy of HM[place]in Chart? Yes - validated most recent copy scanned in chart (See row information) Yes - validated most recent copy scanned in chart (See row information)  Yes - validated most recent copy scanned in chart (See row information) Yes - validated most recent copy scanned in chart (See row information) Yes - validated most recent copy scanned in chart (See row information) No - copy requested  Would patient like information on creating a medical advance directive?       No - Patient declined    Current Medications (verified) Outpatient Encounter Medications as of 06/30/2022  Medication Sig   ACCU-CHEK GUIDE test strip USE AS  DIRECTED TO TEST BLOOD GLUCOSE LEVELS TWICE DAILY   ALPRAZolam (XANAX) 0.25 MG tablet TAKE 1 TABLET BY MOUTH ONCE DAILY AS NEEDED.   amLODipine (NORVASC) 10 MG tablet Take 1 tablet (10 mg total) by mouth daily.   Ascorbic Acid (VITAMIN C) 100 MG tablet Take 100 mg by mouth in the morning.   aspirin EC 81 MG tablet Take 81 mg by mouth every evening. Swallow whole.   atorvastatin (LIPITOR) 20 MG tablet TAKE 1 TABLET BY MOUTH ONCE EVERY EVENING   Azelastine-Fluticasone 137-50 MCG/ACT SUSP PLACE 1 SPRAY INTO EACH NOSTRIL ONCE EVERY MORNING AND AT BEDTIME   blood glucose meter kit and supplies Dispense based on patient and insurance preference. Use up to four times daily as directed. (FOR ICD-10 E10.9, E11.9).   carvedilol (COREG) 25 MG tablet TAKE 1 TABLET BY MOUTH TWICE DAILY WITH MEALS   Dulaglutide (TRULICITY) 1.5 M0000000SOPN Inject 1.5 mg into the skin once a week.   levocetirizine (XYZAL) 5 MG tablet TAKE 1 TABLET BY MOUTH ONCE EVERY EVENING   lisinopril (ZESTRIL) 40 MG tablet TAKE 1 TABLET BY MOUTH ONCE DAILY   meloxicam (MOBIC) 15 MG tablet Take 15 mg by mouth daily.   metFORMIN (GLUCOPHAGE) 1000 MG tablet Take 1 tablet (1,000 mg total) by mouth 2 (two) times daily with a meal.   montelukast (SINGULAIR) 10 MG tablet TAKE 1 TABLET BY MOUTH AT BEDTIME   Multiple Vitamin (MULTIVITAMIN WITH  MINERALS) TABS tablet Take 1 tablet by mouth in the morning.   pantoprazole (PROTONIX) 40 MG tablet Take 1 tablet (40 mg total) by mouth daily.   pioglitazone (ACTOS) 15 MG tablet Take 1 tablet (15 mg total) by mouth daily.   sertraline (ZOLOFT) 100 MG tablet Take 1 tablet (100 mg total) by mouth daily.   tamsulosin (FLOMAX) 0.4 MG CAPS capsule Take 2 capsules (0.8 mg total) by mouth daily after supper.   testosterone cypionate (DEPOTESTOSTERONE CYPIONATE) 200 MG/ML injection Inject 1 mL (200 mg total) into the muscle every 14 (fourteen) days.   traZODone (DESYREL) 50 MG tablet TAKE 1 and 1/2 TABLETS BY  MOUTH AT BEDTIME   triamterene-hydrochlorothiazide (MAXZIDE-25) 37.5-25 MG tablet Take 1 tablet by mouth daily.   No facility-administered encounter medications on file as of 06/30/2022.    Allergies (verified) Penicillins   History: Past Medical History:  Diagnosis Date   C. difficile colitis    Coronary artery disease    Depression    Diabetes mellitus ([place])    Diverticulitis    Diverticulosis    Dysrhythmia    PAF   Environmental allergies    GERD (gastroesophageal reflux disease)    Goiter    intrathoracic, s/p benign biopsy (Dr Harlow Asa)   Hypercholesterolemia    Hypertension    Osteoarthritis    cervical spine, lumbar spine   Paroxysmal atrial fibrillation (HCC)    Pneumonia    PONV (postoperative nausea and vomiting)    Sleep apnea    CPAP @ NIGHT   Stroke [facility])    TIA (transient ischemic attack)    Past Surgical History:  Procedure Laterality Date   BACK SURGERY  8/08   s/p fusion of L4-L5   capsule endoscopy     CATARACT EXTRACTION  2011   Dr. Herbert Deaner   CERVICAL DISC SURGERY  2002   COLONOSCOPY WITH PROPOFOL N/A 07/20/2017   Procedure: COLONOSCOPY WITH PROPOFOL;  Surgeon: Manya Silvas, MD;  [facility] ENDOSCOPY;  Service: Endoscopy;  Laterality: N/A;   EYE SURGERY     cataract bilateral 10/1999   eyelid reduction     Dr. Kerby Less HERNIA REPAIR  1991   Dr, Cassell Clement LAMINECTOMY/DECOMPRESSION MICRODISCECTOMY N/A 10/15/2020   Procedure: Laminectomy - Lumbar Two-Lumbar Three - Lumbar Three-Lumbar Four with sublaminar decompression;  Surgeon: Eustace Moore, MD;  Location: [facility];  Service: Neurosurgery;  Laterality: N/A;  Laminectomy - Lumbar Two-Lumbar Three - Lumbar Three-Lumbar Four with sublaminar decompression   NOSE SURGERY     turbinate reduction   SEPTOPLASTY  1975   SHOULDER SURGERY  2000   rotator cuff   Family History  Problem Relation Age of Onset   Congestive Heart Failure Father    Heart disease Father        myocardial  infarction   Rheumatic fever Father        valvular disease   Heart disease Mother        s/p CABG (age 67)   Kidney disease Sister    Colon cancer Neg Hx    Prostate cancer Neg Hx    Social History   Socioeconomic History   Marital status: Married    Spouse name: Not on file   Number of children: 2   Years of education: Not on file   Highest education level: Not on file  Occupational History   Occupation: retired Pharmacist, hospital  Tobacco Use   Smoking status: Former  Smokeless tobacco: Never  Vaping Use   Vaping Use: Never used  Substance and Sexual Activity   Alcohol use: Yes    Alcohol/week: 0.0 standard drinks of alcohol    Comment: occasional   Drug use: No   Sexual activity: Not Currently  Other Topics Concern   Not on file  Social History Narrative   Not on file   Social Determinants of Health   Financial Resource Strain: Low Risk  (06/30/2022)   Overall Financial Resource Strain (CARDIA)    Difficulty of Paying Living Expenses: Not hard at all  Food Insecurity: No Food Insecurity (06/30/2022)   Hunger Vital Sign    Worried About Running Out of Food in the Last Year: Never true    Ran Out of Food in the Last Year: Never true  Transportation Needs: No Transportation Needs (06/30/2022)   PRAPARE - Hydrologist (Medical): No    Lack of Transportation (Non-Medical): No  Physical Activity: Insufficiently Active (06/30/2022)   Exercise Vital Sign    Days of Exercise per Week: 2 days    Minutes of Exercise per Session: 60 min  Stress: No Stress Concern Present (06/30/2022)   [place]    Feeling of Stress : Only a little  Social Connections: Unknown (06/30/2022)   Social Connection and Isolation Panel [NHANES]    Frequency of Communication with Friends and Family: Not on file    Frequency of Social Gatherings with Friends and Family: Not on file    Attends Religious Services:  Not on Advertising copywriter or Organizations: Not on file    Attends Archivist Meetings: Not on file    Marital Status: Married    Tobacco Counseling Counseling given: Not Answered   Clinical Intake:  Pre-visit preparation completed: Yes        Diabetes: Yes (Followed by pcp)  How often do you need to have someone help you when you read instructions, pamphlets, or other written materials from your doctor or pharmacy?: 1 - Never  Nutrition Risk Assessment: Has the patient had any N/V/D within the last 2 months?  No  Does the patient have any non-healing wounds?  No  Has the patient had any unintentional weight loss or weight gain?  No   Diabetes: Is the patient diabetic?  Yes  If diabetic, was a CBG obtained today?  Yes , FBS 129 Did the patient bring in their glucometer from home?  No  How often do you monitor your CBG's? Twice daily.   Financial Strains and Diabetes Management: Are you having any financial strains with the device, your supplies or your medication? No .  Does the patient want to be seen by Chronic Care Management for management of their diabetes?  No  Would the patient like to be referred to a Nutritionist or for Diabetic Management?  No     Interpreter Needed?: No    Activities of Daily Living    06/30/2022    1:40 PM 07/06/2021    7:30 PM  In your present state of health, do you have any difficulty performing the following activities:  Hearing? 1   Comment Hearing aids   Vision? 0   Difficulty concentrating or making decisions? 0   Walking or climbing stairs? 1   Comment Cane in use when ambulating.   Dressing or bathing? 0   Doing errands, shopping? 1 0  Comment Wife assist as needed.   Preparing Food and eating ? N   Comment Wife manages meal prep. Self feeds.   Using the Toilet? N   In the past six months, have you accidently leaked urine? N   Comment Followed by Urology and pcp.   Do you have problems with loss of  bowel control? N   Comment Followed by pcp and Gastro.   Managing your Medications? N   Managing your Finances? N   Housekeeping or managing your Housekeeping? Y   Comment Wife manages     Patient Care Team: Einar Pheasant, MD as PCP - General (Internal Medicine)  Indicate any recent Medical Services you may have received from other than [facility] providers in the past year (date may be approximate).     Assessment:   This is a routine wellness examination for [place].  I connected with  Jacarri Roiger Swallows on 06/30/22 by a audio enabled telemedicine application and verified that I am speaking with the correct person using two identifiers.  Patient Location: Home  Provider Location: Office/Clinic  I discussed the limitations of evaluation and management by telemedicine. The patient expressed understanding and agreed to proceed.   Hearing/Vision screen Hearing Screening - Comments:: Followed by [facility]  Visits as needed  Hearing aid, bilateral   Vision Screening - Comments:: Followed by Dr. Sondra Barges  Wears corrective lenses  Cataract extraction, bilateral  They have regular follow up with the ophthalmologist   Dietary issues and exercise activities discussed: Current Exercise Habits: Home exercise routine, Type of exercise: stretching;strength training/weights (Physical therapy), Time (Minutes): 60, Frequency (Times/Week): 2, Weekly Exercise (Minutes/Week): 120, Intensity: Mild Low carb diet Fair water intake   Goals Addressed               This Visit's Progress     Patient Stated     Increase physical activity (pt-stated)        Walk around more for exercise, as tolerated.  Use exercise bike.        Depression Screen    06/30/2022    1:50 PM 02/10/2022   10:11 AM 10/08/2021   10:13 AM 06/21/2021   10:42 AM 06/20/2020   10:46 AM 06/20/2019   10:15 AM 06/16/2018    9:56 AM  PHQ 2/9 Scores  PHQ - 2 Score 0 2 0 0 0 0 0  PHQ- 9 Score  10         Fall  Risk    06/30/2022    1:52 PM 02/10/2022   10:13 AM 02/10/2022   10:06 AM 10/08/2021   10:13 AM 06/21/2021   10:44 AM  Fall Risk   Falls in the past year? 0 1 1 1 $ 0  Number falls in past yr: 0 1 1 1 $ 0  Injury with Fall? 0 0 1 0   Risk for fall due to :  Impaired balance/gait Impaired balance/gait History of fall(s);Impaired balance/gait;Impaired mobility   Follow up Falls evaluation completed;Falls prevention discussed Falls evaluation completed Falls evaluation completed Falls evaluation completed Falls evaluation completed    FALL RISK PREVENTION PERTAINING TO THE HOME: Home free of loose throw rugs in walkways, pet beds, electrical cords, etc? Yes  Adequate lighting in your home to reduce risk of falls? Yes   ASSISTIVE DEVICES UTILIZED TO PREVENT FALLS: Life alert? No  Use of a cane, walker or w/c? Yes  Grab bars in the bathroom? Yes  Shower chair or bench in shower? Yes  Comfort chair height toilet? Yes   TIMED UP AND GO: Was the test performed? No .   Cognitive Function:    06/05/2016    9:28 AM  MMSE - Mini Mental State Exam  Orientation to time 5  Orientation to Place 5  Registration 3  Attention/ Calculation 5  Recall 3  Language- name 2 objects 2  Language- repeat 1  Language- follow 3 step command 3  Language- read & follow direction 1  Write a sentence 1  Copy design 1  Total score 30        06/30/2022    1:47 PM 06/21/2021   10:49 AM 06/20/2019   10:17 AM 06/16/2018    9:57 AM 06/08/2017    9:47 AM  6CIT Screen  What Year? 0 points 0 points 0 points 0 points 0 points  What month? 0 points 0 points 0 points 0 points 0 points  What time? 0 points 0 points 0 points 0 points 0 points  Count back from 20 0 points   0 points 0 points  Months in reverse 0 points 0 points 0 points 0 points 0 points  Repeat phrase 0 points   0 points 0 points  Total Score 0 points   0 points 0 points    Immunizations Immunization History  Administered Date(s) Administered    Fluad Quad(high Dose 65+) 02/17/2020, 02/07/2021, 02/10/2022   Influenza Split 02/16/2012, 02/13/2014   Influenza, High Dose Seasonal PF 02/20/2016, 02/05/2017, 02/09/2018, 02/07/2019   Influenza,inj,Quad PF,6+ Mos 01/14/2013, 03/08/2015   PFIZER(Purple Top)SARS-COV-2 Vaccination 06/20/2019, 07/11/2019, 02/15/2020, 08/23/2020, 02/10/2022   Pfizer Covid-19 Vaccine Bivalent Booster 33yr & up 02/18/2021   Pneumococcal Conjugate-13 03/10/2013   Pneumococcal Polysaccharide-23 08/23/2014   Rsv, Bivalent, Protein Subunit Rsvpref,pf (Evans Lance 06/18/2022   Tdap 05/13/2011   Zoster Recombinat (Shingrix) 04/24/2017, 07/15/2017   Zoster, Live 12/08/2013   TDAP status: Due, Education has been provided regarding the importance of this vaccine. Advised may receive this vaccine at local pharmacy or Health Dept. Aware to provide a copy of the vaccination record if obtained from local pharmacy or Health Dept. Verbalized acceptance and understanding.  Screening Tests Health Maintenance  Topic Date Due   DTaP/Tdap/Td (2 - Td or Tdap) 05/12/2021   COVID-19 Vaccine (7 - 2023-24 season) 07/16/2022 (Originally 04/07/2022)   FOOT EXAM  08/03/2022   HEMOGLOBIN A1C  12/09/2022   Diabetic kidney evaluation - Urine ACR  02/07/2023   OPHTHALMOLOGY EXAM  04/30/2023   Diabetic kidney evaluation - eGFR measurement  06/11/2023   Medicare Annual Wellness (AWV)  07/01/2023   Pneumonia Vaccine 77 Years old  Completed   INFLUENZA VACCINE  Completed   Hepatitis C Screening  Completed   Zoster Vaccines- Shingrix  Completed   HPV VACCINES  Aged Out   COLONOSCOPY (Pts [phone] coverage will need to be confirmed)  Discontinued   Health Maintenance Health Maintenance Due  Topic Date Due   DTaP/Tdap/Td (2 - Td or Tdap) 05/12/2021   Hepatitis C Screening: Completed 10/02/2015.   Vision Screening: Recommended annual ophthalmology exams for early detection of glaucoma and other disorders of the eye.  Dental  Screening: Recommended annual dental exams for proper oral hygiene  Community Resource Referral / Chronic Care Management: CRR required this visit?  No   CCM required this visit?  No      Plan:     I have personally reviewed and noted the following in the patient's chart:   Medical and social history  Use of alcohol, tobacco or illicit drugs  Current medications and supplements including opioid prescriptions. Patient is not currently taking opioid prescriptions. Functional ability and status Nutritional status Physical activity Advanced directives List of other physicians Hospitalizations, surgeries, and ER visits in previous 12 months Vitals Screenings to include cognitive, depression, and falls Referrals and appointments  In addition, I have reviewed and discussed with patient certain preventive protocols, quality metrics, and best practice recommendations. A written personalized care plan for preventive services as well as general preventive health recommendations were provided to patient.     Leta Jungling, LPN   [id number]

## 2022-06-30 NOTE — Patient Instructions (Addendum)
Ricardo Nash , Thank you for taking time to come for your Medicare Wellness Visit. I appreciate your ongoing commitment to your health goals. Please review the following plan we discussed and let me know if I can assist you in the future.   These are the goals we discussed:  Goals       Patient Stated     Increase physical activity (pt-stated)      Walk around more for exercise, as tolerated.  Use exercise bike.       Other     Follow up with Primary Care Provider      As needed        This is a list of the screening recommended for you and due dates:  Health Maintenance  Topic Date Due   DTaP/Tdap/Td vaccine (2 - Td or Tdap) 05/12/2021   COVID-19 Vaccine (7 - 2023-24 season) 07/16/2022*   Complete foot exam   08/03/2022   Hemoglobin A1C  12/09/2022   Yearly kidney health urinalysis for diabetes  02/07/2023   Eye exam for diabetics  04/30/2023   Yearly kidney function blood test for diabetes  06/11/2023   Medicare Annual Wellness Visit  07/01/2023   Pneumonia Vaccine  Completed   Flu Shot  Completed   Hepatitis C Screening: USPSTF Recommendation to screen - Ages 18-79 yo.  Completed   Zoster (Shingles) Vaccine  Completed   HPV Vaccine  Aged Out   Colon Cancer Screening  Discontinued  *Topic was postponed. The date shown is not the original due date.    Advanced directives: on file.   Conditions/risks identified: none new.   Next appointment: Follow up in one year for your annual wellness visit.   Preventive Care 37 Years and Older, Male  Preventive care refers to lifestyle choices and visits with your health care provider that can promote health and wellness. What does preventive care include? A yearly physical exam. This is also called an annual well check. Dental exams once or twice a year. Routine eye exams. Ask your health care provider how often you should have your eyes checked. Personal lifestyle choices, including: Daily care of your teeth and  gums. Regular physical activity. Eating a healthy diet. Avoiding tobacco and drug use. Limiting alcohol use. Practicing safe sex. Taking low doses of aspirin every day. Taking vitamin and mineral supplements as recommended by your health care provider. What happens during an annual well check? The services and screenings done by your health care provider during your annual well check will depend on your age, overall health, lifestyle risk factors, and family history of disease. Counseling  Your health care provider may ask you questions about your: Alcohol use. Tobacco use. Drug use. Emotional well-being. Home and relationship well-being. Sexual activity. Eating habits. History of falls. Memory and ability to understand (cognition). Work and work Statistician. Screening  You may have the following tests or measurements: Height, weight, and BMI. Blood pressure. Lipid and cholesterol levels. These may be checked every 5 years, or more frequently if you are over 70 years old. Skin check. Lung cancer screening. You may have this screening every year starting at age 60 if you have a 30-pack-year history of smoking and currently smoke or have quit within the past 15 years. Fecal occult blood test (FOBT) of the stool. You may have this test every year starting at age 25. Flexible sigmoidoscopy or colonoscopy. You may have a sigmoidoscopy every 5 years or a colonoscopy every 10 years starting  at age 80. Prostate cancer screening. Recommendations will vary depending on your family history and other risks. Hepatitis C blood test. Hepatitis B blood test. Sexually transmitted disease (STD) testing. Diabetes screening. This is done by checking your blood sugar (glucose) after you have not eaten for a while (fasting). You may have this done every 1-3 years. Abdominal aortic aneurysm (AAA) screening. You may need this if you are a current or former smoker. Osteoporosis. You may be screened  starting at age 28 if you are at high risk. Talk with your health care provider about your test results, treatment options, and if necessary, the need for more tests. Vaccines  Your health care provider may recommend certain vaccines, such as: Influenza vaccine. This is recommended every year. Tetanus, diphtheria, and acellular pertussis (Tdap, Td) vaccine. You may need a Td booster every 10 years. Zoster vaccine. You may need this after age 53. Pneumococcal 13-valent conjugate (PCV13) vaccine. One dose is recommended after age 18. Pneumococcal polysaccharide (PPSV23) vaccine. One dose is recommended after age 19. Talk to your health care provider about which screenings and vaccines you need and how often you need them. This information is not intended to replace advice given to you by your health care provider. Make sure you discuss any questions you have with your health care provider. Document Released: 05/25/2015 Document Revised: 01/16/2016 Document Reviewed: 02/27/2015 Elsevier Interactive Patient Education  2017 [employer] Prevention in the Home Falls can cause injuries. They can happen to people of all ages. There are many things you can do to make your home safe and to help prevent falls. What can I do on the outside of my home? Regularly fix the edges of walkways and driveways and fix any cracks. Remove anything that might make you trip as you walk through a door, such as a raised step or threshold. Trim any bushes or trees on the path to your home. Use bright outdoor lighting. Clear any walking paths of anything that might make someone trip, such as rocks or tools. Regularly check to see if handrails are loose or broken. Make sure that both sides of any steps have handrails. Any raised decks and porches should have guardrails on the edges. Have any leaves, snow, or ice cleared regularly. Use sand or salt on walking paths during winter. Clean up any spills in your garage  right away. This includes oil or grease spills. What can I do in the bathroom? Use night lights. Install grab bars by the toilet and in the tub and shower. Do not use towel bars as grab bars. Use non-skid mats or decals in the tub or shower. If you need to sit down in the shower, use a plastic, non-slip stool. Keep the floor dry. Clean up any water that spills on the floor as soon as it happens. Remove soap buildup in the tub or shower regularly. Attach bath mats securely with double-sided non-slip rug tape. Do not have throw rugs and other things on the floor that can make you trip. What can I do in the bedroom? Use night lights. Make sure that you have a light by your bed that is easy to reach. Do not use any sheets or blankets that are too big for your bed. They should not hang down onto the floor. Have a firm chair that has side arms. You can use this for support while you get dressed. Do not have throw rugs and other things on the floor that can  make you trip. What can I do in the kitchen? Clean up any spills right away. Avoid walking on wet floors. Keep items that you use a lot in easy-to-reach places. If you need to reach something above you, use a strong step stool that has a grab bar. Keep electrical cords out of the way. Do not use floor polish or wax that makes floors slippery. If you must use wax, use non-skid floor wax. Do not have throw rugs and other things on the floor that can make you trip. What can I do with my stairs? Do not leave any items on the stairs. Make sure that there are handrails on both sides of the stairs and use them. Fix handrails that are broken or loose. Make sure that handrails are as long as the stairways. Check any carpeting to make sure that it is firmly attached to the stairs. Fix any carpet that is loose or worn. Avoid having throw rugs at the top or bottom of the stairs. If you do have throw rugs, attach them to the floor with carpet tape. Make  sure that you have a light switch at the top of the stairs and the bottom of the stairs. If you do not have them, ask someone to add them for you. What else can I do to help prevent falls? Wear shoes that: Do not have high heels. Have rubber bottoms. Are comfortable and fit you well. Are closed at the toe. Do not wear sandals. If you use a stepladder: Make sure that it is fully opened. Do not climb a closed stepladder. Make sure that both sides of the stepladder are locked into place. Ask someone to hold it for you, if possible. Clearly mark and make sure that you can see: Any grab bars or handrails. First and last steps. Where the edge of each step is. Use tools that help you move around (mobility aids) if they are needed. These include: Canes. Walkers. Scooters. Crutches. Turn on the lights when you go into a dark area. Replace any light bulbs as soon as they burn out. Set up your furniture so you have a clear path. Avoid moving your furniture around. If any of your floors are uneven, fix them. If there are any pets around you, be aware of where they are. Review your medicines with your doctor. Some medicines can make you feel dizzy. This can increase your chance of falling. Ask your doctor what other things that you can do to help prevent falls. This information is not intended to replace advice given to you by your health care provider. Make sure you discuss any questions you have with your health care provider. Document Released: 02/22/2009 Document Revised: 10/04/2015 Document Reviewed: 06/02/2014 Elsevier Interactive Patient Education  2017 [employer].

## 2022-07-01 DIAGNOSIS — G4733 Obstructive sleep apnea (adult) (pediatric): Secondary | ICD-10-CM | POA: Diagnosis not present

## 2022-07-02 DIAGNOSIS — M546 Pain in thoracic spine: Secondary | ICD-10-CM | POA: Diagnosis not present

## 2022-07-02 DIAGNOSIS — M545 Low back pain, unspecified: Secondary | ICD-10-CM | POA: Diagnosis not present

## 2022-07-02 DIAGNOSIS — M5459 Other low back pain: Secondary | ICD-10-CM | POA: Diagnosis not present

## 2022-07-02 DIAGNOSIS — M6281 Muscle weakness (generalized): Secondary | ICD-10-CM | POA: Diagnosis not present

## 2022-07-02 DIAGNOSIS — M533 Sacrococcygeal disorders, not elsewhere classified: Secondary | ICD-10-CM | POA: Diagnosis not present

## 2022-07-02 DIAGNOSIS — R2689 Other abnormalities of gait and mobility: Secondary | ICD-10-CM | POA: Diagnosis not present

## 2022-07-02 DIAGNOSIS — M25559 Pain in unspecified hip: Secondary | ICD-10-CM | POA: Diagnosis not present

## 2022-07-02 DIAGNOSIS — M5416 Radiculopathy, lumbar region: Secondary | ICD-10-CM | POA: Diagnosis not present

## 2022-07-04 ENCOUNTER — Telehealth: Payer: Self-pay | Admitting: Internal Medicine

## 2022-07-04 ENCOUNTER — Telehealth: Payer: Self-pay

## 2022-07-04 NOTE — Telephone Encounter (Signed)
Pt need a refill on xanax sent to [facility]

## 2022-07-04 NOTE — Telephone Encounter (Signed)
Sent to doc of the day for signature.

## 2022-07-04 NOTE — Telephone Encounter (Signed)
I am going to defer this to the patient's PCP as I can't determine if he is should still be on this during chart review.

## 2022-07-06 MED ORDER — ALPRAZOLAM 0.25 MG PO TABS: 0.2500 mg | ORAL_TABLET | Freq: Every day | ORAL | 0 refills | Status: DC | PRN

## 2022-07-07 DIAGNOSIS — R2689 Other abnormalities of gait and mobility: Secondary | ICD-10-CM | POA: Diagnosis not present

## 2022-07-07 DIAGNOSIS — M5459 Other low back pain: Secondary | ICD-10-CM | POA: Diagnosis not present

## 2022-07-07 DIAGNOSIS — M6281 Muscle weakness (generalized): Secondary | ICD-10-CM | POA: Diagnosis not present

## 2022-07-07 NOTE — Telephone Encounter (Signed)
Pt wife advised xanax was sent per record

## 2022-07-07 NOTE — Telephone Encounter (Signed)
Attempted to call [facility] twice - unable to get through

## 2022-07-07 NOTE — Telephone Encounter (Signed)
Pharmacy called about XANAX refill for pt

## 2022-07-08 NOTE — Telephone Encounter (Signed)
Pt wife called stating the medication is not at the pharmacy and this is another fax number to send it to  [phone]

## 2022-07-08 NOTE — Telephone Encounter (Signed)
Rx called into [facility]  Pt wife advised

## 2022-07-09 DIAGNOSIS — M5416 Radiculopathy, lumbar region: Secondary | ICD-10-CM | POA: Diagnosis not present

## 2022-07-09 DIAGNOSIS — M546 Pain in thoracic spine: Secondary | ICD-10-CM | POA: Diagnosis not present

## 2022-07-14 DIAGNOSIS — M6281 Muscle weakness (generalized): Secondary | ICD-10-CM | POA: Diagnosis not present

## 2022-07-14 DIAGNOSIS — R2689 Other abnormalities of gait and mobility: Secondary | ICD-10-CM | POA: Diagnosis not present

## 2022-07-14 DIAGNOSIS — M5459 Other low back pain: Secondary | ICD-10-CM | POA: Diagnosis not present

## 2022-07-16 DIAGNOSIS — M6281 Muscle weakness (generalized): Secondary | ICD-10-CM | POA: Diagnosis not present

## 2022-07-16 DIAGNOSIS — R2689 Other abnormalities of gait and mobility: Secondary | ICD-10-CM | POA: Diagnosis not present

## 2022-07-16 DIAGNOSIS — M5459 Other low back pain: Secondary | ICD-10-CM | POA: Diagnosis not present

## 2022-07-21 DIAGNOSIS — R2689 Other abnormalities of gait and mobility: Secondary | ICD-10-CM | POA: Diagnosis not present

## 2022-07-21 DIAGNOSIS — M6281 Muscle weakness (generalized): Secondary | ICD-10-CM | POA: Diagnosis not present

## 2022-07-21 DIAGNOSIS — M5459 Other low back pain: Secondary | ICD-10-CM | POA: Diagnosis not present

## 2022-07-26 ENCOUNTER — Encounter: Payer: Self-pay | Admitting: Internal Medicine

## 2022-07-28 DIAGNOSIS — M5459 Other low back pain: Secondary | ICD-10-CM | POA: Diagnosis not present

## 2022-07-28 DIAGNOSIS — R2689 Other abnormalities of gait and mobility: Secondary | ICD-10-CM | POA: Diagnosis not present

## 2022-07-28 DIAGNOSIS — M6281 Muscle weakness (generalized): Secondary | ICD-10-CM | POA: Diagnosis not present

## 2022-07-28 NOTE — Telephone Encounter (Signed)
Given blood sugars have been up recently and given last A1c > 7.0, can increase trulicity to 3mg  q week.

## 2022-07-28 NOTE — Telephone Encounter (Signed)
Trulicity 1.5 is on back order. Next shipment will be early April per [facility] pharmacist. They are able to get the .75 and the 3 mg as well.

## 2022-07-28 NOTE — Telephone Encounter (Addendum)
Pharmacy called stating they is a [facility] of Trulicity 1.5 mg and what to know if the provider can change it .75 or 3 mg

## 2022-07-29 MED ORDER — TRULICITY 3 MG/0.5ML ~~LOC~~ SOAJ: 3.0000 mg | SUBCUTANEOUS | 1 refills | Status: DC

## 2022-07-29 NOTE — Telephone Encounter (Signed)
I called patient's wife, and in the course of conversation, patient states he wanted to follow-up on his previous MyChart message.  I read Dr. Randell Patient Scott's message to patient.  Patient states he would like for Dr. Nicki Reaper to please call this in to his [facility] in [place].

## 2022-07-30 DIAGNOSIS — M6281 Muscle weakness (generalized): Secondary | ICD-10-CM | POA: Diagnosis not present

## 2022-07-30 DIAGNOSIS — M5459 Other low back pain: Secondary | ICD-10-CM | POA: Diagnosis not present

## 2022-07-30 DIAGNOSIS — R2689 Other abnormalities of gait and mobility: Secondary | ICD-10-CM | POA: Diagnosis not present

## 2022-07-30 DIAGNOSIS — G4733 Obstructive sleep apnea (adult) (pediatric): Secondary | ICD-10-CM | POA: Diagnosis not present

## 2022-08-04 DIAGNOSIS — M5459 Other low back pain: Secondary | ICD-10-CM | POA: Diagnosis not present

## 2022-08-04 DIAGNOSIS — R2689 Other abnormalities of gait and mobility: Secondary | ICD-10-CM | POA: Diagnosis not present

## 2022-08-04 DIAGNOSIS — M6281 Muscle weakness (generalized): Secondary | ICD-10-CM | POA: Diagnosis not present

## 2022-08-11 DIAGNOSIS — M6281 Muscle weakness (generalized): Secondary | ICD-10-CM | POA: Diagnosis not present

## 2022-08-11 DIAGNOSIS — M5459 Other low back pain: Secondary | ICD-10-CM | POA: Diagnosis not present

## 2022-08-11 DIAGNOSIS — R2689 Other abnormalities of gait and mobility: Secondary | ICD-10-CM | POA: Diagnosis not present

## 2022-08-13 DIAGNOSIS — M5416 Radiculopathy, lumbar region: Secondary | ICD-10-CM | POA: Diagnosis not present

## 2022-08-14 DIAGNOSIS — M5416 Radiculopathy, lumbar region: Secondary | ICD-10-CM | POA: Diagnosis not present

## 2022-08-14 DIAGNOSIS — M5136 Other intervertebral disc degeneration, lumbar region: Secondary | ICD-10-CM | POA: Diagnosis not present

## 2022-08-14 DIAGNOSIS — M961 Postlaminectomy syndrome, not elsewhere classified: Secondary | ICD-10-CM | POA: Diagnosis not present

## 2022-08-14 DIAGNOSIS — M545 Low back pain, unspecified: Secondary | ICD-10-CM | POA: Diagnosis not present

## 2022-08-20 ENCOUNTER — Other Ambulatory Visit (HOSPITAL_COMMUNITY): Payer: Self-pay

## 2022-08-28 ENCOUNTER — Other Ambulatory Visit: Payer: Medicare PPO

## 2022-08-28 DIAGNOSIS — N138 Other obstructive and reflux uropathy: Secondary | ICD-10-CM

## 2022-08-28 DIAGNOSIS — N401 Enlarged prostate with lower urinary tract symptoms: Secondary | ICD-10-CM | POA: Diagnosis not present

## 2022-08-28 DIAGNOSIS — R32 Unspecified urinary incontinence: Secondary | ICD-10-CM | POA: Diagnosis not present

## 2022-08-29 ENCOUNTER — Other Ambulatory Visit: Payer: Self-pay

## 2022-08-29 DIAGNOSIS — E291 Testicular hypofunction: Secondary | ICD-10-CM

## 2022-08-29 LAB — TESTOSTERONE: Testosterone: 124 ng/dL — ABNORMAL LOW (ref 264–916)

## 2022-08-29 LAB — HEMOGLOBIN AND HEMATOCRIT, BLOOD
Hematocrit: 41.3 % (ref 37.5–51.0)
Hemoglobin: 13 g/dL (ref 13.0–17.7)

## 2022-08-30 DIAGNOSIS — G4733 Obstructive sleep apnea (adult) (pediatric): Secondary | ICD-10-CM | POA: Diagnosis not present

## 2022-09-02 DIAGNOSIS — G4733 Obstructive sleep apnea (adult) (pediatric): Secondary | ICD-10-CM | POA: Diagnosis not present

## 2022-09-03 ENCOUNTER — Other Ambulatory Visit: Payer: Self-pay | Admitting: Urology

## 2022-09-12 DIAGNOSIS — M961 Postlaminectomy syndrome, not elsewhere classified: Secondary | ICD-10-CM | POA: Diagnosis not present

## 2022-09-12 DIAGNOSIS — E119 Type 2 diabetes mellitus without complications: Secondary | ICD-10-CM | POA: Diagnosis not present

## 2022-09-16 ENCOUNTER — Telehealth: Payer: Self-pay | Admitting: Internal Medicine

## 2022-09-16 ENCOUNTER — Other Ambulatory Visit: Payer: Self-pay

## 2022-09-16 MED ORDER — TRULICITY 3 MG/0.5ML ~~LOC~~ SOAJ: 3.0000 mg | SUBCUTANEOUS | 1 refills | Status: DC

## 2022-09-16 NOTE — Telephone Encounter (Signed)
Pt aware.

## 2022-09-16 NOTE — Telephone Encounter (Signed)
Prescription Request  09/16/2022  LOV: 06/13/2022  What is the name of the medication or equipment? Dulaglutide (TRULICITY) 3 MG/0.5ML SOPN. Patient is requesting a 90 day supply. Patient is doing well on new dosage.  Have you contacted your pharmacy to request a refill? No   Which pharmacy would you like this sent to?  [facility] - [place], [place] - [address] [place] [address] Phone: [phone] Fax: [phone]    Patient notified that their request is being sent to the clinical staff for review and that they should receive a response within 2 business days.   Please advise at [facility] [phone]

## 2022-09-16 NOTE — Telephone Encounter (Signed)
Refill sent to [facility]

## 2022-09-18 DIAGNOSIS — Z5181 Encounter for therapeutic drug level monitoring: Secondary | ICD-10-CM | POA: Diagnosis not present

## 2022-09-18 DIAGNOSIS — Z79899 Other long term (current) drug therapy: Secondary | ICD-10-CM | POA: Diagnosis not present

## 2022-09-29 DIAGNOSIS — G4733 Obstructive sleep apnea (adult) (pediatric): Secondary | ICD-10-CM | POA: Diagnosis not present

## 2022-10-02 DIAGNOSIS — M5416 Radiculopathy, lumbar region: Secondary | ICD-10-CM | POA: Diagnosis not present

## 2022-10-08 ENCOUNTER — Other Ambulatory Visit (INDEPENDENT_AMBULATORY_CARE_PROVIDER_SITE_OTHER): Payer: Medicare PPO

## 2022-10-08 DIAGNOSIS — I10 Essential (primary) hypertension: Secondary | ICD-10-CM

## 2022-10-08 DIAGNOSIS — E291 Testicular hypofunction: Secondary | ICD-10-CM | POA: Diagnosis not present

## 2022-10-08 DIAGNOSIS — E1159 Type 2 diabetes mellitus with other circulatory complications: Secondary | ICD-10-CM

## 2022-10-08 DIAGNOSIS — Z7985 Long-term (current) use of injectable non-insulin antidiabetic drugs: Secondary | ICD-10-CM

## 2022-10-08 DIAGNOSIS — E7800 Pure hypercholesterolemia, unspecified: Secondary | ICD-10-CM | POA: Diagnosis not present

## 2022-10-08 LAB — HEPATIC FUNCTION PANEL
ALT: 40 U/L (ref 0–53)
AST: 28 U/L (ref 0–37)
Albumin: 4.1 g/dL (ref 3.5–5.2)
Alkaline Phosphatase: 65 U/L (ref 39–117)
Bilirubin, Direct: 0.2 mg/dL (ref 0.0–0.3)
Total Bilirubin: 0.8 mg/dL (ref 0.2–1.2)
Total Protein: 7.3 g/dL (ref 6.0–8.3)

## 2022-10-08 LAB — LIPID PANEL
Cholesterol: 131 mg/dL (ref 0–200)
HDL: 33.3 mg/dL — ABNORMAL LOW (ref >39.00)
NonHDL: 98.04
Total CHOL/HDL Ratio: 4
Triglycerides: 266 mg/dL — ABNORMAL HIGH (ref 0.0–149.0)
VLDL: 53.2 mg/dL — ABNORMAL HIGH (ref 0.0–40.0)

## 2022-10-08 LAB — CBC WITH DIFFERENTIAL/PLATELET
Basophils Absolute: 0.1 10*3/uL (ref 0.0–0.1)
Basophils Relative: 0.8 % (ref 0.0–3.0)
Eosinophils Absolute: 0.2 10*3/uL (ref 0.0–0.7)
Eosinophils Relative: 2.3 % (ref 0.0–5.0)
HCT: 41.7 % (ref 39.0–52.0)
Hemoglobin: 13.6 g/dL (ref 13.0–17.0)
Lymphocytes Relative: 17 % (ref 12.0–46.0)
Lymphs Abs: 1.5 10*3/uL (ref 0.7–4.0)
MCHC: 32.6 g/dL (ref 30.0–36.0)
MCV: 88.1 fl (ref 78.0–100.0)
Monocytes Absolute: 0.5 10*3/uL (ref 0.1–1.0)
Monocytes Relative: 5.9 % (ref 3.0–12.0)
Neutro Abs: 6.4 10*3/uL (ref 1.4–7.7)
Neutrophils Relative %: 74 % (ref 43.0–77.0)
Platelets: 275 10*3/uL (ref 150.0–400.0)
RBC: 4.73 Mil/uL (ref 4.22–5.81)
RDW: 14.6 % (ref 11.5–15.5)
WBC: 8.6 10*3/uL (ref 4.0–10.5)

## 2022-10-08 LAB — BASIC METABOLIC PANEL
BUN: 18 mg/dL (ref 6–23)
CO2: 30 mEq/L (ref 19–32)
Calcium: 9.8 mg/dL (ref 8.4–10.5)
Chloride: 98 mEq/L (ref 96–112)
Creatinine, Ser: 1.24 mg/dL (ref 0.40–1.50)
GFR: 55.75 mL/min — ABNORMAL LOW (ref >60.00)
Glucose, Bld: 146 mg/dL — ABNORMAL HIGH (ref 70–99)
Potassium: 4.5 mEq/L (ref 3.5–5.1)
Sodium: 137 mEq/L (ref 135–145)

## 2022-10-08 LAB — LDL CHOLESTEROL, DIRECT: Direct LDL: 72 mg/dL

## 2022-10-08 LAB — TSH: TSH: 0.89 u[IU]/mL (ref 0.35–5.50)

## 2022-10-08 LAB — HEMOGLOBIN A1C: Hgb A1c MFr Bld: 7.1 % — ABNORMAL HIGH (ref 4.6–6.5)

## 2022-10-09 LAB — HEMOGLOBIN AND HEMATOCRIT, BLOOD
Hematocrit: 43.8 % (ref 37.5–51.0)
Hemoglobin: 14.1 g/dL (ref 13.0–17.7)

## 2022-10-09 LAB — TESTOSTERONE: Testosterone: 437 ng/dL (ref 264–916)

## 2022-10-13 ENCOUNTER — Encounter: Payer: Self-pay | Admitting: Internal Medicine

## 2022-10-13 ENCOUNTER — Ambulatory Visit: Payer: Medicare PPO | Admitting: Internal Medicine

## 2022-10-13 VITALS — BP 128/72 | HR 82 | Temp 98.2°F | Resp 16 | Ht 69.0 in | Wt 193.6 lb

## 2022-10-13 DIAGNOSIS — K219 Gastro-esophageal reflux disease without esophagitis: Secondary | ICD-10-CM

## 2022-10-13 DIAGNOSIS — F419 Anxiety disorder, unspecified: Secondary | ICD-10-CM | POA: Diagnosis not present

## 2022-10-13 DIAGNOSIS — N401 Enlarged prostate with lower urinary tract symptoms: Secondary | ICD-10-CM

## 2022-10-13 DIAGNOSIS — E1159 Type 2 diabetes mellitus with other circulatory complications: Secondary | ICD-10-CM | POA: Diagnosis not present

## 2022-10-13 DIAGNOSIS — M545 Low back pain, unspecified: Secondary | ICD-10-CM | POA: Diagnosis not present

## 2022-10-13 DIAGNOSIS — F439 Reaction to severe stress, unspecified: Secondary | ICD-10-CM

## 2022-10-13 DIAGNOSIS — I2510 Atherosclerotic heart disease of native coronary artery without angina pectoris: Secondary | ICD-10-CM

## 2022-10-13 DIAGNOSIS — E041 Nontoxic single thyroid nodule: Secondary | ICD-10-CM

## 2022-10-13 DIAGNOSIS — I480 Paroxysmal atrial fibrillation: Secondary | ICD-10-CM

## 2022-10-13 DIAGNOSIS — D509 Iron deficiency anemia, unspecified: Secondary | ICD-10-CM | POA: Diagnosis not present

## 2022-10-13 DIAGNOSIS — G4733 Obstructive sleep apnea (adult) (pediatric): Secondary | ICD-10-CM

## 2022-10-13 DIAGNOSIS — I700 Atherosclerosis of aorta: Secondary | ICD-10-CM

## 2022-10-13 DIAGNOSIS — I10 Essential (primary) hypertension: Secondary | ICD-10-CM

## 2022-10-13 DIAGNOSIS — E7800 Pure hypercholesterolemia, unspecified: Secondary | ICD-10-CM | POA: Diagnosis not present

## 2022-10-13 DIAGNOSIS — K5900 Constipation, unspecified: Secondary | ICD-10-CM

## 2022-10-13 NOTE — Progress Notes (Signed)
Subjective:    Patient ID: Ricardo Nash, male    DOB: 18-Nov-1945, 77 y.o.   MRN: [id number]  Patient here for  Chief Complaint  Patient presents with   Medical Management of Chronic Issues    HPI Here to follow up regarding hypercholesterolemia, diabetes and hypertension.  He is accompanied by his wife.  History obtained from both of them.  Main concern is continued back pain.  Increased pain - back and legs.  Saw neurosurgery (Dr Claudette Laws)  10/02/22 - continued back pain.  Recommended spinal cord stimulator.  Has oxycodone to take prn.  Tried on lyrica. Off now.  Intolerance.  Followed by ortho.  Some bloating and constipation.  Miralax/stool softender. Some decreased appetite.  Some increased stress related to above.     Past Medical History:  Diagnosis Date   C. difficile colitis    Coronary artery disease    Depression    Diabetes mellitus (HCC)    Diverticulitis    Diverticulosis    Dysrhythmia    PAF   Environmental allergies    GERD (gastroesophageal reflux disease)    Goiter    intrathoracic, s/p benign biopsy (Dr Gerrit Friends)   Hypercholesterolemia    Hypertension    Osteoarthritis    cervical spine, lumbar spine   Paroxysmal atrial fibrillation (HCC)    Pneumonia    PONV (postoperative nausea and vomiting)    Sleep apnea    CPAP @ N[facility]   Stroke [facility])    TIA (transient ischemic attack)    Past Surgical History:  Procedure Laterality Date   BACK SURGERY  8/08   s/p fusion of L4-L5   capsule endoscopy     CATARACT EXTRACTION  2011   Dr. Elmer Picker   CERVICAL DISC SURGERY  2002   COLONOSCOPY WITH PROPOFOL N/A 07/20/2017   Procedure: COLONOSCOPY WITH PROPOFOL;  Surgeon: Scot Jun, MD;  Location: [facility] ENDOSCOPY;  Service: Endoscopy;  Laterality: N/A;   EYE SURGERY     cataract bilateral 10/1999   eyelid reduction     Dr. Beryle Flock HERNIA REPAIR  1991   Dr, Tobie Poet LAMINECTOMY/DECOMPRESSION MICRODISCECTOMY N/A 10/15/2020   Procedure:  Laminectomy - Lumbar Two-Lumbar Three - Lumbar Three-Lumbar Four with sublaminar decompression;  Surgeon: Tia Alert, MD;  Location: [facility] OR;  Service: Neurosurgery;  Laterality: N/A;  Laminectomy - Lumbar Two-Lumbar Three - Lumbar Three-Lumbar Four with sublaminar decompression   NOSE SURGERY     turbinate reduction   SEPTOPLASTY  1975   SHOULDER SURGERY  2000   rotator cuff   Family History  Problem Relation Age of Onset   Congestive Heart Failure Father    Heart disease Father        myocardial infarction   Rheumatic fever Father        valvular disease   Heart disease Mother        s/p CABG (age 47)   Kidney disease Sister    Colon cancer Neg Hx    Prostate cancer Neg Hx    Social History   Socioeconomic History   Marital status: Married    Spouse name: Not on file   Number of children: 2   Years of education: Not on file   Highest education level: Not on file  Occupational History   Occupation: retired Runner, broadcasting/film/video  Tobacco Use   Smoking status: Former   Smokeless tobacco: Never  Building services engineer Use: Never used  Substance and Sexual Activity   Alcohol use: Yes    Alcohol/week: 0.0 standard drinks of alcohol    Comment: occasional   Drug use: No   Sexual activity: Not Currently  Other Topics Concern   Not on file  Social History Narrative   Not on file   Social Determinants of Health   Financial Resource Strain: Low Risk  (06/30/2022)   Overall Financial Resource Strain (CARDIA)    Difficulty of Paying Living Expenses: Not hard at all  Food Insecurity: No Food Insecurity (06/30/2022)   Hunger Vital Sign    Worried About Running Out of Food in the Last Year: Never true    Ran Out of Food in the Last Year: Never true  Transportation Needs: No Transportation Needs (06/30/2022)   PRAPARE - Administrator, Civil Service (Medical): No    Lack of Transportation (Non-Medical): No  Physical Activity: Insufficiently Active (06/30/2022)   Exercise Vital  Sign    Days of Exercise per Week: 2 days    Minutes of Exercise per Session: 60 min  Stress: No Stress Concern Present (06/30/2022)   [employer] of Occupational Health - Occupational Stress Questionnaire    Feeling of Stress : Only a little  Social Connections: Unknown (06/30/2022)   Social Connection and Isolation Panel [NHANES]    Frequency of Communication with Friends and Family: Not on file    Frequency of Social Gatherings with Friends and Family: Not on file    Attends Religious Services: Not on file    Active Member of Clubs or Organizations: Not on file    Attends Banker Meetings: Not on file    Marital Status: Married     Review of Systems  Constitutional:  Negative for fever.       Decreased appetite. Weight is down some from last check.   HENT:  Negative for congestion and sinus pressure.   Respiratory:  Negative for cough, chest tightness and shortness of breath.   Cardiovascular:  Negative for chest pain, palpitations and leg swelling.  Gastrointestinal:  Positive for constipation. Negative for diarrhea, nausea and vomiting.       Abdominal bloating.   Genitourinary:  Negative for difficulty urinating and dysuria.  Musculoskeletal:  Positive for back pain.  Skin:  Negative for color change and rash.  Neurological:  Negative for dizziness and headaches.  Psychiatric/Behavioral:  Negative for agitation and dysphoric mood.        Increased stress as outlined.        Objective:     BP 128/72   Pulse 82   Temp 98.2 F (36.8 C)   Resp 16   Ht 5\' 9"  (1.753 m)   Wt 193 lb 9.6 oz (87.8 kg)   SpO2 97%   BMI 28.59 kg/m  Wt Readings from Last 3 Encounters:  10/13/22 193 lb 9.6 oz (87.8 kg)  06/30/22 198 lb (89.8 kg)  06/13/22 198 lb (89.8 kg)    Physical Exam Vitals reviewed.  Constitutional:      General: He is not in acute distress.    Appearance: Normal appearance. He is well-developed.  HENT:     Head: Normocephalic and atraumatic.      Right Ear: External ear normal.     Left Ear: External ear normal.  Eyes:     General: No scleral icterus.       Right eye: No discharge.        Left eye: No discharge.  Conjunctiva/sclera: Conjunctivae normal.  Cardiovascular:     Rate and Rhythm: Normal rate and regular rhythm.  Pulmonary:     Effort: Pulmonary effort is normal. No respiratory distress.     Breath sounds: Normal breath sounds.  Abdominal:     General: Bowel sounds are normal.     Palpations: Abdomen is soft.     Tenderness: There is no abdominal tenderness.  Musculoskeletal:        General: No swelling or tenderness.     Cervical back: Neck supple. No tenderness.  Lymphadenopathy:     Cervical: No cervical adenopathy.  Skin:    Findings: No erythema or rash.  Neurological:     Mental Status: He is alert.  Psychiatric:        Mood and Affect: Mood normal.        Behavior: Behavior normal.      Outpatient Encounter Medications as of 10/13/2022  Medication Sig   oxyCODONE (OXY IR/ROXICODONE) 5 MG immediate release tablet Take 5 mg by mouth 3 (three) times daily as needed.   pregabalin (LYRICA) 100 MG capsule Take 100 mg by mouth 2 (two) times daily.   ACCU-CHEK GUIDE test strip USE AS DIRECTED TO TEST BLOOD GLUCOSE LEVELS TWICE DAILY   ALPRAZolam (XANAX) 0.25 MG tablet Take 1 tablet (0.25 mg total) by mouth daily as needed.   amLODipine (NORVASC) 10 MG tablet Take 1 tablet (10 mg total) by mouth daily.   Ascorbic Acid (VITAMIN C) 100 MG tablet Take 100 mg by mouth in the morning.   aspirin EC 81 MG tablet Take 81 mg by mouth every evening. Swallow whole.   atorvastatin (LIPITOR) 20 MG tablet TAKE 1 TABLET BY MOUTH ONCE EVERY EVENING   Azelastine-Fluticasone 137-50 MCG/ACT SUSP PLACE 1 SPRAY INTO EACH NOSTRIL ONCE EVERY MORNING AND AT BEDTIME   blood glucose meter kit and supplies Dispense based on patient and insurance preference. Use up to four times daily as directed. (FOR ICD-10 E10.9, E11.9).    carvedilol (COREG) 25 MG tablet TAKE 1 TABLET BY MOUTH TWICE DAILY WITH MEALS   Dulaglutide (TRULICITY) 3 MG/0.5ML SOPN Inject 3 mg as directed once a week.   levocetirizine (XYZAL) 5 MG tablet TAKE 1 TABLET BY MOUTH ONCE EVERY EVENING   lisinopril (ZESTRIL) 40 MG tablet TAKE 1 TABLET BY MOUTH ONCE DAILY   meloxicam (MOBIC) 15 MG tablet Take 15 mg by mouth daily.   metFORMIN (GLUCOPHAGE) 1000 MG tablet Take 1 tablet (1,000 mg total) by mouth 2 (two) times daily with a meal.   montelukast (SINGULAIR) 10 MG tablet TAKE 1 TABLET BY MOUTH AT BEDTIME   Multiple Vitamin (MULTIVITAMIN WITH MINERALS) TABS tablet Take 1 tablet by mouth in the morning.   pantoprazole (PROTONIX) 40 MG tablet Take 1 tablet (40 mg total) by mouth daily.   pioglitazone (ACTOS) 15 MG tablet Take 1 tablet (15 mg total) by mouth daily.   sertraline (ZOLOFT) 100 MG tablet Take 1 tablet (100 mg total) by mouth daily.   tamsulosin (FLOMAX) 0.4 MG CAPS capsule Take 2 capsules (0.8 mg total) by mouth daily after supper.   testosterone cypionate (DEPOTESTOSTERONE CYPIONATE) 200 MG/ML injection INJECT INTRAMUSCULARLY EVERY 14 DAYS   traZODone (DESYREL) 50 MG tablet TAKE 1 and 1/2 TABLETS BY MOUTH AT BEDTIME   triamterene-hydrochlorothiazide (MAXZIDE-25) 37.5-25 MG tablet Take 1 tablet by mouth daily.   No facility-administered encounter medications on file as of 10/13/2022.     Lab Results  Component Value  Date   WBC 8.6 10/08/2022   HGB 14.1 10/08/2022   HGB 13.6 10/08/2022   HCT 43.8 10/08/2022   HCT 41.7 10/08/2022   PLT 275.0 10/08/2022   GLUCOSE 146 (H) 10/08/2022   CHOL 131 10/08/2022   TRIG 266.0 (H) 10/08/2022   HDL 33.30 (L) 10/08/2022   LDLDIRECT 72.0 10/08/2022   LDLCALC 58 11/21/2013   ALT 40 10/08/2022   AST 28 10/08/2022   NA 137 10/08/2022   K 4.5 10/08/2022   CL 98 10/08/2022   CREATININE 1.24 10/08/2022   BUN 18 10/08/2022   CO2 30 10/08/2022   TSH 0.89 10/08/2022   PSA 0.82 06/10/2019   INR 0.9  10/11/2020   HGBA1C 7.1 (H) 10/08/2022   MICROALBUR 3.2 (H) 02/06/2022    [place] FNA BX THYROID 1ST LESION AFIRMA  Result Date: 12/18/2021 INDICATION: Left superior thyroid nodule 4.0 cm EXAM: ULTRASOUND GUIDED FINE NEEDLE ASPIRATION OF INDETERMINATE THYROID NODULE COMPARISON:  US Thyroid 11/22/21 Previous biopsies of nodule #2 and nodule #8 02/2008:  Benign MEDICATIONS: 10 cc 1% lidocaine COMPLICATIONS: None immediate. TECHNIQUE: Informed written consent was obtained from the patient after a discussion of the risks, benefits and alternatives to treatment. Questions regarding the procedure were encouraged and answered. A timeout was performed prior to the initiation of the procedure. Pre-procedural ultrasound scanning demonstrated unchanged size and appearance of the indeterminate nodule within the left thyroid The procedure was planned. The neck was prepped in the usual sterile fashion, and a sterile drape was applied covering the operative field. A timeout was performed prior to the initiation of the procedure. Local anesthesia was provided with 1% lidocaine. Under direct ultrasound guidance, 5 FNA biopsies were performed of the left superior thyroid nodule with a 27 gauge needle. 2 samples were obtained for [facility] Multiple ultrasound images were saved for procedural documentation purposes. The samples were prepared and submitted to pathology. Limited post procedural scanning was negative for hematoma or additional complication. Dressings were placed. The patient tolerated the above procedures procedure well without immediate postprocedural complication. FINDINGS: Nodule reference number based on prior diagnostic ultrasound: 5 Maximum size: 4.0 cm Location: Left; Superior ACR TI-RADS risk category: TR3 (3 points) Reason for biopsy: meets ACR TI-RADS criteria Ultrasound imaging confirms appropriate placement of the needles within the thyroid nodule. IMPRESSION: Technically successful ultrasound guided fine needle  aspiration of left superior thyroid nodule Read by Robet Leu [facility] Electronically Signed   By: Irish Lack M.D.   On: 12/18/2021 16:12       Assessment & Plan:  Iron deficiency anemia, unspecified iron deficiency anemia type Assessment & Plan: Follow cbc and iron studies.    Anxiety Assessment & Plan: On zoloft.  Follow.  Stable.      Aortic atherosclerosis (HCC) Assessment & Plan: Continues on lipitor.    Low back pain without sciatica, unspecified back pain laterality, unspecified chronicity Assessment & Plan: Persistent back pain.  Being followed pain clinic - [facility]. Also evaluated - Dr Claudette Laws - [facility].  Recommended spinal cord stimulator.    Benign localized hyperplasia of prostate with urinary obstruction Assessment & Plan: BPH/OAB and low testosterone (Dr Richardo Hanks) - flomax .8mg , testosterone   Coronary artery disease involving native coronary artery of native heart without angina pectoris Assessment & Plan: Continue aspirin, lipitor and carvedilol.  No chest pain.    Type 2 diabetes mellitus with other circulatory complication, without long-term current use of insulin (HCC) Assessment & Plan: Continue diet and exercise.  Follow met  b and a1c. Low carb diet and exercise.     Gastroesophageal reflux disease, unspecified whether esophagitis present Assessment & Plan: No upper symptoms reported.  Continue protonix.    Hypercholesterolemia Assessment & Plan: Continue lipitor.  Low cholesterol diet and exercise.  Follow lipid panel and liver function tests.     Primary hypertension Assessment & Plan: Continue carvedilol and amlodipine.  Follow pressures.  Follow metabolic panel.    Nontoxic uninodular goiter Assessment & Plan: Saw Dr Gerrit Friends:  (7/023) -Overall the USN is relatively stable.  There is a nodule on the left that has increased in size and will require biopsy. Tresa Endo - please order USN guided FNA biopsy of nodule.  12/24/21 - Biopsy is benign.   Changes in the USN year-to-year were not significant.  Plan was to see him back in one year with a repeat USN and TSH level.   Obstructive sleep apnea Assessment & Plan: Continue cpap.    Paroxysmal atrial fibrillation (HCC) Assessment & Plan: Apparently documented in hospital. Is on carvedilol and aspirin.   Appears to be in SR today.  Follow.    Stress Assessment & Plan: Continue zoloft. Increased stress related to medical issues - back pain.   Follow.      Constipation, unspecified constipation type Assessment & Plan: Abdominal bloating and constipation as outlined.  Pain medication.  No able to be as active.  Miralax and stool softener.  Stay hydrated.        Dale [place], MD

## 2022-10-19 ENCOUNTER — Encounter: Payer: Self-pay | Admitting: Internal Medicine

## 2022-10-19 DIAGNOSIS — K5900 Constipation, unspecified: Secondary | ICD-10-CM | POA: Insufficient documentation

## 2022-10-19 NOTE — Assessment & Plan Note (Signed)
Persistent back pain.  Being followed pain clinic - [facility]. Also evaluated - Dr Claudette Laws - [facility].  Recommended spinal cord stimulator.

## 2022-10-19 NOTE — Assessment & Plan Note (Signed)
No upper symptoms reported.  Continue protonix.  

## 2022-10-19 NOTE — Assessment & Plan Note (Signed)
Saw Dr Gerrit Friends:  (7/023) -Overall the USN is relatively stable.  There is a nodule on the left that has increased in size and will require biopsy. [facility] End[facility] - please order USN guided FNA biopsy of nodule.  12/24/21 - Biopsy is benign.  Changes in the USN year-to-year were not significant.  Plan was to see him back in one year with a repeat USN and TSH level.

## 2022-10-19 NOTE — Assessment & Plan Note (Signed)
Continue cpap.  

## 2022-10-19 NOTE — Assessment & Plan Note (Signed)
On zoloft.  Follow.  Stable.    

## 2022-10-19 NOTE — Assessment & Plan Note (Signed)
Follow cbc and iron studies.  

## 2022-10-19 NOTE — Assessment & Plan Note (Signed)
Continue lipitor.  Low cholesterol diet and exercise.  Follow lipid panel and liver function tests.   

## 2022-10-19 NOTE — Assessment & Plan Note (Signed)
BPH/OAB and low testosterone (Dr Richardo Hanks) - flomax .8mg , testosterone

## 2022-10-19 NOTE — Assessment & Plan Note (Signed)
Continue zoloft. Increased stress related to medical issues - back pain.   Follow.

## 2022-10-19 NOTE — Assessment & Plan Note (Signed)
Continue diet and exercise.  Follow met b and a1c. Low carb diet and exercise.

## 2022-10-19 NOTE — Assessment & Plan Note (Signed)
Apparently documented in hospital. Is on carvedilol and aspirin.   Appears to be in SR today.  Follow.  

## 2022-10-19 NOTE — Assessment & Plan Note (Signed)
Continues on lipitor.  

## 2022-10-19 NOTE — Assessment & Plan Note (Signed)
Abdominal bloating and constipation as outlined.  Pain medication.  No able to be as active.  Miralax and stool softener.  Stay hydrated.

## 2022-10-19 NOTE — Assessment & Plan Note (Signed)
Continue aspirin, lipitor and carvedilol.  No chest pain.  

## 2022-10-19 NOTE — Assessment & Plan Note (Addendum)
Continue carvedilol and amlodipine.  Follow pressures.  Follow metabolic panel.

## 2022-10-23 ENCOUNTER — Other Ambulatory Visit: Payer: Self-pay | Admitting: Internal Medicine

## 2022-10-30 DIAGNOSIS — E119 Type 2 diabetes mellitus without complications: Secondary | ICD-10-CM | POA: Diagnosis not present

## 2022-10-30 DIAGNOSIS — Z8673 Personal history of transient ischemic attack (TIA), and cerebral infarction without residual deficits: Secondary | ICD-10-CM | POA: Diagnosis not present

## 2022-10-30 DIAGNOSIS — I480 Paroxysmal atrial fibrillation: Secondary | ICD-10-CM | POA: Diagnosis not present

## 2022-10-30 DIAGNOSIS — G4733 Obstructive sleep apnea (adult) (pediatric): Secondary | ICD-10-CM | POA: Diagnosis not present

## 2022-10-30 DIAGNOSIS — I2510 Atherosclerotic heart disease of native coronary artery without angina pectoris: Secondary | ICD-10-CM | POA: Diagnosis not present

## 2022-10-30 DIAGNOSIS — M5416 Radiculopathy, lumbar region: Secondary | ICD-10-CM | POA: Diagnosis not present

## 2022-10-30 DIAGNOSIS — G459 Transient cerebral ischemic attack, unspecified: Secondary | ICD-10-CM | POA: Diagnosis not present

## 2022-10-30 DIAGNOSIS — I10 Essential (primary) hypertension: Secondary | ICD-10-CM | POA: Diagnosis not present

## 2022-10-30 DIAGNOSIS — G894 Chronic pain syndrome: Secondary | ICD-10-CM | POA: Diagnosis not present

## 2022-11-01 DIAGNOSIS — M7731 Calcaneal spur, right foot: Secondary | ICD-10-CM | POA: Diagnosis not present

## 2022-11-04 DIAGNOSIS — G4733 Obstructive sleep apnea (adult) (pediatric): Secondary | ICD-10-CM | POA: Diagnosis not present

## 2022-11-06 ENCOUNTER — Other Ambulatory Visit: Payer: Self-pay | Admitting: Internal Medicine

## 2022-11-06 NOTE — Telephone Encounter (Signed)
Refilled: 07/06/2022 Last OV: 10/13/2022 Next OV: 01/15/2023

## 2022-11-07 DIAGNOSIS — M5416 Radiculopathy, lumbar region: Secondary | ICD-10-CM | POA: Diagnosis not present

## 2022-11-07 NOTE — Telephone Encounter (Signed)
Rx ok'd for xanax #30 with no refills.  ?

## 2022-11-17 ENCOUNTER — Other Ambulatory Visit: Payer: Self-pay | Admitting: Urology

## 2022-11-17 ENCOUNTER — Other Ambulatory Visit: Payer: Self-pay | Admitting: Internal Medicine

## 2022-11-18 DIAGNOSIS — E1140 Type 2 diabetes mellitus with diabetic neuropathy, unspecified: Secondary | ICD-10-CM | POA: Diagnosis not present

## 2022-11-18 DIAGNOSIS — Z8673 Personal history of transient ischemic attack (TIA), and cerebral infarction without residual deficits: Secondary | ICD-10-CM | POA: Diagnosis not present

## 2022-11-18 DIAGNOSIS — G894 Chronic pain syndrome: Secondary | ICD-10-CM | POA: Diagnosis not present

## 2022-11-18 DIAGNOSIS — Z981 Arthrodesis status: Secondary | ICD-10-CM | POA: Diagnosis not present

## 2022-11-18 DIAGNOSIS — I10 Essential (primary) hypertension: Secondary | ICD-10-CM | POA: Diagnosis not present

## 2022-11-18 DIAGNOSIS — G4733 Obstructive sleep apnea (adult) (pediatric): Secondary | ICD-10-CM | POA: Diagnosis not present

## 2022-11-18 DIAGNOSIS — D509 Iron deficiency anemia, unspecified: Secondary | ICD-10-CM | POA: Diagnosis not present

## 2022-11-18 DIAGNOSIS — M5416 Radiculopathy, lumbar region: Secondary | ICD-10-CM | POA: Diagnosis not present

## 2022-11-18 DIAGNOSIS — I480 Paroxysmal atrial fibrillation: Secondary | ICD-10-CM | POA: Diagnosis not present

## 2022-11-18 DIAGNOSIS — M5440 Lumbago with sciatica, unspecified side: Secondary | ICD-10-CM | POA: Diagnosis not present

## 2022-11-29 DIAGNOSIS — G4733 Obstructive sleep apnea (adult) (pediatric): Secondary | ICD-10-CM | POA: Diagnosis not present

## 2022-12-01 DIAGNOSIS — G8918 Other acute postprocedural pain: Secondary | ICD-10-CM | POA: Diagnosis not present

## 2022-12-11 HISTORY — PX: OTHER SURGICAL HISTORY: SHX169

## 2022-12-12 ENCOUNTER — Other Ambulatory Visit: Payer: Self-pay | Admitting: Internal Medicine

## 2022-12-17 DIAGNOSIS — G4733 Obstructive sleep apnea (adult) (pediatric): Secondary | ICD-10-CM | POA: Diagnosis not present

## 2022-12-17 DIAGNOSIS — I480 Paroxysmal atrial fibrillation: Secondary | ICD-10-CM | POA: Diagnosis not present

## 2022-12-17 DIAGNOSIS — I10 Essential (primary) hypertension: Secondary | ICD-10-CM | POA: Diagnosis not present

## 2022-12-17 DIAGNOSIS — E118 Type 2 diabetes mellitus with unspecified complications: Secondary | ICD-10-CM | POA: Diagnosis not present

## 2022-12-18 DIAGNOSIS — M5416 Radiculopathy, lumbar region: Secondary | ICD-10-CM | POA: Diagnosis not present

## 2022-12-18 DIAGNOSIS — M170 Bilateral primary osteoarthritis of knee: Secondary | ICD-10-CM | POA: Diagnosis not present

## 2022-12-18 DIAGNOSIS — M25562 Pain in left knee: Secondary | ICD-10-CM | POA: Diagnosis not present

## 2022-12-18 DIAGNOSIS — M179 Osteoarthritis of knee, unspecified: Secondary | ICD-10-CM | POA: Diagnosis not present

## 2022-12-18 DIAGNOSIS — M961 Postlaminectomy syndrome, not elsewhere classified: Secondary | ICD-10-CM | POA: Diagnosis not present

## 2022-12-18 DIAGNOSIS — M545 Low back pain, unspecified: Secondary | ICD-10-CM | POA: Diagnosis not present

## 2022-12-18 DIAGNOSIS — M5136 Other intervertebral disc degeneration, lumbar region: Secondary | ICD-10-CM | POA: Diagnosis not present

## 2022-12-24 ENCOUNTER — Other Ambulatory Visit: Payer: Self-pay | Admitting: Surgery

## 2022-12-24 DIAGNOSIS — E049 Nontoxic goiter, unspecified: Secondary | ICD-10-CM

## 2022-12-24 DIAGNOSIS — E042 Nontoxic multinodular goiter: Secondary | ICD-10-CM

## 2022-12-25 ENCOUNTER — Encounter (INDEPENDENT_AMBULATORY_CARE_PROVIDER_SITE_OTHER): Payer: Self-pay

## 2022-12-29 DIAGNOSIS — K8042 Calculus of bile duct with acute cholecystitis without obstruction: Secondary | ICD-10-CM | POA: Diagnosis not present

## 2022-12-29 DIAGNOSIS — K838 Other specified diseases of biliary tract: Secondary | ICD-10-CM | POA: Diagnosis not present

## 2022-12-29 DIAGNOSIS — R1084 Generalized abdominal pain: Secondary | ICD-10-CM | POA: Diagnosis not present

## 2022-12-29 DIAGNOSIS — K8020 Calculus of gallbladder without cholecystitis without obstruction: Secondary | ICD-10-CM | POA: Diagnosis not present

## 2022-12-29 DIAGNOSIS — K8050 Calculus of bile duct without cholangitis or cholecystitis without obstruction: Secondary | ICD-10-CM | POA: Diagnosis not present

## 2022-12-29 DIAGNOSIS — R69 Illness, unspecified: Secondary | ICD-10-CM | POA: Diagnosis not present

## 2022-12-29 DIAGNOSIS — Z7984 Long term (current) use of oral hypoglycemic drugs: Secondary | ICD-10-CM | POA: Diagnosis not present

## 2022-12-29 DIAGNOSIS — E049 Nontoxic goiter, unspecified: Secondary | ICD-10-CM | POA: Diagnosis not present

## 2022-12-29 DIAGNOSIS — I480 Paroxysmal atrial fibrillation: Secondary | ICD-10-CM | POA: Diagnosis not present

## 2022-12-29 DIAGNOSIS — K8070 Calculus of gallbladder and bile duct without cholecystitis without obstruction: Secondary | ICD-10-CM | POA: Diagnosis not present

## 2022-12-29 DIAGNOSIS — R7989 Other specified abnormal findings of blood chemistry: Secondary | ICD-10-CM | POA: Diagnosis not present

## 2022-12-29 DIAGNOSIS — B961 Klebsiella pneumoniae [K. pneumoniae] as the cause of diseases classified elsewhere: Secondary | ICD-10-CM | POA: Diagnosis not present

## 2022-12-29 DIAGNOSIS — I4891 Unspecified atrial fibrillation: Secondary | ICD-10-CM | POA: Diagnosis not present

## 2022-12-29 DIAGNOSIS — R0602 Shortness of breath: Secondary | ICD-10-CM | POA: Diagnosis not present

## 2022-12-29 DIAGNOSIS — R7881 Bacteremia: Secondary | ICD-10-CM | POA: Diagnosis not present

## 2022-12-29 DIAGNOSIS — R932 Abnormal findings on diagnostic imaging of liver and biliary tract: Secondary | ICD-10-CM | POA: Diagnosis not present

## 2022-12-29 DIAGNOSIS — R079 Chest pain, unspecified: Secondary | ICD-10-CM | POA: Diagnosis not present

## 2022-12-29 DIAGNOSIS — I10 Essential (primary) hypertension: Secondary | ICD-10-CM | POA: Diagnosis not present

## 2022-12-29 DIAGNOSIS — R109 Unspecified abdominal pain: Secondary | ICD-10-CM | POA: Diagnosis not present

## 2022-12-29 DIAGNOSIS — M79604 Pain in right leg: Secondary | ICD-10-CM | POA: Diagnosis not present

## 2022-12-29 DIAGNOSIS — E785 Hyperlipidemia, unspecified: Secondary | ICD-10-CM | POA: Diagnosis not present

## 2022-12-29 DIAGNOSIS — J9602 Acute respiratory failure with hypercapnia: Secondary | ICD-10-CM | POA: Diagnosis not present

## 2022-12-29 DIAGNOSIS — K5710 Diverticulosis of small intestine without perforation or abscess without bleeding: Secondary | ICD-10-CM | POA: Diagnosis not present

## 2022-12-29 DIAGNOSIS — R17 Unspecified jaundice: Secondary | ICD-10-CM | POA: Diagnosis not present

## 2022-12-29 DIAGNOSIS — E669 Obesity, unspecified: Secondary | ICD-10-CM | POA: Diagnosis not present

## 2022-12-29 DIAGNOSIS — K8065 Calculus of gallbladder and bile duct with chronic cholecystitis with obstruction: Secondary | ICD-10-CM | POA: Diagnosis not present

## 2022-12-29 DIAGNOSIS — K8012 Calculus of gallbladder with acute and chronic cholecystitis without obstruction: Secondary | ICD-10-CM | POA: Diagnosis not present

## 2022-12-29 DIAGNOSIS — G4733 Obstructive sleep apnea (adult) (pediatric): Secondary | ICD-10-CM | POA: Diagnosis not present

## 2022-12-29 DIAGNOSIS — J150 Pneumonia due to Klebsiella pneumoniae: Secondary | ICD-10-CM | POA: Diagnosis not present

## 2022-12-29 DIAGNOSIS — R1013 Epigastric pain: Secondary | ICD-10-CM | POA: Diagnosis not present

## 2022-12-29 DIAGNOSIS — M79605 Pain in left leg: Secondary | ICD-10-CM | POA: Diagnosis not present

## 2022-12-29 DIAGNOSIS — A419 Sepsis, unspecified organism: Secondary | ICD-10-CM | POA: Diagnosis not present

## 2022-12-29 DIAGNOSIS — K8066 Calculus of gallbladder and bile duct with acute and chronic cholecystitis without obstruction: Secondary | ICD-10-CM | POA: Diagnosis not present

## 2022-12-29 DIAGNOSIS — G894 Chronic pain syndrome: Secondary | ICD-10-CM | POA: Diagnosis not present

## 2022-12-29 DIAGNOSIS — J9601 Acute respiratory failure with hypoxia: Secondary | ICD-10-CM | POA: Diagnosis not present

## 2022-12-29 DIAGNOSIS — E662 Morbid (severe) obesity with alveolar hypoventilation: Secondary | ICD-10-CM | POA: Diagnosis not present

## 2022-12-29 DIAGNOSIS — R32 Unspecified urinary incontinence: Secondary | ICD-10-CM | POA: Diagnosis not present

## 2022-12-29 DIAGNOSIS — E119 Type 2 diabetes mellitus without complications: Secondary | ICD-10-CM | POA: Diagnosis not present

## 2022-12-29 DIAGNOSIS — B9689 Other specified bacterial agents as the cause of diseases classified elsewhere: Secondary | ICD-10-CM | POA: Diagnosis not present

## 2022-12-29 DIAGNOSIS — R0902 Hypoxemia: Secondary | ICD-10-CM | POA: Diagnosis not present

## 2022-12-29 DIAGNOSIS — R1011 Right upper quadrant pain: Secondary | ICD-10-CM | POA: Diagnosis not present

## 2022-12-30 DIAGNOSIS — R0602 Shortness of breath: Secondary | ICD-10-CM | POA: Diagnosis not present

## 2022-12-31 DIAGNOSIS — R1011 Right upper quadrant pain: Secondary | ICD-10-CM | POA: Diagnosis not present

## 2023-01-01 ENCOUNTER — Other Ambulatory Visit: Payer: Medicare PPO

## 2023-01-01 DIAGNOSIS — G894 Chronic pain syndrome: Secondary | ICD-10-CM | POA: Diagnosis not present

## 2023-01-01 DIAGNOSIS — J150 Pneumonia due to Klebsiella pneumoniae: Secondary | ICD-10-CM | POA: Diagnosis not present

## 2023-01-01 DIAGNOSIS — K8050 Calculus of bile duct without cholangitis or cholecystitis without obstruction: Secondary | ICD-10-CM | POA: Diagnosis not present

## 2023-01-01 DIAGNOSIS — E669 Obesity, unspecified: Secondary | ICD-10-CM | POA: Diagnosis not present

## 2023-01-01 DIAGNOSIS — I10 Essential (primary) hypertension: Secondary | ICD-10-CM | POA: Diagnosis not present

## 2023-01-01 DIAGNOSIS — I480 Paroxysmal atrial fibrillation: Secondary | ICD-10-CM | POA: Diagnosis not present

## 2023-01-01 DIAGNOSIS — K8033 Calculus of bile duct with acute cholangitis with obstruction: Secondary | ICD-10-CM | POA: Diagnosis not present

## 2023-01-01 DIAGNOSIS — K8042 Calculus of bile duct with acute cholecystitis without obstruction: Secondary | ICD-10-CM | POA: Diagnosis not present

## 2023-01-01 DIAGNOSIS — R7881 Bacteremia: Secondary | ICD-10-CM | POA: Diagnosis not present

## 2023-01-01 DIAGNOSIS — E119 Type 2 diabetes mellitus without complications: Secondary | ICD-10-CM | POA: Diagnosis not present

## 2023-01-01 DIAGNOSIS — E785 Hyperlipidemia, unspecified: Secondary | ICD-10-CM | POA: Diagnosis not present

## 2023-01-01 DIAGNOSIS — G4733 Obstructive sleep apnea (adult) (pediatric): Secondary | ICD-10-CM | POA: Diagnosis not present

## 2023-01-01 DIAGNOSIS — B9689 Other specified bacterial agents as the cause of diseases classified elsewhere: Secondary | ICD-10-CM | POA: Diagnosis not present

## 2023-01-05 ENCOUNTER — Telehealth: Payer: Self-pay | Admitting: Internal Medicine

## 2023-01-05 DIAGNOSIS — E7800 Pure hypercholesterolemia, unspecified: Secondary | ICD-10-CM

## 2023-01-05 DIAGNOSIS — E1159 Type 2 diabetes mellitus with other circulatory complications: Secondary | ICD-10-CM

## 2023-01-05 NOTE — Telephone Encounter (Addendum)
Pt wife would like to be called concerning stones in the pt gallbladder causing him to go to the ER in [place] while they was out of town. Pt also had surgery in [place] to get rid of a stone that had to come out to get rid of a blood infection he had

## 2023-01-05 NOTE — Telephone Encounter (Signed)
Patient need lab orders.

## 2023-01-05 NOTE — Telephone Encounter (Signed)
Lab orders placed.  

## 2023-01-06 ENCOUNTER — Telehealth: Payer: Self-pay

## 2023-01-06 NOTE — Transitions of Care (Post Inpatient/ED Visit) (Signed)
01/06/2023  Name: Ricardo Nash MRN: [id number] DOB: 1945-11-17  Today's TOC FU Call Status: Today's TOC FU Call Status:: Successful TOC FU Call Completed Patient's Name and Date of Birth confirmed.  Transition Care Management Follow-up Telephone Call Date of Discharge: 01/04/23 Discharge Facility: Other ([facility] Facility) Name of Other ([facility]) Discharge Facility: [facility] Type of Discharge: Inpatient Admission Primary Inpatient Discharge Diagnosis:: Cholelithiasis How have you been since you were released from the hospital?: Better (Patient notes he is very weak and his incision is sore) Any questions or concerns?: No  Items Reviewed: Did you receive and understand the discharge instructions provided?: Yes Medications obtained,verified, and reconciled?: Yes (Medications Reviewed) Any new allergies since your discharge?: No Dietary orders reviewed?: No Do you have support at home?: Yes People in Home: spouse Name of Support/Comfort Primary Source: Sharl Ma  Medications Reviewed Today: Medications Reviewed Today     Reviewed by Jodelle Gross, RN (Case Manager) on 01/06/23 at 1232  Med List Status: <None>   Medication Order Taking? Sig Documenting Provider Last Dose Status Informant  ACCU-CHEK GUIDE test strip [id number] Yes USE AS DIRECTED TO TEST BLOOD GLUCOSE LEVELS TWICE DAILY Dale [place], MD Taking Active   ALPRAZolam Prudy Feeler) 0.25 MG tablet [id number] Yes TAKE 1 TABLET BY MOUTH ONCE DAILY AS NEEDED Dale [place], MD Taking Active   amLODipine (NORVASC) 10 MG tablet [id number] Yes Take 1 tablet (10 mg total) by mouth daily. Dale [place], MD Taking Active   Ascorbic Acid (VITAMIN C) 100 MG tablet [id number] Yes Take 100 mg by mouth in the morning. [provider] Taking Active Self, Spouse/Significant Other  aspirin EC 81 MG tablet [id number] Yes Take 81 mg by mouth every evening. Swallow whole. [provider] Taking Active Self,  Spouse/Significant Other  atorvastatin (LIPITOR) 20 MG tablet [id number] Yes TAKE 1 TABLET BY MOUTH ONCE EVERY Brett Canales, MD Taking Active   Azelastine-Fluticasone 137-50 MCG/ACT SUSP [id number] Yes PLACE 1 SPRAY INTO EACH NOSTRIL ONCE EVERY MORNING AND AT BEDTIME Dale [place], MD Taking Active   bisacodyl (DULCOLAX) 10 MG suppository [id number] Yes Place 10 mg rectally daily as needed for moderate constipation. [provider] Taking Active   blood glucose meter kit and supplies [id number] Yes Dispense based on patient and insurance preference. Use up to four times daily as directed. (FOR ICD-10 E10.9, E11.9). Dale [place], MD Taking Active Self, Spouse/Significant Other  carvedilol (COREG) 25 MG tablet [id number] Yes TAKE 1 TABLET BY MOUTH TWICE DAILY WITH MEALS Dale [place], MD Taking Active   docusate sodium (COLACE) 100 MG capsule [id number] Yes Take 100 mg by mouth daily. [provider] Taking Active   Dulaglutide (TRULICITY) 3 MG/0.5ML SOPN [id number] Yes Inject 3 mg as directed once a week. Dale [place], MD Taking Active   levocetirizine (XYZAL) 5 MG tablet [id number] Yes TAKE 1 TABLET BY MOUTH ONCE EVERY Brett Canales, MD Taking Active   levofloxacin (LEVAQUIN) 750 MG tablet [id number] Yes Take 750 mg by mouth daily. [provider] Taking Active   lisinopril (ZESTRIL) 40 MG tablet [id number] Yes TAKE 1 TABLET BY MOUTH ONCE DAILY Dale [place], MD Taking Active Spouse/Significant Other  meloxicam (MOBIC) 15 MG tablet [id number] Yes Take 15 mg by mouth daily. [provider] Taking Active Spouse/Significant Other  metFORMIN (GLUCOPHAGE) 1000 MG tablet [id number] Yes Take 1 tablet (1,000 mg total) by mouth 2 (two) times daily with a meal. Dale [place], MD Taking Active   montelukast (SINGULAIR) 10 MG tablet [id number] Yes TAKE  1 TABLET BY MOUTH AT BEDTIME Dale [place], MD Taking Active   Multiple Vitamin (MULTIVITAMIN WITH  MINERALS) TABS tablet [id number] Yes Take 1 tablet by mouth in the morning. [provider] Taking Active Self, Spouse/Significant Other  oxyCODONE (OXY IR/ROXICODONE) 5 MG immediate release tablet [id number] Yes Take 5 mg by mouth 3 (three) times daily as needed. [provider] Taking Active   pantoprazole (PROTONIX) 40 MG tablet [id number] Yes Take 1 tablet (40 mg total) by mouth daily. Dale [place], MD Taking Active   pioglitazone (ACTOS) 15 MG tablet [id number] Yes Take 1 tablet (15 mg total) by mouth daily. Dale [place], MD Taking Active   pregabalin (LYRICA) 100 MG capsule [id number] Yes Take 100 mg by mouth 2 (two) times daily. [provider] Taking Active   sertraline (ZOLOFT) 100 MG tablet [id number] Yes TAKE 1 TABLET BY MOUTH ONCE DAILY Dale [place], MD Taking Active   tamsulosin (FLOMAX) 0.4 MG CAPS capsule [id number] Yes TAKE 2 CAPSULES BY MOUTH ONCE DAILY AFTER SUPPER Sondra Come, MD Taking Active   testosterone cypionate (DEPOTESTOSTERONE CYPIONATE) 200 MG/ML injection [id number] Yes INJECT INTRAMUSCULARLY EVERY 14 DAYS Sondra Come, MD Taking Active   traZODone (DESYREL) 50 MG tablet [id number] Yes TAKE 1 and 1/2 TABLETS BY MOUTH AT BEDTIME Dale [place], MD Taking Active   triamterene-hydrochlorothiazide (MAXZIDE-25) 37.5-25 MG tablet [id number] Yes TAKE 1 TABLET BY MOUTH ONCE DAILY Dale [place], MD Taking Active             Home Care and Equipment/Supplies: Were Home Health Services Ordered?: No Any new equipment or medical supplies ordered?: No  Functional Questionnaire: Do you need assistance with bathing/showering or dressing?: Yes Do you need assistance with meal preparation?: Yes Do you need assistance with eating?: No Do you have difficulty maintaining continence: No Do you need assistance with getting out of bed/getting out of a chair/moving?: No Do you have difficulty managing or taking your medications?: No  Follow  up appointments reviewed: PCP Follow-up appointment confirmed?: Yes Date of PCP follow-up appointment?: 01/15/23 Follow-up Provider: Dr. Lorin Picket Specialist [facility] Follow-up appointment confirmed?: NA Do you need transportation to your follow-up appointment?: No Do you understand care options if your condition(s) worsen?: Yes-patient verbalized understanding  SDOH Interventions Today    Flowsheet Row Most Recent Value  SDOH Interventions   Food Insecurity Interventions Intervention Not Indicated  Housing Interventions Intervention Not Indicated  Transportation Interventions Intervention Not Indicated      Jodelle Gross RN, BSN, CCM [facility] RN Care Coordinator/ Transitions of Care Direct Dial: [phone]  Fax: [phone]

## 2023-01-07 DIAGNOSIS — R7881 Bacteremia: Secondary | ICD-10-CM | POA: Diagnosis not present

## 2023-01-07 DIAGNOSIS — K8033 Calculus of bile duct with acute cholangitis with obstruction: Secondary | ICD-10-CM | POA: Diagnosis not present

## 2023-01-07 MED ORDER — ONDANSETRON HCL 4 MG PO TABS: 4.0000 mg | ORAL_TABLET | Freq: Three times a day (TID) | ORAL | 0 refills | Status: DC | PRN

## 2023-01-07 NOTE — Telephone Encounter (Signed)
Patient's wife called and patient is sick to his stomach. She was wondering if Dr Lorin Picket could give him something for his stomach. He also threw up a couple of times. Patient is not really eating well.

## 2023-01-07 NOTE — Telephone Encounter (Signed)
Spoke with patient and wife. He was admitted to the hospital in [place], [place] last week to have his gallbladder removed. Per patient, plan was to remove the gallbladder and do ERCP at the same hospital but the surgeon was unable to successfully do the ERCP during gallbladder removal because it was blocking the bile duct. Patient was then transferred to [facility] in [place] where he had a successful ERCP done. Patient was discharged and sent home but was not instructed to do anything except for follow up with PCP. Per patient- his incisions are still sore and he feels like he has to urinate all the time. He also noted a 10 pound weight change from date of admission to present. 187 prior to surgery and 196 as of yesterday. Pt reports no SOB, no leg swelling. His incisions are not draining, bleeding, oozing, etc. He is scheduled for HFU and I have called both hospitals to request records.

## 2023-01-07 NOTE — Addendum Note (Signed)
Addended by: Rita Ohara D on: 01/07/2023 03:41 PM   Modules accepted: Orders

## 2023-01-07 NOTE — Telephone Encounter (Signed)
Confirm only allergy is pcn.  If so, then I can send in zofran 4mg  - take one q 8 hours prn (#21) no refills.  If nausea/symptoms persist or worsen - will need to be evaluated

## 2023-01-07 NOTE — Telephone Encounter (Signed)
Patient aware. Confirmed allergies. Zofran sent in with directions below.

## 2023-01-13 ENCOUNTER — Other Ambulatory Visit (INDEPENDENT_AMBULATORY_CARE_PROVIDER_SITE_OTHER): Payer: Medicare PPO

## 2023-01-13 DIAGNOSIS — E7800 Pure hypercholesterolemia, unspecified: Secondary | ICD-10-CM

## 2023-01-13 DIAGNOSIS — E1159 Type 2 diabetes mellitus with other circulatory complications: Secondary | ICD-10-CM | POA: Diagnosis not present

## 2023-01-13 LAB — LIPID PANEL
Cholesterol: 77 mg/dL (ref 0–200)
HDL: 24.6 mg/dL — ABNORMAL LOW
LDL Cholesterol: 14 mg/dL (ref 0–99)
NonHDL: 52.84
Total CHOL/HDL Ratio: 3
Triglycerides: 195 mg/dL — ABNORMAL HIGH (ref 0.0–149.0)
VLDL: 39 mg/dL (ref 0.0–40.0)

## 2023-01-13 LAB — BASIC METABOLIC PANEL WITH GFR
BUN: 16 mg/dL (ref 6–23)
CO2: 27 meq/L (ref 19–32)
Calcium: 8.4 mg/dL (ref 8.4–10.5)
Chloride: 103 meq/L (ref 96–112)
Creatinine, Ser: 1.04 mg/dL (ref 0.40–1.50)
GFR: 68.72 mL/min
Glucose, Bld: 119 mg/dL — ABNORMAL HIGH (ref 70–99)
Potassium: 3.9 meq/L (ref 3.5–5.1)
Sodium: 141 meq/L (ref 135–145)

## 2023-01-13 LAB — HEPATIC FUNCTION PANEL
ALT: 14 U/L (ref 0–53)
AST: 16 U/L (ref 0–37)
Albumin: 3.5 g/dL (ref 3.5–5.2)
Alkaline Phosphatase: 66 U/L (ref 39–117)
Bilirubin, Direct: 0.3 mg/dL (ref 0.0–0.3)
Total Bilirubin: 0.9 mg/dL (ref 0.2–1.2)
Total Protein: 6 g/dL (ref 6.0–8.3)

## 2023-01-13 LAB — HEMOGLOBIN A1C: Hgb A1c MFr Bld: 7 % — ABNORMAL HIGH (ref 4.6–6.5)

## 2023-01-14 ENCOUNTER — Ambulatory Visit: Payer: Medicare PPO | Admitting: Internal Medicine

## 2023-01-15 ENCOUNTER — Ambulatory Visit: Payer: Medicare PPO | Admitting: Internal Medicine

## 2023-01-15 ENCOUNTER — Encounter: Payer: Self-pay | Admitting: Internal Medicine

## 2023-01-15 ENCOUNTER — Telehealth: Payer: Self-pay

## 2023-01-15 VITALS — BP 128/68 | HR 84 | Temp 97.6°F | Ht 69.0 in | Wt 180.6 lb

## 2023-01-15 DIAGNOSIS — E041 Nontoxic single thyroid nodule: Secondary | ICD-10-CM | POA: Diagnosis not present

## 2023-01-15 DIAGNOSIS — D509 Iron deficiency anemia, unspecified: Secondary | ICD-10-CM

## 2023-01-15 DIAGNOSIS — Z23 Encounter for immunization: Secondary | ICD-10-CM | POA: Diagnosis not present

## 2023-01-15 DIAGNOSIS — R1032 Left lower quadrant pain: Secondary | ICD-10-CM

## 2023-01-15 DIAGNOSIS — K219 Gastro-esophageal reflux disease without esophagitis: Secondary | ICD-10-CM

## 2023-01-15 DIAGNOSIS — F419 Anxiety disorder, unspecified: Secondary | ICD-10-CM | POA: Diagnosis not present

## 2023-01-15 DIAGNOSIS — G4733 Obstructive sleep apnea (adult) (pediatric): Secondary | ICD-10-CM | POA: Diagnosis not present

## 2023-01-15 DIAGNOSIS — F439 Reaction to severe stress, unspecified: Secondary | ICD-10-CM | POA: Diagnosis not present

## 2023-01-15 DIAGNOSIS — I700 Atherosclerosis of aorta: Secondary | ICD-10-CM

## 2023-01-15 DIAGNOSIS — I10 Essential (primary) hypertension: Secondary | ICD-10-CM

## 2023-01-15 DIAGNOSIS — I2510 Atherosclerotic heart disease of native coronary artery without angina pectoris: Secondary | ICD-10-CM

## 2023-01-15 DIAGNOSIS — E7800 Pure hypercholesterolemia, unspecified: Secondary | ICD-10-CM

## 2023-01-15 DIAGNOSIS — I480 Paroxysmal atrial fibrillation: Secondary | ICD-10-CM

## 2023-01-15 DIAGNOSIS — E1159 Type 2 diabetes mellitus with other circulatory complications: Secondary | ICD-10-CM | POA: Diagnosis not present

## 2023-01-15 NOTE — Telephone Encounter (Signed)
Med rec request refaxed

## 2023-01-15 NOTE — Telephone Encounter (Signed)
LM for medical records at [facility] x 3 attempts. I have also faxed a request.

## 2023-01-15 NOTE — Progress Notes (Signed)
Subjective:    Patient ID: Ricardo Nash, male    DOB: January 27, 1946, 77 y.o.   MRN: [id number]  Patient here for  Chief Complaint  Patient presents with   Hospitalization Follow-up    HPI Here to follow up regarding hypertension, diabetes, afib and hypercholesterolemia - and hospital follow up. Admitted 01/01/23 - 01/04/23.  Has known CAD - on aspirin and plavix. Recently admitted with gallbladder issues [facility].  S/p cholecystectomy with intra op cholangiography that revealed filling defect. Unable to do ERCP at [facility] and he was transferred to [facility] in [place] - successful ERCP.  During the hospitalization at [facility] - became septic.  Blood culture positive for kelebsiella.  Treated with levaquin.  Successful ERCP 8/23 - with removal of stone. Recommended home PT/OT.  States he is feeling better.  Appetite improving.  Still soreness - with certain movements.  Weight has fluctuated.  Prior to his trip - weight 187 lbs.  After hospitalization 197 lbs.  Post discharge 174 lbs and today 180 lbs.  States he was previously full of fluid - with hospitalization.  Had bowel movement yesterday - more formed.  Blood sugar and blood pressures doing well.  Prior to his hospitalization he saw a new cardiologist.  She recommended starting eliquis.  He is hesitant to start.  Was told by cardiology previously - did not need.  Reports had isolated afib when sick in hospital.  He is on aspirin.  No increased heart rate or palpitations.  Breathing stable.    Past Medical History:  Diagnosis Date   C. difficile colitis    Coronary artery disease    Depression    Diabetes mellitus (HCC)    Diverticulitis    Diverticulosis    Dysrhythmia    PAF   Environmental allergies    GERD (gastroesophageal reflux disease)    Goiter    intrathoracic, s/p benign biopsy (Dr Gerrit Friends)   Hypercholesterolemia    Hypertension    Osteoarthritis    cervical spine, lumbar spine   Paroxysmal atrial  fibrillation (HCC)    Pneumonia    PONV (postoperative nausea and vomiting)    Sleep apnea    CPAP @ NIGHT   Stroke [facility])    TIA (transient ischemic attack)    Past Surgical History:  Procedure Laterality Date   BACK SURGERY  8/08   s/p fusion of L4-L5   capsule endoscopy     CATARACT EXTRACTION  2011   Dr. Elmer Picker   CERVICAL DISC SURGERY  2002   COLONOSCOPY WITH PROPOFOL N/A 07/20/2017   Procedure: COLONOSCOPY WITH PROPOFOL;  Surgeon: Scot Jun, MD;  Location: [facility] ENDOSCOPY;  Service: Endoscopy;  Laterality: N/A;   EYE SURGERY     cataract bilateral 10/1999   eyelid reduction     Dr. Beryle Flock HERNIA REPAIR  1991   Dr, Tobie Poet LAMINECTOMY/DECOMPRESSION MICRODISCECTOMY N/A 10/15/2020   Procedure: Laminectomy - Lumbar Two-Lumbar Three - Lumbar Three-Lumbar Four with sublaminar decompression;  Surgeon: Tia Alert, MD;  Location: [facility] OR;  Service: Neurosurgery;  Laterality: N/A;  Laminectomy - Lumbar Two-Lumbar Three - Lumbar Three-Lumbar Four with sublaminar decompression   NOSE SURGERY     turbinate reduction   SEPTOPLASTY  1975   SHOULDER SURGERY  2000   rotator cuff   Family History  Problem Relation Age of Onset   Congestive Heart Failure Father    Heart disease Father  myocardial infarction   Rheumatic fever Father        valvular disease   Heart disease Mother        s/p CABG (age 98)   Kidney disease Sister    Colon cancer Neg Hx    Prostate cancer Neg Hx    Social History   Socioeconomic History   Marital status: Married    Spouse name: Not on file   Number of children: 2   Years of education: Not on file   Highest education level: Not on file  Occupational History   Occupation: retired Runner, broadcasting/film/video  Tobacco Use   Smoking status: Former   Smokeless tobacco: Never  Vaping Use   Vaping status: Never Used  Substance and Sexual Activity   Alcohol use: Yes    Alcohol/week: 0.0 standard drinks of alcohol    Comment: occasional    Drug use: No   Sexual activity: Not Currently  Other Topics Concern   Not on file  Social History Narrative   Not on file   Social Determinants of Health   Financial Resource Strain: Low Risk  (11/07/2022)   Received from [facility], [facility]   Overall Financial Resource Strain (CARDIA)    Difficulty of Paying Living Expenses: Not hard at all  Food Insecurity: No Food Insecurity (01/06/2023)   Hunger Vital Sign    Worried About Running Out of Food in the Last Year: Never true    Ran Out of Food in the Last Year: Never true  Transportation Needs: No Transportation Needs (01/06/2023)   PRAPARE - Administrator, Civil Service (Medical): No    Lack of Transportation (Non-Medical): No  Physical Activity: Insufficiently Active (06/30/2022)   Exercise Vital Sign    Days of Exercise per Week: 2 days    Minutes of Exercise per Session: 60 min  Stress: No Stress Concern Present (06/30/2022)   [employer] of Occupational Health - Occupational Stress Questionnaire    Feeling of Stress : Only a little  Social Connections: Unknown (06/30/2022)   Social Connection and Isolation Panel [NHANES]    Frequency of Communication with Friends and Family: Not on file    Frequency of Social Gatherings with Friends and Family: Not on file    Attends Religious Services: Not on file    Active Member of Clubs or Organizations: Not on file    Attends Banker Meetings: Not on file    Marital Status: Married     Review of Systems  Constitutional:  Positive for fatigue. Negative for appetite change and unexpected weight change.  HENT:  Negative for congestion and sinus pressure.   Respiratory:  Negative for cough, chest tightness and shortness of breath.   Cardiovascular:  Negative for chest pain, palpitations and leg swelling.  Gastrointestinal:  Negative for vomiting.       Appetite improving.  Pain improved.  Still with soreness with  certain movements as outlined.   Genitourinary:  Negative for difficulty urinating and dysuria.  Musculoskeletal:  Negative for joint swelling and myalgias.  Skin:  Negative for rash.  Neurological:  Negative for dizziness and headaches.  Psychiatric/Behavioral:  Negative for agitation and dysphoric mood.        Objective:     BP 128/68   Pulse 84   Temp 97.6 F (36.4 C) (Oral)   Ht 5\' 9"  (1.753 m)   Wt 180 lb 9.6 oz (81.9 kg)   SpO2 99%  BMI 26.67 kg/m  Wt Readings from Last 3 Encounters:  01/15/23 180 lb 9.6 oz (81.9 kg)  10/13/22 193 lb 9.6 oz (87.8 kg)  06/30/22 198 lb (89.8 kg)    Physical Exam Constitutional:      General: He is not in acute distress.    Appearance: Normal appearance. He is well-developed.  HENT:     Head: Normocephalic and atraumatic.     Right Ear: External ear normal.     Left Ear: External ear normal.  Eyes:     General: No scleral icterus.       Right eye: No discharge.        Left eye: No discharge.  Cardiovascular:     Rate and Rhythm: Normal rate and regular rhythm.  Pulmonary:     Effort: Pulmonary effort is normal. No respiratory distress.     Breath sounds: Normal breath sounds.  Abdominal:     General: Bowel sounds are normal.     Palpations: Abdomen is soft.     Comments: Minimal tenderness - right side - over incision site.  Bandaids removed.  Steri strips in placed.  No increased erythema.   Musculoskeletal:        General: No swelling or tenderness.     Cervical back: Neck supple. No tenderness.  Lymphadenopathy:     Cervical: No cervical adenopathy.  Skin:    Findings: No erythema or rash.  Neurological:     Mental Status: He is alert.  Psychiatric:        Mood and Affect: Mood normal.        Behavior: Behavior normal.      Outpatient Encounter Medications as of 01/15/2023  Medication Sig   ACCU-CHEK GUIDE test strip USE AS DIRECTED TO TEST BLOOD GLUCOSE LEVELS TWICE DAILY   ALPRAZolam (XANAX) 0.25 MG tablet  TAKE 1 TABLET BY MOUTH ONCE DAILY AS NEEDED   amLODipine (NORVASC) 10 MG tablet Take 1 tablet (10 mg total) by mouth daily.   Ascorbic Acid (VITAMIN C) 100 MG tablet Take 100 mg by mouth in the morning.   aspirin EC 81 MG tablet Take 81 mg by mouth every evening. Swallow whole.   atorvastatin (LIPITOR) 20 MG tablet TAKE 1 TABLET BY MOUTH ONCE EVERY EVENING   Azelastine-Fluticasone 137-50 MCG/ACT SUSP PLACE 1 SPRAY INTO EACH NOSTRIL ONCE EVERY MORNING AND AT BEDTIME   blood glucose meter kit and supplies Dispense based on patient and insurance preference. Use up to four times daily as directed. (FOR ICD-10 E10.9, E11.9).   carvedilol (COREG) 25 MG tablet TAKE 1 TABLET BY MOUTH TWICE DAILY WITH MEALS   docusate sodium (COLACE) 100 MG capsule Take 100 mg by mouth daily.   Dulaglutide (TRULICITY) 3 MG/0.5ML SOPN Inject 3 mg as directed once a week.   levocetirizine (XYZAL) 5 MG tablet TAKE 1 TABLET BY MOUTH ONCE EVERY EVENING   lisinopril (ZESTRIL) 40 MG tablet TAKE 1 TABLET BY MOUTH ONCE DAILY   meloxicam (MOBIC) 15 MG tablet Take 15 mg by mouth daily.   metFORMIN (GLUCOPHAGE) 1000 MG tablet Take 1 tablet (1,000 mg total) by mouth 2 (two) times daily with a meal.   montelukast (SINGULAIR) 10 MG tablet TAKE 1 TABLET BY MOUTH AT BEDTIME   Multiple Vitamin (MULTIVITAMIN WITH MINERALS) TABS tablet Take 1 tablet by mouth in the morning.   ondansetron (ZOFRAN) 4 MG tablet Take 1 tablet (4 mg total) by mouth every 8 (eight) hours as needed for nausea or  vomiting.   oxyCODONE (OXY IR/ROXICODONE) 5 MG immediate release tablet Take 5 mg by mouth 3 (three) times daily as needed.   pantoprazole (PROTONIX) 40 MG tablet Take 1 tablet (40 mg total) by mouth daily.   pioglitazone (ACTOS) 15 MG tablet Take 1 tablet (15 mg total) by mouth daily.   pregabalin (LYRICA) 100 MG capsule Take 100 mg by mouth 2 (two) times daily.   sertraline (ZOLOFT) 100 MG tablet TAKE 1 TABLET BY MOUTH ONCE DAILY   tamsulosin (FLOMAX)  0.4 MG CAPS capsule TAKE 2 CAPSULES BY MOUTH ONCE DAILY AFTER SUPPER   testosterone cypionate (DEPOTESTOSTERONE CYPIONATE) 200 MG/ML injection INJECT INTRAMUSCULARLY EVERY 14 DAYS   traZODone (DESYREL) 50 MG tablet TAKE 1 and 1/2 TABLETS BY MOUTH AT BEDTIME   triamterene-hydrochlorothiazide (MAXZIDE-25) 37.5-25 MG tablet TAKE 1 TABLET BY MOUTH ONCE DAILY   No facility-administered encounter medications on file as of 01/15/2023.     Lab Results  Component Value Date   WBC 8.6 10/08/2022   HGB 14.1 10/08/2022   HGB 13.6 10/08/2022   HCT 43.8 10/08/2022   HCT 41.7 10/08/2022   PLT 275.0 10/08/2022   GLUCOSE 119 (H) 01/13/2023   CHOL 77 01/13/2023   TRIG 195.0 (H) 01/13/2023   HDL 24.60 (L) 01/13/2023   LDLDIRECT 72.0 10/08/2022   LDLCALC 14 01/13/2023   ALT 14 01/13/2023   AST 16 01/13/2023   NA 141 01/13/2023   K 3.9 01/13/2023   CL 103 01/13/2023   CREATININE 1.04 01/13/2023   BUN 16 01/13/2023   CO2 27 01/13/2023   TSH 0.89 10/08/2022   PSA 0.82 06/10/2019   INR 0.9 10/11/2020   HGBA1C 7.0 (H) 01/13/2023   MICROALBUR 3.2 (H) 02/06/2022    [place] FNA BX THYROID 1ST LESION AFIRMA  Result Date: 12/18/2021 INDICATION: Left superior thyroid nodule 4.0 cm EXAM: ULTRASOUND GUIDED FINE NEEDLE ASPIRATION OF INDETERMINATE THYROID NODULE COMPARISON:  US Thyroid 11/22/21 Previous biopsies of nodule #2 and nodule #8 02/2008:  Benign MEDICATIONS: 10 cc 1% lidocaine COMPLICATIONS: None immediate. TECHNIQUE: Informed written consent was obtained from the patient after a discussion of the risks, benefits and alternatives to treatment. Questions regarding the procedure were encouraged and answered. A timeout was performed prior to the initiation of the procedure. Pre-procedural ultrasound scanning demonstrated unchanged size and appearance of the indeterminate nodule within the left thyroid The procedure was planned. The neck was prepped in the usual sterile fashion, and a sterile drape was applied  covering the operative field. A timeout was performed prior to the initiation of the procedure. Local anesthesia was provided with 1% lidocaine. Under direct ultrasound guidance, 5 FNA biopsies were performed of the left superior thyroid nodule with a 27 gauge needle. 2 samples were obtained for [facility] Multiple ultrasound images were saved for procedural documentation purposes. The samples were prepared and submitted to pathology. Limited post procedural scanning was negative for hematoma or additional complication. Dressings were placed. The patient tolerated the above procedures procedure well without immediate postprocedural complication. FINDINGS: Nodule reference number based on prior diagnostic ultrasound: 5 Maximum size: 4.0 cm Location: Left; Superior ACR TI-RADS risk category: TR3 (3 points) Reason for biopsy: meets ACR TI-RADS criteria Ultrasound imaging confirms appropriate placement of the needles within the thyroid nodule. IMPRESSION: Technically successful ultrasound guided fine needle aspiration of left superior thyroid nodule Read by Robet Le[facility] Electronically Signed   By: Irish Lack M.D.   On: 12/18/2021 16:12  Assessment & Plan:  Encounter for immunization -     Flu Vaccine Trivalent High Dose (Fluad)  Left lower quadrant abdominal pain Assessment & Plan: Recent admission as outlined.  Recently admitted with gallbladder issues [facility].  S/p cholecystectomy with intra op cholangiography that revealed filling defect. Unable to do ERCP at [facility] and he was transferred to [facility] in [place] - successful ERCP.  During the hospitalization at [facility] - became septic.  Blood culture positive for kelebsiella.  Treated with levaquin.  Successful ERCP 8/23 - with removal of stone. Recommended home PT/OT.  States he is feeling better.  Appetite improving.  Still soreness - with certain movements. Bandaids removed.  Steri strips in place.  Bowels more formed  yesterday.  Advance diet slowly.  Follow.    Iron deficiency anemia, unspecified iron deficiency anemia type Assessment & Plan: Follow cbc and iron studies.    Anxiety Assessment & Plan: On zoloft.  Follow.  Stable.      Aortic atherosclerosis (HCC) Assessment & Plan: Continues on lipitor.    Type 2 diabetes mellitus with other circulatory complication, without long-term current use of insulin (HCC) Assessment & Plan: Continue diet and exercise.  Follow met b and a1c. Low carb diet and exercise.     Paroxysmal atrial fibrillation (HCC) Assessment & Plan: Previously noted to be in afib when sick in hospital.  Prior to this most recent hospitalization he saw a new cardiologist.  She recommended starting eliquis.  He is hesitant to start.  Was told by cardiology previously - did not need.  Reports had isolated afib when sick in hospital.  He is on aspirin.  No increased heart rate or palpitations.  Breathing stable. Discussed f/u with cardiology.  Request to see cardiology through [facility].    Orders: -     Ambulatory referral to Cardiology  Stress Assessment & Plan: Continue zoloft. Increased stress related to medical issues. Has good support.  Follow.      Obstructive sleep apnea Assessment & Plan: Continue cpap.    Nontoxic uninodular goiter Assessment & Plan: Saw Dr Gerrit Friends:  (7/023) -Overall the USN is relatively stable.  There is a nodule on the left that has increased in size and will require biopsy. Tresa Endo - please order USN guided FNA biopsy of nodule.  12/24/21 - Biopsy is benign.  Changes in the USN year-to-year were not significant.  Plan was to see him back in one year with a repeat USN and TSH level.   Primary hypertension Assessment & Plan: Continue carvedilol and amlodipine.  Follow pressures.  Follow metabolic panel.    Hypercholesterolemia Assessment & Plan: Continue lipitor.  Low cholesterol diet and exercise.  Follow lipid panel and liver function tests.      Gastroesophageal reflux disease, unspecified whether esophagitis present Assessment & Plan: No upper symptoms reported.  Continue protonix.    Coronary artery disease involving native coronary artery of native heart without angina pectoris Assessment & Plan: Continue aspirin, lipitor and carvedilol.  No chest pain.   Orders: -     Ambulatory referral to Cardiology     Dale Ir[place]ale, MD

## 2023-01-15 NOTE — Telephone Encounter (Signed)
[facility] just called back from [facility]. She asked could you fax it again she didn't receive anything.

## 2023-01-17 ENCOUNTER — Encounter: Payer: Self-pay | Admitting: Internal Medicine

## 2023-01-17 DIAGNOSIS — Z9049 Acquired absence of other specified parts of digestive tract: Secondary | ICD-10-CM | POA: Diagnosis not present

## 2023-01-17 DIAGNOSIS — E119 Type 2 diabetes mellitus without complications: Secondary | ICD-10-CM | POA: Diagnosis not present

## 2023-01-17 DIAGNOSIS — Z48815 Encounter for surgical aftercare following surgery on the digestive system: Secondary | ICD-10-CM | POA: Diagnosis not present

## 2023-01-17 DIAGNOSIS — A414 Sepsis due to anaerobes: Secondary | ICD-10-CM | POA: Diagnosis not present

## 2023-01-17 DIAGNOSIS — K812 Acute cholecystitis with chronic cholecystitis: Secondary | ICD-10-CM | POA: Diagnosis not present

## 2023-01-17 DIAGNOSIS — I10 Essential (primary) hypertension: Secondary | ICD-10-CM | POA: Diagnosis not present

## 2023-01-17 DIAGNOSIS — G894 Chronic pain syndrome: Secondary | ICD-10-CM | POA: Diagnosis not present

## 2023-01-17 DIAGNOSIS — M549 Dorsalgia, unspecified: Secondary | ICD-10-CM | POA: Diagnosis not present

## 2023-01-17 DIAGNOSIS — K5710 Diverticulosis of small intestine without perforation or abscess without bleeding: Secondary | ICD-10-CM | POA: Diagnosis not present

## 2023-01-17 NOTE — Assessment & Plan Note (Signed)
Continue aspirin, lipitor and carvedilol.  No chest pain.  

## 2023-01-17 NOTE — Assessment & Plan Note (Signed)
Continue diet and exercise.  Follow met b and a1c. Low carb diet and exercise.

## 2023-01-17 NOTE — Assessment & Plan Note (Signed)
Continue lipitor.  Low cholesterol diet and exercise.  Follow lipid panel and liver function tests.   

## 2023-01-17 NOTE — Assessment & Plan Note (Signed)
Continues on lipitor.  

## 2023-01-17 NOTE — Assessment & Plan Note (Signed)
Continue carvedilol and amlodipine.  Follow pressures.  Follow metabolic panel.

## 2023-01-17 NOTE — Assessment & Plan Note (Signed)
Previously noted to be in afib when sick in hospital.  Prior to this most recent hospitalization he saw a new cardiologist.  She recommended starting eliquis.  He is hesitant to start.  Was told by cardiology previously - did not need.  Reports had isolated afib when sick in hospital.  He is on aspirin.  No increased heart rate or palpitations.  Breathing stable. Discussed f/u with cardiology.  Request to see cardiology through [facility].

## 2023-01-17 NOTE — Assessment & Plan Note (Signed)
Continue cpap.  

## 2023-01-17 NOTE — Assessment & Plan Note (Signed)
Saw Dr Ricardo Nash:  (7/023) -Overall the USN is relatively stable.  There is a nodule on the left that has increased in size and will require biopsy. Ricardo Nash - please order USN guided FNA biopsy of nodule.  12/24/21 - Biopsy is benign.  Changes in the USN year-to-year were not significant.  Plan was to see him back in one year with a repeat USN and TSH level.

## 2023-01-17 NOTE — Assessment & Plan Note (Signed)
On zoloft.  Follow.  Stable.

## 2023-01-17 NOTE — Assessment & Plan Note (Signed)
Follow cbc and iron studies.  

## 2023-01-17 NOTE — Assessment & Plan Note (Signed)
No upper symptoms reported.  Continue protonix.  

## 2023-01-17 NOTE — Assessment & Plan Note (Signed)
Continue zoloft. Increased stress related to medical issues. Has good support.  Follow.

## 2023-01-17 NOTE — Assessment & Plan Note (Signed)
Recent admission as outlined.  Recently admitted with gallbladder issues [facility].  S/p cholecystectomy with intra op cholangiography that revealed filling defect. Unable to do ERCP at [facility] and he was transferred to [facility] in [place] - successful ERCP.  During the hospitalization at [facility] - became septic.  Blood culture positive for kelebsiella.  Treated with levaquin.  Successful ERCP 8/23 - with removal of stone. Recommended home PT/OT.  States he is feeling better.  Appetite improving.  Still soreness - with certain movements. Bandaids removed.  Steri strips in place.  Bowels more formed yesterday.  Advance diet slowly.  Follow.

## 2023-01-21 DIAGNOSIS — A414 Sepsis due to anaerobes: Secondary | ICD-10-CM | POA: Diagnosis not present

## 2023-01-21 DIAGNOSIS — K812 Acute cholecystitis with chronic cholecystitis: Secondary | ICD-10-CM | POA: Diagnosis not present

## 2023-01-21 DIAGNOSIS — E119 Type 2 diabetes mellitus without complications: Secondary | ICD-10-CM | POA: Diagnosis not present

## 2023-01-21 DIAGNOSIS — Z48815 Encounter for surgical aftercare following surgery on the digestive system: Secondary | ICD-10-CM | POA: Diagnosis not present

## 2023-01-21 DIAGNOSIS — G894 Chronic pain syndrome: Secondary | ICD-10-CM | POA: Diagnosis not present

## 2023-01-21 DIAGNOSIS — Z9049 Acquired absence of other specified parts of digestive tract: Secondary | ICD-10-CM | POA: Diagnosis not present

## 2023-01-21 DIAGNOSIS — K5710 Diverticulosis of small intestine without perforation or abscess without bleeding: Secondary | ICD-10-CM | POA: Diagnosis not present

## 2023-01-21 DIAGNOSIS — M549 Dorsalgia, unspecified: Secondary | ICD-10-CM | POA: Diagnosis not present

## 2023-01-21 DIAGNOSIS — I10 Essential (primary) hypertension: Secondary | ICD-10-CM | POA: Diagnosis not present

## 2023-01-22 DIAGNOSIS — G894 Chronic pain syndrome: Secondary | ICD-10-CM | POA: Diagnosis not present

## 2023-01-22 DIAGNOSIS — I10 Essential (primary) hypertension: Secondary | ICD-10-CM | POA: Diagnosis not present

## 2023-01-22 DIAGNOSIS — A414 Sepsis due to anaerobes: Secondary | ICD-10-CM | POA: Diagnosis not present

## 2023-01-22 DIAGNOSIS — M549 Dorsalgia, unspecified: Secondary | ICD-10-CM | POA: Diagnosis not present

## 2023-01-22 DIAGNOSIS — K5710 Diverticulosis of small intestine without perforation or abscess without bleeding: Secondary | ICD-10-CM | POA: Diagnosis not present

## 2023-01-22 DIAGNOSIS — Z48815 Encounter for surgical aftercare following surgery on the digestive system: Secondary | ICD-10-CM | POA: Diagnosis not present

## 2023-01-22 DIAGNOSIS — Z9049 Acquired absence of other specified parts of digestive tract: Secondary | ICD-10-CM | POA: Diagnosis not present

## 2023-01-22 DIAGNOSIS — K812 Acute cholecystitis with chronic cholecystitis: Secondary | ICD-10-CM | POA: Diagnosis not present

## 2023-01-22 DIAGNOSIS — E119 Type 2 diabetes mellitus without complications: Secondary | ICD-10-CM | POA: Diagnosis not present

## 2023-01-27 DIAGNOSIS — K5710 Diverticulosis of small intestine without perforation or abscess without bleeding: Secondary | ICD-10-CM | POA: Diagnosis not present

## 2023-01-27 DIAGNOSIS — I10 Essential (primary) hypertension: Secondary | ICD-10-CM | POA: Diagnosis not present

## 2023-01-27 DIAGNOSIS — K812 Acute cholecystitis with chronic cholecystitis: Secondary | ICD-10-CM | POA: Diagnosis not present

## 2023-01-27 DIAGNOSIS — A414 Sepsis due to anaerobes: Secondary | ICD-10-CM | POA: Diagnosis not present

## 2023-01-27 DIAGNOSIS — G894 Chronic pain syndrome: Secondary | ICD-10-CM | POA: Diagnosis not present

## 2023-01-27 DIAGNOSIS — Z48815 Encounter for surgical aftercare following surgery on the digestive system: Secondary | ICD-10-CM | POA: Diagnosis not present

## 2023-01-27 DIAGNOSIS — E119 Type 2 diabetes mellitus without complications: Secondary | ICD-10-CM | POA: Diagnosis not present

## 2023-01-27 DIAGNOSIS — M549 Dorsalgia, unspecified: Secondary | ICD-10-CM | POA: Diagnosis not present

## 2023-01-27 DIAGNOSIS — Z9049 Acquired absence of other specified parts of digestive tract: Secondary | ICD-10-CM | POA: Diagnosis not present

## 2023-02-04 DIAGNOSIS — G4733 Obstructive sleep apnea (adult) (pediatric): Secondary | ICD-10-CM | POA: Diagnosis not present

## 2023-02-04 DIAGNOSIS — Z48815 Encounter for surgical aftercare following surgery on the digestive system: Secondary | ICD-10-CM | POA: Diagnosis not present

## 2023-02-04 DIAGNOSIS — A414 Sepsis due to anaerobes: Secondary | ICD-10-CM | POA: Diagnosis not present

## 2023-02-04 DIAGNOSIS — G894 Chronic pain syndrome: Secondary | ICD-10-CM | POA: Diagnosis not present

## 2023-02-04 DIAGNOSIS — M1712 Unilateral primary osteoarthritis, left knee: Secondary | ICD-10-CM | POA: Diagnosis not present

## 2023-02-04 DIAGNOSIS — M549 Dorsalgia, unspecified: Secondary | ICD-10-CM | POA: Diagnosis not present

## 2023-02-04 DIAGNOSIS — K812 Acute cholecystitis with chronic cholecystitis: Secondary | ICD-10-CM | POA: Diagnosis not present

## 2023-02-04 DIAGNOSIS — Z9049 Acquired absence of other specified parts of digestive tract: Secondary | ICD-10-CM | POA: Diagnosis not present

## 2023-02-04 DIAGNOSIS — I10 Essential (primary) hypertension: Secondary | ICD-10-CM | POA: Diagnosis not present

## 2023-02-04 DIAGNOSIS — K5710 Diverticulosis of small intestine without perforation or abscess without bleeding: Secondary | ICD-10-CM | POA: Diagnosis not present

## 2023-02-04 DIAGNOSIS — E119 Type 2 diabetes mellitus without complications: Secondary | ICD-10-CM | POA: Diagnosis not present

## 2023-02-06 DIAGNOSIS — A414 Sepsis due to anaerobes: Secondary | ICD-10-CM | POA: Diagnosis not present

## 2023-02-06 DIAGNOSIS — M549 Dorsalgia, unspecified: Secondary | ICD-10-CM | POA: Diagnosis not present

## 2023-02-06 DIAGNOSIS — K812 Acute cholecystitis with chronic cholecystitis: Secondary | ICD-10-CM | POA: Diagnosis not present

## 2023-02-06 DIAGNOSIS — G894 Chronic pain syndrome: Secondary | ICD-10-CM | POA: Diagnosis not present

## 2023-02-06 DIAGNOSIS — Z48815 Encounter for surgical aftercare following surgery on the digestive system: Secondary | ICD-10-CM | POA: Diagnosis not present

## 2023-02-06 DIAGNOSIS — I10 Essential (primary) hypertension: Secondary | ICD-10-CM | POA: Diagnosis not present

## 2023-02-06 DIAGNOSIS — K5710 Diverticulosis of small intestine without perforation or abscess without bleeding: Secondary | ICD-10-CM | POA: Diagnosis not present

## 2023-02-06 DIAGNOSIS — Z9049 Acquired absence of other specified parts of digestive tract: Secondary | ICD-10-CM | POA: Diagnosis not present

## 2023-02-06 DIAGNOSIS — E119 Type 2 diabetes mellitus without complications: Secondary | ICD-10-CM | POA: Diagnosis not present

## 2023-02-09 DIAGNOSIS — I10 Essential (primary) hypertension: Secondary | ICD-10-CM | POA: Diagnosis not present

## 2023-02-09 DIAGNOSIS — K5710 Diverticulosis of small intestine without perforation or abscess without bleeding: Secondary | ICD-10-CM | POA: Diagnosis not present

## 2023-02-09 DIAGNOSIS — A414 Sepsis due to anaerobes: Secondary | ICD-10-CM | POA: Diagnosis not present

## 2023-02-09 DIAGNOSIS — Z9049 Acquired absence of other specified parts of digestive tract: Secondary | ICD-10-CM | POA: Diagnosis not present

## 2023-02-09 DIAGNOSIS — M549 Dorsalgia, unspecified: Secondary | ICD-10-CM | POA: Diagnosis not present

## 2023-02-09 DIAGNOSIS — K812 Acute cholecystitis with chronic cholecystitis: Secondary | ICD-10-CM | POA: Diagnosis not present

## 2023-02-09 DIAGNOSIS — G894 Chronic pain syndrome: Secondary | ICD-10-CM | POA: Diagnosis not present

## 2023-02-09 DIAGNOSIS — Z48815 Encounter for surgical aftercare following surgery on the digestive system: Secondary | ICD-10-CM | POA: Diagnosis not present

## 2023-02-09 DIAGNOSIS — E119 Type 2 diabetes mellitus without complications: Secondary | ICD-10-CM | POA: Diagnosis not present

## 2023-02-11 ENCOUNTER — Other Ambulatory Visit: Payer: Self-pay | Admitting: Internal Medicine

## 2023-02-12 NOTE — Telephone Encounter (Signed)
Rx ok'd for alprazolam #30 with no refills.

## 2023-02-18 DIAGNOSIS — M549 Dorsalgia, unspecified: Secondary | ICD-10-CM | POA: Diagnosis not present

## 2023-02-18 DIAGNOSIS — I10 Essential (primary) hypertension: Secondary | ICD-10-CM | POA: Diagnosis not present

## 2023-02-18 DIAGNOSIS — K812 Acute cholecystitis with chronic cholecystitis: Secondary | ICD-10-CM | POA: Diagnosis not present

## 2023-02-18 DIAGNOSIS — A414 Sepsis due to anaerobes: Secondary | ICD-10-CM | POA: Diagnosis not present

## 2023-02-18 DIAGNOSIS — Z9049 Acquired absence of other specified parts of digestive tract: Secondary | ICD-10-CM | POA: Diagnosis not present

## 2023-02-18 DIAGNOSIS — E119 Type 2 diabetes mellitus without complications: Secondary | ICD-10-CM | POA: Diagnosis not present

## 2023-02-18 DIAGNOSIS — Z48815 Encounter for surgical aftercare following surgery on the digestive system: Secondary | ICD-10-CM | POA: Diagnosis not present

## 2023-02-18 DIAGNOSIS — G894 Chronic pain syndrome: Secondary | ICD-10-CM | POA: Diagnosis not present

## 2023-02-18 DIAGNOSIS — K5710 Diverticulosis of small intestine without perforation or abscess without bleeding: Secondary | ICD-10-CM | POA: Diagnosis not present

## 2023-02-19 ENCOUNTER — Telehealth: Payer: Self-pay | Admitting: Internal Medicine

## 2023-02-19 NOTE — Telephone Encounter (Signed)
Home health certification plan of care. Called and wanted to know if we have received their orders for the patient. She said it needs to be signed by the provider and faxed back. Order number is  [id number].

## 2023-02-25 DIAGNOSIS — M549 Dorsalgia, unspecified: Secondary | ICD-10-CM | POA: Diagnosis not present

## 2023-02-25 DIAGNOSIS — Z48815 Encounter for surgical aftercare following surgery on the digestive system: Secondary | ICD-10-CM | POA: Diagnosis not present

## 2023-02-25 DIAGNOSIS — Z9049 Acquired absence of other specified parts of digestive tract: Secondary | ICD-10-CM | POA: Diagnosis not present

## 2023-02-25 DIAGNOSIS — A414 Sepsis due to anaerobes: Secondary | ICD-10-CM | POA: Diagnosis not present

## 2023-02-25 DIAGNOSIS — G894 Chronic pain syndrome: Secondary | ICD-10-CM | POA: Diagnosis not present

## 2023-02-25 DIAGNOSIS — G4733 Obstructive sleep apnea (adult) (pediatric): Secondary | ICD-10-CM

## 2023-02-25 DIAGNOSIS — E785 Hyperlipidemia, unspecified: Secondary | ICD-10-CM

## 2023-02-25 DIAGNOSIS — I10 Essential (primary) hypertension: Secondary | ICD-10-CM | POA: Diagnosis not present

## 2023-02-25 DIAGNOSIS — I480 Paroxysmal atrial fibrillation: Secondary | ICD-10-CM

## 2023-02-25 DIAGNOSIS — E119 Type 2 diabetes mellitus without complications: Secondary | ICD-10-CM | POA: Diagnosis not present

## 2023-02-25 DIAGNOSIS — K812 Acute cholecystitis with chronic cholecystitis: Secondary | ICD-10-CM | POA: Diagnosis not present

## 2023-02-25 DIAGNOSIS — K5710 Diverticulosis of small intestine without perforation or abscess without bleeding: Secondary | ICD-10-CM | POA: Diagnosis not present

## 2023-02-25 NOTE — Telephone Encounter (Signed)
Faxed to home health

## 2023-02-25 NOTE — Telephone Encounter (Signed)
FYI- spoke with [facility] rep. Patient is not being followed by surgery. He had surgery out of town and we did HFU with him. Plan of care was received for SN 1WK4 and PT- 1wk1, 2wk2, 1wk2.   Placed orders in your folder for signature. (You left me a note to clarify if we needed to sign.)

## 2023-02-25 NOTE — Telephone Encounter (Signed)
Signed and placed in box.

## 2023-02-26 ENCOUNTER — Other Ambulatory Visit: Payer: Self-pay | Admitting: *Deleted

## 2023-02-26 DIAGNOSIS — N138 Other obstructive and reflux uropathy: Secondary | ICD-10-CM

## 2023-02-26 DIAGNOSIS — E291 Testicular hypofunction: Secondary | ICD-10-CM

## 2023-02-27 ENCOUNTER — Other Ambulatory Visit
Admission: RE | Admit: 2023-02-27 | Discharge: 2023-02-27 | Disposition: A | Discharge status: Home / Self Care | Payer: Medicare PPO | Attending: Urology | Admitting: Urology

## 2023-02-27 ENCOUNTER — Ambulatory Visit: Payer: Medicare PPO | Admitting: Internal Medicine

## 2023-02-27 ENCOUNTER — Other Ambulatory Visit: Payer: Medicare PPO

## 2023-02-27 ENCOUNTER — Encounter: Payer: Self-pay | Admitting: Internal Medicine

## 2023-02-27 VITALS — BP 120/70 | HR 81 | Temp 98.2°F | Resp 16 | Ht 69.0 in | Wt 179.2 lb

## 2023-02-27 DIAGNOSIS — K219 Gastro-esophageal reflux disease without esophagitis: Secondary | ICD-10-CM

## 2023-02-27 DIAGNOSIS — F439 Reaction to severe stress, unspecified: Secondary | ICD-10-CM

## 2023-02-27 DIAGNOSIS — N401 Enlarged prostate with lower urinary tract symptoms: Secondary | ICD-10-CM | POA: Diagnosis not present

## 2023-02-27 DIAGNOSIS — N138 Other obstructive and reflux uropathy: Secondary | ICD-10-CM | POA: Insufficient documentation

## 2023-02-27 DIAGNOSIS — G4733 Obstructive sleep apnea (adult) (pediatric): Secondary | ICD-10-CM

## 2023-02-27 DIAGNOSIS — N5089 Other specified disorders of the male genital organs: Secondary | ICD-10-CM

## 2023-02-27 DIAGNOSIS — F419 Anxiety disorder, unspecified: Secondary | ICD-10-CM

## 2023-02-27 DIAGNOSIS — E7800 Pure hypercholesterolemia, unspecified: Secondary | ICD-10-CM | POA: Diagnosis not present

## 2023-02-27 DIAGNOSIS — I10 Essential (primary) hypertension: Secondary | ICD-10-CM

## 2023-02-27 DIAGNOSIS — E291 Testicular hypofunction: Secondary | ICD-10-CM | POA: Insufficient documentation

## 2023-02-27 DIAGNOSIS — E041 Nontoxic single thyroid nodule: Secondary | ICD-10-CM | POA: Diagnosis not present

## 2023-02-27 DIAGNOSIS — I2510 Atherosclerotic heart disease of native coronary artery without angina pectoris: Secondary | ICD-10-CM | POA: Diagnosis not present

## 2023-02-27 DIAGNOSIS — E1159 Type 2 diabetes mellitus with other circulatory complications: Secondary | ICD-10-CM

## 2023-02-27 DIAGNOSIS — I480 Paroxysmal atrial fibrillation: Secondary | ICD-10-CM | POA: Diagnosis not present

## 2023-02-27 DIAGNOSIS — I700 Atherosclerosis of aorta: Secondary | ICD-10-CM

## 2023-02-27 LAB — HEMOGLOBIN AND HEMATOCRIT, BLOOD
HCT: 40.7 % (ref 39.0–52.0)
Hemoglobin: 13 g/dL (ref 13.0–17.0)

## 2023-02-27 LAB — HM DIABETES FOOT EXAM

## 2023-02-27 NOTE — Progress Notes (Signed)
Subjective:    Patient ID: Ricardo Nash, male    DOB: [birth year], 77 y.o.   MRN: [id number]  Patient here for  Chief Complaint  Patient presents with   Medical Management of Chronic Issues    HPI Here for a scheduled follow up.  Here to follow up regarding hypertension, diabetes, afib and hypercholesterolemia - and hospital follow up. Admitted 01/01/23 - 01/04/23.  Has known CAD - on aspirin and plavix. Recently admitted with gallbladder issues [facility].  S/p cholecystectomy with intra op cholangiography that revealed filling defect. Unable to do ERCP at [facility] and he was transferred to [facility] in [place] - successful ERCP.  During the hospitalization at [facility] - became septic.  Blood culture positive for kelebsiella.  Treated with levaquin.  Successful ERCP 8/23 - with removal of stone. Recommended home PT/OT. Completed PT.  Is exercising.  Feeling better. No chest pain or sob reported.  No increased cough.  No abdominal pain.  Still some loose stools occasionally, but doing better.  Overall feeling better.  States am sugars averaging <130 and pm sugars <180.  Blood pressures averaging 120/70. Does report left testicle - ascended.  Noticed this past week.  No pain.  Has appt with urology next week.    Past Medical History:  Diagnosis Date   C. difficile colitis    Coronary artery disease    Depression    Diabetes mellitus (HCC)    Diverticulitis    Diverticulosis    Dysrhythmia    PAF   Environmental allergies    GERD (gastroesophageal reflux disease)    Goiter    intrathoracic, s/p benign biopsy (Dr Gerrit Friends)   Hypercholesterolemia    Hypertension    Osteoarthritis    cervical spine, lumbar spine   Paroxysmal atrial fibrillation (HCC)    Pneumonia    PONV (postoperative nausea and vomiting)    Sleep apnea    CPAP @ NIGHT   Stroke [facility])    TIA (transient ischemic attack)    Past Surgical History:  Procedure Laterality Date   BACK SURGERY  8/08    s/p fusion of L4-L5   capsule endoscopy     CATARACT EXTRACTION  2011   Dr. Elmer Picker   CERVICAL DISC SURGERY  2002   COLONOSCOPY WITH PROPOFOL N/A 07/20/2017   Procedure: COLONOSCOPY WITH PROPOFOL;  Surgeon: Scot Jun, MD;  Location: [facility] ENDOSCOPY;  Service: Endoscopy;  Laterality: N/A;   EYE SURGERY     cataract bilateral 10/1999   eyelid reduction     Dr. Beryle Flock HERNIA REPAIR  1991   Dr, Tobie Poet LAMINECTOMY/DECOMPRESSION MICRODISCECTOMY N/A 10/15/2020   Procedure: Laminectomy - Lumbar Two-Lumbar Three - Lumbar Three-Lumbar Four with sublaminar decompression;  Surgeon: Tia Alert, MD;  Location: [facility] OR;  Service: Neurosurgery;  Laterality: N/A;  Laminectomy - Lumbar Two-Lumbar Three - Lumbar Three-Lumbar Four with sublaminar decompression   NOSE SURGERY     turbinate reduction   SEPTOPLASTY  1975   SHOULDER SURGERY  2000   rotator cuff   Family History  Problem Relation Age of Onset   Congestive Heart Failure Father    Heart disease Father        myocardial infarction   Rheumatic fever Father        valvular disease   Heart disease Mother        s/p CABG (age 37)   Kidney disease Sister    Colon cancer  Neg Hx    Prostate cancer Neg Hx    Social History   Socioeconomic History   Marital status: Married    Spouse name: Not on file   Number of children: 2   Years of education: Not on file   Highest education level: Not on file  Occupational History   Occupation: retired Runner, broadcasting/film/video  Tobacco Use   Smoking status: Former   Smokeless tobacco: Never  Vaping Use   Vaping status: Never Used  Substance and Sexual Activity   Alcohol use: Yes    Alcohol/week: 0.0 standard drinks of alcohol    Comment: occasional   Drug use: No   Sexual activity: Not Currently  Other Topics Concern   Not on file  Social History Narrative   Not on file   Social Determinants of Health   Financial Resource Strain: Low Risk  (11/07/2022)   Received from [facility]   Overall Financial Resource Strain (CARDIA)    Difficulty of Paying Living Expenses: Not hard at all  Food Insecurity: No Food Insecurity (01/06/2023)   Hunger Vital Sign    Worried About Running Out of Food in the Last Year: Never true    Ran Out of Food in the Last Year: Never true  Transportation Needs: No Transportation Needs (01/06/2023)   PRAPARE - Administrator, Civil Service (Medical): No    Lack of Transportation (Non-Medical): No  Physical Activity: Insufficiently Active (06/30/2022)   Exercise Vital Sign    Days of Exercise per Week: 2 days    Minutes of Exercise per Session: 60 min  Stress: No Stress Concern Present (06/30/2022)   [employer] of Occupational Health - Occupational Stress Questionnaire    Feeling of Stress : Only a little  Social Connections: Unknown (06/30/2022)   Social Connection and Isolation Panel [NHANES]    Frequency of Communication with Friends and Family: Not on file    Frequency of Social Gatherings with Friends and Family: Not on file    Attends Religious Services: Not on file    Active Member of Clubs or Organizations: Not on file    Attends Banker Meetings: Not on file    Marital Status: Married     Review of Systems  Constitutional:  Negative for appetite change and unexpected weight change.  HENT:  Negative for congestion and sinus pressure.   Respiratory:  Negative for cough, chest tightness and shortness of breath.   Cardiovascular:  Negative for chest pain, palpitations and leg swelling.  Gastrointestinal:  Negative for abdominal pain, diarrhea, nausea and vomiting.  Genitourinary:  Negative for difficulty urinating and dysuria.  Musculoskeletal:  Negative for joint swelling and myalgias.       Back / leg pain better.   Skin:  Negative for color change and rash.  Neurological:  Negative for dizziness and headaches.  Psychiatric/Behavioral:  Negative for  agitation and dysphoric mood.        Objective:     BP 120/70   Pulse 81   Temp 98.2 F (36.8 C)   Resp 16   Ht 5\' 9"  (1.753 m)   Wt 179 lb 3.2 oz (81.3 kg)   SpO2 98%   BMI 26.46 kg/m  Wt Readings from Last 3 Encounters:  02/27/23 179 lb 3.2 oz (81.3 kg)  01/15/23 180 lb 9.6 oz (81.9 kg)  10/13/22 193 lb 9.6 oz (87.8 kg)    Physical Exam Vitals reviewed.  Constitutional:      General: He is not in acute distress.    Appearance: Normal appearance. He is well-developed.  HENT:     Head: Normocephalic and atraumatic.     Right Ear: External ear normal.     Left Ear: External ear normal.  Eyes:     General: No scleral icterus.       Right eye: No discharge.        Left eye: No discharge.     Conjunctiva/sclera: Conjunctivae normal.  Cardiovascular:     Rate and Rhythm: Normal rate and regular rhythm.  Pulmonary:     Effort: Pulmonary effort is normal. No respiratory distress.     Breath sounds: Normal breath sounds.  Abdominal:     General: Bowel sounds are normal.     Palpations: Abdomen is soft.     Tenderness: There is no abdominal tenderness.  Musculoskeletal:        General: No swelling or tenderness.     Cervical back: Neck supple. No tenderness.  Lymphadenopathy:     Cervical: No cervical adenopathy.  Skin:    Findings: No erythema or rash.  Neurological:     Mental Status: He is alert.  Psychiatric:        Mood and Affect: Mood normal.        Behavior: Behavior normal.      Outpatient Encounter Medications as of 02/27/2023  Medication Sig   ACCU-CHEK GUIDE test strip USE AS DIRECTED TO TEST BLOOD GLUCOSE LEVELS TWICE DAILY   ALPRAZolam (XANAX) 0.25 MG tablet TAKE 1 TABLET BY MOUTH ONCE DAILY AS NEEDED   amLODipine (NORVASC) 10 MG tablet Take 1 tablet (10 mg total) by mouth daily.   Ascorbic Acid (VITAMIN C) 100 MG tablet Take 100 mg by mouth in the morning.   aspirin EC 81 MG tablet Take 81 mg by mouth every evening. Swallow whole.    atorvastatin (LIPITOR) 20 MG tablet TAKE 1 TABLET BY MOUTH ONCE EVERY EVENING   Azelastine-Fluticasone 137-50 MCG/ACT SUSP PLACE 1 SPRAY INTO EACH NOSTRIL ONCE EVERY MORNING AND AT BEDTIME   blood glucose meter kit and supplies Dispense based on patient and insurance preference. Use up to four times daily as directed. (FOR ICD-10 E10.9, E11.9).   carvedilol (COREG) 25 MG tablet TAKE 1 TABLET BY MOUTH TWICE DAILY WITH MEALS   docusate sodium (COLACE) 100 MG capsule Take 100 mg by mouth daily.   Dulaglutide (TRULICITY) 3 MG/0.5ML SOPN Inject 3 mg as directed once a week.   levocetirizine (XYZAL) 5 MG tablet TAKE 1 TABLET BY MOUTH ONCE EVERY EVENING   lisinopril (ZESTRIL) 40 MG tablet TAKE 1 TABLET BY MOUTH ONCE DAILY   meloxicam (MOBIC) 15 MG tablet Take 15 mg by mouth daily.   metFORMIN (GLUCOPHAGE) 1000 MG tablet Take 1 tablet (1,000 mg total) by mouth 2 (two) times daily with a meal.   montelukast (SINGULAIR) 10 MG tablet TAKE 1 TABLET BY MOUTH AT BEDTIME   Multiple Vitamin (MULTIVITAMIN WITH MINERALS) TABS tablet Take 1 tablet by mouth in the morning.   ondansetron (ZOFRAN) 4 MG tablet Take 1 tablet (4 mg total) by mouth every 8 (eight) hours as needed for nausea or vomiting.   oxyCODONE (OXY IR/ROXICODONE) 5 MG immediate release tablet Take 5 mg by mouth 3 (three) times daily as needed.   pantoprazole (PROTONIX) 40 MG tablet Take 1 tablet (40 mg total) by mouth daily.   pioglitazone (ACTOS) 15 MG tablet Take 1  tablet (15 mg total) by mouth daily.   pregabalin (LYRICA) 100 MG capsule Take 100 mg by mouth 2 (two) times daily.   sertraline (ZOLOFT) 100 MG tablet TAKE 1 TABLET BY MOUTH ONCE DAILY   tamsulosin (FLOMAX) 0.4 MG CAPS capsule TAKE 2 CAPSULES BY MOUTH ONCE DAILY AFTER SUPPER   testosterone cypionate (DEPOTESTOSTERONE CYPIONATE) 200 MG/ML injection INJECT INTRAMUSCULARLY EVERY 14 DAYS   traZODone (DESYREL) 50 MG tablet TAKE 1 and 1/2 TABLETS BY MOUTH AT BEDTIME    triamterene-hydrochlorothiazide (MAXZIDE-25) 37.5-25 MG tablet TAKE 1 TABLET BY MOUTH ONCE DAILY   No facility-administered encounter medications on file as of 02/27/2023.     Lab Results  Component Value Date   WBC 8.6 10/08/2022   HGB 13.0 02/27/2023   HCT 40.7 02/27/2023   PLT 275.0 10/08/2022   GLUCOSE 119 (H) 01/13/2023   CHOL 77 01/13/2023   TRIG 195.0 (H) 01/13/2023   HDL 24.60 (L) 01/13/2023   LDLDIRECT 72.0 10/08/2022   LDLCALC 14 01/13/2023   ALT 14 01/13/2023   AST 16 01/13/2023   NA 141 01/13/2023   K 3.9 01/13/2023   CL 103 01/13/2023   CREATININE 1.04 01/13/2023   BUN 16 01/13/2023   CO2 27 01/13/2023   TSH 0.89 10/08/2022   PSA 0.82 06/10/2019   INR 0.9 10/11/2020   HGBA1C 7.0 (H) 01/13/2023   MICROALBUR 3.2 (H) 02/06/2022    [place] FNA BX THYROID 1ST LESION AFIRMA  Result Date: 12/18/2021 INDICATION: Left superior thyroid nodule 4.0 cm EXAM: ULTRASOUND GUIDED FINE NEEDLE ASPIRATION OF INDETERMINATE THYROID NODULE COMPARISON:  US Thyroid 11/22/21 Previous biopsies of nodule #2 and nodule #8 02/2008:  Benign MEDICATIONS: 10 cc 1% lidocaine COMPLICATIONS: None immediate. TECHNIQUE: Informed written consent was obtained from the patient after a discussion of the risks, benefits and alternatives to treatment. Questions regarding the procedure were encouraged and answered. A timeout was performed prior to the initiation of the procedure. Pre-procedural ultrasound scanning demonstrated unchanged size and appearance of the indeterminate nodule within the left thyroid The procedure was planned. The neck was prepped in the usual sterile fashion, and a sterile drape was applied covering the operative field. A timeout was performed prior to the initiation of the procedure. Local anesthesia was provided with 1% lidocaine. Under direct ultrasound guidance, 5 FNA biopsies were performed of the left superior thyroid nodule with a 27 gauge needle. 2 samples were obtained for [facility]  Multiple ultrasound images were saved for procedural documentation purposes. The samples were prepared and submitted to pathology. Limited post procedural scanning was negative for hematoma or additional complication. Dressings were placed. The patient tolerated the above procedures procedure well without immediate postprocedural complication. FINDINGS: Nodule reference number based on prior diagnostic ultrasound: 5 Maximum size: 4.0 cm Location: Left; Superior ACR TI-RADS risk category: TR3 (3 points) Reason for biopsy: meets ACR TI-RADS criteria Ultrasound imaging confirms appropriate placement of the needles within the thyroid nodule. IMPRESSION: Technically successful ultrasound guided fine needle aspiration of left superior thyroid nodule Read by Robet Leu [facility] Electronically Signed   By: Irish Lack M.D.   On: 12/18/2021 16:12       Assessment & Plan:  Stress Assessment & Plan: Continue zoloft. Has good support.  Follow.  Doing better.    Paroxysmal atrial fibrillation (HCC) Assessment & Plan: Previously noted to be in afib when sick in hospital.  Prior to this most recent hospitalization he saw a new cardiologist.  She recommended starting eliquis.  He is hesitant to start.  Was told by cardiology previously - did not need.  Reports had isolated afib when sick in hospital.  He is on aspirin.  No increased heart rate or palpitations.  Breathing stable. Discussed f/u with cardiology.  Request to see cardiology through [facility].  Has scheduled appt. Prefers to remain on aspirin only.    Obstructive sleep apnea Assessment & Plan: Continue cpap.    Nontoxic uninodular goiter Assessment & Plan: Saw Dr Gerrit Friends:  (7/023) -Overall the USN is relatively stable.  There is a nodule on the left that has increased in size and will require biopsy. Tresa Endo - please order USN guided FNA biopsy of nodule.  12/24/21 - Biopsy is benign.  Changes in the USN year-to-year were not significant.  Plan was to  see him back in one year with a repeat USN and TSH level.   Primary hypertension Assessment & Plan: Continue carvedilol and amlodipine.  Follow pressures.  Follow metabolic panel.    Hypercholesterolemia Assessment & Plan: Continue lipitor.  Low cholesterol diet and exercise.  Follow lipid panel and liver function tests.     Gastroesophageal reflux disease, unspecified whether esophagitis present Assessment & Plan: No upper symptoms reported.  Continue protonix.    Type 2 diabetes mellitus with other circulatory complication, without long-term current use of insulin (HCC) Assessment & Plan: Continue diet and exercise.  Follow met b and a1c. Low carb diet and exercise.  Sugars as outlined.    Coronary artery disease involving native coronary artery of native heart without angina pectoris Assessment & Plan: Continue aspirin, lipitor and carvedilol.  No chest pain. Continue risk factor modification.    Aortic atherosclerosis (HCC) Assessment & Plan: Continues on lipitor.    Anxiety Assessment & Plan: On zoloft.  Follow.  Stable.  Uses xanax prn.    Ascended testicle Assessment & Plan: Noticed this past week - left testicle.  No pain.  Offered exam. Reported has appt with urology next week. Wanted to hold.        Dale [place], MD

## 2023-02-27 NOTE — Assessment & Plan Note (Signed)
Continue lipitor.  Low cholesterol diet and exercise.  Follow lipid panel and liver function tests.   

## 2023-02-27 NOTE — Assessment & Plan Note (Signed)
No upper symptoms reported.  Continue protonix.  

## 2023-02-27 NOTE — Assessment & Plan Note (Signed)
Continue zoloft. Has good support.  Follow.  Doing better.

## 2023-02-27 NOTE — Assessment & Plan Note (Addendum)
Continue aspirin, lipitor and carvedilol.  No chest pain. Continue risk factor modification.

## 2023-02-27 NOTE — Assessment & Plan Note (Signed)
Previously noted to be in afib when sick in hospital.  Prior to this most recent hospitalization he saw a new cardiologist.  She recommended starting eliquis.  He is hesitant to start.  Was told by cardiology previously - did not need.  Reports had isolated afib when sick in hospital.  He is on aspirin.  No increased heart rate or palpitations.  Breathing stable. Discussed f/u with cardiology.  Request to see cardiology through [facility].  Has scheduled appt. Prefers to remain on aspirin only.

## 2023-02-27 NOTE — Addendum Note (Signed)
Addended by: Yehuda Savannah on: 02/27/2023 09:33 AM   Modules accepted: Orders

## 2023-02-27 NOTE — Assessment & Plan Note (Signed)
Noticed this past week - left testicle.  No pain.  Offered exam. Reported has appt with urology next week. Wanted to hold.

## 2023-02-27 NOTE — Assessment & Plan Note (Signed)
Continue cpap.  

## 2023-02-27 NOTE — Assessment & Plan Note (Signed)
Continues on lipitor.  

## 2023-02-27 NOTE — Assessment & Plan Note (Signed)
Continue diet and exercise.  Follow met b and a1c. Low carb diet and exercise.  Sugars as outlined.

## 2023-02-27 NOTE — Addendum Note (Signed)
Addended by: Lonell Face C on: 02/27/2023 09:34 AM   Modules accepted: Orders

## 2023-02-27 NOTE — Assessment & Plan Note (Signed)
Continue carvedilol and amlodipine.  Follow pressures.  Follow metabolic panel.

## 2023-02-27 NOTE — Assessment & Plan Note (Addendum)
On zoloft.  Follow.  Stable.  Uses xanax prn.

## 2023-02-27 NOTE — Assessment & Plan Note (Signed)
Saw Dr Gerrit Friends:  (7/023) -Overall the USN is relatively stable.  There is a nodule on the left that has increased in size and will require biopsy. [facility] End[facility] - please order USN guided FNA biopsy of nodule.  12/24/21 - Biopsy is benign.  Changes in the USN year-to-year were not significant.  Plan was to see him back in one year with a repeat USN and TSH level.

## 2023-02-28 LAB — TESTOSTERONE: Testosterone: 461 ng/dL (ref 264–916)

## 2023-03-03 ENCOUNTER — Other Ambulatory Visit: Payer: Self-pay | Admitting: Internal Medicine

## 2023-03-04 ENCOUNTER — Encounter: Payer: Self-pay | Admitting: Urology

## 2023-03-04 ENCOUNTER — Ambulatory Visit: Payer: Medicare PPO | Admitting: Urology

## 2023-03-04 VITALS — BP 131/54 | HR 81 | Ht 69.0 in | Wt 179.0 lb

## 2023-03-04 DIAGNOSIS — N138 Other obstructive and reflux uropathy: Secondary | ICD-10-CM

## 2023-03-04 DIAGNOSIS — N401 Enlarged prostate with lower urinary tract symptoms: Secondary | ICD-10-CM

## 2023-03-04 DIAGNOSIS — E291 Testicular hypofunction: Secondary | ICD-10-CM | POA: Diagnosis not present

## 2023-03-04 LAB — BLADDER SCAN AMB NON-IMAGING

## 2023-03-04 MED ORDER — TAMSULOSIN HCL 0.4 MG PO CAPS: 0.8000 mg | ORAL_CAPSULE | Freq: Every day | ORAL | 3 refills | Status: DC

## 2023-03-04 MED ORDER — TESTOSTERONE CYPIONATE 200 MG/ML IM SOLN: 100.0000 mg | INTRAMUSCULAR | 1 refills | Status: DC

## 2023-03-04 NOTE — Progress Notes (Signed)
03/04/2023 12:20 PM   Osha [facility] November 06, 1945 [id number]  Reason for visit: Follow up low testosterone, ED, urinary symptoms/BPH/OAB  HPI: Comorbid 77 year old male previously followed at [facility] urology for the above issues.  Comorbidities include sleep apnea, diabetes(recent hemoglobin A1c 7), hypertension, depression.  He has been on testosterone injections every 2 weeks long-term and PSA has remained low, ~1.  He reports significant improvement on the testosterone and his mood, energy, and overall quality of life.  He has failed PDE 5 inhibitors and injections with Trimix for ED, and he is no longer interested in pursuing treatment option for ED. He is on Flomax 0.8 mg nightly which he feels improves his urgency/frequency and weak stream, has nocturia 0-1 time overnight.    He had been on testosterone 200 mg every 14 days, and levels have been around 100 which were improved from values of less than 50 prior to starting testosterone and he was happy at those numbers.  Over the last year he has had significant weight loss and lifestyle changes, most recent testosterone increase significantly to 461 on same dose of testosterone.  We discussed this is likely related to his weight loss and improve diabetes control, and improved sleep.  He does report worsening urinary symptoms over the last year.  PVR today is elevated at .  We discussed the relationship between testosterone and BPH, and this may be contributing to his urinary symptoms.  I offered cystoscopy/TRUS for further evaluation and consideration of outlet procedures but he deferred.  He was interested in decreasing the testosterone dose to 100 mg every 14 days and reassessing the urinary symptoms and repeat PVR in the next 2 to 3 months.  -Flowmax 0.8 mg refilled -Testosterone dose decreased to 100 mg every 14 days -RTC 3 months repeat testosterone(1 week after injection), and repeat PVR.  If PVR remains elevated would again  strongly recommend cystoscopy/TRUS and consideration of outlet procedures   Sondra Come, MD  [facility] [address], [address] [place], [place] [address] [phone]

## 2023-03-04 NOTE — Patient Instructions (Signed)
We decreased your testosterone dose to 100 mg every 14 days.  You should have your testosterone checked in the morning before 10 AM 7 days after an injection about 3 months from now.  Hopefully this will improve your urinary symptoms, but if things get worse please reach out to [place].

## 2023-03-12 ENCOUNTER — Other Ambulatory Visit: Payer: Self-pay | Admitting: Internal Medicine

## 2023-03-13 NOTE — Telephone Encounter (Signed)
Rx ok'd for alprazolam #30 with no refills.  Was sent request for trazodone.  Need to clarify if he is actually taking and if so, dose taking.

## 2023-03-16 ENCOUNTER — Encounter: Payer: Self-pay | Admitting: *Deleted

## 2023-03-16 MED ORDER — TRAZODONE HCL 50 MG PO TABS: 50.0000 mg | ORAL_TABLET | Freq: Every evening | ORAL | 1 refills | Status: DC | PRN

## 2023-03-16 NOTE — Telephone Encounter (Signed)
Rx ok'd for trazodone #90 with one refill.  

## 2023-03-20 NOTE — Telephone Encounter (Signed)
Error

## 2023-03-30 ENCOUNTER — Ambulatory Visit: Payer: Medicare PPO | Attending: Cardiology | Admitting: Cardiology

## 2023-03-30 ENCOUNTER — Ambulatory Visit: Payer: Medicare PPO

## 2023-03-30 ENCOUNTER — Encounter: Payer: Self-pay | Admitting: Cardiology

## 2023-03-30 VITALS — BP 134/60 | HR 82 | Ht 69.0 in | Wt 179.6 lb

## 2023-03-30 DIAGNOSIS — I4891 Unspecified atrial fibrillation: Secondary | ICD-10-CM

## 2023-03-30 DIAGNOSIS — I480 Paroxysmal atrial fibrillation: Secondary | ICD-10-CM

## 2023-03-30 DIAGNOSIS — I10 Essential (primary) hypertension: Secondary | ICD-10-CM

## 2023-03-30 DIAGNOSIS — E782 Mixed hyperlipidemia: Secondary | ICD-10-CM

## 2023-03-30 NOTE — Patient Instructions (Signed)
Medication Instructions:   Your physician recommends that you continue on your current medications as directed. Please refer to the Current Medication list given to you today.  *If you need a refill on your cardiac medications before your next appointment, please call your pharmacy*   Lab Work:  None Ordered  If you have labs (blood work) drawn today and your tests are completely normal, you will receive your results only by: MyChart Message (if you have MyChart) OR A paper copy in the mail If you have any lab test that is abnormal or we need to change your treatment, we will call you to review the results.   Testing/Procedures:  Your physician has requested that you have an echocardiogram - in 2025 . Echocardiography is a painless test that uses sound waves to create images of your heart. It provides your doctor with information about the size and shape of your heart and how well your heart's chambers and valves are working. This procedure takes approximately one hour. There are no restrictions for this procedure. Please do NOT wear cologne, perfume, aftershave, or lotions (deodorant is allowed). Please arrive 15 minutes prior to your appointment time.  Please note: We ask at that you not bring children with you during ultrasound (echo/ vascular) testing. Due to room size and safety concerns, children are not allowed in the ultrasound rooms during exams. Our front office staff cannot provide observation of children in our lobby area while testing is being conducted. An adult accompanying a patient to their appointment will only be allowed in the ultrasound room at the discretion of the ultrasound technician under special circumstances. We apologize for any inconvenience.  Your physician has recommended that you wear a Zio monitor.   This monitor is a medical device that records the heart's electrical activity. Doctors most often use these monitors to diagnose arrhythmias. Arrhythmias are  problems with the speed or rhythm of the heartbeat. The monitor is a small device applied to your chest. You can wear one while you do your normal daily activities. While wearing this monitor if you have any symptoms to push the button and record what you felt. Once you have worn this monitor for the period of time provider prescribed (Usually 14 days), you will return the monitor device in the postage paid box. Once it is returned they will download the data collected and provide [place] with a report which the provider will then review and we will call you with those results. Important tips:  Avoid showering during the first 24 hours of wearing the monitor. Avoid excessive sweating to help maximize wear time. Do not submerge the device, no hot tubs, and no swimming pools. Keep any lotions or oils away from the patch. After 24 hours you may shower with the patch on. Take brief showers with your back facing the shower head.  Do not remove patch once it has been placed because that will interrupt data and decrease adhesive wear time. Push the button when you have any symptoms and write down what you were feeling. Once you have completed wearing your monitor, remove and place into box which has postage paid and place in your outgoing mailbox.  If for some reason you have misplaced your box then call our office and we can provide another box and/or mail it off for you.   Follow-Up: At [facility], you and your health needs are our priority.  As part of our continuing mission to provide you with exceptional  heart care, we have created designated Provider Care Teams.  These Care Teams include your primary Cardiologist (physician) and Advanced Practice Providers (APPs -  Physician Assistants and Nurse Practitioners) who all work together to provide you with the care you need, when you need it.  We recommend signing up for the patient portal called "MyChart".  Sign up information is provided on this  After Visit Summary.  MyChart is used to connect with patients for Virtual Visits (Telemedicine).  Patients are able to view lab/test results, encounter notes, upcoming appointments, etc.  Non-urgent messages can be sent to your provider as well.   To learn more about what you can do with MyChart, go to [email or web address].    Your next appointment:   3 month(s)  Provider:   You may see Debbe Odea, MD or one of the following Advanced Practice Providers on your designated Care Team:   Nicolasa Ducking, NP Eula Listen, PA-C Cadence Fransico Michael, PA-C Charlsie Quest, NP Carlos Levering, NP

## 2023-03-30 NOTE — Progress Notes (Signed)
Cardiology Office Note:    Date:  03/30/2023   ID:  Ricardo Nash, DOB 1945/07/11, MRN [id number]  PCP:  Dale [place], MD   [facility] Providers Cardiologist:  Debbe Odea, MD     Referring MD: Dale [place], MD   Chief Complaint  Patient presents with   New Patient (Initial Visit)    Referred for cardiac evaluation/follow up of Paroxysmal atrial fibrillation.  Previously followed by [facility] Cardiology.      History of Present Illness:    Ricardo Nash is a 77 y.o. male with a hx of hypertension, atrial fibrillation, diabetes who presents due to A-fib.  Deviously seen by [facility] cardiology from a cardiac perspective.  Patient had an episode of atrial fibrillation in 2002, diagnosed in the hospital after presenting with nosebleeds while recovering from neck surgery.  He was seen by [facility] cardiology/Dr. Lady Gary at the time, started on Plavix, eventually switched to baby aspirin.  Has not had any episodes over the past 20 years.  Denies palpitations, denies dizziness or syncope.  Has been eating healthier, lost about 50 pounds over the past year or so.  Blood pressures, cholesterol and blood sugars much better controlled.  Recently seen by [facility] cardiology, Eliquis suggested, patient wanted a second opinion.  Echo 06/2021 EF 40 to 45% reported.  Reviewed by myself roughly 50%.  Septal bounce, consistent with bundle branch block. Exercise MPI 12/2017 no evidence for ischemia.  Past Medical History:  Diagnosis Date   C. difficile colitis    Coronary artery disease    Depression    Diabetes mellitus (HCC)    Diverticulitis    Diverticulosis    Dysrhythmia    PAF   Environmental allergies    GERD (gastroesophageal reflux disease)    Goiter    intrathoracic, s/p benign biopsy (Dr Gerrit Friends)   Hypercholesterolemia    Hypertension    Osteoarthritis    cervical spine, lumbar spine   Paroxysmal atrial fibrillation (HCC)    Pneumonia    PONV (postoperative nausea and  vomiting)    Sleep apnea    CPAP @ NIGHT   Stroke [facility])    TIA (transient ischemic attack)     Past Surgical History:  Procedure Laterality Date   BACK SURGERY  12/2006   s/p fusion of L4-L5   BACK SURGERY  2024   capsule endoscopy     CATARACT EXTRACTION  2011   Dr. Elmer Picker   CERVICAL DISC SURGERY  2002   CHOLECYSTECTOMY  2024   COLONOSCOPY WITH PROPOFOL N/A 07/20/2017   Procedure: COLONOSCOPY WITH PROPOFOL;  Surgeon: Scot Jun, MD;  Location: [facility] ENDOSCOPY;  Service: Endoscopy;  Laterality: N/A;   EYE SURGERY     cataract bilateral 10/1999   eyelid reduction     Dr. Beryle Flock HERNIA REPAIR  1991   Dr, Tobie Poet LAMINECTOMY/DECOMPRESSION MICRODISCECTOMY N/A 10/15/2020   Procedure: Laminectomy - Lumbar Two-Lumbar Three - Lumbar Three-Lumbar Four with sublaminar decompression;  Surgeon: Tia Alert, MD;  Location: [facility] OR;  Service: Neurosurgery;  Laterality: N/A;  Laminectomy - Lumbar Two-Lumbar Three - Lumbar Three-Lumbar Four with sublaminar decompression   NOSE SURGERY     turbinate reduction   SEPTOPLASTY  1975   SHOULDER SURGERY  2000   rotator cuff    Current Medications: Current Meds  Medication Sig   ACCU-CHEK GUIDE test strip USE AS DIRECTED TO TEST BLOOD GLUCOSE LEVELS TWICE DAILY   ALPRAZolam (XANAX) 0.25  MG tablet TAKE 1 TABLET BY MOUTH ONCE DAILY AS NEEDED   amLODipine (NORVASC) 10 MG tablet Take 1 tablet (10 mg total) by mouth daily.   Ascorbic Acid (VITAMIN C) 100 MG tablet Take 100 mg by mouth in the morning.   aspirin EC 81 MG tablet Take 81 mg by mouth every evening. Swallow whole.   atorvastatin (LIPITOR) 20 MG tablet TAKE 1 TABLET BY MOUTH ONCE EVERY EVENING   Azelastine-Fluticasone 137-50 MCG/ACT SUSP PLACE 1 SPRAY INTO EACH NOSTRIL ONCE EVERY MORNING AND AT BEDTIME   blood glucose meter kit and supplies Dispense based on patient and insurance preference. Use up to four times daily as directed. (FOR ICD-10 E10.9, E11.9).    carvedilol (COREG) 25 MG tablet TAKE 1 TABLET BY MOUTH TWICE DAILY WITH MEALS   docusate sodium (COLACE) 100 MG capsule Take 100 mg by mouth daily.   Dulaglutide (TRULICITY) 3 MG/0.5ML SOPN Inject 3 mg as directed once a week.   levocetirizine (XYZAL) 5 MG tablet TAKE 1 TABLET BY MOUTH ONCE EVERY EVENING   lisinopril (ZESTRIL) 40 MG tablet TAKE 1 TABLET BY MOUTH ONCE DAILY   metFORMIN (GLUCOPHAGE) 1000 MG tablet Take 1 tablet (1,000 mg total) by mouth 2 (two) times daily with a meal.   montelukast (SINGULAIR) 10 MG tablet TAKE 1 TABLET BY MOUTH AT BEDTIME   Multiple Vitamin (MULTIVITAMIN WITH MINERALS) TABS tablet Take 1 tablet by mouth in the morning.   ondansetron (ZOFRAN) 4 MG tablet Take 1 tablet (4 mg total) by mouth every 8 (eight) hours as needed for nausea or vomiting.   pantoprazole (PROTONIX) 40 MG tablet TAKE 1 TABLET BY MOUTH ONCE DAILY   pioglitazone (ACTOS) 15 MG tablet TAKE 1 TABLET BY MOUTH ONCE DAILY   sertraline (ZOLOFT) 100 MG tablet TAKE 1 TABLET BY MOUTH ONCE DAILY   tamsulosin (FLOMAX) 0.4 MG CAPS capsule Take 2 capsules (0.8 mg total) by mouth daily after supper.   testosterone cypionate (DEPOTESTOSTERONE CYPIONATE) 200 MG/ML injection Inject 0.5 mLs (100 mg total) into the muscle every 14 (fourteen) days.   traZODone (DESYREL) 50 MG tablet Take 1 tablet (50 mg total) by mouth at bedtime as needed for sleep.   triamterene-hydrochlorothiazide (MAXZIDE-25) 37.5-25 MG tablet TAKE 1 TABLET BY MOUTH ONCE DAILY     Allergies:   Penicillins   Social History   Socioeconomic History   Marital status: Married    Spouse name: Not on file   Number of children: 2   Years of education: Not on file   Highest education level: Not on file  Occupational History   Occupation: retired Runner, broadcasting/film/video  Tobacco Use   Smoking status: Former    Passive exposure: Never   Smokeless tobacco: Never  Vaping Use   Vaping status: Never Used  Substance and Sexual Activity   Alcohol use: Yes     Alcohol/week: 0.0 standard drinks of alcohol    Comment: occasional   Drug use: No   Sexual activity: Not Currently  Other Topics Concern   Not on file  Social History Narrative   Not on file   Social Determinants of Health   Financial Resource Strain: Low Risk  (11/07/2022)   Received from [facility], [facility]   Overall Financial Resource Strain (CARDIA)    Difficulty of Paying Living Expenses: Not hard at all  Food Insecurity: No Food Insecurity (01/06/2023)   Hunger Vital Sign    Worried About Running Out of Food in  the Last Year: Never true    Ran Out of Food in the Last Year: Never true  Transportation Needs: No Transportation Needs (01/06/2023)   PRAPARE - Administrator, Civil Service (Medical): No    Lack of Transportation (Non-Medical): No  Physical Activity: Insufficiently Active (06/30/2022)   Exercise Vital Sign    Days of Exercise per Week: 2 days    Minutes of Exercise per Session: 60 min  Stress: No Stress Concern Present (06/30/2022)   [employer] of Occupational Health - Occupational Stress Questionnaire    Feeling of Stress : Only a little  Social Connections: Unknown (06/30/2022)   Social Connection and Isolation Panel [NHANES]    Frequency of Communication with Friends and Family: Not on file    Frequency of Social Gatherings with Friends and Family: Not on file    Attends Religious Services: Not on file    Active Member of Clubs or Organizations: Not on file    Attends Banker Meetings: Not on file    Marital Status: Married     Family History: The patient's family history includes Congestive Heart Failure in his father; Heart disease in his father and mother; Kidney disease in his sister; Rheumatic fever in his father. There is no history of Colon cancer or Prostate cancer.  ROS:   Please see the history of present illness.     All other systems reviewed and are  negative.  EKGs/Labs/Other Studies Reviewed:    The following studies were reviewed today:  EKG Interpretation Date/Time:  Monday March 30 2023 09:11:28 EST Ventricular Rate:  82 PR Interval:  248 QRS Duration:  136 QT Interval:  408 QTC Calculation: 476 R Axis:   -1  Text Interpretation: Sinus rhythm with 1st degree A-V block Left bundle branch block Confirmed by Debbe Odea ([id number]) on 03/30/2023 9:23:34 AM    Recent Labs: 10/08/2022: Platelets 275.0; TSH 0.89 01/13/2023: ALT 14; BUN 16; Creatinine, Ser 1.04; Potassium 3.9; Sodium 141 02/27/2023: Hemoglobin 13.0  Recent Lipid Panel    Component Value Date/Time   CHOL 77 01/13/2023 0840   TRIG 195.0 (H) 01/13/2023 0840   HDL 24.60 (L) 01/13/2023 0840   CHOLHDL 3 01/13/2023 0840   VLDL 39.0 01/13/2023 0840   LDLCALC 14 01/13/2023 0840   LDLDIRECT 72.0 10/08/2022 0745     Risk Assessment/Calculations:             Physical Exam:    VS:  BP 134/60 (BP Location: Left Arm, Patient Position: Sitting, Cuff Size: Normal)   Pulse 82   Ht 5\' 9"  (1.753 m)   Wt 179 lb 9.6 oz (81.5 kg)   SpO2 97%   BMI 26.52 kg/m     Wt Readings from Last 3 Encounters:  03/30/23 179 lb 9.6 oz (81.5 kg)  03/04/23 179 lb (81.2 kg)  02/27/23 179 lb 3.2 oz (81.3 kg)     GEN:  Well nourished, well developed in no acute distress HEENT: Normal NECK: No JVD; No carotid bruits CARDIAC: RRR, no murmurs, rubs, gallops RESPIRATORY:  Clear to auscultation without rales, wheezing or rhonchi  ABDOMEN: Soft, non-tender, non-distended MUSCULOSKELETAL:  No edema; No deformity  SKIN: Warm and dry NEUROLOGIC:  Alert and oriented x 3 PSYCHIATRIC:  Normal affect   ASSESSMENT:    1. Atrial fibrillation, unspecified type (HCC)   2. Primary hypertension   3. Mixed hyperlipidemia    PLAN:    In order of problems listed above:  Atrial  fibrillation, diagnosed in 2002.  Unsure if this is lone A-fib or paroxysmal.  Has not had any episodes of  palpitations, no further episodes of A-fib noted on ECGs.  Continue Coreg, aspirin 81 mg daily for now.  Place cardiac monitor to evaluate A-fib if present.  Refer to A-fib clinic for additional input or if longer monitoring in case cardiac monitor is unrevealing recommended.  Obtain echocardiogram. Hypertension, BP controlled.  Continue Coreg 25 mg twice daily, Maxzide, lisinopril. Hyperlipidemia, continue Lipitor 20 mg daily.  Follow-up after cardiac testing.      Medication Adjustments/Labs and Tests Ordered: Current medicines are reviewed at length with the patient today.  Concerns regarding medicines are outlined above.  Orders Placed This Encounter  Procedures   Ambulatory referral to Cardiac Electrophysiology   LONG TERM MONITOR (3-14 DAYS)   EKG 12-Lead   ECHOCARDIOGRAM COMPLETE   No orders of the defined types were placed in this encounter.   Patient Instructions  Medication Instructions:   Your physician recommends that you continue on your current medications as directed. Please refer to the Current Medication list given to you today.  *If you need a refill on your cardiac medications before your next appointment, please call your pharmacy*   Lab Work:  None Ordered  If you have labs (blood work) drawn today and your tests are completely normal, you will receive your results only by: MyChart Message (if you have MyChart) OR A paper copy in the mail If you have any lab test that is abnormal or we need to change your treatment, we will call you to review the results.   Testing/Procedures:  Your physician has requested that you have an echocardiogram - in 2025 . Echocardiography is a painless test that uses sound waves to create images of your heart. It provides your doctor with information about the size and shape of your heart and how well your heart's chambers and valves are working. This procedure takes approximately one hour. There are no restrictions for this  procedure. Please do NOT wear cologne, perfume, aftershave, or lotions (deodorant is allowed). Please arrive 15 minutes prior to your appointment time.  Please note: We ask at that you not bring children with you during ultrasound (echo/ vascular) testing. Due to room size and safety concerns, children are not allowed in the ultrasound rooms during exams. Our front office staff cannot provide observation of children in our lobby area while testing is being conducted. An adult accompanying a patient to their appointment will only be allowed in the ultrasound room at the discretion of the ultrasound technician under special circumstances. We apologize for any inconvenience.  Your physician has recommended that you wear a Zio monitor.   This monitor is a medical device that records the heart's electrical activity. Doctors most often use these monitors to diagnose arrhythmias. Arrhythmias are problems with the speed or rhythm of the heartbeat. The monitor is a small device applied to your chest. You can wear one while you do your normal daily activities. While wearing this monitor if you have any symptoms to push the button and record what you felt. Once you have worn this monitor for the period of time provider prescribed (Usually 14 days), you will return the monitor device in the postage paid box. Once it is returned they will download the data collected and provide [place] with a report which the provider will then review and we will call you with those results. Important tips:  Avoid showering during  the first 24 hours of wearing the monitor. Avoid excessive sweating to help maximize wear time. Do not submerge the device, no hot tubs, and no swimming pools. Keep any lotions or oils away from the patch. After 24 hours you may shower with the patch on. Take brief showers with your back facing the shower head.  Do not remove patch once it has been placed because that will interrupt data and decrease adhesive  wear time. Push the button when you have any symptoms and write down what you were feeling. Once you have completed wearing your monitor, remove and place into box which has postage paid and place in your outgoing mailbox.  If for some reason you have misplaced your box then call our office and we can provide another box and/or mail it off for you.   Follow-Up: At [facility], you and your health needs are our priority.  As part of our continuing mission to provide you with exceptional heart care, we have created designated Provider Care Teams.  These Care Teams include your primary Cardiologist (physician) and Advanced Practice Providers (APPs -  Physician Assistants and Nurse Practitioners) who all work together to provide you with the care you need, when you need it.  We recommend signing up for the patient portal called "MyChart".  Sign up information is provided on this After Visit Summary.  MyChart is used to connect with patients for Virtual Visits (Telemedicine).  Patients are able to view lab/test results, encounter notes, upcoming appointments, etc.  Non-urgent messages can be sent to your provider as well.   To learn more about what you can do with MyChart, go to [email or web address].    Your next appointment:   3 month(s)  Provider:   You may see Debbe Odea, MD or one of the following Advanced Practice Providers on your designated Care Team:   Nicolasa Ducking, NP Eula Listen, PA-C Cadence Fransico Michael, PA-C Charlsie Quest, NP Carlos Levering, NP    Signed, Debbe Odea, MD  03/30/2023 10:31 AM    [facility]

## 2023-04-01 DIAGNOSIS — H04123 Dry eye syndrome of bilateral lacrimal glands: Secondary | ICD-10-CM | POA: Diagnosis not present

## 2023-04-01 DIAGNOSIS — H1045 Other chronic allergic conjunctivitis: Secondary | ICD-10-CM | POA: Diagnosis not present

## 2023-04-01 DIAGNOSIS — G510 Bell's palsy: Secondary | ICD-10-CM | POA: Diagnosis not present

## 2023-04-01 DIAGNOSIS — H353131 Nonexudative age-related macular degeneration, bilateral, early dry stage: Secondary | ICD-10-CM | POA: Diagnosis not present

## 2023-04-01 DIAGNOSIS — E119 Type 2 diabetes mellitus without complications: Secondary | ICD-10-CM | POA: Diagnosis not present

## 2023-04-01 LAB — HM DIABETES EYE EXAM

## 2023-04-03 DIAGNOSIS — I480 Paroxysmal atrial fibrillation: Secondary | ICD-10-CM

## 2023-04-14 ENCOUNTER — Other Ambulatory Visit: Payer: Self-pay | Admitting: Internal Medicine

## 2023-04-23 DIAGNOSIS — I480 Paroxysmal atrial fibrillation: Secondary | ICD-10-CM | POA: Diagnosis not present

## 2023-04-30 DIAGNOSIS — K5900 Constipation, unspecified: Secondary | ICD-10-CM | POA: Diagnosis not present

## 2023-04-30 DIAGNOSIS — Z981 Arthrodesis status: Secondary | ICD-10-CM | POA: Diagnosis not present

## 2023-04-30 DIAGNOSIS — Z860100 Personal history of colon polyps, unspecified: Secondary | ICD-10-CM | POA: Diagnosis not present

## 2023-05-08 DIAGNOSIS — D2271 Melanocytic nevi of right lower limb, including hip: Secondary | ICD-10-CM | POA: Diagnosis not present

## 2023-05-08 DIAGNOSIS — D485 Neoplasm of uncertain behavior of skin: Secondary | ICD-10-CM | POA: Diagnosis not present

## 2023-05-08 DIAGNOSIS — D2262 Melanocytic nevi of left upper limb, including shoulder: Secondary | ICD-10-CM | POA: Diagnosis not present

## 2023-05-08 DIAGNOSIS — C44519 Basal cell carcinoma of skin of other part of trunk: Secondary | ICD-10-CM | POA: Diagnosis not present

## 2023-05-08 DIAGNOSIS — D2272 Melanocytic nevi of left lower limb, including hip: Secondary | ICD-10-CM | POA: Diagnosis not present

## 2023-05-08 DIAGNOSIS — L821 Other seborrheic keratosis: Secondary | ICD-10-CM | POA: Diagnosis not present

## 2023-05-08 DIAGNOSIS — D225 Melanocytic nevi of trunk: Secondary | ICD-10-CM | POA: Diagnosis not present

## 2023-05-08 DIAGNOSIS — D2261 Melanocytic nevi of right upper limb, including shoulder: Secondary | ICD-10-CM | POA: Diagnosis not present

## 2023-05-11 ENCOUNTER — Other Ambulatory Visit: Payer: Self-pay | Admitting: Urology

## 2023-05-18 ENCOUNTER — Ambulatory Visit: Payer: Medicare PPO

## 2023-05-19 ENCOUNTER — Ambulatory Visit: Payer: Medicare PPO | Admitting: Medical

## 2023-05-19 ENCOUNTER — Telehealth: Payer: Self-pay | Admitting: Internal Medicine

## 2023-05-19 DIAGNOSIS — E1159 Type 2 diabetes mellitus with other circulatory complications: Secondary | ICD-10-CM

## 2023-05-19 DIAGNOSIS — E7800 Pure hypercholesterolemia, unspecified: Secondary | ICD-10-CM

## 2023-05-19 NOTE — Telephone Encounter (Signed)
 Patient need lab orders.

## 2023-05-20 ENCOUNTER — Ambulatory Visit: Payer: Medicare PPO | Attending: Cardiology

## 2023-05-20 ENCOUNTER — Other Ambulatory Visit: Payer: Self-pay | Admitting: Internal Medicine

## 2023-05-20 DIAGNOSIS — I4891 Unspecified atrial fibrillation: Secondary | ICD-10-CM

## 2023-05-20 LAB — ECHOCARDIOGRAM COMPLETE
AR max vel: 3.09 cm2
AV Area VTI: 3.01 cm2
AV Area mean vel: 3.04 cm2
AV Mean grad: 3 mm[Hg]
AV Peak grad: 5.8 mm[Hg]
Ao pk vel: 1.2 m/s
Area-P 1/2: 3.99 cm2
S' Lateral: 4.1 cm

## 2023-05-20 MED ORDER — PERFLUTREN LIPID MICROSPHERE
1.0000 mL | INTRAVENOUS | Status: AC | PRN
  Administered 2023-05-20: 2 mL via INTRAVENOUS

## 2023-05-20 NOTE — Addendum Note (Signed)
 Addended by: Rita Ohara D on: 05/20/2023 03:43 PM   Modules accepted: Orders

## 2023-05-20 NOTE — Telephone Encounter (Signed)
  High Drug-Drug: sertraline  and traZODoneAdditive serotonergic effects may occur during coadministration of selective serotonin reuptake inhibitors (SSRIs) and Serotonergic Non-Opioid CNS Depressants, and the risk of developing serotonin syndrome may be increased. Details Override reason     sertraline  (ZOLOFT ) 100 MG tablet [Pharmacy Med Name: SERTRALINE  HCL 100 MG TAB] Prescription. Reordered. Long-term. traZODone  (DESYREL ) 50 MG tabletPrescription. Active. Long-term.

## 2023-05-20 NOTE — Telephone Encounter (Signed)
 Rx sent in for sertraline and actos.

## 2023-05-22 ENCOUNTER — Other Ambulatory Visit: Payer: Self-pay

## 2023-05-22 DIAGNOSIS — I5020 Unspecified systolic (congestive) heart failure: Secondary | ICD-10-CM

## 2023-05-22 MED ORDER — SPIRONOLACTONE 25 MG PO TABS: 25.0000 mg | ORAL_TABLET | Freq: Every day | ORAL | 3 refills | Status: DC

## 2023-05-25 NOTE — Progress Notes (Signed)
 Electrophysiology Office Note:   Date:  05/26/2023  ID:  Ricardo Nash, Ricardo Nash November 30, 1945, MRN [id number]  Primary Cardiologist: Redell Cave, MD Primary Heart Failure: None Electrophysiologist: Fonda Kitty, MD      History of Present Illness:   Ricardo Nash is a 78 y.o. male with h/o 1 episode of atrial fibrillation in 2003, hypertension, diabetes, LBBB, chronic systolic heart failure who is being seen today for evaluation of his atrial fibrillation at the request of Dr. Cave.  First diagnosed with atrial fibrillation in 2002 after recovering from neck surgery.  To his knowledge, has not had any episodes since.  This appears to have been a lone postoperative episode.  He was doing really well up until last July (of 2024) when he underwent back surgery and quickly thereafter had to have gallbladder surgery.  Before this he was walking 2 miles per day at the [facility].  He is hoping to get back to that level of activity soon.  He is able to do all of his usual activities on a daily basis.  He denies any shortness of breath, dyspnea on exertion, lower extremity edema or weight gain.  Today, he reports doing relatively well and has no new or acute complaints.  His only concern at this time is that somebody told him that he needed to be on anticoagulation because he had a history of atrial fibrillation and he would like clarification on this issue.  Review of systems complete and found to be negative unless listed in HPI.   EP Information / Studies Reviewed:    EKG is not ordered today. EKG from 03/30/2023 reviewed which showed sinus rhythm with left bundle branch block, QRS 148 ms by my measurement.     EKG 05/30/2021:   Zio 04/2023: Patient had a min HR of 60 bpm, max HR of 176 bpm, and avg HR of 78 bpm. Predominant underlying rhythm was Sinus Rhythm. First Degree AV Block was present. Bundle Branch Block/IVCD was present.3 Ventricular Tachycardia runs occurred, the run with the   fastest interval lasting 13.3 secs with a max rate of 176 bpm (avg 132 bpm); the run with the fastest interval was also the longest. 4 Supraventricular Tachycardia runs occurred, the run with the fastest interval lasting 16 beats with a max rate of 126  bpm (avg 108 bpm); the run with the fastest interval was also the longest. Isolated SVEs were rare (<1.0%), SVE Couplets were rare (<1.0%), and no SVE Triplets were present. Isolated VEs were rare (<1.0%), VE Couplets were rare (<1.0%), and no VE  Triplets were present. Ventricular Bigeminy and Trigeminy were present.   Conclusion Paroxysmal SVT noted, 3 episodes of nonsustained VT noted. No atrial fibrillation or atrial flutter.  Echo 05/20/2023: Moderately reduced LV systolic function.  LVEF 35 to 40%.  Moderate concentric LVH. Normal RV size and function. Mild MR.  No significant valvular disease. Left and right atrium are normal in size.  Physical Exam:   VS:  BP (!) 134/54   Pulse 76   Ht 5' 9 (1.753 m)   Wt 181 lb 9.6 oz (82.4 kg)   SpO2 98%   BMI 26.82 kg/m    Wt Readings from Last 3 Encounters:  05/26/23 181 lb 9.6 oz (82.4 kg)  05/26/23 181 lb 9.6 oz (82.4 kg)  03/30/23 179 lb 9.6 oz (81.5 kg)     GEN: Well nourished, well developed in no acute distress NECK: No JVD CARDIAC: Normal rate and regular rhythm  RESPIRATORY:  Clear to auscultation without rales, wheezing or rhonchi  ABDOMEN: Soft, non-tender, non-distended EXTREMITIES:  No edema; No deformity   ASSESSMENT AND PLAN:   Ricardo Nash is a 78 y.o. male with h/o 1 episode of atrial fibrillation in 2003, hypertension, diabetes, LBBB, chronic systolic heart failure who is being seen today for evaluation of his atrial fibrillation at the request of Dr. Darliss.  # Left bundle branch block: By my assessment QRS is 148 ms in duration.  EKG with typical appearing left bundle. # Chronic systolic heart failure: New diagnosis of reduced LVEF 35 to 40%. -Patient  warrants ischemic evaluation given drop in LVEF.  He is scheduled to see heart failure where they can make a determination on left heart catheterization versus coronary CTA versus PET.  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 -Patient has been on GDMT with lisinopril  and carvedilol  since at least October 2024.  More recently started on spironolactone .  I think it would be reasonable to repeat echocardiogram in 2-3 months to assess for improvement in his LV systolic function.  He does not currently have any symptoms of heart failure or evidence of clinical heart failure.  However, if LVEF remains low and ischemic evaluation is unremarkable, then pursuing CRT-P implant would be reasonable if he were to develop clinical heart failure symptomatology in the future.  I will communicate this with my heart failure and referring colleagues.  # Lone atrial fibrillation: We only know of 1 episode of atrial fibrillation which occurred in the postoperative setting back in 2023.  He does however have numerous risk factors for atrial fibrillation. # Secondary hypercoagulable state due to atrial fibrillation: -Given his presence of risk factors and only 1 known episode of atrial fibrillation and Zio monitor that was unremarkable.  I think that more extended monitoring for atrial fibrillation is warranted.  Given that there are no immediate plans for CRT, I think loop recorder implant would be reasonable. -He has been off anticoagulation and has had no known recurrence.  I think it is reasonable for him to remain off anticoagulation as long as we pursue monitoring strategy as above.  Follow up with Dr. Kennyth for loop recorder implant.  Signed, Fonda Kennyth, MD

## 2023-05-26 ENCOUNTER — Ambulatory Visit (INDEPENDENT_AMBULATORY_CARE_PROVIDER_SITE_OTHER): Payer: Medicare PPO | Admitting: Medical

## 2023-05-26 ENCOUNTER — Encounter: Payer: Self-pay | Admitting: Medical

## 2023-05-26 ENCOUNTER — Encounter: Payer: Self-pay | Admitting: Cardiology

## 2023-05-26 ENCOUNTER — Ambulatory Visit: Payer: Medicare PPO | Attending: Cardiology | Admitting: Cardiology

## 2023-05-26 VITALS — BP 134/54 | HR 76 | Ht 69.0 in | Wt 181.6 lb

## 2023-05-26 VITALS — BP 142/56 | HR 79 | Ht 69.0 in | Wt 181.6 lb

## 2023-05-26 DIAGNOSIS — E7800 Pure hypercholesterolemia, unspecified: Secondary | ICD-10-CM

## 2023-05-26 DIAGNOSIS — I4891 Unspecified atrial fibrillation: Secondary | ICD-10-CM

## 2023-05-26 DIAGNOSIS — I10 Essential (primary) hypertension: Secondary | ICD-10-CM

## 2023-05-26 DIAGNOSIS — E1159 Type 2 diabetes mellitus with other circulatory complications: Secondary | ICD-10-CM

## 2023-05-26 DIAGNOSIS — Z79899 Other long term (current) drug therapy: Secondary | ICD-10-CM

## 2023-05-26 DIAGNOSIS — I429 Cardiomyopathy, unspecified: Secondary | ICD-10-CM | POA: Diagnosis not present

## 2023-05-26 DIAGNOSIS — I5022 Chronic systolic (congestive) heart failure: Secondary | ICD-10-CM | POA: Diagnosis not present

## 2023-05-26 DIAGNOSIS — I480 Paroxysmal atrial fibrillation: Secondary | ICD-10-CM

## 2023-05-26 DIAGNOSIS — I447 Left bundle-branch block, unspecified: Secondary | ICD-10-CM | POA: Diagnosis not present

## 2023-05-26 MED ORDER — DAPAGLIFLOZIN PROPANEDIOL 10 MG PO TABS: 10.0000 mg | ORAL_TABLET | Freq: Every day | ORAL | 3 refills | Status: DC

## 2023-05-26 NOTE — Progress Notes (Signed)
 Cardiology Office Note:    Date:  05/27/2023   ID:  Ricardo Nash, DOB [birth year], MRN [id number]  PCP:  Glendia Shad, MD  [facility] Cardiologist:  Redell Cave, MD  [facility] Electrophysiologist:  Fonda Kitty, MD   Referring MD: Glendia Shad, MD   Chief Complaint:  testing follow-up  History of Present Illness:    Ricardo Nash is a 78 y.o. male with a hx of HTN, Afib, diabetes who presents for follow-up.   Exercise MPI in 12/2017 showed no evidence of ischemia. Echo in 2023 showed LVEF 40-45% (MD felt 50%), septal bounce consistent with BBB.   He was seen 03/2023 for afib, unsure if it was lone or paroxysmal. Heart monitor was placed and he was referred to the Afib clinic. Echo was ordered.  Heart monitor showed predominantly normal sinus rhythm, first-degree AV block, bundle branch block/IVCD present, 3 runs of nonsustained VT, paroxysmal SVT, no A-fib or flutter.  Reticular bigeminy and trigeminy were present.  Echocardiogram showed EF of 35 to 40%, moderate LVH, grade 1 diastolic dysfunction, mild MR.  Today, heart monitor and echo were reviewed. He denies chest pain, SOB, palpitations, lower leg edema. Patient is agreeable to pursue cardiac cath.   Past Medical History:  Diagnosis Date   C. difficile colitis    Coronary artery disease    Depression    Diabetes mellitus (HCC)    Diverticulitis    Diverticulosis    Dysrhythmia    PAF   Environmental allergies    GERD (gastroesophageal reflux disease)    Goiter    intrathoracic, s/p benign biopsy (Dr Eletha)   Hypercholesterolemia    Hypertension    Osteoarthritis    cervical spine, lumbar spine   Paroxysmal atrial fibrillation (HCC)    Pneumonia    PONV (postoperative nausea and vomiting)    Sleep apnea    CPAP @ NIGHT   Stroke [facility])    TIA (transient ischemic attack)     Past Surgical History:  Procedure Laterality Date   BACK SURGERY  12/2006   s/p fusion of L4-L5   BACK  SURGERY  2024   capsule endoscopy     CATARACT EXTRACTION  2011   Dr. Cleatus   CERVICAL DISC SURGERY  2002   CHOLECYSTECTOMY  2024   COLONOSCOPY WITH PROPOFOL  N/A 07/20/2017   Procedure: COLONOSCOPY WITH PROPOFOL ;  Surgeon: Viktoria Lamar DASEN, MD;  Location: [facility] ENDOSCOPY;  Service: Endoscopy;  Laterality: N/A;   EYE SURGERY     cataract bilateral 10/1999   eyelid reduction     Dr. Gretta FONTANA HERNIA REPAIR  1991   Dr, Claudene ILES LAMINECTOMY/DECOMPRESSION MICRODISCECTOMY N/A 10/15/2020   Procedure: Laminectomy - Lumbar Two-Lumbar Three - Lumbar Three-Lumbar Four with sublaminar decompression;  Surgeon: Joshua Alm RAMAN, MD;  Location: [facility] OR;  Service: Neurosurgery;  Laterality: N/A;  Laminectomy - Lumbar Two-Lumbar Three - Lumbar Three-Lumbar Four with sublaminar decompression   NOSE SURGERY     turbinate reduction   SEPTOPLASTY  1975   SHOULDER SURGERY  2000   rotator cuff    Current Medications: Current Meds  Medication Sig   ACCU-CHEK GUIDE test strip USE AS DIRECTED TO TEST BLOOD GLUCOSE LEVELS TWICE DAILY   ALPRAZolam  (XANAX ) 0.25 MG tablet TAKE 1 TABLET BY MOUTH ONCE DAILY AS NEEDED   amLODipine  (NORVASC ) 10 MG tablet TAKE 1 TABLET BY MOUTH ONCE DAILY   Ascorbic Acid (VITAMIN C) 100 MG tablet Take 100  mg by mouth in the morning.   aspirin  EC 81 MG tablet Take 81 mg by mouth every evening. Swallow whole.   atorvastatin  (LIPITOR) 20 MG tablet TAKE 1 TABLET BY MOUTH ONCE EVERY EVENING   Azelastine -Fluticasone  137-50 MCG/ACT SUSP PLACE 1 SPRAY INTO EACH NOSTRIL ONCE EVERY MORNING AND AT BEDTIME   blood glucose meter kit and supplies Dispense based on patient and insurance preference. Use up to four times daily as directed. (FOR ICD-10 E10.9, E11.9).   carvedilol  (COREG ) 25 MG tablet TAKE 1 TABLET BY MOUTH TWICE DAILY WITH MEALS   dapagliflozin  propanediol (FARXIGA ) 10 MG TABS tablet Take 1 tablet (10 mg total) by mouth daily before breakfast.   docusate sodium (COLACE) 100  MG capsule Take 100 mg by mouth daily.   Dulaglutide  (TRULICITY ) 3 MG/0.5ML SOPN Inject 3 mg as directed once a week.   levocetirizine (XYZAL ) 5 MG tablet TAKE 1 TABLET BY MOUTH ONCE EVERY EVENING   lisinopril  (ZESTRIL ) 40 MG tablet TAKE 1 TABLET BY MOUTH ONCE DAILY   metFORMIN  (GLUCOPHAGE ) 1000 MG tablet Take 1 tablet (1,000 mg total) by mouth 2 (two) times daily with a meal.   montelukast  (SINGULAIR ) 10 MG tablet TAKE 1 TABLET BY MOUTH AT BEDTIME   Multiple Vitamin (MULTIVITAMIN WITH MINERALS) TABS tablet Take 1 tablet by mouth in the morning.   ondansetron  (ZOFRAN ) 4 MG tablet Take 1 tablet (4 mg total) by mouth every 8 (eight) hours as needed for nausea or vomiting.   pantoprazole  (PROTONIX ) 40 MG tablet TAKE 1 TABLET BY MOUTH ONCE DAILY   pioglitazone  (ACTOS ) 15 MG tablet TAKE 1 TABLET BY MOUTH ONCE DAILY   pregabalin (LYRICA) 100 MG capsule Take 100 mg by mouth 2 (two) times daily.   sertraline  (ZOLOFT ) 100 MG tablet TAKE 1 TABLET BY MOUTH ONCE DAILY   spironolactone  (ALDACTONE ) 25 MG tablet Take 1 tablet (25 mg total) by mouth daily.   tamsulosin  (FLOMAX ) 0.4 MG CAPS capsule Take 2 capsules (0.8 mg total) by mouth daily after supper.   testosterone  cypionate (DEPOTESTOSTERONE CYPIONATE) 200 MG/ML injection INJECT 0.5MLS INTRAMUSCULARLY EVERY 14 DAYS   traZODone  (DESYREL ) 50 MG tablet Take 1 tablet (50 mg total) by mouth at bedtime as needed for sleep.     Allergies:   Penicillins and Penicillin g   Social History   Socioeconomic History   Marital status: Married    Spouse name: Not on file   Number of children: 2   Years of education: Not on file   Highest education level: Not on file  Occupational History   Occupation: retired runner, broadcasting/film/video  Tobacco Use   Smoking status: Former    Passive exposure: Never   Smokeless tobacco: Never  Vaping Use   Vaping status: Never Used  Substance and Sexual Activity   Alcohol use: Yes    Alcohol/week: 0.0 standard drinks of alcohol     Comment: occasional   Drug use: No   Sexual activity: Not Currently  Other Topics Concern   Not on file  Social History Narrative   Not on file   Social Drivers of Health   Financial Resource Strain: Low Risk  (04/30/2023)   Received from [facility]   Overall Financial Resource Strain (CARDIA)    Difficulty of Paying Living Expenses: Not hard at all  Food Insecurity: No Food Insecurity (04/30/2023)   Received from [facility]   Hunger Vital Sign    Worried About Running Out of Food in  the Last Year: Never true    Ran Out of Food in the Last Year: Never true  Transportation Needs: No Transportation Needs (04/30/2023)   Received from [facility] - Transportation    In the past 12 months, has lack of transportation kept you from medical appointments or from getting medications?: No    Lack of Transportation (Non-Medical): No  Physical Activity: Insufficiently Active (06/30/2022)   Exercise Vital Sign    Days of Exercise per Week: 2 days    Minutes of Exercise per Session: 60 min  Stress: No Stress Concern Present (06/30/2022)   [employer] of Occupational [employer] - Occupational Stress Questionnaire    Feeling of Stress : Only a little  Social Connections: Unknown (06/30/2022)   Social Connection and Isolation Panel [NHANES]    Frequency of Communication with Friends and Family: Not on file    Frequency of Social Gatherings with Friends and Family: Not on file    Attends Religious Services: Not on file    Active Member of Clubs or Organizations: Not on file    Attends Banker Meetings: Not on file    Marital Status: Married     Family History: The patient's family history includes Congestive Heart Failure in his father; Heart disease in his father and mother; Kidney disease in his sister; Rheumatic fever in his father. There is no history of Colon cancer or Prostate cancer.  ROS:   Please see the  history of present illness.     All other systems reviewed and are negative.  EKGs/Labs/Other Studies Reviewed:    The following studies were reviewed today:  Echo 05/2023 1. Left ventricular ejection fraction, by estimation, is 35 to 40%. Left  ventricular ejection fraction by PLAX is 40 %. The left ventricle has  moderately decreased function. The left ventricle demonstrates global  hypokinesis. There is moderate  concentric left ventricular hypertrophy. Left ventricular diastolic  parameters are consistent with Grade I diastolic dysfunction (impaired  relaxation).   2. Right ventricular systolic function is normal. The right ventricular  size is normal.   3. The mitral valve is normal in structure. Mild mitral valve  regurgitation. No evidence of mitral stenosis.   4. The aortic valve is tricuspid. Aortic valve regurgitation is not  visualized. Aortic valve sclerosis is present, with no evidence of aortic  valve stenosis. Aortic valve mean gradient measures 3.0 mmHg.   5. The inferior vena cava is normal in size with greater than 50%  respiratory variability, suggesting right atrial pressure of 3 mmHg.   Heart monitor 04/2023  Patch Wear Time:  13 days and 23 hours (2024-11-22T10:52:08-498 to 2024-12-06T10:52:00-0500)   Patient had a min HR of 60 bpm, max HR of 176 bpm, and avg HR of 78 bpm. Predominant underlying rhythm was Sinus Rhythm. First Degree AV Block was present. Bundle Branch Block/IVCD was present.3 Ventricular Tachycardia runs occurred, the run with the  fastest interval lasting 13.3 secs with a max rate of 176 bpm (avg 132 bpm); the run with the fastest interval was also the longest. 4 Supraventricular Tachycardia runs occurred, the run with the fastest interval lasting 16 beats with a max rate of 126  bpm (avg 108 bpm); the run with the fastest interval was also the longest. Isolated SVEs were rare (<1.0%), SVE Couplets were rare (<1.0%), and no SVE Triplets were  present. Isolated VEs were rare (<1.0%), VE Couplets were rare (<1.0%), and no VE  Triplets  were present. Ventricular Bigeminy and Trigeminy were present.   Conclusion Paroxysmal SVT noted, 3 episodes of nonsustained VT noted. No atrial fibrillation or atrial flutter.   Echo 06/2021 1. Left ventricular ejection fraction, by estimation, is 40 to 45%. The  left ventricle has mildly decreased function. The left ventricle  demonstrates global hypokinesis. The left ventricular internal cavity size  was mildly to moderately dilated. Left  ventricular diastolic parameters were normal.   2. Right ventricular systolic function is low normal. The right  ventricular size is mildly enlarged. Mildly increased right ventricular  wall thickness.   3. The mitral valve is grossly normal. Trivial mitral valve  regurgitation.   4. The aortic valve is grossly normal. Aortic valve regurgitation is not  visualized.    EKG:  EKG is ordered today.  The ekg ordered today demonstrates NSR 76bpm LBBB, diffuse TWI  Recent Labs: 10/08/2022: TSH 0.89 05/26/2023: ALT 19; BUN 17; Creatinine, Ser 1.00; Hemoglobin 13.1; Platelets 242; Potassium 4.7; Sodium 145  Recent Lipid Panel    Component Value Date/Time   CHOL 117 05/26/2023 1620   TRIG 185 (H) 05/26/2023 1620   HDL 37 (L) 05/26/2023 1620   CHOLHDL 3.2 05/26/2023 1620   CHOLHDL 3 01/13/2023 0840   VLDL 39.0 01/13/2023 0840   LDLCALC 49 05/26/2023 1620   LDLDIRECT 72.0 10/08/2022 0745    Physical Exam:    VS:  BP (!) 142/56   Pulse 79   Ht 5' 9 (1.753 m)   Wt 181 lb 9.6 oz (82.4 kg)   SpO2 96%   BMI 26.82 kg/m     Wt Readings from Last 3 Encounters:  05/26/23 181 lb 9.6 oz (82.4 kg)  05/26/23 181 lb 9.6 oz (82.4 kg)  03/30/23 179 lb 9.6 oz (81.5 kg)     GEN:  Well nourished, well developed in no acute distress HEENT: Normal NECK: No JVD; No carotid bruits LYMPHATICS: No lymphadenopathy CARDIAC: RRR, no murmurs, rubs, gallops RESPIRATORY:   Clear to auscultation without rales, wheezing or rhonchi  ABDOMEN: Soft, non-tender, non-distended MUSCULOSKELETAL:  No edema; No deformity  SKIN: Warm and dry NEUROLOGIC:  Alert and oriented x 3 PSYCHIATRIC:  Normal affect   ASSESSMENT:    1. Cardiomyopathy, unspecified type (HCC)   2. Paroxysmal atrial fibrillation (HCC)   3. Essential hypertension   4. Type 2 diabetes mellitus with other circulatory complication, without long-term current use of insulin  (HCC)   5. Hypercholesterolemia   6. Medication management    PLAN:    In order of problems listed above:  Cardiomyopathy Echo showed newly reduced LVEF 35-40%, unclear etiology of cardiomyopathy. May be from LBBB. The patient denies chest pain, SOB, LLE, orthopnea or pnd. He is euvolemic on exam. I will order a Left heart cath.  Afib Long history of afib, however unclear if lone afib vs paroxysmal Afib. Heart monitor showed no Afib. Patient is inquiring about a LINQ device. He is on a/c with ASA. He is willing to take a DOAC if necessary. Patient saw EP today.  May consider CRT-P implant if EF remains low in the future.  HTN BP is a little high. Will continue GDMT at follow-up.  HLD Total chol 77, TG 195, HDL 24, LDL 14. Lipids have been ordered. Continue Lipitor 20mg  daily. Can consider Vascepa at follow-up.  Disposition: Follow up in 3 week(s) with MD/APP   Shared Decision Making/Informed Consent   Informed Consent   Shared Decision Making/Informed Consent The risks [stroke (1  in 1000), death (1 in 1000), kidney failure [usually temporary] (1 in 500), bleeding (1 in 200), allergic reaction [possibly serious] (1 in 200)], benefits (diagnostic support and management of coronary artery disease) and alternatives of a cardiac catheterization were discussed in detail with Mr. Crisci and he is willing to proceed.       Signed, Darcia Lampi VEAR Fishman, PA-C  05/27/2023 9:05 AM    [facility]

## 2023-05-26 NOTE — Addendum Note (Signed)
 Addended by: Parke Poisson on: 05/26/2023 03:52 PM   Modules accepted: Orders

## 2023-05-26 NOTE — Patient Instructions (Signed)
 Medication Instructions:  Your physician recommends that you continue on your current medications as directed. Please refer to the Current Medication list given to you today.  *If you need a refill on your cardiac medications before your next appointment, please call your pharmacy*  Follow-Up: At [facility], you and your health needs are our priority.  As part of our continuing mission to provide you with exceptional heart care, we have created designated Provider Care Teams.  These Care Teams include your primary Cardiologist (physician) and Advanced Practice Providers (APPs -  Physician Assistants and Nurse Practitioners) who all work together to provide you with the care you need, when you need it.  Your next appointment:   We will call you to schedule your follow up appointment after Dr. Kennyth talks with the Heart Failure Clinic

## 2023-05-26 NOTE — Addendum Note (Signed)
 Addended by: Parke Poisson on: 05/26/2023 03:51 PM   Modules accepted: Orders

## 2023-05-26 NOTE — Patient Instructions (Addendum)
 Medication Instructions:  Your physician recommends the following medication changes.  START TAKING: Farxiga  10 mg by mouth daily  *If you need a refill on your cardiac medications before your next appointment, please call your pharmacy*   Lab Work: Your provider would like for you to have following labs drawn today (CBC, BMP).     Testing/Procedures: Your physician has requested that you have a cardiac catheterization. Cardiac catheterization is used to diagnose and/or treat various heart conditions. Doctors may recommend this procedure for a number of different reasons. The most common reason is to evaluate chest pain. Chest pain can be a symptom of coronary artery disease (CAD), and cardiac catheterization can show whether plaque is narrowing or blocking your heart's arteries. This procedure is also used to evaluate the valves, as well as measure the blood flow and oxygen levels in different parts of your heart. For further information please visit [email or web address] Please follow instruction sheet, as given. Please see instruction below    Follow-Up: At [facility], you and your health needs are our priority.  As part of our continuing mission to provide you with exceptional heart care, we have created designated Provider Care Teams.  These Care Teams include your primary Cardiologist (physician) and Advanced Practice Providers (APPs -  Physician Assistants and Nurse Practitioners) who all work together to provide you with the care you need, when you need it.  We recommend signing up for the patient portal called MyChart.  Sign up information is provided on this After Visit Summary.  MyChart is used to connect with patients for Virtual Visits (Telemedicine).  Patients are able to view lab/test results, encounter notes, upcoming appointments, etc.  Non-urgent messages can be sent to your provider as well.   To learn more about what you can do with MyChart, go to  [email or web address].    Your next appointment:   3 week(s)  Provider:   Cadence Franchester, PA-C     [facility] A DEPT OF [facility] AT [facility] [address] [place], [address] [place] [address] Dept: [phone] Loc: [phone]  Johnmatthew Solorio Mcisaac  05/26/2023  You are scheduled for a Cardiac Catheterization on Tuesday, January 21 with Dr. Lonni End.  1. Please arrive at the [facility] Entrance of [facility], [address], [place] [address] at 9:30 AM (This is 1 hour(s) prior to your procedure time).  Proceed to the Check-In Desk directly inside the entrance.  Procedure Parking: Use the entrance off of the [address] side of the hospital. Turn right upon entering and follow the driveway to parking that is directly in front of the [facility] Center. There is no valet parking available at this entrance, however there is an awning directly in front of the [facility] & Vascular Center for drop off/ pick up for patients.  Special note: Every effort is made to have your procedure done on time. Please understand that emergencies sometimes delay scheduled procedures.  2. Diet: Do not eat solid foods after midnight.  The patient may have clear liquids until 5am upon the day of the procedure.  3. Labs: You will need to have blood drawn today (CBC, BMP)  4. Medication instructions in preparation for your procedure:  Hold Metformin  the morning of procedure and 48 hours after    Contrast Allergy: No  On the morning of your procedure, take your Aspirin  81 mg and any morning medicines NOT listed above.  You may use sips of water .  5. Plan to go home the same day, you will only stay overnight if medically necessary. 6. Bring a current list of your medications and current insurance cards. 7. You MUST have a responsible person to drive you home. 8. Someone MUST be with you the first 24 hours after you  arrive home or your discharge will be delayed. 9. Please wear clothes that are easy to get on and off and wear slip-on shoes.  Thank you for allowing us  to care for you!   -- [facility]

## 2023-05-26 NOTE — H&P (View-Only) (Signed)
 Cardiology Office Note:    Date:  05/27/2023   ID:  Ricardo Nash, DOB 1945-07-23, MRN [id number]  PCP:  Dale Brimhall Nizhoni, MD  [facility] Cardiologist:  Debbe Odea, MD  [facility] Electrophysiologist:  Nobie Putnam, MD   Referring MD: Dale Pearisburg, MD   Chief Complaint:  testing follow-up  History of Present Illness:    Ricardo Nash is a 78 y.o. male with a hx of HTN, Afib, diabetes who presents for follow-up.   Exercise MPI in 12/2017 showed no evidence of ischemia. Echo in 2023 showed LVEF 40-45% (MD felt 50%), septal bounce consistent with BBB.   He was seen 03/2023 for afib, unsure if it was lone or paroxysmal. Heart monitor was placed and he was referred to the Afib clinic. Echo was ordered.  Heart monitor showed predominantly normal sinus rhythm, first-degree AV block, bundle branch block/IVCD present, 3 runs of nonsustained VT, paroxysmal SVT, no A-fib or flutter.  Reticular bigeminy and trigeminy were present.  Echocardiogram showed EF of 35 to 40%, moderate LVH, grade 1 diastolic dysfunction, mild MR.  Today, heart monitor and echo were reviewed. He denies chest pain, SOB, palpitations, lower leg edema. Patient is agreeable to pursue cardiac cath.   Past Medical History:  Diagnosis Date   C. difficile colitis    Coronary artery disease    Depression    Diabetes mellitus (HCC)    Diverticulitis    Diverticulosis    Dysrhythmia    PAF   Environmental allergies    GERD (gastroesophageal reflux disease)    Goiter    intrathoracic, s/p benign biopsy (Dr Gerrit Friends)   Hypercholesterolemia    Hypertension    Osteoarthritis    cervical spine, lumbar spine   Paroxysmal atrial fibrillation (HCC)    Pneumonia    PONV (postoperative nausea and vomiting)    Sleep apnea    CPAP @ NIGHT   Stroke [facility])    TIA (transient ischemic attack)     Past Surgical History:  Procedure Laterality Date   BACK SURGERY  12/2006   s/p fusion of L4-L5   BACK  SURGERY  2024   capsule endoscopy     CATARACT EXTRACTION  2011   Dr. Elmer Picker   CERVICAL DISC SURGERY  2002   CHOLECYSTECTOMY  2024   COLONOSCOPY WITH PROPOFOL N/A 07/20/2017   Procedure: COLONOSCOPY WITH PROPOFOL;  Surgeon: Scot Jun, MD;  Location: [facility] ENDOSCOPY;  Service: Endoscopy;  Laterality: N/A;   EYE SURGERY     cataract bilateral 10/1999   eyelid reduction     Dr. Beryle Flock HERNIA REPAIR  1991   Dr, Tobie Poet LAMINECTOMY/DECOMPRESSION MICRODISCECTOMY N/A 10/15/2020   Procedure: Laminectomy - Lumbar Two-Lumbar Three - Lumbar Three-Lumbar Four with sublaminar decompression;  Surgeon: Tia Alert, MD;  Location: [facility] OR;  Service: Neurosurgery;  Laterality: N/A;  Laminectomy - Lumbar Two-Lumbar Three - Lumbar Three-Lumbar Four with sublaminar decompression   NOSE SURGERY     turbinate reduction   SEPTOPLASTY  1975   SHOULDER SURGERY  2000   rotator cuff    Current Medications: Current Meds  Medication Sig   ACCU-CHEK GUIDE test strip USE AS DIRECTED TO TEST BLOOD GLUCOSE LEVELS TWICE DAILY   ALPRAZolam (XANAX) 0.25 MG tablet TAKE 1 TABLET BY MOUTH ONCE DAILY AS NEEDED   amLODipine (NORVASC) 10 MG tablet TAKE 1 TABLET BY MOUTH ONCE DAILY   Ascorbic Acid (VITAMIN C) 100 MG tablet Take 100  mg by mouth in the morning.   aspirin  EC 81 MG tablet Take 81 mg by mouth every evening. Swallow whole.   atorvastatin  (LIPITOR) 20 MG tablet TAKE 1 TABLET BY MOUTH ONCE EVERY EVENING   Azelastine -Fluticasone  137-50 MCG/ACT SUSP PLACE 1 SPRAY INTO EACH NOSTRIL ONCE EVERY MORNING AND AT BEDTIME   blood glucose meter kit and supplies Dispense based on patient and insurance preference. Use up to four times daily as directed. (FOR ICD-10 E10.9, E11.9).   carvedilol  (COREG ) 25 MG tablet TAKE 1 TABLET BY MOUTH TWICE DAILY WITH MEALS   dapagliflozin  propanediol (FARXIGA ) 10 MG TABS tablet Take 1 tablet (10 mg total) by mouth daily before breakfast.   docusate sodium (COLACE) 100  MG capsule Take 100 mg by mouth daily.   Dulaglutide  (TRULICITY ) 3 MG/0.5ML SOPN Inject 3 mg as directed once a week.   levocetirizine (XYZAL ) 5 MG tablet TAKE 1 TABLET BY MOUTH ONCE EVERY EVENING   lisinopril  (ZESTRIL ) 40 MG tablet TAKE 1 TABLET BY MOUTH ONCE DAILY   metFORMIN  (GLUCOPHAGE ) 1000 MG tablet Take 1 tablet (1,000 mg total) by mouth 2 (two) times daily with a meal.   montelukast  (SINGULAIR ) 10 MG tablet TAKE 1 TABLET BY MOUTH AT BEDTIME   Multiple Vitamin (MULTIVITAMIN WITH MINERALS) TABS tablet Take 1 tablet by mouth in the morning.   ondansetron  (ZOFRAN ) 4 MG tablet Take 1 tablet (4 mg total) by mouth every 8 (eight) hours as needed for nausea or vomiting.   pantoprazole  (PROTONIX ) 40 MG tablet TAKE 1 TABLET BY MOUTH ONCE DAILY   pioglitazone  (ACTOS ) 15 MG tablet TAKE 1 TABLET BY MOUTH ONCE DAILY   pregabalin (LYRICA) 100 MG capsule Take 100 mg by mouth 2 (two) times daily.   sertraline  (ZOLOFT ) 100 MG tablet TAKE 1 TABLET BY MOUTH ONCE DAILY   spironolactone  (ALDACTONE ) 25 MG tablet Take 1 tablet (25 mg total) by mouth daily.   tamsulosin  (FLOMAX ) 0.4 MG CAPS capsule Take 2 capsules (0.8 mg total) by mouth daily after supper.   testosterone  cypionate (DEPOTESTOSTERONE CYPIONATE) 200 MG/ML injection INJECT 0.5MLS INTRAMUSCULARLY EVERY 14 DAYS   traZODone  (DESYREL ) 50 MG tablet Take 1 tablet (50 mg total) by mouth at bedtime as needed for sleep.     Allergies:   Penicillins and Penicillin g   Social History   Socioeconomic History   Marital status: Married    Spouse name: Not on file   Number of children: 2   Years of education: Not on file   Highest education level: Not on file  Occupational History   Occupation: retired runner, broadcasting/film/video  Tobacco Use   Smoking status: Former    Passive exposure: Never   Smokeless tobacco: Never  Vaping Use   Vaping status: Never Used  Substance and Sexual Activity   Alcohol use: Yes    Alcohol/week: 0.0 standard drinks of alcohol     Comment: occasional   Drug use: No   Sexual activity: Not Currently  Other Topics Concern   Not on file  Social History Narrative   Not on file   Social Drivers of Health   Financial Resource Strain: Low Risk  (04/30/2023)   Received from [facility]   Overall Financial Resource Strain (CARDIA)    Difficulty of Paying Living Expenses: Not hard at all  Food Insecurity: No Food Insecurity (04/30/2023)   Received from [facility]   Hunger Vital Sign    Worried About Running Out of Food in  the Last Year: Never true    Ran Out of Food in the Last Year: Never true  Transportation Needs: No Transportation Needs (04/30/2023)   Received from [facility] - Transportation    In the past 12 months, has lack of transportation kept you from medical appointments or from getting medications?: No    Lack of Transportation (Non-Medical): No  Physical Activity: Insufficiently Active (06/30/2022)   Exercise Vital Sign    Days of Exercise per Week: 2 days    Minutes of Exercise per Session: 60 min  Stress: No Stress Concern Present (06/30/2022)   [employer] - Occupational Stress Questionnaire    Feeling of Stress : Only a little  Social Connections: Unknown (06/30/2022)   Social Connection and Isolation Panel [NHANES]    Frequency of Communication with Friends and Family: Not on file    Frequency of Social Gatherings with Friends and Family: Not on file    Attends Religious Services: Not on file    Active Member of Clubs or Organizations: Not on file    Attends Banker Meetings: Not on file    Marital Status: Married     Family History: The patient's family history includes Congestive Heart Failure in his father; Heart disease in his father and mother; Kidney disease in his sister; Rheumatic fever in his father. There is no history of Colon cancer or Prostate cancer.  ROS:   Please see the  history of present illness.     All other systems reviewed and are negative.  EKGs/Labs/Other Studies Reviewed:    The following studies were reviewed today:  Echo 05/2023 1. Left ventricular ejection fraction, by estimation, is 35 to 40%. Left  ventricular ejection fraction by PLAX is 40 %. The left ventricle has  moderately decreased function. The left ventricle demonstrates global  hypokinesis. There is moderate  concentric left ventricular hypertrophy. Left ventricular diastolic  parameters are consistent with Grade I diastolic dysfunction (impaired  relaxation).   2. Right ventricular systolic function is normal. The right ventricular  size is normal.   3. The mitral valve is normal in structure. Mild mitral valve  regurgitation. No evidence of mitral stenosis.   4. The aortic valve is tricuspid. Aortic valve regurgitation is not  visualized. Aortic valve sclerosis is present, with no evidence of aortic  valve stenosis. Aortic valve mean gradient measures 3.0 mmHg.   5. The inferior vena cava is normal in size with greater than 50%  respiratory variability, suggesting right atrial pressure of 3 mmHg.   Heart monitor 04/2023  Patch Wear Time:  13 days and 23 hours (2024-11-22T10:52:08-498 to 2024-12-06T10:52:00-0500)   Patient had a min HR of 60 bpm, max HR of 176 bpm, and avg HR of 78 bpm. Predominant underlying rhythm was Sinus Rhythm. First Degree AV Block was present. Bundle Branch Block/IVCD was present.3 Ventricular Tachycardia runs occurred, the run with the  fastest interval lasting 13.3 secs with a max rate of 176 bpm (avg 132 bpm); the run with the fastest interval was also the longest. 4 Supraventricular Tachycardia runs occurred, the run with the fastest interval lasting 16 beats with a max rate of 126  bpm (avg 108 bpm); the run with the fastest interval was also the longest. Isolated SVEs were rare (<1.0%), SVE Couplets were rare (<1.0%), and no SVE Triplets were  present. Isolated VEs were rare (<1.0%), VE Couplets were rare (<1.0%), and no VE  Triplets  were present. Ventricular Bigeminy and Trigeminy were present.   Conclusion Paroxysmal SVT noted, 3 episodes of nonsustained VT noted. No atrial fibrillation or atrial flutter.   Echo 06/2021 1. Left ventricular ejection fraction, by estimation, is 40 to 45%. The  left ventricle has mildly decreased function. The left ventricle  demonstrates global hypokinesis. The left ventricular internal cavity size  was mildly to moderately dilated. Left  ventricular diastolic parameters were normal.   2. Right ventricular systolic function is low normal. The right  ventricular size is mildly enlarged. Mildly increased right ventricular  wall thickness.   3. The mitral valve is grossly normal. Trivial mitral valve  regurgitation.   4. The aortic valve is grossly normal. Aortic valve regurgitation is not  visualized.    EKG:  EKG is ordered today.  The ekg ordered today demonstrates NSR 76bpm LBBB, diffuse TWI  Recent Labs: 10/08/2022: TSH 0.89 05/26/2023: ALT 19; BUN 17; Creatinine, Ser 1.00; Hemoglobin 13.1; Platelets 242; Potassium 4.7; Sodium 145  Recent Lipid Panel    Component Value Date/Time   CHOL 117 05/26/2023 1620   TRIG 185 (H) 05/26/2023 1620   HDL 37 (L) 05/26/2023 1620   CHOLHDL 3.2 05/26/2023 1620   CHOLHDL 3 01/13/2023 0840   VLDL 39.0 01/13/2023 0840   LDLCALC 49 05/26/2023 1620   LDLDIRECT 72.0 10/08/2022 0745    Physical Exam:    VS:  BP (!) 142/56   Pulse 79   Ht 5\' 9"  (1.753 m)   Wt 181 lb 9.6 oz (82.4 kg)   SpO2 96%   BMI 26.82 kg/m     Wt Readings from Last 3 Encounters:  05/26/23 181 lb 9.6 oz (82.4 kg)  05/26/23 181 lb 9.6 oz (82.4 kg)  03/30/23 179 lb 9.6 oz (81.5 kg)     GEN:  Well nourished, well developed in no acute distress HEENT: Normal NECK: No JVD; No carotid bruits LYMPHATICS: No lymphadenopathy CARDIAC: RRR, no murmurs, rubs, gallops RESPIRATORY:   Clear to auscultation without rales, wheezing or rhonchi  ABDOMEN: Soft, non-tender, non-distended MUSCULOSKELETAL:  No edema; No deformity  SKIN: Warm and dry NEUROLOGIC:  Alert and oriented x 3 PSYCHIATRIC:  Normal affect   ASSESSMENT:    1. Cardiomyopathy, unspecified type (HCC)   2. Paroxysmal atrial fibrillation (HCC)   3. Essential hypertension   4. Type 2 diabetes mellitus with other circulatory complication, without long-term current use of insulin (HCC)   5. Hypercholesterolemia   6. Medication management    PLAN:    In order of problems listed above:  Cardiomyopathy Echo showed newly reduced LVEF 35-40%, unclear etiology of cardiomyopathy. May be from LBBB. The patient denies chest pain, SOB, LLE, orthopnea or pnd. He is euvolemic on exam. I will order a Left heart cath.  Afib Long history of afib, however unclear if lone afib vs paroxysmal Afib. Heart monitor showed no Afib. Patient is inquiring about a LINQ device. He is on a/c with ASA. He is willing to take a DOAC if necessary. Patient saw EP today.  May consider CRT-P implant if EF remains low in the future.  HTN BP is a little high. Will continue GDMT at follow-up.  HLD Total chol 77, TG 195, HDL 24, LDL 14. Lipids have been ordered. Continue Lipitor 20mg  daily. Can consider Vascepa at follow-up.  Disposition: Follow up in 3 week(s) with MD/APP   Shared Decision Making/Informed Consent   Informed Consent   Shared Decision Making/Informed Consent The risks [stroke (1  in 1000), death (1 in 1000), kidney failure [usually temporary] (1 in 500), bleeding (1 in 200), allergic reaction [possibly serious] (1 in 200)], benefits (diagnostic support and management of coronary artery disease) and alternatives of a cardiac catheterization were discussed in detail with Ricardo Nash and he is willing to proceed.       Signed, Darcia Lampi VEAR Fishman, PA-C  05/27/2023 9:05 AM    [facility]

## 2023-05-27 ENCOUNTER — Encounter: Payer: Self-pay | Admitting: Internal Medicine

## 2023-05-27 DIAGNOSIS — I429 Cardiomyopathy, unspecified: Secondary | ICD-10-CM | POA: Insufficient documentation

## 2023-05-27 LAB — HEPATIC FUNCTION PANEL
ALT: 19 [IU]/L (ref 0–44)
AST: 20 [IU]/L (ref 0–40)
Albumin: 4.1 g/dL (ref 3.8–4.8)
Alkaline Phosphatase: 68 [IU]/L (ref 44–121)
Bilirubin Total: 0.4 mg/dL (ref 0.0–1.2)
Bilirubin, Direct: 0.19 mg/dL (ref 0.00–0.40)
Total Protein: 6.1 g/dL (ref 6.0–8.5)

## 2023-05-27 LAB — BASIC METABOLIC PANEL
BUN/Creatinine Ratio: 17 (ref 10–24)
BUN: 17 mg/dL (ref 8–27)
CO2: 23 mmol/L (ref 20–29)
Calcium: 9.4 mg/dL (ref 8.6–10.2)
Chloride: 106 mmol/L (ref 96–106)
Creatinine, Ser: 1 mg/dL (ref 0.76–1.27)
Glucose: 147 mg/dL — ABNORMAL HIGH (ref 70–99)
Potassium: 4.7 mmol/L (ref 3.5–5.2)
Sodium: 145 mmol/L — ABNORMAL HIGH (ref 134–144)
eGFR: 77 mL/min/{1.73_m2} (ref >59)

## 2023-05-27 LAB — LIPID PANEL
Chol/HDL Ratio: 3.2 {ratio} (ref 0.0–5.0)
Cholesterol, Total: 117 mg/dL (ref 100–199)
HDL: 37 mg/dL — ABNORMAL LOW (ref >39)
LDL Chol Calc (NIH): 49 mg/dL (ref 0–99)
Triglycerides: 185 mg/dL — ABNORMAL HIGH (ref 0–149)
VLDL Cholesterol Cal: 31 mg/dL (ref 5–40)

## 2023-05-27 LAB — CBC
Hematocrit: 40.4 % (ref 37.5–51.0)
Hemoglobin: 13.1 g/dL (ref 13.0–17.7)
MCH: 29 pg (ref 26.6–33.0)
MCHC: 32.4 g/dL (ref 31.5–35.7)
MCV: 90 fL (ref 79–97)
Platelets: 242 10*3/uL (ref 150–450)
RBC: 4.51 x10E6/uL (ref 4.14–5.80)
RDW: 13.4 % (ref 11.6–15.4)
WBC: 8.8 10*3/uL (ref 3.4–10.8)

## 2023-05-27 LAB — HEMOGLOBIN A1C
Est. average glucose Bld gHb Est-mCnc: 140 mg/dL
Hgb A1c MFr Bld: 6.5 % — ABNORMAL HIGH (ref 4.8–5.6)

## 2023-06-02 ENCOUNTER — Ambulatory Visit
Admission: RE | Admit: 2023-06-02 | Discharge: 2023-06-02 | Disposition: A | Discharge status: Home / Self Care | Payer: Medicare PPO | Attending: Internal Medicine | Admitting: Internal Medicine

## 2023-06-02 ENCOUNTER — Encounter
Admission: RE | Disposition: A | Discharge status: Home / Self Care | Payer: Self-pay | Source: Home / Self Care | Attending: Internal Medicine

## 2023-06-02 ENCOUNTER — Other Ambulatory Visit: Payer: Medicare PPO

## 2023-06-02 ENCOUNTER — Other Ambulatory Visit: Payer: Self-pay

## 2023-06-02 ENCOUNTER — Encounter: Payer: Self-pay | Admitting: Internal Medicine

## 2023-06-02 DIAGNOSIS — I2510 Atherosclerotic heart disease of native coronary artery without angina pectoris: Secondary | ICD-10-CM | POA: Insufficient documentation

## 2023-06-02 DIAGNOSIS — I110 Hypertensive heart disease with heart failure: Secondary | ICD-10-CM | POA: Insufficient documentation

## 2023-06-02 DIAGNOSIS — Z7985 Long-term (current) use of injectable non-insulin antidiabetic drugs: Secondary | ICD-10-CM | POA: Diagnosis not present

## 2023-06-02 DIAGNOSIS — I480 Paroxysmal atrial fibrillation: Secondary | ICD-10-CM | POA: Diagnosis not present

## 2023-06-02 DIAGNOSIS — Z8249 Family history of ischemic heart disease and other diseases of the circulatory system: Secondary | ICD-10-CM | POA: Diagnosis not present

## 2023-06-02 DIAGNOSIS — E1159 Type 2 diabetes mellitus with other circulatory complications: Secondary | ICD-10-CM | POA: Insufficient documentation

## 2023-06-02 DIAGNOSIS — I509 Heart failure, unspecified: Secondary | ICD-10-CM | POA: Insufficient documentation

## 2023-06-02 DIAGNOSIS — Z87891 Personal history of nicotine dependence: Secondary | ICD-10-CM | POA: Insufficient documentation

## 2023-06-02 DIAGNOSIS — I429 Cardiomyopathy, unspecified: Secondary | ICD-10-CM | POA: Insufficient documentation

## 2023-06-02 DIAGNOSIS — E7800 Pure hypercholesterolemia, unspecified: Secondary | ICD-10-CM | POA: Insufficient documentation

## 2023-06-02 DIAGNOSIS — I440 Atrioventricular block, first degree: Secondary | ICD-10-CM | POA: Diagnosis not present

## 2023-06-02 DIAGNOSIS — Z7984 Long term (current) use of oral hypoglycemic drugs: Secondary | ICD-10-CM | POA: Diagnosis not present

## 2023-06-02 HISTORY — PX: LEFT HEART CATH AND CORONARY ANGIOGRAPHY: CATH118249

## 2023-06-02 LAB — GLUCOSE, CAPILLARY
Glucose-Capillary: 125 mg/dL — ABNORMAL HIGH (ref 70–99)
Glucose-Capillary: 116 mg/dL — ABNORMAL HIGH (ref 70–99)

## 2023-06-02 SURGERY — LEFT HEART CATH AND CORONARY ANGIOGRAPHY
Anesthesia: Moderate Sedation | Laterality: Left

## 2023-06-02 MED ORDER — MIDAZOLAM HCL 2 MG/2ML IJ SOLN
INTRAMUSCULAR | Status: AC
Start: 2023-06-02 — End: ?
  Filled 2023-06-02: qty 2

## 2023-06-02 MED ORDER — HEPARIN (PORCINE) IN NACL 2000-0.9 UNIT/L-% IV SOLN
INTRAVENOUS | Status: DC | PRN
  Administered 2023-06-02: 1000 mL

## 2023-06-02 MED ORDER — HYDRALAZINE HCL 20 MG/ML IJ SOLN: 10.0000 mg | INTRAMUSCULAR | Status: DC | PRN

## 2023-06-02 MED ORDER — FENTANYL CITRATE (PF) 100 MCG/2ML IJ SOLN
INTRAMUSCULAR | Status: DC | PRN
  Administered 2023-06-02: 50 ug via INTRAVENOUS

## 2023-06-02 MED ORDER — VERAPAMIL HCL 2.5 MG/ML IV SOLN
INTRAVENOUS | Status: DC | PRN
  Administered 2023-06-02 (×2): 2.5 mg via INTRAVENOUS

## 2023-06-02 MED ORDER — SODIUM CHLORIDE 0.9 % IV SOLN: INTRAVENOUS | Status: DC

## 2023-06-02 MED ORDER — ASPIRIN 81 MG PO CHEW: 81.0000 mg | CHEWABLE_TABLET | ORAL | Status: DC

## 2023-06-02 MED ORDER — SODIUM CHLORIDE 0.9% FLUSH: 3.0000 mL | Freq: Two times a day (BID) | INTRAVENOUS | Status: DC

## 2023-06-02 MED ORDER — FENTANYL CITRATE (PF) 100 MCG/2ML IJ SOLN
INTRAMUSCULAR | Status: AC
  Filled 2023-06-02: qty 2

## 2023-06-02 MED ORDER — SODIUM CHLORIDE 0.9 % IV SOLN: 250.0000 mL | INTRAVENOUS | Status: DC | PRN

## 2023-06-02 MED ORDER — SODIUM CHLORIDE 0.9 % WEIGHT BASED INFUSION
1.0000 mL/kg/h | INTRAVENOUS | Status: DC
  Administered 2023-06-02: 1 mL/kg/h via INTRAVENOUS

## 2023-06-02 MED ORDER — LABETALOL HCL 5 MG/ML IV SOLN: 10.0000 mg | INTRAVENOUS | Status: DC | PRN

## 2023-06-02 MED ORDER — IOHEXOL 300 MG/ML  SOLN
INTRAMUSCULAR | Status: DC | PRN
  Administered 2023-06-02: 34 mL

## 2023-06-02 MED ORDER — SODIUM CHLORIDE 0.9 % WEIGHT BASED INFUSION: 3.0000 mL/kg/h | INTRAVENOUS | Status: DC

## 2023-06-02 MED ORDER — MIDAZOLAM HCL 2 MG/2ML IJ SOLN
INTRAMUSCULAR | Status: DC | PRN
  Administered 2023-06-02: 1 mg via INTRAVENOUS

## 2023-06-02 MED ORDER — ACETAMINOPHEN 325 MG PO TABS: 650.0000 mg | ORAL_TABLET | ORAL | Status: DC | PRN

## 2023-06-02 MED ORDER — HEPARIN (PORCINE) IN NACL 1000-0.9 UT/500ML-% IV SOLN
INTRAVENOUS | Status: AC
  Filled 2023-06-02: qty 1000

## 2023-06-02 MED ORDER — SODIUM CHLORIDE 0.9 % WEIGHT BASED INFUSION
3.0000 mL/kg/h | INTRAVENOUS | Status: AC
  Administered 2023-06-02: 3 mL/kg/h via INTRAVENOUS

## 2023-06-02 MED ORDER — HEPARIN SODIUM (PORCINE) 1000 UNIT/ML IJ SOLN
INTRAMUSCULAR | Status: AC
  Filled 2023-06-02: qty 10

## 2023-06-02 MED ORDER — SODIUM CHLORIDE 0.9 % WEIGHT BASED INFUSION: 1.0000 mL/kg/h | INTRAVENOUS | Status: DC

## 2023-06-02 MED ORDER — LIDOCAINE HCL (PF) 1 % IJ SOLN
INTRAMUSCULAR | Status: DC | PRN
  Administered 2023-06-02: 5 mL

## 2023-06-02 MED ORDER — ONDANSETRON HCL 4 MG/2ML IJ SOLN: 4.0000 mg | Freq: Four times a day (QID) | INTRAMUSCULAR | Status: DC | PRN

## 2023-06-02 MED ORDER — HEPARIN SODIUM (PORCINE) 1000 UNIT/ML IJ SOLN
INTRAMUSCULAR | Status: DC | PRN
  Administered 2023-06-02: 4000 [IU] via INTRAVENOUS

## 2023-06-02 MED ORDER — VERAPAMIL HCL 2.5 MG/ML IV SOLN
INTRAVENOUS | Status: AC
  Filled 2023-06-02: qty 2

## 2023-06-02 MED ORDER — SODIUM CHLORIDE 0.9% FLUSH: 3.0000 mL | INTRAVENOUS | Status: DC | PRN

## 2023-06-02 SURGICAL SUPPLY — 10 items
CATH 5F 110X4 TIG (CATHETERS) IMPLANT
DEVICE RAD TR BAND REGULAR (VASCULAR PRODUCTS) IMPLANT
DRAPE BRACHIAL (DRAPES) IMPLANT
GLIDESHEATH SLEND SS 6F .021 (SHEATH) IMPLANT
GUIDEWIRE INQWIRE 1.5J.035X260 (WIRE) IMPLANT
INQWIRE 1.5J .035X260CM (WIRE) ×1
PACK CARDIAC CATH (CUSTOM PROCEDURE TRAY) ×1 IMPLANT
PROTECTION STATION PRESSURIZED (MISCELLANEOUS) ×1
SET ATX-X65L (MISCELLANEOUS) IMPLANT
STATION PROTECTION PRESSURIZED (MISCELLANEOUS) IMPLANT

## 2023-06-02 NOTE — Interval H&P Note (Signed)
History and Physical Interval Note:  06/02/2023 10:36 AM  Ricardo Nash  has presented today for surgery, with the diagnosis of cardiomyopathy.  The various methods of treatment have been discussed with the patient and family. After consideration of risks, benefits and other options for treatment, the patient has consented to  Procedure(s): LEFT HEART CATH AND CORONARY ANGIOGRAPHY (Left) as a surgical intervention.  The patient's history has been reviewed, patient examined, no change in status, stable for surgery.  I have reviewed the patient's chart and labs.  Questions were answered to the patient's satisfaction.    Cath Lab Visit (complete for each Cath Lab visit)  Clinical Evaluation Leading to the Procedure:   ACS: No.  Non-ACS:    Anginal Classification: No Symptoms  Anti-ischemic medical therapy: Maximal Therapy (2 or more classes of medications)  Non-Invasive Test Results: No non-invasive testing performed  Prior CABG: No previous CABG  Ricardo Nash

## 2023-06-03 ENCOUNTER — Other Ambulatory Visit: Payer: Self-pay | Admitting: Internal Medicine

## 2023-06-04 ENCOUNTER — Encounter: Payer: Self-pay | Admitting: Internal Medicine

## 2023-06-04 ENCOUNTER — Ambulatory Visit: Payer: Medicare PPO | Admitting: Internal Medicine

## 2023-06-04 VITALS — BP 120/60 | HR 76 | Temp 97.9°F | Resp 17 | Ht 69.0 in | Wt 178.2 lb

## 2023-06-04 DIAGNOSIS — I10 Essential (primary) hypertension: Secondary | ICD-10-CM

## 2023-06-04 DIAGNOSIS — I700 Atherosclerosis of aorta: Secondary | ICD-10-CM

## 2023-06-04 DIAGNOSIS — G4733 Obstructive sleep apnea (adult) (pediatric): Secondary | ICD-10-CM

## 2023-06-04 DIAGNOSIS — R197 Diarrhea, unspecified: Secondary | ICD-10-CM | POA: Diagnosis not present

## 2023-06-04 DIAGNOSIS — R0981 Nasal congestion: Secondary | ICD-10-CM | POA: Diagnosis not present

## 2023-06-04 DIAGNOSIS — K219 Gastro-esophageal reflux disease without esophagitis: Secondary | ICD-10-CM

## 2023-06-04 DIAGNOSIS — E7800 Pure hypercholesterolemia, unspecified: Secondary | ICD-10-CM | POA: Diagnosis not present

## 2023-06-04 DIAGNOSIS — F439 Reaction to severe stress, unspecified: Secondary | ICD-10-CM

## 2023-06-04 DIAGNOSIS — R194 Change in bowel habit: Secondary | ICD-10-CM | POA: Diagnosis not present

## 2023-06-04 DIAGNOSIS — I2510 Atherosclerotic heart disease of native coronary artery without angina pectoris: Secondary | ICD-10-CM | POA: Diagnosis not present

## 2023-06-04 DIAGNOSIS — I429 Cardiomyopathy, unspecified: Secondary | ICD-10-CM

## 2023-06-04 DIAGNOSIS — I480 Paroxysmal atrial fibrillation: Secondary | ICD-10-CM

## 2023-06-04 DIAGNOSIS — E1159 Type 2 diabetes mellitus with other circulatory complications: Secondary | ICD-10-CM | POA: Diagnosis not present

## 2023-06-04 LAB — MICROALBUMIN / CREATININE URINE RATIO
Creatinine,U: 84.1 mg/dL
Microalb Creat Ratio: 2.8 mg/g (ref 0.0–30.0)
Microalb, Ur: 2.3 mg/dL — ABNORMAL HIGH (ref 0.0–1.9)

## 2023-06-04 MED ORDER — DOXYCYCLINE HYCLATE 100 MG PO TABS: 100.0000 mg | ORAL_TABLET | Freq: Two times a day (BID) | ORAL | 0 refills | Status: DC

## 2023-06-04 MED ORDER — MONTELUKAST SODIUM 10 MG PO TABS: 10.0000 mg | ORAL_TABLET | Freq: Every day | ORAL | 2 refills | Status: DC

## 2023-06-04 MED ORDER — ALPRAZOLAM 0.25 MG PO TABS: 0.2500 mg | ORAL_TABLET | Freq: Every day | ORAL | 0 refills | Status: DC | PRN

## 2023-06-04 NOTE — Assessment & Plan Note (Signed)
Continue diet and exercise.  Follow met b and a1c. Low carb diet and exercise.  Sugars as outlined. Recent A1c 6.5.

## 2023-06-04 NOTE — Assessment & Plan Note (Signed)
Continue lipitor.  Low cholesterol diet and exercise.  Follow lipid panel and liver function tests.   Lab Results  Component Value Date   CHOL 117 05/26/2023   HDL 37 (L) 05/26/2023   LDLCALC 49 05/26/2023   LDLDIRECT 72.0 10/08/2022   TRIG 185 (H) 05/26/2023   CHOLHDL 3.2 05/26/2023

## 2023-06-04 NOTE — Assessment & Plan Note (Signed)
Previously noted to be in afib when sick in hospital.  Prior to this most recent hospitalization he saw a new cardiologist.  She recommended starting eliquis.  He is hesitant to start.  Was told by cardiology previously - did not need.  Reports had isolated afib when sick in hospital.  He is on aspirin.  No increased heart rate or palpitations.  Breathing stable. Recent holter no afib. Planning for loop recorder.  Continue aspirin.

## 2023-06-04 NOTE — Assessment & Plan Note (Signed)
Persistent symptoms despite medications as outlined.  Add doxycycline as directed. Probiotics as directed.  Follow.

## 2023-06-04 NOTE — Assessment & Plan Note (Signed)
Continue zoloft. Has good support.  Follow.  Doing better.

## 2023-06-04 NOTE — Assessment & Plan Note (Signed)
Continue carvedilol and amlodipine.  On spironolactone. Follow pressures.  Follow metabolic panel.

## 2023-06-04 NOTE — Assessment & Plan Note (Signed)
No upper symptoms reported.  Continue protonix.  

## 2023-06-04 NOTE — Assessment & Plan Note (Signed)
Continue aspirin, lipitor and carvedilol.  No chest pain. Continue risk factor modification. Recent heart cath as outlined.

## 2023-06-04 NOTE — Assessment & Plan Note (Signed)
Continues on lipitor.  

## 2023-06-04 NOTE — Assessment & Plan Note (Signed)
Has lost weight.  Would like to be evaluated to confirm if need for continued cpap. Refer to pulmonary for evaluation.

## 2023-06-04 NOTE — Assessment & Plan Note (Signed)
Doing better with metamucil. Planning colonoscopy in 07/2023.

## 2023-06-04 NOTE — Progress Notes (Signed)
Subjective:    Patient ID: Ricardo Nash, male    DOB: [birth year], 78 y.o.   MRN: [id number]  Patient here for  Chief Complaint  Patient presents with   Medical Management of Chronic Issues    3 mth f/u    HPI Here for a scheduled follow up.  Here to follow up regarding hypertension, diabetes, afib and hypercholesterolemia. Had f/u with urology 03/04/23 - recommended continuing flomax and decrease dose of testosterone. Saw cardiology 03/30/23 - recommended to continue coreg, aspirin. Refer to afib clinic and recommended cardiac monitor to evaluate if afib present. Also recommended echo. Monitor - paroxysmal SVT noted with 3 episodes of nonsustained VT. No afib/aflutter. ECHO - EF 35-40% with moderate concentric LVH. Mild MR. Recommended ischemic w/up and loop recorder implant. S/p left heart cath 06/02/23 - moderate two vessel CAD with 50-60% mild LAD stenosis and 40-50% proximal/mid dominant LCx lesion. Saw GI 04/30/23 - episodes of constipation and diarrhea. Suspect overflow diarrhea.  Recommended abdominal xray and starting metamucil. He is taking metamucil regularly and bowels are doing well.  Also scheduled for colonoscopy 07/2023. States he feels better now than he has in a long time. Breathing stable. Recent cough and congestion. Cough is better. Is having increased sinus pressure and nasal congestion - productive of colored mucus - despite using his prescription nasal spray and saline flushes. Feels similar to his previous sinus infections.  Spinal cord stimulator working well.  Left knee pain - planning to see ortho.   Past Medical History:  Diagnosis Date   C. difficile colitis    Coronary artery disease    Depression    Diabetes mellitus (HCC)    Diverticulitis    Diverticulosis    Dysrhythmia    PAF   Environmental allergies    GERD (gastroesophageal reflux disease)    Goiter    intrathoracic, s/p benign biopsy (Dr Gerrit Friends)   Hypercholesterolemia    Hypertension     Osteoarthritis    cervical spine, lumbar spine   Paroxysmal atrial fibrillation (HCC)    Pneumonia    PONV (postoperative nausea and vomiting)    Sleep apnea    CPAP @ NIGHT   Stroke [facility])    TIA (transient ischemic attack)    Past Surgical History:  Procedure Laterality Date   BACK SURGERY  12/2006   s/p fusion of L4-L5   BACK SURGERY  2024   capsule endoscopy     CATARACT EXTRACTION  2011   Dr. Elmer Picker   CERVICAL DISC SURGERY  2002   CHOLECYSTECTOMY  2024   COLONOSCOPY WITH PROPOFOL N/A 07/20/2017   Procedure: COLONOSCOPY WITH PROPOFOL;  Surgeon: Scot Jun, MD;  Location: [facility] ENDOSCOPY;  Service: Endoscopy;  Laterality: N/A;   EYE SURGERY     cataract bilateral 10/1999   eyelid reduction     Dr. Beryle Flock HERNIA REPAIR  1991   Dr, Katrinka Blazing   LEFT HEART CATH AND CORONARY ANGIOGRAPHY Left 06/02/2023   Procedure: LEFT HEART CATH AND CORONARY ANGIOGRAPHY;  Surgeon: Yvonne Kendall, MD;  Location: [facility] INVASIVE CV LAB;  Service: Cardiovascular;  Laterality: Left;   LUMBAR LAMINECTOMY/DECOMPRESSION MICRODISCECTOMY N/A 10/15/2020   Procedure: Laminectomy - Lumbar Two-Lumbar Three - Lumbar Three-Lumbar Four with sublaminar decompression;  Surgeon: Tia Alert, MD;  Location: [facility] OR;  Service: Neurosurgery;  Laterality: N/A;  Laminectomy - Lumbar Two-Lumbar Three - Lumbar Three-Lumbar Four with sublaminar decompression   NOSE SURGERY     turbinate reduction  SEPTOPLASTY  1975   SHOULDER SURGERY  2000   rotator cuff   Family History  Problem Relation Age of Onset   Congestive Heart Failure Father    Heart disease Father        myocardial infarction   Rheumatic fever Father        valvular disease   Heart disease Mother        s/p CABG (age 17)   Kidney disease Sister    Colon cancer Neg Hx    Prostate cancer Neg Hx    Social History   Socioeconomic History   Marital status: Married    Spouse name: Not on file   Number of children: 2   Years of education:  Not on file   Highest education level: Not on file  Occupational History   Occupation: retired Runner, broadcasting/film/video  Tobacco Use   Smoking status: Former    Passive exposure: Never   Smokeless tobacco: Never  Vaping Use   Vaping status: Never Used  Substance and Sexual Activity   Alcohol use: Yes    Alcohol/week: 0.0 standard drinks of alcohol    Comment: occasional   Drug use: No   Sexual activity: Not Currently  Other Topics Concern   Not on file  Social History Narrative   Not on file   Social Drivers of Health   Financial Resource Strain: Low Risk  (04/30/2023)   Received from [facility]   Overall Financial Resource Strain (CARDIA)    Difficulty of Paying Living Expenses: Not hard at all  Food Insecurity: No Food Insecurity (04/30/2023)   [facility]   Hunger Vital Sign    Worried About Running Out of Food in the Last Year: Never true    Ran Out of Food in the Last Year: Never true  Transportation Needs: No Transportation Needs (04/30/2023)   Received from [facility] - Transportation    In the past 12 months, has lack of transportation kept you from medical appointments or from getting medications?: No    Lack of Transportation (Non-Medical): No  Physical Activity: Insufficiently Active (06/30/2022)   Exercise Vital Sign    Days of Exercise per Week: 2 days    Minutes of Exercise per Session: 60 min  Stress: No Stress Concern Present (06/30/2022)   [employer] of Occupational Health - Occupational Stress Questionnaire    Feeling of Stress : Only a little  Social Connections: Unknown (06/30/2022)   Social Connection and Isolation Panel [NHANES]    Frequency of Communication with Friends and Family: Not on file    Frequency of Social Gatherings with Friends and Family: Not on file    Attends Religious Services: Not on file    Active Member of Clubs or Organizations: Not on file    Attends Tax inspector Meetings: Not on file    Marital Status: Married     Review of Systems  Constitutional:  Negative for unexpected weight change.       Eating regular meals.   HENT:  Positive for congestion, postnasal drip and sinus pressure.   Respiratory:  Negative for cough, chest tightness and shortness of breath.   Cardiovascular:  Negative for chest pain, palpitations and leg swelling.  Gastrointestinal:  Negative for abdominal pain, diarrhea, nausea and vomiting.  Genitourinary:  Negative for difficulty urinating and dysuria.  Musculoskeletal:  Negative for joint swelling and myalgias.  Left knee pain.   Skin:  Negative for color change and rash.  Neurological:  Negative for dizziness and headaches.  Psychiatric/Behavioral:  Negative for agitation and dysphoric mood.        Objective:     BP 120/60 (Cuff Size: Normal)   Pulse 76   Temp 97.9 F (36.6 C) (Oral)   Resp 17   Ht 5\' 9"  (1.753 m)   Wt 178 lb 4 oz (80.9 kg)   SpO2 99%   BMI 26.32 kg/m  Wt Readings from Last 3 Encounters:  06/04/23 178 lb 4 oz (80.9 kg)  06/02/23 173 lb (78.5 kg)  05/26/23 181 lb 9.6 oz (82.4 kg)    Physical Exam Constitutional:      General: He is not in acute distress.    Appearance: Normal appearance. He is well-developed.  HENT:     Head: Normocephalic and atraumatic.     Right Ear: External ear normal.     Left Ear: External ear normal.     Mouth/Throat:     Pharynx: No posterior oropharyngeal erythema.  Eyes:     General: No scleral icterus.       Right eye: No discharge.        Left eye: No discharge.  Cardiovascular:     Rate and Rhythm: Normal rate and regular rhythm.  Pulmonary:     Effort: Pulmonary effort is normal. No respiratory distress.     Breath sounds: Normal breath sounds.  Abdominal:     General: Bowel sounds are normal.     Palpations: Abdomen is soft.     Tenderness: There is no abdominal tenderness.  Musculoskeletal:        General: No swelling or  tenderness.     Cervical back: Neck supple. No tenderness.  Lymphadenopathy:     Cervical: No cervical adenopathy.  Skin:    Findings: No erythema or rash.  Neurological:     Mental Status: He is alert.  Psychiatric:        Mood and Affect: Mood normal.        Behavior: Behavior normal.         Outpatient Encounter Medications as of 06/04/2023  Medication Sig   ACCU-CHEK GUIDE TEST test strip USE AS DIRECTED TO TEST BLOOD GLUCOSE LEVELS TWICE DAILY   amLODipine (NORVASC) 10 MG tablet TAKE 1 TABLET BY MOUTH ONCE DAILY   aspirin EC 81 MG tablet Take 81 mg by mouth every evening. Swallow whole.   atorvastatin (LIPITOR) 20 MG tablet TAKE 1 TABLET BY MOUTH ONCE EVERY EVENING   azelastine (OPTIVAR) 0.05 % ophthalmic solution Apply to eye.   Azelastine-Fluticasone 137-50 MCG/ACT SUSP PLACE 1 SPRAY INTO EACH NOSTRIL ONCE EVERY MORNING AND AT BEDTIME   blood glucose meter kit and supplies Dispense based on patient and insurance preference. Use up to four times daily as directed. (FOR ICD-10 E10.9, E11.9).   BOOSTRIX 5-2.5-18.5 LF-MCG/0.5 injection Inject 0.5 mLs into the muscle once.   carvedilol (COREG) 25 MG tablet TAKE 1 TABLET BY MOUTH TWICE DAILY WITH MEALS   dapagliflozin propanediol (FARXIGA) 10 MG TABS tablet Take 1 tablet (10 mg total) by mouth daily before breakfast.   doxycycline (VIBRA-TABS) 100 MG tablet Take 1 tablet (100 mg total) by mouth 2 (two) times daily.   fluorometholone (FML) 0.1 % ophthalmic suspension SMARTSIG:In Eye(s)   levocetirizine (XYZAL) 5 MG tablet TAKE 1 TABLET BY MOUTH ONCE EVERY EVENING   metFORMIN (GLUCOPHAGE) 1000 MG tablet Take 1  tablet (1,000 mg total) by mouth 2 (two) times daily with a meal.   Multiple Vitamin (MULTIVITAMIN WITH MINERALS) TABS tablet Take 1 tablet by mouth in the morning.   ondansetron (ZOFRAN) 4 MG tablet Take 1 tablet (4 mg total) by mouth every 8 (eight) hours as needed for nausea or vomiting.   pantoprazole (PROTONIX) 40 MG  tablet TAKE 1 TABLET BY MOUTH ONCE DAILY   pioglitazone (ACTOS) 15 MG tablet TAKE 1 TABLET BY MOUTH ONCE DAILY   sertraline (ZOLOFT) 100 MG tablet TAKE 1 TABLET BY MOUTH ONCE DAILY   spironolactone (ALDACTONE) 25 MG tablet Take 1 tablet (25 mg total) by mouth daily.   tamsulosin (FLOMAX) 0.4 MG CAPS capsule Take 2 capsules (0.8 mg total) by mouth daily after supper.   testosterone cypionate (DEPOTESTOSTERONE CYPIONATE) 200 MG/ML injection INJECT 0.5MLS INTRAMUSCULARLY EVERY 14 DAYS   traZODone (DESYREL) 50 MG tablet Take 1 tablet (50 mg total) by mouth at bedtime as needed for sleep.   TRULICITY 3 MG/0.5ML SOAJ INJECT 3MG  SUBCUTANEOUSLY ONCE A WEEK   [DISCONTINUED] ALPRAZolam (XANAX) 0.25 MG tablet TAKE 1 TABLET BY MOUTH ONCE DAILY AS NEEDED   [DISCONTINUED] montelukast (SINGULAIR) 10 MG tablet TAKE 1 TABLET BY MOUTH AT BEDTIME   ALPRAZolam (XANAX) 0.25 MG tablet Take 1 tablet (0.25 mg total) by mouth daily as needed.   montelukast (SINGULAIR) 10 MG tablet Take 1 tablet (10 mg total) by mouth at bedtime.   [DISCONTINUED] Ascorbic Acid (VITAMIN C) 100 MG tablet Take 100 mg by mouth in the morning. (Patient not taking: Reported on 06/02/2023)   [DISCONTINUED] docusate sodium (COLACE) 100 MG capsule Take 100 mg by mouth daily.   [DISCONTINUED] lisinopril (ZESTRIL) 40 MG tablet TAKE 1 TABLET BY MOUTH ONCE DAILY (Patient not taking: Reported on 06/02/2023)   [DISCONTINUED] pregabalin (LYRICA) 100 MG capsule Take 100 mg by mouth 2 (two) times daily. (Patient not taking: Reported on 06/02/2023)   No facility-administered encounter medications on file as of 06/04/2023.     Lab Results  Component Value Date   WBC 8.8 05/26/2023   HGB 13.1 05/26/2023   HCT 40.4 05/26/2023   PLT 242 05/26/2023   GLUCOSE 147 (H) 05/26/2023   CHOL 117 05/26/2023   TRIG 185 (H) 05/26/2023   HDL 37 (L) 05/26/2023   LDLDIRECT 72.0 10/08/2022   LDLCALC 49 05/26/2023   ALT 19 05/26/2023   AST 20 05/26/2023   NA 145 (H)  05/26/2023   K 4.7 05/26/2023   CL 106 05/26/2023   CREATININE 1.00 05/26/2023   BUN 17 05/26/2023   CO2 23 05/26/2023   TSH 0.89 10/08/2022   PSA 0.82 06/10/2019   INR 0.9 10/11/2020   HGBA1C 6.5 (H) 05/26/2023   MICROALBUR 3.2 (H) 02/06/2022    CARDIAC CATHETERIZATION Result Date: 06/02/2023 Conclusions: Moderate two-vessel coronary artery disease, as detailed below, with 50-60% mid LAD stenosis and 40-50% proximal/mid dominant LCx lesion. Normal left ventricular filling pressure (LVEDP 14 mmHg). Recommendations: Continue escalation of goal-directed medical therapy in the setting of nonischemic cardiomyopathy. Continue medical therapy and risk factor modification to prevent progression of coronary artery disease.  If the patient were to develop angina, functional study could be considered to assess the functional significance of LAD and LCx disease. Yvonne Kendall, MD [facility]      Assessment & Plan:  Aortic atherosclerosis (HCC) Assessment & Plan: Continues on lipitor.    Type 2 diabetes mellitus with other circulatory complication, without long-term current use of insulin (  HCC) Assessment & Plan: Continue diet and exercise.  Follow met b and a1c. Low carb diet and exercise.  Sugars as outlined. Recent A1c 6.5.   Orders: -     Microalbumin / creatinine urine ratio  Coronary artery disease involving native coronary artery of native heart without angina pectoris Assessment & Plan: Continue aspirin, lipitor and carvedilol.  No chest pain. Continue risk factor modification. Recent heart cath as outlined.    Cardiomyopathy, unspecified type (HCC)  Change in bowel habits Assessment & Plan: Doing better with metamucil. Planning colonoscopy in 07/2023.    Congestion of nasal sinus Assessment & Plan: Persistent symptoms despite medications as outlined.  Add doxycycline as directed. Probiotics as directed.  Follow.    Diarrhea, unspecified type Assessment &  Plan: Doing better on metamucil.    Gastroesophageal reflux disease, unspecified whether esophagitis present Assessment & Plan: No upper symptoms reported.  Continue protonix.    Hypercholesterolemia Assessment & Plan: Continue lipitor.  Low cholesterol diet and exercise.  Follow lipid panel and liver function tests.   Lab Results  Component Value Date   CHOL 117 05/26/2023   HDL 37 (L) 05/26/2023   LDLCALC 49 05/26/2023   LDLDIRECT 72.0 10/08/2022   TRIG 185 (H) 05/26/2023   CHOLHDL 3.2 05/26/2023      Primary hypertension Assessment & Plan: Continue carvedilol and amlodipine.  On spironolactone. Follow pressures.  Follow metabolic panel.    Obstructive sleep apnea Assessment & Plan: Has lost weight.  Would like to be evaluated to confirm if need for continued cpap. Refer to pulmonary for evaluation.   Orders: -     Ambulatory referral to Pulmonology  Paroxysmal atrial fibrillation [facility]) Assessment & Plan: Previously noted to be in afib when sick in hospital.  Prior to this most recent hospitalization he saw a new cardiologist.  She recommended starting eliquis.  He is hesitant to start.  Was told by cardiology previously - did not need.  Reports had isolated afib when sick in hospital.  He is on aspirin.  No increased heart rate or palpitations.  Breathing stable. Recent holter no afib. Planning for loop recorder.  Continue aspirin.    Stress Assessment & Plan: Continue zoloft. Has good support.  Follow.  Doing better.    Other orders -     ALPRAZolam; Take 1 tablet (0.25 mg total) by mouth daily as needed.  Dispense: 30 tablet; Refill: 0 -     Montelukast Sodium; Take 1 tablet (10 mg total) by mouth at bedtime.  Dispense: 90 tablet; Refill: 2 -     Doxycycline Hyclate; Take 1 tablet (100 mg total) by mouth 2 (two) times daily.  Dispense: 14 tablet; Refill: 0     Dale , MD

## 2023-06-04 NOTE — Assessment & Plan Note (Signed)
Doing better on metamucil.

## 2023-06-05 ENCOUNTER — Ambulatory Visit: Payer: Medicare PPO | Attending: Cardiology | Admitting: Cardiology

## 2023-06-05 ENCOUNTER — Encounter: Payer: Self-pay | Admitting: Internal Medicine

## 2023-06-05 ENCOUNTER — Other Ambulatory Visit: Payer: Self-pay

## 2023-06-05 VITALS — BP 150/62 | HR 73 | Wt 180.0 lb

## 2023-06-05 DIAGNOSIS — I110 Hypertensive heart disease with heart failure: Secondary | ICD-10-CM | POA: Diagnosis not present

## 2023-06-05 DIAGNOSIS — I447 Left bundle-branch block, unspecified: Secondary | ICD-10-CM | POA: Diagnosis not present

## 2023-06-05 DIAGNOSIS — I429 Cardiomyopathy, unspecified: Secondary | ICD-10-CM | POA: Diagnosis not present

## 2023-06-05 DIAGNOSIS — I5020 Unspecified systolic (congestive) heart failure: Secondary | ICD-10-CM

## 2023-06-05 DIAGNOSIS — I5022 Chronic systolic (congestive) heart failure: Secondary | ICD-10-CM | POA: Diagnosis not present

## 2023-06-05 DIAGNOSIS — E119 Type 2 diabetes mellitus without complications: Secondary | ICD-10-CM | POA: Insufficient documentation

## 2023-06-05 DIAGNOSIS — I4891 Unspecified atrial fibrillation: Secondary | ICD-10-CM | POA: Insufficient documentation

## 2023-06-05 DIAGNOSIS — Z7984 Long term (current) use of oral hypoglycemic drugs: Secondary | ICD-10-CM | POA: Diagnosis not present

## 2023-06-05 DIAGNOSIS — Z79899 Other long term (current) drug therapy: Secondary | ICD-10-CM | POA: Insufficient documentation

## 2023-06-05 MED ORDER — ATORVASTATIN CALCIUM 40 MG PO TABS: ORAL_TABLET | ORAL | 0 refills | Status: DC

## 2023-06-05 MED ORDER — LOSARTAN POTASSIUM 50 MG PO TABS: 50.0000 mg | ORAL_TABLET | Freq: Every day | ORAL | 6 refills | Status: DC

## 2023-06-05 NOTE — Patient Instructions (Signed)
Great to see you today!!!  Medication Changes:  STOP Amlodipine  START Losartan 50 mg Daily  Special Instructions // Education:  Do the following things EVERYDAY: Weigh yourself in the morning before breakfast. Write it down and keep it in a log. Take your medicines as prescribed Eat low salt foods--Limit salt (sodium) to 2000 mg per day.  Stay as active as you can everyday Limit all fluids for the day to less than 2 liters   Follow-Up in: 3 months (April), we will call you closer to this time to schedule    If you have any questions or concerns before your next appointment please send [place] a message through [place] or call our office at [phone] Monday-Friday 8 am-5 pm.   If you have an urgent need after hours on the weekend please call your Primary Cardiologist or the [facility] in [place] at [phone].   At the [facility], you and your health needs are our priority. We have a designated team specialized in the treatment of Heart Failure. This Care Team includes your primary Heart Failure Specialized Cardiologist (physician), Advanced Practice Providers (APPs- Physician Assistants and Nurse Practitioners), and Pharmacist who all work together to provide you with the care you need, when you need it.   You may see any of the following providers on your designated Care Team at your next follow up:  Dr. Arvilla Meres Dr. Marca Ancona Dr. Dorthula Nettles Dr. Theresia Bough Tonye Becket, NP Robbie Lis, [place] [address] [place], [place] Brynda Peon, NP [place] Lee, NP Clarisa Kindred, NP Enos Fling, PharmD

## 2023-06-05 NOTE — Progress Notes (Signed)
ADVANCED HEART FAILURE NEW PATIENT CLINIC NOTE  Referring Physician: Dale Warren, MD  Primary Care: Dale [place], MD Primary Cardiologist:  HPI: Ricardo Nash is a 78 y.o. male with a PMH of  h/o 1 episode of atrial fibrillation in 2003, hypertension, diabetes, LBBB, chronic systolic heart failure  who presents for initial visit for further evaluation and treatment of heart failure/cardiomyopathy.      Echo 06/2021 EF 40 to 45% reported.  Septal bounce, consistent with bundle branch block. Exercise MPI 12/2017 no evidence for ischemia  Echo 05/20/2023 with LVEF 35-40%, bundle branch block, moderate LVH, normal RV function  First diagnosed with atrial fibrillation in 2002 after recovering from neck surgery.  To his knowledge, has not had any episodes since.  This appears to have been a lone postoperative episode.  He was doing really well up until last July (of 2024) when he underwent back surgery and quickly thereafter had to have gallbladder surgery.  Before this he was walking 2 miles per day at the [facility].      SUBJECTIVE: Patient overall states that he is doing fairly well.  He has never been hospitalized for heart failure or displayed heart failure symptoms including orthopnea, PND, shortness of breath with exertion, chest pain.  He is still recovering after gallbladder surgery and back surgery, but has no active complaints.  PMH, current medications, allergies, social history, and family history reviewed in epic.  PHYSICAL EXAM: Vitals:   06/05/23 1029  BP: (!) 150/62  Pulse: 73  SpO2: 98%   GENERAL: Well nourished and in no apparent distress at rest.  HEENT: The mucous membranes are pink and moist.   PULM:  Normal work of breathing, clear to auscultation bilaterally. Respirations are unlabored.  CARDIAC:  JVP: Not elevated         Normal rate with regular rhythm. No murmurs, rubs or gallops.  No lower extremity edema.  ABDOMEN: Soft, non-tender,  non-distended. NEUROLOGIC: Patient is oriented x3 with no focal or lateralizing neurologic deficits.  PSYCH: Patients affect is appropriate, there is no evidence of anxiety or depression.  SKIN: Warm and dry; no lesions or wounds. Warm and well perfused extremities.  DATA REVIEW  ECG: LBBB    ECHO: 05/20/2023 with LVEF 35-40%, bundle branch block, moderate LVH, normal RV function  CATH: 05/2023: Moderate LAD and Lcx disease, normal filling pressure    Heart failure review: - Classification: Heart failure with mildly reduced EF - Etiology: Cardiomyopathy due to ischemia versus LBBB - NYHA Class: I - Volume status: Euvolemic - ACEi/ARB/ARNI: Currently up-titrating - Aldosterone antagonist: Maximally tolerated dose - Beta-blocker: Maximally tolerated dose - Digoxin: Not indicated - Hydralazine/Nitrates: Not indicated - SGLT2i: Maximally tolerated dose - GLP-1: Consider in future - Advanced therapies: Not needed at this time - ICD: Currently uptitrating GDMT   ASSESSMENT & PLAN:  ACC/AHA Stage B HF: Patient with no symptoms of heart failure but mildly reduced ejection fraction noted on serial echocardiograms.  He has been evaluated by electrophysiology with planned loop recorder placement given history of A-fib.  Etiology suspected to be due to left bundle branch block, but could also consider hypertension.  Will transition amlodipine to losartan, he is not in need of as needed diuretics. -Transition amlodipine to losartan, recent visit with controlled blood pressure -Follow-up BMP -Continue carvedilol 25 mg twice daily -Continue Farxiga 10 mg daily, spironolactone 25 mg daily -Titrate medical therapy to goal dose and control blood pressure, can reassess echocardiogram at that time -  Given lack of symptoms, benefit of CRT is questionable  Atrial fibrillation: Not on anticoagulation, loop recorder placement planned -Follow-up with EP  I spent 45 minutes caring for this patient  today including face to face time, ordering and reviewing labs, reviewing records from EP and general cardiology visits, seeing the patient, documenting in the record, and arranging follow ups.  Follow up in 3 months  Clearnce Hasten, MD Advanced Heart Failure Mechanical Circulatory Support 06/05/23

## 2023-06-08 ENCOUNTER — Other Ambulatory Visit: Payer: Self-pay | Admitting: Internal Medicine

## 2023-06-08 ENCOUNTER — Other Ambulatory Visit: Payer: Self-pay

## 2023-06-08 ENCOUNTER — Encounter: Payer: Self-pay | Admitting: Internal Medicine

## 2023-06-08 DIAGNOSIS — R32 Unspecified urinary incontinence: Secondary | ICD-10-CM

## 2023-06-08 DIAGNOSIS — N401 Enlarged prostate with lower urinary tract symptoms: Secondary | ICD-10-CM

## 2023-06-08 DIAGNOSIS — N3281 Overactive bladder: Secondary | ICD-10-CM

## 2023-06-09 ENCOUNTER — Other Ambulatory Visit
Admission: RE | Admit: 2023-06-09 | Discharge: 2023-06-09 | Disposition: A | Discharge status: Home / Self Care | Payer: Medicare PPO | Attending: Urology | Admitting: Urology

## 2023-06-09 ENCOUNTER — Ambulatory Visit: Payer: Medicare PPO | Admitting: Urology

## 2023-06-09 VITALS — BP 144/65 | HR 96 | Ht 69.0 in | Wt 175.0 lb

## 2023-06-09 DIAGNOSIS — R32 Unspecified urinary incontinence: Secondary | ICD-10-CM | POA: Insufficient documentation

## 2023-06-09 DIAGNOSIS — N138 Other obstructive and reflux uropathy: Secondary | ICD-10-CM | POA: Insufficient documentation

## 2023-06-09 DIAGNOSIS — N401 Enlarged prostate with lower urinary tract symptoms: Secondary | ICD-10-CM | POA: Insufficient documentation

## 2023-06-09 DIAGNOSIS — R399 Unspecified symptoms and signs involving the genitourinary system: Secondary | ICD-10-CM

## 2023-06-09 DIAGNOSIS — N3281 Overactive bladder: Secondary | ICD-10-CM | POA: Diagnosis not present

## 2023-06-09 DIAGNOSIS — E291 Testicular hypofunction: Secondary | ICD-10-CM | POA: Diagnosis not present

## 2023-06-09 LAB — BLADDER SCAN AMB NON-IMAGING

## 2023-06-09 NOTE — Progress Notes (Signed)
   06/09/2023 9:44 AM   Ricardo Nash 12-26-1945 [id number]  Reason for visit: Follow up low testosterone, ED, urinary symptoms/BPH/OAB  HPI: Comorbid 78 year old male previously followed at [facility] urology for the above issues.  Comorbidities include sleep apnea, diabetes(recent hemoglobin A1c 7), hypertension, depression.  He has been on testosterone injections every 2 weeks long-term and PSA has remained low, ~1.  He reports significant improvement on the testosterone and his mood, energy, and overall quality of life.  He has failed PDE 5 inhibitors and injections with Trimix for ED, and he is no longer interested in pursuing treatment option for ED. He is on Flomax 0.8 mg nightly which he feels improves his urgency/frequency and weak stream, has nocturia 0-1 time overnight.    At our visit in October 2024 we decreased his testosterone dose to 100 mg every 14 days, as with diet and lifestyle strategies he had lost weight and diabetes control had improved, testosterone levels had increased on the higher dose.  Testosterone level is pending today.    He also has had worsening urinary symptoms over the last year.  PVR was elevated in October 2024 385ml-I offered cystoscopy/TRUS for further evaluation and consideration of outlet procedures but he deferred.  He opted to try the lower dose of testosterone and see if this improves his emptying.  He continues on Flomax 0.8 mg nightly.  He does feel his urination has improved significantly since decreasing his testosterone dose, PVR today improved to and he really denies any significant urinary complaints.  Return precautions discussed extensively including UTIs, retention, worsening urinary symptoms, gross hematuria.  Continue testosterone at 100 mg every 14 days, today is 9 days after his last injection so level may be slightly lower, he feels good and would like to continue this dose Continue Flomax 0.8 mg nightly RTC 1 year PVR    Sondra Come, MD  [facility] [address], [address] [place], [place] [address] [phone]

## 2023-06-13 DIAGNOSIS — Z95818 Presence of other cardiac implants and grafts: Secondary | ICD-10-CM

## 2023-06-13 HISTORY — DX: Presence of other cardiac implants and grafts: Z95.818

## 2023-06-15 LAB — TESTOSTERONE

## 2023-06-17 ENCOUNTER — Ambulatory Visit: Payer: Medicare PPO | Attending: Medical | Admitting: Medical

## 2023-06-17 ENCOUNTER — Encounter: Payer: Self-pay | Admitting: Medical

## 2023-06-17 VITALS — BP 122/61 | HR 80 | Ht 69.0 in | Wt 178.4 lb

## 2023-06-17 DIAGNOSIS — I4891 Unspecified atrial fibrillation: Secondary | ICD-10-CM

## 2023-06-17 DIAGNOSIS — I5020 Unspecified systolic (congestive) heart failure: Secondary | ICD-10-CM

## 2023-06-17 DIAGNOSIS — I447 Left bundle-branch block, unspecified: Secondary | ICD-10-CM | POA: Diagnosis not present

## 2023-06-17 MED ORDER — DAPAGLIFLOZIN PROPANEDIOL 10 MG PO TABS: 10.0000 mg | ORAL_TABLET | Freq: Every day | ORAL | 0 refills | Status: DC

## 2023-06-17 NOTE — Patient Instructions (Signed)
 Medication Instructions:  Your Physician recommend you continue on your current medication as directed.    *If you need a refill on your cardiac medications before your next appointment, please call your pharmacy*   Lab Work: None ordered If you have labs (blood work) drawn today and your tests are completely normal, you will receive your results only by: MyChart Message (if you have MyChart) OR A paper copy in the mail If you have any lab test that is abnormal or we need to change your treatment, we will call you to review the results.   Testing/Procedures: None ordered   Follow-Up: At [facility], you and your health needs are our priority.  As part of our continuing mission to provide you with exceptional heart care, we have created designated Provider Care Teams.  These Care Teams include your primary Cardiologist (physician) and Advanced Practice Providers (APPs -  Physician Assistants and Nurse Practitioners) who all work together to provide you with the care you need, when you need it.  We recommend signing up for the patient portal called MyChart.  Sign up information is provided on this After Visit Summary.  MyChart is used to connect with patients for Virtual Visits (Telemedicine).  Patients are able to view lab/test results, encounter notes, upcoming appointments, etc.  Non-urgent messages can be sent to your provider as well.   To learn more about what you can do with MyChart, go to [email or web address].    Your next appointment:   2 month(s)  Provider:   You may see Redell Cave, MD or one of the following Advanced Practice Providers on your designated Care Team:   Lonni Meager, NP Bernardino Bring, PA-C Cadence Franchester, PA-C Tylene Lunch, NP Barnie Hila, NP

## 2023-06-17 NOTE — Progress Notes (Signed)
 Cardiology Office Note:  .   Date:  06/17/2023  ID:  Helayne Jama Cocker, DOB 11-May-1946, MRN [id number] PCP: Glendia Shad, MD  [facility] Providers Cardiologist:  Redell Cave, MD Electrophysiologist:  Fonda Kitty, MD {   History of Present Illness: .   Ricardo Nash is a 78 y.o. male  HTN, Afib, HFmrEF, NICM, non-obstructive CAD, diabetes who presents for follow-up.    Exercise MPI in 12/2017 showed no evidence of ischemia. Echo in 2023 showed LVEF 40-45% (MD felt 50%), septal bounce consistent with BBB.    He was seen 03/2023 for afib, unsure if it was lone or paroxysmal. Heart monitor was placed and he was referred to the Afib clinic. Echo was ordered.  Heart monitor showed predominantly normal sinus rhythm, first-degree AV block, bundle branch block/IVCD present, 3 runs of nonsustained VT, paroxysmal SVT, no A-fib or flutter.  Ventricular bigeminy and trigeminy were present.  Echocardiogram showed EF of 35 to 40%, moderate LVH, grade 1 diastolic dysfunction, mild MR. Left heart cath 06/02/2023 showed moderate two-vessel CAD with 50 to 60% mid LAD stenosis and 40 to 50% proximal to mid dominant left circumflex, normal LVEDP.  Patient is following with EP, plan for loop recorder to evaluate for A-fib.  Patient was last seen by advanced heart failure on 06/05/2023.  Amlodipine  was changed to losartan .  Today, the patient is overall doing well. He reports he is unable to afford Farxiga , even with insurance. He denies chest pain, SOB, lower leg edema. Lipitor was increased from 20 to 40mg  daily. Cath site, right radial, is stable.   Studies Reviewed: SABRA   EKG Interpretation Date/Time:  Wednesday June 17 2023 10:38:05 EST Ventricular Rate:  80 PR Interval:  142 QRS Duration:  136 QT Interval:  414 QTC Calculation: 477 R Axis:   -16  Text Interpretation: Normal sinus rhythm Left bundle branch block When compared with ECG of 26-May-2023 15:46, No significant change  was found Confirmed by Franchester, Lexani Corona ([id number]) on 06/17/2023 10:50:37 AM    LHC 05/2023 Conclusions: Moderate two-vessel coronary artery disease, as detailed below, with 50-60% mid LAD stenosis and 40-50% proximal/mid dominant LCx lesion. Normal left ventricular filling pressure (LVEDP 14 mmHg).   Recommendations: Continue escalation of goal-directed medical therapy in the setting of nonischemic cardiomyopathy. Continue medical therapy and risk factor modification to prevent progression of coronary artery disease.  If the patient were to develop angina, functional study could be considered to assess the functional significance of LAD and LCx disease.   Lonni Hanson, MD [facility]    Echo 05/2023 1. Left ventricular ejection fraction, by estimation, is 35 to 40%. Left  ventricular ejection fraction by PLAX is 40 %. The left ventricle has  moderately decreased function. The left ventricle demonstrates global  hypokinesis. There is moderate  concentric left ventricular hypertrophy. Left ventricular diastolic  parameters are consistent with Grade I diastolic dysfunction (impaired  relaxation).   2. Right ventricular systolic function is normal. The right ventricular  size is normal.   3. The mitral valve is normal in structure. Mild mitral valve  regurgitation. No evidence of mitral stenosis.   4. The aortic valve is tricuspid. Aortic valve regurgitation is not  visualized. Aortic valve sclerosis is present, with no evidence of aortic  valve stenosis. Aortic valve mean gradient measures 3.0 mmHg.   5. The inferior vena cava is normal in size with greater than 50%  respiratory variability, suggesting right atrial pressure of 3 mmHg.  Heart monitor 04/2023 Patch Wear Time:  13 days and 23 hours (2024-11-22T10:52:08-498 to 2024-12-06T10:52:00-0500)   Patient had a min HR of 60 bpm, max HR of 176 bpm, and avg HR of 78 bpm. Predominant underlying rhythm was Sinus Rhythm. First Degree  AV Block was present. Bundle Branch Block/IVCD was present.3 Ventricular Tachycardia runs occurred, the run with the  fastest interval lasting 13.3 secs with a max rate of 176 bpm (avg 132 bpm); the run with the fastest interval was also the longest. 4 Supraventricular Tachycardia runs occurred, the run with the fastest interval lasting 16 beats with a max rate of 126  bpm (avg 108 bpm); the run with the fastest interval was also the longest. Isolated SVEs were rare (<1.0%), SVE Couplets were rare (<1.0%), and no SVE Triplets were present. Isolated VEs were rare (<1.0%), VE Couplets were rare (<1.0%), and no VE  Triplets were present. Ventricular Bigeminy and Trigeminy were present.   Conclusion Paroxysmal SVT noted, 3 episodes of nonsustained VT noted. No atrial fibrillation or atrial flutter.   Echo 06/2021 1. Left ventricular ejection fraction, by estimation, is 40 to 45%. The  left ventricle has mildly decreased function. The left ventricle  demonstrates global hypokinesis. The left ventricular internal cavity size  was mildly to moderately dilated. Left  ventricular diastolic parameters were normal.   2. Right ventricular systolic function is low normal. The right  ventricular size is mildly enlarged. Mildly increased right ventricular  wall thickness.   3. The mitral valve is grossly normal. Trivial mitral valve  regurgitation.   4. The aortic valve is grossly normal. Aortic valve regurgitation is not  visualized.       Physical Exam:   VS:  BP 122/61 (BP Location: Left Arm, Patient Position: Sitting, Cuff Size: Normal)   Pulse 80   Ht 5' 9 (1.753 m)   Wt 178 lb 6.4 oz (80.9 kg)   SpO2 97%   BMI 26.35 kg/m    Wt Readings from Last 3 Encounters:  06/17/23 178 lb 6.4 oz (80.9 kg)  06/09/23 175 lb (79.4 kg)  06/05/23 180 lb (81.6 kg)    GEN: Well nourished, well developed in no acute distress NECK: No JVD; No carotid bruits CARDIAC: RRR, no murmurs, rubs,  gallops RESPIRATORY:  Clear to auscultation without rales, wheezing or rhonchi  ABDOMEN: Soft, non-tender, non-distended EXTREMITIES:  No edema; No deformity   ASSESSMENT AND PLAN: .    NICM HFmrEF Echo showed LVEF 35-40%. LHC showed moderate 2 V CAD, normal LVEDP. CM may be from LBBB. The patient is taking coreg  25mg  BID, Losartan  50mg  daily, Farxiga  10mg  daily, spironolactone  25mg  daily. He says Farxiga  is very expensive, even with insurance. We will give samples and look into patient assistance.Weary of changing Losartan  to Entresto  due to these reasons. Patient is euvolemic on exam. Patient is following with Advanced heart failure.   Non-obstructive CAD LHC showed 50-60% mid LAD stenosis and 40-50% proximal to mid dominant Lcx lesion. Patient denies anginal symptoms. He walks three times a week. Continue Aspirin  81mg  daily, coreg  25mg  BID, Lipitor 40mg  daily.   Afib Long history of afib, however unclear if it was lone afib vs paroxysmal Afib. Patient saw EP, who is planning on Loop recorder. He is on a/c with ASA.   HTN BP is good today. Continue current medications.   HLD LDL 49, TG 185, HDL 37, total chol 37. Lipitor increased to 40mg . Can re-check lipids at follow-up.  Dispo: Follow-up in 3 months  Signed, Lorann Tani VEAR Fishman, PA-C

## 2023-06-19 ENCOUNTER — Other Ambulatory Visit: Payer: Self-pay | Admitting: Internal Medicine

## 2023-06-22 ENCOUNTER — Encounter: Payer: Self-pay | Admitting: Internal Medicine

## 2023-06-22 ENCOUNTER — Institutional Professional Consult (permissible substitution): Payer: Medicare PPO | Admitting: Internal Medicine

## 2023-06-22 ENCOUNTER — Ambulatory Visit (INDEPENDENT_AMBULATORY_CARE_PROVIDER_SITE_OTHER): Payer: Medicare PPO | Admitting: Internal Medicine

## 2023-06-22 VITALS — BP 134/60 | HR 80 | Temp 97.7°F | Wt 181.0 lb

## 2023-06-22 DIAGNOSIS — G4733 Obstructive sleep apnea (adult) (pediatric): Secondary | ICD-10-CM | POA: Diagnosis not present

## 2023-06-22 NOTE — Patient Instructions (Signed)
 Recommend home sleep test to assess for sleep apnea  Congratulations on weight loss!!  Avoid Allergens and Irritants Avoid secondhand smoke Avoid SICK contacts Recommend  Masking  when appropriate Recommend Keep up-to-date with vaccinations   Be aware of reduced alertness and do not drive or operate heavy machinery if experiencing this or drowsiness.  Exercise encouraged, as tolerated. Encouraged proper weight management.  Important to get eight or more hours of sleep  Limiting the use of the computer and television before bedtime.  Decrease naps during the day, so night time sleep will become enhanced.  Limit caffeine, and sleep deprivation.

## 2023-06-22 NOTE — Progress Notes (Signed)
 Name: Ricardo Nash MRN: [id number] DOB: 1946-04-15    CHIEF COMPLAINT:  EXCESSIVE DAYTIME SLEEPINESS Assessment of OSA   HISTORY OF PRESENT ILLNESS: Patient has been having excessive daytime sleepiness for a long time Patient has been having extreme fatigue and tiredness, lack of energy +  very Loud snoring every night + struggling breathe at night and gasps for air + morning headaches + Nonrefreshing sleep  Patient diagnosed with sleep apnea 15 years ago Has been on CPAP 12 cm water pressure for the last 15 years Patient has been very compliant based on his CPAP download for the last several months his AHI reduced to 4.3 average usage about 3 hours Patient has lost significant amount of weight over the last several years approximately 50 to 75 pounds Mostly its intentional weight loss however he had history of double pneumonia as well as gallbladder disease    Discussed sleep data and reviewed with patient.  Encouraged proper weight management.  Discussed driving precautions and its relationship with hypersomnolence.  Discussed operating dangerous equipment and its relationship with hypersomnolence.  Discussed sleep hygiene, and benefits of a fixed sleep waked time.  The importance of getting eight or more hours of sleep discussed with patient.  Discussed limiting the use of the computer and television before bedtime.  Decrease naps during the day, so night time sleep will become enhanced.  Limit caffeine, and sleep deprivation.  HTN, stroke, and heart failure are potential risk factors.     No evidence of heart failure at this time No evidence or signs of infection at this time No respiratory distress No fevers, chills, nausea, vomiting, diarrhea No evidence of lower extremity edema No evidence hemoptysis     PAST MEDICAL HISTORY :   has a past medical history of Atrial fibrillation (HCC), C. difficile colitis, CAD (coronary artery disease), Chronic heart  failure with mildly reduced ejection fraction (HFmrEF, 41-49%) (HCC), Coronary artery disease, Depression, Diabetes mellitus (HCC), Diverticulitis, Diverticulosis, Dysrhythmia, Environmental allergies, GERD (gastroesophageal reflux disease), Goiter, Hypercholesterolemia, Hypertension, NICM (nonischemic cardiomyopathy) (HCC), Osteoarthritis, Paroxysmal atrial fibrillation (HCC), Pneumonia, PONV (postoperative nausea and vomiting), Sleep apnea, Stroke (HCC), and TIA (transient ischemic attack).  has a past surgical history that includes Inguinal hernia repair (1991); Septoplasty (1975); Shoulder surgery (2000); Cervical disc surgery (2002); Back surgery (12/2006); Nose surgery; Cataract extraction (2011); eyelid reduction; Eye surgery; capsule endoscopy; Colonoscopy with propofol  (N/A, 07/20/2017); Lumbar laminectomy/decompression microdiscectomy (N/A, 10/15/2020); Back surgery (2024); Cholecystectomy (2024); and LEFT HEART CATH AND CORONARY ANGIOGRAPHY (Left, 06/02/2023). Prior to Admission medications   Medication Sig Start Date End Date Taking? Authorizing Provider  ACCU-CHEK GUIDE TEST test strip USE AS DIRECTED TO TEST BLOOD GLUCOSE LEVELS TWICE DAILY 06/03/23   Dellar Fenton, MD  ALPRAZolam  (XANAX ) 0.25 MG tablet Take 1 tablet (0.25 mg total) by mouth daily as needed. 06/04/23   Dellar Fenton, MD  aspirin  EC 81 MG tablet Take 81 mg by mouth every evening. Swallow whole.    [provider]  atorvastatin  (LIPITOR) 40 MG tablet TAKE 1 TABLET BY MOUTH ONCE EVERY EVENING 06/05/23   Constancia Delton, MD  azelastine  (OPTIVAR ) 0.05 % ophthalmic solution Apply to eye. 03/23/23   [provider]  Azelastine -Fluticasone  137-50 MCG/ACT SUSP PLACE 1 SPRAY INTO EACH NOSTRIL ONCE EVERY MORNING AND AT BEDTIME 12/15/22   Dellar Fenton, MD  blood glucose meter kit and supplies Dispense based on patient and insurance preference. Use up to four times daily as directed. (FOR ICD-10 E10.9, E11.9). 11/11/19  Dellar Fenton, MD  BOOSTRIX 5-2.5-18.5 LF-MCG/0.5 injection Inject 0.5 mLs into the muscle once. 02/26/23   [provider]  carvedilol  (COREG ) 25 MG tablet TAKE 1 TABLET BY MOUTH TWICE DAILY WITH MEALS 06/08/23   Dellar Fenton, MD  dapagliflozin  propanediol (FARXIGA ) 10 MG TABS tablet Take 1 tablet (10 mg total) by mouth daily before breakfast. 06/17/23   Furth, Cadence H, PA-C  doxycycline  (VIBRA -TABS) 100 MG tablet Take 1 tablet (100 mg total) by mouth 2 (two) times daily. Patient not taking: Reported on 06/17/2023 06/04/23   Dellar Fenton, MD  fluorometholone (FML) 0.1 % ophthalmic suspension SMARTSIG:In Eye(s) 03/25/23   [provider]  levocetirizine (XYZAL ) 5 MG tablet TAKE 1 TABLET BY MOUTH ONCE EVERY EVENING 06/08/23   Dellar Fenton, MD  losartan  (COZAAR ) 50 MG tablet Take 1 tablet (50 mg total) by mouth daily. 06/05/23   Lauralee Poll, MD  metFORMIN  (GLUCOPHAGE ) 1000 MG tablet TAKE 1 TABLET BY MOUTH TWICE DAILY WITH MEALS 06/19/23   Dellar Fenton, MD  montelukast  (SINGULAIR ) 10 MG tablet Take 1 tablet (10 mg total) by mouth at bedtime. 06/04/23   Dellar Fenton, MD  Multiple Vitamin (MULTIVITAMIN WITH MINERALS) TABS tablet Take 1 tablet by mouth in the morning.    [provider]  ondansetron  (ZOFRAN ) 4 MG tablet Take 1 tablet (4 mg total) by mouth every 8 (eight) hours as needed for nausea or vomiting. 01/07/23   Dellar Fenton, MD  pantoprazole  (PROTONIX ) 40 MG tablet TAKE 1 TABLET BY MOUTH ONCE DAILY 03/04/23   Dellar Fenton, MD  pioglitazone  (ACTOS ) 15 MG tablet TAKE 1 TABLET BY MOUTH ONCE DAILY 05/20/23   Dellar Fenton, MD  sertraline  (ZOLOFT ) 100 MG tablet TAKE 1 TABLET BY MOUTH ONCE DAILY 05/20/23   Dellar Fenton, MD  spironolactone  (ALDACTONE ) 25 MG tablet Take 1 tablet (25 mg total) by mouth daily. 05/22/23 08/20/23  Constancia Delton, MD  tamsulosin  (FLOMAX ) 0.4 MG CAPS capsule Take 2 capsules (0.8 mg total) by mouth daily after supper.  03/04/23   Lawerence Pressman, MD  testosterone  cypionate (DEPOTESTOSTERONE CYPIONATE) 200 MG/ML injection INJECT 0.5MLS INTRAMUSCULARLY EVERY 14 DAYS 05/12/23   Lawerence Pressman, MD  traZODone  (DESYREL ) 50 MG tablet Take 1 tablet (50 mg total) by mouth at bedtime as needed for sleep. 03/16/23   Dellar Fenton, MD  TRULICITY  3 MG/0.5ML [place] INJECT 3MG  SUBCUTANEOUSLY ONCE A WEEK 06/03/23   Dellar Fenton, MD   Allergies  Allergen Reactions   Penicillins Hives    Childhood allergy (age 74)   Penicillin G     Other Reaction(s): Weal (disorder)    FAMILY HISTORY:  family history includes Congestive Heart Failure in his father; Heart disease in his father and mother; Kidney disease in his sister; Rheumatic fever in his father. SOCIAL HISTORY:  reports that he has quit smoking. He has never been exposed to tobacco smoke. He has never used smokeless tobacco. He reports current alcohol use. He reports that he does not use drugs.   Review of Systems:  Gen:  Denies  fever, sweats, chills weight loss  HEENT: Denies blurred vision, double vision, ear pain, eye pain, hearing loss, nose bleeds, sore throat Cardiac:  No dizziness, chest pain or heaviness, chest tightness,edema, No JVD Resp:   No cough, -sputum production, -shortness of breath,-wheezing, -hemoptysis,  Gi: Denies swallowing difficulty, stomach pain, nausea or vomiting, diarrhea, constipation, bowel incontinence Gu:  Denies bladder incontinence, burning urine Ext:   Denies Joint pain, stiffness or swelling  Skin: Denies  skin rash, easy bruising or bleeding or hives Endoc:  Denies polyuria, polydipsia , polyphagia or weight change Psych:   Denies depression, insomnia or hallucinations  Other:  All other systems negative   ALL OTHER ROS ARE NEGATIVE   BP 134/60 (BP Location: Left Arm, Patient Position: Sitting, Cuff Size: Normal)   Pulse 80   Temp 97.7 F (36.5 C) (Temporal)   Wt 181 lb (82.1 kg)   SpO2 97%   BMI 26.73 kg/m      Physical Examination:   General Appearance: No distress  EYES PERRLA, EOM intact.   NECK Supple, No JVD ORAL CAVITY MALLAMPATI 4 Pulmonary: normal breath sounds, No wheezing.  CardiovascularNormal S1,S2.  No m/r/g.   Abdomen: Benign, Soft, non-tender. Skin:   warm, no rashes, no ecchymosis  Extremities: normal, no cyanosis, clubbing. Neuro:without focal findings,  speech normal  PSYCHIATRIC: Mood, affect within normal limits.   ALL OTHER ROS ARE NEGATIVE    ASSESSMENT AND PLAN SYNOPSIS  78 year old pleasant white male seen today for assessment of OSA At this time due to his significant weight loss will reassess underlying OSA with a home sleep test  Assessment of OSA Recommend home sleep study for reassessment In the meantime continue CPAP as prescribed Be aware of reduced alertness and do not drive or operate heavy machinery if experiencing this or drowsiness.  Exercise encouraged, as tolerated. Encouraged proper weight management.  Important to get eight or more hours of sleep  Limiting the use of the computer and television before bedtime.  Decrease naps during the day, so night time sleep will become enhanced.  Limit caffeine, and sleep deprivation.  Deconditioned state -Recommend increased daily activity and exercise   MEDICATION ADJUSTMENTS/LABS AND TESTS ORDERED:  Recommend home sleep test to assess for sleep apnea Avoid Allergens and Irritants Avoid secondhand smoke Avoid SICK contacts Recommend  Masking  when appropriate Recommend Keep up-to-date with vaccinations      CURRENT MEDICATIONS REVIEWED AT LENGTH WITH PATIENT TODAY   Patient  satisfied with Plan of action and management. All questions answered  Follow up  6 weeks   I spent a total of  62 minutes reviewing chart data, face-to-face evaluation with the patient, counseling and coordination of care as detailed above.    Lady Pier, M.D.  [facility]  Medical Director [facility] Medical Director [facility] Cardio-Pulmonary Department

## 2023-06-26 ENCOUNTER — Telehealth: Payer: Self-pay | Admitting: *Deleted

## 2023-06-26 NOTE — Telephone Encounter (Signed)
Application started and will verify his mailing address and answer some of the other questions. Once that is done we can process his application at that time.

## 2023-06-26 NOTE — Telephone Encounter (Signed)
-----   Message from Nurse Tarri Abernethy sent at 06/17/2023 11:35 AM EST ----- Can you see if this patient qualifies for assistance for Farxiga?  Thank you, Elon Jester, RN

## 2023-06-29 ENCOUNTER — Encounter

## 2023-06-29 DIAGNOSIS — G4733 Obstructive sleep apnea (adult) (pediatric): Secondary | ICD-10-CM

## 2023-06-29 DIAGNOSIS — G4730 Sleep apnea, unspecified: Secondary | ICD-10-CM | POA: Diagnosis not present

## 2023-07-01 ENCOUNTER — Ambulatory Visit (INDEPENDENT_AMBULATORY_CARE_PROVIDER_SITE_OTHER): Payer: Medicare PPO | Admitting: *Deleted

## 2023-07-01 VITALS — Ht 69.0 in | Wt 175.0 lb

## 2023-07-01 DIAGNOSIS — Z0000 Encounter for general adult medical examination without abnormal findings: Secondary | ICD-10-CM | POA: Diagnosis not present

## 2023-07-01 DIAGNOSIS — C44519 Basal cell carcinoma of skin of other part of trunk: Secondary | ICD-10-CM | POA: Diagnosis not present

## 2023-07-01 NOTE — Progress Notes (Signed)
 Subjective:   Ricardo Nash is a 78 y.o. male who presents for Medicare Annual/Subsequent preventive examination.  Visit Complete: Virtual I connected with  Ricardo Nash on 07/01/23 by a audio enabled telemedicine application and verified that I am speaking with the correct person using two identifiers. This patient declined Interactive audio and Acupuncturist. Therefore the visit was completed with audio only.   Patient Location: Home  Provider Location: Home Office  I discussed the limitations of evaluation and management by telemedicine. The patient expressed understanding and agreed to proceed.  Vital Signs: Because this visit was a virtual/telehealth visit, some criteria may be missing or patient reported. Any vitals not documented were not able to be obtained and vitals that have been documented are patient reported.   Cardiac Risk Factors include: advanced age (>64men, >31 women);male gender;dyslipidemia;diabetes mellitus;hypertension     Objective:    Today's Vitals   07/01/23 0935  Weight: 175 lb (79.4 kg)  Height: 5\' 9"  (1.753 m)   Body mass index is 25.84 kg/m.     07/01/2023   10:00 AM 06/30/2022    1:51 PM 07/06/2021    7:30 PM 07/02/2021    7:56 PM 07/02/2021   12:00 AM 06/29/2021    9:51 PM 06/21/2021   10:43 AM  Advanced Directives  Does Patient Have a Medical Advance Directive? Yes Yes Yes Yes Yes No;Yes Yes  Type of Estate agent of [place];Living will Healthcare Power of [place];Living will Healthcare Power of [employer] Power of Attorney Out of facility DNR (pink MOST or yellow form);Living will;Healthcare Power of [employer] of [place];Living will  Does patient want to make changes to medical advance directive? No - Patient declined No - Patient declined No - Patient declined No - Patient declined No - Patient declined  No - Patient declined  Copy of Healthcare Power of Attorney in Chart?  Yes - validated most recent copy scanned in chart (See row information) Yes - validated most recent copy scanned in chart (See row information) Yes - validated most recent copy scanned in chart (See row information)  Yes - validated most recent copy scanned in chart (See row information) Yes - validated most recent copy scanned in chart (See row information) Yes - validated most recent copy scanned in chart (See row information)    Current Medications (verified) Outpatient Encounter Medications as of 07/01/2023  Medication Sig   ACCU-CHEK GUIDE TEST test strip USE AS DIRECTED TO TEST BLOOD GLUCOSE LEVELS TWICE DAILY   ALPRAZolam (XANAX) 0.25 MG tablet Take 1 tablet (0.25 mg total) by mouth daily as needed.   aspirin EC 81 MG tablet Take 81 mg by mouth every evening. Swallow whole.   atorvastatin (LIPITOR) 40 MG tablet TAKE 1 TABLET BY MOUTH ONCE EVERY EVENING   azelastine (OPTIVAR) 0.05 % ophthalmic solution Apply to eye.   Azelastine-Fluticasone 137-50 MCG/ACT SUSP PLACE 1 SPRAY INTO EACH NOSTRIL ONCE EVERY MORNING AND AT BEDTIME   blood glucose meter kit and supplies Dispense based on patient and insurance preference. Use up to four times daily as directed. (FOR ICD-10 E10.9, E11.9).   carvedilol (COREG) 25 MG tablet TAKE 1 TABLET BY MOUTH TWICE DAILY WITH MEALS   dapagliflozin propanediol (FARXIGA) 10 MG TABS tablet Take 1 tablet (10 mg total) by mouth daily before breakfast.   fluorometholone (FML) 0.1 % ophthalmic suspension SMARTSIG:In Eye(s)   levocetirizine (XYZAL) 5 MG tablet TAKE 1 TABLET BY MOUTH ONCE EVERY EVENING  losartan (COZAAR) 50 MG tablet Take 1 tablet (50 mg total) by mouth daily.   metFORMIN (GLUCOPHAGE) 1000 MG tablet TAKE 1 TABLET BY MOUTH TWICE DAILY WITH MEALS   montelukast (SINGULAIR) 10 MG tablet Take 1 tablet (10 mg total) by mouth at bedtime.   Multiple Vitamin (MULTIVITAMIN WITH MINERALS) TABS tablet Take 1 tablet by mouth in the morning.   ondansetron (ZOFRAN) 4 MG  tablet Take 1 tablet (4 mg total) by mouth every 8 (eight) hours as needed for nausea or vomiting.   pantoprazole (PROTONIX) 40 MG tablet TAKE 1 TABLET BY MOUTH ONCE DAILY   pioglitazone (ACTOS) 15 MG tablet TAKE 1 TABLET BY MOUTH ONCE DAILY   sertraline (ZOLOFT) 100 MG tablet TAKE 1 TABLET BY MOUTH ONCE DAILY   spironolactone (ALDACTONE) 25 MG tablet Take 1 tablet (25 mg total) by mouth daily.   tamsulosin (FLOMAX) 0.4 MG CAPS capsule Take 2 capsules (0.8 mg total) by mouth daily after supper.   testosterone cypionate (DEPOTESTOSTERONE CYPIONATE) 200 MG/ML injection INJECT 0.5MLS INTRAMUSCULARLY EVERY 14 DAYS   traZODone (DESYREL) 50 MG tablet Take 1 tablet (50 mg total) by mouth at bedtime as needed for sleep.   TRULICITY 3 MG/0.5ML SOAJ INJECT 3MG  SUBCUTANEOUSLY ONCE A WEEK   BOOSTRIX 5-2.5-18.5 LF-MCG/0.5 injection Inject 0.5 mLs into the muscle once. (Patient not taking: Reported on 07/01/2023)   [DISCONTINUED] doxycycline (VIBRA-TABS) 100 MG tablet Take 1 tablet (100 mg total) by mouth 2 (two) times daily. (Patient not taking: Reported on 07/01/2023)   No facility-administered encounter medications on file as of 07/01/2023.    Allergies (verified) Penicillins and Penicillin g   History: Past Medical History:  Diagnosis Date   Atrial fibrillation (HCC)    C. difficile colitis    CAD (coronary artery disease)    Chronic heart failure with mildly reduced ejection fraction (HFmrEF, 41-49%) (HCC)    Coronary artery disease    Depression    Diabetes mellitus (HCC)    Diverticulitis    Diverticulosis    Dysrhythmia    PAF   Environmental allergies    GERD (gastroesophageal reflux disease)    Goiter    intrathoracic, s/p benign biopsy (Dr Gerrit Friends)   Hypercholesterolemia    Hypertension    NICM (nonischemic cardiomyopathy) (HCC)    Osteoarthritis    cervical spine, lumbar spine   Paroxysmal atrial fibrillation (HCC)    Pneumonia    PONV (postoperative nausea and vomiting)     Sleep apnea    CPAP @ NIGHT   Stroke [facility])    TIA (transient ischemic attack)    Past Surgical History:  Procedure Laterality Date   BACK SURGERY  12/2006   s/p fusion of L4-L5   BACK SURGERY  2024   capsule endoscopy     CATARACT EXTRACTION  2011   Dr. Elmer Picker   CERVICAL DISC SURGERY  2002   CHOLECYSTECTOMY  2024   COLONOSCOPY WITH PROPOFOL N/A 07/20/2017   Procedure: COLONOSCOPY WITH PROPOFOL;  Surgeon: Scot Jun, MD;  Location: [facility] ENDOSCOPY;  Service: Endoscopy;  Laterality: N/A;   EYE SURGERY     cataract bilateral 10/1999   eyelid reduction     Dr. Chestine Spore   gallbladder removed  12/2022   INGUINAL HERNIA REPAIR  1991   Dr, Katrinka Blazing   LEFT HEART CATH AND CORONARY ANGIOGRAPHY Left 06/02/2023   Procedure: LEFT HEART CATH AND CORONARY ANGIOGRAPHY;  Surgeon: Yvonne Kendall, MD;  Location: [facility] INVASIVE CV LAB;  Service: Cardiovascular;  Laterality: Left;   LUMBAR LAMINECTOMY/DECOMPRESSION MICRODISCECTOMY N/A 10/15/2020   Procedure: Laminectomy - Lumbar Two-Lumbar Three - Lumbar Three-Lumbar Four with sublaminar decompression;  Surgeon: Tia Alert, MD;  Location: [facility] OR;  Service: Neurosurgery;  Laterality: N/A;  Laminectomy - Lumbar Two-Lumbar Three - Lumbar Three-Lumbar Four with sublaminar decompression   NOSE SURGERY     turbinate reduction   SEPTOPLASTY  1975   SHOULDER SURGERY  2000   rotator cuff   Family History  Problem Relation Age of Onset   Congestive Heart Failure Father    Heart disease Father        myocardial infarction   Rheumatic fever Father        valvular disease   Heart disease Mother        s/p CABG (age 13)   Kidney disease Sister    Colon cancer Neg Hx    Prostate cancer Neg Hx    Social History   Socioeconomic History   Marital status: Married    Spouse name: Not on file   Number of children: 2   Years of education: Not on file   Highest education level: Not on file  Occupational History   Occupation: retired Runner, broadcasting/film/video  Tobacco  Use   Smoking status: Former    Passive exposure: Never   Smokeless tobacco: Never  Vaping Use   Vaping status: Never Used  Substance and Sexual Activity   Alcohol use: Yes    Alcohol/week: 0.0 standard drinks of alcohol    Comment: occasional   Drug use: No   Sexual activity: Not Currently  Other Topics Concern   Not on file  Social History Narrative   Not on file   Social Drivers of Health   Financial Resource Strain: Low Risk  (07/01/2023)   Overall Financial Resource Strain (CARDIA)    Difficulty of Paying Living Expenses: Not hard at all  Food Insecurity: No Food Insecurity (07/01/2023)   Hunger Vital Sign    Worried About Running Out of Food in the Last Year: Never true    Ran Out of Food in the Last Year: Never true  Transportation Needs: No Transportation Needs (07/01/2023)   PRAPARE - Administrator, Civil Service (Medical): No    Lack of Transportation (Non-Medical): No  Physical Activity: Inactive (07/01/2023)   Exercise Vital Sign    Days of Exercise per Week: 0 days    Minutes of Exercise per Session: 0 min  Stress: No Stress Concern Present (07/01/2023)   [employer] of Occupational Health - Occupational Stress Questionnaire    Feeling of Stress : Only a little  Social Connections: Socially Integrated (07/01/2023)   Social Connection and Isolation Panel [NHANES]    Frequency of Communication with Friends and Family: More than three times a week    Frequency of Social Gatherings with Friends and Family: Three times a week    Attends Religious Services: More than 4 times per year    Active Member of Clubs or Organizations: Yes    Attends Engineer, structural: More than 4 times per year    Marital Status: Married    Tobacco Counseling Counseling given: Not Answered   Clinical Intake:  Pre-visit preparation completed: Yes  Pain : No/denies pain     BMI - recorded: 25.84 Nutritional Status: BMI 25 -29 Overweight Nutritional  Risks: None Diabetes: Yes CBG done?: Yes (FBS 124 per patient) CBG resulted in Enter/ Edit results?: No Did pt. bring in  CBG monitor from home?: No  How often do you need to have someone help you when you read instructions, pamphlets, or other written materials from your doctor or pharmacy?: 1 - Never  Interpreter Needed?: No  Information entered by :: R. Sylvana Bonk LPN   Activities of Daily Living    07/01/2023    9:37 AM 06/02/2023    9:49 AM  In your present state of health, do you have any difficulty performing the following activities:  Hearing? 1 1  Comment wears aids   Vision? 0 0  Difficulty concentrating or making decisions? 0 0  Walking or climbing stairs? 1   Dressing or bathing? 0   Doing errands, shopping? 0   Preparing Food and eating ? N   Using the Toilet? N   In the past six months, have you accidently leaked urine? Y   Do you have problems with loss of bowel control? N   Managing your Medications? N   Managing your Finances? N   Housekeeping or managing your Housekeeping? N     Patient Care Team: Dale [place], MD as PCP - General (Internal Medicine) Debbe Odea, MD as PCP - Cardiology (Cardiology) Nobie Putnam, MD as PCP - Electrophysiology (Cardiology)  Indicate any recent Medical Services you may have received from other than [facility] providers in the past year (date may be approximate).     Assessment:   This is a routine wellness examination for [place].  Hearing/Vision screen Hearing Screening - Comments:: Wears aids Vision Screening - Comments:: glasses   Goals Addressed             This Visit's Progress    Patient Stated       Wants to start back exercising       Depression Screen    07/01/2023    9:55 AM 06/04/2023    7:41 AM 06/30/2022    1:50 PM 02/10/2022   10:11 AM 10/08/2021   10:13 AM 06/21/2021   10:42 AM 06/20/2020   10:46 AM  PHQ 2/9 Scores  PHQ - 2 Score 0 1 0 2 0 0 0  PHQ- 9 Score 1 3  10        Fall Risk     07/01/2023    9:41 AM 06/04/2023    7:41 AM 06/30/2022    1:52 PM 02/10/2022   10:13 AM 02/10/2022   10:06 AM  Fall Risk   Falls in the past year? 0 0 0 1 1  Number falls in past yr: 0 0 0 1 1  Injury with Fall? 0 0 0 0 1  Risk for fall due to : No Fall Risks No Fall Risks  Impaired balance/gait Impaired balance/gait  Follow up Falls prevention discussed;Falls evaluation completed  Falls evaluation completed;Falls prevention discussed Falls evaluation completed Falls evaluation completed    MEDICARE RISK AT HOME: Medicare Risk at Home Any stairs in or around the home?: Yes If so, are there any without handrails?: No Home free of loose throw rugs in walkways, pet beds, electrical cords, etc?: Yes Adequate lighting in your home to reduce risk of falls?: Yes Life alert?: No Use of a cane, walker or w/c?: No Grab bars in the bathroom?: Yes Shower chair or bench in shower?: Yes Elevated toilet seat or a handicapped toilet?: Yes      Cognitive Function:    06/05/2016    9:28 AM  MMSE - Mini Mental State Exam  Orientation to time 5  Orientation to  Place 5  Registration 3  Attention/ Calculation 5  Recall 3  Language- name 2 objects 2  Language- repeat 1  Language- follow 3 step command 3  Language- read & follow direction 1  Write a sentence 1  Copy design 1  Total score 30        07/01/2023   10:01 AM 06/30/2022    1:47 PM 06/21/2021   10:49 AM 06/20/2019   10:17 AM 06/16/2018    9:57 AM  6CIT Screen  What Year? 0 points 0 points 0 points 0 points 0 points  What month? 0 points 0 points 0 points 0 points 0 points  What time? 0 points 0 points 0 points 0 points 0 points  Count back from 20 0 points 0 points   0 points  Months in reverse 0 points 0 points 0 points 0 points 0 points  Repeat phrase 0 points 0 points   0 points  Total Score 0 points 0 points   0 points    Immunizations Immunization History  Administered Date(s) Administered   Fluad Quad(high Dose 65+)  02/17/2020, 02/07/2021, 02/10/2022   Fluad Trivalent(High Dose 65+) 01/15/2023   Influenza Split 02/16/2012, 02/13/2014   Influenza, High Dose Seasonal PF 02/20/2016, 02/05/2017, 02/09/2018, 02/07/2019   Influenza,inj,Quad PF,6+ Mos 01/14/2013, 03/08/2015   PFIZER(Purple Top)SARS-COV-2 Vaccination 06/20/2019, 07/11/2019, 02/15/2020, 08/23/2020, 02/10/2022   Pfizer Covid-19 Vaccine Bivalent Booster 72yrs & up 02/18/2021   Pfizer(Comirnaty)Fall Seasonal Vaccine 12 years and older 02/26/2023   Pneumococcal Conjugate-13 03/10/2013   Pneumococcal Polysaccharide-23 08/23/2014   Rsv, Bivalent, Protein Subunit Rsvpref,pf Verdis Frederickson) 06/18/2022   Tdap 05/13/2011, 02/26/2023   Zoster Recombinant(Shingrix) 04/24/2017, 07/15/2017   Zoster, Live 12/08/2013    TDAP status: Up to date  Flu Vaccine status: Up to date  Pneumococcal vaccine status: Up to date  Covid-19 vaccine status: Completed vaccines  Qualifies for Shingles Vaccine? Yes   Zostavax completed Yes   Shingrix Completed?: Yes  Screening Tests Health Maintenance  Topic Date Due   COVID-19 Vaccine (8 - 2024-25 season) 04/23/2023   Medicare Annual Wellness (AWV)  07/01/2023   HEMOGLOBIN A1C  11/23/2023   FOOT EXAM  02/27/2024   OPHTHALMOLOGY EXAM  03/31/2024   Diabetic kidney evaluation - eGFR measurement  05/25/2024   Diabetic kidney evaluation - Urine ACR  06/03/2024   DTaP/Tdap/Td (3 - Td or Tdap) 02/25/2033   Pneumonia Vaccine 72+ Years old  Completed   INFLUENZA VACCINE  Completed   Hepatitis C Screening  Completed   Zoster Vaccines- Shingrix  Completed   HPV VACCINES  Aged Out   Colonoscopy  Discontinued    Health Maintenance  Health Maintenance Due  Topic Date Due   COVID-19 Vaccine (8 - 2024-25 season) 04/23/2023   Medicare Annual Wellness (AWV)  07/01/2023    Colorectal cancer screening: Type of screening: Colonoscopy. Completed 07/2017. Repeat every 5 years Schedule 07/27/23 for a colonoscopy  Lung Cancer  Screening: (Low Dose CT Chest recommended if Age 65-80 years, 20 pack-year currently smoking OR have quit w/in 15years.) does not qualify.     Additional Screening:  Hepatitis C Screening: does qualify; Completed 09/2015  Vision Screening: Recommended annual ophthalmology exams for early detection of glaucoma and other disorders of the eye. Is the patient up to date with their annual eye exam?  Yes  Who is the provider or what is the name of the office in which the patient attends annual eye exams? [facility] If pt is not established with  a provider, would they like to be referred to a provider to establish care? No .   Dental Screening: Recommended annual dental exams for proper oral hygiene  Diabetic Foot Exam: Diabetic Foot Exam: Completed 02/2023  Community Resource Referral / Chronic Care Management: CRR required this visit?  No   CCM required this visit?  No     Plan:     I have personally reviewed and noted the following in the patient's chart:   Medical and social history Use of alcohol, tobacco or illicit drugs  Current medications and supplements including opioid prescriptions. Patient is not currently taking opioid prescriptions. Functional ability and status Nutritional status Physical activity Advanced directives List of other physicians Hospitalizations, surgeries, and ER visits in previous 12 months Vitals Screenings to include cognitive, depression, and falls Referrals and appointments  In addition, I have reviewed and discussed with patient certain preventive protocols, quality metrics, and best practice recommendations. A written personalized care plan for preventive services as well as general preventive health recommendations were provided to patient.     Sydell Axon, LPN   2/37/6283   After Visit Summary: (MyChart) Due to this being a telephonic visit, the after visit summary with patients personalized plan was offered to patient via MyChart    Nurse Notes: None

## 2023-07-01 NOTE — Patient Instructions (Signed)
 Mr. Ricardo Nash , Thank you for taking time to come for your Medicare Wellness Visit. I appreciate your ongoing commitment to your health goals. Please review the following plan we discussed and let me know if I can assist you in the future.   Referrals/Orders/Follow-Ups/Clinician Recommendations: None  This is a list of the screening recommended for you and due dates:  Health Maintenance  Topic Date Due   Colon Cancer Screening  07/21/2022   COVID-19 Vaccine (8 - 2024-25 season) 04/23/2023   Hemoglobin A1C  11/23/2023   Complete foot exam   02/27/2024   Eye exam for diabetics  03/31/2024   Yearly kidney function blood test for diabetes  05/25/2024   Yearly kidney health urinalysis for diabetes  06/03/2024   Medicare Annual Wellness Visit  06/30/2024   DTaP/Tdap/Td vaccine (3 - Td or Tdap) 02/25/2033   Pneumonia Vaccine  Completed   Flu Shot  Completed   Hepatitis C Screening  Completed   Zoster (Shingles) Vaccine  Completed   HPV Vaccine  Aged Out    Advanced directives: (In Chart) A copy of your advanced directives are scanned into your chart should your provider ever need it.  Next Medicare Annual Wellness Visit scheduled for next year: Yes 07/04/24 @ 10:10

## 2023-07-07 ENCOUNTER — Ambulatory Visit: Payer: Medicare PPO | Attending: Cardiology | Admitting: Cardiology

## 2023-07-07 ENCOUNTER — Encounter: Payer: Self-pay | Admitting: Cardiology

## 2023-07-07 VITALS — BP 120/72 | HR 73 | Ht 69.0 in | Wt 173.0 lb

## 2023-07-07 DIAGNOSIS — D6869 Other thrombophilia: Secondary | ICD-10-CM | POA: Diagnosis not present

## 2023-07-07 DIAGNOSIS — I447 Left bundle-branch block, unspecified: Secondary | ICD-10-CM | POA: Diagnosis not present

## 2023-07-07 DIAGNOSIS — I428 Other cardiomyopathies: Secondary | ICD-10-CM | POA: Diagnosis not present

## 2023-07-07 DIAGNOSIS — I5020 Unspecified systolic (congestive) heart failure: Secondary | ICD-10-CM

## 2023-07-07 DIAGNOSIS — I480 Paroxysmal atrial fibrillation: Secondary | ICD-10-CM

## 2023-07-07 DIAGNOSIS — I4891 Unspecified atrial fibrillation: Secondary | ICD-10-CM | POA: Diagnosis not present

## 2023-07-07 DIAGNOSIS — I429 Cardiomyopathy, unspecified: Secondary | ICD-10-CM

## 2023-07-07 NOTE — Patient Instructions (Addendum)
 Medication Instructions:  Your physician recommends that you continue on your current medications as directed. Please refer to the Current Medication list given to you today.  Labwork: None ordered.  Testing/Procedures: None ordered.  Follow-Up:  Your physician wants you to follow-up in:   1 year  Implantable Loop Recorder Placement, Care After This sheet gives you information about how to care for yourself after your procedure. Your health care provider may also give you more specific instructions. If you have problems or questions, contact your health care provider. What can I expect after the procedure? After the procedure, it is common to have: Soreness or discomfort near the incision. Some swelling or bruising near the incision.  Follow these instructions at home: Incision care  Monitor your cardiac device site for redness, swelling, and drainage. Call the device clinic at [phone] if you experience these symptoms or fever/chills.  Keep the large square bandage on your site for 24 hours and then you may remove it yourself. Keep the steri-strips underneath in place.   You may shower after 72 hours / 3 days from your procedure with the steri-strips in place. They will usually fall off on their own, or may be removed after 10 days. Pat dry.   Avoid lotions, ointments, or perfumes over your incision until it is well-healed.  Please do not submerge in water until your site is completely healed.   Your device is MRI compatible.   Remote monitoring is used to monitor your cardiac device from home. This monitoring is scheduled every month by our office. It allows [place] to keep an eye on the function of your device to ensure it is working properly.  If your wound site starts to bleed apply pressure.    If you have any questions/concerns please call the device clinic at [phone].  Activity  Return to your normal activities.  General instructions Follow instructions  from your health care provider about how to manage your implantable loop recorder and transmit the information. Learn how to activate a recording if this is necessary for your type of device. You may go through a metal detection gate, and you may let someone hold a metal detector over your chest. Show your ID card if needed. Do not have an MRI unless you check with your health care provider first. Take over-the-counter and prescription medicines only as told by your health care provider. Keep all follow-up visits as told by your health care provider. This is important. Contact a health care provider if: You have redness, swelling, or pain around your incision. You have a fever. You have pain that is not relieved by your pain medicine. You have triggered your device because of fainting (syncope) or because of a heartbeat that feels like it is racing, slow, fluttering, or skipping (palpitations). Get help right away if you have: Chest pain. Difficulty breathing. Summary After the procedure, it is common to have soreness or discomfort near the incision. Change your dressing as told by your health care provider. Follow instructions from your health care provider about how to manage your implantable loop recorder and transmit the information. Keep all follow-up visits as told by your health care provider. This is important. This information is not intended to replace advice given to you by your health care provider. Make sure you discuss any questions you have with your health care provider. Document Released: 04/09/2015 Document Revised: 06/13/2017 Document Reviewed: 06/13/2017 Elsevier Patient Education  2020 [employer].

## 2023-07-07 NOTE — Progress Notes (Unsigned)
 Electrophysiology Office Note:   Date:  07/08/2023  ID:  Rustyn, Conery 1945/08/08, MRN [id number]  Primary Cardiologist: Debbe Odea, MD Electrophysiologist: Nobie Putnam, MD      History of Present Illness:   Jasmine Maceachern is a 78 y.o. male with h/o 1 episode of atrial fibrillation in 2003, hypertension, diabetes, LBBB, chronic systolic heart failure who is being seen today for follow up evaluation.  First diagnosed with atrial fibrillation in January of 2003 after recovering from neck surgery. To his knowledge, has not had any episodes since. This appears to have been a lone postoperative episode. At last visit, we discussed loop recorder implant to monitor for AF. He presents today for scheduled loop recorder implant. Doing well. No new or acute complaints today.  Review of systems complete and found to be negative unless listed in HPI.   EP Information / Studies Reviewed:    EKG is ordered today. Personal review as below.  EKG Interpretation Date/Time:  Tuesday July 07 2023 13:41:06 EST Ventricular Rate:  73 PR Interval:  210 QRS Duration:  136 QT Interval:  424 QTC Calculation: 467 R Axis:   50  Text Interpretation: Sinus rhythm with 1st degree A-V block Left bundle branch block When compared with ECG of 17-Jun-2023 10:38, PR interval has increased Inferior leads Confirmed by Nobie Putnam [phone]) on 07/07/2023 1:47:54 PM  EKG 05/30/2001:   Zio 04/2023: Patient had a min HR of 60 bpm, max HR of 176 bpm, and avg HR of 78 bpm. Predominant underlying rhythm was Sinus Rhythm. First Degree AV Block was present. Bundle Branch Block/IVCD was present.3 Ventricular Tachycardia runs occurred, the run with the  fastest interval lasting 13.3 secs with a max rate of 176 bpm (avg 132 bpm); the run with the fastest interval was also the longest. 4 Supraventricular Tachycardia runs occurred, the run with the fastest interval lasting 16 beats with a max rate of 126   bpm (avg 108 bpm); the run with the fastest interval was also the longest. Isolated SVEs were rare (<1.0%), SVE Couplets were rare (<1.0%), and no SVE Triplets were present. Isolated VEs were rare (<1.0%), VE Couplets were rare (<1.0%), and no VE  Triplets were present. Ventricular Bigeminy and Trigeminy were present.   Conclusion Paroxysmal SVT noted, 3 episodes of nonsustained VT noted. No atrial fibrillation or atrial flutter.   Echo 05/20/2023: Moderately reduced LV systolic function.  LVEF 35 to 40%.  Moderate concentric LVH. Normal RV size and function. Mild MR.  No significant valvular disease. Left and right atrium are normal in size.  LHC 06/02/23:  Conclusions: Moderate two-vessel coronary artery disease, as detailed below, with 50-60% mid LAD stenosis and 40-50% proximal/mid dominant LCx lesion. Normal left ventricular filling pressure (LVEDP 14 mmHg).   Recommendations: Continue escalation of goal-directed medical therapy in the setting of nonischemic cardiomyopathy. Continue medical therapy and risk factor modification to prevent progression of coronary artery disease.  If the patient were to develop angina, functional study could be considered to assess the functional significance of LAD and LCx disease.   Physical Exam:   VS:  BP 120/72 (BP Location: Left Arm, Patient Position: Sitting, Cuff Size: Normal)   Pulse 73   Ht 5\' 9"  (1.753 m)   Wt 173 lb (78.5 kg)   SpO2 97%   BMI 25.55 kg/m    Wt Readings from Last 3 Encounters:  07/07/23 173 lb (78.5 kg)  07/01/23 175 lb (79.4 kg)  06/22/23 181 lb (  82.1 kg)     GEN: Well nourished, well developed in no acute distress NECK: No JVD CARDIAC: Normal rate, regular rhythm. RESPIRATORY:  Clear to auscultation without rales, wheezing or rhonchi  ABDOMEN: Soft, non-distended EXTREMITIES:  No edema; No deformity   ASSESSMENT AND PLAN:   Elisah Parmer is a 78 y.o. male with h/o 1 episode of atrial fibrillation in  2003, hypertension, diabetes, LBBB, chronic systolic heart failure who is being seen today for evaluation of his atrial fibrillation at the request of Dr. Azucena Cecil.   # Lone atrial fibrillation: We only know of 1 episode of atrial fibrillation which occurred in the postoperative setting back in 2023.  He does however have numerous risk factors for atrial fibrillation. # Secondary hypercoagulable state due to atrial fibrillation: -Given his presence of risk factors and only 1 known episode of atrial fibrillation and Zio monitor that was unremarkable.  I think that more extended monitoring for atrial fibrillation is warranted.  Given that there are no immediate plans for CRT, I think loop recorder implant would be reasonable and patient is agreeable. He presents today for scheduled implant. Explained risks, benefits, and alternatives to ILR implantation, including but not limited to bleeding, infection, damage to heart or lungs,. Pt verbalized understanding and agrees to proceed. -He has been off anticoagulation and has had no known recurrence.  I think it is reasonable for him to remain off anticoagulation as long as we pursue monitoring strategy as above.   # Left bundle branch block: By my assessment QRS is 148 ms in duration.  EKG with typical appearing left bundle. # Chronic systolic heart failure: New diagnosis of reduced LVEF 35 to 40%. #. NICM: LHC 06/02/23 with non-obstructive two vessel disease. Etiology is likely secondary to LBBB. -Patient has been on GDMT with lisinopril and carvedilol since at least October 2024.  More recently started on spironolactone.  I think it would be reasonable to repeat echocardiogram in 2-3 months to assess for improvement in his LV systolic function.  He does not currently have any symptoms of heart failure or evidence of clinical heart failure.  However, if LVEF remains low and ischemic evaluation is unremarkable, then pursuing CRT-P implant would be reasonable if  he were to develop clinical heart failure symptomatology in the future.  I have communicated this with my heart failure and referring colleagues.   SURGEON:  Nobie Putnam, MD     PREPROCEDURE DIAGNOSIS:  Atrial fibrillation    POSTPROCEDURE DIAGNOSIS: Atrial fibrillation     PROCEDURES:   1. Implantable loop recorder implantation    INTRODUCTION:  Ata Pecha presents with a history of atrial fibrillation The costs of loop recorder monitoring have been discussed with the patient.    DESCRIPTION OF PROCEDURE:  Informed written consent was obtained.   Time Out Completed with RN    The patient required no sedation for the procedure today.  Mapping over the patient's chest was performed to identify the area where electrograms were most prominent for ILR recording.  This area was found to be the left parasternal region over the 4th intercostal space. The patients left chest was therefore prepped and draped in the usual sterile fashion. The skin overlying the left parasternal region was infiltrated with lidocaine for local analgesia.  A 0.5-cm incision was made over the left parasternal region over the 3rd intercostal space.  A subcutaneous ILR pocket was fashioned using a combination of sharp and blunt dissection.  A [employer] LUX  implantable loop recorder (serial # [id number]) was then placed into the pocket  R waves were very prominent and measuring >0.67mV.  Steri- Strips and a sterile dressing were then applied.  There were no early apparent complications.     CONCLUSIONS:   1. Successful implantation of a implantable loop recorder for a history of atrial fibrillation.  2. No early apparent complications.   Signed, Nobie Putnam, MD

## 2023-07-08 NOTE — Telephone Encounter (Signed)
 Called patient about his Farxiga medication. Patient reports he was seen yesterday here in the office by Dr. Jimmey Ralph. Patient discussed cost of Farxiga and Dr. Jimmey Ralph had mentioned another medication Jardiance as another option. It does not appear that medication was ordered nor sent to his pharmacy. Advised that I will send provider a message and we will be in touch with his recommendations. Patient will most likely need assistance with either medication.

## 2023-07-09 ENCOUNTER — Telehealth: Payer: Self-pay | Admitting: Pulmonary Disease

## 2023-07-09 DIAGNOSIS — G4733 Obstructive sleep apnea (adult) (pediatric): Secondary | ICD-10-CM | POA: Diagnosis not present

## 2023-07-09 MED ORDER — EMPAGLIFLOZIN 10 MG PO TABS: 10.0000 mg | ORAL_TABLET | Freq: Every day | ORAL | 6 refills | Status: DC

## 2023-07-09 NOTE — Telephone Encounter (Signed)
 Call patient  Sleep study result  Date of study: 06/29/2023  Impression:  Severe obstructive sleep apnea with severe oxygen desaturations.  AHI of 61.6  Recommendation: Continue CPAP therapy  Close clinical follow-up for optimal treatment of severe obstructive sleep apnea, follow compliance closely to ensure sleep disordered breathing is adequately treated.  Auto CPAP 5-20 with heated humidification with patient's mask of choice will be an option of treatment  Follow closely

## 2023-07-09 NOTE — Telephone Encounter (Signed)
 I called and spoke with the patient. I advised him that London Pepper has been sent to the pharmacy to run through his insurance and check cost of the drug.  He will keep [place] updated is this is still cost prohibitive.  The patient voices understanding and was appreciative of the call back.

## 2023-07-09 NOTE — Telephone Encounter (Signed)
 Marianne Sofia, PA-C  Sent: Thu July 09, 2023  9:30 AM  To: Bryna Colander, RN; P [facility] Triage         Message  OK to try Jardiance if cheaper.

## 2023-07-09 NOTE — Telephone Encounter (Signed)
 Call patient  Sleep study result  Date of study:   Impression:   Recommendation:

## 2023-07-10 NOTE — Telephone Encounter (Signed)
 I have notified the patient. He is currently using a CPAP.  He would like to try the new mask that was suggested.   I have sent order to [facility] for the settings to be changed and the mask.  Nothing further needed.

## 2023-07-13 NOTE — Telephone Encounter (Signed)
 Order was placed on 07/10/23 but it wasn't signed until today can't send unsigned order to DME. It has now been sent to [facility]

## 2023-07-13 NOTE — Telephone Encounter (Signed)
 Patient called stating [facility] in [place] did not have the order yet. Please advise and give patient a call with an update.

## 2023-07-16 DIAGNOSIS — G4733 Obstructive sleep apnea (adult) (pediatric): Secondary | ICD-10-CM | POA: Diagnosis not present

## 2023-07-27 ENCOUNTER — Ambulatory Visit: Admitting: Anesthesiology

## 2023-07-27 ENCOUNTER — Encounter
Admission: RE | Disposition: A | Discharge status: Home / Self Care | Payer: Self-pay | Source: Home / Self Care | Attending: Gastroenterology

## 2023-07-27 ENCOUNTER — Other Ambulatory Visit: Payer: Self-pay

## 2023-07-27 ENCOUNTER — Ambulatory Visit
Admission: RE | Admit: 2023-07-27 | Discharge: 2023-07-27 | Disposition: A | Discharge status: Home / Self Care | Payer: Medicare PPO | Attending: Gastroenterology | Admitting: Gastroenterology

## 2023-07-27 ENCOUNTER — Encounter: Payer: Self-pay | Admitting: Anesthesiology

## 2023-07-27 DIAGNOSIS — K5730 Diverticulosis of large intestine without perforation or abscess without bleeding: Secondary | ICD-10-CM | POA: Diagnosis not present

## 2023-07-27 DIAGNOSIS — Z860101 Personal history of adenomatous and serrated colon polyps: Secondary | ICD-10-CM | POA: Diagnosis not present

## 2023-07-27 DIAGNOSIS — Z7984 Long term (current) use of oral hypoglycemic drugs: Secondary | ICD-10-CM | POA: Insufficient documentation

## 2023-07-27 DIAGNOSIS — I480 Paroxysmal atrial fibrillation: Secondary | ICD-10-CM | POA: Diagnosis not present

## 2023-07-27 DIAGNOSIS — F32A Depression, unspecified: Secondary | ICD-10-CM | POA: Insufficient documentation

## 2023-07-27 DIAGNOSIS — Z8673 Personal history of transient ischemic attack (TIA), and cerebral infarction without residual deficits: Secondary | ICD-10-CM | POA: Insufficient documentation

## 2023-07-27 DIAGNOSIS — G4733 Obstructive sleep apnea (adult) (pediatric): Secondary | ICD-10-CM | POA: Insufficient documentation

## 2023-07-27 DIAGNOSIS — Z09 Encounter for follow-up examination after completed treatment for conditions other than malignant neoplasm: Secondary | ICD-10-CM | POA: Diagnosis not present

## 2023-07-27 DIAGNOSIS — F419 Anxiety disorder, unspecified: Secondary | ICD-10-CM | POA: Insufficient documentation

## 2023-07-27 DIAGNOSIS — Z1211 Encounter for screening for malignant neoplasm of colon: Secondary | ICD-10-CM | POA: Diagnosis not present

## 2023-07-27 DIAGNOSIS — Q438 Other specified congenital malformations of intestine: Secondary | ICD-10-CM | POA: Diagnosis not present

## 2023-07-27 DIAGNOSIS — Z9049 Acquired absence of other specified parts of digestive tract: Secondary | ICD-10-CM | POA: Diagnosis not present

## 2023-07-27 DIAGNOSIS — Z9889 Other specified postprocedural states: Secondary | ICD-10-CM | POA: Insufficient documentation

## 2023-07-27 DIAGNOSIS — I5022 Chronic systolic (congestive) heart failure: Secondary | ICD-10-CM | POA: Insufficient documentation

## 2023-07-27 DIAGNOSIS — I2510 Atherosclerotic heart disease of native coronary artery without angina pectoris: Secondary | ICD-10-CM | POA: Insufficient documentation

## 2023-07-27 DIAGNOSIS — I110 Hypertensive heart disease with heart failure: Secondary | ICD-10-CM | POA: Diagnosis not present

## 2023-07-27 DIAGNOSIS — Z7985 Long-term (current) use of injectable non-insulin antidiabetic drugs: Secondary | ICD-10-CM | POA: Diagnosis not present

## 2023-07-27 DIAGNOSIS — I509 Heart failure, unspecified: Secondary | ICD-10-CM | POA: Insufficient documentation

## 2023-07-27 DIAGNOSIS — K219 Gastro-esophageal reflux disease without esophagitis: Secondary | ICD-10-CM | POA: Insufficient documentation

## 2023-07-27 DIAGNOSIS — Z860100 Personal history of colon polyps, unspecified: Secondary | ICD-10-CM | POA: Diagnosis not present

## 2023-07-27 DIAGNOSIS — Z87891 Personal history of nicotine dependence: Secondary | ICD-10-CM | POA: Diagnosis not present

## 2023-07-27 HISTORY — PX: COLONOSCOPY WITH PROPOFOL: SHX5780

## 2023-07-27 LAB — GLUCOSE, CAPILLARY: Glucose-Capillary: 116 mg/dL — ABNORMAL HIGH (ref 70–99)

## 2023-07-27 SURGERY — COLONOSCOPY WITH PROPOFOL
Anesthesia: General

## 2023-07-27 MED ORDER — PROPOFOL 1000 MG/100ML IV EMUL
INTRAVENOUS | Status: AC
  Filled 2023-07-27: qty 100

## 2023-07-27 MED ORDER — EPHEDRINE SULFATE-NACL 50-0.9 MG/10ML-% IV SOSY
PREFILLED_SYRINGE | INTRAVENOUS | Status: DC | PRN
  Administered 2023-07-27: 10 mg via INTRAVENOUS

## 2023-07-27 MED ORDER — SODIUM CHLORIDE 0.9 % IV SOLN: INTRAVENOUS | Status: DC

## 2023-07-27 MED ORDER — PROPOFOL 500 MG/50ML IV EMUL
INTRAVENOUS | Status: DC | PRN
  Administered 2023-07-27: 150 ug/kg/min via INTRAVENOUS

## 2023-07-27 MED ORDER — PROPOFOL 10 MG/ML IV BOLUS
INTRAVENOUS | Status: DC | PRN
  Administered 2023-07-27: 100 mg via INTRAVENOUS

## 2023-07-27 MED ORDER — EPHEDRINE 5 MG/ML INJ
INTRAVENOUS | Status: AC
  Filled 2023-07-27: qty 5

## 2023-07-27 NOTE — Anesthesia Preprocedure Evaluation (Signed)
 Anesthesia Evaluation  Patient identified by MRN, date of birth, ID band Patient awake    Reviewed: Allergy & Precautions, NPO status , Patient's Chart, lab work & pertinent test results  History of Anesthesia Complications (+) PONV and history of anesthetic complications  Airway Mallampati: III  TM Distance: >3 FB Neck ROM: full    Dental no notable dental hx.    Pulmonary sleep apnea and Continuous Positive Airway Pressure Ventilation , former smoker   Pulmonary exam normal        Cardiovascular hypertension, + CAD and +CHF  + dysrhythmias Atrial Fibrillation   EKG 2/25 Sinus rhythm with 1st degree A-V block Left bundle branch block When compared with ECG of 17-Jun-2023 10:38, PR interval has increased Inferior leads  LHC 1/25 Conclusions: 1. Moderate two-vessel coronary artery disease, as detailed below, with 50-60% mid LAD stenosis and 40-50% proximal/mid dominant LCx lesion. 2. Normal left ventricular filling pressure (LVEDP 14 mmHg).   Recommendations: 1. Continue escalation of goal-directed medical therapy in the setting of nonischemic cardiomyopathy. 2. Continue medical therapy and risk factor modification to prevent progression of coronary artery disease.  If the patient were to develop angina, functional study could be considered to assess the functional significance of LAD and LCx disease.    Neuro/Psych  PSYCHIATRIC DISORDERS Anxiety Depression    TIA Neuromuscular disease CVA    GI/Hepatic Neg liver ROS,GERD  Medicated,,  Endo/Other  negative endocrine ROSdiabetes    Renal/GU negative Renal ROS  negative genitourinary   Musculoskeletal   Abdominal   Peds  Hematology  (+) Blood dyscrasia, anemia   Anesthesia Other Findings Past Medical History: No date: Atrial fibrillation (HCC) No date: C. difficile colitis No date: CAD (coronary artery disease) No date: Chronic heart failure with mildly  reduced ejection fraction  (HFmrEF, 41-49%) (HCC) No date: Coronary artery disease No date: Depression No date: Diabetes mellitus (HCC) No date: Diverticulitis No date: Diverticulosis No date: Dysrhythmia     Comment:  PAF No date: Environmental allergies No date: GERD (gastroesophageal reflux disease) No date: Goiter     Comment:  intrathoracic, s/p benign biopsy (Dr Gerrit Friends) No date: Hypercholesterolemia No date: Hypertension No date: NICM (nonischemic cardiomyopathy) (HCC) No date: Osteoarthritis     Comment:  cervical spine, lumbar spine No date: Paroxysmal atrial fibrillation (HCC) No date: Pneumonia No date: PONV (postoperative nausea and vomiting) No date: Sleep apnea     Comment:  CPAP @ NIGHT No date: Stroke [facility]) No date: TIA (transient ischemic attack)  Past Surgical History: 12/2006: BACK SURGERY     Comment:  s/p fusion of L4-L5 2024: BACK SURGERY No date: capsule endoscopy 2011: CATARACT EXTRACTION     Comment:  Dr. Elmer Picker 2002: CERVICAL DISC SURGERY 2024: CHOLECYSTECTOMY 07/20/2017: COLONOSCOPY WITH PROPOFOL; N/A     Comment:  Procedure: COLONOSCOPY WITH PROPOFOL;  Surgeon: Scot Jun, MD;  Location: [facility] ENDOSCOPY;  Service:               Endoscopy;  Laterality: N/A; No date: EYE SURGERY     Comment:  cataract bilateral 10/1999 No date: eyelid reduction     Comment:  Dr. Chestine Spore 12/2022: gallbladder removed 1991: INGUINAL HERNIA REPAIR     Comment:  Dr, Katrinka Blazing 06/02/2023: LEFT HEART CATH AND CORONARY ANGIOGRAPHY; Left     Comment:  Procedure: LEFT HEART CATH AND CORONARY ANGIOGRAPHY;  Surgeon: Yvonne Kendall, MD;  Location: [facility] INVASIVE               CV LAB;  Service: Cardiovascular;  Laterality: Left; 10/15/2020: LUMBAR LAMINECTOMY/DECOMPRESSION MICRODISCECTOMY; N/A     Comment:  Procedure: Laminectomy - Lumbar Two-Lumbar Three -               Lumbar Three-Lumbar Four with sublaminar decompression;                 Surgeon: Tia Alert, MD;  Location: [facility];  Service:               Neurosurgery;  Laterality: N/A;  Laminectomy - Lumbar               Two-Lumbar Three - Lumbar Three-Lumbar Four with               sublaminar decompression No date: NOSE SURGERY     Comment:  turbinate reduction 1975: SEPTOPLASTY 2000: SHOULDER SURGERY     Comment:  rotator cuff     Reproductive/Obstetrics negative OB ROS                             Anesthesia Physical Anesthesia Plan  ASA: 3  Anesthesia Plan: General   Post-op Pain Management:    Induction: Intravenous  PONV Risk Score and Plan: 2 and Propofol infusion and TIVA  Airway Management Planned: Natural Airway and Nasal Cannula  Additional Equipment:   Intra-op Plan:   Post-operative Plan:   Informed Consent: I have reviewed the patients History and Physical, chart, labs and discussed the procedure including the risks, benefits and alternatives for the proposed anesthesia with the patient or authorized representative who has indicated his/her understanding and acceptance.     Dental Advisory Given  Plan Discussed with: Anesthesiologist, CRNA and Surgeon  Anesthesia Plan Comments: (Patient consented for risks of anesthesia including but not limited to:  - adverse reactions to medications - risk of airway placement if required - damage to eyes, teeth, lips or other oral mucosa - nerve damage due to positioning  - sore throat or hoarseness - Damage to heart, brain, nerves, lungs, other parts of body or loss of life  Patient voiced understanding and assent.)        Anesthesia Quick Evaluation

## 2023-07-27 NOTE — Op Note (Signed)
 [facility] Gastroenterology Patient Name: Ricardo Nash Procedure Date: 07/27/2023 12:08 PM MRN: [id number] Account #: [id number] Date of Birth: 05-04-1946 Admit Type: Outpatient Age: 78 Room: [facility] ENDO ROOM 3 Gender: Male Note Status: Finalized Instrument Name: Nelda Marseille [id number] Procedure:             Colonoscopy Indications:           Surveillance: Personal history of adenomatous polyps                         on last colonoscopy > 5 years ago Providers:             Eather Colas MD, MD Referring MD:          Dale Fingerville, MD (Referring MD) Medicines:             Monitored Anesthesia Care Complications:         No immediate complications. Procedure:             Pre-Anesthesia Assessment:                        - Prior to the procedure, a History and Physical was                         performed, and patient medications and allergies were                         reviewed. The patient is competent. The risks and                         benefits of the procedure and the sedation options and                         risks were discussed with the patient. All questions                         were answered and informed consent was obtained.                         Patient identification and proposed procedure were                         verified by the physician, the nurse, the                         anesthesiologist, the anesthetist and the technician                         in the endoscopy suite. Mental Status Examination:                         alert and oriented. Airway Examination: normal                         oropharyngeal airway and neck mobility. Respiratory                         Examination: clear to auscultation. CV Examination:  normal. Prophylactic Antibiotics: The patient does not                         require prophylactic antibiotics. Prior                         Anticoagulants: The patient has taken no  anticoagulant                         or antiplatelet agents. ASA Grade Assessment: III - A                         patient with severe systemic disease. After reviewing                         the risks and benefits, the patient was deemed in                         satisfactory condition to undergo the procedure. The                         anesthesia plan was to use monitored anesthesia care                         (MAC). Immediately prior to administration of                         medications, the patient was re-assessed for adequacy                         to receive sedatives. The heart rate, respiratory                         rate, oxygen saturations, blood pressure, adequacy of                         pulmonary ventilation, and response to care were                         monitored throughout the procedure. The physical                         status of the patient was re-assessed after the                         procedure.                        After obtaining informed consent, the colonoscope was                         passed under direct vision. Throughout the procedure,                         the patient's blood pressure, pulse, and oxygen                         saturations were monitored continuously. The  Colonoscope was introduced through the anus and                         advanced to the the cecum, identified by appendiceal                         orifice and ileocecal valve. The colonoscopy was                         somewhat difficult due to a redundant colon.                         Successful completion of the procedure was aided by                         applying abdominal pressure. The patient tolerated the                         procedure well. The quality of the bowel preparation                         was good. The ileocecal valve, appendiceal orifice,                         and rectum were photographed. Findings:      The  perianal and digital rectal examinations were normal.      Multiple small-mouthed diverticula were found in the sigmoid colon and       descending colon.      The exam was otherwise without abnormality on direct and retroflexion       views. Impression:            - Diverticulosis in the sigmoid colon and in the                         descending colon.                        - The examination was otherwise normal on direct and                         retroflexion views.                        - No specimens collected. Recommendation:        - Discharge patient to home.                        - Resume previous diet.                        - Continue present medications.                        - Repeat colonoscopy is not recommended due to current                         age (85 years or older) for surveillance.                        -  Return to referring physician as previously                         scheduled. Procedure Code(s):     --- Professional ---                        Z6109, Colorectal cancer screening; colonoscopy on                         individual at high risk Diagnosis Code(s):     --- Professional ---                        Z86.010, Personal history of colonic polyps                        K57.30, Diverticulosis of large intestine without                         perforation or abscess without bleeding CPT copyright 2022 American Medical Association. All rights reserved. The codes documented in this report are preliminary and upon coder review may  be revised to meet current compliance requirements. Eather Colas MD, MD 07/27/2023 12:57:43 PM Number of Addenda: 0 Note Initiated On: 07/27/2023 12:08 PM Scope Withdrawal Time: 0 hours 8 minutes 57 seconds  Total Procedure Duration: 0 hours 18 minutes 21 seconds  Estimated Blood Loss:  Estimated blood loss: none.      [facility]

## 2023-07-27 NOTE — Interval H&P Note (Signed)
 History and Physical Interval Note:  07/27/2023 12:15 PM  Ricardo Nash [facility]  has presented today for surgery, with the diagnosis of PH Colon Polyps.  The various methods of treatment have been discussed with the patient and family. After consideration of risks, benefits and other options for treatment, the patient has consented to  Procedure(s): COLONOSCOPY WITH PROPOFOL (N/A) as a surgical intervention.  The patient's history has been reviewed, patient examined, no change in status, stable for surgery.  I have reviewed the patient's chart and labs.  Questions were answered to the patient's satisfaction.     Regis Bill  Ok to proceed with colonoscopy

## 2023-07-27 NOTE — H&P (Signed)
 Outpatient short stay form Pre-procedure 07/27/2023  Regis Bill, MD  Primary Physician: Dale Dawes, MD  Reason for visit:  Surveillance  History of present illness:    78 y/o gentleman with history of HFrEF, CAD, DM II, and OSA here for surveillance colonoscopy. Last colonoscopy in 2019 with small TA. No blood thinners. No family history of GI malignancies. History of cholecystectomy and inguinal hernia repair.    Current Facility-Administered Medications:    0.9 %  sodium chloride infusion, , Intravenous, Continuous, Jarren Para, Rossie Muskrat, MD, Last Rate: 20 mL/hr at 07/27/23 1214, Continued from Pre-op at 07/27/23 1214  Medications Prior to Admission  Medication Sig Dispense Refill Last Dose/Taking   aspirin EC 81 MG tablet Take 81 mg by mouth every evening. Swallow whole.   07/26/2023   atorvastatin (LIPITOR) 40 MG tablet TAKE 1 TABLET BY MOUTH ONCE EVERY EVENING 90 tablet 0 07/26/2023   carvedilol (COREG) 25 MG tablet TAKE 1 TABLET BY MOUTH TWICE DAILY WITH MEALS 180 tablet 1 07/26/2023   empagliflozin (JARDIANCE) 10 MG TABS tablet Take 1 tablet (10 mg total) by mouth daily before breakfast. 30 tablet 6 Past Week   levocetirizine (XYZAL) 5 MG tablet TAKE 1 TABLET BY MOUTH ONCE EVERY EVENING 90 tablet 2 07/26/2023   losartan (COZAAR) 50 MG tablet Take 1 tablet (50 mg total) by mouth daily. 30 tablet 6 07/26/2023   metFORMIN (GLUCOPHAGE) 1000 MG tablet TAKE 1 TABLET BY MOUTH TWICE DAILY WITH MEALS 180 tablet 1 Past Week   montelukast (SINGULAIR) 10 MG tablet Take 1 tablet (10 mg total) by mouth at bedtime. 90 tablet 2 07/26/2023   pantoprazole (PROTONIX) 40 MG tablet TAKE 1 TABLET BY MOUTH ONCE DAILY 90 tablet 1 07/26/2023   pioglitazone (ACTOS) 15 MG tablet TAKE 1 TABLET BY MOUTH ONCE DAILY 90 tablet 1 Past Week   sertraline (ZOLOFT) 100 MG tablet TAKE 1 TABLET BY MOUTH ONCE DAILY 90 tablet 1 07/26/2023   tamsulosin (FLOMAX) 0.4 MG CAPS capsule Take 2 capsules (0.8 mg total) by  mouth daily after supper. 120 capsule 3 07/26/2023   traZODone (DESYREL) 50 MG tablet Take 1 tablet (50 mg total) by mouth at bedtime as needed for sleep. 90 tablet 1 07/26/2023   ACCU-CHEK GUIDE TEST test strip USE AS DIRECTED TO TEST BLOOD GLUCOSE LEVELS TWICE DAILY 100 each 3    ALPRAZolam (XANAX) 0.25 MG tablet Take 1 tablet (0.25 mg total) by mouth daily as needed. 30 tablet 0    azelastine (OPTIVAR) 0.05 % ophthalmic solution Apply to eye.      Azelastine-Fluticasone 137-50 MCG/ACT SUSP PLACE 1 SPRAY INTO EACH NOSTRIL ONCE EVERY MORNING AND AT BEDTIME 23 g 3    blood glucose meter kit and supplies Dispense based on patient and insurance preference. Use up to four times daily as directed. (FOR ICD-10 E10.9, E11.9). 1 each 0    BOOSTRIX 5-2.5-18.5 LF-MCG/0.5 injection Inject 0.5 mLs into the muscle once. (Patient not taking: Reported on 07/07/2023)      fluorometholone (FML) 0.1 % ophthalmic suspension SMARTSIG:In Eye(s)      Multiple Vitamin (MULTIVITAMIN WITH MINERALS) TABS tablet Take 1 tablet by mouth in the morning.      ondansetron (ZOFRAN) 4 MG tablet Take 1 tablet (4 mg total) by mouth every 8 (eight) hours as needed for nausea or vomiting. 21 tablet 0    spironolactone (ALDACTONE) 25 MG tablet Take 1 tablet (25 mg total) by mouth daily. 90 tablet 3  testosterone cypionate (DEPOTESTOSTERONE CYPIONATE) 200 MG/ML injection INJECT 0.5MLS INTRAMUSCULARLY EVERY 14 DAYS 5 mL 5    TRULICITY 3 MG/0.5ML SOAJ INJECT 3MG  SUBCUTANEOUSLY ONCE A WEEK 6 mL 1 06/21/2023     Allergies  Allergen Reactions   Penicillins Hives    Childhood allergy (age 79)   Penicillin G     Other Reaction(s): Weal (disorder)     Past Medical History:  Diagnosis Date   Atrial fibrillation (HCC)    C. difficile colitis    CAD (coronary artery disease)    Chronic heart failure with mildly reduced ejection fraction (HFmrEF, 41-49%) (HCC)    Coronary artery disease    Depression    Diabetes mellitus (HCC)     Diverticulitis    Diverticulosis    Dysrhythmia    PAF   Environmental allergies    GERD (gastroesophageal reflux disease)    Goiter    intrathoracic, s/p benign biopsy (Dr Gerrit Friends)   Hypercholesterolemia    Hypertension    NICM (nonischemic cardiomyopathy) (HCC)    Osteoarthritis    cervical spine, lumbar spine   Paroxysmal atrial fibrillation (HCC)    Pneumonia    PONV (postoperative nausea and vomiting)    Sleep apnea    CPAP @ NIGHT   Stroke (HCC)    TIA (transient ischemic attack)     Review of systems:  Otherwise negative.    Physical Exam  Gen: Alert, oriented. Appears stated age.  HEENT: PERRLA. Lungs: No respiratory distress CV: RRR Abd: soft, benign, no masses Ext: No edema    Planned procedures: Proceed with colonoscopy. The patient understands the nature of the planned procedure, indications, risks, alternatives and potential complications including but not limited to bleeding, infection, perforation, damage to internal organs and possible oversedation/side effects from anesthesia. The patient agrees and gives consent to proceed.  Please refer to procedure notes for findings, recommendations and patient disposition/instructions.     Regis Bill, MD [facility]

## 2023-07-27 NOTE — Transfer of Care (Addendum)
 Immediate Anesthesia Transfer of Care Note  Patient: Ricardo Nash  Procedure(s) Performed: COLONOSCOPY WITH PROPOFOL  Patient Location: PACU  Anesthesia Type:General  Level of Consciousness: awake and sedated  Airway & Oxygen Therapy: Patient Spontanous Breathing and Patient connected to nasal cannula oxygen  Post-op Assessment: Report given to RN and Post -op Vital signs reviewed and stable  Post vital signs: Reviewed and stable  Last Vitals:  Vitals Value Taken Time  BP    Temp    Pulse    Resp    SpO2      Last Pain:  Vitals:   07/27/23 1148  TempSrc: Temporal  PainSc: 0-No pain         Complications: There were no known notable events for this encounter.

## 2023-07-28 ENCOUNTER — Encounter: Payer: Self-pay | Admitting: Gastroenterology

## 2023-07-28 LAB — SURGICAL PATHOLOGY

## 2023-07-28 NOTE — Anesthesia Postprocedure Evaluation (Signed)
 Anesthesia Post Note  Patient: Ricardo Nash  Procedure(s) Performed: COLONOSCOPY WITH PROPOFOL  Patient location during evaluation: Endoscopy Anesthesia Type: General Level of consciousness: awake and alert Pain management: pain level controlled Vital Signs Assessment: post-procedure vital signs reviewed and stable Respiratory status: spontaneous breathing, nonlabored ventilation, respiratory function stable and patient connected to nasal cannula oxygen Cardiovascular status: blood pressure returned to baseline and stable Postop Assessment: no apparent nausea or vomiting Anesthetic complications: no   There were no known notable events for this encounter.   Last Vitals:  Vitals:   07/27/23 1258 07/27/23 1308  BP: (!) 110/53 137/63  Pulse: 65   Resp: (!) 22   Temp:    SpO2: 94%     Last Pain:  Vitals:   07/28/23 0739  TempSrc:   PainSc: 0-No pain                 Louie Boston

## 2023-08-10 ENCOUNTER — Ambulatory Visit: Payer: Medicare PPO

## 2023-08-10 DIAGNOSIS — I480 Paroxysmal atrial fibrillation: Secondary | ICD-10-CM

## 2023-08-10 LAB — CUP PACEART REMOTE DEVICE CHECK
Date Time Interrogation Session: 20250331001100
Implantable Pulse Generator Implant Date: 20250225
Pulse Gen Serial Number: 135133

## 2023-08-15 ENCOUNTER — Other Ambulatory Visit: Payer: Self-pay | Admitting: Internal Medicine

## 2023-08-17 ENCOUNTER — Ambulatory Visit: Payer: Medicare PPO | Attending: Cardiology | Admitting: Cardiology

## 2023-08-17 ENCOUNTER — Encounter: Payer: Self-pay | Admitting: Cardiology

## 2023-08-17 VITALS — BP 116/74 | HR 87 | Ht 69.0 in | Wt 177.8 lb

## 2023-08-17 DIAGNOSIS — I10 Essential (primary) hypertension: Secondary | ICD-10-CM | POA: Diagnosis not present

## 2023-08-17 DIAGNOSIS — I4891 Unspecified atrial fibrillation: Secondary | ICD-10-CM

## 2023-08-17 DIAGNOSIS — I2510 Atherosclerotic heart disease of native coronary artery without angina pectoris: Secondary | ICD-10-CM

## 2023-08-17 DIAGNOSIS — I428 Other cardiomyopathies: Secondary | ICD-10-CM | POA: Diagnosis not present

## 2023-08-17 NOTE — Progress Notes (Signed)
 Cardiology Office Note:    Date:  08/17/2023   ID:  Ricardo Nash, DOB 1945/10/21, MRN [id number]  PCP:  Dale Lafayette, MD   [facility] Providers Cardiologist:  Debbe Odea, MD Electrophysiologist:  Nobie Putnam, MD     Referring MD: Dale Dayton, MD   No chief complaint on file.   History of Present Illness:    Ricardo Nash is a 78 y.o. male with a hx of Nonobstructive CAD ( LHC 05/2023- 55% mid LAD, 45% proximal Lcx), NICM EF 40%, hypertension, lone atrial fibrillation s/p ILR 2/25, diabetes who presents for follow-up.  Initially seen due to history of A-fib.  Echo was obtained showing moderately reduced EF 35 to 40%.  Cardiac monitor did not show any A-fib recurrence.  Followed up with EP, ILR was placed.  Device check so far has not revealed any A-fib or flutter.  Started on GDMT for heart failure, tolerating medications, no adverse effects.  Feels well, denies shortness of breath or edema.  Exercises almost daily by walking, occasionally goes to the gym with his wife.  Prior notes/testing Echo 05/2023 EF 35 to 40%. LHC 1/25, 50% mid LAD, 45% proximal LCx. Echo 06/2021 EF 40 to 45% reported.  Reviewed by myself roughly 50%.  Septal bounce, consistent with bundle branch block. Exercise MPI 12/2017 no evidence for ischemia.  Past Medical History:  Diagnosis Date   Atrial fibrillation (HCC)    C. difficile colitis    CAD (coronary artery disease)    Chronic heart failure with mildly reduced ejection fraction (HFmrEF, 41-49%) (HCC)    Coronary artery disease    Depression    Diabetes mellitus (HCC)    Diverticulitis    Diverticulosis    Dysrhythmia    PAF   Environmental allergies    GERD (gastroesophageal reflux disease)    Goiter    intrathoracic, s/p benign biopsy (Dr Gerrit Friends)   Hypercholesterolemia    Hypertension    NICM (nonischemic cardiomyopathy) (HCC)    Osteoarthritis    cervical spine, lumbar spine   Paroxysmal atrial  fibrillation (HCC)    Pneumonia    PONV (postoperative nausea and vomiting)    Sleep apnea    CPAP @ NIGHT   Stroke [facility])    TIA (transient ischemic attack)     Past Surgical History:  Procedure Laterality Date   BACK SURGERY  12/2006   s/p fusion of L4-L5   BACK SURGERY  2024   capsule endoscopy     CATARACT EXTRACTION  2011   Dr. Elmer Picker   CERVICAL DISC SURGERY  2002   CHOLECYSTECTOMY  2024   COLONOSCOPY WITH PROPOFOL N/A 07/20/2017   Procedure: COLONOSCOPY WITH PROPOFOL;  Surgeon: Scot Jun, MD;  Location[facility] ENDOSCOPY;  Service: Endoscopy;  Laterality: N/A;   COLONOSCOPY WITH PROPOFOL N/A 07/27/2023   Procedure: COLONOSCOPY WITH PROPOFOL;  Surgeon: Regis Bill, MD;  Location: [facility] ENDOSCOPY;  Service: Endoscopy;  Laterality: N/A;   EYE SURGERY     cataract bilateral 10/1999   eyelid reduction     Dr. Chestine Spore   gallbladder removed  12/2022   INGUINAL HERNIA REPAIR  1991   Dr, Katrinka Blazing   LEFT HEART CATH AND CORONARY ANGIOGRAPHY Left 06/02/2023   Procedure: LEFT HEART CATH AND CORONARY ANGIOGRAPHY;  Surgeon: Yvonne Kendall, MD;  Location: [facility] INVASIVE CV LAB;  Service: Cardiovascular;  Laterality: Left;   LUMBAR LAMINECTOMY/DECOMPRESSION MICRODISCECTOMY N/A 10/15/2020   Procedure: Laminectomy - Lumbar Two-Lumbar Three - Lumbar  Three-Lumbar Four with sublaminar decompression;  Surgeon: Tia Alert, MD;  Location[facility] OR;  Service: Neurosurgery;  Laterality: N/A;  Laminectomy - Lumbar Two-Lumbar Three - Lumbar Three-Lumbar Four with sublaminar decompression   NOSE SURGERY     turbinate reduction   SEPTOPLASTY  1975   SHOULDER SURGERY  2000   rotator cuff   spinal stimulator      Current Medications: Current Meds  Medication Sig   ACCU-CHEK GUIDE TEST test strip USE AS DIRECTED TO TEST BLOOD GLUCOSE LEVELS TWICE DAILY   ALPRAZolam (XANAX) 0.25 MG tablet Take 1 tablet (0.25 mg total) by mouth daily as needed.   aspirin EC 81 MG tablet Take 81 mg by mouth  every evening. Swallow whole.   atorvastatin (LIPITOR) 40 MG tablet TAKE 1 TABLET BY MOUTH ONCE EVERY EVENING   azelastine (OPTIVAR) 0.05 % ophthalmic solution Apply to eye.   Azelastine-Fluticasone 137-50 MCG/ACT SUSP PLACE 1 SPRAY INTO EACH NOSTRIL ONCE EVERY MORNING AND AT BEDTIME   blood glucose meter kit and supplies Dispense based on patient and insurance preference. Use up to four times daily as directed. (FOR ICD-10 E10.9, E11.9).   BOOSTRIX 5-2.5-18.5 LF-MCG/0.5 injection Inject 0.5 mLs into the muscle once.   carvedilol (COREG) 25 MG tablet TAKE 1 TABLET BY MOUTH TWICE DAILY WITH MEALS   empagliflozin (JARDIANCE) 10 MG TABS tablet Take 1 tablet (10 mg total) by mouth daily before breakfast.   fluorometholone (FML) 0.1 % ophthalmic suspension SMARTSIG:In Eye(s)   levocetirizine (XYZAL) 5 MG tablet TAKE 1 TABLET BY MOUTH ONCE EVERY EVENING   losartan (COZAAR) 50 MG tablet Take 1 tablet (50 mg total) by mouth daily.   metFORMIN (GLUCOPHAGE) 1000 MG tablet TAKE 1 TABLET BY MOUTH TWICE DAILY WITH MEALS   montelukast (SINGULAIR) 10 MG tablet Take 1 tablet (10 mg total) by mouth at bedtime.   Multiple Vitamin (MULTIVITAMIN WITH MINERALS) TABS tablet Take 1 tablet by mouth in the morning.   ondansetron (ZOFRAN) 4 MG tablet Take 1 tablet (4 mg total) by mouth every 8 (eight) hours as needed for nausea or vomiting.   pantoprazole (PROTONIX) 40 MG tablet TAKE 1 TABLET BY MOUTH ONCE DAILY   pioglitazone (ACTOS) 15 MG tablet TAKE 1 TABLET BY MOUTH ONCE DAILY   sertraline (ZOLOFT) 100 MG tablet TAKE 1 TABLET BY MOUTH ONCE DAILY   spironolactone (ALDACTONE) 25 MG tablet Take 1 tablet (25 mg total) by mouth daily.   tamsulosin (FLOMAX) 0.4 MG CAPS capsule Take 2 capsules (0.8 mg total) by mouth daily after supper.   testosterone cypionate (DEPOTESTOSTERONE CYPIONATE) 200 MG/ML injection INJECT 0.5MLS INTRAMUSCULARLY EVERY 14 DAYS   traZODone (DESYREL) 50 MG tablet Take 1 tablet (50 mg total) by mouth  at bedtime as needed for sleep.   TRULICITY 3 MG/0.5ML SOAJ INJECT 3MG  SUBCUTANEOUSLY ONCE A WEEK     Allergies:   Penicillins and Penicillin g   Social History   Socioeconomic History   Marital status: Married    Spouse name: Not on file   Number of children: 2   Years of education: Not on file   Highest education level: Not on file  Occupational History   Occupation: retired Runner, broadcasting/film/video  Tobacco Use   Smoking status: Former    Passive exposure: Never   Smokeless tobacco: Never  Vaping Use   Vaping status: Never Used  Substance and Sexual Activity   Alcohol use: Yes    Alcohol/week: 0.0 standard drinks of alcohol  Comment: occasional   Drug use: No   Sexual activity: Not Currently  Other Topics Concern   Not on file  Social History Narrative   Not on file   Social Drivers of Health   Financial Resource Strain: Low Risk  (07/01/2023)   Overall Financial Resource Strain (CARDIA)    Difficulty of Paying Living Expenses: Not hard at all  Food Insecurity: No Food Insecurity (07/01/2023)   Hunger Vital Sign    Worried About Running Out of Food in the Last Year: Never true    Ran Out of Food in the Last Year: Never true  Transportation Needs: No Transportation Needs (07/01/2023)   PRAPARE - Administrator, Civil Service (Medical): No    Lack of Transportation (Non-Medical): No  Physical Activity: Inactive (07/01/2023)   Exercise Vital Sign    Days of Exercise per Week: 0 days    Minutes of Exercise per Session: 0 min  Stress: No Stress Concern Present (07/01/2023)   [employer] of Occupational Health - Occupational Stress Questionnaire    Feeling of Stress : Only a little  Social Connections: Socially Integrated (07/01/2023)   Social Connection and Isolation Panel [NHANES]    Frequency of Communication with Friends and Family: More than three times a week    Frequency of Social Gatherings with Friends and Family: Three times a week    Attends Religious  Services: More than 4 times per year    Active Member of Clubs or Organizations: Yes    Attends Engineer, structural: More than 4 times per year    Marital Status: Married     Family History: The patient's family history includes Congestive Heart Failure in his father; Heart disease in his father and mother; Kidney disease in his sister; Rheumatic fever in his father. There is no history of Colon cancer or Prostate cancer.  ROS:   Please see the history of present illness.     All other systems reviewed and are negative.  EKGs/Labs/Other Studies Reviewed:    The following studies were reviewed today:       Recent Labs: 10/08/2022: TSH 0.89 05/26/2023: ALT 19; BUN 17; Creatinine, Ser 1.00; Hemoglobin 13.1; Platelets 242; Potassium 4.7; Sodium 145  Recent Lipid Panel    Component Value Date/Time   CHOL 117 05/26/2023 1620   TRIG 185 (H) 05/26/2023 1620   HDL 37 (L) 05/26/2023 1620   CHOLHDL 3.2 05/26/2023 1620   CHOLHDL 3 01/13/2023 0840   VLDL 39.0 01/13/2023 0840   LDLCALC 49 05/26/2023 1620   LDLDIRECT 72.0 10/08/2022 0745     Risk Assessment/Calculations:             Physical Exam:    VS:  BP 116/74   Pulse 87   Ht 5\' 9"  (1.753 m)   Wt 177 lb 12.8 oz (80.6 kg)   SpO2 97%   BMI 26.26 kg/m     Wt Readings from Last 3 Encounters:  08/17/23 177 lb 12.8 oz (80.6 kg)  07/27/23 176 lb 3.2 oz (79.9 kg)  07/07/23 173 lb (78.5 kg)     GEN:  Well nourished, well developed in no acute distress HEENT: Normal NECK: No JVD; No carotid bruits CARDIAC: RRR, no murmurs, rubs, gallops RESPIRATORY:  Clear to auscultation without rales, wheezing or rhonchi  ABDOMEN: Soft, non-tender, non-distended MUSCULOSKELETAL:  No edema; No deformity  SKIN: Warm and dry NEUROLOGIC:  Alert and oriented x 3 PSYCHIATRIC:  Normal affect  ASSESSMENT:    1. Lone atrial fibrillation (HCC)   2. NICM (nonischemic cardiomyopathy) (HCC)   3. Coronary artery disease involving  native coronary artery of native heart, unspecified whether angina present   4. Primary hypertension    PLAN:    In order of problems listed above:  Atrial fibrillation, diagnosed in 2002.  Unsure if this represents lone A-fib or paroxysmal.  Cardiac monitor showed no A-fib or flutter.  Has ILR in place, implanted 06/2023 no A-fib or flutter so far.  Continue Coreg, aspirin 81 mg daily. Nonischemic cardiomyopathy EF 35 to 40%. etiology possible left bundle branch block.  NYHA class II symptoms.  Appears euvolemic.  Continue Coreg 25 mg twice daily, losartan 50 mg daily, spironolactone 25 mg daily, Jardiance 10 mg daily.  Repeat echo in 3 months.  If EF stays the same, consider switch to [facility]. Nonobstructive CAD (55% mid LAD, 45% proximal Lcx), denies chest pain.  Continue aspirin 81 mg daily, Lipitor 40 mg daily. Hypertension, BP controlled.  Continue Coreg 25 mg twice daily, Maxzide, lisinopril.   Follow-up in 5 months      Medication Adjustments/Labs and Tests Ordered: Current medicines are reviewed at length with the patient today.  Concerns regarding medicines are outlined above.  Orders Placed This Encounter  Procedures   ECHOCARDIOGRAM COMPLETE   No orders of the defined types were placed in this encounter.   Patient Instructions  Medication Instructions:   Your Physician recommend you continue on your current medication as directed.     *If you need a refill on your cardiac medications before your next appointment, please call your pharmacy*  Lab Work:  No labs ordered today.   Testing/Procedures:  Your physician has requested that you have an echocardiogram. Echocardiography is a painless test that uses sound waves to create images of your heart. It provides your doctor with information about the size and shape of your heart and how well your heart's chambers and valves are working.   You may receive an ultrasound enhancing agent through an IV if needed to better  visualize your heart during the echo. This procedure takes approximately one hour.  There are no restrictions for this procedure.  This will take place at [address] [facility] [address]) [address], [place] [address]  Please note: We ask at that you not bring children with you during ultrasound (echo/ vascular) testing. Due to room size and safety concerns, children are not allowed in the ultrasound rooms during exams. Our front office staff cannot provide observation of children in our lobby area while testing is being conducted. An adult accompanying a patient to their appointment will only be allowed in the ultrasound room at the discretion of the ultrasound technician under special circumstances. We apologize for any inconvenience.   Follow-Up: At [facility], you and your health needs are our priority.  As part of our continuing mission to provide you with exceptional heart care, our providers are all part of one team.  This team includes your primary Cardiologist (physician) and Advanced Practice Providers or APPs (Physician Assistants and Nurse Practitioners) who all work together to provide you with the care you need, when you need it.  Your next appointment:   4-5 month(s)  Provider:   You may see Debbe Odea, MD or one of the following Advanced Practice Providers on your designated Care Team:   Nicolasa Ducking, NP Ames Dura, PA-C Eula Listen, PA-C Cadence Doran, PA-C Charlsie Quest, NP Carlos Levering,  NP    We recommend signing up for the patient portal called "MyChart".  Sign up information is provided on this After Visit Summary.  MyChart is used to connect with patients for Virtual Visits (Telemedicine).  Patients are able to view lab/test results, encounter notes, upcoming appointments, etc.  Non-urgent messages can be sent to your provider as well.   To learn more about what you can do with MyChart, go to [email or web address].          Signed, Debbe Odea, MD  08/17/2023 10:42 AM    [facility]

## 2023-08-17 NOTE — Patient Instructions (Signed)
 Medication Instructions:   Your Physician recommend you continue on your current medication as directed.     *If you need a refill on your cardiac medications before your next appointment, please call your pharmacy*  Lab Work:  No labs ordered today.   Testing/Procedures:  Your physician has requested that you have an echocardiogram. Echocardiography is a painless test that uses sound waves to create images of your heart. It provides your doctor with information about the size and shape of your heart and how well your heart's chambers and valves are working.   You may receive an ultrasound enhancing agent through an IV if needed to better visualize your heart during the echo. This procedure takes approximately one hour.  There are no restrictions for this procedure.  This will take place at [address] [facility] [address]) [address], [place] [address]  Please note: We ask at that you not bring children with you during ultrasound (echo/ vascular) testing. Due to room size and safety concerns, children are not allowed in the ultrasound rooms during exams. Our front office staff cannot provide observation of children in our lobby area while testing is being conducted. An adult accompanying a patient to their appointment will only be allowed in the ultrasound room at the discretion of the ultrasound technician under special circumstances. We apologize for any inconvenience.   Follow-Up: At [facility], you and your health needs are our priority.  As part of our continuing mission to provide you with exceptional heart care, our providers are all part of one team.  This team includes your primary Cardiologist (physician) and Advanced Practice Providers or APPs (Physician Assistants and Nurse Practitioners) who all work together to provide you with the care you need, when you need it.  Your next appointment:   4-5 month(s)  Provider:   You may see Debbe Odea, MD or one of  the following Advanced Practice Providers on your designated Care Team:   Nicolasa Ducking, NP Ames Dura, PA-C Eula Listen, PA-C Cadence Meridian, PA-C Charlsie Quest, NP Carlos Levering, NP    We recommend signing up for the patient portal called "MyChart".  Sign up information is provided on this After Visit Summary.  MyChart is used to connect with patients for Virtual Visits (Telemedicine).  Patients are able to view lab/test results, encounter notes, upcoming appointments, etc.  Non-urgent messages can be sent to your provider as well.   To learn more about what you can do with MyChart, go to [email or web address].

## 2023-08-19 DIAGNOSIS — G4733 Obstructive sleep apnea (adult) (pediatric): Secondary | ICD-10-CM | POA: Diagnosis not present

## 2023-08-20 ENCOUNTER — Telehealth: Payer: Self-pay | Admitting: Cardiology

## 2023-08-20 NOTE — Telephone Encounter (Signed)
 Called to sched recall

## 2023-08-25 ENCOUNTER — Encounter: Admitting: Cardiology

## 2023-08-31 ENCOUNTER — Other Ambulatory Visit: Payer: Self-pay | Admitting: Internal Medicine

## 2023-09-02 ENCOUNTER — Other Ambulatory Visit: Payer: Self-pay

## 2023-09-02 MED ORDER — ATORVASTATIN CALCIUM 40 MG PO TABS: ORAL_TABLET | ORAL | 0 refills | Status: DC

## 2023-09-10 ENCOUNTER — Ambulatory Visit (INDEPENDENT_AMBULATORY_CARE_PROVIDER_SITE_OTHER): Payer: Medicare PPO

## 2023-09-10 ENCOUNTER — Ambulatory Visit: Attending: Cardiology

## 2023-09-10 DIAGNOSIS — I2510 Atherosclerotic heart disease of native coronary artery without angina pectoris: Secondary | ICD-10-CM | POA: Diagnosis not present

## 2023-09-10 DIAGNOSIS — I4891 Unspecified atrial fibrillation: Secondary | ICD-10-CM | POA: Diagnosis not present

## 2023-09-10 DIAGNOSIS — I428 Other cardiomyopathies: Secondary | ICD-10-CM | POA: Diagnosis not present

## 2023-09-10 LAB — ECHOCARDIOGRAM COMPLETE
AR max vel: 3.02 cm2
AV Area VTI: 2.94 cm2
AV Area mean vel: 2.84 cm2
AV Mean grad: 3 mmHg
AV Peak grad: 6.4 mmHg
Ao pk vel: 1.26 m/s
Area-P 1/2: 3.54 cm2
Calc EF: 41.5 %
S' Lateral: 3.6 cm
Single Plane A2C EF: 42.1 %
Single Plane A4C EF: 40.3 %

## 2023-09-14 LAB — CUP PACEART REMOTE DEVICE CHECK
Date Time Interrogation Session: 20250505005600
Implantable Pulse Generator Implant Date: 20250225
Pulse Gen Serial Number: 135133

## 2023-09-15 ENCOUNTER — Other Ambulatory Visit: Payer: Self-pay

## 2023-09-15 MED ORDER — ENTRESTO 49-51 MG PO TABS: 1.0000 | ORAL_TABLET | Freq: Two times a day (BID) | ORAL | 1 refills | Status: DC

## 2023-09-15 NOTE — Addendum Note (Signed)
 Addended by: Hollace Lund on: 09/15/2023 10:31 AM   Modules accepted: Orders

## 2023-09-22 DIAGNOSIS — G4733 Obstructive sleep apnea (adult) (pediatric): Secondary | ICD-10-CM | POA: Diagnosis not present

## 2023-09-24 NOTE — Progress Notes (Signed)
 Clarity Loop Recorder

## 2023-09-29 ENCOUNTER — Other Ambulatory Visit (INDEPENDENT_AMBULATORY_CARE_PROVIDER_SITE_OTHER): Payer: Medicare PPO

## 2023-09-29 ENCOUNTER — Other Ambulatory Visit: Payer: Self-pay

## 2023-09-29 DIAGNOSIS — E7800 Pure hypercholesterolemia, unspecified: Secondary | ICD-10-CM | POA: Diagnosis not present

## 2023-09-29 LAB — BASIC METABOLIC PANEL WITH GFR
BUN: 20 mg/dL (ref 6–23)
CO2: 25 meq/L (ref 19–32)
Calcium: 9.2 mg/dL (ref 8.4–10.5)
Chloride: 104 meq/L (ref 96–112)
Creatinine, Ser: 0.89 mg/dL (ref 0.40–1.50)
GFR: 81.61 mL/min (ref >60.00)
Glucose, Bld: 126 mg/dL — ABNORMAL HIGH (ref 70–99)
Potassium: 4.3 meq/L (ref 3.5–5.1)
Sodium: 141 meq/L (ref 135–145)

## 2023-09-30 ENCOUNTER — Other Ambulatory Visit: Payer: Medicare PPO

## 2023-09-30 ENCOUNTER — Ambulatory Visit: Payer: Self-pay | Admitting: Internal Medicine

## 2023-09-30 ENCOUNTER — Ambulatory Visit (INDEPENDENT_AMBULATORY_CARE_PROVIDER_SITE_OTHER)

## 2023-09-30 DIAGNOSIS — E7800 Pure hypercholesterolemia, unspecified: Secondary | ICD-10-CM

## 2023-09-30 LAB — LIPID PANEL
Cholesterol: 103 mg/dL (ref 0–200)
HDL: 30.8 mg/dL — ABNORMAL LOW (ref >39.00)
LDL Cholesterol: 33 mg/dL (ref 0–99)
NonHDL: 71.85
Total CHOL/HDL Ratio: 3
Triglycerides: 195 mg/dL — ABNORMAL HIGH (ref 0.0–149.0)
VLDL: 39 mg/dL (ref 0.0–40.0)

## 2023-09-30 LAB — HEPATIC FUNCTION PANEL
ALT: 15 U/L (ref 0–53)
AST: 17 U/L (ref 0–37)
Albumin: 4 g/dL (ref 3.5–5.2)
Alkaline Phosphatase: 47 U/L (ref 39–117)
Bilirubin, Direct: 0.1 mg/dL (ref 0.0–0.3)
Total Bilirubin: 0.6 mg/dL (ref 0.2–1.2)
Total Protein: 6.2 g/dL (ref 6.0–8.3)

## 2023-09-30 LAB — TSH: TSH: 0.71 u[IU]/mL (ref 0.35–5.50)

## 2023-10-02 ENCOUNTER — Ambulatory Visit: Payer: Self-pay | Admitting: Internal Medicine

## 2023-10-02 ENCOUNTER — Ambulatory Visit: Payer: Medicare PPO | Admitting: Internal Medicine

## 2023-10-02 ENCOUNTER — Encounter: Payer: Self-pay | Admitting: Internal Medicine

## 2023-10-02 VITALS — BP 130/62 | HR 76 | Ht 69.0 in | Wt 173.6 lb

## 2023-10-02 DIAGNOSIS — I429 Cardiomyopathy, unspecified: Secondary | ICD-10-CM

## 2023-10-02 DIAGNOSIS — I2510 Atherosclerotic heart disease of native coronary artery without angina pectoris: Secondary | ICD-10-CM | POA: Diagnosis not present

## 2023-10-02 DIAGNOSIS — K219 Gastro-esophageal reflux disease without esophagitis: Secondary | ICD-10-CM | POA: Diagnosis not present

## 2023-10-02 DIAGNOSIS — E1159 Type 2 diabetes mellitus with other circulatory complications: Secondary | ICD-10-CM | POA: Diagnosis not present

## 2023-10-02 DIAGNOSIS — M25562 Pain in left knee: Secondary | ICD-10-CM

## 2023-10-02 DIAGNOSIS — G4733 Obstructive sleep apnea (adult) (pediatric): Secondary | ICD-10-CM

## 2023-10-02 DIAGNOSIS — I700 Atherosclerosis of aorta: Secondary | ICD-10-CM | POA: Diagnosis not present

## 2023-10-02 DIAGNOSIS — Z0000 Encounter for general adult medical examination without abnormal findings: Secondary | ICD-10-CM | POA: Diagnosis not present

## 2023-10-02 DIAGNOSIS — F419 Anxiety disorder, unspecified: Secondary | ICD-10-CM | POA: Diagnosis not present

## 2023-10-02 DIAGNOSIS — I10 Essential (primary) hypertension: Secondary | ICD-10-CM

## 2023-10-02 DIAGNOSIS — E7800 Pure hypercholesterolemia, unspecified: Secondary | ICD-10-CM

## 2023-10-02 DIAGNOSIS — I480 Paroxysmal atrial fibrillation: Secondary | ICD-10-CM

## 2023-10-02 LAB — POCT GLYCOSYLATED HEMOGLOBIN (HGB A1C): Hemoglobin A1C: 6.1 % — AB (ref 4.0–5.6)

## 2023-10-02 MED ORDER — PIOGLITAZONE HCL 15 MG PO TABS: 15.0000 mg | ORAL_TABLET | Freq: Every day | ORAL | 1 refills | Status: DC

## 2023-10-02 MED ORDER — TRAZODONE HCL 50 MG PO TABS: 50.0000 mg | ORAL_TABLET | Freq: Every evening | ORAL | 1 refills | Status: DC | PRN

## 2023-10-02 MED ORDER — SERTRALINE HCL 100 MG PO TABS: 100.0000 mg | ORAL_TABLET | Freq: Every day | ORAL | 1 refills | Status: DC

## 2023-10-02 MED ORDER — MUPIROCIN 2 % EX OINT: 1.0000 | TOPICAL_OINTMENT | Freq: Two times a day (BID) | CUTANEOUS | 0 refills | Status: DC

## 2023-10-02 MED ORDER — CARVEDILOL 25 MG PO TABS: 25.0000 mg | ORAL_TABLET | Freq: Two times a day (BID) | ORAL | 1 refills | Status: DC

## 2023-10-02 NOTE — Assessment & Plan Note (Signed)
 Physical today 10/02/23.  Followed by urology for prostate checks.  Colonoscopy 07/27/23 - diverticulosis. No f/u recommended.

## 2023-10-02 NOTE — Progress Notes (Signed)
 Subjective:    Patient ID: Ricardo Nash, male    DOB: [birth year], 78 y.o.   MRN: [id number]  Patient here for  Chief Complaint  Patient presents with   Annual Exam    Physical     HPI Here for a physical exam. Saw cardiology 08/17/23 - f/u afib. Previous cardiac monitor - no afib. Has ILR in place, implanted 06/2023. No afib or flutter so far. Continues on coreg  and aspirin . Also f/u nonischemic cardiomyopathy EF 35 to 40%. etiology possible left bundle branch block. NYHA class II symptoms. Appears euvolemic. Continue spironolactone  25 mg daily, Jardiance  10 mg daily. Recently started on entresto . Off carvedilol  and losartan .  Blood pressure doing well. He is going to the gym 3-4x/week. Walking on treadmill, riding bike nad leg exercises. Is having some persistent left knee pain. Requet referral to ortho. Does report some decreased strength in his lower legs - can affect balance. Working in gym as outlined. Consider PT. Colonoscopy 07/27/23 - diverticulosis, otherwise normal. No f/u colonoscopy recommended. Saw pulmonary 06/22/23 - f/u OSA. Recommended another sleep study given weight loss. Repeat - apnea. New mask. Had f/u with urology 06/09/23 - continue testosterone  and flomax .    Past Medical History:  Diagnosis Date   Atrial fibrillation (HCC)    C. difficile colitis    CAD (coronary artery disease)    Chronic heart failure with mildly reduced ejection fraction (HFmrEF, 41-49%) (HCC)    Coronary artery disease    Depression    Diabetes mellitus (HCC)    Diverticulitis    Diverticulosis    Dysrhythmia    PAF   Environmental allergies    GERD (gastroesophageal reflux disease)    Goiter    intrathoracic, s/p benign biopsy (Dr Sofia Dunn)   Hypercholesterolemia    Hypertension    NICM (nonischemic cardiomyopathy) (HCC)    Osteoarthritis    cervical spine, lumbar spine   Paroxysmal atrial fibrillation (HCC)    Pneumonia    PONV (postoperative nausea and vomiting)    Sleep apnea     CPAP @ NIGHT   Stroke [facility])    TIA (transient ischemic attack)    Past Surgical History:  Procedure Laterality Date   BACK SURGERY  12/2006   s/p fusion of L4-L5   BACK SURGERY  2024   capsule endoscopy     CATARACT EXTRACTION  2011   Dr. Lasandra Points   CERVICAL DISC SURGERY  2002   CHOLECYSTECTOMY  2024   COLONOSCOPY WITH PROPOFOL  N/A 07/20/2017   Procedure: COLONOSCOPY WITH PROPOFOL ;  Surgeon: Cassie Click, MD;  Location: [facility] ENDOSCOPY;  Service: Endoscopy;  Laterality: N/A;   COLONOSCOPY WITH PROPOFOL  N/A 07/27/2023   Procedure: COLONOSCOPY WITH PROPOFOL ;  Surgeon: Shane Darling, MD;  Location: [facility] ENDOSCOPY;  Service: Endoscopy;  Laterality: N/A;   EYE SURGERY     cataract bilateral 10/1999   eyelid reduction     Dr. Fulton Job   gallbladder removed  12/2022   INGUINAL HERNIA REPAIR  1991   Dr, Felipe Horton   LEFT HEART CATH AND CORONARY ANGIOGRAPHY Left 06/02/2023   Procedure: LEFT HEART CATH AND CORONARY ANGIOGRAPHY;  Surgeon: Sammy Crisp, MD;  Location: [facility] INVASIVE CV LAB;  Service: Cardiovascular;  Laterality: Left;   LUMBAR LAMINECTOMY/DECOMPRESSION MICRODISCECTOMY N/A 10/15/2020   Procedure: Laminectomy - Lumbar Two-Lumbar Three - Lumbar Three-Lumbar Four with sublaminar decompression;  Surgeon: Isadora Mar, MD;  Location: [facility] OR;  Service: Neurosurgery;  Laterality: N/A;  Laminectomy - Lumbar Two-Lumbar  Three - Lumbar Three-Lumbar Four with sublaminar decompression   NOSE SURGERY     turbinate reduction   SEPTOPLASTY  1975   SHOULDER SURGERY  2000   rotator cuff   spinal stimulator     Family History  Problem Relation Age of Onset   Congestive Heart Failure Father    Heart disease Father        myocardial infarction   Rheumatic fever Father        valvular disease   Heart disease Mother        s/p CABG (age 29)   Kidney disease Sister    Colon cancer Neg Hx    Prostate cancer Neg Hx    Social History   Socioeconomic History   Marital status: Married     Spouse name: Not on file   Number of children: 2   Years of education: Not on file   Highest education level: Not on file  Occupational History   Occupation: retired Runner, broadcasting/film/video  Tobacco Use   Smoking status: Former    Passive exposure: Never   Smokeless tobacco: Never  Vaping Use   Vaping status: Never Used  Substance and Sexual Activity   Alcohol use: Yes    Alcohol/week: 0.0 standard drinks of alcohol    Comment: occasional   Drug use: No   Sexual activity: Not Currently  Other Topics Concern   Not on file  Social History Narrative   Not on file   Social Drivers of Health   Financial Resource Strain: Low Risk  (07/01/2023)   Overall Financial Resource Strain (CARDIA)    Difficulty of Paying Living Expenses: Not hard at all  Food Insecurity: No Food Insecurity (07/01/2023)   Hunger Vital Sign    Worried About Running Out of Food in the Last Year: Never true    Ran Out of Food in the Last Year: Never true  Transportation Needs: No Transportation Needs (07/01/2023)   PRAPARE - Administrator, Civil Service (Medical): No    Lack of Transportation (Non-Medical): No  Physical Activity: Inactive (07/01/2023)   Exercise Vital Sign    Days of Exercise per Week: 0 days    Minutes of Exercise per Session: 0 min  Stress: No Stress Concern Present (07/01/2023)   [employer] of Occupational Health - Occupational Stress Questionnaire    Feeling of Stress : Only a little  Social Connections: Socially Integrated (07/01/2023)   Social Connection and Isolation Panel [NHANES]    Frequency of Communication with Friends and Family: More than three times a week    Frequency of Social Gatherings with Friends and Family: Three times a week    Attends Religious Services: More than 4 times per year    Active Member of Clubs or Organizations: Yes    Attends Banker Meetings: More than 4 times per year    Marital Status: Married     Review of Systems   Constitutional:  Negative for appetite change and unexpected weight change.  HENT:  Negative for congestion, sinus pressure and sore throat.   Eyes:  Negative for pain and visual disturbance.  Respiratory:  Negative for cough, chest tightness and shortness of breath.   Cardiovascular:  Negative for chest pain, palpitations and leg swelling.  Gastrointestinal:  Negative for abdominal pain, diarrhea, nausea and vomiting.  Genitourinary:  Negative for difficulty urinating and dysuria.  Musculoskeletal:  Negative for myalgias.       Left knee pain.  Skin:  Negative for color change and rash.  Neurological:  Negative for dizziness and headaches.  Hematological:  Negative for adenopathy. Does not bruise/bleed easily.  Psychiatric/Behavioral:  Negative for agitation and dysphoric mood.        Objective:     BP 130/62   Pulse 76   Ht 5\' 9"  (1.753 m)   Wt 173 lb 9.6 oz (78.7 kg)   SpO2 96%   BMI 25.64 kg/m  Wt Readings from Last 3 Encounters:  10/02/23 173 lb 9.6 oz (78.7 kg)  08/17/23 177 lb 12.8 oz (80.6 kg)  07/27/23 176 lb 3.2 oz (79.9 kg)    Physical Exam Constitutional:      General: He is not in acute distress.    Appearance: Normal appearance. He is well-developed.  HENT:     Head: Normocephalic and atraumatic.     Right Ear: External ear normal.     Left Ear: External ear normal.     Mouth/Throat:     Pharynx: No oropharyngeal exudate or posterior oropharyngeal erythema.  Eyes:     General: No scleral icterus.       Right eye: No discharge.        Left eye: No discharge.     Conjunctiva/sclera: Conjunctivae normal.  Neck:     Thyroid : No thyromegaly.  Cardiovascular:     Rate and Rhythm: Normal rate and regular rhythm.  Pulmonary:     Effort: No respiratory distress.     Breath sounds: Normal breath sounds. No wheezing.  Abdominal:     General: Bowel sounds are normal.     Palpations: Abdomen is soft.     Tenderness: There is no abdominal tenderness.   Musculoskeletal:        General: No swelling or tenderness.     Cervical back: Neck supple. No tenderness.  Lymphadenopathy:     Cervical: No cervical adenopathy.  Skin:    Findings: No erythema or rash.  Neurological:     Mental Status: He is alert and oriented to person, place, and time.  Psychiatric:        Mood and Affect: Mood normal.        Behavior: Behavior normal.         Outpatient Encounter Medications as of 10/02/2023  Medication Sig   ACCU-CHEK GUIDE TEST test strip USE AS DIRECTED TO TEST BLOOD GLUCOSE LEVELS TWICE DAILY   ALPRAZolam  (XANAX ) 0.25 MG tablet Take 1 tablet (0.25 mg total) by mouth daily as needed.   aspirin  EC 81 MG tablet Take 81 mg by mouth every evening. Swallow whole.   atorvastatin  (LIPITOR) 40 MG tablet TAKE 1 TABLET BY MOUTH ONCE EVERY EVENING   azelastine  (OPTIVAR ) 0.05 % ophthalmic solution Apply to eye.   Azelastine -Fluticasone  137-50 MCG/ACT SUSP PLACE 1 SPRAY INTO EACH NOSTRIL ONCE EVERY MORNING AND AT BEDTIME   blood glucose meter kit and supplies Dispense based on patient and insurance preference. Use up to four times daily as directed. (FOR ICD-10 E10.9, E11.9).   BOOSTRIX 5-2.5-18.5 LF-MCG/0.5 injection Inject 0.5 mLs into the muscle once.   empagliflozin  (JARDIANCE ) 10 MG TABS tablet Take 1 tablet (10 mg total) by mouth daily before breakfast.   fluorometholone (FML) 0.1 % ophthalmic suspension SMARTSIG:In Eye(s)   levocetirizine (XYZAL ) 5 MG tablet TAKE 1 TABLET BY MOUTH ONCE EVERY EVENING   montelukast  (SINGULAIR ) 10 MG tablet Take 1 tablet (10 mg total) by mouth at bedtime.   Multiple Vitamin (MULTIVITAMIN WITH MINERALS) TABS  tablet Take 1 tablet by mouth in the morning.   mupirocin  ointment (BACTROBAN ) 2 % Apply 1 Application topically 2 (two) times daily.   ondansetron  (ZOFRAN ) 4 MG tablet Take 1 tablet (4 mg total) by mouth every 8 (eight) hours as needed for nausea or vomiting.   pantoprazole  (PROTONIX ) 40 MG tablet TAKE 1 TABLET  BY MOUTH ONCE DAILY   sacubitril-valsartan (ENTRESTO ) 49-51 MG Take 1 tablet by mouth 2 (two) times daily.   spironolactone  (ALDACTONE ) 25 MG tablet Take 1 tablet (25 mg total) by mouth daily.   tamsulosin  (FLOMAX ) 0.4 MG CAPS capsule Take 2 capsules (0.8 mg total) by mouth daily after supper.   testosterone  cypionate (DEPOTESTOSTERONE CYPIONATE) 200 MG/ML injection INJECT 0.5MLS INTRAMUSCULARLY EVERY 14 DAYS   TRULICITY  3 MG/0.5ML SOAJ INJECT 3MG  SUBCUTANEOUSLY ONCE A WEEK   [DISCONTINUED] carvedilol  (COREG ) 25 MG tablet TAKE 1 TABLET BY MOUTH TWICE DAILY WITH MEALS   [DISCONTINUED] metFORMIN  (GLUCOPHAGE ) 1000 MG tablet TAKE 1 TABLET BY MOUTH TWICE DAILY WITH MEALS   [DISCONTINUED] pioglitazone  (ACTOS ) 15 MG tablet TAKE 1 TABLET BY MOUTH ONCE DAILY   [DISCONTINUED] sertraline  (ZOLOFT ) 100 MG tablet TAKE 1 TABLET BY MOUTH ONCE DAILY   [DISCONTINUED] traZODone  (DESYREL ) 50 MG tablet Take 1 tablet (50 mg total) by mouth at bedtime as needed for sleep.   carvedilol  (COREG ) 25 MG tablet Take 1 tablet (25 mg total) by mouth 2 (two) times daily with a meal.   pioglitazone  (ACTOS ) 15 MG tablet Take 1 tablet (15 mg total) by mouth daily.   sertraline  (ZOLOFT ) 100 MG tablet Take 1 tablet (100 mg total) by mouth daily.   traZODone  (DESYREL ) 50 MG tablet Take 1 tablet (50 mg total) by mouth at bedtime as needed for sleep.   No facility-administered encounter medications on file as of 10/02/2023.     Lab Results  Component Value Date   WBC 8.8 05/26/2023   HGB 13.1 05/26/2023   HCT 40.4 05/26/2023   PLT 242 05/26/2023   GLUCOSE 126 (H) 09/29/2023   CHOL 103 09/30/2023   TRIG 195.0 (H) 09/30/2023   HDL 30.80 (L) 09/30/2023   LDLDIRECT 72.0 10/08/2022   LDLCALC 33 09/30/2023   ALT 15 09/30/2023   AST 17 09/30/2023   NA 141 09/29/2023   K 4.3 09/29/2023   CL 104 09/29/2023   CREATININE 0.89 09/29/2023   BUN 20 09/29/2023   CO2 25 09/29/2023   TSH 0.71 09/30/2023   PSA 0.82 06/10/2019   INR  0.9 10/11/2020   HGBA1C 6.1 (A) 10/02/2023   MICROALBUR 2.3 (H) 06/04/2023       Assessment & Plan:  Health care maintenance Assessment & Plan: Physical today 10/02/23.  Followed by urology for prostate checks.  Colonoscopy 07/27/23 - diverticulosis. No f/u recommended.    Type 2 diabetes mellitus with other circulatory complication, without long-term current use of insulin  [facility]) Assessment & Plan: Continue diet and exercise.  Follow met b and a1c. Low carb diet and exercise.  POC A1c today 6.1. no change in medication today.   Orders: -     POCT glycosylated hemoglobin (Hb A1C) -     Hemoglobin A1c; Future -     Basic metabolic panel with GFR; Future  Hypercholesterolemia Assessment & Plan: Continue lipitor.  Low cholesterol diet and exercise.  Follow lipid panel. Reviewed recent labs.  Lab Results  Component Value Date   CHOL 103 09/30/2023   HDL 30.80 (L) 09/30/2023   LDLCALC 33 09/30/2023  LDLDIRECT 72.0 10/08/2022   TRIG 195.0 (H) 09/30/2023   CHOLHDL 3 09/30/2023     Orders: -     Lipid panel; Future -     Basic metabolic panel with GFR; Future -     Hepatic function panel; Future  Anxiety Assessment & Plan: Continue zoloft . Stable. No changes in medication.    Aortic atherosclerosis (HCC) Assessment & Plan: Continue lipitor.   Coronary artery disease involving native coronary artery of native heart without angina pectoris Assessment & Plan: Continue aspirin , lipitor. Currently on entresto . No chest pain. Continue risk factor modification.    Cardiomyopathy, unspecified type [facility]) Assessment & Plan: Continues on entrestro. Per note also on spironolactone . Confirm. Breathing stable. Does not appear to be volume overloaded. Follow metabolic panel.    Gastroesophageal reflux disease, unspecified whether esophagitis present Assessment & Plan: No upper symptoms reported.  Conitnue protonix .     Primary hypertension Assessment & Plan: Continues on  entrestro. Per report, also taking spironolactone . Blood pressure doing well. Follow metabolic panel.    Paroxysmal atrial fibrillation (HCC) Assessment & Plan: Previously noted to be in afib when sick in hospital.  Prior to this most recent hospitalization he saw a new cardiologist.  She recommended starting eliquis.  He is hesitant to start.  Was told by cardiology previously - did not need.  Reports had isolated afib when sick in hospital.  He is on aspirin .  No increased heart rate or palpitations.  Breathing stable. Recent holter no afib. Has ILR. No afib to date. Continue aspirin .    Obstructive sleep apnea Assessment & Plan: Continue cpap.    Left knee pain, unspecified chronicity Assessment & Plan: Left knee pain as outlined.  Refer to ortho for further evaluation.   Orders: -     Ambulatory referral to Orthopedic Surgery  Other orders -     Carvedilol ; Take 1 tablet (25 mg total) by mouth 2 (two) times daily with a meal.  Dispense: 180 tablet; Refill: 1 -     Pioglitazone  HCl; Take 1 tablet (15 mg total) by mouth daily.  Dispense: 90 tablet; Refill: 1 -     Sertraline  HCl; Take 1 tablet (100 mg total) by mouth daily.  Dispense: 90 tablet; Refill: 1 -     traZODone  HCl; Take 1 tablet (50 mg total) by mouth at bedtime as needed for sleep.  Dispense: 90 tablet; Refill: 1 -     Mupirocin ; Apply 1 Application topically 2 (two) times daily.  Dispense: 22 g; Refill: 0     Dellar Fenton, MD

## 2023-10-07 ENCOUNTER — Other Ambulatory Visit: Payer: Self-pay | Admitting: Internal Medicine

## 2023-10-11 ENCOUNTER — Encounter: Payer: Self-pay | Admitting: Internal Medicine

## 2023-10-11 NOTE — Assessment & Plan Note (Signed)
 Continues on entrestro. Per report, also taking spironolactone . Blood pressure doing well. Follow metabolic panel.

## 2023-10-11 NOTE — Assessment & Plan Note (Signed)
 Continue cpap.

## 2023-10-11 NOTE — Assessment & Plan Note (Signed)
 Previously noted to be in afib when sick in hospital.  Prior to this most recent hospitalization he saw a new cardiologist.  She recommended starting eliquis.  He is hesitant to start.  Was told by cardiology previously - did not need.  Reports had isolated afib when sick in hospital.  He is on aspirin .  No increased heart rate or palpitations.  Breathing stable. Recent holter no afib. Has ILR. No afib to date. Continue aspirin .

## 2023-10-11 NOTE — Assessment & Plan Note (Signed)
 Continues on entrestro. Per note also on spironolactone . Confirm. Breathing stable. Does not appear to be volume overloaded. Follow metabolic panel.

## 2023-10-11 NOTE — Assessment & Plan Note (Signed)
 Continue aspirin , lipitor. Currently on entresto . No chest pain. Continue risk factor modification.

## 2023-10-11 NOTE — Assessment & Plan Note (Signed)
 Continue zoloft . Stable. No changes in medication.

## 2023-10-11 NOTE — Assessment & Plan Note (Signed)
 Continue lipitor  ?

## 2023-10-11 NOTE — Assessment & Plan Note (Signed)
 Continue diet and exercise.  Follow met b and a1c. Low carb diet and exercise.  POC A1c today 6.1. no change in medication today.

## 2023-10-11 NOTE — Assessment & Plan Note (Signed)
 Continue lipitor.  Low cholesterol diet and exercise.  Follow lipid panel. Reviewed recent labs.  Lab Results  Component Value Date   CHOL 103 09/30/2023   HDL 30.80 (L) 09/30/2023   LDLCALC 33 09/30/2023   LDLDIRECT 72.0 10/08/2022   TRIG 195.0 (H) 09/30/2023   CHOLHDL 3 09/30/2023

## 2023-10-11 NOTE — Assessment & Plan Note (Signed)
 No upper symptoms reported.  Conitnue protonix .

## 2023-10-11 NOTE — Assessment & Plan Note (Signed)
 Left knee pain as outlined.  Refer to ortho for further evaluation.

## 2023-10-13 ENCOUNTER — Encounter: Payer: Self-pay | Admitting: Internal Medicine

## 2023-10-13 ENCOUNTER — Other Ambulatory Visit: Payer: Self-pay | Admitting: Cardiology

## 2023-10-13 ENCOUNTER — Telehealth: Payer: Self-pay

## 2023-10-13 NOTE — Telephone Encounter (Signed)
 Copied from CRM [id number]. Topic: General - Other >> Oct 13, 2023  9:36 AM Ricardo Nash wrote: Reason for CRM: patient is needing a call back regarding his referral for orthopedic dr and he also is calling in regarding his diarrhea he has had since last thursday he has taken over the counter medications for the diarrhea but its working he as also been eating yogurt and nothing is working

## 2023-10-14 ENCOUNTER — Ambulatory Visit: Admitting: Internal Medicine

## 2023-10-14 VITALS — BP 136/54 | HR 86 | Temp 98.6°F | Ht 69.0 in | Wt 174.0 lb

## 2023-10-14 DIAGNOSIS — I10 Essential (primary) hypertension: Secondary | ICD-10-CM

## 2023-10-14 DIAGNOSIS — K529 Noninfective gastroenteritis and colitis, unspecified: Secondary | ICD-10-CM | POA: Diagnosis not present

## 2023-10-14 NOTE — Telephone Encounter (Signed)
 LM to schedule patient for 2pm today with Dr Narendra. Appt slot on hold. Please schedule when he returns call.

## 2023-10-14 NOTE — Telephone Encounter (Signed)
 Pt scheduled

## 2023-10-14 NOTE — Progress Notes (Signed)
 Acute Office Visit  Subjective:     Patient ID: Ricardo Nash, male    DOB: 04-Sep-1945, 78 y.o.   MRN: [id number]  Chief Complaint  Patient presents with   Diarrhea    X 1 week.  Had been out of town.  Has tried pepto bismol and kaopectate.  Has had 3 bowel movements today, totally liquid.  No complaints of pain.  Poor appetite.     Diarrhea  Pertinent negatives include no abdominal pain or vomiting.   Patient is in today for diarrhea for the last 5 to 6 days.  Patient states that he recently traveled out of town and when he returned he developed symptoms of diarrhea.  No nausea or vomiting.  No fevers.  Patient states the diarrhea was watery and brown.  He tried Pepto-Bismol which turned his stool darker (dark brown) and Kaopectate without any improvement in symptoms.  He is able to tolerate oral intake and denies any lightheadedness or dizziness.  Patient states that he had approximately 6 episodes of diarrhea on Friday Saturday and Sunday but symptoms have slowly been improving since then.  Review of Systems  Constitutional: Negative.   Respiratory: Negative.    Cardiovascular: Negative.   Gastrointestinal:  Positive for diarrhea. Negative for abdominal pain, blood in stool, melena, nausea and vomiting.  Musculoskeletal: Negative.   Neurological: Negative.  Negative for dizziness and weakness.  Psychiatric/Behavioral: Negative.          Objective:    BP (!) 136/54   Pulse 86   Temp 98.6 F (37 C) (Oral)   Ht 5\' 9"  (1.753 m)   Wt 174 lb (78.9 kg)   SpO2 97%   BMI 25.70 kg/m    Physical Exam Constitutional:      Appearance: Normal appearance.  HENT:     Head: Normocephalic and atraumatic.  Cardiovascular:     Rate and Rhythm: Normal rate and regular rhythm.     Heart sounds: Normal heart sounds.  Pulmonary:     Breath sounds: Normal breath sounds. No wheezing or rales.  Abdominal:     General: Bowel sounds are normal. There is no distension.      Palpations: Abdomen is soft.     Tenderness: There is no abdominal tenderness. There is no guarding or rebound.     Comments: Small midline abdominal hernia noted on exam, easily reducible and patient is asymptomatic  Neurological:     General: No focal deficit present.     Mental Status: He is alert and oriented to person, place, and time.  Psychiatric:        Mood and Affect: Mood normal.        Behavior: Behavior normal.     No results found for any visits on 10/14/23.      Assessment & Plan:   Problem List Items Addressed This Visit       Cardiovascular and Mediastinum   Benign essential hypertension - Primary   - This problem is chronic and stable -Patient is on carvedilol  25 mg twice daily as well as Entresto  49/51 mg twice daily and spironolactone  25 mg daily -Blood pressure today is 136/54 -Patient has had diarrhea for the last 5 to 6 days but denies any symptoms of lightheadedness or dizziness  -Will continue with medications at current dose for now        Digestive   Acute gastroenteritis   - Patient states that approximately 5 to 6 days ago he developed  diarrhea after recently traveling out of town -Patient states that diarrhea was watery but denies any melena or hematochezia -No relief with Pepto-Bismol or Kaopectate -Patient states that his symptoms are slowly improving and is tolerating oral intake and keeping himself hydrated -Blood pressure stable and he denies any dizziness -On exam, abdomen was soft, nontender with no rebound or guarding and normoactive bowel sounds -I suspect he likely has an acute gastroenteritis most likely viral -No indication for antibiotic therapy - Symptoms appear to be slowly improving.  Will continue with conservative management -Return precautions given to the patient       No orders of the defined types were placed in this encounter.   No follow-ups on file.  Krizia Flight, MD

## 2023-10-14 NOTE — Assessment & Plan Note (Signed)
-   Patient states that approximately 5 to 6 days ago he developed diarrhea after recently traveling out of town -Patient states that diarrhea was watery but denies any melena or hematochezia -No relief with Pepto-Bismol or Kaopectate -Patient states that his symptoms are slowly improving and is tolerating oral intake and keeping himself hydrated -Blood pressure stable and he denies any dizziness -On exam, abdomen was soft, nontender with no rebound or guarding and normoactive bowel sounds -I suspect he likely has an acute gastroenteritis most likely viral -No indication for antibiotic therapy - Symptoms appear to be slowly improving.  Will continue with conservative management -Return precautions given to the patient

## 2023-10-14 NOTE — Telephone Encounter (Signed)
 Patient called back and I scheduled an appointment for him to see Dr. Sharia Daunt at 2pm today.

## 2023-10-14 NOTE — Assessment & Plan Note (Signed)
-   This problem is chronic and stable -Patient is on carvedilol  25 mg twice daily as well as Entresto  49/51 mg twice daily and spironolactone  25 mg daily -Blood pressure today is 136/54 -Patient has had diarrhea for the last 5 to 6 days but denies any symptoms of lightheadedness or dizziness  -Will continue with medications at current dose for now

## 2023-10-14 NOTE — Patient Instructions (Signed)
-   It was a pleasure meeting you today -I suspect that your symptoms are likely secondary to an acute gastroenteritis.  These are usually viral in nature and since your symptoms are slowly improving I suspect that they will resolve by this weekend -Continue to hydrate yourself and eat as tolerated -Your blood pressure is doing well today indicating that you are maintaining adequate hydration -If your symptoms do not improve by this weekend please contact us  for further evaluation

## 2023-10-14 NOTE — Telephone Encounter (Signed)
 Pt scheduled for acute visit

## 2023-10-19 ENCOUNTER — Ambulatory Visit (INDEPENDENT_AMBULATORY_CARE_PROVIDER_SITE_OTHER)

## 2023-10-19 DIAGNOSIS — I428 Other cardiomyopathies: Secondary | ICD-10-CM | POA: Diagnosis not present

## 2023-10-19 LAB — CUP PACEART REMOTE DEVICE CHECK
Date Time Interrogation Session: 20250609010000
Implantable Pulse Generator Implant Date: 20250225
Pulse Gen Serial Number: 135133

## 2023-10-22 NOTE — Addendum Note (Signed)
 Addended by: Lott Rouleau A on: 10/22/2023 09:57 AM   Modules accepted: Orders

## 2023-10-22 NOTE — Progress Notes (Signed)
 [facility] Summary Report / Loop Recorder

## 2023-10-25 ENCOUNTER — Ambulatory Visit: Payer: Self-pay | Admitting: Cardiology

## 2023-11-02 ENCOUNTER — Ambulatory Visit: Attending: Cardiology | Admitting: Cardiology

## 2023-11-02 ENCOUNTER — Encounter: Payer: Self-pay | Admitting: Cardiology

## 2023-11-02 VITALS — BP 127/60 | HR 90 | Wt 176.2 lb

## 2023-11-02 DIAGNOSIS — I5022 Chronic systolic (congestive) heart failure: Secondary | ICD-10-CM | POA: Insufficient documentation

## 2023-11-02 DIAGNOSIS — I4891 Unspecified atrial fibrillation: Secondary | ICD-10-CM | POA: Diagnosis not present

## 2023-11-02 DIAGNOSIS — I447 Left bundle-branch block, unspecified: Secondary | ICD-10-CM | POA: Diagnosis not present

## 2023-11-02 DIAGNOSIS — I5020 Unspecified systolic (congestive) heart failure: Secondary | ICD-10-CM | POA: Diagnosis not present

## 2023-11-02 DIAGNOSIS — E119 Type 2 diabetes mellitus without complications: Secondary | ICD-10-CM | POA: Diagnosis not present

## 2023-11-02 DIAGNOSIS — I2510 Atherosclerotic heart disease of native coronary artery without angina pectoris: Secondary | ICD-10-CM

## 2023-11-02 DIAGNOSIS — I110 Hypertensive heart disease with heart failure: Secondary | ICD-10-CM | POA: Insufficient documentation

## 2023-11-02 DIAGNOSIS — I428 Other cardiomyopathies: Secondary | ICD-10-CM | POA: Diagnosis not present

## 2023-11-02 DIAGNOSIS — Z79899 Other long term (current) drug therapy: Secondary | ICD-10-CM | POA: Diagnosis not present

## 2023-11-02 NOTE — Patient Instructions (Addendum)
 Medication Changes:  No medication changes.   Special Instructions // Education:  Your provider has ordered an MRI. Someone should be calling you within the next week or two regarding getting this scheduled.   Follow-Up in: 6 months with Dr. Zenaida.  Our Doctors' schedules are NOT open yet for 6 months. We will place you on our recall list. Once they are available, we will call you to schedule your follow up appointment.   At the [facility], you and your health needs are our priority. We have a designated team specialized in the treatment of Heart Failure. This Care Team includes your primary Heart Failure Specialized Cardiologist (physician), Advanced Practice Providers (APPs- Physician Assistants and Nurse Practitioners), and Pharmacist who all work together to provide you with the care you need, when you need it.   You may see any of the following providers on your designated Care Team at your next follow up:  Dr. Toribio Fuel Dr. Ezra Shuck Dr. Ria Commander Dr. Odis Zenaida Ellouise Class, FNP Jaun Bash, RPH-CPP  Please be sure to bring in all your medications bottles to every appointment.   Need to Contact Us :  If you have any questions or concerns before your next appointment please send us  a message through [place] or call our office at [phone].    TO LEAVE A MESSAGE FOR THE NURSE SELECT OPTION 2, PLEASE LEAVE A MESSAGE INCLUDING: YOUR NAME DATE OF BIRTH CALL BACK NUMBER REASON FOR CALL**this is important as we prioritize the call backs  YOU WILL RECEIVE A CALL BACK THE SAME DAY AS LONG AS YOU CALL BEFORE 4:00 PM

## 2023-11-03 DIAGNOSIS — M1712 Unilateral primary osteoarthritis, left knee: Secondary | ICD-10-CM | POA: Diagnosis not present

## 2023-11-03 DIAGNOSIS — M25562 Pain in left knee: Secondary | ICD-10-CM | POA: Diagnosis not present

## 2023-11-03 DIAGNOSIS — M25552 Pain in left hip: Secondary | ICD-10-CM | POA: Diagnosis not present

## 2023-11-03 DIAGNOSIS — M1612 Unilateral primary osteoarthritis, left hip: Secondary | ICD-10-CM | POA: Diagnosis not present

## 2023-11-04 ENCOUNTER — Encounter: Payer: Self-pay | Admitting: Internal Medicine

## 2023-11-05 NOTE — Progress Notes (Signed)
 ADVANCED HEART FAILURE FOLLOW UP CLINIC NOTE  Referring Physician: Glendia Shad, MD  Primary Care: Glendia Shad, MD Primary Cardiologist:  HPI: Ricardo Nash is a 77 y.o. male with a PMH of  h/o 1 episode of atrial fibrillation in 2003, hypertension, diabetes, LBBB, chronic systolic heart failure  who presents for follow up of chronic systolic heart failure.      Echo 06/2021 EF 40 to 45% reported.  Septal bounce, consistent with bundle branch block. Exercise MPI 12/2017 no evidence for ischemia  Echo 05/20/2023 with LVEF 35-40%, bundle branch block, moderate LVH, normal RV function  First diagnosed with atrial fibrillation in 2002 after recovering from neck surgery.  To his knowledge, has not had any episodes since.  This appears to have been a lone postoperative episode.  He was doing really well up until last July (of 2024) when he underwent back surgery and quickly thereafter had to have gallbladder surgery.  Before this he was walking 2 miles per day at the [facility].      SUBJECTIVE: No change in his symptoms, patient remains NYHA Class I without any symptoms of volume or exertional shortness of breath. He denies any recent or exertional chest pain and is able to complete his iADLs without any issue. We spent awhile discussing his reduced EF. He does have a LBBB, any given that altervantive etiologies for his HFrEF have not been determined could derive benefit from CRT. However, he has no symptoms and is overall doing well.  PMH, current medications, allergies, social history, and family history reviewed in epic.  PHYSICAL EXAM: Vitals:   11/02/23 1355  BP: 127/60  Pulse: 90  SpO2: 98%   GENERAL: NAD, well appearing PULM:  Normal work of breathing, CTAB CARDIAC:  JVP: flat         Normal rate with regular rhythm. No murmurs, rubs or gallops.  No edema. Warm and well perfused extremities. ABDOMEN: Soft, non-tender, non-distended. NEUROLOGIC: Patient is oriented x3 with  no focal or lateralizing neurologic deficits.    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 Long risk benefit discussion, given QRS <150, NYHA Class I, and patient preference, will hold on consideration of CRT at this time. He is on well optimized medical therapy and does not have significant room for titration. We discussed that CMR may be beneficial as significant LGE or high risk features may push us  towards device. -Continue entresto  49/51mg  BID - Continue carvedilol  25mg  BID -Continue farxiga  10mg , spironolactone  25mg  daily -CMR ordered - Follow up in 6 months -Given lack of symptoms, benefit of CRT is questionable  Atrial fibrillation: Not on anticoagulation, loop recorder in place.  - Continue follow up with EP  Hypertension:  - Well controlled in clinic today, continue  management as above  Follow up in 6 months  Morene Brownie, MD Advanced Heart Failure Mechanical Circulatory Support 11/05/23

## 2023-11-19 ENCOUNTER — Ambulatory Visit

## 2023-11-19 DIAGNOSIS — I428 Other cardiomyopathies: Secondary | ICD-10-CM

## 2023-11-19 LAB — CUP PACEART REMOTE DEVICE CHECK
Date Time Interrogation Session: 20250710010300
Implantable Pulse Generator Implant Date: 20250225
Pulse Gen Serial Number: 135133

## 2023-11-28 ENCOUNTER — Other Ambulatory Visit: Payer: Self-pay | Admitting: Cardiology

## 2023-11-28 ENCOUNTER — Other Ambulatory Visit: Payer: Self-pay | Admitting: Internal Medicine

## 2023-11-29 ENCOUNTER — Ambulatory Visit: Payer: Self-pay | Admitting: Cardiology

## 2023-12-02 NOTE — Progress Notes (Signed)
 Bsx Loop Recorder

## 2023-12-13 ENCOUNTER — Encounter: Payer: Self-pay | Admitting: Internal Medicine

## 2023-12-13 DIAGNOSIS — R29898 Other symptoms and signs involving the musculoskeletal system: Secondary | ICD-10-CM

## 2023-12-15 NOTE — Telephone Encounter (Signed)
 Order placed for PT referral per pt request.

## 2023-12-17 ENCOUNTER — Encounter: Payer: Self-pay | Admitting: Cardiology

## 2023-12-17 ENCOUNTER — Ambulatory Visit: Attending: Cardiology | Admitting: Cardiology

## 2023-12-17 VITALS — BP 100/58 | HR 86 | Ht 69.0 in | Wt 171.2 lb

## 2023-12-17 DIAGNOSIS — G4733 Obstructive sleep apnea (adult) (pediatric): Secondary | ICD-10-CM | POA: Diagnosis not present

## 2023-12-17 DIAGNOSIS — I428 Other cardiomyopathies: Secondary | ICD-10-CM | POA: Diagnosis not present

## 2023-12-17 DIAGNOSIS — I2510 Atherosclerotic heart disease of native coronary artery without angina pectoris: Secondary | ICD-10-CM | POA: Diagnosis not present

## 2023-12-17 DIAGNOSIS — I10 Essential (primary) hypertension: Secondary | ICD-10-CM | POA: Diagnosis not present

## 2023-12-17 DIAGNOSIS — I4891 Unspecified atrial fibrillation: Secondary | ICD-10-CM | POA: Diagnosis not present

## 2023-12-17 MED ORDER — SACUBITRIL-VALSARTAN 49-51 MG PO TABS
1.0000 | ORAL_TABLET | Freq: Two times a day (BID) | ORAL | 3 refills | Status: AC
Start: 2023-12-17 — End: ?

## 2023-12-17 MED ORDER — EMPAGLIFLOZIN 10 MG PO TABS: 10.0000 mg | ORAL_TABLET | Freq: Every day | ORAL | 3 refills | Status: AC

## 2023-12-17 NOTE — Progress Notes (Signed)
 Cardiology Office Note:    Date:  12/17/2023   ID:  Tallan, Sandoz May 15, 1945, MRN [id number]  PCP:  Glendia Shad, MD   [facility] Providers Cardiologist:  Redell Cave, MD Electrophysiologist:  Fonda Kitty, MD     Referring MD: Glendia Shad, MD   No chief complaint on file.   History of Present Illness:    Ricardo Nash is a 78 y.o. male with a hx of Nonobstructive CAD ( LHC 05/2023- 55% mid LAD, 45% proximal Lcx), NICM EF 35%, hypertension, lone atrial fibrillation s/p ILR 2/25, diabetes who presents for follow-up.  Repeat echo 2 to 3 months ago showed EF 30 to 35%.  Losartan  was switched to Entresto .  Patient tolerating medications as prescribed with no adverse effect.  Followed up with advanced heart failure clinic, CMR scheduled in about 3 to 4 months.   Prior notes/testing Echo 05/2023 EF 35 to 40%. LHC 1/25, 50% mid LAD, 45% proximal LCx. Echo 06/2021 EF 40 to 45% reported.  Reviewed by myself roughly 50%.  Septal bounce, consistent with bundle branch block. Exercise MPI 12/2017 no evidence for ischemia.  Past Medical History:  Diagnosis Date   Atrial fibrillation (HCC)    C. difficile colitis    CAD (coronary artery disease)    Chronic heart failure with mildly reduced ejection fraction (HFmrEF, 41-49%) (HCC)    Coronary artery disease    Depression    Diabetes mellitus (HCC)    Diverticulitis    Diverticulosis    Dysrhythmia    PAF   Environmental allergies    GERD (gastroesophageal reflux disease)    Goiter    intrathoracic, s/p benign biopsy (Dr Eletha)   Hypercholesterolemia    Hypertension    NICM (nonischemic cardiomyopathy) (HCC)    Osteoarthritis    cervical spine, lumbar spine   Paroxysmal atrial fibrillation (HCC)    Pneumonia    PONV (postoperative nausea and vomiting)    Sleep apnea    CPAP @ NIGHT   Stroke [facility])    TIA (transient ischemic attack)     Past Surgical History:  Procedure Laterality Date    BACK SURGERY  12/2006   s/p fusion of L4-L5   BACK SURGERY  2024   capsule endoscopy     CATARACT EXTRACTION  2011   Dr. Cleatus   CERVICAL DISC SURGERY  2002   CHOLECYSTECTOMY  2024   COLONOSCOPY WITH PROPOFOL  N/A 07/20/2017   Procedure: COLONOSCOPY WITH PROPOFOL ;  Surgeon: Viktoria Lamar DASEN, MD;  Location: [facility] ENDOSCOPY;  Service: Endoscopy;  Laterality: N/A;   COLONOSCOPY WITH PROPOFOL  N/A 07/27/2023   Procedure: COLONOSCOPY WITH PROPOFOL ;  Surgeon: Maryruth Ole DASEN, MD;  Location: [facility] ENDOSCOPY;  Service: Endoscopy;  Laterality: N/A;   EYE SURGERY     cataract bilateral 10/1999   eyelid reduction     Dr. Gretta   gallbladder removed  12/2022   INGUINAL HERNIA REPAIR  1991   Dr, Claudene   LEFT HEART CATH AND CORONARY ANGIOGRAPHY Left 06/02/2023   Procedure: LEFT HEART CATH AND CORONARY ANGIOGRAPHY;  Surgeon: Mady Bruckner, MD;  Location: [facility] INVASIVE CV LAB;  Service: Cardiovascular;  Laterality: Left;   LUMBAR LAMINECTOMY/DECOMPRESSION MICRODISCECTOMY N/A 10/15/2020   Procedure: Laminectomy - Lumbar Two-Lumbar Three - Lumbar Three-Lumbar Four with sublaminar decompression;  Surgeon: Joshua Alm RAMAN, MD;  Location: [facility] OR;  Service: Neurosurgery;  Laterality: N/A;  Laminectomy - Lumbar Two-Lumbar Three - Lumbar Three-Lumbar Four with sublaminar decompression   NOSE  SURGERY     turbinate reduction   SEPTOPLASTY  1975   SHOULDER SURGERY  2000   rotator cuff   spinal stimulator      Current Medications: Current Meds  Medication Sig   ACCU-CHEK GUIDE TEST test strip USE AS DIRECTED TO TEST BLOOD GLUCOSE LEVELS TWICE DAILY   ALPRAZolam  (XANAX ) 0.25 MG tablet Take 1 tablet (0.25 mg total) by mouth daily as needed.   aspirin  EC 81 MG tablet Take 81 mg by mouth every evening. Swallow whole.   atorvastatin  (LIPITOR) 40 MG tablet TAKE 1 TABLET BY MOUTH ONCE EVERY EVENING   azelastine  (OPTIVAR ) 0.05 % ophthalmic solution Apply to eye.   Azelastine -Fluticasone  137-50 MCG/ACT SUSP PLACE  1 SPRAY INTO EACH NOSTRIL ONCE EVERY MORNING AND AT BEDTIME   blood glucose meter kit and supplies Dispense based on patient and insurance preference. Use up to four times daily as directed. (FOR ICD-10 E10.9, E11.9).   BOOSTRIX 5-2.5-18.5 LF-MCG/0.5 injection Inject 0.5 mLs into the muscle once.   carvedilol  (COREG ) 25 MG tablet Take 1 tablet (25 mg total) by mouth 2 (two) times daily with a meal.   fluorometholone (FML) 0.1 % ophthalmic suspension SMARTSIG:In Eye(s)   levocetirizine (XYZAL ) 5 MG tablet TAKE 1 TABLET BY MOUTH ONCE EVERY EVENING   metFORMIN  (GLUCOPHAGE ) 1000 MG tablet TAKE 1 TABLET BY MOUTH TWICE DAILY WITH MEALS   montelukast  (SINGULAIR ) 10 MG tablet Take 1 tablet (10 mg total) by mouth at bedtime.   Multiple Vitamin (MULTIVITAMIN WITH MINERALS) TABS tablet Take 1 tablet by mouth in the morning.   mupirocin  ointment (BACTROBAN ) 2 % Apply 1 Application topically 2 (two) times daily.   ondansetron  (ZOFRAN ) 4 MG tablet Take 1 tablet (4 mg total) by mouth every 8 (eight) hours as needed for nausea or vomiting.   pantoprazole  (PROTONIX ) 40 MG tablet TAKE 1 TABLET BY MOUTH ONCE DAILY   pioglitazone  (ACTOS ) 15 MG tablet Take 1 tablet (15 mg total) by mouth daily.   sertraline  (ZOLOFT ) 100 MG tablet Take 1 tablet (100 mg total) by mouth daily.   spironolactone  (ALDACTONE ) 25 MG tablet Take 1 tablet (25 mg total) by mouth daily.   tamsulosin  (FLOMAX ) 0.4 MG CAPS capsule Take 2 capsules (0.8 mg total) by mouth daily after supper.   testosterone  cypionate (DEPOTESTOSTERONE CYPIONATE) 200 MG/ML injection INJECT 0.5MLS INTRAMUSCULARLY EVERY 14 DAYS   traZODone  (DESYREL ) 50 MG tablet Take 1 tablet (50 mg total) by mouth at bedtime as needed for sleep.   TRULICITY  3 MG/0.5ML SOAJ INJECT 3MG  SUBCUTANEOUSLY ONCE A WEEK   [DISCONTINUED] empagliflozin  (JARDIANCE ) 10 MG TABS tablet Take 1 tablet (10 mg total) by mouth daily before breakfast.   [DISCONTINUED] sacubitril -valsartan  (ENTRESTO ) 49-51 MG  TAKE 1 TABLET BY MOUTH TWICE DAILY     Allergies:   Penicillins and Penicillin g   Social History   Socioeconomic History   Marital status: Married    Spouse name: Not on file   Number of children: 2   Years of education: Not on file   Highest education level: Not on file  Occupational History   Occupation: retired Runner, broadcasting/film/video  Tobacco Use   Smoking status: Former    Passive exposure: Never   Smokeless tobacco: Never  Vaping Use   Vaping status: Never Used  Substance and Sexual Activity   Alcohol use: Yes    Alcohol/week: 0.0 standard drinks of alcohol    Comment: occasional   Drug use: No   Sexual activity: Not Currently  Other Topics Concern   Not on file  Social History Narrative   Not on file   Social Drivers of Health   Financial Resource Strain: Low Risk  (11/03/2023)   Received from [facility]   Overall Financial Resource Strain (CARDIA)    Difficulty of Paying Living Expenses: Not hard at all  Food Insecurity: No Food Insecurity (11/03/2023)   Received from [facility]   Hunger Vital Sign    Within the past 12 months, you worried that your food would run out before you got the money to buy more.: Never true    Within the past 12 months, the food you bought just didn't last and you didn't have money to get more.: Never true  Transportation Needs: No Transportation Needs (11/03/2023)   Received from [facility] - Transportation    In the past 12 months, has lack of transportation kept you from medical appointments or from getting medications?: No    Lack of Transportation (Non-Medical): No  Physical Activity: Inactive (07/01/2023)   Exercise Vital Sign    Days of Exercise per Week: 0 days    Minutes of Exercise per Session: 0 min  Stress: No Stress Concern Present (07/01/2023)   [employer] of Occupational Health - Occupational Stress Questionnaire    Feeling of Stress : Only a little  Social  Connections: Socially Integrated (07/01/2023)   Social Connection and Isolation Panel    Frequency of Communication with Friends and Family: More than three times a week    Frequency of Social Gatherings with Friends and Family: Three times a week    Attends Religious Services: More than 4 times per year    Active Member of Clubs or Organizations: Yes    Attends Engineer, structural: More than 4 times per year    Marital Status: Married     Family History: The patient's family history includes Congestive Heart Failure in his father; Heart disease in his father and mother; Kidney disease in his sister; Rheumatic fever in his father. There is no history of Colon cancer or Prostate cancer.  ROS:   Please see the history of present illness.     All other systems reviewed and are negative.  EKGs/Labs/Other Studies Reviewed:    The following studies were reviewed today:       Recent Labs: 05/26/2023: Hemoglobin 13.1; Platelets 242 09/29/2023: BUN 20; Creatinine, Ser 0.89; Potassium 4.3; Sodium 141 09/30/2023: ALT 15; TSH 0.71  Recent Lipid Panel    Component Value Date/Time   CHOL 103 09/30/2023 0821   CHOL 117 05/26/2023 1620   TRIG 195.0 (H) 09/30/2023 0821   HDL 30.80 (L) 09/30/2023 0821   HDL 37 (L) 05/26/2023 1620   CHOLHDL 3 09/30/2023 0821   VLDL 39.0 09/30/2023 0821   LDLCALC 33 09/30/2023 0821   LDLCALC 49 05/26/2023 1620   LDLDIRECT 72.0 10/08/2022 0745     Risk Assessment/Calculations:             Physical Exam:    VS:  BP (!) 100/58 (BP Location: Left Arm, Patient Position: Sitting, Cuff Size: Normal)   Pulse 86   Ht 5' 9 (1.753 m)   Wt 171 lb 3.2 oz (77.7 kg)   SpO2 97%   BMI 25.28 kg/m     Wt Readings from Last 3 Encounters:  12/17/23 171 lb 3.2 oz (77.7 kg)  11/02/23 176 lb 4 oz (79.9 kg)  10/14/23  174 lb (78.9 kg)     GEN:  Well nourished, well developed in no acute distress HEENT: Normal NECK: No JVD; No carotid bruits CARDIAC: RRR,  no murmurs, rubs, gallops RESPIRATORY:  Clear to auscultation without rales, wheezing or rhonchi  ABDOMEN: Soft, non-tender, non-distended MUSCULOSKELETAL:  No edema; No deformity  SKIN: Warm and dry NEUROLOGIC:  Alert and oriented x 3 PSYCHIATRIC:  Normal affect   ASSESSMENT:    1. Lone atrial fibrillation (HCC)   2. NICM (nonischemic cardiomyopathy) (HCC)   3. Coronary artery disease involving native coronary artery of native heart, unspecified whether angina present   4. Primary hypertension     PLAN:    In order of problems listed above:  Atrial fibrillation, diagnosed in 2002.  Likely lone A-fib.  Has ILR in place, implanted 06/2023 no A-fib or flutter so far.  Continue Coreg , aspirin  81 mg daily. Nonischemic cardiomyopathy EF 35%.  Recently started on Entresto .  Etiology possible left bundle branch block.  NYHA class II symptoms.  Appears euvolemic.  BP low normal.  Continue Coreg  25 mg twice daily, Entresto  49-51 mg twice daily, spironolactone  25 mg daily, Jardiance  10 mg daily.  CMR scheduled in 4 to 5 months.  Appreciate input from heart failure service. Nonobstructive CAD (55% mid LAD, 45% proximal Lcx), denies chest pain.  Continue aspirin  81 mg daily, Lipitor 40 mg daily. Hypertension, BP controlled.  Continue Coreg  25 mg twice daily, Maxzide , lisinopril .   Follow-up in 6 months      Medication Adjustments/Labs and Tests Ordered: Current medicines are reviewed at length with the patient today.  Concerns regarding medicines are outlined above.  No orders of the defined types were placed in this encounter.  Meds ordered this encounter  Medications   sacubitril -valsartan  (ENTRESTO ) 49-51 MG    Sig: Take 1 tablet by mouth 2 (two) times daily.    Dispense:  180 tablet    Refill:  3   empagliflozin  (JARDIANCE ) 10 MG TABS tablet    Sig: Take 1 tablet (10 mg total) by mouth daily before breakfast.    Dispense:  90 tablet    Refill:  3    Switching from Farxiga  to check  cost of drug    Patient Instructions  Medication Instructions:  Your physician recommends that you continue on your current medications as directed. Please refer to the Current Medication list given to you today.   *If you need a refill on your cardiac medications before your next appointment, please call your pharmacy*  Lab Work: No labs ordered today  If you have labs (blood work) drawn today and your tests are completely normal, you will receive your results only by: MyChart Message (if you have MyChart) OR A paper copy in the mail If you have any lab test that is abnormal or we need to change your treatment, we will call you to review the results.  Testing/Procedures: No test ordered today   Follow-Up: At [facility], you and your health needs are our priority.  As part of our continuing mission to provide you with exceptional heart care, our providers are all part of one team.  This team includes your primary Cardiologist (physician) and Advanced Practice Providers or APPs (Physician Assistants and Nurse Practitioners) who all work together to provide you with the care you need, when you need it.  Your next appointment:   6 month(s)  Provider:   You may see Redell Cave, MD or one of the following  Advanced Practice Providers on your designated Care Team:   Lonni Meager, NP Lesley Maffucci, PA-C Bernardino Bring, PA-C Cadence La Yuca, PA-C Tylene Lunch, NP Barnie Hila, NP    We recommend signing up for the patient portal called MyChart.  Sign up information is provided on this After Visit Summary.  MyChart is used to connect with patients for Virtual Visits (Telemedicine).  Patients are able to view lab/test results, encounter notes, upcoming appointments, etc.  Non-urgent messages can be sent to your provider as well.   To learn more about what you can do with MyChart, go to [email or web address].         Signed, Redell Cave, MD  12/17/2023 9:49 AM     [facility]

## 2023-12-17 NOTE — Patient Instructions (Signed)
 Medication Instructions:  Your physician recommends that you continue on your current medications as directed. Please refer to the Current Medication list given to you today.   *If you need a refill on your cardiac medications before your next appointment, please call your pharmacy*  Lab Work: No labs ordered today  If you have labs (blood work) drawn today and your tests are completely normal, you will receive your results only by: MyChart Message (if you have MyChart) OR A paper copy in the mail If you have any lab test that is abnormal or we need to change your treatment, we will call you to review the results.  Testing/Procedures: No test ordered today   Follow-Up: At [facility], you and your health needs are our priority.  As part of our continuing mission to provide you with exceptional heart care, our providers are all part of one team.  This team includes your primary Cardiologist (physician) and Advanced Practice Providers or APPs (Physician Assistants and Nurse Practitioners) who all work together to provide you with the care you need, when you need it.  Your next appointment:   6 month(s)  Provider:   You may see Constancia Delton, MD or one of the following Advanced Practice Providers on your designated Care Team:   Laneta Pintos, NP Gildardo Labrador, PA-C Varney Gentleman, PA-C Cadence Kettering, PA-C Ronald Cockayne, NP Morey Ar, NP    We recommend signing up for the patient portal called "MyChart".  Sign up information is provided on this After Visit Summary.  MyChart is used to connect with patients for Virtual Visits (Telemedicine).  Patients are able to view lab/test results, encounter notes, upcoming appointments, etc.  Non-urgent messages can be sent to your provider as well.   To learn more about what you can do with MyChart, go to [email or web address].

## 2023-12-18 DIAGNOSIS — M5451 Vertebrogenic low back pain: Secondary | ICD-10-CM | POA: Diagnosis not present

## 2023-12-18 DIAGNOSIS — R262 Difficulty in walking, not elsewhere classified: Secondary | ICD-10-CM | POA: Diagnosis not present

## 2023-12-18 DIAGNOSIS — R531 Weakness: Secondary | ICD-10-CM | POA: Diagnosis not present

## 2023-12-21 ENCOUNTER — Ambulatory Visit

## 2023-12-21 DIAGNOSIS — I480 Paroxysmal atrial fibrillation: Secondary | ICD-10-CM | POA: Diagnosis not present

## 2023-12-22 DIAGNOSIS — R531 Weakness: Secondary | ICD-10-CM | POA: Diagnosis not present

## 2023-12-22 DIAGNOSIS — M5451 Vertebrogenic low back pain: Secondary | ICD-10-CM | POA: Diagnosis not present

## 2023-12-22 DIAGNOSIS — R262 Difficulty in walking, not elsewhere classified: Secondary | ICD-10-CM | POA: Diagnosis not present

## 2023-12-22 LAB — CUP PACEART REMOTE DEVICE CHECK
Date Time Interrogation Session: 20250811030600
Implantable Pulse Generator Implant Date: 20250225
Pulse Gen Serial Number: 135133

## 2023-12-23 ENCOUNTER — Ambulatory Visit: Payer: Self-pay | Admitting: Cardiology

## 2023-12-24 DIAGNOSIS — M5451 Vertebrogenic low back pain: Secondary | ICD-10-CM | POA: Diagnosis not present

## 2023-12-24 DIAGNOSIS — R531 Weakness: Secondary | ICD-10-CM | POA: Diagnosis not present

## 2023-12-24 DIAGNOSIS — R262 Difficulty in walking, not elsewhere classified: Secondary | ICD-10-CM | POA: Diagnosis not present

## 2023-12-29 DIAGNOSIS — R262 Difficulty in walking, not elsewhere classified: Secondary | ICD-10-CM | POA: Diagnosis not present

## 2023-12-29 DIAGNOSIS — M5451 Vertebrogenic low back pain: Secondary | ICD-10-CM | POA: Diagnosis not present

## 2023-12-29 DIAGNOSIS — R531 Weakness: Secondary | ICD-10-CM | POA: Diagnosis not present

## 2023-12-31 DIAGNOSIS — M5451 Vertebrogenic low back pain: Secondary | ICD-10-CM | POA: Diagnosis not present

## 2023-12-31 DIAGNOSIS — R531 Weakness: Secondary | ICD-10-CM | POA: Diagnosis not present

## 2023-12-31 DIAGNOSIS — R262 Difficulty in walking, not elsewhere classified: Secondary | ICD-10-CM | POA: Diagnosis not present

## 2024-01-01 ENCOUNTER — Ambulatory Visit: Admitting: General Practice

## 2024-01-01 ENCOUNTER — Ambulatory Visit: Payer: Self-pay | Admitting: General Practice

## 2024-01-01 ENCOUNTER — Encounter: Payer: Self-pay | Admitting: General Practice

## 2024-01-01 ENCOUNTER — Ambulatory Visit (INDEPENDENT_AMBULATORY_CARE_PROVIDER_SITE_OTHER)
Admission: RE | Admit: 2024-01-01 | Discharge: 2024-01-01 | Disposition: A | Discharge status: Home / Self Care | Source: Ambulatory Visit | Attending: General Practice | Admitting: General Practice

## 2024-01-01 VITALS — BP 138/60 | HR 86 | Temp 98.0°F | Ht 69.0 in | Wt 173.0 lb

## 2024-01-01 DIAGNOSIS — M160 Bilateral primary osteoarthritis of hip: Secondary | ICD-10-CM | POA: Diagnosis not present

## 2024-01-01 DIAGNOSIS — M25551 Pain in right hip: Secondary | ICD-10-CM

## 2024-01-01 DIAGNOSIS — M545 Low back pain, unspecified: Secondary | ICD-10-CM | POA: Diagnosis not present

## 2024-01-01 MED ORDER — METHOCARBAMOL 500 MG PO TABS: 500.0000 mg | ORAL_TABLET | Freq: Three times a day (TID) | ORAL | 0 refills | Status: DC | PRN
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
## 2024-01-01 NOTE — Patient Instructions (Signed)
 Complete xray(s) prior to leaving today. I will notify you of your results once received.  Start Robaxin  500 mg every 8 hours as needed for pain. As discussed, it can cause sedation precautions.   Schedule follow up within 1-2 weeks with Dr. Glendia or ortho.   It was a pleasure meeting you!

## 2024-01-03 ENCOUNTER — Encounter: Payer: Self-pay | Admitting: Internal Medicine

## 2024-01-04 NOTE — Telephone Encounter (Signed)
 FYI   Called to follow up with patient. Ortho is seeing him tomorrow.

## 2024-01-04 NOTE — Telephone Encounter (Signed)
 Agree with need for evaluation given increased pain and inability to get up, etc. Keep us  posted and let me know if anything more needed.

## 2024-01-05 DIAGNOSIS — M1611 Unilateral primary osteoarthritis, right hip: Secondary | ICD-10-CM | POA: Diagnosis not present

## 2024-01-06 ENCOUNTER — Other Ambulatory Visit: Payer: Self-pay | Admitting: Internal Medicine

## 2024-01-07 DIAGNOSIS — R262 Difficulty in walking, not elsewhere classified: Secondary | ICD-10-CM | POA: Diagnosis not present

## 2024-01-07 DIAGNOSIS — M5451 Vertebrogenic low back pain: Secondary | ICD-10-CM | POA: Diagnosis not present

## 2024-01-07 DIAGNOSIS — R531 Weakness: Secondary | ICD-10-CM | POA: Diagnosis not present

## 2024-01-12 ENCOUNTER — Ambulatory Visit: Payer: Self-pay | Admitting: Internal Medicine

## 2024-01-12 DIAGNOSIS — M5451 Vertebrogenic low back pain: Secondary | ICD-10-CM | POA: Diagnosis not present

## 2024-01-12 DIAGNOSIS — R262 Difficulty in walking, not elsewhere classified: Secondary | ICD-10-CM | POA: Diagnosis not present

## 2024-01-12 DIAGNOSIS — R531 Weakness: Secondary | ICD-10-CM | POA: Diagnosis not present

## 2024-01-13 DIAGNOSIS — D225 Melanocytic nevi of trunk: Secondary | ICD-10-CM | POA: Diagnosis not present

## 2024-01-13 DIAGNOSIS — D2262 Melanocytic nevi of left upper limb, including shoulder: Secondary | ICD-10-CM | POA: Diagnosis not present

## 2024-01-13 DIAGNOSIS — L821 Other seborrheic keratosis: Secondary | ICD-10-CM | POA: Diagnosis not present

## 2024-01-13 DIAGNOSIS — Z85828 Personal history of other malignant neoplasm of skin: Secondary | ICD-10-CM | POA: Diagnosis not present

## 2024-01-13 DIAGNOSIS — D2261 Melanocytic nevi of right upper limb, including shoulder: Secondary | ICD-10-CM | POA: Diagnosis not present

## 2024-01-14 DIAGNOSIS — R262 Difficulty in walking, not elsewhere classified: Secondary | ICD-10-CM | POA: Diagnosis not present

## 2024-01-14 DIAGNOSIS — R531 Weakness: Secondary | ICD-10-CM | POA: Diagnosis not present

## 2024-01-14 DIAGNOSIS — M5451 Vertebrogenic low back pain: Secondary | ICD-10-CM | POA: Diagnosis not present

## 2024-01-19 DIAGNOSIS — R262 Difficulty in walking, not elsewhere classified: Secondary | ICD-10-CM | POA: Diagnosis not present

## 2024-01-19 DIAGNOSIS — M5451 Vertebrogenic low back pain: Secondary | ICD-10-CM | POA: Diagnosis not present

## 2024-01-19 DIAGNOSIS — R531 Weakness: Secondary | ICD-10-CM | POA: Diagnosis not present

## 2024-01-21 ENCOUNTER — Other Ambulatory Visit: Payer: Self-pay

## 2024-01-21 ENCOUNTER — Ambulatory Visit: Payer: Self-pay | Admitting: Urology

## 2024-01-21 ENCOUNTER — Other Ambulatory Visit
Admission: RE | Admit: 2024-01-21 | Discharge: 2024-01-21 | Disposition: A | Discharge status: Home / Self Care | Attending: Urology | Admitting: Urology

## 2024-01-21 ENCOUNTER — Ambulatory Visit: Admitting: Urology

## 2024-01-21 ENCOUNTER — Ambulatory Visit (INDEPENDENT_AMBULATORY_CARE_PROVIDER_SITE_OTHER)

## 2024-01-21 VITALS — BP 149/68 | HR 81 | Wt 173.0 lb

## 2024-01-21 DIAGNOSIS — M5451 Vertebrogenic low back pain: Secondary | ICD-10-CM | POA: Diagnosis not present

## 2024-01-21 DIAGNOSIS — I480 Paroxysmal atrial fibrillation: Secondary | ICD-10-CM | POA: Diagnosis not present

## 2024-01-21 DIAGNOSIS — R32 Unspecified urinary incontinence: Secondary | ICD-10-CM | POA: Diagnosis not present

## 2024-01-21 DIAGNOSIS — N138 Other obstructive and reflux uropathy: Secondary | ICD-10-CM | POA: Diagnosis not present

## 2024-01-21 DIAGNOSIS — N401 Enlarged prostate with lower urinary tract symptoms: Secondary | ICD-10-CM | POA: Diagnosis not present

## 2024-01-21 DIAGNOSIS — R399 Unspecified symptoms and signs involving the genitourinary system: Secondary | ICD-10-CM | POA: Diagnosis not present

## 2024-01-21 DIAGNOSIS — R262 Difficulty in walking, not elsewhere classified: Secondary | ICD-10-CM | POA: Diagnosis not present

## 2024-01-21 DIAGNOSIS — R531 Weakness: Secondary | ICD-10-CM | POA: Diagnosis not present

## 2024-01-21 LAB — URINALYSIS, COMPLETE (UACMP) WITH MICROSCOPIC
Bilirubin Urine: NEGATIVE
Glucose, UA: >=500 mg/dL — AB
Hgb urine dipstick: NEGATIVE
Ketones, ur: NEGATIVE mg/dL
Leukocytes,Ua: NEGATIVE
Nitrite: NEGATIVE
Protein, ur: NEGATIVE mg/dL
Specific Gravity, Urine: 1.015 (ref 1.005–1.030)
pH: 5.5 (ref 5.0–8.0)

## 2024-01-21 LAB — CUP PACEART REMOTE DEVICE CHECK
Date Time Interrogation Session: 20250911001000
Implantable Pulse Generator Implant Date: 20250225
Pulse Gen Serial Number: 135133

## 2024-01-21 LAB — BLADDER SCAN AMB NON-IMAGING

## 2024-01-21 MED ORDER — FLUCONAZOLE 100 MG PO TABS: 200.0000 mg | ORAL_TABLET | Freq: Every day | ORAL | 0 refills | Status: DC

## 2024-01-21 NOTE — Progress Notes (Addendum)
   01/21/2024 9:38 AM   Helayne Ruth Allman 06/27/1945 [id number]  Reason for visit: Worsening urinary symptoms, history of low testosterone , ED, BPH/OAB  History: Previously followed at [facility], establish care with [facility] urology in 2022 Number of comorbidities including sleep apnea, diabetes, hypertension, depression Hypogonadism on injections Failed PDE 5 inhibitors and injections for ED, not interested in further treatments On Flomax  0.8 mg nightly for BPH  Physical Exam: BP (!) 149/68 (BP Location: Left Arm, Patient Position: Sitting, Cuff Size: Normal)   Pulse 81   Wt 173 lb (78.5 kg)   SpO2 97%   BMI 25.55 kg/m    Imaging/labs: PSA 0.9 and stable from October 2023 Renal function normal May 2025 eGGFR >60 I personally viewed and interpreted the CT from 2019 showed a 60 g prostate Urinalysis today glucosuria, otherwise benign, yeast were present  Today: He reports about 6 weeks of worsening urinary symptoms with frequency and urgency, he is wearing a depends for some leakage PVR today again mildly elevated at Denies any dysuria or gross hematuria  Plan:   Worsening urinary symptoms over the last 6 weeks with frequency, and worsening incontinence, PVR remains mildly elevated.  We discussed possible etiologies including yeast UTI, glucosuria, or BPH.  I recommended a trial of fluconazole  and continue to work on diabetes, with close follow-up for cystoscopy and TRUS to consider outlet procedures.  Risk and benefits were reviewed.  Fluconazole  200 mg daily x 7 days Change Flomax  to 0.4 mg twice daily as his symptoms are worse in the morning RTC for cystoscopy/TRUS, can get HOLEP symptoms resolved with antibiotics or diet changes   Redell JAYSON Burnet, MD  [facility] [address] [place], [place] [address] [phone]

## 2024-01-26 DIAGNOSIS — M5451 Vertebrogenic low back pain: Secondary | ICD-10-CM | POA: Diagnosis not present

## 2024-01-26 DIAGNOSIS — R531 Weakness: Secondary | ICD-10-CM | POA: Diagnosis not present

## 2024-01-26 DIAGNOSIS — R262 Difficulty in walking, not elsewhere classified: Secondary | ICD-10-CM | POA: Diagnosis not present

## 2024-01-28 DIAGNOSIS — M5451 Vertebrogenic low back pain: Secondary | ICD-10-CM | POA: Diagnosis not present

## 2024-01-28 DIAGNOSIS — R531 Weakness: Secondary | ICD-10-CM | POA: Diagnosis not present

## 2024-01-28 DIAGNOSIS — R262 Difficulty in walking, not elsewhere classified: Secondary | ICD-10-CM | POA: Diagnosis not present

## 2024-01-28 NOTE — Progress Notes (Signed)
 Remote Loop Recorder Transmission

## 2024-01-29 ENCOUNTER — Other Ambulatory Visit (INDEPENDENT_AMBULATORY_CARE_PROVIDER_SITE_OTHER)

## 2024-01-29 DIAGNOSIS — E7800 Pure hypercholesterolemia, unspecified: Secondary | ICD-10-CM

## 2024-01-29 DIAGNOSIS — E1159 Type 2 diabetes mellitus with other circulatory complications: Secondary | ICD-10-CM | POA: Diagnosis not present

## 2024-01-29 LAB — BASIC METABOLIC PANEL WITH GFR
BUN: 20 mg/dL (ref 6–23)
CO2: 28 meq/L (ref 19–32)
Calcium: 9.4 mg/dL (ref 8.4–10.5)
Chloride: 101 meq/L (ref 96–112)
Creatinine, Ser: 0.76 mg/dL (ref 0.40–1.50)
GFR: 85.39 mL/min (ref >60.00)
Glucose, Bld: 117 mg/dL — ABNORMAL HIGH (ref 70–99)
Potassium: 4.7 meq/L (ref 3.5–5.1)
Sodium: 140 meq/L (ref 135–145)

## 2024-01-29 LAB — LIPID PANEL
Cholesterol: 93 mg/dL (ref 0–200)
HDL: 25.1 mg/dL — ABNORMAL LOW (ref >39.00)
LDL Cholesterol: 38 mg/dL (ref 0–99)
NonHDL: 67.46
Total CHOL/HDL Ratio: 4
Triglycerides: 147 mg/dL (ref 0.0–149.0)
VLDL: 29.4 mg/dL (ref 0.0–40.0)

## 2024-01-29 LAB — HEPATIC FUNCTION PANEL
ALT: 14 U/L (ref 0–53)
AST: 15 U/L (ref 0–37)
Albumin: 3.8 g/dL (ref 3.5–5.2)
Alkaline Phosphatase: 65 U/L (ref 39–117)
Bilirubin, Direct: 0.2 mg/dL (ref 0.0–0.3)
Total Bilirubin: 0.7 mg/dL (ref 0.2–1.2)
Total Protein: 6.3 g/dL (ref 6.0–8.3)

## 2024-01-29 LAB — HEMOGLOBIN A1C: Hgb A1c MFr Bld: 7.2 % — ABNORMAL HIGH (ref 4.6–6.5)

## 2024-02-01 ENCOUNTER — Ambulatory Visit: Payer: Self-pay | Admitting: Cardiology

## 2024-02-01 DIAGNOSIS — G4733 Obstructive sleep apnea (adult) (pediatric): Secondary | ICD-10-CM | POA: Diagnosis not present

## 2024-02-02 DIAGNOSIS — R531 Weakness: Secondary | ICD-10-CM | POA: Diagnosis not present

## 2024-02-02 DIAGNOSIS — M5451 Vertebrogenic low back pain: Secondary | ICD-10-CM | POA: Diagnosis not present

## 2024-02-02 DIAGNOSIS — R262 Difficulty in walking, not elsewhere classified: Secondary | ICD-10-CM | POA: Diagnosis not present

## 2024-02-03 ENCOUNTER — Ambulatory Visit: Admitting: Internal Medicine

## 2024-02-03 ENCOUNTER — Encounter: Payer: Self-pay | Admitting: Internal Medicine

## 2024-02-03 VITALS — BP 130/70 | HR 80 | Resp 16 | Ht 69.0 in | Wt 173.4 lb

## 2024-02-03 DIAGNOSIS — R350 Frequency of micturition: Secondary | ICD-10-CM | POA: Diagnosis not present

## 2024-02-03 DIAGNOSIS — I10 Essential (primary) hypertension: Secondary | ICD-10-CM | POA: Diagnosis not present

## 2024-02-03 DIAGNOSIS — M2550 Pain in unspecified joint: Secondary | ICD-10-CM

## 2024-02-03 DIAGNOSIS — I480 Paroxysmal atrial fibrillation: Secondary | ICD-10-CM | POA: Diagnosis not present

## 2024-02-03 DIAGNOSIS — Z23 Encounter for immunization: Secondary | ICD-10-CM

## 2024-02-03 DIAGNOSIS — G4733 Obstructive sleep apnea (adult) (pediatric): Secondary | ICD-10-CM

## 2024-02-03 DIAGNOSIS — E7800 Pure hypercholesterolemia, unspecified: Secondary | ICD-10-CM

## 2024-02-03 DIAGNOSIS — E041 Nontoxic single thyroid nodule: Secondary | ICD-10-CM

## 2024-02-03 DIAGNOSIS — M25512 Pain in left shoulder: Secondary | ICD-10-CM

## 2024-02-03 DIAGNOSIS — E1159 Type 2 diabetes mellitus with other circulatory complications: Secondary | ICD-10-CM

## 2024-02-03 DIAGNOSIS — I429 Cardiomyopathy, unspecified: Secondary | ICD-10-CM

## 2024-02-03 DIAGNOSIS — I2510 Atherosclerotic heart disease of native coronary artery without angina pectoris: Secondary | ICD-10-CM

## 2024-02-03 DIAGNOSIS — F419 Anxiety disorder, unspecified: Secondary | ICD-10-CM | POA: Diagnosis not present

## 2024-02-03 DIAGNOSIS — I700 Atherosclerosis of aorta: Secondary | ICD-10-CM

## 2024-02-03 DIAGNOSIS — M25511 Pain in right shoulder: Secondary | ICD-10-CM | POA: Diagnosis not present

## 2024-02-03 DIAGNOSIS — D509 Iron deficiency anemia, unspecified: Secondary | ICD-10-CM

## 2024-02-03 LAB — CK: Total CK: 47 U/L (ref 17–232)

## 2024-02-03 LAB — SEDIMENTATION RATE: Sed Rate: 53 mm/h — ABNORMAL HIGH (ref 0–20)

## 2024-02-03 MED ORDER — GABAPENTIN 100 MG PO CAPS: 100.0000 mg | ORAL_CAPSULE | Freq: Three times a day (TID) | ORAL | 1 refills | Status: DC

## 2024-02-03 MED ORDER — TRIAMCINOLONE ACETONIDE 0.1 % EX CREA
1.0000 | TOPICAL_CREAM | Freq: Two times a day (BID) | CUTANEOUS | 0 refills | Status: DC
Start: 2024-02-03 — End: 2024-03-07

## 2024-02-03 NOTE — Progress Notes (Unsigned)
 Subjective:    Patient ID: Ricardo Nash, male    DOB: 1945-12-02, 78 y.o.   MRN: [id number]  Patient here for  Chief Complaint  Patient presents with   Medical Management of Chronic Issues    HPI Here for a scheduled follow up. Saw cardiology 12/17/23- f/u afib. Previous cardiac monitor - no afib. Has ILR in place, implanted 06/2023. No afib or flutter so far. Continues on coreg  and aspirin . Also f/u nonischemic cardiomyopathy EF 35 to 40%. etiology possible left bundle branch block. NYHA class II symptoms. Appears euvolemic. Continue spironolactone  25 mg daily, Jardiance  10 mg daily and entresto . Off losartan . Saw pulmonary 06/22/23 - f/u OSA. Recommended another sleep study given weight loss. Repeat - apnea. New mask. Had f/u with urology 06/09/23 - continue testosterone  and flomax  saw ortho 11/03/23 - left knee/left hip pain. S/p cortisone injection - hip. Also evaluated 01/05/24- right hip pain. S/u ultrasound guided intra-articular hip joint injection. Had f/u with urology 01/21/24 - worsening urinary symptoms with increased frequency and worsening incontinence. Recommended fluconazole  200mg  and change flomax  to .4mg  bid. Urinary frequency has improved. Completed diflucan . Main concern today is bilateral shoulder pain and upper arm pain. Also reports bilateral wrist pain and upper thigh pain.  He is unable to stand without using his arms to push up. No chest pain or sob reported. No abdominal pain reported. Reviewed outside sugar readings. Overall controlled.     Past Medical History:  Diagnosis Date   Atrial fibrillation (HCC)    C. difficile colitis    CAD (coronary artery disease)    Chronic heart failure with mildly reduced ejection fraction (HFmrEF, 41-49%) (HCC)    Coronary artery disease    Depression    Diabetes mellitus (HCC)    Diverticulitis    Diverticulosis    Dysrhythmia    PAF   Environmental allergies    GERD (gastroesophageal reflux disease)    Goiter     intrathoracic, s/p benign biopsy (Dr Eletha)   Hypercholesterolemia    Hypertension    NICM (nonischemic cardiomyopathy) (HCC)    Osteoarthritis    cervical spine, lumbar spine   Paroxysmal atrial fibrillation (HCC)    Pneumonia    PONV (postoperative nausea and vomiting)    Sleep apnea    CPAP @ NIGHT   Stroke [facility])    TIA (transient ischemic attack)    Past Surgical History:  Procedure Laterality Date   BACK SURGERY  12/2006   s/p fusion of L4-L5   BACK SURGERY  2024   capsule endoscopy     CATARACT EXTRACTION  2011   Dr. Cleatus   CERVICAL DISC SURGERY  2002   CHOLECYSTECTOMY  2024   COLONOSCOPY WITH PROPOFOL  N/A 07/20/2017   Procedure: COLONOSCOPY WITH PROPOFOL ;  Surgeon: Viktoria Lamar DASEN, MD;  Location: [facility] ENDOSCOPY;  Service: Endoscopy;  Laterality: N/A;   COLONOSCOPY WITH PROPOFOL  N/A 07/27/2023   Procedure: COLONOSCOPY WITH PROPOFOL ;  Surgeon: Maryruth Ole DASEN, MD;  Location: [facility] ENDOSCOPY;  Service: Endoscopy;  Laterality: N/A;   EYE SURGERY     cataract bilateral 10/1999   eyelid reduction     Dr. Gretta   gallbladder removed  12/2022   INGUINAL HERNIA REPAIR  1991   Dr, Claudene   LEFT HEART CATH AND CORONARY ANGIOGRAPHY Left 06/02/2023   Procedure: LEFT HEART CATH AND CORONARY ANGIOGRAPHY;  Surgeon: Mady Bruckner, MD;  Location: [facility] INVASIVE CV LAB;  Service: Cardiovascular;  Laterality: Left;   LUMBAR LAMINECTOMY/DECOMPRESSION  MICRODISCECTOMY N/A 10/15/2020   Procedure: Laminectomy - Lumbar Two-Lumbar Three - Lumbar Three-Lumbar Four with sublaminar decompression;  Surgeon: Joshua Alm RAMAN, MD;  Location: [facility] OR;  Service: Neurosurgery;  Laterality: N/A;  Laminectomy - Lumbar Two-Lumbar Three - Lumbar Three-Lumbar Four with sublaminar decompression   NOSE SURGERY     turbinate reduction   SEPTOPLASTY  1975   SHOULDER SURGERY  2000   rotator cuff   spinal stimulator     Family History  Problem Relation Age of Onset   Congestive Heart Failure Father     Heart disease Father        myocardial infarction   Rheumatic fever Father        valvular disease   Heart disease Mother        s/p CABG (age 73)   Kidney disease Sister    Colon cancer Neg Hx    Prostate cancer Neg Hx    Social History   Socioeconomic History   Marital status: Married    Spouse name: Not on file   Number of children: 2   Years of education: Not on file   Highest education level: Not on file  Occupational History   Occupation: retired Runner, broadcasting/film/video  Tobacco Use   Smoking status: Former    Passive exposure: Never   Smokeless tobacco: Never  Vaping Use   Vaping status: Never Used  Substance and Sexual Activity   Alcohol use: Yes    Alcohol/week: 0.0 standard drinks of alcohol    Comment: occasional   Drug use: No   Sexual activity: Not Currently  Other Topics Concern   Not on file  Social History Narrative   Not on file   Social Drivers of Health   Financial Resource Strain: Low Risk  (01/05/2024)   [facility]   Overall Financial Resource Strain (CARDIA)    Difficulty of Paying Living Expenses: Not hard at all  Food Insecurity: No Food Insecurity (01/05/2024)   Received from [facility]   Hunger Vital Sign    Within the past 12 months, you worried that your food would run out before you got the money to buy more.: Never true    Within the past 12 months, the food you bought just didn't last and you didn't have money to get more.: Never true  Transportation Needs: No Transportation Needs (01/05/2024)   Received from [facility] - Transportation    In the past 12 months, has lack of transportation kept you from medical appointments or from getting medications?: No    Lack of Transportation (Non-Medical): No  Physical Activity: Inactive (07/01/2023)   Exercise Vital Sign    Days of Exercise per Week: 0 days    Minutes of Exercise per Session: 0 min  Stress: No Stress Concern  Present (07/01/2023)   [employer] of Occupational Health - Occupational Stress Questionnaire    Feeling of Stress : Only a little  Social Connections: Socially Integrated (07/01/2023)   Social Connection and Isolation Panel    Frequency of Communication with Friends and Family: More than three times a week    Frequency of Social Gatherings with Friends and Family: Three times a week    Attends Religious Services: More than 4 times per year    Active Member of Clubs or Organizations: Yes    Attends Banker Meetings: More than 4 times per year    Marital Status: Married  Review of Systems     Objective:     BP 130/70   Pulse 80   Resp 16   Ht 5' 9 (1.753 m)   Wt 173 lb 6.4 oz (78.7 kg)   SpO2 98%   BMI 25.61 kg/m  Wt Readings from Last 3 Encounters:  02/03/24 173 lb 6.4 oz (78.7 kg)  01/21/24 173 lb (78.5 kg)  01/01/24 173 lb (78.5 kg)    Physical Exam  {Perform Simple Foot Exam  Perform Detailed exam:1} {Insert foot Exam (Optional):30965}   Outpatient Encounter Medications as of 02/03/2024  Medication Sig   ACCU-CHEK GUIDE TEST test strip USE AS DIRECTED TO TEST BLOOD GLUCOSE LEVELS TWICE DAILY   ALPRAZolam  (XANAX ) 0.25 MG tablet Take 1 tablet (0.25 mg total) by mouth daily as needed.   aspirin  EC 81 MG tablet Take 81 mg by mouth every evening. Swallow whole.   atorvastatin  (LIPITOR) 40 MG tablet TAKE 1 TABLET BY MOUTH ONCE EVERY EVENING   azelastine  (OPTIVAR ) 0.05 % ophthalmic solution Apply to eye.   Azelastine -Fluticasone  137-50 MCG/ACT SUSP PLACE 1 SPRAY INTO EACH NOSTRIL ONCE EVERY MORNING AND AT BEDTIME   blood glucose meter kit and supplies Dispense based on patient and insurance preference. Use up to four times daily as directed. (FOR ICD-10 E10.9, E11.9).   BOOSTRIX 5-2.5-18.5 LF-MCG/0.5 injection Inject 0.5 mLs into the muscle once.   carvedilol  (COREG ) 25 MG tablet Take 1 tablet (25 mg total) by mouth 2 (two) times daily with a meal.    empagliflozin  (JARDIANCE ) 10 MG TABS tablet Take 1 tablet (10 mg total) by mouth daily before breakfast.   fluconazole  (DIFLUCAN ) 100 MG tablet Take 2 tablets (200 mg total) by mouth daily. X 7 days   fluorometholone (FML) 0.1 % ophthalmic suspension SMARTSIG:In Eye(s)   levocetirizine (XYZAL ) 5 MG tablet TAKE 1 TABLET BY MOUTH ONCE EVERY EVENING   metFORMIN  (GLUCOPHAGE ) 1000 MG tablet TAKE 1 TABLET BY MOUTH TWICE DAILY WITH MEALS   methocarbamol  (ROBAXIN ) 500 MG tablet Take 1 tablet (500 mg total) by mouth every 8 (eight) hours as needed for muscle spasms.   montelukast  (SINGULAIR ) 10 MG tablet Take 1 tablet (10 mg total) by mouth at bedtime.   Multiple Vitamin (MULTIVITAMIN WITH MINERALS) TABS tablet Take 1 tablet by mouth in the morning.   ondansetron  (ZOFRAN ) 4 MG tablet Take 1 tablet (4 mg total) by mouth every 8 (eight) hours as needed for nausea or vomiting.   pantoprazole  (PROTONIX ) 40 MG tablet TAKE 1 TABLET BY MOUTH ONCE DAILY   pioglitazone  (ACTOS ) 15 MG tablet Take 1 tablet (15 mg total) by mouth daily.   sacubitril -valsartan  (ENTRESTO ) 49-51 MG Take 1 tablet by mouth 2 (two) times daily.   sertraline  (ZOLOFT ) 100 MG tablet Take 1 tablet (100 mg total) by mouth daily.   tamsulosin  (FLOMAX ) 0.4 MG CAPS capsule Take 2 capsules (0.8 mg total) by mouth daily after supper.   testosterone  cypionate (DEPOTESTOSTERONE CYPIONATE) 200 MG/ML injection INJECT 0.5MLS INTRAMUSCULARLY EVERY 14 DAYS   traZODone  (DESYREL ) 50 MG tablet Take 1 tablet (50 mg total) by mouth at bedtime as needed for sleep.   TRULICITY  3 MG/0.5ML SOAJ INJECT 3MG  SUBCUTANEOUSLY ONCE A WEEK   No facility-administered encounter medications on file as of 02/03/2024.     Lab Results  Component Value Date   WBC 8.8 05/26/2023   HGB 13.1 05/26/2023   HCT 40.4 05/26/2023   PLT 242 05/26/2023   GLUCOSE 117 (H) 01/29/2024  CHOL 93 01/29/2024   TRIG 147.0 01/29/2024   HDL 25.10 (L) 01/29/2024   LDLDIRECT 72.0 10/08/2022    LDLCALC 38 01/29/2024   ALT 14 01/29/2024   AST 15 01/29/2024   NA 140 01/29/2024   K 4.7 01/29/2024   CL 101 01/29/2024   CREATININE 0.76 01/29/2024   BUN 20 01/29/2024   CO2 28 01/29/2024   TSH 0.71 09/30/2023   PSA 0.82 06/10/2019   INR 0.9 10/11/2020   HGBA1C 7.2 (H) 01/29/2024    CUP PACEART REMOTE DEVICE CHECK Result Date: 01/21/2024 ILR summary report received. Battery status OK. Normal device function. No new symptom, tachy, brady, or pause episodes.  new AF episodes, EGMs c/w false detection due to SR with PAC ectopy. Monthly summary reports and ROV/PRN. MC, CVRS      Assessment & Plan:  Hypercholesterolemia  Type 2 diabetes mellitus with other circulatory complication, without long-term current use of insulin  (HCC)     Allena Hamilton, MD

## 2024-02-04 ENCOUNTER — Encounter: Payer: Self-pay | Admitting: Internal Medicine

## 2024-02-04 ENCOUNTER — Ambulatory Visit: Payer: Self-pay | Admitting: Internal Medicine

## 2024-02-04 NOTE — Assessment & Plan Note (Signed)
 Continue lipitor.  Low cholesterol diet and exercise.  Follow lipid panel. Reviewed recent labs.  Lab Results  Component Value Date   CHOL 93 01/29/2024   HDL 25.10 (L) 01/29/2024   LDLCALC 38 01/29/2024   LDLDIRECT 72.0 10/08/2022   TRIG 147.0 01/29/2024   CHOLHDL 4 01/29/2024

## 2024-02-04 NOTE — Assessment & Plan Note (Signed)
 Continue aspirin , lipitor. Currently on entresto . No chest pain. Continue risk factor modification.

## 2024-02-04 NOTE — Assessment & Plan Note (Signed)
 Hgb 05/2023 - wnl.

## 2024-02-04 NOTE — Assessment & Plan Note (Signed)
 Continue diet and exercise.  Follow met b and a1c. Low carb diet and exercise.  Recent A1c 7.3. has received steroid injections. Unable to exercise as previously. No change in medication today. Follow sugars. Send in readings over the next few weeks. Reviewed outside sugars - most ranging 110-130s. Follow met b and A1c.

## 2024-02-04 NOTE — Assessment & Plan Note (Signed)
 Bilateral shoulder pain and wrist pain and pain in thighs. He is having to use his arms to stand. Feel aggravating shoulders and wrist. Will check ESR and CK. Consider rheumatology referral. Continue PT.

## 2024-02-04 NOTE — Telephone Encounter (Signed)
 Spoke to Ricardo Nash. Discussed labs. Did fall today. Denies head injury. Denies residual problems from the fall. Offered evaluation. Declines. Agreeable to referral to rheumatology.

## 2024-02-04 NOTE — Assessment & Plan Note (Signed)
 Had f/u with urology 01/21/24 - worsening urinary symptoms with increased frequency and worsening incontinence. Recommended fluconazole  200mg  and change flomax  to .4mg  bid. Urinary frequency has improved.

## 2024-02-04 NOTE — Assessment & Plan Note (Signed)
 Continue zoloft . Has good support - wife. No changes in medication today.

## 2024-02-04 NOTE — Assessment & Plan Note (Addendum)
 Saw Dr Eletha:  (7/023) -Overall the USN is relatively stable.  There is a nodule on the left that has increased in size and will require biopsy. Burnard - please order USN guided FNA biopsy of nodule.  12/24/21 - Biopsy is benign.  Changes in the USN year-to-year were not significant.  Plan was to see him back in one year with a repeat USN and TSH level. Need to confirm f/u.

## 2024-02-04 NOTE — Assessment & Plan Note (Signed)
 Continues on entrestro and spironolactone . Breathing stable. Does not appear to be volume overloaded. Follow metabolic panel.

## 2024-02-04 NOTE — Assessment & Plan Note (Signed)
 Continue lipitor  ?

## 2024-02-04 NOTE — Assessment & Plan Note (Signed)
 Continue cpap.

## 2024-02-04 NOTE — Assessment & Plan Note (Signed)
 Continues on entrestro and spironolactone . Blood pressure doing well. Follow metabolic panel.

## 2024-02-04 NOTE — Telephone Encounter (Signed)
Order placed for referral to rheumatology.  

## 2024-02-04 NOTE — Assessment & Plan Note (Signed)
 Previously noted to be in afib when sick in hospital.  Prior to this most recent hospitalization he saw a new cardiologist.  She recommended starting eliquis.  He is hesitant to start.  Was told by cardiology previously - did not need.  Reports had isolated afib when sick in hospital.  He is on aspirin .  No increased heart rate or palpitations.  Breathing stable. Recent holter no afib. Has ILR. No afib to date. Continue aspirin .

## 2024-02-05 DIAGNOSIS — M5451 Vertebrogenic low back pain: Secondary | ICD-10-CM | POA: Diagnosis not present

## 2024-02-05 DIAGNOSIS — R262 Difficulty in walking, not elsewhere classified: Secondary | ICD-10-CM | POA: Diagnosis not present

## 2024-02-05 DIAGNOSIS — R531 Weakness: Secondary | ICD-10-CM | POA: Diagnosis not present

## 2024-02-05 NOTE — Progress Notes (Signed)
 Remote Loop Recorder Transmission

## 2024-02-11 DIAGNOSIS — R262 Difficulty in walking, not elsewhere classified: Secondary | ICD-10-CM | POA: Diagnosis not present

## 2024-02-11 DIAGNOSIS — M5451 Vertebrogenic low back pain: Secondary | ICD-10-CM | POA: Diagnosis not present

## 2024-02-11 DIAGNOSIS — R531 Weakness: Secondary | ICD-10-CM | POA: Diagnosis not present

## 2024-02-16 DIAGNOSIS — R262 Difficulty in walking, not elsewhere classified: Secondary | ICD-10-CM | POA: Diagnosis not present

## 2024-02-16 DIAGNOSIS — R531 Weakness: Secondary | ICD-10-CM | POA: Diagnosis not present

## 2024-02-16 DIAGNOSIS — M5451 Vertebrogenic low back pain: Secondary | ICD-10-CM | POA: Diagnosis not present

## 2024-02-17 ENCOUNTER — Ambulatory Visit (INDEPENDENT_AMBULATORY_CARE_PROVIDER_SITE_OTHER): Admitting: Urology

## 2024-02-17 ENCOUNTER — Encounter: Payer: Self-pay | Admitting: Urology

## 2024-02-17 ENCOUNTER — Other Ambulatory Visit: Payer: Self-pay

## 2024-02-17 ENCOUNTER — Telehealth: Payer: Self-pay | Admitting: Internal Medicine

## 2024-02-17 VITALS — BP 138/56 | HR 86

## 2024-02-17 DIAGNOSIS — N401 Enlarged prostate with lower urinary tract symptoms: Secondary | ICD-10-CM | POA: Diagnosis not present

## 2024-02-17 DIAGNOSIS — R399 Unspecified symptoms and signs involving the genitourinary system: Secondary | ICD-10-CM | POA: Diagnosis not present

## 2024-02-17 DIAGNOSIS — N138 Other obstructive and reflux uropathy: Secondary | ICD-10-CM | POA: Diagnosis not present

## 2024-02-17 MED ORDER — LIDOCAINE HCL URETHRAL/MUCOSAL 2 % EX GEL
1.0000 | Freq: Once | CUTANEOUS | Status: AC
  Administered 2024-02-17: 1 via URETHRAL

## 2024-02-17 MED ORDER — SULFAMETHOXAZOLE-TRIMETHOPRIM 800-160 MG PO TABS
1.0000 | ORAL_TABLET | Freq: Once | ORAL | Status: AC
  Administered 2024-02-17: 1 via ORAL

## 2024-02-17 NOTE — Patient Instructions (Signed)

## 2024-02-17 NOTE — Progress Notes (Signed)
 Cystoscopy Procedure Note:  Indication: Worsening urinary symptoms with urgency, frequency, incontinence, elevated PVR greater than .  PSA was normal at 0.9 in October 2023, normal renal function  After informed consent and discussion of the procedure and its risks, Ricardo Nash was positioned and prepped in the standard fashion. Cystoscopy was performed with a flexible cystoscope. The urethra, bladder neck and entire bladder was visualized in a standard fashion. The prostate was large with obstructing lateral lobes and a moderate size median lobe. The ureteral orifices were visualized in their normal location and orientation.  Severe bladder trabeculations, no suspicious lesions, intravesical protrusion of prostate on retroflexion.  Imaging: Transrectal ultrasound was inserted into the rectum and measurements taken for a calculated prostate volume of 80g.  Findings: BPH with enlarged prostate, severe bladder trabeculations, suspect BPH as primary etiology of urinary symptoms with incomplete emptying, urgency, and incontinence  Assessment and Plan: 78 year old male with a number of comorbidities including obesity, diabetes(hemoglobin A1c 7.2), sleep apnea, hypogonadism on injections who reports at least 2 months of worsening urinary symptoms with urgency, frequency, incontinence.  Incomplete bladder emptying with documented PVRs greater than .  PSA has been normal, most recently 0.17 February 2022, and he has normal renal function.  Prostate today significantly enlarged measuring 80 g with obstructive appearing lateral lobes and large median lobe, elevated bladder neck on cystoscopy.  I think he will get significant improvement in his urinary symptoms with an outlet procedure, however with his frailty and poor mobility, may have some challenges postop in terms of accessing the bathroom, but do anticipate that would improve long-term.  We discussed the risks and benefits of HoLEP at  length.  The procedure requires general anesthesia and takes 1 to 2 hours, and a holmium laser is used to enucleate the prostate and push this tissue into the bladder.  A morcellator is then used to remove this tissue, which is sent for pathology.  The vast majority(>95%) of patients are able to discharge the same day with a catheter in place for 2 to 3 days, and will follow-up in clinic for a voiding trial.  We specifically discussed the risks of bleeding, infection, retrograde ejaculation, temporary urgency and urge incontinence, very low risk of long-term incontinence, urethral stricture/bladder neck contracture, pathologic evaluation of prostate tissue and possible detection of prostate cancer or other malignancy, and possible need for additional procedures.  Schedule HOLEP(80g)  Ricardo Burnet, MD 02/17/2024

## 2024-02-17 NOTE — Telephone Encounter (Signed)
 Pt wife dop off Readings and a note for Dr. Glendia. The readings has been placed in mail box.

## 2024-02-17 NOTE — Addendum Note (Signed)
 Addended by: ELOUISE SANTA BROCKS on: 02/17/2024 09:04 AM   Modules accepted: Orders

## 2024-02-17 NOTE — Progress Notes (Unsigned)
 Surgical Physician Order Form [facility]  Dr. Redell Burnet, MD  * Scheduling expectation : Next Available  *Length of Case: 1.5 hours   *Clearance needed: no  *Anticoagulation Instructions: Hold all anticoagulants  *Aspirin  Instructions: Hold Aspirin   *Post-op visit Date/Instructions:  1-3 day cath removal, 46-month MD.  *Diagnosis: BPH w/urinary obstruction  *Procedure:  HOLEP (47350)   Additional orders: N/A  -Admit type: OUTpatient  -Anesthesia: General  -VTE Prophylaxis Standing Order SCD's       Other:   -Standing Lab Orders Per Anesthesia    Lab other: UA&Urine Culture  -Standing Test orders EKG/Chest x-ray per Anesthesia       Test other:   - Medications:  Cipro 400mg  IV  -Other orders:  N/A

## 2024-02-18 ENCOUNTER — Telehealth: Payer: Self-pay

## 2024-02-18 DIAGNOSIS — M5451 Vertebrogenic low back pain: Secondary | ICD-10-CM | POA: Diagnosis not present

## 2024-02-18 DIAGNOSIS — R262 Difficulty in walking, not elsewhere classified: Secondary | ICD-10-CM | POA: Diagnosis not present

## 2024-02-18 DIAGNOSIS — R531 Weakness: Secondary | ICD-10-CM | POA: Diagnosis not present

## 2024-02-18 NOTE — Telephone Encounter (Signed)
 Per Dr. Francisca Patient is to be scheduled for Holmium Laser Enucleation of the Prostate   Mr. Behnken was contacted and possible surgical dates were discussed, Monday November 3rd was agreed upon for surgery.   Patient was instructed that Dr. Francisca will require them to provide a pre-op UA & CX prior to surgery. This was ordered and scheduled drop off appointment was made for 03/03/2024.    Patient was directed to call [phone] between 1-3pm the day before surgery to find out surgical arrival time.  Instructions were given not to eat or drink from midnight on the night before surgery and have a driver for the day of surgery. On the surgery day patient was instructed to enter through the [facility] entrance of [facility] report the Same Day Surgery desk.   Pre-Admit Testing will be in contact via phone to set up an interview with the anesthesia team to review your history and medications prior to surgery.   Reminder of this information was sent via MyChart to the patient.

## 2024-02-18 NOTE — Telephone Encounter (Signed)
   Pre-operative Risk Assessment    Patient Name: Ricardo Nash  DOB: 1946-03-24 MRN: [id number]   Date of last office visit: 12/17/23 REDELL CAVE, MD Date of next office visit: NONE   Request for Surgical Clearance    Procedure:  HOLMIUM LASER ENUCLEATION OF THE PROSTATE  Date of Surgery:  Clearance 03/14/24                                Surgeon:  DR REDELL BURNET Surgeon's Group or Practice Name:  [facility] Phone number:  [phone] Fax number:  [phone]   Type of Clearance Requested:   - Medical  - Pharmacy:  Hold Aspirin      Type of Anesthesia:  General    Additional requests/questions:  WILL NEED A PACEMAKER CLEARANCE FORM  Signed, Lucie DELENA Ku   02/18/2024, 5:29 PM

## 2024-02-18 NOTE — Progress Notes (Signed)
  Phone Number: [phone] for Surgical Coordinator Fax Number: [phone]  REQUEST FOR SURGICAL CLEARANCE      Date: 02/18/2024  [facility]ed to: [facility]  Surgeon: Dr. Redell Burnet, MD     Date of Surgery: 03/14/2024  Operation: Holmium Laser Enucleation of the Prostate   Anesthesia Type: General   Diagnosis: Benign Prostatic Hyperplasia with Urinary Obstruction  Patient Requires:   Cardiac / Vascular Clearance : Yes  Reason: Will need patient to hold ASA prior to surgery and will also need a PaceMaker Clearance form    Risk Assessment:    Low   []       Moderate   []     High   []           This patient is optimized for surgery  YES []       NO   []    I recommend further assessment/workup prior to surgery. YES []      NO  []   Appointment scheduled for: _______________________   Further recommendations: ____________________________________     Physician Signature:__________________________________   Printed Name: ________________________________________   Date: _________________

## 2024-02-18 NOTE — Progress Notes (Signed)
   [facility] Surgical Posting Form  Surgery Date: Date: 03/14/2024  Surgeon: Dr. Redell Burnet, MD  Inpt ( No  )   Outpt (Yes)   Obs ( No  )   Diagnosis: N40.1, N13.8 Benign Prostatic Hyperplasia with Urinary Obstruction  -CPT: 47350  Surgery: Holmium Laser Enucleation of the Prostate  Stop Anticoagulations: Yes and also hold ASA  Cardiac/Medical/Pulmonary Clearance needed: Yes  Clearance needed from Dr: [facility] for ASA and Pace Maker  Clearance request sent on: Date: 02/18/24  *Orders entered into EPIC  Date: 02/18/24   *Case booked in EPIC  Date: 02/18/24  *Notified pt of Surgery: Date: 02/18/24  PRE-OP UA & CX: yes, will obtain in clinic on 03/03/2024  *Placed into Prior Authorization Work [place] Date: 02/18/24  Assistant/laser/rep:No

## 2024-02-18 NOTE — Telephone Encounter (Signed)
 Reviewed outside sugar readings. Most ranging 117-130s. Continue current medication. Continue low carb diet. Exercise as tolerated. He was also questioning his appt with rheumatology. I have placed the order and per referral team, rheumatology has the information and reviewing. Should be contacted with appt date and time. Let us  know if any problems.

## 2024-02-18 NOTE — Progress Notes (Signed)
 Remote Loop Recorder Transmission

## 2024-02-18 NOTE — Telephone Encounter (Signed)
 LM for patient. Please relay message to him when he returns call.

## 2024-02-19 ENCOUNTER — Telehealth (HOSPITAL_BASED_OUTPATIENT_CLINIC_OR_DEPARTMENT_OTHER): Payer: Self-pay | Admitting: *Deleted

## 2024-02-19 ENCOUNTER — Other Ambulatory Visit: Payer: Self-pay | Admitting: Internal Medicine

## 2024-02-19 NOTE — Telephone Encounter (Signed)
 Pt has been scheduled tele preop appt 03/03/24. Med rec and consent are done. Pt has been given [phone] # preop APP will call from.     Patient Consent for Virtual Visit        Ricardo Nash has provided verbal consent on 02/19/2024 for a virtual visit (video or telephone).   CONSENT FOR VIRTUAL VISIT FOR:  Ricardo Nash  By participating in this virtual visit I agree to the following:  I hereby voluntarily request, consent and authorize [facility] and its employed or contracted physicians, physician assistants, nurse practitioners or other licensed health care professionals (the Practitioner), to provide me with telemedicine health care services (the "Services) as deemed necessary by the treating Practitioner. I acknowledge and consent to receive the Services by the Practitioner via telemedicine. I understand that the telemedicine visit will involve communicating with the Practitioner through live audiovisual communication technology and the disclosure of certain medical information by electronic transmission. I acknowledge that I have been given the opportunity to request an in-person assessment or other available alternative prior to the telemedicine visit and am voluntarily participating in the telemedicine visit.  I understand that I have the right to withhold or withdraw my consent to the use of telemedicine in the course of my care at any time, without affecting my right to future care or treatment, and that the Practitioner or I may terminate the telemedicine visit at any time. I understand that I have the right to inspect all information obtained and/or recorded in the course of the telemedicine visit and may receive copies of available information for a reasonable fee.  I understand that some of the potential risks of receiving the Services via telemedicine include:  Delay or interruption in medical evaluation due to technological equipment failure or  disruption; Information transmitted may not be sufficient (e.g. poor resolution of images) to allow for appropriate medical decision making by the Practitioner; and/or  In rare instances, security protocols could fail, causing a breach of personal health information.  Furthermore, I acknowledge that it is my responsibility to provide information about my medical history, conditions and care that is complete and accurate to the best of my ability. I acknowledge that Practitioner's advice, recommendations, and/or decision may be based on factors not within their control, such as incomplete or inaccurate data provided by me or distortions of diagnostic images or specimens that may result from electronic transmissions. I understand that the practice of medicine is not an exact science and that Practitioner makes no warranties or guarantees regarding treatment outcomes. I acknowledge that a copy of this consent can be made available to me via my patient portal [facility] MyChart), or I can request a printed copy by calling the office of [facility].    I understand that my insurance will be billed for this visit.   I have read or had this consent read to me. I understand the contents of this consent, which adequately explains the benefits and risks of the Services being provided via telemedicine.  I have been provided ample opportunity to ask questions regarding this consent and the Services and have had my questions answered to my satisfaction. I give my informed consent for the services to be provided through the use of telemedicine in my medical care

## 2024-02-19 NOTE — Telephone Encounter (Signed)
 Pt has been scheduled tele preop appt 03/03/24. Med rec and consent are done. Pt has been given [phone] # preop APP will call from.

## 2024-02-19 NOTE — Telephone Encounter (Signed)
   Name: Ricardo Nash  DOB: Feb 23, 1946  MRN: [id number]  Primary Cardiologist: Redell Cave, MD   Preoperative team, please contact this patient and set up a phone call appointment for further preoperative risk assessment. Please obtain consent and complete medication review. Thank you for your help.  I confirm that guidance regarding antiplatelet and oral anticoagulation therapy has been completed and, if necessary, noted below.  Per office protocol, if patient is without any new symptoms or concerns at the time of their virtual visit, he may hold ASA for 7 days prior to procedure. Please resume ASA as soon as possible postprocedure, at the discretion of the surgeon.    I also confirmed the patient resides in the state of [place] . As per [facility] Medical Board telemedicine laws, the patient must reside in the state in which the provider is licensed.   Lamarr Satterfield, NP 02/19/2024, 8:03 AM [facility]

## 2024-02-22 ENCOUNTER — Ambulatory Visit

## 2024-02-22 DIAGNOSIS — I480 Paroxysmal atrial fibrillation: Secondary | ICD-10-CM | POA: Diagnosis not present

## 2024-02-22 LAB — CUP PACEART REMOTE DEVICE CHECK
Date Time Interrogation Session: 20251013001400
Implantable Pulse Generator Implant Date: 20250225
Pulse Gen Serial Number: 135133

## 2024-02-23 DIAGNOSIS — I4891 Unspecified atrial fibrillation: Secondary | ICD-10-CM | POA: Diagnosis not present

## 2024-02-23 DIAGNOSIS — I700 Atherosclerosis of aorta: Secondary | ICD-10-CM | POA: Diagnosis not present

## 2024-02-23 DIAGNOSIS — E1142 Type 2 diabetes mellitus with diabetic polyneuropathy: Secondary | ICD-10-CM | POA: Diagnosis not present

## 2024-02-23 DIAGNOSIS — R262 Difficulty in walking, not elsewhere classified: Secondary | ICD-10-CM | POA: Diagnosis not present

## 2024-02-23 DIAGNOSIS — E785 Hyperlipidemia, unspecified: Secondary | ICD-10-CM | POA: Diagnosis not present

## 2024-02-23 DIAGNOSIS — D6869 Other thrombophilia: Secondary | ICD-10-CM | POA: Diagnosis not present

## 2024-02-23 DIAGNOSIS — M5451 Vertebrogenic low back pain: Secondary | ICD-10-CM | POA: Diagnosis not present

## 2024-02-23 DIAGNOSIS — R531 Weakness: Secondary | ICD-10-CM | POA: Diagnosis not present

## 2024-02-23 DIAGNOSIS — I10 Essential (primary) hypertension: Secondary | ICD-10-CM | POA: Diagnosis not present

## 2024-02-23 DIAGNOSIS — K219 Gastro-esophageal reflux disease without esophagitis: Secondary | ICD-10-CM | POA: Diagnosis not present

## 2024-02-23 DIAGNOSIS — F325 Major depressive disorder, single episode, in full remission: Secondary | ICD-10-CM | POA: Diagnosis not present

## 2024-02-23 DIAGNOSIS — I429 Cardiomyopathy, unspecified: Secondary | ICD-10-CM | POA: Diagnosis not present

## 2024-02-23 NOTE — Progress Notes (Signed)
 Remote Loop Recorder Transmission

## 2024-02-24 ENCOUNTER — Ambulatory Visit: Payer: Self-pay | Admitting: Cardiology

## 2024-02-24 ENCOUNTER — Other Ambulatory Visit: Payer: Self-pay | Admitting: Urology

## 2024-02-24 ENCOUNTER — Other Ambulatory Visit: Payer: Self-pay | Admitting: Cardiology

## 2024-02-25 DIAGNOSIS — M5451 Vertebrogenic low back pain: Secondary | ICD-10-CM | POA: Diagnosis not present

## 2024-02-25 DIAGNOSIS — R531 Weakness: Secondary | ICD-10-CM | POA: Diagnosis not present

## 2024-02-25 DIAGNOSIS — R262 Difficulty in walking, not elsewhere classified: Secondary | ICD-10-CM | POA: Diagnosis not present

## 2024-02-29 ENCOUNTER — Encounter: Payer: Self-pay | Admitting: Internal Medicine

## 2024-02-29 NOTE — Telephone Encounter (Signed)
 Reviewed his message. He stated he was having surgery/procedure - 03/14/24. Please notify him to notify cardiology regarding upcoming procedure to get their recommendations and pre op clearance. Let me know if we need to call to schedule appt. Also, regarding his gabapentin , please clarify how he is taking - confirm on gabapentin  100mg  tid. If this is correct, confirm he is tolerating. Any adverse effects.

## 2024-03-01 DIAGNOSIS — R531 Weakness: Secondary | ICD-10-CM | POA: Diagnosis not present

## 2024-03-01 DIAGNOSIS — M5451 Vertebrogenic low back pain: Secondary | ICD-10-CM | POA: Diagnosis not present

## 2024-03-01 DIAGNOSIS — R262 Difficulty in walking, not elsewhere classified: Secondary | ICD-10-CM | POA: Diagnosis not present

## 2024-03-02 NOTE — Telephone Encounter (Signed)
 Please call Mr Gurganus and confirm he is tolerating the gabapentin  and that he feels he needs an increased dose through out the day and evening. If so, I can increase the gabapentin  to 100mg  - take two tid.

## 2024-03-02 NOTE — Telephone Encounter (Signed)
 called pt he states he is tolderating gabapentin  well so he would like to increase the dosage to see if it will help more with his pain

## 2024-03-03 ENCOUNTER — Other Ambulatory Visit

## 2024-03-03 ENCOUNTER — Ambulatory Visit: Attending: Cardiovascular Disease | Admitting: Nurse Practitioner

## 2024-03-03 DIAGNOSIS — N401 Enlarged prostate with lower urinary tract symptoms: Secondary | ICD-10-CM | POA: Diagnosis not present

## 2024-03-03 DIAGNOSIS — N138 Other obstructive and reflux uropathy: Secondary | ICD-10-CM | POA: Diagnosis not present

## 2024-03-03 DIAGNOSIS — Z01810 Encounter for preprocedural cardiovascular examination: Secondary | ICD-10-CM

## 2024-03-03 DIAGNOSIS — R531 Weakness: Secondary | ICD-10-CM | POA: Diagnosis not present

## 2024-03-03 DIAGNOSIS — R262 Difficulty in walking, not elsewhere classified: Secondary | ICD-10-CM | POA: Diagnosis not present

## 2024-03-03 DIAGNOSIS — M5451 Vertebrogenic low back pain: Secondary | ICD-10-CM | POA: Diagnosis not present

## 2024-03-03 LAB — URINALYSIS, COMPLETE
Bilirubin, UA: NEGATIVE
Nitrite, UA: NEGATIVE
Specific Gravity, UA: 1.015 (ref 1.005–1.030)
Urobilinogen, Ur: 1 mg/dL (ref 0.2–1.0)
pH, UA: 6 (ref 5.0–7.5)

## 2024-03-03 LAB — MICROSCOPIC EXAMINATION

## 2024-03-03 NOTE — Progress Notes (Signed)
 Virtual Visit via Telephone Note   Because of Ricardo Nash co-morbid illnesses, he is at least at moderate risk for complications without adequate follow up.  This format is felt to be most appropriate for this patient at this time.  Due to technical limitations with video connection (technology), today's appointment will be conducted as an audio only telehealth visit, and Kashawn Dirr verbally agreed to proceed in this manner.   All issues noted in this document were discussed and addressed.  No physical exam could be performed with this format.  Evaluation Performed:  Preoperative cardiovascular risk assessment _____________   Date:  03/03/2024   Patient ID:  Ricardo Nash, DOB Jan 10, 1946, MRN [id number] Patient Location:  Home Provider location:   Office  Primary Care Provider:  Glendia Shad, MD Primary Cardiologist:  Redell Cave, MD  Chief Complaint / Patient Profile   78 y.o. y/o male with a h/o nonobstructive CAD, NICM, paroxysmal atrial fibrillation s/p ILR, hypertension, hyperlipidemia, TIA, type 2 diabetes and OSA who is pending HOLMIUM LASER ENUCLEATION OF THE PROSTATE on 03/14/2024 with Dr. Redell Burnet of [facility] and presents today for telephonic preoperative cardiovascular risk assessment.  History of Present Illness    Ricardo Nash is a 78 y.o. male who presents via audio/video conferencing for a telehealth visit today.  Pt was last seen in cardiology clinic on 12/17/2023 by Dr. Cave. At that time Koal Eslinger was doing well. The patient is now pending procedure as outlined above. Since his last visit, he has done well from a cardiac standpoint.  He denies chest pain, palpitations, dyspnea, pnd, orthopnea, n, v, dizziness, syncope, edema, weight gain, or early satiety. All other systems reviewed and are otherwise negative except as noted above.   Past Medical History    Past Medical History:  Diagnosis Date    Atrial fibrillation (HCC)    C. difficile colitis    CAD (coronary artery disease)    Chronic heart failure with mildly reduced ejection fraction (HFmrEF, 41-49%) (HCC)    Coronary artery disease    Depression    Diabetes mellitus (HCC)    Diverticulitis    Diverticulosis    Dysrhythmia    PAF   Environmental allergies    GERD (gastroesophageal reflux disease)    Goiter    intrathoracic, s/p benign biopsy (Dr Eletha)   Hypercholesterolemia    Hypertension    NICM (nonischemic cardiomyopathy) (HCC)    Osteoarthritis    cervical spine, lumbar spine   Paroxysmal atrial fibrillation (HCC)    Pneumonia    PONV (postoperative nausea and vomiting)    Sleep apnea    CPAP @ NIGHT   Stroke [facility])    TIA (transient ischemic attack)    Past Surgical History:  Procedure Laterality Date   BACK SURGERY  12/2006   s/p fusion of L4-L5   BACK SURGERY  2024   capsule endoscopy     CATARACT EXTRACTION  2011   Dr. Cleatus   CERVICAL DISC SURGERY  2002   CHOLECYSTECTOMY  2024   COLONOSCOPY WITH PROPOFOL  N/A 07/20/2017   Procedure: COLONOSCOPY WITH PROPOFOL ;  Surgeon: Viktoria Lamar DASEN, MD;  Location: [facility] ENDOSCOPY;  Service: Endoscopy;  Laterality: N/A;   COLONOSCOPY WITH PROPOFOL  N/A 07/27/2023   Procedure: COLONOSCOPY WITH PROPOFOL ;  Surgeon: Maryruth Ole DASEN, MD;  Location: [facility] ENDOSCOPY;  Service: Endoscopy;  Laterality: N/A;   EYE SURGERY     cataract bilateral 10/1999   eyelid  reduction     Dr. Gretta   gallbladder removed  12/2022   INGUINAL HERNIA REPAIR  1991   Dr, Claudene   LEFT HEART CATH AND CORONARY ANGIOGRAPHY Left 06/02/2023   Procedure: LEFT HEART CATH AND CORONARY ANGIOGRAPHY;  Surgeon: Mady Bruckner, MD;  Location: [facility] INVASIVE CV LAB;  Service: Cardiovascular;  Laterality: Left;   LUMBAR LAMINECTOMY/DECOMPRESSION MICRODISCECTOMY N/A 10/15/2020   Procedure: Laminectomy - Lumbar Two-Lumbar Three - Lumbar Three-Lumbar Four with sublaminar decompression;  Surgeon:  Joshua Alm RAMAN, MD;  Location: [facility] OR;  Service: Neurosurgery;  Laterality: N/A;  Laminectomy - Lumbar Two-Lumbar Three - Lumbar Three-Lumbar Four with sublaminar decompression   NOSE SURGERY     turbinate reduction   SEPTOPLASTY  1975   SHOULDER SURGERY  2000   rotator cuff   spinal stimulator      Allergies  Allergies  Allergen Reactions   Penicillins Hives    Childhood allergy (age 53)   Penicillin G     Other Reaction(s): Weal (disorder)    Home Medications    Prior to Admission medications   Medication Sig Start Date End Date Taking? Authorizing Provider  ACCU-CHEK GUIDE TEST test strip USE AS DIRECTED TO TEST BLOOD GLUCOSE LEVELS TWICE DAILY 01/06/24   Glendia Shad, MD  ALPRAZolam  (XANAX ) 0.25 MG tablet Take 1 tablet (0.25 mg total) by mouth daily as needed. Patient taking differently: Take 0.25 mg by mouth daily as needed for anxiety or sleep. 06/04/23   Glendia Shad, MD  aspirin  EC 81 MG tablet Take 81 mg by mouth every evening. Swallow whole.    [provider]  atorvastatin  (LIPITOR) 40 MG tablet TAKE 1 TABLET BY MOUTH ONCE EVERY EVENING 12/01/23   Darliss Rogue, MD  azelastine  (OPTIVAR ) 0.05 % ophthalmic solution Place 1 drop into both eyes daily. 03/23/23   [provider]  Azelastine -Fluticasone  137-50 MCG/ACT SUSP PLACE 1 SPRAY INTO EACH NOSTRIL ONCE EVERY MORNING AND AT BEDTIME 08/17/23   Glendia Shad, MD  blood glucose meter kit and supplies Dispense based on patient and insurance preference. Use up to four times daily as directed. (FOR ICD-10 E10.9, E11.9). 11/11/19   Glendia Shad, MD  carvedilol  (COREG ) 25 MG tablet Take 1 tablet (25 mg total) by mouth 2 (two) times daily with a meal. 10/02/23   Glendia Shad, MD  empagliflozin  (JARDIANCE ) 10 MG TABS tablet Take 1 tablet (10 mg total) by mouth daily before breakfast. 12/17/23   Darliss Rogue, MD  fluorometholone (FML) 0.1 % ophthalmic suspension Place 1 drop into both eyes daily.  03/25/23   [provider]  gabapentin  (NEURONTIN ) 100 MG capsule Take 1 capsule (100 mg total) by mouth 3 (three) times daily. 02/03/24   Glendia Shad, MD  levocetirizine (XYZAL ) 5 MG tablet TAKE 1 TABLET BY MOUTH ONCE EVERY EVENING 06/08/23   Glendia Shad, MD  metFORMIN  (GLUCOPHAGE ) 1000 MG tablet TAKE 1 TABLET BY MOUTH TWICE DAILY WITH MEALS 10/09/23   Glendia Shad, MD  methocarbamol  (ROBAXIN ) 500 MG tablet Take 1 tablet (500 mg total) by mouth every 8 (eight) hours as needed for muscle spasms. 01/01/24   Vincente Shivers, NP  montelukast  (SINGULAIR ) 10 MG tablet Take 1 tablet (10 mg total) by mouth at bedtime. 06/04/23   Glendia Shad, MD  Multiple Vitamin (MULTIVITAMIN WITH MINERALS) TABS tablet Take 1 tablet by mouth in the morning.    [provider]  pantoprazole  (PROTONIX ) 40 MG tablet TAKE 1 TABLET BY MOUTH ONCE DAILY 12/01/23  Glendia Shad, MD  pioglitazone  (ACTOS ) 15 MG tablet Take 1 tablet (15 mg total) by mouth daily. 10/02/23   Glendia Shad, MD  sacubitril -valsartan  (ENTRESTO ) 49-51 MG Take 1 tablet by mouth 2 (two) times daily. 12/17/23   Darliss Rogue, MD  sertraline  (ZOLOFT ) 100 MG tablet Take 1 tablet (100 mg total) by mouth daily. 10/02/23   Glendia Shad, MD  spironolactone  (ALDACTONE ) 25 MG tablet Take 25 mg by mouth daily. 02/24/24   [provider]  tamsulosin  (FLOMAX ) 0.4 MG CAPS capsule TAKE 2 CAPSULES BY MOUTH ONCE DAILY AFTER SUPPER Patient taking differently: Take 0.4 mg by mouth 2 (two) times daily. 02/24/24   Francisca Rogue BROCKS, MD  testosterone  cypionate (DEPOTESTOSTERONE CYPIONATE) 200 MG/ML injection INJECT 0.5MLS INTRAMUSCULARLY EVERY 14 DAYS 05/12/23   Francisca Rogue BROCKS, MD  traZODone  (DESYREL ) 50 MG tablet Take 1 tablet (50 mg total) by mouth at bedtime as needed for sleep. 10/02/23   Glendia Shad, MD  triamcinolone  cream (KENALOG ) 0.1 % Apply 1 Application topically 2 (two) times daily. Patient not taking: Reported on  03/03/2024 02/03/24   Glendia Shad, MD  TRULICITY  3 MG/0.5ML [facility] INJECT 3MG  SUBCUTANEOUSLY ONCE A WEEK 02/22/24   Glendia Shad, MD    Physical Exam    Vital Signs:  [facility] does not have vital signs available for review today.  Given telephonic nature of communication, physical exam is limited. AAOx3. NAD. Normal affect.  Speech and respirations are unlabored.  Accessory Clinical Findings    None  Assessment & Plan    1.  Preoperative Cardiovascular Risk Assessment:  According to the Revised Cardiac Risk Index (RCRI), his Perioperative Risk of Major Cardiac Event is (%): 0.9. His Functional Capacity in METs is: 5.62 according to the [facility] Activity Status Index (DASI).Therefore, based on ACC/AHA guidelines, patient would be at acceptable risk for the planned procedure without further cardiovascular testing.   The patient was advised that if he develops new symptoms prior to surgery to contact our office to arrange for a follow-up visit, and he verbalized understanding.   Per office protocol, he may hold ASA for 5-7 days prior to procedure. Please resume ASA as soon as possible postprocedure, at the discretion of the surgeon.    A copy of this note will be routed to requesting surgeon.  Time:   Today, I have spent 5 minutes with the patient with telehealth technology discussing medical history, symptoms, and management plan.     Damien BROCKS Braver, NP  03/03/2024, 1:51 PM

## 2024-03-04 ENCOUNTER — Telehealth: Payer: Self-pay

## 2024-03-04 NOTE — Telephone Encounter (Signed)
 Received Cardiac Clearance from [facility]. Patient aware to hold ASA x 7 days prior to surgery. Patient also informed to hold Trulicity  1 week prior to surgery. Patient verbalized understanding.

## 2024-03-07 ENCOUNTER — Encounter
Admission: RE | Admit: 2024-03-07 | Discharge: 2024-03-07 | Disposition: A | Discharge status: Home / Self Care | Source: Ambulatory Visit | Attending: Urology | Admitting: Urology

## 2024-03-07 ENCOUNTER — Encounter: Payer: Self-pay | Admitting: Urgent Care

## 2024-03-07 ENCOUNTER — Other Ambulatory Visit: Payer: Self-pay

## 2024-03-07 VITALS — Ht 69.0 in | Wt 175.0 lb

## 2024-03-07 DIAGNOSIS — I2510 Atherosclerotic heart disease of native coronary artery without angina pectoris: Secondary | ICD-10-CM

## 2024-03-07 DIAGNOSIS — I429 Cardiomyopathy, unspecified: Secondary | ICD-10-CM | POA: Diagnosis not present

## 2024-03-07 DIAGNOSIS — I10 Essential (primary) hypertension: Secondary | ICD-10-CM

## 2024-03-07 DIAGNOSIS — D509 Iron deficiency anemia, unspecified: Secondary | ICD-10-CM

## 2024-03-07 DIAGNOSIS — E1159 Type 2 diabetes mellitus with other circulatory complications: Secondary | ICD-10-CM | POA: Diagnosis not present

## 2024-03-07 DIAGNOSIS — Z01818 Encounter for other preprocedural examination: Secondary | ICD-10-CM | POA: Diagnosis not present

## 2024-03-07 LAB — CBC
HCT: 34.1 % — ABNORMAL LOW (ref 39.0–52.0)
Hemoglobin: 10.5 g/dL — ABNORMAL LOW (ref 13.0–17.0)
MCH: 24.9 pg — ABNORMAL LOW (ref 26.0–34.0)
MCHC: 30.8 g/dL (ref 30.0–36.0)
MCV: 80.8 fL (ref 80.0–100.0)
Platelets: 362 K/uL (ref 150–400)
RBC: 4.22 MIL/uL (ref 4.22–5.81)
RDW: 15.7 % — ABNORMAL HIGH (ref 11.5–15.5)
WBC: 15.5 K/uL — ABNORMAL HIGH (ref 4.0–10.5)
nRBC: 0 % (ref 0.0–0.2)

## 2024-03-07 NOTE — Patient Instructions (Addendum)
 Bring your device to turn off your Spinal cord stimulator   Your procedure is scheduled on: Monday 03/14/24 Report to the Registration Desk on the 1st floor of the [facility]. To find out your arrival time, please call [phone] between 1PM - 3PM on: Friday 03/11/24 If your arrival time is 6:00 am, do not arrive before that time as the Medical Mall entrance doors do not open until 6:00 am.  REMEMBER: Instructions that are not followed completely may result in serious medical risk, up to and including death; or upon the discretion of your surgeon and anesthesiologist your surgery may need to be rescheduled.  Do not eat food or drink any liquids after midnight the night before surgery.  No gum chewing or hard candies.  One week prior to surgery: Stop Anti-inflammatories (NSAIDS) such as Advil, Aleve, Ibuprofen, Motrin, Naproxen, Naprosyn and Aspirin  based products such as Excedrin, Goody's Powder, BC Powder.  You may however, continue to take Tylenol  if needed for pain up until the day of surgery.  Stop ANY OVER THE COUNTER supplements and vitamins until after surgery.   **Follow guidelines for insulin  and diabetes medications.** Trulicity , per patient last dose Sunday 03/06/24. Jardiance  hold for 3 days with last dose to be taken on Thursday 03/10/24. Metformin  hold for 2 days with last dose to be taken on Friday 03/11/24. Continue your Actos  as usual.  **Follow recommendations regarding stopping blood thinners.** Aspirin  per patient to be held for 7 days with last dose taken Sunday 03/06/24.  Continue taking all of your other prescription medications up until the day of surgery.  ON THE DAY OF SURGERY ONLY TAKE THESE MEDICATIONS WITH SIPS OF WATER:  carvedilol  (COREG ) 25 MG tablet  gabapentin  (NEURONTIN ) 100 MG capsule  pantoprazole  (PROTONIX ) 40 MG tablet  sertraline  (ZOLOFT ) 100 MG tablet  tamsulosin  (FLOMAX ) 0.4 MG CAPS capsule   No Alcohol for 24 hours before or after  surgery.  No Smoking including e-cigarettes for 24 hours before surgery.  No chewable tobacco products for at least 6 hours before surgery.  No nicotine patches on the day of surgery.  Do not use any recreational drugs for at least a week (preferably 2 weeks) before your surgery.  Please be advised that the combination of cocaine and anesthesia may have negative outcomes, up to and including death. If you test positive for cocaine, your surgery will be cancelled.  On the morning of surgery brush your teeth with toothpaste and water, you may rinse your mouth with mouthwash if you wish. Do not swallow any toothpaste or mouthwash.  Shower/Bathe prior to arrival to surgery  Do not wear lotions, powders, deodorants or cologne/perfumes on the day of surgery  Do not shave body hair from the neck down 48 hours before surgery.  Wear comfortable clothing (specific to your surgery type) to the hospital.  Do not wear jewelry, make-up, hairpins, clips or nail polish.  For welded (permanent) jewelry: bracelets, anklets, waist bands, etc.  Please have this removed prior to surgery.  If it is not removed, there is a chance that hospital personnel will need to cut it off on the day of surgery.  Contact lenses, hearing aids and dentures may not be worn into surgery.  Do not bring valuables to the hospital. [facility] is not responsible for any missing/lost belongings or valuables.   Bring your C-PAP to the hospital in case you may have to spend the night.   Notify your doctor if there is any  change in your medical condition (cold, fever, infection).  After surgery, you can help prevent lung complications by doing breathing exercises.  Take deep breaths and cough every 1-2 hours. Your doctor may order a device called an Incentive Spirometer to help you take deep breaths.  If you are being discharged the day of surgery, you will not be allowed to drive home. You will need a responsible individual  to drive you home and stay with you for 24 hours after surgery.   Please call the Pre-admissions Testing Dept. at [phone] if you have any questions about these instructions.  Surgery Visitation Policy:  Patients having surgery or a procedure may have two visitors.  Children under the age of 45 must have an adult with them who is not the patient.  Merchandiser, Retail to address health-related social needs:  [email or web address]

## 2024-03-08 DIAGNOSIS — M5451 Vertebrogenic low back pain: Secondary | ICD-10-CM | POA: Diagnosis not present

## 2024-03-08 DIAGNOSIS — R531 Weakness: Secondary | ICD-10-CM | POA: Diagnosis not present

## 2024-03-08 DIAGNOSIS — R262 Difficulty in walking, not elsewhere classified: Secondary | ICD-10-CM | POA: Diagnosis not present

## 2024-03-09 ENCOUNTER — Ambulatory Visit: Payer: Self-pay | Admitting: Urology

## 2024-03-09 ENCOUNTER — Encounter: Payer: Self-pay | Admitting: Urology

## 2024-03-09 ENCOUNTER — Other Ambulatory Visit: Payer: Self-pay | Admitting: Urology

## 2024-03-09 ENCOUNTER — Other Ambulatory Visit: Payer: Self-pay | Admitting: Internal Medicine

## 2024-03-09 LAB — CULTURE, URINE COMPREHENSIVE

## 2024-03-09 MED ORDER — FLUCONAZOLE 100 MG PO TABS: ORAL_TABLET | ORAL | 0 refills | Status: DC

## 2024-03-09 NOTE — Telephone Encounter (Signed)
 Spoke with pt. Pt. Advised of results and verbalized understanding. Patient asked for medication to be sent to [facility] which I have done, Pt. Verbalized understanding.

## 2024-03-10 ENCOUNTER — Encounter: Payer: Self-pay | Admitting: Urology

## 2024-03-10 NOTE — Progress Notes (Signed)
 Perioperative / Anesthesia Services  Pre-Admission Testing Clinical Review / Pre-Operative Anesthesia Consult  Date: 03/10/24  PATIENT DEMOGRAPHICS: Name: Ricardo Nash DOB: December 29, 1945 MRN:   [id number]  Note: Available PAT nursing documentation and vital signs have been reviewed. Clinical nursing staff has updated patient's PMH/PSHx, current medication list, and drug allergies/intolerances to ensure complete and comprehensive history available to assist care teams in MDM as it pertains to the aforementioned surgical procedure and anticipated anesthetic course. Extensive review of available clinical information personally performed. Nursing documentation reviewed. Reubens PMH and PSHx updated with any diagnoses and/or procedures that I have knowledge of that may have been inadvertently omitted during his intake with the pre-admission testing department's nursing staff.  PLANNED SURGICAL PROCEDURE(S):   Case: [id number] Date/Time: 03/14/24 1030   Procedure: ENUCLEATION, PROSTATE, USING LASER, WITH MORCELLATION   Anesthesia type: General   Diagnosis: Benign prostatic hyperplasia with urinary obstruction [N40.1, N13.8]   Pre-op diagnosis: Benign Prostatic Hyperplasia with Urinary Obstruction   Location: [facility] OR ROOM 10 / [facility]   Surgeons: Francisca Redell BROCKS, MD        CLINICAL DISCUSSION: Ricardo Nash is a 78 y.o. male who is submitted for pre-surgical anesthesia review and clearance prior to him undergoing the above procedure. Patient is a Former Games Developer. Pertinent PMH includes: CAD, PAF, NICM, HFrEF, CVA/TIA, chronic cerebral microvascular disease, LBBB, NSVT, PSVT, aortic atherosclerosis, HTN, HLD, goiter, OSAH (on nocturnal PAP therapy), GERD (on daily PPI), hypogonadism (on TRT), BPH with BOO, OA, lumbar DDD, anxiety (on BZO), depression, insomnia.  Patient is followed by cardiology Thera, MD). He was last seen in the cardiology clinic on ***;  notes reviewed. ***At the time of his clinic visit, patient doing well overall from a cardiovascular perspective. Patient denied any chest pain, shortness of breath, PND, orthopnea, palpitations, significant peripheral edema, weakness, fatigue, vertiginous symptoms, or presyncope/syncope. Patient with a past medical history significant for cardiovascular diagnoses. Documented physical exam was grossly benign, providing no evidence of acute exacerbation and/or decompensation of the patient's known cardiovascular conditions.  ***  Blood pressure***controlled at *** mmHg on currently prescribed *** therapies.  Patient is on *** for his HLD diagnosis and ASCVD prevention. ***Patient is not diabetic. ***He does not have an OSAH diagnosis. ***FC. No changes were made to his medication regimen during his visit with cardiology.  Patient scheduled to follow-up with outpatient cardiology in***months or sooner if needed.  Ricardo Nash is scheduled for an elective ENUCLEATION, PROSTATE, USING LASER, WITH MORCELLATION on 03/14/2024 with Dr. Redell BROCKS Francisca, MD. Given patient's past medical history significant for cardiovascular diagnoses, presurgical cardiac clearance was sought by the PAT team. Per cardiology, according to the Revised Cardiac Risk Index (RCRI), his Perioperative Risk of Major Cardiac Event is (%): 0.9. His Functional Capacity in METs is: 5.62 according to the Duke Activity Status Index (DASI).Therefore, based on ACC/AHA guidelines, patient would be at ACCEPTABLE risk for the planned procedure without further cardiovascular testing.   In review of the patient's medication reconciliation, it is noted that he is on daily oral antithrombotic therapy. He has been instructed on recommendations for holding his daily low-dose ASA for 7 days prior to his procedure with plans to restart as soon as postoperative bleeding risk felt to be minimized by his primary attending surgeon. The patient has been  instructed that his last dose should be on 04/06/2024.  Patient reports previous perioperative complications with anesthesia in the past. Patient has a PMH (+)  for PONV. Symptoms and history of PONV will be discussed with patient by anesthesia team on the day of her procedure. Interventions will be ordered as deemed necessary based on patient's individual care needs as determined by anesthesiologist. In review his EMR, it is noted that patient underwent a general anesthetic course here at [facility] ([facility]) in 07/2023 without documented complications.   MOST RECENT VITAL SIGNS:    03/07/2024   12:20 PM 02/17/2024    8:27 AM 02/03/2024    7:16 AM  Vitals with BMI  Height 5' 9  5' 9  Weight 175 lbs  173 lbs 6 oz  BMI 25.83  25.6  Systolic  138 130  Diastolic  56 70  Pulse  86 80   PROVIDERS/SPECIALISTS: NOTE: Primary physician provider listed below. Patient may have been seen by APP or partner within same practice.   PROVIDER ROLE / SPECIALTY LAST [place]ERLE[facility] Francisca Redell JAYSON, MD Urology (Surgeon) 01/21/2024  Glendia Shad, MD Primary Care Provider 02/17/2024  Darliss Redell, MD Cardiology 12/17/2023; preop APP call 03/03/2024  Zenaida Katz, MD Advanced Heart Failure 11/02/2023   ALLERGIES: Allergies  Allergen Reactions   Penicillins Hives    Childhood allergy (age 57)   Penicillin G     Other Reaction(s): Weal (disorder)    CURRENT HOME MEDICATIONS: No current facility-administered medications for this encounter.    ALPRAZolam  (XANAX ) 0.25 MG tablet   aspirin  EC 81 MG tablet   atorvastatin  (LIPITOR) 40 MG tablet   azelastine  (OPTIVAR ) 0.05 % ophthalmic solution   Azelastine -Fluticasone  137-50 MCG/ACT SUSP   carvedilol  (COREG ) 25 MG tablet   empagliflozin  (JARDIANCE ) 10 MG TABS tablet   fluorometholone (FML) 0.1 % ophthalmic suspension   gabapentin  (NEURONTIN ) 100 MG capsule   metFORMIN  (GLUCOPHAGE ) 1000 MG tablet   methocarbamol   (ROBAXIN ) 500 MG tablet   Multiple Vitamin (MULTIVITAMIN WITH MINERALS) TABS tablet   pantoprazole  (PROTONIX ) 40 MG tablet   pioglitazone  (ACTOS ) 15 MG tablet   sacubitril -valsartan  (ENTRESTO ) 49-51 MG   sertraline  (ZOLOFT ) 100 MG tablet   spironolactone  (ALDACTONE ) 25 MG tablet   tamsulosin  (FLOMAX ) 0.4 MG CAPS capsule   traZODone  (DESYREL ) 50 MG tablet   TRULICITY  3 MG/0.5ML SOAJ   ACCU-CHEK GUIDE TEST test strip   blood glucose meter kit and supplies   fluconazole  (DIFLUCAN ) 100 MG tablet   levocetirizine (XYZAL ) 5 MG tablet   montelukast  (SINGULAIR ) 10 MG tablet   testosterone  cypionate (DEPOTESTOSTERONE CYPIONATE) 200 MG/ML injection   HISTORY: Past Medical History:  Diagnosis Date   Anxiety    a.) on BZO (alprazolam ) PRN   Aortic atherosclerosis    BPH (benign prostatic hyperplasia)    C. difficile colitis    CAD (coronary artery disease)    Chronic heart failure with mildly reduced ejection fraction (HFmrEF, 41-49%) (HCC)    Coronary artery disease    Depression    Diverticulitis    Environmental allergies    GERD (gastroesophageal reflux disease)    Goiter    intrathoracic, s/p benign biopsy (Dr Eletha)   Hypercholesterolemia    Hypertension    Insomnia    a.) uses trazodone  PRN   Long-term use of aspirin  therapy    Male hypogonadism    a.) on exogenous TRT (depotestosterone cypionate)   NICM (nonischemic cardiomyopathy) (HCC)    OSA on CPAP    Osteoarthritis    cervical spine, lumbar spine   PAF (paroxysmal atrial fibrillation) (HCC) 2002   a.) CHA2DS2VASc =  8 (age x 2, HFmrEF,  HTN, CVA/TIA x 2, vascular disease, T2DM) as of 03/09/2024; b.) rate/rhythm maintained on oral carvedilol ; no chronic OAC   Paroxysmal atrial fibrillation (HCC)    Pneumonia    PONV (postoperative nausea and vomiting)    Status post bilateral cataract extraction    Stroke [facility])    T2DM (type 2 diabetes mellitus) (HCC)    TIA (transient ischemic attack)    Past Surgical History:   Procedure Laterality Date   BACK SURGERY  12/2006   s/p fusion of L4-L5   BACK SURGERY  2024   capsule endoscopy     CATARACT EXTRACTION  2011   Dr. Cleatus   CERVICAL DISC SURGERY  2002   CHOLECYSTECTOMY  2024   COLONOSCOPY WITH PROPOFOL  N/A 07/20/2017   Procedure: COLONOSCOPY WITH PROPOFOL ;  Surgeon: Viktoria Lamar DASEN, MD;  Location: [facility] ENDOSCOPY;  Service: Endoscopy;  Laterality: N/A;   COLONOSCOPY WITH PROPOFOL  N/A 07/27/2023   Procedure: COLONOSCOPY WITH PROPOFOL ;  Surgeon: Maryruth Ole DASEN, MD;  Location: [facility] ENDOSCOPY;  Service: Endoscopy;  Laterality: N/A;   EYE SURGERY     cataract bilateral 10/1999   eyelid reduction     Dr. Gretta   gallbladder removed  12/2022   INGUINAL HERNIA REPAIR  1991   Dr, Claudene   LEFT HEART CATH AND CORONARY ANGIOGRAPHY Left 06/02/2023   Procedure: LEFT HEART CATH AND CORONARY ANGIOGRAPHY;  Surgeon: Mady Bruckner, MD;  Location: [facility] INVASIVE CV LAB;  Service: Cardiovascular;  Laterality: Left;   LUMBAR LAMINECTOMY/DECOMPRESSION MICRODISCECTOMY N/A 10/15/2020   Procedure: Laminectomy - Lumbar Two-Lumbar Three - Lumbar Three-Lumbar Four with sublaminar decompression;  Surgeon: Joshua Alm RAMAN, MD;  Location[facility] OR;  Service: Neurosurgery;  Laterality: N/A;  Laminectomy - Lumbar Two-Lumbar Three - Lumbar Three-Lumbar Four with sublaminar decompression   NOSE SURGERY     turbinate reduction   SEPTOPLASTY  1975   SHOULDER SURGERY  2000   rotator cuff   spinal stimulator     Family History  Problem Relation Age of Onset   Congestive Heart Failure Father    Heart disease Father        myocardial infarction   Rheumatic fever Father        valvular disease   Heart disease Mother        s/p CABG (age 79)   Kidney disease Sister    Colon cancer Neg Hx    Prostate cancer Neg Hx    Social History   Tobacco Use   Smoking status: Former    Passive exposure: Never   Smokeless tobacco: Never  Substance Use Topics   Alcohol use: Yes     Alcohol/week: 0.0 standard drinks of alcohol    Comment: occasional   LABS:  Lab Results  Component Value Date   WBC 15.5 (H) 03/07/2024   HGB 10.5 (L) 03/07/2024   HCT 34.1 (L) 03/07/2024   MCV 80.8 03/07/2024   PLT 362 03/07/2024   Lab Results  Component Value Date   NA 140 01/29/2024   CL 101 01/29/2024   K 4.7 01/29/2024   CO2 28 01/29/2024   BUN 20 01/29/2024   CREATININE 0.76 01/29/2024   GFR 85.39 01/29/2024   CALCIUM  9.4 01/29/2024   ALBUMIN 3.8 01/29/2024   GLUCOSE 117 (H) 01/29/2024    ECG: Date: 03/07/2024  Time ECG obtained: 1419 PM Rate: 71 bpm Rhythm: Sinus rhythm with marked sinus arrhythmia with first-degree AV block; nonspecific IVCD Axis (leads I  and aVF): normal Intervals: PR 224 ms. QRS 132 ms. QTc 425 ms. ST segment and T wave changes: Inferolateral T wave abnormality  Evidence of a possible, age undetermined, prior infarct:  No Comparison: Similar to previous tracing obtained on 07/07/2023   IMAGING / PROCEDURES: TRANSTHORACIC ECHOCARDIOGRAM performed on 09/10/2023 Left ventricular ejection fraction, by estimation, is 30 to 35%. Left ventricular ejection fraction by PLAX is 31 %. The left ventricle has moderately decreased function. The left ventricle demonstrates global hypokinesis. There is moderate left ventricular hypertrophy. Left ventricular diastolic parameters are consistent with Grade I diastolic dysfunction (impaired relaxation).  Right ventricular systolic function is normal. The right ventricular size is normal. Tricuspid regurgitation signal is inadequate for assessing PA pressure.  The mitral valve is normal in structure. Mild mitral valve regurgitation. No evidence of mitral stenosis.  The aortic valve is tricuspid. Aortic valve regurgitation is not visualized. No aortic stenosis is present.  The inferior vena cava is normal in size with greater than 50% respiratory variability, suggesting right atrial pressure of 3 mmHg.   LEFT HEART  CATHETERIZATION AND CORONARY ANGIOGRAPHY performed on 06/02/2023 Moderate two-vessel coronary artery disease, as detailed below, with 50-60% mid LAD stenosis and 40-50% proximal/mid dominant LCx lesion. Normal left ventricular filling pressure (LVEDP 14 mmHg). Recommendations: Continue escalation of goal-directed medical therapy in the setting of nonischemic cardiomyopathy. Continue medical therapy and risk factor modification to prevent progression of coronary artery disease.  If the patient were to develop angina, functional study could be considered to assess the functional significance of LAD and LCx disease.   LONG TERM CARDIAC EVENT MONITOR STUDY performed on 04/24/2023 Patch Wear Time:  13 days and 23 hours (2024-11-22T10:52:08-498 to 2024-12-06T10:52:00-0500) Patient had a min HR of 60 bpm, max HR of 176 bpm, and avg HR of 78 bpm.  Predominant underlying rhythm was Sinus Rhythm.  First Degree AV Block was present.  Bundle Branch Block/IVCD was present. 3 Ventricular Tachycardia runs occurred, the run with the  fastest interval lasting 13.3 secs with a max rate of 176 bpm (avg 132 bpm); the run with the fastest interval was also the longest.  4 Supraventricular Tachycardia runs occurred, the run with the fastest interval lasting 16 beats with a max rate of 126  bpm (avg 108 bpm); the run with the fastest interval was also the longest.  Isolated SVEs were rare (<1.0%), SVE Couplets were rare (<1.0%), and no SVE Triplets were present.  Isolated VEs were rare (<1.0%), VE Couplets were rare (<1.0%), and no VE Triplets were present. Ventricular Bigeminy and Trigeminy were present. No atrial fibrillation or atrial flutter.  IMPRESSION AND PLAN: Ricardo Nash has been referred for pre-anesthesia review and clearance prior to him undergoing the planned anesthetic and procedural courses. Available labs, pertinent testing, and imaging results were personally reviewed by me in preparation for  upcoming operative/procedural course. [facility] medical record has been updated following extensive record review and patient interview with PAT staff.   ATTENTION --> PENDING CLEARANCE AT THIS TIME -- NOTE/CONTENTS NOT FINAL UNTIL SIGNED This patient has been appropriately cleared by cardiology with an overall *** risk of patient experiencing significant perioperative cardiovascular complications. Based on clinical review performed today (03/10/24), barring any significant acute changes in the patient's overall condition, it is anticipated that he will be able to proceed with the planned surgical intervention. Any acute changes in clinical condition may necessitate his procedure being postponed and/or cancelled. Patient will meet with anesthesia team (MD and/or CRNA)  on the day of his procedure for preoperative evaluation/assessment. Questions regarding anesthetic course will be fielded at that time.   Pre-surgical instructions were reviewed with the patient during his PAT appointment, and questions were fielded to satisfaction by PAT clinical staff. He has been instructed on which medications that he will need to hold prior to surgery, as well as the ones that have been deemed safe/appropriate to take on the day of his procedure. As part of the general education provided by PAT, patient made aware both verbally and in writing, that he would need to abstain from the use of any illegal substances during his perioperative course. He was advised that failure to follow the provided instructions could necessitate case cancellation or result in serious perioperative complications up to and including death. Patient encouraged to contact PAT and/or his surgeon's office to discuss any questions or concerns that may arise prior to surgery; verbalized understanding.   Dorise Pereyra, MSN, APRN, FNP-C, CEN [facility] [place]  Perioperative Services Nurse Practitioner Phone: [phone] Fax: [phone] 03/10/24 10:00 AM  NOTE: This note has been prepared using Dragon dictation software. Despite my best ability to proofread, there is always the potential that unintentional transcriptional errors may still occur from this process.

## 2024-03-11 ENCOUNTER — Encounter: Payer: Self-pay | Admitting: Urology

## 2024-03-14 ENCOUNTER — Ambulatory Visit
Admission: RE | Admit: 2024-03-14 | Discharge: 2024-03-14 | Disposition: A | Discharge status: Home / Self Care | Attending: Urology | Admitting: Urology

## 2024-03-14 ENCOUNTER — Encounter
Admission: RE | Disposition: A | Discharge status: Home / Self Care | Payer: Self-pay | Source: Home / Self Care | Attending: Urology

## 2024-03-14 ENCOUNTER — Ambulatory Visit: Payer: Self-pay | Admitting: Urgent Care

## 2024-03-14 ENCOUNTER — Encounter: Payer: Self-pay | Admitting: Urology

## 2024-03-14 DIAGNOSIS — E1159 Type 2 diabetes mellitus with other circulatory complications: Secondary | ICD-10-CM

## 2024-03-14 DIAGNOSIS — I2510 Atherosclerotic heart disease of native coronary artery without angina pectoris: Secondary | ICD-10-CM | POA: Diagnosis not present

## 2024-03-14 DIAGNOSIS — N138 Other obstructive and reflux uropathy: Secondary | ICD-10-CM | POA: Insufficient documentation

## 2024-03-14 DIAGNOSIS — E119 Type 2 diabetes mellitus without complications: Secondary | ICD-10-CM | POA: Insufficient documentation

## 2024-03-14 DIAGNOSIS — F419 Anxiety disorder, unspecified: Secondary | ICD-10-CM | POA: Diagnosis not present

## 2024-03-14 DIAGNOSIS — N401 Enlarged prostate with lower urinary tract symptoms: Secondary | ICD-10-CM | POA: Insufficient documentation

## 2024-03-14 DIAGNOSIS — Z6825 Body mass index (BMI) 25.0-25.9, adult: Secondary | ICD-10-CM | POA: Insufficient documentation

## 2024-03-14 DIAGNOSIS — C61 Malignant neoplasm of prostate: Secondary | ICD-10-CM | POA: Insufficient documentation

## 2024-03-14 DIAGNOSIS — Z87891 Personal history of nicotine dependence: Secondary | ICD-10-CM | POA: Diagnosis not present

## 2024-03-14 DIAGNOSIS — I428 Other cardiomyopathies: Secondary | ICD-10-CM | POA: Insufficient documentation

## 2024-03-14 DIAGNOSIS — D509 Iron deficiency anemia, unspecified: Secondary | ICD-10-CM | POA: Diagnosis not present

## 2024-03-14 DIAGNOSIS — Z7984 Long term (current) use of oral hypoglycemic drugs: Secondary | ICD-10-CM | POA: Insufficient documentation

## 2024-03-14 DIAGNOSIS — Z8673 Personal history of transient ischemic attack (TIA), and cerebral infarction without residual deficits: Secondary | ICD-10-CM | POA: Diagnosis not present

## 2024-03-14 DIAGNOSIS — E7800 Pure hypercholesterolemia, unspecified: Secondary | ICD-10-CM | POA: Diagnosis not present

## 2024-03-14 DIAGNOSIS — K219 Gastro-esophageal reflux disease without esophagitis: Secondary | ICD-10-CM | POA: Insufficient documentation

## 2024-03-14 DIAGNOSIS — G4733 Obstructive sleep apnea (adult) (pediatric): Secondary | ICD-10-CM | POA: Insufficient documentation

## 2024-03-14 DIAGNOSIS — I5020 Unspecified systolic (congestive) heart failure: Secondary | ICD-10-CM | POA: Diagnosis not present

## 2024-03-14 DIAGNOSIS — I110 Hypertensive heart disease with heart failure: Secondary | ICD-10-CM | POA: Insufficient documentation

## 2024-03-14 DIAGNOSIS — E66813 Obesity, class 3: Secondary | ICD-10-CM | POA: Insufficient documentation

## 2024-03-14 DIAGNOSIS — I5022 Chronic systolic (congestive) heart failure: Secondary | ICD-10-CM | POA: Diagnosis not present

## 2024-03-14 DIAGNOSIS — Z8249 Family history of ischemic heart disease and other diseases of the circulatory system: Secondary | ICD-10-CM | POA: Insufficient documentation

## 2024-03-14 DIAGNOSIS — I480 Paroxysmal atrial fibrillation: Secondary | ICD-10-CM | POA: Diagnosis not present

## 2024-03-14 HISTORY — DX: Other ventricular tachycardia: I47.29

## 2024-03-14 HISTORY — DX: Cataract extraction status, right eye: Z98.41

## 2024-03-14 HISTORY — DX: Obstructive sleep apnea (adult) (pediatric): G47.33

## 2024-03-14 HISTORY — DX: Left bundle-branch block, unspecified: I44.7

## 2024-03-14 HISTORY — DX: Other intervertebral disc degeneration, lumbar region without mention of lumbar back pain or lower extremity pain: M51.369

## 2024-03-14 HISTORY — PX: HOLEP-LASER ENUCLEATION OF THE PROSTATE WITH MORCELLATION: SHX6641

## 2024-03-14 HISTORY — DX: Other obstructive and reflux uropathy: N13.8

## 2024-03-14 HISTORY — DX: Atherosclerosis of aorta: I70.0

## 2024-03-14 HISTORY — DX: Anxiety disorder, unspecified: F41.9

## 2024-03-14 HISTORY — DX: Insomnia, unspecified: G47.00

## 2024-03-14 HISTORY — DX: Other cerebrovascular disease: I67.89

## 2024-03-14 HISTORY — DX: Supraventricular tachycardia, unspecified: I47.10

## 2024-03-14 HISTORY — DX: Benign prostatic hyperplasia without lower urinary tract symptoms: N40.0

## 2024-03-14 HISTORY — DX: Long term (current) use of aspirin: Z79.82

## 2024-03-14 HISTORY — DX: Unspecified systolic (congestive) heart failure: I50.20

## 2024-03-14 HISTORY — DX: Type 2 diabetes mellitus without complications: E11.9

## 2024-03-14 HISTORY — DX: Benign prostatic hyperplasia with lower urinary tract symptoms: N13.8

## 2024-03-14 HISTORY — DX: Cataract extraction status, left eye: Z98.41

## 2024-03-14 HISTORY — DX: Testicular hypofunction: E29.1

## 2024-03-14 LAB — GLUCOSE, CAPILLARY
Glucose-Capillary: 133 mg/dL — ABNORMAL HIGH (ref 70–99)
Glucose-Capillary: 133 mg/dL — ABNORMAL HIGH (ref 70–99)

## 2024-03-14 SURGERY — ENUCLEATION, PROSTATE, USING LASER, WITH MORCELLATION
Anesthesia: General | Site: Prostate

## 2024-03-14 MED ORDER — FENTANYL CITRATE (PF) 100 MCG/2ML IJ SOLN
INTRAMUSCULAR | Status: DC | PRN
Start: 2024-03-14 — End: 2024-03-14
  Administered 2024-03-14 (×2): 50 ug via INTRAVENOUS

## 2024-03-14 MED ORDER — ORAL CARE MOUTH RINSE
15.0000 mL | Freq: Once | OROMUCOSAL | Status: AC
Start: 2024-03-14 — End: 2024-03-14

## 2024-03-14 MED ORDER — CHLORHEXIDINE GLUCONATE 0.12 % MT SOLN
OROMUCOSAL | Status: AC
  Filled 2024-03-14: qty 15

## 2024-03-14 MED ORDER — ACETAMINOPHEN 10 MG/ML IV SOLN
INTRAVENOUS | Status: DC | PRN
  Administered 2024-03-14: 1000 mg via INTRAVENOUS

## 2024-03-14 MED ORDER — CHLORHEXIDINE GLUCONATE 0.12 % MT SOLN
15.0000 mL | Freq: Once | OROMUCOSAL | Status: AC
  Administered 2024-03-14: 15 mL via OROMUCOSAL

## 2024-03-14 MED ORDER — CIPROFLOXACIN IN D5W 400 MG/200ML IV SOLN
INTRAVENOUS | Status: AC
Start: 2024-03-14 — End: 2024-03-14
  Filled 2024-03-14: qty 200

## 2024-03-14 MED ORDER — FENTANYL CITRATE (PF) 100 MCG/2ML IJ SOLN: 25.0000 ug | INTRAMUSCULAR | Status: DC | PRN

## 2024-03-14 MED ORDER — PROPOFOL 10 MG/ML IV BOLUS
INTRAVENOUS | Status: DC | PRN
Start: 2024-03-14 — End: 2024-03-14
  Administered 2024-03-14: 100 mg via INTRAVENOUS

## 2024-03-14 MED ORDER — OXYCODONE HCL 5 MG PO TABS
ORAL_TABLET | ORAL | Status: AC
  Filled 2024-03-14: qty 1

## 2024-03-14 MED ORDER — SUGAMMADEX SODIUM 200 MG/2ML IV SOLN
INTRAVENOUS | Status: DC | PRN
  Administered 2024-03-14: 200 mg via INTRAVENOUS

## 2024-03-14 MED ORDER — ROCURONIUM BROMIDE 10 MG/ML (PF) SYRINGE
PREFILLED_SYRINGE | INTRAVENOUS | Status: DC | PRN
Start: 2024-03-14 — End: 2024-03-14
  Administered 2024-03-14: 50 mg via INTRAVENOUS

## 2024-03-14 MED ORDER — ONDANSETRON HCL 4 MG/2ML IJ SOLN
INTRAMUSCULAR | Status: DC | PRN
Start: 2024-03-14 — End: 2024-03-14
  Administered 2024-03-14: 4 mg via INTRAVENOUS

## 2024-03-14 MED ORDER — STERILE WATER FOR IRRIGATION IR SOLN
Status: DC | PRN
  Administered 2024-03-14: 1000 mL

## 2024-03-14 MED ORDER — SODIUM CHLORIDE 0.9 % IR SOLN
Status: DC | PRN
  Administered 2024-03-14: 15000 mL

## 2024-03-14 MED ORDER — ONDANSETRON HCL 4 MG/2ML IJ SOLN: 4.0000 mg | Freq: Once | INTRAMUSCULAR | Status: DC | PRN

## 2024-03-14 MED ORDER — SODIUM CHLORIDE 0.9 % IV SOLN: INTRAVENOUS | Status: DC

## 2024-03-14 MED ORDER — FENTANYL CITRATE (PF) 100 MCG/2ML IJ SOLN
INTRAMUSCULAR | Status: AC
  Filled 2024-03-14: qty 2

## 2024-03-14 MED ORDER — LACTATED RINGERS IV SOLN
INTRAVENOUS | Status: DC | PRN
Start: 2024-03-14 — End: 2024-03-14

## 2024-03-14 MED ORDER — LIDOCAINE HCL (PF) 2 % IJ SOLN
INTRAMUSCULAR | Status: DC | PRN
  Administered 2024-03-14: 100 mg via INTRADERMAL

## 2024-03-14 MED ORDER — TRAMADOL HCL 50 MG PO TABS: 25.0000 mg | ORAL_TABLET | Freq: Four times a day (QID) | ORAL | 0 refills | Status: AC | PRN

## 2024-03-14 MED ORDER — ACETAMINOPHEN 10 MG/ML IV SOLN
INTRAVENOUS | Status: AC
  Filled 2024-03-14: qty 100

## 2024-03-14 MED ORDER — DEXAMETHASONE SOD PHOSPHATE PF 10 MG/ML IJ SOLN
INTRAMUSCULAR | Status: DC | PRN
Start: 2024-03-14 — End: 2024-03-14
  Administered 2024-03-14: 4 mg via INTRAVENOUS

## 2024-03-14 MED ORDER — CIPROFLOXACIN IN D5W 400 MG/200ML IV SOLN
400.0000 mg | INTRAVENOUS | Status: AC
  Administered 2024-03-14: 400 mg via INTRAVENOUS

## 2024-03-14 MED ORDER — OXYCODONE HCL 5 MG PO TABS
5.0000 mg | ORAL_TABLET | Freq: Once | ORAL | Status: AC
  Administered 2024-03-14: 5 mg via ORAL

## 2024-03-14 SURGICAL SUPPLY — 24 items
ADAPTER IRRIG TUBE 2 SPIKE SOL (ADAPTER) ×2 IMPLANT
BAG URO DRAIN 4000ML (MISCELLANEOUS) ×1 IMPLANT
CATH URETL OPEN END 4X70 (CATHETERS) ×1 IMPLANT
CATH URTH STD 24FR FL 3W 2 (CATHETERS) ×1 IMPLANT
CONTAINER COLLECT MORCELLATR (MISCELLANEOUS) ×1 IMPLANT
DRAPE UTILITY 15X26 TOWEL STRL (DRAPES) IMPLANT
FIBER LASER MOSES 550 DFL (Laser) ×1 IMPLANT
FILTER OVERFLOW MORCELLATOR (FILTER) ×1 IMPLANT
GLOVE BIOGEL PI IND STRL 7.5 (GLOVE) ×1 IMPLANT
GOWN STRL REUS W/ TWL LRG LVL3 (GOWN DISPOSABLE) ×1 IMPLANT
GOWN STRL REUS W/ TWL XL LVL3 (GOWN DISPOSABLE) ×1 IMPLANT
HOLDER FOLEY CATH W/STRAP (MISCELLANEOUS) ×1 IMPLANT
KIT TURNOVER CYSTO (KITS) ×1 IMPLANT
MEMBRANE SLNG YLW 17 FOR INST (MISCELLANEOUS) ×1 IMPLANT
MORCELLATOR ROTATION 4.75 335 (MISCELLANEOUS) ×1 IMPLANT
PACK CYSTO AR (MISCELLANEOUS) ×1 IMPLANT
SET CYSTO IRRIGATION (SET/KITS/TRAYS/PACK) ×1 IMPLANT
SET IRRIG Y TYPE TUR BLADDER L (SET/KITS/TRAYS/PACK) ×1 IMPLANT
SLEEVE PROTECTION STRL DISP (MISCELLANEOUS) ×2 IMPLANT
SOL .9 NS 3000ML IRR UROMATIC (IV SOLUTION) ×5 IMPLANT
SOLN STERILE WATER BTL 1000 ML (IV SOLUTION) ×1 IMPLANT
SURGILUBE 2OZ TUBE FLIPTOP (MISCELLANEOUS) ×1 IMPLANT
SYRINGE TOOMEY IRRIG 70ML (MISCELLANEOUS) ×1 IMPLANT
TUBE PUMP MORCELLATOR PIRANHA (TUBING) ×1 IMPLANT

## 2024-03-14 NOTE — Op Note (Signed)
 Date of procedure: 03/14/24  Preoperative diagnosis:  BPH with obstruction  Postoperative diagnosis:  Same  Procedure: HoLEP (Holmium Laser Enucleation of the Prostate)  Surgeon: Redell Burnet, MD  Anesthesia: General  Complications: None  Intraoperative findings:  Large prostate with obstructive lateral lobes and median lobe Moderate to severe trabeculations Uncomplicated HOLEP, ureteral orifices and verumontanum intact at conclusion of case  EBL: Minimal  Specimens: Prostate chips  Enucleation time: 14 minutes  Morcellation time: 4 minutes  Intra-op weight: 42g  Drains: 24 French three-way, 60 cc in balloon  Indication: [name] is a 78 y.o. patient with BPH and obstructive urinary symptoms despite alpha blockers and incomplete emptying who opted for HOLEP.  After reviewing the management options for treatment, they elected to proceed with the above surgical procedure(s). We have discussed the potential benefits and risks of the procedure, side effects of the proposed treatment, the likelihood of the patient achieving the goals of the procedure, and any potential problems that might occur during the procedure or recuperation.  We specifically discussed the risks of bleeding, infection, hematuria and clot retention, need for additional procedures, possible overnight hospital stay, temporary urgency and incontinence, rare long-term incontinence, and retrograde ejaculation.  Informed consent has been obtained.   Description of procedure:  The patient was taken to the operating room and general anesthesia was induced.  The patient was placed in the dorsal lithotomy position, prepped and draped in the usual sterile fashion, and preoperative antibiotics were administered.  SCDs were placed for DVT prophylaxis.  A preoperative time-out was performed.   Fleeta Needs sounds were used to gently dilated the urethra up to 57F. The 61 French continuous flow resectoscope was  inserted into the urethra using the visual obturator  The prostate was large with obstructing lateral lobes, median lobe, and elevated bladder neck. The bladder was thoroughly inspected and notable for moderate to severe trabeculations.  The ureteral orifices were located in orthotopic position.    The laser was set to 2 J and 60 Hz and early apical release was performed by making a circumferential mucosal incision proximal to the sphincter.  A lambda incision was then made proximal to the verumontanum.  The prostate was enucleated en bloc circumferentially into the bladder.  The capsule was examined and laser was used for meticulous hemostasis.    The 63 French resectoscope was then switched out for the 26 French nephroscope and prostate tissue was morcellated(Piranha) and the tissue sent to pathology.  A 24 French three-way catheter was inserted easily with the aid of a catheter guide, and 60 cc were placed in the balloon.  Urine was clear.  The catheter irrigated easily with a Toomey syringe.  CBI was initiated.   The patient tolerated the procedure well without any immediate complications and was extubated and transferred to the recovery room in stable condition.  Urine was clear on fast CBI.  Disposition: Stable to PACU  Plan: Wean CBI in PACU, anticipate discharge home today with Foley removal in clinic in 2-3 days  Redell Burnet, MD 03/14/2024

## 2024-03-14 NOTE — H&P (Signed)
 03/14/24 10:04 AM   Ricardo Nash 02/23/1946 [id number]  CC: BPH with obstruction  HPI: 78 year old comorbid male with long history of urinary symptoms on Flomax , worsening symptoms with incomplete emptying and obstructive symptoms, prostate measured 80 g, PSA normal, opted for HOLEP.   PMH: Past Medical History:  Diagnosis Date   Anxiety    a.) on BZO (alprazolam ) PRN   Aortic atherosclerosis    BPH with urinary obstruction    C. difficile colitis    CAD (coronary artery disease)    Cerebral microvascular disease    Coronary artery disease    DDD (degenerative disc disease), lumbar    a.) s/p lumbar decompression and fusion (L4-5); BILATERAL partial facetectomy and foraminotomies.   Depression    Diverticulitis    Environmental allergies    GERD (gastroesophageal reflux disease)    Goiter    intrathoracic, s/p benign biopsy (Dr Eletha)   HFrEF (heart failure with reduced ejection fraction) (HCC)    Hypercholesterolemia    Hypertension    Implantable loop recorder present 06/2023   Insomnia    a.) uses trazodone  PRN   LBBB (left bundle branch block)    Long-term use of aspirin  therapy    Male hypogonadism    a.) on exogenous TRT (depotestosterone cypionate)   NICM (nonischemic cardiomyopathy) (HCC)    NSVT (nonsustained ventricular tachycardia) (HCC)    OSA on CPAP    Osteoarthritis    cervical spine, lumbar spine   PAF (paroxysmal atrial fibrillation) (HCC) 2002   a.) Dx'd 2002 - likely lone A.fib per cardiology; b.) CHA2DS2VASc = 8 (age x 2, HFrEF,  HTN, CVA/TIA x 2, vascular disease, T2DM) as of 03/09/2024; c.) rate/rhythm maintained on oral carvedilol ; no chronic OAC   Pneumonia    PONV (postoperative nausea and vomiting)    PSVT (paroxysmal supraventricular tachycardia)    Status post bilateral cataract extraction    Stroke (HCC)    T2DM (type 2 diabetes mellitus) (HCC)    TIA (transient ischemic attack)     Surgical History: Past Surgical  History:  Procedure Laterality Date   BACK SURGERY  12/2006   s/p fusion of L4-L5   BACK SURGERY  2024   capsule endoscopy     CATARACT EXTRACTION  2011   Dr. Cleatus   CERVICAL DISC SURGERY  2002   CHOLECYSTECTOMY  2024   COLONOSCOPY WITH PROPOFOL  N/A 07/20/2017   Procedure: COLONOSCOPY WITH PROPOFOL ;  Surgeon: Viktoria Lamar DASEN, MD;  Location: [facility] ENDOSCOPY;  Service: Endoscopy;  Laterality: N/A;   COLONOSCOPY WITH PROPOFOL  N/A 07/27/2023   Procedure: COLONOSCOPY WITH PROPOFOL ;  Surgeon: Maryruth Ole DASEN, MD;  Location: [facility] ENDOSCOPY;  Service: Endoscopy;  Laterality: N/A;   EYE SURGERY     cataract bilateral 10/1999   eyelid reduction     Dr. Gretta   gallbladder removed  12/2022   INGUINAL HERNIA REPAIR  1991   Dr, Claudene   LEFT HEART CATH AND CORONARY ANGIOGRAPHY Left 06/02/2023   Procedure: LEFT HEART CATH AND CORONARY ANGIOGRAPHY;  Surgeon: Mady Bruckner, MD;  Location: [facility] INVASIVE CV LAB;  Service: Cardiovascular;  Laterality: Left;   LUMBAR LAMINECTOMY/DECOMPRESSION MICRODISCECTOMY N/A 10/15/2020   Procedure: Laminectomy - Lumbar Two-Lumbar Three - Lumbar Three-Lumbar Four with sublaminar decompression;  Surgeon: Joshua Alm RAMAN, MD;  Location: [facility] OR;  Service: Neurosurgery;  Laterality: N/A;  Laminectomy - Lumbar Two-Lumbar Three - Lumbar Three-Lumbar Four with sublaminar decompression   NOSE SURGERY     turbinate reduction  SEPTOPLASTY  1975   SHOULDER SURGERY  2000   rotator cuff   spinal stimulator      Family History: Family History  Problem Relation Age of Onset   Congestive Heart Failure Father    Heart disease Father        myocardial infarction   Rheumatic fever Father        valvular disease   Heart disease Mother        s/p CABG (age 21)   Kidney disease Sister    Colon cancer Neg Hx    Prostate cancer Neg Hx     Social History:  reports that he has quit smoking. He has never been exposed to tobacco smoke. He has never used smokeless tobacco.  He reports current alcohol use. He reports that he does not use drugs.  Physical Exam:  Constitutional:  Alert and oriented, No acute distress. Cardiovascular: Regular rate and rhythm Respiratory: Clear to auscultation bilaterally GI: Abdomen is soft, nontender, nondistended, no abdominal masses  Laboratory Data: Urine culture with Candida, treated with fluconazole   Assessment & Plan:   78 year old male with BPH and obstructive symptoms despite medications, incomplete emptying, opted for HOLEP for definitive treatment.  We discussed the risks and benefits of HoLEP at length.  The procedure requires general anesthesia and takes 1 to 2 hours, and a holmium laser is used to enucleate the prostate and push this tissue into the bladder.  A morcellator is then used to remove this tissue, which is sent for pathology.  The vast majority(>95%) of patients are able to discharge the same day with a catheter in place for 2 to 3 days, and will follow-up in clinic for a voiding trial.  We specifically discussed the risks of bleeding, infection, retrograde ejaculation, temporary urgency and urge incontinence, very low risk of long-term incontinence, urethral stricture/bladder neck contracture, pathologic evaluation of prostate tissue and possible detection of prostate cancer or other malignancy, and possible need for additional procedures.  HOLEP today  Redell Burnet, MD 03/14/2024  [facility] [address] [place], [place] [address] [phone]

## 2024-03-14 NOTE — Progress Notes (Unsigned)
   Patient underwent HoLEP with Dr. Francisca on 03/14/2024.  His postoperative course has been as expected and uneventful.   Reviewed post operative course following HoLEP of temporary worsening of irritative voiding symptoms, SUI and retrograde ejaculation.  Surgical pathology still pending   Patient is present today for a catheter removal.  60 ml of water was drained from the balloon. A 24 3-way FR foley cath was removed from the bladder no complications were noted . Patient tolerated well.  Performed by: CLOTILDA CORNWALL, PA-C   Follow up/ Additional notes: Follow-up in February as scheduled with Dr. Francisca

## 2024-03-14 NOTE — Transfer of Care (Signed)
 Immediate Anesthesia Transfer of Care Note  Patient: Gladys Gutman Moeser  Procedure(s) Performed: ENUCLEATION, PROSTATE, USING LASER, WITH MORCELLATION (Prostate)  Patient Location: PACU  Anesthesia Type:General  Level of Consciousness: drowsy  Airway & Oxygen Therapy: Patient Spontanous Breathing and Patient connected to face mask oxygen  Post-op Assessment: Report given to RN, Post -op Vital signs reviewed and stable, and Patient moving all extremities  Post vital signs: Reviewed and stable  Last Vitals:  Vitals Value Taken Time  BP 124/52 03/14/24 11:23  Temp 36 C 03/14/24 11:23  Pulse 79 03/14/24 11:25  Resp 17 03/14/24 11:25  SpO2 100 % 03/14/24 11:25  Vitals shown include unfiled device data.  Last Pain:  Vitals:   03/14/24 1013  TempSrc: Tympanic  PainSc: 0-No pain         Complications: No notable events documented.

## 2024-03-14 NOTE — Anesthesia Procedure Notes (Signed)
 Procedure Name: Intubation Date/Time: 03/14/2024 10:34 AM  Performed by: Myra Lawless, CRNAPre-anesthesia Checklist: Patient identified, Patient being monitored, Timeout performed, Emergency Drugs available and Suction available Patient Re-evaluated:Patient Re-evaluated prior to induction Oxygen Delivery Method: Circle system utilized Preoxygenation: Pre-oxygenation with 100% oxygen Induction Type: IV induction Ventilation: Mask ventilation without difficulty Laryngoscope Size: Mac and 3 Grade View: Grade I Tube type: Oral Tube size: 7.5 mm Number of attempts: 1 Airway Equipment and Method: Stylet Placement Confirmation: ETT inserted through vocal cords under direct vision, positive ETCO2 and breath sounds checked- equal and bilateral Secured at: 22 cm Tube secured with: Tape Dental Injury: Teeth and Oropharynx as per pre-operative assessment

## 2024-03-14 NOTE — Anesthesia Preprocedure Evaluation (Signed)
 Anesthesia Evaluation  Patient identified by MRN, date of birth, ID band Patient awake    Reviewed: Allergy & Precautions, NPO status , Patient's Chart, lab work & pertinent test results  History of Anesthesia Complications (+) PONV and history of anesthetic complications  Airway Mallampati: II  TM Distance: >3 FB Neck ROM: full    Dental  (+) Teeth Intact   Pulmonary neg pulmonary ROS, sleep apnea , Patient abstained from smoking., former smoker   Pulmonary exam normal breath sounds clear to auscultation       Cardiovascular hypertension, Pt. on medications + CAD  negative cardio ROS Normal cardiovascular exam+ dysrhythmias Atrial Fibrillation  Rhythm:Regular     Neuro/Psych   Anxiety     CVA negative neurological ROS  negative psych ROS   GI/Hepatic negative GI ROS, Neg liver ROS,GERD  ,,  Endo/Other  negative endocrine ROSdiabetes, Type 2, Oral Hypoglycemic Agents  Class 3 obesity  Renal/GU   negative genitourinary   Musculoskeletal   Abdominal  (+) + obese  Peds  Hematology negative hematology ROS (+) Blood dyscrasia, anemia   Anesthesia Other Findings Past Medical History: No date: Anxiety     Comment:  a.) on BZO (alprazolam ) PRN No date: Aortic atherosclerosis No date: BPH with urinary obstruction No date: C. difficile colitis No date: CAD (coronary artery disease) No date: Cerebral microvascular disease No date: Coronary artery disease No date: DDD (degenerative disc disease), lumbar     Comment:  a.) s/p lumbar decompression and fusion (L4-5);               BILATERAL partial facetectomy and foraminotomies. No date: Depression No date: Diverticulitis No date: Environmental allergies No date: GERD (gastroesophageal reflux disease) No date: Goiter     Comment:  intrathoracic, s/p benign biopsy (Dr Eletha) No date: HFrEF (heart failure with reduced ejection fraction) (HCC) No date:  Hypercholesterolemia No date: Hypertension 06/2023: Implantable loop recorder present No date: Insomnia     Comment:  a.) uses trazodone  PRN No date: LBBB (left bundle branch block) No date: Long-term use of aspirin  therapy No date: Male hypogonadism     Comment:  a.) on exogenous TRT (depotestosterone cypionate) No date: NICM (nonischemic cardiomyopathy) (HCC) No date: NSVT (nonsustained ventricular tachycardia) (HCC) No date: OSA on CPAP No date: Osteoarthritis     Comment:  cervical spine, lumbar spine 2002: PAF (paroxysmal atrial fibrillation) (HCC)     Comment:  a.) Dx'd 2002 - likely lone A.fib per cardiology; b.)               CHA2DS2VASc = 8 (age x 2, HFrEF,  HTN, CVA/TIA x 2,               vascular disease, T2DM) as of 03/09/2024; c.) rate/rhythm              maintained on oral carvedilol ; no chronic OAC No date: Pneumonia No date: PONV (postoperative nausea and vomiting) No date: PSVT (paroxysmal supraventricular tachycardia) No date: Status post bilateral cataract extraction No date: Stroke [facility]) No date: T2DM (type 2 diabetes mellitus) (HCC) No date: TIA (transient ischemic attack)  Past Surgical History: 12/2006: BACK SURGERY     Comment:  s/p fusion of L4-L5 2024: BACK SURGERY No date: capsule endoscopy 2011: CATARACT EXTRACTION     Comment:  Dr. Cleatus 2002: CERVICAL DISC SURGERY 2024: CHOLECYSTECTOMY 07/20/2017: COLONOSCOPY WITH PROPOFOL ; N/A     Comment:  Procedure: COLONOSCOPY WITH PROPOFOL ;  Surgeon: Viktoria,  Lamar DASEN, MD;  Location: [facility] ENDOSCOPY;  Service:               Endoscopy;  Laterality: N/A; 07/27/2023: COLONOSCOPY WITH PROPOFOL ; N/A     Comment:  Procedure: COLONOSCOPY WITH PROPOFOL ;  Surgeon:               Maryruth Ole DASEN, MD;  Location: [facility] ENDOSCOPY;                Service: Endoscopy;  Laterality: N/A; No date: EYE SURGERY     Comment:  cataract bilateral 10/1999 No date: eyelid reduction     Comment:  Dr. Gretta 12/2022:  gallbladder removed 1991: INGUINAL HERNIA REPAIR     Comment:  Dr, Claudene 06/02/2023: LEFT HEART CATH AND CORONARY ANGIOGRAPHY; Left     Comment:  Procedure: LEFT HEART CATH AND CORONARY ANGIOGRAPHY;                Surgeon: Mady Bruckner, MD;  Location: [facility] INVASIVE               CV LAB;  Service: Cardiovascular;  Laterality: Left; 10/15/2020: LUMBAR LAMINECTOMY/DECOMPRESSION MICRODISCECTOMY; N/A     Comment:  Procedure: Laminectomy - Lumbar Two-Lumbar Three -               Lumbar Three-Lumbar Four with sublaminar decompression;                Surgeon: Joshua Alm RAMAN, MD;  Location: [facility] OR;  Service:               Neurosurgery;  Laterality: N/A;  Laminectomy - Lumbar               Two-Lumbar Three - Lumbar Three-Lumbar Four with               sublaminar decompression No date: NOSE SURGERY     Comment:  turbinate reduction 1975: SEPTOPLASTY 2000: SHOULDER SURGERY     Comment:  rotator cuff No date: spinal stimulator  BMI    Body Mass Index: 25.84 kg/m      Reproductive/Obstetrics negative OB ROS                              Anesthesia Physical Anesthesia Plan  ASA: 3  Anesthesia Plan: General   Post-op Pain Management:    Induction: Intravenous  PONV Risk Score and Plan: Ondansetron , Dexamethasone , Midazolam  and Treatment may vary due to age or medical condition  Airway Management Planned: Oral ETT  Additional Equipment:   Intra-op Plan:   Post-operative Plan: Extubation in OR  Informed Consent: I have reviewed the patients History and Physical, chart, labs and discussed the procedure including the risks, benefits and alternatives for the proposed anesthesia with the patient or authorized representative who has indicated his/her understanding and acceptance.     Dental Advisory Given  Plan Discussed with: CRNA  Anesthesia Plan Comments:         Anesthesia Quick Evaluation

## 2024-03-14 NOTE — Anesthesia Postprocedure Evaluation (Signed)
 Anesthesia Post Note  Patient: Ricardo Nash  Procedure(s) Performed: ENUCLEATION, PROSTATE, USING LASER, WITH MORCELLATION (Prostate)  Patient location during evaluation: PACU Anesthesia Type: General Level of consciousness: awake Pain management: satisfactory to patient Vital Signs Assessment: post-procedure vital signs reviewed and stable Respiratory status: spontaneous breathing Cardiovascular status: stable Anesthetic complications: no   No notable events documented.   Last Vitals:  Vitals:   03/14/24 1222 03/14/24 1259  BP: 125/62 (!) 135/51  Pulse: 73   Resp: 18 18  Temp: 36.6 C   SpO2: 96% 96%    Last Pain:  Vitals:   03/14/24 1251  TempSrc:   PainSc: 4                  VAN STAVEREN,Taya Ashbaugh

## 2024-03-15 ENCOUNTER — Telehealth: Payer: Self-pay

## 2024-03-15 ENCOUNTER — Encounter: Payer: Self-pay | Admitting: Urology

## 2024-03-15 NOTE — Telephone Encounter (Signed)
 Signed and placed in box.

## 2024-03-15 NOTE — Telephone Encounter (Signed)
 Received plan of care order from [facility] placed inf folder for review

## 2024-03-15 NOTE — Telephone Encounter (Signed)
Form faxed and placed in scan folder.

## 2024-03-16 ENCOUNTER — Encounter: Payer: Self-pay | Admitting: Urology

## 2024-03-16 ENCOUNTER — Ambulatory Visit: Payer: Self-pay | Admitting: Urology

## 2024-03-16 ENCOUNTER — Ambulatory Visit: Admitting: Urology

## 2024-03-16 VITALS — BP 84/50 | HR 80 | Ht 69.0 in | Wt 175.0 lb

## 2024-03-16 DIAGNOSIS — N138 Other obstructive and reflux uropathy: Secondary | ICD-10-CM

## 2024-03-16 DIAGNOSIS — N401 Enlarged prostate with lower urinary tract symptoms: Secondary | ICD-10-CM

## 2024-03-16 DIAGNOSIS — C61 Malignant neoplasm of prostate: Secondary | ICD-10-CM

## 2024-03-16 LAB — SURGICAL PATHOLOGY

## 2024-03-16 NOTE — Patient Instructions (Signed)

## 2024-03-16 NOTE — Telephone Encounter (Signed)
 Lab appt scheduled. PSA ordered.

## 2024-03-21 DIAGNOSIS — M2550 Pain in unspecified joint: Secondary | ICD-10-CM | POA: Diagnosis not present

## 2024-03-21 DIAGNOSIS — M150 Primary generalized (osteo)arthritis: Secondary | ICD-10-CM | POA: Diagnosis not present

## 2024-03-22 DIAGNOSIS — M5451 Vertebrogenic low back pain: Secondary | ICD-10-CM | POA: Diagnosis not present

## 2024-03-22 DIAGNOSIS — R531 Weakness: Secondary | ICD-10-CM | POA: Diagnosis not present

## 2024-03-22 DIAGNOSIS — R262 Difficulty in walking, not elsewhere classified: Secondary | ICD-10-CM | POA: Diagnosis not present

## 2024-03-24 ENCOUNTER — Ambulatory Visit

## 2024-03-24 DIAGNOSIS — I480 Paroxysmal atrial fibrillation: Secondary | ICD-10-CM

## 2024-03-24 DIAGNOSIS — M5451 Vertebrogenic low back pain: Secondary | ICD-10-CM | POA: Diagnosis not present

## 2024-03-24 DIAGNOSIS — R531 Weakness: Secondary | ICD-10-CM | POA: Diagnosis not present

## 2024-03-24 DIAGNOSIS — R262 Difficulty in walking, not elsewhere classified: Secondary | ICD-10-CM | POA: Diagnosis not present

## 2024-03-29 DIAGNOSIS — R531 Weakness: Secondary | ICD-10-CM | POA: Diagnosis not present

## 2024-03-29 DIAGNOSIS — M5451 Vertebrogenic low back pain: Secondary | ICD-10-CM | POA: Diagnosis not present

## 2024-03-29 DIAGNOSIS — R262 Difficulty in walking, not elsewhere classified: Secondary | ICD-10-CM | POA: Diagnosis not present

## 2024-03-30 ENCOUNTER — Ambulatory Visit: Payer: Self-pay | Admitting: Cardiology

## 2024-03-30 DIAGNOSIS — E119 Type 2 diabetes mellitus without complications: Secondary | ICD-10-CM | POA: Diagnosis not present

## 2024-03-30 DIAGNOSIS — H04123 Dry eye syndrome of bilateral lacrimal glands: Secondary | ICD-10-CM | POA: Diagnosis not present

## 2024-03-30 DIAGNOSIS — H353131 Nonexudative age-related macular degeneration, bilateral, early dry stage: Secondary | ICD-10-CM | POA: Diagnosis not present

## 2024-03-30 DIAGNOSIS — H1045 Other chronic allergic conjunctivitis: Secondary | ICD-10-CM | POA: Diagnosis not present

## 2024-03-30 DIAGNOSIS — G510 Bell's palsy: Secondary | ICD-10-CM | POA: Diagnosis not present

## 2024-03-30 LAB — OPHTHALMOLOGY REPORT-SCANNED

## 2024-03-30 LAB — CUP PACEART REMOTE DEVICE CHECK
Date Time Interrogation Session: 20251118112500
Implantable Pulse Generator Implant Date: 20250225
Pulse Gen Serial Number: 135133

## 2024-03-30 NOTE — Progress Notes (Signed)
 Remote Loop Recorder Transmission

## 2024-03-31 DIAGNOSIS — R262 Difficulty in walking, not elsewhere classified: Secondary | ICD-10-CM | POA: Diagnosis not present

## 2024-03-31 DIAGNOSIS — R531 Weakness: Secondary | ICD-10-CM | POA: Diagnosis not present

## 2024-03-31 DIAGNOSIS — M5451 Vertebrogenic low back pain: Secondary | ICD-10-CM | POA: Diagnosis not present

## 2024-04-02 ENCOUNTER — Other Ambulatory Visit: Payer: Self-pay | Admitting: Internal Medicine

## 2024-04-05 DIAGNOSIS — R262 Difficulty in walking, not elsewhere classified: Secondary | ICD-10-CM | POA: Diagnosis not present

## 2024-04-05 DIAGNOSIS — R531 Weakness: Secondary | ICD-10-CM | POA: Diagnosis not present

## 2024-04-05 DIAGNOSIS — M5451 Vertebrogenic low back pain: Secondary | ICD-10-CM | POA: Diagnosis not present

## 2024-04-11 ENCOUNTER — Telehealth: Payer: Self-pay

## 2024-04-11 NOTE — Telephone Encounter (Signed)
 Called patient to follow up on his after hours call to us  on 11/28. Patient states after the call the next day he passed a small piece of tisse and he started urinating normally and has not had any other symptoms since then. Pt will keep their f/u with surgeon in Feb. And it was discussed with patient what signs of a UTI to look out for. Pt voiced understanding.

## 2024-04-12 ENCOUNTER — Ambulatory Visit (HOSPITAL_COMMUNITY)

## 2024-04-12 DIAGNOSIS — R531 Weakness: Secondary | ICD-10-CM | POA: Diagnosis not present

## 2024-04-12 DIAGNOSIS — R262 Difficulty in walking, not elsewhere classified: Secondary | ICD-10-CM | POA: Diagnosis not present

## 2024-04-12 DIAGNOSIS — M5451 Vertebrogenic low back pain: Secondary | ICD-10-CM | POA: Diagnosis not present

## 2024-04-13 ENCOUNTER — Other Ambulatory Visit: Payer: Self-pay | Admitting: Internal Medicine

## 2024-04-13 NOTE — Telephone Encounter (Signed)
 Rx ok'd for trazodone  and alprazolam . PDMP reviewed.

## 2024-04-14 ENCOUNTER — Ambulatory Visit: Admitting: Urology

## 2024-04-14 ENCOUNTER — Telehealth: Payer: Self-pay | Admitting: *Deleted

## 2024-04-14 VITALS — BP 145/73 | HR 77 | Ht 69.0 in | Wt 171.0 lb

## 2024-04-14 DIAGNOSIS — N50819 Testicular pain, unspecified: Secondary | ICD-10-CM | POA: Diagnosis not present

## 2024-04-14 DIAGNOSIS — C61 Malignant neoplasm of prostate: Secondary | ICD-10-CM

## 2024-04-14 DIAGNOSIS — N138 Other obstructive and reflux uropathy: Secondary | ICD-10-CM

## 2024-04-14 DIAGNOSIS — N50812 Left testicular pain: Secondary | ICD-10-CM | POA: Diagnosis not present

## 2024-04-14 DIAGNOSIS — N401 Enlarged prostate with lower urinary tract symptoms: Secondary | ICD-10-CM | POA: Diagnosis not present

## 2024-04-14 LAB — BLADDER SCAN AMB NON-IMAGING: Scan Result: 142

## 2024-04-14 MED ORDER — SULFAMETHOXAZOLE-TRIMETHOPRIM 800-160 MG PO TABS: 1.0000 | ORAL_TABLET | Freq: Two times a day (BID) | ORAL | 0 refills | Status: DC

## 2024-04-14 NOTE — Telephone Encounter (Signed)
 Patient called in today and states he is having right testicle pain . He states his right testicle is half way up in his groin area which he does have a ascending right testicle per note in 2024. The pain has been going on for two days now. The pain scale is 6 out of 10. He is urinating just fine but has been constipated . The testicle is not red or swollen that he know of.  . He has been using tylenol   that has helped some. Please advise .

## 2024-04-14 NOTE — Telephone Encounter (Signed)
 Patient is coming in office today at 400pm

## 2024-04-15 LAB — URINALYSIS, COMPLETE
Bilirubin, UA: NEGATIVE
Ketones, UA: NEGATIVE
Nitrite, UA: NEGATIVE
Specific Gravity, UA: 1.01 (ref 1.005–1.030)
Urobilinogen, Ur: 1 mg/dL (ref 0.2–1.0)
pH, UA: 6 (ref 5.0–7.5)

## 2024-04-15 LAB — MICROSCOPIC EXAMINATION: WBC, UA: >30 /HPF — AB (ref 0–5)

## 2024-04-17 ENCOUNTER — Emergency Department
Admission: EM | Admit: 2024-04-17 | Discharge: 2024-04-17 | Disposition: A | Discharge status: Home / Self Care | Attending: Emergency Medicine | Admitting: Emergency Medicine

## 2024-04-17 ENCOUNTER — Emergency Department

## 2024-04-17 ENCOUNTER — Encounter: Payer: Self-pay | Admitting: Urology

## 2024-04-17 ENCOUNTER — Other Ambulatory Visit: Payer: Self-pay

## 2024-04-17 DIAGNOSIS — E119 Type 2 diabetes mellitus without complications: Secondary | ICD-10-CM | POA: Insufficient documentation

## 2024-04-17 DIAGNOSIS — N451 Epididymitis: Secondary | ICD-10-CM | POA: Insufficient documentation

## 2024-04-17 DIAGNOSIS — N453 Epididymo-orchitis: Secondary | ICD-10-CM | POA: Insufficient documentation

## 2024-04-17 DIAGNOSIS — N452 Orchitis: Secondary | ICD-10-CM | POA: Diagnosis not present

## 2024-04-17 DIAGNOSIS — I10 Essential (primary) hypertension: Secondary | ICD-10-CM | POA: Insufficient documentation

## 2024-04-17 LAB — CBC
HCT: 37.4 % — ABNORMAL LOW (ref 39.0–52.0)
Hemoglobin: 11.4 g/dL — ABNORMAL LOW (ref 13.0–17.0)
MCH: 23.9 pg — ABNORMAL LOW (ref 26.0–34.0)
MCHC: 30.5 g/dL (ref 30.0–36.0)
MCV: 78.6 fL — ABNORMAL LOW (ref 80.0–100.0)
Platelets: 318 K/uL (ref 150–400)
RBC: 4.76 MIL/uL (ref 4.22–5.81)
RDW: 16.3 % — ABNORMAL HIGH (ref 11.5–15.5)
WBC: 10 K/uL (ref 4.0–10.5)
nRBC: 0 % (ref 0.0–0.2)

## 2024-04-17 LAB — URINALYSIS, ROUTINE W REFLEX MICROSCOPIC
Bacteria, UA: NONE SEEN
Bilirubin Urine: NEGATIVE
Glucose, UA: >=500 mg/dL — AB
Ketones, ur: NEGATIVE mg/dL
Nitrite: NEGATIVE
Protein, ur: 30 mg/dL — AB
Specific Gravity, Urine: 1.023 (ref 1.005–1.030)
WBC, UA: >50 WBC/hpf (ref 0–5)
pH: 5 (ref 5.0–8.0)

## 2024-04-17 LAB — BASIC METABOLIC PANEL WITH GFR
Anion gap: 12 (ref 5–15)
BUN: 25 mg/dL — ABNORMAL HIGH (ref 8–23)
CO2: 24 mmol/L (ref 22–32)
Calcium: 9.6 mg/dL (ref 8.9–10.3)
Chloride: 104 mmol/L (ref 98–111)
Creatinine, Ser: 0.95 mg/dL (ref 0.61–1.24)
GFR, Estimated: >60 mL/min (ref >60)
Glucose, Bld: 104 mg/dL — ABNORMAL HIGH (ref 70–99)
Potassium: 4.5 mmol/L (ref 3.5–5.1)
Sodium: 140 mmol/L (ref 135–145)

## 2024-04-17 MED ORDER — OXYCODONE HCL 5 MG PO TABS
5.0000 mg | ORAL_TABLET | Freq: Once | ORAL | Status: AC
  Administered 2024-04-17: 5 mg via ORAL
  Filled 2024-04-17: qty 1

## 2024-04-17 MED ORDER — MORPHINE SULFATE (PF) 4 MG/ML IV SOLN
4.0000 mg | Freq: Once | INTRAVENOUS | Status: AC
  Administered 2024-04-17: 4 mg via INTRAVENOUS
  Filled 2024-04-17: qty 1

## 2024-04-17 MED ORDER — LEVOFLOXACIN 750 MG PO TABS: 750.0000 mg | ORAL_TABLET | Freq: Every day | ORAL | 0 refills | Status: DC

## 2024-04-17 MED ORDER — OXYCODONE HCL 5 MG PO TABS: 5.0000 mg | ORAL_TABLET | Freq: Three times a day (TID) | ORAL | 0 refills | Status: DC | PRN

## 2024-04-17 MED ORDER — ONDANSETRON HCL 4 MG/2ML IJ SOLN
4.0000 mg | Freq: Once | INTRAMUSCULAR | Status: AC
  Administered 2024-04-17: 4 mg via INTRAVENOUS
  Filled 2024-04-17: qty 2

## 2024-04-17 MED ORDER — LEVOFLOXACIN 750 MG PO TABS
750.0000 mg | ORAL_TABLET | Freq: Once | ORAL | Status: AC
  Administered 2024-04-17: 750 mg via ORAL
  Filled 2024-04-17: qty 1

## 2024-04-17 NOTE — ED Provider Notes (Signed)
 [facility] Provider Note    Event Date/Time   First MD Initiated Contact with Patient 04/17/24 1746     (approximate)   History   Dysuria and Testicle Pain   HPI  Ricardo Nash is a 78 y.o. male with history of diabetes, hypertension, GERD, A-fib not on anticoagulants, prostate surgery a month ago and as listed in EMR presents to the emergency department for treatment and evaluation of left side testicular pain that radiates across left pelvis and left lower back. Also experiencing dysuria. Urology recommended coming to the emergency department for ultrasound to rule out torsion.     Physical Exam    Vitals:   04/17/24 1647 04/17/24 1945  BP: (!) 131/54 135/71  Pulse: 75 77  Resp: 16 18  Temp: 98.8 F (37.1 C) 98.3 F (36.8 C)  SpO2: 98% 98%    General: Awake, no distress.  CV:  Good peripheral perfusion.  Resp:  Normal effort.  Abd:  No distention.  Other:  Left testicle slightly swollen   ED Results / Procedures / Treatments   Labs (all labs ordered are listed, but only abnormal results are displayed)  Labs Reviewed  URINALYSIS, ROUTINE W REFLEX MICROSCOPIC - Abnormal; Notable for the following components:      Result Value   Color, Urine YELLOW (*)    APPearance CLOUDY (*)    Glucose, UA >=500 (*)    Hgb urine dipstick SMALL (*)    Protein, ur 30 (*)    Leukocytes,Ua LARGE (*)    All other components within normal limits  BASIC METABOLIC PANEL WITH GFR - Abnormal; Notable for the following components:   Glucose, Bld 104 (*)    BUN 25 (*)    All other components within normal limits  CBC - Abnormal; Notable for the following components:   Hemoglobin 11.4 (*)    HCT 37.4 (*)    MCV 78.6 (*)    MCH 23.9 (*)    RDW 16.3 (*)    All other components within normal limits  URINE CULTURE     EKG  Not indicated   RADIOLOGY  Image and radiology report reviewed and interpreted by me. Radiology report consistent with  the same.  Ultrasound shows left-sided epididymoorchitis without evidence of testicular torsion and bilateral hydroceles.  Left hydrocele is minimally complex with thin septations, likely reactive given findings of epididymal orchitis  PROCEDURES:  Critical Care performed: No  Procedures   MEDICATIONS ORDERED IN ED:  Medications  morphine  (PF) 4 MG/ML injection 4 mg (4 mg Intravenous Given 04/17/24 1847)  ondansetron  (ZOFRAN ) injection 4 mg (4 mg Intravenous Given 04/17/24 1847)  levofloxacin  (LEVAQUIN ) tablet 750 mg (750 mg Oral Given 04/17/24 1951)  oxyCODONE  (Oxy IR/ROXICODONE ) immediate release tablet 5 mg (5 mg Oral Given 04/17/24 1950)     IMPRESSION / MDM / ASSESSMENT AND PLAN / ED COURSE   I have reviewed the triage note and vital signs. Vital signs are stable.   Differential diagnosis includes, but is not limited to, testicular torsion, epididymitis, orchitis, prostatitis  Patient's presentation is most consistent with acute presentation with potential threat to life or bodily function.  78 year old male presenting to the emergency department for treatment and evaluation of dysuria and testicle pain and swelling.  See HPI for further details.  No leukocytosis noted on CBC.  BMP is normal with the exception of a BUN of 25 but a normal creatinine of 0.95 and a GFR of greater  than 60.  Urine specimen is cloudy with greater than 500 glucose, small hemoglobin, 30 protein, and large number of leukocytes with greater than 50 white blood cells.  Urine culture added.  Ultrasound is consistent with epididymal orchitis without evidence of torsion.  Case discussed with ED attending, Dr. Suzanne.  Plan will be to discharge him home on Levaquin  and have him stop the Bactrim .  He is to schedule a follow-up appointment with urology as well.  ER return precautions discussed.  Patient and wife comfortable with this plan.     FINAL CLINICAL IMPRESSION(S) / ED DIAGNOSES   Final diagnoses:   Orchitis, epididymitis, and epididymo-orchitis     Rx / DC Orders   ED Discharge Orders          Ordered    oxyCODONE  (ROXICODONE ) 5 MG immediate release tablet  Every 8 hours PRN        04/17/24 1945    levofloxacin  (LEVAQUIN ) 750 MG tablet  Daily        04/17/24 1945             Note:  This document was prepared using Dragon voice recognition software and may include unintentional dictation errors.   Herlinda Kirk NOVAK, FNP 04/17/24 2351    Suzanne Kirsch, MD 04/18/24 1230

## 2024-04-17 NOTE — Progress Notes (Signed)
 04/14/2024 5:43 PM   Ricardo Nash [birth year] [id number]  Referring provider: Glendia Shad, MD [address] [place],  [place] [address]  Urological history: 1. BPH with incontinence - HoLEP (03/2024) - pathology gleason grade 3 + 4 = 7   2. Erectile dysfunction - failed PDE5i's  - failed ICI   3. Hypogonadism - testosterone  cypionate 200 mg/mL, 0.5 cc every 14 days   Chief Complaint  Patient presents with   Testicle Pain    HPI: Ricardo Nash is a 78 y.o. man who presents today for testicular pain.    Previous records reviewed.  He states he has been having two days of testicular pain.  On Tuesday, he had a PT session and later that day the left groin started to become painful and his left testicle was extremely tender to touch.  He is wondering if it was precipitated by not being able to void at all on Thanksgiving day, but he passed a piece of tissue Friday am and was able to void freely after the passage of the tissue.   Patient denies any modifying or aggravating factors.  Patient denies any recent UTI's, gross hematuria, dysuria or suprapubic/flank pain.  Patient denies any fevers, chills, or nausea.  He vomited once last night.    UA yellow cloudy, specific gravity 1.010, pH 6.0, 3+ glucose, trace protein, 3 + heme, 2+ leukocytes, > 30 WBC's, 11-30 RBC's, 0-10 epithelial cells and moderate bacteria.    PVR 142 mL   PMH: Past Medical History:  Diagnosis Date   Anxiety    a.) on BZO (alprazolam ) PRN   Aortic atherosclerosis    BPH with urinary obstruction    C. difficile colitis    CAD (coronary artery disease)    Cerebral microvascular disease    Coronary artery disease    DDD (degenerative disc disease), lumbar    a.) s/p lumbar decompression and fusion (L4-5); BILATERAL partial facetectomy and foraminotomies.   Depression    Diverticulitis    Environmental allergies    GERD (gastroesophageal reflux disease)    Goiter     intrathoracic, s/p benign biopsy (Dr Eletha)   HFrEF (heart failure with reduced ejection fraction) (HCC)    Hypercholesterolemia    Hypertension    Implantable loop recorder present 06/2023   Insomnia    a.) uses trazodone  PRN   LBBB (left bundle branch block)    Long-term use of aspirin  therapy    Male hypogonadism    a.) on exogenous TRT (depotestosterone cypionate)   NICM (nonischemic cardiomyopathy) (HCC)    NSVT (nonsustained ventricular tachycardia) (HCC)    OSA on CPAP    Osteoarthritis    cervical spine, lumbar spine   PAF (paroxysmal atrial fibrillation) (HCC) 2002   a.) Dx'd 2002 - likely lone A.fib per cardiology; b.) CHA2DS2VASc = 8 (age x 2, HFrEF,  HTN, CVA/TIA x 2, vascular disease, T2DM) as of 03/09/2024; c.) rate/rhythm maintained on oral carvedilol ; no chronic OAC   Pneumonia    PONV (postoperative nausea and vomiting)    PSVT (paroxysmal supraventricular tachycardia)    Status post bilateral cataract extraction    Stroke (HCC)    T2DM (type 2 diabetes mellitus) (HCC)    TIA (transient ischemic attack)     Surgical History: Past Surgical History:  Procedure Laterality Date   BACK SURGERY  12/2006   s/p fusion of L4-L5   BACK SURGERY  2024   capsule endoscopy  CATARACT EXTRACTION  2011   Dr. Cleatus   CERVICAL DISC SURGERY  2002   CHOLECYSTECTOMY  2024   COLONOSCOPY WITH PROPOFOL  N/A 07/20/2017   Procedure: COLONOSCOPY WITH PROPOFOL ;  Surgeon: Viktoria Lamar DASEN, MD;  Location: [facility] ENDOSCOPY;  Service: Endoscopy;  Laterality: N/A;   COLONOSCOPY WITH PROPOFOL  N/A 07/27/2023   Procedure: COLONOSCOPY WITH PROPOFOL ;  Surgeon: Maryruth Ole DASEN, MD;  Location: [facility] ENDOSCOPY;  Service: Endoscopy;  Laterality: N/A;   EYE SURGERY     cataract bilateral 10/1999   eyelid reduction     Dr. Gretta   gallbladder removed  12/2022   HOLEP-LASER ENUCLEATION OF THE PROSTATE WITH MORCELLATION N/A 03/14/2024   Procedure: ENUCLEATION, PROSTATE, USING LASER, WITH  MORCELLATION;  Surgeon: Francisca Redell BROCKS, MD;  Location: [facility] ORS;  Service: Urology;  Laterality: N/A;   INGUINAL HERNIA REPAIR  1991   Dr, Claudene   LEFT HEART CATH AND CORONARY ANGIOGRAPHY Left 06/02/2023   Procedure: LEFT HEART CATH AND CORONARY ANGIOGRAPHY;  Surgeon: Mady Bruckner, MD;  Location: [facility] INVASIVE CV LAB;  Service: Cardiovascular;  Laterality: Left;   LUMBAR LAMINECTOMY/DECOMPRESSION MICRODISCECTOMY N/A 10/15/2020   Procedure: Laminectomy - Lumbar Two-Lumbar Three - Lumbar Three-Lumbar Four with sublaminar decompression;  Surgeon: Joshua Alm RAMAN, MD;  Location: [facility] OR;  Service: Neurosurgery;  Laterality: N/A;  Laminectomy - Lumbar Two-Lumbar Three - Lumbar Three-Lumbar Four with sublaminar decompression   NOSE SURGERY     turbinate reduction   SEPTOPLASTY  1975   SHOULDER SURGERY  2000   rotator cuff   spinal stimulator      Home Medications:  Allergies as of 04/14/2024       Reactions   Penicillins Hives   Childhood allergy (age 89)   Penicillin G    Other Reaction(s): Weal (disorder)        Medication List        Accurate as of April 14, 2024 11:59 PM. If you have any questions, ask your nurse or doctor.          Accu-Chek Guide Test test strip Generic drug: glucose blood USE AS DIRECTED TO TEST BLOOD GLUCOSE LEVELS TWICE DAILY   ALPRAZolam  0.25 MG tablet Commonly known as: XANAX  TAKE 1 TABLET BY MOUTH ONCE DAILY AS NEEDED   aspirin  EC 81 MG tablet Take 81 mg by mouth every evening. Swallow whole.   atorvastatin  40 MG tablet Commonly known as: LIPITOR TAKE 1 TABLET BY MOUTH ONCE EVERY EVENING   azelastine  0.05 % ophthalmic solution Commonly known as: OPTIVAR  Place 1 drop into both eyes daily.   Azelastine -Fluticasone  137-50 MCG/ACT Susp PLACE 1 SPRAY INTO EACH NOSTRIL ONCE EVERY MORNING AND AT BEDTIME   blood glucose meter kit and supplies Dispense based on patient and insurance preference. Use up to four times daily as directed. (FOR  ICD-10 E10.9, E11.9).   carvedilol  25 MG tablet Commonly known as: COREG  Take 1 tablet (25 mg total) by mouth 2 (two) times daily with a meal.   empagliflozin  10 MG Tabs tablet Commonly known as: JARDIANCE  Take 1 tablet (10 mg total) by mouth daily before breakfast.   fluconazole  100 MG tablet Commonly known as: DIFLUCAN  Take 2 Tablets (200mg ) on Day 1, then take 1 tablet (100mg ) x 7 days   fluorometholone 0.1 % ophthalmic suspension Commonly known as: FML Place 1 drop into both eyes daily.   gabapentin  100 MG capsule Commonly known as: NEURONTIN  TAKE 1 CAPSULE BY MOUTH 3 TIMES DAILY   levocetirizine 5  MG tablet Commonly known as: XYZAL  TAKE 1 TABLET BY MOUTH ONCE EVERY EVENING   metFORMIN  1000 MG tablet Commonly known as: GLUCOPHAGE  TAKE 1 TABLET BY MOUTH TWICE DAILY WITH MEALS   methocarbamol  500 MG tablet Commonly known as: ROBAXIN  Take 1 tablet (500 mg total) by mouth every 8 (eight) hours as needed for muscle spasms.   montelukast  10 MG tablet Commonly known as: SINGULAIR  TAKE 1 TABLET BY MOUTH AT BEDTIME   multivitamin with minerals Tabs tablet Take 1 tablet by mouth in the morning.   pantoprazole  40 MG tablet Commonly known as: PROTONIX  TAKE 1 TABLET BY MOUTH ONCE DAILY   pioglitazone  15 MG tablet Commonly known as: ACTOS  Take 1 tablet (15 mg total) by mouth daily.   sacubitril -valsartan  49-51 MG Commonly known as: Entresto  Take 1 tablet by mouth 2 (two) times daily.   sertraline  100 MG tablet Commonly known as: ZOLOFT  Take 1 tablet (100 mg total) by mouth daily.   spironolactone  25 MG tablet Commonly known as: ALDACTONE  Take 25 mg by mouth daily.   sulfamethoxazole -trimethoprim  800-160 MG tablet Commonly known as: BACTRIM  DS Take 1 tablet by mouth every 12 (twelve) hours. Started by: Janie Capp   testosterone  cypionate 200 MG/ML injection Commonly known as: DEPOTESTOSTERONE CYPIONATE INJECT 0.5MLS INTRAMUSCULARLY EVERY 14 DAYS    traZODone  50 MG tablet Commonly known as: DESYREL  TAKE 1 TABLET BY MOUTH AT BEDTIME AS NEEDED FOR SLEEP   Trulicity  3 MG/0.5ML Soaj Generic drug: Dulaglutide  INJECT 3MG  SUBCUTANEOUSLY ONCE A WEEK        Allergies:  Allergies  Allergen Reactions   Penicillins Hives    Childhood allergy (age 3)   Penicillin G     Other Reaction(s): Weal (disorder)    Family History: Family History  Problem Relation Age of Onset   Congestive Heart Failure Father    Heart disease Father        myocardial infarction   Rheumatic fever Father        valvular disease   Heart disease Mother        s/p CABG (age 72)   Kidney disease Sister    Colon cancer Neg Hx    Prostate cancer Neg Hx     Social History:  reports that he has quit smoking. He has never been exposed to tobacco smoke. He has never used smokeless tobacco. He reports current alcohol use. He reports that he does not use drugs.  ROS: Pertinent ROS in HPI  Physical Exam: BP (!) 145/73   Pulse 77   Ht 5' 9 (1.753 m)   Wt 171 lb (77.6 kg)   BMI 25.25 kg/m   Constitutional:  Well nourished. Alert and oriented, No acute distress. HEENT: [place] AT, moist mucus membranes.  Trachea midline Cardiovascular: No clubbing, cyanosis, or edema. Respiratory: Normal respiratory effort, no increased work of breathing. GU: No CVA tenderness.  No bladder fullness or masses.  Patient with uncircumcised phallus. Foreskin easily retracted  Urethral meatus is patent.  No penile discharge. No penile lesions or rashes. Scrotum without lesions, cysts, rashes and/or edema.  Right testicle is located scrotally.  No masses are appreciated in the right testicle. Right epididymis is normal.  Left testicle and epididymis are exquisitely tender to palpation.  Neurologic: Grossly intact, no focal deficits, moving all 4 extremities. Psychiatric: Normal mood and affect.  Laboratory Data: See Epic and HPI   I have reviewed the labs.   Pertinent Imaging:   04/14/24 16:01  Scan Result 142  Assessment & Plan:    1. Epididymitis/Orchitis - Discussed with the patient that his testicular pain may be secondary to either an infectious process or testicular torsion. Explained that the only definitive way to exclude torsion is prompt evaluation in the emergency department with Doppler ultrasound. He was advised that delaying evaluation carries the risk of testicular ischemia and potential loss of the testicle if torsion is present. The patient verbalized understanding of these risks but declines transfer to the emergency department at this time, acknowledging that he may lose the testicle if the etiology is torsion rather than infection. - I have sent in a prescription for Bactrim   - UA suspicious for infection - urine culture pending  - ED triggers discussed (worsening pain, fevers, swelling, vomiting)   2. BPH with LU TS - s/p HoLEP - PVR demonstrates adequate bladder emptying   3. Prostate cancer - has follow up in February with Dr. Francisca   Return for Follow up pending labs.  These notes generated with voice recognition software. I apologize for typographical errors.  CLOTILDA HELON RIGGERS  [facility] [address]  [address] [place], [place] [address] [phone]

## 2024-04-17 NOTE — ED Triage Notes (Signed)
 Pt to ED with wife for dysuria since Wednesday, seen by urologist and was recommended to have u/s to rule out testicular torsion because was also having testicular pain. Has been on sulfa  antibiotic but having increasing pain last 2 days.  Had laser surgery to prostate 1 month ago.  Still having testicular pain that radiates across L pelvis and to L lower back and some L testicle swelling. Denies hematuria.

## 2024-04-17 NOTE — ED Provider Triage Note (Signed)
 Emergency Medicine Provider Triage Evaluation Note  Jenson Beedle , a 78 y.o. male  was evaluated in triage.  Pt complains of left-sided testicular pain.  No fever..  Physical Exam  BP (!) 131/54 (BP Location: Right Arm)   Pulse 75   Temp 98.8 F (37.1 C) (Oral)   Resp 16   Ht 5' 9 (1.753 m)   Wt 78 kg   SpO2 98%   BMI 25.40 kg/m  Gen:   Awake, no distress   Resp:  Normal effort  MSK:   Moves extremities without difficulty  Other:    Medical Decision Making  Medically screening exam initiated at 5:00 PM.  Appropriate orders placed.  Jonnatan [facility] was informed that the remainder of the evaluation will be completed by another provider, this initial triage assessment does not replace that evaluation, and the importance of remaining in the ED until their evaluation is complete.    Herlinda Kirk NOVAK, FNP 04/17/24 [phone]

## 2024-04-17 NOTE — Discharge Instructions (Addendum)
 Please stop the Bactrim .  Pick up the new antibiotic tomorrow morning.  Take the pain medication as prescribed if Tylenol  does not help.  Be aware that it may make you drowsy or dizzy and it puts you at higher risk for a fall.  Do not drive or operate machinery for at least 8 hours after your last dose.  Return to the emergency department for symptoms that change or worsen if you are unable to see your primary care provider or the urologist.

## 2024-04-17 NOTE — ED Provider Notes (Signed)
 Shared visit  Recent prostate procedure presents to the emergency department with testicular pain and burning with urination.  Ultrasound without signs of torsion.  Urine appears infected.  Currently on Bactrim .  Sending a new culture.  Will switch to fluoroquinolone.  Pain well-controlled after IV morphine .  Discussed close follow-up with urology.  Discussed return for any fever or worsening symptoms.   Suzanne Kirsch, MD 04/17/24 [phone]

## 2024-04-18 ENCOUNTER — Telehealth: Payer: Self-pay

## 2024-04-18 NOTE — Telephone Encounter (Signed)
 Patient called in to notify Clotilda Cornwall, PA that he was seen yesterday in ER for increased pain in his testicles. Pt was given a new antibiotic and pain meds. He states he is starting to feel a bit better than yesterday and wanted us  to know of the changes. Pt was instructed to continue the course given in the ER and to try and stay ahead on his pain meds to keep that under control. Pt voiced understanding. Pt was instructed to give us  a call back if he needs anything in the future.

## 2024-04-19 LAB — URINE CULTURE

## 2024-04-19 LAB — CULTURE, URINE COMPREHENSIVE

## 2024-04-20 ENCOUNTER — Ambulatory Visit: Payer: Self-pay | Admitting: Urology

## 2024-04-20 NOTE — Telephone Encounter (Signed)
 Patient called back. States that pain is not improving and feels like swelling is worsening. Placed on Shannons schedule tomorrow for follow up.

## 2024-04-20 NOTE — Telephone Encounter (Signed)
 Left VM message for patient to call back in regards to urine culture results.  Andrea Kirks LPN

## 2024-04-20 NOTE — Telephone Encounter (Signed)
-----   Message from [facility] sent at 04/20/2024  9:46 AM EST ----- Please let Mr. Roberts know that the urine culture returned mixed urogenital flora, but this is not uncommon when the infection is actually inside the testicle.  I would complete the Levaquin  that  was prescribed to him by the emergency department.  We need to see him back in two weeks for recheck.  If he is not seeing improvement, we need to see him this week.  ----- Message ----- From: Interface, [facility] Lab Results In Sent: 04/15/2024   5:38 AM EST To: Clotilda DELENA Cornwall, PA-C

## 2024-04-21 ENCOUNTER — Encounter: Payer: Self-pay | Admitting: Urology

## 2024-04-21 ENCOUNTER — Ambulatory Visit: Admitting: Urology

## 2024-04-21 VITALS — BP 114/69 | HR 86 | Ht 69.0 in | Wt 170.0 lb

## 2024-04-21 DIAGNOSIS — N401 Enlarged prostate with lower urinary tract symptoms: Secondary | ICD-10-CM | POA: Diagnosis not present

## 2024-04-21 DIAGNOSIS — N453 Epididymo-orchitis: Secondary | ICD-10-CM | POA: Diagnosis not present

## 2024-04-21 DIAGNOSIS — C61 Malignant neoplasm of prostate: Secondary | ICD-10-CM | POA: Diagnosis not present

## 2024-04-21 MED ORDER — LEVOFLOXACIN 750 MG PO TABS: 750.0000 mg | ORAL_TABLET | Freq: Every day | ORAL | 0 refills | Status: AC

## 2024-04-21 MED ADMIN — Gentamicin Sulfate Inj 40 MG/ML: 80 mg | INTRAMUSCULAR | NDC 63323001001

## 2024-04-21 NOTE — Progress Notes (Signed)
 04/21/2024 12:19 PM   Ricardo Nash 07/31/1945 [id number]  Referring provider: Glendia Shad, MD [address] [place],  [place] [address]  Urological history: 1. BPH with incontinence - HoLEP (03/2024) - pathology gleason grade 3 + 4 = 7   2. Erectile dysfunction - failed PDE5i's  - failed ICI   3. Hypogonadism - testosterone  cypionate 200 mg/mL, 0.5 cc every 14 days   Chief Complaint  Patient presents with   Testicle Pain    HPI: Ricardo Nash is a 78 y.o. man who presents today for testicular pain.    Previous records reviewed.  I saw him on 04/14/2024, after he had been having two days of testicular pain.  On Tuesday, he had a PT session and later that day the left groin started to become painful and his left testicle was extremely tender to touch.  He is wondering if it was precipitated by not being able to void at all on Thanksgiving day, but he passed a piece of tissue Friday am and was able to void freely after the passage of the tissue.   Patient denies any modifying or aggravating factors.  Patient denies any recent UTI's, gross hematuria, dysuria or suprapubic/flank pain.  Patient denies any fevers, chills, or nausea.  He vomited once last night.  UA yellow cloudy, specific gravity 1.010, pH 6.0, 3+ glucose, trace protein, 3 + heme, 2+ leukocytes, > 30 WBC's, 11-30 RBC's, 0-10 epithelial cells and moderate bacteria.  Urine culture grew out mixed urogenital flora.  PVR 142 mL.  He was started on Bactrim  DS.  He then sought treatment in the emergency department on April 17, 2024 as he felt the left groin pain was becoming worse.  A scrotal ultrasound performed noted imaging findings consistent with a left-sided epididymal orchitis and bilateral hydroceles.  The left hydrocele was minimally complex with thin septations and likely reactive given the findings of epididymal orchitis.  His CBC did not note any leukocytosis.  His BMP just  noted a slightly elevated BUN.  His UA was yellow cloudy, specific gravity 1.023, pH 5.0, glucose greater than 500, small heme, 30 protein, large leukocyte, 21-50 RBCs, greater than 50 WBC's, and 0-5 squames.  Urine culture grew out multiple species.  He was switched from Bactrim  to Levaquin  7 and 50 mg daily.  He was also given oxycodone  IR 5 mg every 4 hours as needed for pain.  He called the office stating that his testicular pain was an 8 out of 10.  He states it happened when he was sitting and going to a standing position.  Today,  he states the pain is a 4 out of 10.  He will take his last Levaquin  today.  Patient denies any modifying or aggravating factors.  Patient denies any recent UTI's, gross hematuria, dysuria or suprapubic/flank pain.  Patient denies any fevers, chills, nausea or vomiting.    PMH: Past Medical History:  Diagnosis Date   Anxiety    a.) on BZO (alprazolam ) PRN   Aortic atherosclerosis    BPH with urinary obstruction    C. difficile colitis    CAD (coronary artery disease)    Cerebral microvascular disease    Coronary artery disease    DDD (degenerative disc disease), lumbar    a.) s/p lumbar decompression and fusion (L4-5); BILATERAL partial facetectomy and foraminotomies.   Depression    Diverticulitis    Environmental allergies    GERD (gastroesophageal reflux disease)  Goiter    intrathoracic, s/p benign biopsy (Dr Eletha)   HFrEF (heart failure with reduced ejection fraction) (HCC)    Hypercholesterolemia    Hypertension    Implantable loop recorder present 06/2023   Insomnia    a.) uses trazodone  PRN   LBBB (left bundle branch block)    Long-term use of aspirin  therapy    Male hypogonadism    a.) on exogenous TRT (depotestosterone cypionate)   NICM (nonischemic cardiomyopathy) (HCC)    NSVT (nonsustained ventricular tachycardia) (HCC)    OSA on CPAP    Osteoarthritis    cervical spine, lumbar spine   PAF (paroxysmal atrial fibrillation) (HCC)  2002   a.) Dx'd 2002 - likely lone A.fib per cardiology; b.) CHA2DS2VASc = 8 (age x 2, HFrEF,  HTN, CVA/TIA x 2, vascular disease, T2DM) as of 03/09/2024; c.) rate/rhythm maintained on oral carvedilol ; no chronic OAC   Pneumonia    PONV (postoperative nausea and vomiting)    PSVT (paroxysmal supraventricular tachycardia)    Status post bilateral cataract extraction    Stroke (HCC)    T2DM (type 2 diabetes mellitus) (HCC)    TIA (transient ischemic attack)     Surgical History: Past Surgical History:  Procedure Laterality Date   BACK SURGERY  12/2006   s/p fusion of L4-L5   BACK SURGERY  2024   capsule endoscopy     CATARACT EXTRACTION  2011   Dr. Cleatus   CERVICAL DISC SURGERY  2002   CHOLECYSTECTOMY  2024   COLONOSCOPY WITH PROPOFOL  N/A 07/20/2017   Procedure: COLONOSCOPY WITH PROPOFOL ;  Surgeon: Viktoria Lamar DASEN, MD;  Location: [facility] ENDOSCOPY;  Service: Endoscopy;  Laterality: N/A;   COLONOSCOPY WITH PROPOFOL  N/A 07/27/2023   Procedure: COLONOSCOPY WITH PROPOFOL ;  Surgeon: Maryruth Ole DASEN, MD;  Location: [facility] ENDOSCOPY;  Service: Endoscopy;  Laterality: N/A;   EYE SURGERY     cataract bilateral 10/1999   eyelid reduction     Dr. Gretta   gallbladder removed  12/2022   HOLEP-LASER ENUCLEATION OF THE PROSTATE WITH MORCELLATION N/A 03/14/2024   Procedure: ENUCLEATION, PROSTATE, USING LASER, WITH MORCELLATION;  Surgeon: Francisca Redell BROCKS, MD;  Location: [facility] ORS;  Service: Urology;  Laterality: N/A;   INGUINAL HERNIA REPAIR  1991   Dr, Claudene   LEFT HEART CATH AND CORONARY ANGIOGRAPHY Left 06/02/2023   Procedure: LEFT HEART CATH AND CORONARY ANGIOGRAPHY;  Surgeon: Mady Bruckner, MD;  Location: [facility] INVASIVE CV LAB;  Service: Cardiovascular;  Laterality: Left;   LUMBAR LAMINECTOMY/DECOMPRESSION MICRODISCECTOMY N/A 10/15/2020   Procedure: Laminectomy - Lumbar Two-Lumbar Three - Lumbar Three-Lumbar Four with sublaminar decompression;  Surgeon: Joshua Alm RAMAN, MD;  Location: [facility] OR;   Service: Neurosurgery;  Laterality: N/A;  Laminectomy - Lumbar Two-Lumbar Three - Lumbar Three-Lumbar Four with sublaminar decompression   NOSE SURGERY     turbinate reduction   SEPTOPLASTY  1975   SHOULDER SURGERY  2000   rotator cuff   spinal stimulator      Home Medications:  Allergies as of 04/21/2024       Reactions   Penicillins Hives   Childhood allergy (age 5)   Penicillin G    Other Reaction(s): Weal (disorder)        Medication List        Accurate as of April 21, 2024 12:19 PM. If you have any questions, ask your nurse or doctor.          STOP taking these medications    sulfamethoxazole -trimethoprim   800-160 MG tablet Commonly known as: BACTRIM  DS Stopped by: CLOTILDA Aadvika Konen       TAKE these medications    Accu-Chek Guide Test test strip Generic drug: glucose blood USE AS DIRECTED TO TEST BLOOD GLUCOSE LEVELS TWICE DAILY   ALPRAZolam  0.25 MG tablet Commonly known as: XANAX  TAKE 1 TABLET BY MOUTH ONCE DAILY AS NEEDED   aspirin  EC 81 MG tablet Take 81 mg by mouth every evening. Swallow whole.   atorvastatin  40 MG tablet Commonly known as: LIPITOR TAKE 1 TABLET BY MOUTH ONCE EVERY EVENING   azelastine  0.05 % ophthalmic solution Commonly known as: OPTIVAR  Place 1 drop into both eyes daily.   Azelastine -Fluticasone  137-50 MCG/ACT Susp PLACE 1 SPRAY INTO EACH NOSTRIL ONCE EVERY MORNING AND AT BEDTIME   blood glucose meter kit and supplies Dispense based on patient and insurance preference. Use up to four times daily as directed. (FOR ICD-10 E10.9, E11.9).   carvedilol  25 MG tablet Commonly known as: COREG  Take 1 tablet (25 mg total) by mouth 2 (two) times daily with a meal.   empagliflozin  10 MG Tabs tablet Commonly known as: JARDIANCE  Take 1 tablet (10 mg total) by mouth daily before breakfast.   fluconazole  100 MG tablet Commonly known as: DIFLUCAN  Take 2 Tablets (200mg ) on Day 1, then take 1 tablet (100mg ) x 7 days    fluorometholone 0.1 % ophthalmic suspension Commonly known as: FML Place 1 drop into both eyes daily.   gabapentin  100 MG capsule Commonly known as: NEURONTIN  TAKE 1 CAPSULE BY MOUTH 3 TIMES DAILY   levocetirizine 5 MG tablet Commonly known as: XYZAL  TAKE 1 TABLET BY MOUTH ONCE EVERY EVENING   levofloxacin  750 MG tablet Commonly known as: Levaquin  Take 1 tablet (750 mg total) by mouth daily for 5 days.   metFORMIN  1000 MG tablet Commonly known as: GLUCOPHAGE  TAKE 1 TABLET BY MOUTH TWICE DAILY WITH MEALS   methocarbamol  500 MG tablet Commonly known as: ROBAXIN  Take 1 tablet (500 mg total) by mouth every 8 (eight) hours as needed for muscle spasms.   montelukast  10 MG tablet Commonly known as: SINGULAIR  TAKE 1 TABLET BY MOUTH AT BEDTIME   multivitamin with minerals Tabs tablet Take 1 tablet by mouth in the morning.   oxyCODONE  5 MG immediate release tablet Commonly known as: Roxicodone  Take 1 tablet (5 mg total) by mouth every 8 (eight) hours as needed.   pantoprazole  40 MG tablet Commonly known as: PROTONIX  TAKE 1 TABLET BY MOUTH ONCE DAILY   pioglitazone  15 MG tablet Commonly known as: ACTOS  Take 1 tablet (15 mg total) by mouth daily.   sacubitril -valsartan  49-51 MG Commonly known as: Entresto  Take 1 tablet by mouth 2 (two) times daily.   sertraline  100 MG tablet Commonly known as: ZOLOFT  Take 1 tablet (100 mg total) by mouth daily.   spironolactone  25 MG tablet Commonly known as: ALDACTONE  Take 25 mg by mouth daily.   testosterone  cypionate 200 MG/ML injection Commonly known as: DEPOTESTOSTERONE CYPIONATE INJECT 0.5MLS INTRAMUSCULARLY EVERY 14 DAYS   traZODone  50 MG tablet Commonly known as: DESYREL  TAKE 1 TABLET BY MOUTH AT BEDTIME AS NEEDED FOR SLEEP   Trulicity  3 MG/0.5ML Soaj Generic drug: Dulaglutide  INJECT 3MG  SUBCUTANEOUSLY ONCE A WEEK        Allergies:  Allergies  Allergen Reactions   Penicillins Hives    Childhood allergy (age 84)    Penicillin G     Other Reaction(s): Weal (disorder)    Family History: Family History  Problem Relation Age of Onset   Congestive Heart Failure Father    Heart disease Father        myocardial infarction   Rheumatic fever Father        valvular disease   Heart disease Mother        s/p CABG (age 77)   Kidney disease Sister    Colon cancer Neg Hx    Prostate cancer Neg Hx     Social History:  reports that he has quit smoking. He has never been exposed to tobacco smoke. He has never used smokeless tobacco. He reports current alcohol use. He reports that he does not use drugs.  ROS: Pertinent ROS in HPI  Physical Exam: BP 114/69   Pulse 86   Ht 5' 9 (1.753 m)   Wt 170 lb (77.1 kg)   SpO2 96%   BMI 25.10 kg/m   Constitutional:  Well nourished. Alert and oriented, No acute distress. HEENT: [place] AT, moist mucus membranes.  Trachea midline,  Cardiovascular: No clubbing, cyanosis, or edema. Respiratory: Normal respiratory effort, no increased work of breathing. GU: No CVA tenderness.  No bladder fullness or masses.  Patient with uncircumcised phallus. Foreskin easily retracted  Urethral meatus is patent.  No penile discharge. No penile lesions or rashes. Scrotum without lesions, cysts, rashes and/or edema.  Right testicle located scrotally without masses or tenderness.  Right epididymis is normal.  Left testicle is located scrotally and is tender to palpation and enlarged, left epididymis is tender and indurated.  There is left inguinal lymphadenopathy.  There is no erythema, no crepitus, no fluctuant mass and no drainage.  Testicles are located scrotally bilaterally. No masses are appreciated in the testicles. Left and right epididymis are normal. Neurologic: Grossly intact, no focal deficits, moving all 4 extremities. Psychiatric: Normal mood and affect.   Laboratory Data: See Epic and HPI   I have reviewed the labs.   Pertinent Imaging:  04/14/24 16:01  Scan Result 142    Assessment & Plan:    1. Epididymitis/Orchitis - Patient's exam was not concerning for abscess formation or Fournier's gangrene - Question whether or not the increase of testicular pain was secondary to accidentally squeezing the testicle with sitting or standing - We gave him gentamicin 80 mg IM here in the office and a refill of Levaquin  750 mg daily for five more days - ED triggers discussed (worsening pain, fevers, swelling, vomiting)   2. BPH with LU TS - s/p HoLEP - PVR demonstrates adequate bladder emptying   3. Prostate cancer - has follow up in February with Dr. Francisca   Return in about 1 week (around 04/28/2024) for repeat exam and symptom recheck .  These notes generated with voice recognition software. I apologize for typographical errors.  CLOTILDA HELON RIGGERS  [facility] [address]  [address] [place], [place] [address] [phone]

## 2024-04-24 ENCOUNTER — Ambulatory Visit

## 2024-04-24 DIAGNOSIS — I480 Paroxysmal atrial fibrillation: Secondary | ICD-10-CM

## 2024-04-25 NOTE — Progress Notes (Unsigned)
 04/29/2024 11:10 AM   Ricardo Nash 1945/10/16 [id number]  Referring provider: Glendia Shad, MD [address] [place],  [place] [address]  Urological history: 1. BPH with incontinence - HoLEP (03/2024) - pathology gleason grade 3 + 4 = 7   2. Erectile dysfunction - failed PDE5i's  - failed ICI   3. Hypogonadism - testosterone  cypionate 200 mg/mL, 0.5 cc every 14 days   No chief complaint on file.   HPI: Ricardo Nash is a 78 y.o. man who presents today for testicular pain.    Previous records reviewed.  I saw him on 04/14/2024, after he had been having two days of testicular pain.  On Tuesday, he had a PT session and later that day the left groin started to become painful and his left testicle was extremely tender to touch.  He is wondering if it was precipitated by not being able to void at all on Thanksgiving day, but he passed a piece of tissue Friday am and was able to void freely after the passage of the tissue.   Patient denies any modifying or aggravating factors.  Patient denies any recent UTI's, gross hematuria, dysuria or suprapubic/flank pain.  Patient denies any fevers, chills, or nausea.  He vomited once last night.  UA yellow cloudy, specific gravity 1.010, pH 6.0, 3+ glucose, trace protein, 3 + heme, 2+ leukocytes, > 30 WBC's, 11-30 RBC's, 0-10 epithelial cells and moderate bacteria.  Urine culture grew out mixed urogenital flora.  PVR 142 mL.  He was started on Bactrim  DS.  At his visit on 04/21/2024, he then sought treatment in the emergency department on April 17, 2024 as he felt the left groin pain was becoming worse.  A scrotal ultrasound performed noted imaging findings consistent with a left-sided epididymal orchitis and bilateral hydroceles.  The left hydrocele was minimally complex with thin septations and likely reactive given the findings of epididymal orchitis.  His CBC did not note any leukocytosis.  His BMP just noted  a slightly elevated BUN.  His UA was yellow cloudy, specific gravity 1.023, pH 5.0, glucose greater than 500, small heme, 30 protein, large leukocyte, 21-50 RBCs, greater than 50 WBC's, and 0-5 squames.  Urine culture grew out multiple species.  He was switched from Bactrim  to Levaquin  7 and 50 mg daily.  He was also given oxycodone  IR 5 mg every 4 hours as needed for pain.  He called the office stating that his testicular pain was an 8 out of 10.  He states it happened when he was sitting and going to a standing position.  Today,  he states the pain is a 4 out of 10.  He will take his last Levaquin  today.  Patient denies any modifying or aggravating factors.  Patient denies any recent UTI's, gross hematuria, dysuria or suprapubic/flank pain.  Patient denies any fevers, chills, nausea or vomiting.  He was given an 80 mg of Rocephin  IM here in the office and a refill on the Levaquin  7 and 50 mg daily for 5 more days.    PMH: Past Medical History:  Diagnosis Date   Anxiety    a.) on BZO (alprazolam ) PRN   Aortic atherosclerosis    BPH with urinary obstruction    C. difficile colitis    CAD (coronary artery disease)    Cerebral microvascular disease    Coronary artery disease    DDD (degenerative disc disease), lumbar    a.) s/p lumbar decompression and  fusion (L4-5); BILATERAL partial facetectomy and foraminotomies.   Depression    Diverticulitis    Environmental allergies    GERD (gastroesophageal reflux disease)    Goiter    intrathoracic, s/p benign biopsy (Dr Eletha)   HFrEF (heart failure with reduced ejection fraction) (HCC)    Hypercholesterolemia    Hypertension    Implantable loop recorder present 06/2023   Insomnia    a.) uses trazodone  PRN   LBBB (left bundle branch block)    Long-term use of aspirin  therapy    Male hypogonadism    a.) on exogenous TRT (depotestosterone cypionate)   NICM (nonischemic cardiomyopathy) (HCC)    NSVT (nonsustained ventricular tachycardia) (HCC)     OSA on CPAP    Osteoarthritis    cervical spine, lumbar spine   PAF (paroxysmal atrial fibrillation) (HCC) 2002   a.) Dx'd 2002 - likely lone A.fib per cardiology; b.) CHA2DS2VASc = 8 (age x 2, HFrEF,  HTN, CVA/TIA x 2, vascular disease, T2DM) as of 03/09/2024; c.) rate/rhythm maintained on oral carvedilol ; no chronic OAC   Pneumonia    PONV (postoperative nausea and vomiting)    PSVT (paroxysmal supraventricular tachycardia)    Status post bilateral cataract extraction    Stroke (HCC)    T2DM (type 2 diabetes mellitus) (HCC)    TIA (transient ischemic attack)     Surgical History: Past Surgical History:  Procedure Laterality Date   BACK SURGERY  12/2006   s/p fusion of L4-L5   BACK SURGERY  2024   capsule endoscopy     CATARACT EXTRACTION  2011   Dr. Cleatus   CERVICAL DISC SURGERY  2002   CHOLECYSTECTOMY  2024   COLONOSCOPY WITH PROPOFOL  N/A 07/20/2017   Procedure: COLONOSCOPY WITH PROPOFOL ;  Surgeon: Viktoria Lamar DASEN, MD;  Location[facility] ENDOSCOPY;  Service: Endoscopy;  Laterality: N/A;   COLONOSCOPY WITH PROPOFOL  N/A 07/27/2023   Procedure: COLONOSCOPY WITH PROPOFOL ;  Surgeon: Maryruth Ole DASEN, MD;  Location: [facility] ENDOSCOPY;  Service: Endoscopy;  Laterality: N/A;   EYE SURGERY     cataract bilateral 10/1999   eyelid reduction     Dr. Gretta   gallbladder removed  12/2022   HOLEP-LASER ENUCLEATION OF THE PROSTATE WITH MORCELLATION N/A 03/14/2024   Procedure: ENUCLEATION, PROSTATE, USING LASER, WITH MORCELLATION;  Surgeon: Francisca Redell BROCKS, MD;  Location: [facility] ORS;  Service: Urology;  Laterality: N/A;   INGUINAL HERNIA REPAIR  1991   Dr, Claudene   LEFT HEART CATH AND CORONARY ANGIOGRAPHY Left 06/02/2023   Procedure: LEFT HEART CATH AND CORONARY ANGIOGRAPHY;  Surgeon: Mady Bruckner, MD;  Location: [facility] INVASIVE CV LAB;  Service: Cardiovascular;  Laterality: Left;   LUMBAR LAMINECTOMY/DECOMPRESSION MICRODISCECTOMY N/A 10/15/2020   Procedure: Laminectomy - Lumbar Two-Lumbar  Three - Lumbar Three-Lumbar Four with sublaminar decompression;  Surgeon: Joshua Alm RAMAN, MD;  Location: [facility] OR;  Service: Neurosurgery;  Laterality: N/A;  Laminectomy - Lumbar Two-Lumbar Three - Lumbar Three-Lumbar Four with sublaminar decompression   NOSE SURGERY     turbinate reduction   SEPTOPLASTY  1975   SHOULDER SURGERY  2000   rotator cuff   spinal stimulator      Home Medications:  Allergies as of 04/29/2024       Reactions   Penicillins Hives   Childhood allergy (age 2)   Penicillin G    Other Reaction(s): Weal (disorder)        Medication List        Accurate as of April 25, 2024  11:10 AM. If you have any questions, ask your nurse or doctor.          Accu-Chek Guide Test test strip Generic drug: glucose blood USE AS DIRECTED TO TEST BLOOD GLUCOSE LEVELS TWICE DAILY   ALPRAZolam  0.25 MG tablet Commonly known as: XANAX  TAKE 1 TABLET BY MOUTH ONCE DAILY AS NEEDED   aspirin  EC 81 MG tablet Take 81 mg by mouth every evening. Swallow whole.   atorvastatin  40 MG tablet Commonly known as: LIPITOR TAKE 1 TABLET BY MOUTH ONCE EVERY EVENING   azelastine  0.05 % ophthalmic solution Commonly known as: OPTIVAR  Place 1 drop into both eyes daily.   Azelastine -Fluticasone  137-50 MCG/ACT Susp PLACE 1 SPRAY INTO EACH NOSTRIL ONCE EVERY MORNING AND AT BEDTIME   blood glucose meter kit and supplies Dispense based on patient and insurance preference. Use up to four times daily as directed. (FOR ICD-10 E10.9, E11.9).   carvedilol  25 MG tablet Commonly known as: COREG  Take 1 tablet (25 mg total) by mouth 2 (two) times daily with a meal.   empagliflozin  10 MG Tabs tablet Commonly known as: JARDIANCE  Take 1 tablet (10 mg total) by mouth daily before breakfast.   fluconazole  100 MG tablet Commonly known as: DIFLUCAN  Take 2 Tablets (200mg ) on Day 1, then take 1 tablet (100mg ) x 7 days   fluorometholone 0.1 % ophthalmic suspension Commonly known as: FML Place 1  drop into both eyes daily.   gabapentin  100 MG capsule Commonly known as: NEURONTIN  TAKE 1 CAPSULE BY MOUTH 3 TIMES DAILY   levocetirizine 5 MG tablet Commonly known as: XYZAL  TAKE 1 TABLET BY MOUTH ONCE EVERY EVENING   metFORMIN  1000 MG tablet Commonly known as: GLUCOPHAGE  TAKE 1 TABLET BY MOUTH TWICE DAILY WITH MEALS   methocarbamol  500 MG tablet Commonly known as: ROBAXIN  Take 1 tablet (500 mg total) by mouth every 8 (eight) hours as needed for muscle spasms.   montelukast  10 MG tablet Commonly known as: SINGULAIR  TAKE 1 TABLET BY MOUTH AT BEDTIME   multivitamin with minerals Tabs tablet Take 1 tablet by mouth in the morning.   oxyCODONE  5 MG immediate release tablet Commonly known as: Roxicodone  Take 1 tablet (5 mg total) by mouth every 8 (eight) hours as needed.   pantoprazole  40 MG tablet Commonly known as: PROTONIX  TAKE 1 TABLET BY MOUTH ONCE DAILY   pioglitazone  15 MG tablet Commonly known as: ACTOS  Take 1 tablet (15 mg total) by mouth daily.   sacubitril -valsartan  49-51 MG Commonly known as: Entresto  Take 1 tablet by mouth 2 (two) times daily.   sertraline  100 MG tablet Commonly known as: ZOLOFT  Take 1 tablet (100 mg total) by mouth daily.   spironolactone  25 MG tablet Commonly known as: ALDACTONE  Take 25 mg by mouth daily.   testosterone  cypionate 200 MG/ML injection Commonly known as: DEPOTESTOSTERONE CYPIONATE INJECT 0.5MLS INTRAMUSCULARLY EVERY 14 DAYS   traZODone  50 MG tablet Commonly known as: DESYREL  TAKE 1 TABLET BY MOUTH AT BEDTIME AS NEEDED FOR SLEEP   Trulicity  3 MG/0.5ML Soaj Generic drug: Dulaglutide  INJECT 3MG  SUBCUTANEOUSLY ONCE A WEEK        Allergies:  Allergies  Allergen Reactions   Penicillins Hives    Childhood allergy (age 47)   Penicillin G     Other Reaction(s): Weal (disorder)    Family History: Family History  Problem Relation Age of Onset   Congestive Heart Failure Father    Heart disease Father         myocardial  infarction   Rheumatic fever Father        valvular disease   Heart disease Mother        s/p CABG (age 67)   Kidney disease Sister    Colon cancer Neg Hx    Prostate cancer Neg Hx     Social History:  reports that he has quit smoking. He has never been exposed to tobacco smoke. He has never used smokeless tobacco. He reports current alcohol use. He reports that he does not use drugs.  ROS: Pertinent ROS in HPI  Physical Exam: There were no vitals taken for this visit.  Constitutional:  Well nourished. Alert and oriented, No acute distress. HEENT: [place] AT, moist mucus membranes.  Trachea midline, no masses. Cardiovascular: No clubbing, cyanosis, or edema. Respiratory: Normal respiratory effort, no increased work of breathing. GI: Abdomen is soft, non tender, non distended, no abdominal masses. Liver and spleen not palpable.  No hernias appreciated.  Stool sample for occult testing is not indicated.   GU: No CVA tenderness.  No bladder fullness or masses.  Patient with circumcised/uncircumcised phallus. ***Foreskin easily retracted***  Urethral meatus is patent.  No penile discharge. No penile lesions or rashes. Scrotum without lesions, cysts, rashes and/or edema.  Testicles are located scrotally bilaterally. No masses are appreciated in the testicles. Left and right epididymis are normal. Rectal: Patient with  normal sphincter tone. Anus and perineum without scarring or rashes. No rectal masses are appreciated. Prostate is approximately *** grams, *** nodules are appreciated. Seminal vesicles are normal. Skin: No rashes, bruises or suspicious lesions. Lymph: No cervical or inguinal adenopathy. Neurologic: Grossly intact, no focal deficits, moving all 4 extremities. Psychiatric: Normal mood and affect.   Laboratory Data: See Epic and HPI   I have reviewed the labs.   Pertinent Imaging: N/A  Assessment & Plan:    1. Epididymitis/Orchitis - ***  2. BPH with LU TS -  s/p HoLEP - PVR's  demonstrates adequate bladder emptying   3. Prostate cancer - has follow up in February with Dr. Francisca   No follow-ups on file.  These notes generated with voice recognition software. I apologize for typographical errors.  CLOTILDA HELON RIGGERS  [facility] [address]  [address] [place], [place] [address] [phone]

## 2024-04-26 LAB — CUP PACEART REMOTE DEVICE CHECK
Date Time Interrogation Session: 20251214002800
Implantable Pulse Generator Implant Date: 20250225
Pulse Gen Serial Number: 135133

## 2024-04-29 ENCOUNTER — Ambulatory Visit: Payer: Self-pay | Admitting: Cardiology

## 2024-04-29 ENCOUNTER — Ambulatory Visit: Admitting: Urology

## 2024-04-29 VITALS — BP 115/67 | HR 88 | Wt 164.7 lb

## 2024-04-29 DIAGNOSIS — N401 Enlarged prostate with lower urinary tract symptoms: Secondary | ICD-10-CM | POA: Diagnosis not present

## 2024-04-29 DIAGNOSIS — N138 Other obstructive and reflux uropathy: Secondary | ICD-10-CM

## 2024-04-29 DIAGNOSIS — R310 Gross hematuria: Secondary | ICD-10-CM

## 2024-04-29 DIAGNOSIS — C61 Malignant neoplasm of prostate: Secondary | ICD-10-CM | POA: Diagnosis not present

## 2024-04-29 DIAGNOSIS — N453 Epididymo-orchitis: Secondary | ICD-10-CM

## 2024-04-29 LAB — URINALYSIS, COMPLETE
Bilirubin, UA: NEGATIVE
Glucose, UA: NEGATIVE
Ketones, UA: NEGATIVE
Nitrite, UA: NEGATIVE
Specific Gravity, UA: 1.025 (ref 1.005–1.030)
Urobilinogen, Ur: 0.2 mg/dL (ref 0.2–1.0)
pH, UA: 6 (ref 5.0–7.5)

## 2024-04-29 LAB — MICROSCOPIC EXAMINATION
RBC, Urine: >30 /HPF — ABNORMAL HIGH (ref 0–2)
WBC, UA: >30 /HPF — ABNORMAL HIGH (ref 0–5)

## 2024-04-29 NOTE — Progress Notes (Signed)
 Remote Loop Recorder Transmission

## 2024-05-01 ENCOUNTER — Encounter: Payer: Self-pay | Admitting: Urology

## 2024-05-02 ENCOUNTER — Other Ambulatory Visit: Payer: Self-pay

## 2024-05-03 ENCOUNTER — Ambulatory Visit: Payer: Self-pay | Admitting: Urology

## 2024-05-03 LAB — CULTURE, URINE COMPREHENSIVE

## 2024-05-13 ENCOUNTER — Other Ambulatory Visit

## 2024-05-13 DIAGNOSIS — E1159 Type 2 diabetes mellitus with other circulatory complications: Secondary | ICD-10-CM | POA: Diagnosis not present

## 2024-05-13 DIAGNOSIS — E7800 Pure hypercholesterolemia, unspecified: Secondary | ICD-10-CM

## 2024-05-13 DIAGNOSIS — C61 Malignant neoplasm of prostate: Secondary | ICD-10-CM | POA: Diagnosis not present

## 2024-05-13 LAB — LIPID PANEL
Cholesterol: 96 mg/dL (ref 28–200)
HDL: 31.4 mg/dL — ABNORMAL LOW
LDL Cholesterol: 37 mg/dL (ref 10–99)
NonHDL: 65.04
Total CHOL/HDL Ratio: 3
Triglycerides: 139 mg/dL (ref 10.0–149.0)
VLDL: 27.8 mg/dL (ref 0.0–40.0)

## 2024-05-13 LAB — MICROALBUMIN / CREATININE URINE RATIO
Creatinine,U: 65.6 mg/dL
Microalb Creat Ratio: 400.4 mg/g — ABNORMAL HIGH (ref 0.0–30.0)
Microalb, Ur: 26.3 mg/dL — ABNORMAL HIGH (ref 0.7–1.9)

## 2024-05-13 LAB — HEPATIC FUNCTION PANEL
ALT: 13 U/L (ref 3–53)
AST: 15 U/L (ref 5–37)
Albumin: 3.7 g/dL (ref 3.5–5.2)
Alkaline Phosphatase: 56 U/L (ref 39–117)
Bilirubin, Direct: 0.1 mg/dL (ref 0.1–0.3)
Total Bilirubin: 0.6 mg/dL (ref 0.2–1.2)
Total Protein: 6.1 g/dL (ref 6.0–8.3)

## 2024-05-13 LAB — BASIC METABOLIC PANEL WITH GFR
BUN: 16 mg/dL (ref 6–23)
CO2: 30 meq/L (ref 19–32)
Calcium: 9.2 mg/dL (ref 8.4–10.5)
Chloride: 103 meq/L (ref 96–112)
Creatinine, Ser: 0.74 mg/dL (ref 0.40–1.50)
GFR: 85.91 mL/min
Glucose, Bld: 112 mg/dL — ABNORMAL HIGH (ref 70–99)
Potassium: 4.5 meq/L (ref 3.5–5.1)
Sodium: 142 meq/L (ref 135–145)

## 2024-05-13 LAB — HEMOGLOBIN A1C: Hgb A1c MFr Bld: 6.3 % (ref 4.6–6.5)

## 2024-05-14 ENCOUNTER — Other Ambulatory Visit: Payer: Self-pay | Admitting: Internal Medicine

## 2024-05-17 ENCOUNTER — Encounter: Payer: Self-pay | Admitting: Internal Medicine

## 2024-05-17 ENCOUNTER — Ambulatory Visit: Admitting: Internal Medicine

## 2024-05-17 VITALS — BP 124/58 | HR 80 | Temp 98.2°F | Ht 69.0 in | Wt 168.4 lb

## 2024-05-17 DIAGNOSIS — E1159 Type 2 diabetes mellitus with other circulatory complications: Secondary | ICD-10-CM | POA: Diagnosis not present

## 2024-05-17 DIAGNOSIS — D509 Iron deficiency anemia, unspecified: Secondary | ICD-10-CM

## 2024-05-17 DIAGNOSIS — M1990 Unspecified osteoarthritis, unspecified site: Secondary | ICD-10-CM

## 2024-05-17 DIAGNOSIS — F439 Reaction to severe stress, unspecified: Secondary | ICD-10-CM

## 2024-05-17 DIAGNOSIS — I429 Cardiomyopathy, unspecified: Secondary | ICD-10-CM

## 2024-05-17 DIAGNOSIS — I480 Paroxysmal atrial fibrillation: Secondary | ICD-10-CM

## 2024-05-17 DIAGNOSIS — K219 Gastro-esophageal reflux disease without esophagitis: Secondary | ICD-10-CM

## 2024-05-17 DIAGNOSIS — F419 Anxiety disorder, unspecified: Secondary | ICD-10-CM

## 2024-05-17 DIAGNOSIS — I2510 Atherosclerotic heart disease of native coronary artery without angina pectoris: Secondary | ICD-10-CM

## 2024-05-17 DIAGNOSIS — N451 Epididymitis: Secondary | ICD-10-CM | POA: Diagnosis not present

## 2024-05-17 DIAGNOSIS — I10 Essential (primary) hypertension: Secondary | ICD-10-CM | POA: Diagnosis not present

## 2024-05-17 DIAGNOSIS — G4733 Obstructive sleep apnea (adult) (pediatric): Secondary | ICD-10-CM

## 2024-05-17 DIAGNOSIS — E7800 Pure hypercholesterolemia, unspecified: Secondary | ICD-10-CM

## 2024-05-17 MED ORDER — PIOGLITAZONE HCL 15 MG PO TABS: 15.0000 mg | ORAL_TABLET | Freq: Every day | ORAL | 1 refills | Status: AC

## 2024-05-17 MED ORDER — CARVEDILOL 25 MG PO TABS: 25.0000 mg | ORAL_TABLET | Freq: Two times a day (BID) | ORAL | 1 refills | Status: AC

## 2024-05-17 MED ORDER — GABAPENTIN 100 MG PO CAPS: 100.0000 mg | ORAL_CAPSULE | Freq: Three times a day (TID) | ORAL | 1 refills | Status: AC

## 2024-05-17 MED ORDER — MONTELUKAST SODIUM 10 MG PO TABS: 10.0000 mg | ORAL_TABLET | Freq: Every day | ORAL | 2 refills | Status: AC

## 2024-05-17 MED ORDER — TRULICITY 3 MG/0.5ML ~~LOC~~ SOAJ: 3.0000 mg | SUBCUTANEOUS | 1 refills | Status: AC

## 2024-05-17 MED ORDER — LEVOCETIRIZINE DIHYDROCHLORIDE 5 MG PO TABS: 5.0000 mg | ORAL_TABLET | Freq: Every evening | ORAL | 2 refills | Status: AC

## 2024-05-17 MED ORDER — PANTOPRAZOLE SODIUM 40 MG PO TBEC: 40.0000 mg | DELAYED_RELEASE_TABLET | Freq: Every day | ORAL | 1 refills | Status: AC

## 2024-05-17 MED ORDER — TRAZODONE HCL 50 MG PO TABS: 50.0000 mg | ORAL_TABLET | Freq: Every evening | ORAL | 1 refills | Status: AC | PRN

## 2024-05-17 MED ORDER — METFORMIN HCL 1000 MG PO TABS: 1000.0000 mg | ORAL_TABLET | Freq: Two times a day (BID) | ORAL | 1 refills | Status: AC

## 2024-05-17 MED ORDER — SERTRALINE HCL 100 MG PO TABS: 100.0000 mg | ORAL_TABLET | Freq: Every day | ORAL | 1 refills | Status: AC

## 2024-05-17 NOTE — Progress Notes (Signed)
 "  Subjective:    Patient ID: Ricardo Nash, male    DOB: March 06, 1946, 79 y.o.   MRN: [id number]  Patient here for  Chief Complaint  Patient presents with   Medical Management of Chronic Issues    HPI Here for a scheduled follow up - follow up regarding diabetes, hypertension, hypercholesterolemia and afib. Recently evaluated by urology ( and ER) - left groin pain. A scrotal ultrasound performed noted imaging findings consistent with a left-sided epididymal orchitis and bilateral hydroceles. Treated initially with bactrim  and changed to levaquin . F/u 12/19 - epididymitis/orchitis resolving. Had f/u with ortho 04/20/24 - left hip pain - s/p steroid injection. Saw rheumatology 03/21/24 - polyarthralgia. Felt most of his pain due to OA and degenerative joint disease. Recommended celebrex . Xrays. Seeing Dr Francisca - recent HOLEP  (03/16/24). No dribbling now. No pain now. Off abx. No diarrhea now. Overall feeling better. Reviewed outside blood pressures and sugars. Discussed labs. Recent A1c improved - 6.2.    Past Medical History:  Diagnosis Date   Anxiety    a.) on BZO (alprazolam ) PRN   Aortic atherosclerosis    Blood transfusion without reported diagnosis 1979   BPH with urinary obstruction    C. difficile colitis    CAD (coronary artery disease)    Cataract 2003   Cerebral microvascular disease    Coronary artery disease    DDD (degenerative disc disease), lumbar    a.) s/p lumbar decompression and fusion (L4-5); BILATERAL partial facetectomy and foraminotomies.   Depression    Diverticulitis    Environmental allergies    GERD (gastroesophageal reflux disease)    Goiter    intrathoracic, s/p benign biopsy (Dr Eletha)   HFrEF (heart failure with reduced ejection fraction) (HCC)    Hypercholesterolemia    Hypertension    Implantable loop recorder present 06/2023   Insomnia    a.) uses trazodone  PRN   LBBB (left bundle branch block)    Long-term use of aspirin  therapy     Male hypogonadism    a.) on exogenous TRT (depotestosterone cypionate)   NICM (nonischemic cardiomyopathy) (HCC)    NSVT (nonsustained ventricular tachycardia) (HCC)    OSA on CPAP    Osteoarthritis    cervical spine, lumbar spine   PAF (paroxysmal atrial fibrillation) (HCC) 2002   a.) Dx'd 2002 - likely lone A.fib per cardiology; b.) CHA2DS2VASc = 8 (age x 2, HFrEF,  HTN, CVA/TIA x 2, vascular disease, T2DM) as of 03/09/2024; c.) rate/rhythm maintained on oral carvedilol ; no chronic OAC   Pneumonia    PONV (postoperative nausea and vomiting)    PSVT (paroxysmal supraventricular tachycardia)    Sleep apnea    Status post bilateral cataract extraction    Stroke (HCC)    T2DM (type 2 diabetes mellitus) (HCC)    TIA (transient ischemic attack)    Past Surgical History:  Procedure Laterality Date   BACK SURGERY  12/2006   s/p fusion of L4-L5   BACK SURGERY  2024   capsule endoscopy     CATARACT EXTRACTION  2011   Dr. Cleatus   CERVICAL DISC SURGERY  2002   CHOLECYSTECTOMY  2024   COLONOSCOPY WITH PROPOFOL  N/A 07/20/2017   Procedure: COLONOSCOPY WITH PROPOFOL ;  Surgeon: Viktoria Lamar DASEN, MD;  Location: [facility] ENDOSCOPY;  Service: Endoscopy;  Laterality: N/A;   COLONOSCOPY WITH PROPOFOL  N/A 07/27/2023   Procedure: COLONOSCOPY WITH PROPOFOL ;  Surgeon: Maryruth Ole DASEN, MD;  Location: [facility] ENDOSCOPY;  Service: Endoscopy;  Laterality:  N/A;   EYE SURGERY     cataract bilateral 10/1999   eyelid reduction     Dr. Gretta   gallbladder removed  12/2022   HOLEP-LASER ENUCLEATION OF THE PROSTATE WITH MORCELLATION N/A 03/14/2024   Procedure: ENUCLEATION, PROSTATE, USING LASER, WITH MORCELLATION;  Surgeon: Francisca Redell BROCKS, MD;  Location: [facility] ORS;  Service: Urology;  Laterality: N/A;   INGUINAL HERNIA REPAIR  1991   Dr, Claudene   LEFT HEART CATH AND CORONARY ANGIOGRAPHY Left 06/02/2023   Procedure: LEFT HEART CATH AND CORONARY ANGIOGRAPHY;  Surgeon: Mady Bruckner, MD;  Location: [facility]  INVASIVE CV LAB;  Service: Cardiovascular;  Laterality: Left;   LUMBAR LAMINECTOMY/DECOMPRESSION MICRODISCECTOMY N/A 10/15/2020   Procedure: Laminectomy - Lumbar Two-Lumbar Three - Lumbar Three-Lumbar Four with sublaminar decompression;  Surgeon: Joshua Alm RAMAN, MD;  Location: [facility] OR;  Service: Neurosurgery;  Laterality: N/A;  Laminectomy - Lumbar Two-Lumbar Three - Lumbar Three-Lumbar Four with sublaminar decompression   NOSE SURGERY     turbinate reduction   SEPTOPLASTY  1975   SHOULDER SURGERY  2000   rotator cuff   spinal stimulator     SPINE SURGERY  2002 2008 2012   Family History  Problem Relation Age of Onset   Congestive Heart Failure Father    Heart disease Father        myocardial infarction   Rheumatic fever Father        valvular disease   Arthritis Father    Heart disease Mother        s/p CABG (age 26)   Arthritis Mother    Kidney disease Sister    Colon cancer Neg Hx    Prostate cancer Neg Hx    Social History   Socioeconomic History   Marital status: Married    Spouse name: Not on file   Number of children: 2   Years of education: Not on file   Highest education level: Not on file  Occupational History   Occupation: retired runner, broadcasting/film/video  Tobacco Use   Smoking status: Former    Passive exposure: Never   Smokeless tobacco: Never  Vaping Use   Vaping status: Never Used  Substance and Sexual Activity   Alcohol use: Yes    Alcohol/week: 0.0 standard drinks of alcohol    Comment: occasional   Drug use: No   Sexual activity: Not Currently  Other Topics Concern   Not on file  Social History Narrative   Not on file   Social Drivers of Health   Tobacco Use: Medium Risk (05/22/2024)   Patient History    Smoking Tobacco Use: Former    Smokeless Tobacco Use: Never    Passive Exposure: Never  Physicist, Medical Strain: Low Risk  (04/20/2024)   Received from [facility]   Overall Financial Resource Strain (CARDIA)    Difficulty of Paying  Living Expenses: Not hard at all  Food Insecurity: No Food Insecurity (04/20/2024)   Received from [facility]   Epic    Within the past 12 months, you worried that your food would run out before you got the money to buy more.: Never true    Within the past 12 months, the food you bought just didn't last and you didn't have money to get more.: Never true  Transportation Needs: No Transportation Needs (04/20/2024)   Received from [facility] - Transportation    In the past 12 months, has lack of transportation kept  you from medical appointments or from getting medications?: No    Lack of Transportation (Non-Medical): No  Physical Activity: Inactive (07/01/2023)   Exercise Vital Sign    Days of Exercise per Week: 0 days    Minutes of Exercise per Session: 0 min  Stress: No Stress Concern Present (07/01/2023)   [employer] - Occupational Stress Questionnaire    Feeling of Stress : Only a little  Social Connections: Socially Integrated (07/01/2023)   Social Connection and Isolation Panel    Frequency of Communication with Friends and Family: More than three times a week    Frequency of Social Gatherings with Friends and Family: Three times a week    Attends Religious Services: More than 4 times per year    Active Member of Clubs or Organizations: Yes    Attends Banker Meetings: More than 4 times per year    Marital Status: Married  Depression (PHQ2-9): Low Risk (05/17/2024)   Depression (PHQ2-9)    PHQ-2 Score: 4  Alcohol Screen: Low Risk (07/01/2023)   Alcohol Screen    Last Alcohol Screening Score (AUDIT): 1  Housing: Low Risk  (04/20/2024)   Received from [facility]   Epic    In the last 12 months, was there a time when you were not able to pay the mortgage or rent on time?: No    In the past 12 months, how many times have you moved where you were living?: 0    At any time in the  past 12 months, were you homeless or living in a shelter (including now)?: No  Utilities: Not At Risk (04/20/2024)   Received from [facility]   Epic    In the past 12 months has the electric, gas, oil, or water  company threatened to shut off services in your home?: No  Health Literacy: Adequate Health Literacy (07/01/2023)   B1300 Health Literacy    Frequency of need for help with medical instructions: Never     Review of Systems  Constitutional:  Negative for appetite change and unexpected weight change.  HENT:  Negative for congestion and sinus pressure.   Respiratory:  Negative for cough, chest tightness and shortness of breath.   Cardiovascular:  Negative for chest pain, palpitations and leg swelling.  Gastrointestinal:  Negative for abdominal pain, diarrhea, nausea and vomiting.  Genitourinary:  Negative for difficulty urinating and dysuria.  Musculoskeletal:  Negative for joint swelling and myalgias.  Skin:  Negative for color change and rash.  Neurological:  Negative for dizziness and headaches.  Psychiatric/Behavioral:  Negative for agitation and dysphoric mood.        Objective:     BP (!) 124/58   Pulse 80   Temp 98.2 F (36.8 C) (Oral)   Ht 5' 9 (1.753 m)   Wt 168 lb 6.4 oz (76.4 kg)   SpO2 98%   BMI 24.87 kg/m  Wt Readings from Last 3 Encounters:  05/17/24 168 lb 6.4 oz (76.4 kg)  04/29/24 164 lb 11.2 oz (74.7 kg)  04/21/24 170 lb (77.1 kg)    Physical Exam Constitutional:      General: He is not in acute distress.    Appearance: Normal appearance. He is well-developed.  HENT:     Head: Normocephalic and atraumatic.     Right Ear: External ear normal.     Left Ear: External ear normal.     Mouth/Throat:     Pharynx:  No oropharyngeal exudate or posterior oropharyngeal erythema.  Eyes:     General: No scleral icterus.       Right eye: No discharge.        Left eye: No discharge.  Cardiovascular:     Rate and Rhythm: Normal rate  and regular rhythm.  Pulmonary:     Effort: Pulmonary effort is normal. No respiratory distress.     Breath sounds: Normal breath sounds.  Abdominal:     General: Bowel sounds are normal.     Palpations: Abdomen is soft.     Tenderness: There is no abdominal tenderness.  Musculoskeletal:        General: No swelling or tenderness.     Cervical back: Neck supple. No tenderness.  Lymphadenopathy:     Cervical: No cervical adenopathy.  Skin:    Findings: No erythema or rash.  Neurological:     Mental Status: He is alert.  Psychiatric:        Mood and Affect: Mood normal.        Behavior: Behavior normal.         Outpatient Encounter Medications as of 05/17/2024  Medication Sig   ACCU-CHEK GUIDE TEST test strip USE AS DIRECTED TO TEST BLOOD GLUCOSE LEVELS TWICE DAILY   ALPRAZolam  (XANAX ) 0.25 MG tablet TAKE 1 TABLET BY MOUTH ONCE DAILY AS NEEDED   aspirin  EC 81 MG tablet Take 81 mg by mouth every evening. Swallow whole.   atorvastatin  (LIPITOR) 40 MG tablet TAKE 1 TABLET BY MOUTH ONCE EVERY EVENING   azelastine  (OPTIVAR ) 0.05 % ophthalmic solution Place 1 drop into both eyes daily.   Azelastine -Fluticasone  137-50 MCG/ACT SUSP PLACE 1 SPRAY INTO EACH NOSTRIL ONCE EVERY MORNING AND AT BEDTIME   blood glucose meter kit and supplies Dispense based on patient and insurance preference. Use up to four times daily as directed. (FOR ICD-10 E10.9, E11.9).   celecoxib  (CELEBREX ) 100 MG capsule Take 100 mg by mouth 2 (two) times daily.   empagliflozin  (JARDIANCE ) 10 MG TABS tablet Take 1 tablet (10 mg total) by mouth daily before breakfast.   fluorometholone (FML) 0.1 % ophthalmic suspension Place 1 drop into both eyes daily.   Multiple Vitamin (MULTIVITAMIN WITH MINERALS) TABS tablet Take 1 tablet by mouth in the morning.   sacubitril -valsartan  (ENTRESTO ) 49-51 MG Take 1 tablet by mouth 2 (two) times daily.   spironolactone  (ALDACTONE ) 25 MG tablet Take 25 mg by mouth daily.   testosterone   cypionate (DEPOTESTOSTERONE CYPIONATE) 200 MG/ML injection INJECT 0.5MLS INTRAMUSCULARLY EVERY 14 DAYS   carvedilol  (COREG ) 25 MG tablet Take 1 tablet (25 mg total) by mouth 2 (two) times daily with a meal.   Dulaglutide  (TRULICITY ) 3 MG/0.5ML SOAJ Inject 3 mg into the skin once a week.   gabapentin  (NEURONTIN ) 100 MG capsule Take 1 capsule (100 mg total) by mouth 3 (three) times daily.   levocetirizine (XYZAL ) 5 MG tablet Take 1 tablet (5 mg total) by mouth every evening.   metFORMIN  (GLUCOPHAGE ) 1000 MG tablet Take 1 tablet (1,000 mg total) by mouth 2 (two) times daily with a meal.   montelukast  (SINGULAIR ) 10 MG tablet Take 1 tablet (10 mg total) by mouth at bedtime.   pantoprazole  (PROTONIX ) 40 MG tablet Take 1 tablet (40 mg total) by mouth daily.   pioglitazone  (ACTOS ) 15 MG tablet Take 1 tablet (15 mg total) by mouth daily.   sertraline  (ZOLOFT ) 100 MG tablet Take 1 tablet (100 mg total) by mouth daily.   traZODone  (  DESYREL ) 50 MG tablet Take 1 tablet (50 mg total) by mouth at bedtime as needed. for sleep   [DISCONTINUED] carvedilol  (COREG ) 25 MG tablet Take 1 tablet (25 mg total) by mouth 2 (two) times daily with a meal.   [DISCONTINUED] fluconazole  (DIFLUCAN ) 100 MG tablet Take 2 Tablets (200mg ) on Day 1, then take 1 tablet (100mg ) x 7 days (Patient not taking: Reported on 04/29/2024)   [DISCONTINUED] gabapentin  (NEURONTIN ) 100 MG capsule TAKE 1 CAPSULE BY MOUTH 3 TIMES DAILY   [DISCONTINUED] levocetirizine (XYZAL ) 5 MG tablet TAKE 1 TABLET BY MOUTH ONCE EVERY EVENING   [DISCONTINUED] metFORMIN  (GLUCOPHAGE ) 1000 MG tablet TAKE 1 TABLET BY MOUTH TWICE DAILY WITH MEALS   [DISCONTINUED] methocarbamol  (ROBAXIN ) 500 MG tablet Take 1 tablet (500 mg total) by mouth every 8 (eight) hours as needed for muscle spasms. (Patient not taking: Reported on 05/17/2024)   [DISCONTINUED] montelukast  (SINGULAIR ) 10 MG tablet TAKE 1 TABLET BY MOUTH AT BEDTIME   [DISCONTINUED] oxyCODONE  (ROXICODONE ) 5 MG immediate  release tablet Take 1 tablet (5 mg total) by mouth every 8 (eight) hours as needed.   [DISCONTINUED] pantoprazole  (PROTONIX ) 40 MG tablet TAKE 1 TABLET BY MOUTH ONCE DAILY   [DISCONTINUED] pioglitazone  (ACTOS ) 15 MG tablet Take 1 tablet (15 mg total) by mouth daily.   [DISCONTINUED] sertraline  (ZOLOFT ) 100 MG tablet Take 1 tablet (100 mg total) by mouth daily.   [DISCONTINUED] traZODone  (DESYREL ) 50 MG tablet TAKE 1 TABLET BY MOUTH AT BEDTIME AS NEEDED FOR SLEEP   [DISCONTINUED] TRULICITY  3 MG/0.5ML SOAJ INJECT 3MG  SUBCUTANEOUSLY ONCE A WEEK   No facility-administered encounter medications on file as of 05/17/2024.     Lab Results  Component Value Date   WBC 10.0 04/17/2024   HGB 11.4 (L) 04/17/2024   HCT 37.4 (L) 04/17/2024   PLT 318 04/17/2024   GLUCOSE 112 (H) 05/13/2024   CHOL 96 05/13/2024   TRIG 139.0 05/13/2024   HDL 31.40 (L) 05/13/2024   LDLDIRECT 72.0 10/08/2022   LDLCALC 37 05/13/2024   ALT 13 05/13/2024   AST 15 05/13/2024   NA 142 05/13/2024   K 4.5 05/13/2024   CL 103 05/13/2024   CREATININE 0.74 05/13/2024   BUN 16 05/13/2024   CO2 30 05/13/2024   TSH 0.71 09/30/2023   PSA 0.82 06/10/2019   INR 0.9 10/11/2020   HGBA1C 6.3 05/13/2024   MICROALBUR 26.3 (H) 05/13/2024    US  SCROTUM W/DOPPLER Result Date: 04/17/2024 CLINICAL DATA:  Testicular pain radiating to left hip, recent prostate surgery EXAM: SCROTAL ULTRASOUND DOPPLER ULTRASOUND OF THE TESTICLES TECHNIQUE: Complete ultrasound examination of the testicles, epididymis, and other scrotal structures was performed. Color and spectral Doppler ultrasound were also utilized to evaluate blood flow to the testicles. COMPARISON:  None Available. FINDINGS: Right testicle Measurements: 3.3 x 2.1 x 2.2 cm. No mass or microlithiasis visualized. Doppler: There is normal vascularity on color doppler examination. Spectral doppler arterial and venous waveforms are normal. Left testicle Measurements:  2.9 x 2.6 x 2.4 cm. No mass or  microlithiasis visualized. Slightly heterogeneous appearance of the testicle may reflect edema. Doppler: The left testicle is hypervascular. Spectral doppler arterial and venous waveforms are normal. Right epididymis:  Normal in size and appearance. Left epididymis: The left epididymis is heterogeneous and enlarged, with increased vascularity on color Doppler imaging. Hydrocele: There is a trace simple right-sided hydrocele. A small complex left hydrocele is identified with multiple thin septations. Varicocele:  None visualized. IMPRESSION: 1. Imaging findings consistent with left-sided epididymo-orchitis.  2. No evidence of testicular torsion. 3. Bilateral hydroceles. Left hydrocele is minimally complex with thin septations, likely reactive given findings of epididymo-orchitis. Electronically Signed   By: Ozell Daring M.D.   On: 04/17/2024 18:53       Assessment & Plan:  Stress Assessment & Plan: Continues on zoloft . Has good support. Overall stable. Follow.    Type 2 diabetes mellitus with other circulatory complication, without long-term current use of insulin  (HCC) Assessment & Plan: Continue diet and exercise.  Follow met b and a1c. Low carb diet and exercise.   Reviewed outside sugars - most ranging 110-130s. Recent A1c improved - 6.2. Follow met b and A1c. No change in medication today.   Orders: -     HM Diabetes Foot Exam -     Basic metabolic panel with GFR; Future -     Hemoglobin A1c; Future  Hypercholesterolemia Assessment & Plan: Continue lipitor.  Low cholesterol diet and exercise.  Follow lipid panel. Discussed recent labs.  Lab Results  Component Value Date   CHOL 96 05/13/2024   HDL 31.40 (L) 05/13/2024   LDLCALC 37 05/13/2024   LDLDIRECT 72.0 10/08/2022   TRIG 139.0 05/13/2024   CHOLHDL 3 05/13/2024     Orders: -     Hepatic function panel; Future -     Basic metabolic panel with GFR; Future -     Lipid panel; Future -     CBC with Differential/Platelet;  Future -     TSH; Future  Paroxysmal atrial fibrillation [facility]) Assessment & Plan: Previously noted - isolated afib when sick in hospital.  He is on aspirin .  No increased heart rate or palpitations.  Breathing stable. Recent holter no afib. Has ILR. No afib to date. Continue aspirin .    Obstructive sleep apnea Assessment & Plan: Continue cpap.    Osteoarthritis, unspecified osteoarthritis type, unspecified site Assessment & Plan: Saw rheumatology 03/21/24 - polyarthralgia. Felt most of his pain due to OA and degenerative joint disease. Recommended celebrex . Xrays.    Primary hypertension Assessment & Plan: Continues on entrestro and spironolactone . Blood pressure doing well. Follow metabolic panel.    Gastroesophageal reflux disease, unspecified whether esophagitis present Assessment & Plan: No upper symptoms reported.  Conitnue protonix .     Cardiomyopathy, unspecified type [facility]) Assessment & Plan: Continues on entrestro and spironolactone . Breathing stable. Does not appear to be volume overloaded on exam today. Follow metabolic panel.    Coronary artery disease involving native coronary artery of native heart without angina pectoris Assessment & Plan: Continue aspirin , lipitor. Currently on entresto . No chest pain. Continue risk factor modification.    Anxiety Assessment & Plan: Continue zoloft . Has good support - wife. No change in medication.    Iron deficiency anemia, unspecified iron deficiency anemia type Assessment & Plan: Hgb decreased last check. Recheck cbc with next labs.    Epididymitis Assessment & Plan: Seeing urology. Recently treated with abx. Doing better now. No pain.    Other orders -     Carvedilol ; Take 1 tablet (25 mg total) by mouth 2 (two) times daily with a meal.  Dispense: 180 tablet; Refill: 1 -     Gabapentin ; Take 1 capsule (100 mg total) by mouth 3 (three) times daily.  Dispense: 90 capsule; Refill: 1 -     Levocetirizine  Dihydrochloride; Take 1 tablet (5 mg total) by mouth every evening.  Dispense: 90 tablet; Refill: 2 -     metFORMIN  HCl; Take 1 tablet (1,000 mg  total) by mouth 2 (two) times daily with a meal.  Dispense: 180 tablet; Refill: 1 -     Montelukast  Sodium; Take 1 tablet (10 mg total) by mouth at bedtime.  Dispense: 90 tablet; Refill: 2 -     Pantoprazole  Sodium; Take 1 tablet (40 mg total) by mouth daily.  Dispense: 90 tablet; Refill: 1 -     Pioglitazone  HCl; Take 1 tablet (15 mg total) by mouth daily.  Dispense: 90 tablet; Refill: 1 -     Sertraline  HCl; Take 1 tablet (100 mg total) by mouth daily.  Dispense: 90 tablet; Refill: 1 -     traZODone  HCl; Take 1 tablet (50 mg total) by mouth at bedtime as needed. for sleep  Dispense: 90 tablet; Refill: 1 -     Trulicity ; Inject 3 mg into the skin once a week.  Dispense: 6 mL; Refill: 1     Allena Hamilton, MD "

## 2024-05-22 ENCOUNTER — Encounter: Payer: Self-pay | Admitting: Internal Medicine

## 2024-05-22 DIAGNOSIS — N451 Epididymitis: Secondary | ICD-10-CM | POA: Insufficient documentation

## 2024-05-22 NOTE — Assessment & Plan Note (Signed)
 Previously noted - isolated afib when sick in hospital.  He is on aspirin .  No increased heart rate or palpitations.  Breathing stable. Recent holter no afib. Has ILR. No afib to date. Continue aspirin .

## 2024-05-22 NOTE — Assessment & Plan Note (Signed)
 Continues on zoloft . Has good support. Overall stable. Follow.

## 2024-05-22 NOTE — Assessment & Plan Note (Signed)
 Seeing urology. Recently treated with abx. Doing better now. No pain.

## 2024-05-22 NOTE — Assessment & Plan Note (Signed)
 Continue lipitor.  Low cholesterol diet and exercise.  Follow lipid panel. Discussed recent labs.  Lab Results  Component Value Date   CHOL 96 05/13/2024   HDL 31.40 (L) 05/13/2024   LDLCALC 37 05/13/2024   LDLDIRECT 72.0 10/08/2022   TRIG 139.0 05/13/2024   CHOLHDL 3 05/13/2024

## 2024-05-22 NOTE — Assessment & Plan Note (Signed)
 Hgb decreased last check. Recheck cbc with next labs.

## 2024-05-22 NOTE — Assessment & Plan Note (Signed)
-  Continue cpap

## 2024-05-22 NOTE — Assessment & Plan Note (Signed)
 Continue aspirin , lipitor. Currently on entresto . No chest pain. Continue risk factor modification.

## 2024-05-22 NOTE — Assessment & Plan Note (Signed)
 Continues on entrestro and spironolactone . Breathing stable. Does not appear to be volume overloaded on exam today. Follow metabolic panel.

## 2024-05-22 NOTE — Assessment & Plan Note (Signed)
 Continues on entrestro and spironolactone . Blood pressure doing well. Follow metabolic panel.

## 2024-05-22 NOTE — Assessment & Plan Note (Signed)
 No upper symptoms reported.  Conitnue protonix .

## 2024-05-22 NOTE — Assessment & Plan Note (Signed)
 Continue diet and exercise.  Follow met b and a1c. Low carb diet and exercise.   Reviewed outside sugars - most ranging 110-130s. Recent A1c improved - 6.2. Follow met b and A1c. No change in medication today.

## 2024-05-22 NOTE — Assessment & Plan Note (Signed)
 Continue zoloft . Has good support - wife. No change in medication.

## 2024-05-22 NOTE — Assessment & Plan Note (Signed)
 Saw rheumatology 03/21/24 - polyarthralgia. Felt most of his pain due to OA and degenerative joint disease. Recommended celebrex . Xrays.

## 2024-05-25 ENCOUNTER — Other Ambulatory Visit: Payer: Self-pay | Admitting: Cardiology

## 2024-05-25 ENCOUNTER — Encounter

## 2024-05-25 DIAGNOSIS — I480 Paroxysmal atrial fibrillation: Secondary | ICD-10-CM

## 2024-05-25 LAB — CUP PACEART REMOTE DEVICE CHECK
Date Time Interrogation Session: 20260114023200
Implantable Pulse Generator Implant Date: 20250225
Pulse Gen Serial Number: 135133

## 2024-05-29 ENCOUNTER — Ambulatory Visit: Payer: Self-pay | Admitting: Cardiology

## 2024-05-31 NOTE — Progress Notes (Signed)
 Remote Loop Recorder Transmission

## 2024-06-02 ENCOUNTER — Other Ambulatory Visit: Payer: Self-pay

## 2024-06-02 DIAGNOSIS — C61 Malignant neoplasm of prostate: Secondary | ICD-10-CM

## 2024-06-07 ENCOUNTER — Ambulatory Visit: Payer: Self-pay | Admitting: Urology

## 2024-06-16 ENCOUNTER — Other Ambulatory Visit

## 2024-06-16 DIAGNOSIS — C61 Malignant neoplasm of prostate: Secondary | ICD-10-CM

## 2024-06-17 ENCOUNTER — Ambulatory Visit: Payer: Self-pay | Admitting: Urology

## 2024-06-17 ENCOUNTER — Ambulatory Visit: Admitting: Cardiology

## 2024-06-17 VITALS — BP 140/62 | HR 76 | Ht 69.0 in | Wt 172.8 lb

## 2024-06-17 DIAGNOSIS — I4891 Unspecified atrial fibrillation: Secondary | ICD-10-CM

## 2024-06-17 DIAGNOSIS — I428 Other cardiomyopathies: Secondary | ICD-10-CM

## 2024-06-17 DIAGNOSIS — I10 Essential (primary) hypertension: Secondary | ICD-10-CM

## 2024-06-17 DIAGNOSIS — I2510 Atherosclerotic heart disease of native coronary artery without angina pectoris: Secondary | ICD-10-CM

## 2024-06-17 LAB — PSA: Prostate Specific Ag, Serum: 0.5 ng/mL (ref 0.0–4.0)

## 2024-06-17 NOTE — Patient Instructions (Signed)
 Medication Instructions:  Your physician recommends that you continue on your current medications as directed. Please refer to the Current Medication list given to you today.   *If you need a refill on your cardiac medications before your next appointment, please call your pharmacy*  Lab Work: Your provider would like for you to have following labs drawn today H&H.   If you have labs (blood work) drawn today and your tests are completely normal, you will receive your results only by: MyChart Message (if you have MyChart) OR A paper copy in the mail If you have any lab test that is abnormal or we need to change your treatment, we will call you to review the results.  Testing/Procedures:     [facility] [address] [place], [place] [address] Please take advantage of the free valet parking available at the [facility] and [employer] (Entrance C).  Proceed to the [facility] Radiology Department (First Floor) for check-in.   OR   [facility] [address] [place], [place] [address] Please go to the [facility] and check-in with the desk attendant.   Magnetic resonance imaging (MRI) is a painless test that produces images of the inside of the body without using Xrays.  During an MRI, strong magnets and radio waves work together in a data processing manager to form detailed images.   MRI images may provide more details about a medical condition than X-rays, CT scans, and ultrasounds can provide.  You may be given earphones to listen for instructions.  You may eat a light breakfast and take medications as ordered with the exception of furosemide , hydrochlorothiazide , chlorthalidone or spironolactone  (or any other fluid pill). If you are undergoing a stress MRI, please avoid stimulants for 12 hr prior to test. (I.e. Caffeine, nicotine, chocolate, or antihistamine medications)  If your provider has ordered anti-anxiety medications for this test,  then you will need a driver.  An IV will be inserted into one of your veins. Contrast material will be injected into your IV. It will leave your body through your urine within a day. You may be told to drink plenty of fluids to help flush the contrast material out of your system.  You will be asked to remove all metal, including: Watch, jewelry, and other metal objects including hearing aids, hair pieces and dentures. Also wearable glucose monitoring systems (ie. Freestyle Libre and Omnipods) (Braces and fillings normally are not a problem.)   TEST WILL TAKE APPROXIMATELY 1 HOUR  PLEASE NOTIFY SCHEDULING AT LEAST 24 HOURS IN ADVANCE IF YOU ARE UNABLE TO KEEP YOUR APPOINTMENT. [phone]  For more information and frequently asked questions, please visit our website : [email or web address]  Please call the Cardiac Imaging Nurse Navigators with any questions/concerns. [phone] Office    Follow-Up: At [facility], you and your health needs are our priority.  As part of our continuing mission to provide you with exceptional heart care, our providers are all part of one team.  This team includes your primary Cardiologist (physician) and Advanced Practice Providers or APPs (Physician Assistants and Nurse Practitioners) who all work together to provide you with the care you need, when you need it.  Your next appointment:   5 month(s)  Provider:   You may see Redell Cave, MD or one of the following Advanced Practice Providers on your designated Care Team:   Lonni Meager, NP Lesley Maffucci, PA-C Bernardino Bring, PA-C Cadence [place], PA-C Tylene Lunch, NP Barnie Hila,  NP    We recommend signing up for the patient portal called MyChart.  Sign up information is provided on this After Visit Summary.  MyChart is used to connect with patients for Virtual Visits (Telemedicine).  Patients are able to view lab/test results, encounter notes, upcoming appointments,  etc.  Non-urgent messages can be sent to your provider as well.   To learn more about what you can do with MyChart, go to [email or web address].

## 2024-06-17 NOTE — Progress Notes (Signed)
 " Cardiology Office Note:    Date:  06/17/2024   ID:  Ricardo Nash, Ricardo Nash [birth year], MRN [id number]  PCP:  Glendia Shad, MD   [facility] Providers Cardiologist:  Redell Cave, MD Electrophysiologist:  Fonda Kitty, MD     Referring MD: Glendia Shad, MD   Chief Complaint  Patient presents with   Follow-up    Doing ok, no issues today    History of Present Illness:    Ricardo Nash is a 79 y.o. male with a hx of Nonobstructive CAD ( LHC 05/2023- 55% mid LAD, 45% proximal Lcx), NICM EF 35%, hypertension, lone atrial fibrillation s/p ILR 2/25, diabetes who presents for follow-up.  Doing okay, denies chest pain or shortness of breath.  Denies edema.  Exercises 4 days weekly by walking on the treadmill.  Feels well having no cardiac concerns at this time.  CMR was previously ordered but patient canceled due to a prostate infection.  Compliant with all medications as prescribed.  Prior notes/testing Echo 05/2023 EF 35 to 40%. LHC 1/25, 50% mid LAD, 45% proximal LCx. Echo 06/2021 EF 40 to 45% reported.  Reviewed by myself roughly 50%.  Septal bounce, consistent with bundle branch block. Exercise MPI 12/2017 no evidence for ischemia.  Past Medical History:  Diagnosis Date   Anxiety    a.) on BZO (alprazolam ) PRN   Aortic atherosclerosis    Blood transfusion without reported diagnosis 1979   BPH with urinary obstruction    C. difficile colitis    CAD (coronary artery disease)    Cataract 2003   Cerebral microvascular disease    Coronary artery disease    DDD (degenerative disc disease), lumbar    a.) s/p lumbar decompression and fusion (L4-5); BILATERAL partial facetectomy and foraminotomies.   Depression    Diverticulitis    Environmental allergies    GERD (gastroesophageal reflux disease)    Goiter    intrathoracic, s/p benign biopsy (Dr Eletha)   HFrEF (heart failure with reduced ejection fraction) (HCC)    Hypercholesterolemia    Hypertension     Implantable loop recorder present 06/2023   Insomnia    a.) uses trazodone  PRN   LBBB (left bundle branch block)    Long-term use of aspirin  therapy    Male hypogonadism    a.) on exogenous TRT (depotestosterone cypionate)   NICM (nonischemic cardiomyopathy) (HCC)    NSVT (nonsustained ventricular tachycardia) (HCC)    OSA on CPAP    Osteoarthritis    cervical spine, lumbar spine   PAF (paroxysmal atrial fibrillation) (HCC) 2002   a.) Dx'd 2002 - likely lone A.fib per cardiology; b.) CHA2DS2VASc = 8 (age x 2, HFrEF,  HTN, CVA/TIA x 2, vascular disease, T2DM) as of 03/09/2024; c.) rate/rhythm maintained on oral carvedilol ; no chronic OAC   Pneumonia    PONV (postoperative nausea and vomiting)    PSVT (paroxysmal supraventricular tachycardia)    Sleep apnea    Status post bilateral cataract extraction    Stroke (HCC)    T2DM (type 2 diabetes mellitus) (HCC)    TIA (transient ischemic attack)     Past Surgical History:  Procedure Laterality Date   BACK SURGERY  12/2006   s/p fusion of L4-L5   BACK SURGERY  2024   capsule endoscopy     CATARACT EXTRACTION  2011   Dr. Cleatus   CERVICAL DISC SURGERY  2002   CHOLECYSTECTOMY  2024   COLONOSCOPY WITH PROPOFOL  N/A 07/20/2017  Procedure: COLONOSCOPY WITH PROPOFOL ;  Surgeon: Viktoria Lamar DASEN, MD;  Location: [facility] ENDOSCOPY;  Service: Endoscopy;  Laterality: N/A;   COLONOSCOPY WITH PROPOFOL  N/A 07/27/2023   Procedure: COLONOSCOPY WITH PROPOFOL ;  Surgeon: Maryruth Ole DASEN, MD;  Location: [facility] ENDOSCOPY;  Service: Endoscopy;  Laterality: N/A;   EYE SURGERY     cataract bilateral 10/1999   eyelid reduction     Dr. Gretta   gallbladder removed  12/2022   HOLEP-LASER ENUCLEATION OF THE PROSTATE WITH MORCELLATION N/A 03/14/2024   Procedure: ENUCLEATION, PROSTATE, USING LASER, WITH MORCELLATION;  Surgeon: Francisca Redell BROCKS, MD;  Location: [facility] ORS;  Service: Urology;  Laterality: N/A;   INGUINAL HERNIA REPAIR  1991   Dr, Claudene   LEFT  HEART CATH AND CORONARY ANGIOGRAPHY Left 06/02/2023   Procedure: LEFT HEART CATH AND CORONARY ANGIOGRAPHY;  Surgeon: Mady Bruckner, MD;  Location: [facility] INVASIVE CV LAB;  Service: Cardiovascular;  Laterality: Left;   LUMBAR LAMINECTOMY/DECOMPRESSION MICRODISCECTOMY N/A 10/15/2020   Procedure: Laminectomy - Lumbar Two-Lumbar Three - Lumbar Three-Lumbar Four with sublaminar decompression;  Surgeon: Joshua Alm RAMAN, MD;  Location: [facility] OR;  Service: Neurosurgery;  Laterality: N/A;  Laminectomy - Lumbar Two-Lumbar Three - Lumbar Three-Lumbar Four with sublaminar decompression   NOSE SURGERY     turbinate reduction   SEPTOPLASTY  1975   SHOULDER SURGERY  2000   rotator cuff   spinal stimulator     SPINE SURGERY  2002 2008 2012    Current Medications: Current Meds  Medication Sig   ACCU-CHEK GUIDE TEST test strip USE AS DIRECTED TO TEST BLOOD GLUCOSE LEVELS TWICE DAILY   ALPRAZolam  (XANAX ) 0.25 MG tablet TAKE 1 TABLET BY MOUTH ONCE DAILY AS NEEDED   aspirin  EC 81 MG tablet Take 81 mg by mouth every evening. Swallow whole.   atorvastatin  (LIPITOR) 40 MG tablet TAKE 1 TABLET BY MOUTH ONCE EVERY EVENING   azelastine  (OPTIVAR ) 0.05 % ophthalmic solution Place 1 drop into both eyes daily.   Azelastine -Fluticasone  137-50 MCG/ACT SUSP PLACE 1 SPRAY INTO EACH NOSTRIL ONCE EVERY MORNING AND AT BEDTIME   blood glucose meter kit and supplies Dispense based on patient and insurance preference. Use up to four times daily as directed. (FOR ICD-10 E10.9, E11.9).   carvedilol  (COREG ) 25 MG tablet Take 1 tablet (25 mg total) by mouth 2 (two) times daily with a meal.   celecoxib  (CELEBREX ) 100 MG capsule Take 100 mg by mouth 2 (two) times daily.   Dulaglutide  (TRULICITY ) 3 MG/0.5ML SOAJ Inject 3 mg into the skin once a week.   empagliflozin  (JARDIANCE ) 10 MG TABS tablet Take 1 tablet (10 mg total) by mouth daily before breakfast.   fluorometholone (FML) 0.1 % ophthalmic suspension Place 1 drop into both eyes  daily.   gabapentin  (NEURONTIN ) 100 MG capsule Take 1 capsule (100 mg total) by mouth 3 (three) times daily.   levocetirizine (XYZAL ) 5 MG tablet Take 1 tablet (5 mg total) by mouth every evening.   metFORMIN  (GLUCOPHAGE ) 1000 MG tablet Take 1 tablet (1,000 mg total) by mouth 2 (two) times daily with a meal.   montelukast  (SINGULAIR ) 10 MG tablet Take 1 tablet (10 mg total) by mouth at bedtime.   Multiple Vitamin (MULTIVITAMIN WITH MINERALS) TABS tablet Take 1 tablet by mouth in the morning.   pantoprazole  (PROTONIX ) 40 MG tablet Take 1 tablet (40 mg total) by mouth daily.   pioglitazone  (ACTOS ) 15 MG tablet Take 1 tablet (15 mg total) by mouth daily.   sacubitril -valsartan  (  ENTRESTO ) 49-51 MG Take 1 tablet by mouth 2 (two) times daily.   sertraline  (ZOLOFT ) 100 MG tablet Take 1 tablet (100 mg total) by mouth daily.   spironolactone  (ALDACTONE ) 25 MG tablet TAKE 1 TABLET BY MOUTH ONCE DAILY   testosterone  cypionate (DEPOTESTOSTERONE CYPIONATE) 200 MG/ML injection INJECT 0.5MLS INTRAMUSCULARLY EVERY 14 DAYS   traZODone  (DESYREL ) 50 MG tablet Take 1 tablet (50 mg total) by mouth at bedtime as needed. for sleep     Allergies:   Penicillins and Penicillin g   Social History   Socioeconomic History   Marital status: Married    Spouse name: Not on file   Number of children: 2   Years of education: Not on file   Highest education level: Not on file  Occupational History   Occupation: retired runner, broadcasting/film/video  Tobacco Use   Smoking status: Former    Passive exposure: Never   Smokeless tobacco: Never  Vaping Use   Vaping status: Never Used  Substance and Sexual Activity   Alcohol use: Yes    Alcohol/week: 0.0 standard drinks of alcohol    Comment: occasional   Drug use: No   Sexual activity: Not Currently  Other Topics Concern   Not on file  Social History Narrative   Not on file   Social Drivers of Health   Tobacco Use: Medium Risk (05/22/2024)   Patient History    Smoking Tobacco Use:  Former    Smokeless Tobacco Use: Never    Passive Exposure: Never  Physicist, Medical Strain: Low Risk  (04/20/2024)   Received from [facility]   Overall Financial Resource Strain (CARDIA)    Difficulty of Paying Living Expenses: Not hard at all  Food Insecurity: No Food Insecurity (04/20/2024)   Received from [facility]   Epic    Within the past 12 months, you worried that your food would run out before you got the money to buy more.: Never true    Within the past 12 months, the food you bought just didn't last and you didn't have money to get more.: Never true  Transportation Needs: No Transportation Needs (04/20/2024)   Received from [facility] - Transportation    In the past 12 months, has lack of transportation kept you from medical appointments or from getting medications?: No    Lack of Transportation (Non-Medical): No  Physical Activity: Inactive (07/01/2023)   Exercise Vital Sign    Days of Exercise per Week: 0 days    Minutes of Exercise per Session: 0 min  Stress: No Stress Concern Present (07/01/2023)   [employer] of Occupational [employer] - Occupational Stress Questionnaire    Feeling of Stress : Only a little  Social Connections: Socially Integrated (07/01/2023)   Social Connection and Isolation Panel    Frequency of Communication with Friends and Family: More than three times a week    Frequency of Social Gatherings with Friends and Family: Three times a week    Attends Religious Services: More than 4 times per year    Active Member of Clubs or Organizations: Yes    Attends Banker Meetings: More than 4 times per year    Marital Status: Married  Depression (PHQ2-9): Low Risk (05/17/2024)   Depression (PHQ2-9)    PHQ-2 Score: 4  Alcohol Screen: Low Risk (07/01/2023)   Alcohol Screen    Last Alcohol Screening Score (AUDIT): 1  Housing: Low Risk  (04/20/2024)  [facility]   Epic    In the last 12 months, was there a time when you were not able to pay the mortgage or rent on time?: No    In the past 12 months, how many times have you moved where you were living?: 0    At any time in the past 12 months, were you homeless or living in a shelter (including now)?: No  Utilities: Not At Risk (04/20/2024)   Received from [facility]   Epic    In the past 12 months has the electric, gas, oil, or water  company threatened to shut off services in your home?: No  Health Literacy: Adequate Health Literacy (07/01/2023)   B1300 Health Literacy    Frequency of need for help with medical instructions: Never     Family History: The patient's family history includes Arthritis in his father and mother; Congestive Heart Failure in his father; Heart disease in his father and mother; Kidney disease in his sister; Rheumatic fever in his father. There is no history of Colon cancer or Prostate cancer.  ROS:   Please see the history of present illness.     All other systems reviewed and are negative.  EKGs/Labs/Other Studies Reviewed:    The following studies were reviewed today:       Recent Labs: 09/30/2023: TSH 0.71 04/17/2024: Hemoglobin 11.4; Platelets 318 05/13/2024: ALT 13; BUN 16; Creatinine, Ser 0.74; Potassium 4.5; Sodium 142  Recent Lipid Panel    Component Value Date/Time   CHOL 96 05/13/2024 0909   CHOL 117 05/26/2023 1620   TRIG 139.0 05/13/2024 0909   HDL 31.40 (L) 05/13/2024 0909   HDL 37 (L) 05/26/2023 1620   CHOLHDL 3 05/13/2024 0909   VLDL 27.8 05/13/2024 0909   LDLCALC 37 05/13/2024 0909   LDLCALC 49 05/26/2023 1620   LDLDIRECT 72.0 10/08/2022 0745     Risk Assessment/Calculations:            Physical Exam:    VS:  BP (!) 140/62 (BP Location: Left Arm, Patient Position: Sitting, Cuff Size: Normal)   Pulse 76   Ht 5' 9 (1.753 m)   Wt 172 lb 12.8 oz (78.4 kg)   SpO2 97%   BMI 25.52 kg/m     Wt  Readings from Last 3 Encounters:  06/17/24 172 lb 12.8 oz (78.4 kg)  05/17/24 168 lb 6.4 oz (76.4 kg)  04/29/24 164 lb 11.2 oz (74.7 kg)     GEN:  Well nourished, well developed in no acute distress HEENT: Normal NECK: No JVD; No carotid bruits CARDIAC: RRR, no murmurs, rubs, gallops RESPIRATORY:  Clear to auscultation without rales, wheezing or rhonchi  ABDOMEN: Soft, non-tender, non-distended MUSCULOSKELETAL:  No edema; No deformity  SKIN: Warm and dry NEUROLOGIC:  Alert and oriented x 3 PSYCHIATRIC:  Normal affect   ASSESSMENT:    1. Lone atrial fibrillation (HCC)   2. NICM (nonischemic cardiomyopathy) (HCC)   3. Coronary artery disease involving native coronary artery of native heart, unspecified whether angina present   4. Primary hypertension    PLAN:    In order of problems listed above:  Atrial fibrillation, diagnosed in 2002.  Likely lone A-fib.  Has ILR in place, implanted 06/2023 no A-fib or flutter so far.  Continue Coreg , aspirin  81 mg daily. Nonischemic cardiomyopathy EF 35%. Etiology possible left bundle branch block.  NYHA class II symptoms.  Appears euvolemic. Continue Coreg  25 mg twice  daily, Entresto  49-51 mg twice daily, spironolactone  25 mg daily, Jardiance  10 mg daily.  Obtain CMR. Nonobstructive CAD (55% mid LAD, 45% proximal Lcx), denies chest pain.  Continue aspirin  81 mg daily, Lipitor 40 mg daily. Hypertension, BP controlled.  Continue Coreg  25 mg twice daily, Maxzide , lisinopril .  Follow-up in 5-6 months      Medication Adjustments/Labs and Tests Ordered: Current medicines are reviewed at length with the patient today.  Concerns regarding medicines are outlined above.  Orders Placed This Encounter  Procedures   MR CARDIAC MORPHOLOGY W WO CONTRAST   Hemoglobin and hematocrit, blood   No orders of the defined types were placed in this encounter.   Patient Instructions  Medication Instructions:  Your physician recommends that you continue on  your current medications as directed. Please refer to the Current Medication list given to you today.   *If you need a refill on your cardiac medications before your next appointment, please call your pharmacy*  Lab Work: Your provider would like for you to have following labs drawn today H&H.   If you have labs (blood work) drawn today and your tests are completely normal, you will receive your results only by: MyChart Message (if you have MyChart) OR A paper copy in the mail If you have any lab test that is abnormal or we need to change your treatment, we will call you to review the results.  Testing/Procedures:     [facility] [address] [place], [place] [address] Please take advantage of the free valet parking available at the [facility] and [employer] (Entrance C).  Proceed to the [facility] Radiology Department (First Floor) for check-in.   OR   [facility] [address] [place], [place] [address] Please go to the [facility] and check-in with the desk attendant.   Magnetic resonance imaging (MRI) is a painless test that produces images of the inside of the body without using Xrays.  During an MRI, strong magnets and radio waves work together in a data processing manager to form detailed images.   MRI images may provide more details about a medical condition than X-rays, CT scans, and ultrasounds can provide.  You may be given earphones to listen for instructions.  You may eat a light breakfast and take medications as ordered with the exception of furosemide , hydrochlorothiazide , chlorthalidone or spironolactone  (or any other fluid pill). If you are undergoing a stress MRI, please avoid stimulants for 12 hr prior to test. (I.e. Caffeine, nicotine, chocolate, or antihistamine medications)  If your provider has ordered anti-anxiety medications for this test, then you will need a driver.  An IV will be inserted into one of your  veins. Contrast material will be injected into your IV. It will leave your body through your urine within a day. You may be told to drink plenty of fluids to help flush the contrast material out of your system.  You will be asked to remove all metal, including: Watch, jewelry, and other metal objects including hearing aids, hair pieces and dentures. Also wearable glucose monitoring systems (ie. Freestyle Libre and Omnipods) (Braces and fillings normally are not a problem.)   TEST WILL TAKE APPROXIMATELY 1 HOUR  PLEASE NOTIFY SCHEDULING AT LEAST 24 HOURS IN ADVANCE IF YOU ARE UNABLE TO KEEP YOUR APPOINTMENT. [phone]  For more information and frequently asked questions, please visit our website : [email or web address]  Please call the Cardiac Imaging Nurse Navigators with any questions/concerns. [phone] Office  Follow-Up: At [facility], you and your health needs are our priority.  As part of our continuing mission to provide you with exceptional heart care, our providers are all part of one team.  This team includes your primary Cardiologist (physician) and Advanced Practice Providers or APPs (Physician Assistants and Nurse Practitioners) who all work together to provide you with the care you need, when you need it.  Your next appointment:   5 month(s)  Provider:   You may see Redell Cave, MD or one of the following Advanced Practice Providers on your designated Care Team:   Lonni Meager, NP Lesley Maffucci, PA-C Bernardino Bring, PA-C Cadence [place], PA-C Tylene Lunch, NP Barnie Hila, NP    We recommend signing up for the patient portal called MyChart.  Sign up information is provided on this After Visit Summary.  MyChart is used to connect with patients for Virtual Visits (Telemedicine).  Patients are able to view lab/test results, encounter notes, upcoming appointments, etc.  Non-urgent messages can be sent to your provider as well.   To  learn more about what you can do with MyChart, go to [email or web address].               Signed, Redell Cave, MD  06/17/2024 9:59 AM    [facility] "

## 2024-06-25 ENCOUNTER — Encounter

## 2024-06-27 ENCOUNTER — Ambulatory Visit

## 2024-07-04 ENCOUNTER — Ambulatory Visit: Payer: Medicare PPO

## 2024-07-06 ENCOUNTER — Ambulatory Visit: Admitting: Urology

## 2024-07-13 ENCOUNTER — Ambulatory Visit: Admitting: Cardiology

## 2024-07-26 ENCOUNTER — Encounter

## 2024-08-26 ENCOUNTER — Encounter

## 2024-09-14 ENCOUNTER — Other Ambulatory Visit

## 2024-09-16 ENCOUNTER — Ambulatory Visit: Admitting: Internal Medicine

## 2024-09-26 ENCOUNTER — Encounter

## 2024-10-27 ENCOUNTER — Encounter

## 2024-11-01 ENCOUNTER — Ambulatory Visit: Admitting: Cardiology

## 2024-11-27 ENCOUNTER — Encounter

## 2024-12-28 ENCOUNTER — Encounter

## 2025-01-28 ENCOUNTER — Encounter

## 2025-02-28 ENCOUNTER — Encounter

## 2025-03-31 ENCOUNTER — Encounter

## 2025-05-01 ENCOUNTER — Encounter
